# Patient Record
Sex: Female | Born: 1975 | Race: Black or African American | Hispanic: No | Marital: Single | Smoking: Former smoker
Health system: Southern US, Community
[De-identification: ages and names within clinical notes are randomized; demographics above are authoritative.]

## PROBLEM LIST (undated history)

## (undated) DIAGNOSIS — I499 Cardiac arrhythmia, unspecified: Secondary | ICD-10-CM

## (undated) DIAGNOSIS — C539 Malignant neoplasm of cervix uteri, unspecified: Secondary | ICD-10-CM

## (undated) DIAGNOSIS — D649 Anemia, unspecified: Secondary | ICD-10-CM

## (undated) DIAGNOSIS — I10 Essential (primary) hypertension: Secondary | ICD-10-CM

## (undated) DIAGNOSIS — I428 Other cardiomyopathies: Secondary | ICD-10-CM

## (undated) DIAGNOSIS — IMO0001 Reserved for inherently not codable concepts without codable children: Secondary | ICD-10-CM

## (undated) DIAGNOSIS — Z9189 Other specified personal risk factors, not elsewhere classified: Secondary | ICD-10-CM

## (undated) DIAGNOSIS — I509 Heart failure, unspecified: Secondary | ICD-10-CM

## (undated) DIAGNOSIS — IMO0002 Reserved for concepts with insufficient information to code with codable children: Secondary | ICD-10-CM

## (undated) DIAGNOSIS — Z9289 Personal history of other medical treatment: Secondary | ICD-10-CM

## (undated) HISTORY — PX: OTHER SURGICAL HISTORY: SHX169

## (undated) HISTORY — DX: Reserved for concepts with insufficient information to code with codable children: IMO0002

## (undated) HISTORY — DX: Reserved for inherently not codable concepts without codable children: IMO0001

---

## 2004-09-23 HISTORY — PX: CHOLECYSTECTOMY: SHX55

## 2006-08-25 ENCOUNTER — Emergency Department (HOSPITAL_COMMUNITY): Admission: EM | Admit: 2006-08-25 | Discharge: 2006-08-25 | Payer: Self-pay | Admitting: Family Medicine

## 2006-08-29 ENCOUNTER — Inpatient Hospital Stay (HOSPITAL_COMMUNITY): Admission: AD | Admit: 2006-08-29 | Discharge: 2006-08-29 | Payer: Self-pay | Admitting: Obstetrics & Gynecology

## 2006-10-03 ENCOUNTER — Inpatient Hospital Stay (HOSPITAL_COMMUNITY): Admission: AD | Admit: 2006-10-03 | Discharge: 2006-10-03 | Payer: Self-pay | Admitting: Gynecology

## 2006-11-14 ENCOUNTER — Ambulatory Visit (HOSPITAL_COMMUNITY): Admission: RE | Admit: 2006-11-14 | Discharge: 2006-11-14 | Payer: Self-pay | Admitting: Obstetrics

## 2006-12-06 ENCOUNTER — Inpatient Hospital Stay (HOSPITAL_COMMUNITY): Admission: AD | Admit: 2006-12-06 | Discharge: 2006-12-06 | Payer: Self-pay | Admitting: Obstetrics

## 2006-12-11 ENCOUNTER — Emergency Department (HOSPITAL_COMMUNITY): Admission: EM | Admit: 2006-12-11 | Discharge: 2006-12-11 | Payer: Self-pay | Admitting: Family Medicine

## 2007-01-23 ENCOUNTER — Ambulatory Visit (HOSPITAL_COMMUNITY): Admission: RE | Admit: 2007-01-23 | Discharge: 2007-01-23 | Payer: Self-pay | Admitting: Obstetrics

## 2007-02-09 ENCOUNTER — Inpatient Hospital Stay (HOSPITAL_COMMUNITY): Admission: AD | Admit: 2007-02-09 | Discharge: 2007-02-09 | Payer: Self-pay | Admitting: Obstetrics

## 2007-03-13 ENCOUNTER — Inpatient Hospital Stay (HOSPITAL_COMMUNITY): Admission: AD | Admit: 2007-03-13 | Discharge: 2007-03-13 | Payer: Self-pay | Admitting: Obstetrics & Gynecology

## 2007-03-15 ENCOUNTER — Inpatient Hospital Stay (HOSPITAL_COMMUNITY): Admission: AD | Admit: 2007-03-15 | Discharge: 2007-03-15 | Payer: Self-pay | Admitting: Obstetrics

## 2007-03-20 ENCOUNTER — Inpatient Hospital Stay (HOSPITAL_COMMUNITY): Admission: AD | Admit: 2007-03-20 | Discharge: 2007-03-22 | Payer: Self-pay | Admitting: Obstetrics & Gynecology

## 2008-01-10 ENCOUNTER — Emergency Department (HOSPITAL_COMMUNITY): Admission: EM | Admit: 2008-01-10 | Discharge: 2008-01-10 | Payer: Self-pay | Admitting: Family Medicine

## 2008-04-21 ENCOUNTER — Ambulatory Visit (HOSPITAL_COMMUNITY): Admission: RE | Admit: 2008-04-21 | Discharge: 2008-04-21 | Payer: Self-pay | Admitting: Obstetrics

## 2008-08-05 ENCOUNTER — Inpatient Hospital Stay (HOSPITAL_COMMUNITY): Admission: AD | Admit: 2008-08-05 | Discharge: 2008-08-08 | Payer: Self-pay | Admitting: Obstetrics

## 2008-08-07 ENCOUNTER — Encounter: Payer: Self-pay | Admitting: Obstetrics

## 2008-08-15 ENCOUNTER — Inpatient Hospital Stay (HOSPITAL_COMMUNITY): Admission: AD | Admit: 2008-08-15 | Discharge: 2008-08-18 | Payer: Self-pay | Admitting: Obstetrics

## 2010-05-11 ENCOUNTER — Emergency Department (HOSPITAL_COMMUNITY): Admission: EM | Admit: 2010-05-11 | Discharge: 2010-05-11 | Payer: Self-pay | Admitting: Family Medicine

## 2010-06-25 ENCOUNTER — Emergency Department (HOSPITAL_COMMUNITY): Admission: EM | Admit: 2010-06-25 | Discharge: 2010-06-26 | Payer: Self-pay | Admitting: Emergency Medicine

## 2010-07-16 ENCOUNTER — Ambulatory Visit (HOSPITAL_COMMUNITY): Admission: RE | Admit: 2010-07-16 | Discharge: 2010-07-16 | Payer: Self-pay | Admitting: Obstetrics

## 2010-07-19 ENCOUNTER — Other Ambulatory Visit: Payer: Self-pay | Admitting: Emergency Medicine

## 2010-07-20 ENCOUNTER — Inpatient Hospital Stay (HOSPITAL_COMMUNITY): Admission: AD | Admit: 2010-07-20 | Discharge: 2010-07-23 | Payer: Self-pay | Admitting: Obstetrics

## 2010-07-26 ENCOUNTER — Ambulatory Visit: Admission: RE | Admit: 2010-07-26 | Discharge: 2010-07-26 | Payer: Self-pay | Admitting: Gynecologic Oncology

## 2010-07-29 ENCOUNTER — Emergency Department (HOSPITAL_COMMUNITY): Admission: EM | Admit: 2010-07-29 | Discharge: 2010-07-29 | Payer: Self-pay | Admitting: Emergency Medicine

## 2010-07-31 ENCOUNTER — Ambulatory Visit
Admission: RE | Admit: 2010-07-31 | Discharge: 2010-10-15 | Payer: Self-pay | Source: Home / Self Care | Attending: Radiation Oncology | Admitting: Radiation Oncology

## 2010-08-06 ENCOUNTER — Ambulatory Visit (HOSPITAL_COMMUNITY)
Admission: RE | Admit: 2010-08-06 | Discharge: 2010-08-06 | Payer: Self-pay | Source: Home / Self Care | Admitting: Radiation Oncology

## 2010-08-10 ENCOUNTER — Other Ambulatory Visit: Payer: Self-pay | Admitting: Oncology

## 2010-08-10 LAB — CBC WITH DIFFERENTIAL/PLATELET
Basophils Absolute: 0 10*3/uL (ref 0.0–0.1)
Eosinophils Absolute: 0.3 10*3/uL (ref 0.0–0.5)
HGB: 9.3 g/dL — ABNORMAL LOW (ref 11.6–15.9)
LYMPH%: 23.7 % (ref 14.0–49.7)
MCV: 86.8 fL (ref 79.5–101.0)
MONO#: 1.3 10*3/uL — ABNORMAL HIGH (ref 0.1–0.9)
MONO%: 10.2 % (ref 0.0–14.0)
NEUT#: 8.1 10*3/uL — ABNORMAL HIGH (ref 1.5–6.5)
Platelets: 430 10*3/uL — ABNORMAL HIGH (ref 145–400)
RBC: 3.3 10*6/uL — ABNORMAL LOW (ref 3.70–5.45)
WBC: 12.9 10*3/uL — ABNORMAL HIGH (ref 3.9–10.3)

## 2010-08-10 LAB — COMPREHENSIVE METABOLIC PANEL
Albumin: 4.3 g/dL (ref 3.5–5.2)
Alkaline Phosphatase: 75 U/L (ref 39–117)
BUN: 7 mg/dL (ref 6–23)
CO2: 25 mEq/L (ref 19–32)
Glucose, Bld: 86 mg/dL (ref 70–99)
Potassium: 4.2 mEq/L (ref 3.5–5.3)
Sodium: 136 mEq/L (ref 135–145)
Total Protein: 7.3 g/dL (ref 6.0–8.3)

## 2010-08-10 LAB — MAGNESIUM: Magnesium: 2 mg/dL (ref 1.5–2.5)

## 2010-08-20 ENCOUNTER — Other Ambulatory Visit: Payer: Self-pay | Admitting: Oncology

## 2010-08-20 LAB — CBC WITH DIFFERENTIAL/PLATELET
BASO%: 0.6 % (ref 0.0–2.0)
Eosinophils Absolute: 0.6 10*3/uL — ABNORMAL HIGH (ref 0.0–0.5)
MCHC: 32 g/dL (ref 31.5–36.0)
MONO#: 0.6 10*3/uL (ref 0.1–0.9)
NEUT#: 8.2 10*3/uL — ABNORMAL HIGH (ref 1.5–6.5)
RBC: 3.26 10*6/uL — ABNORMAL LOW (ref 3.70–5.45)
RDW: 16.2 % — ABNORMAL HIGH (ref 11.2–14.5)
WBC: 10.5 10*3/uL — ABNORMAL HIGH (ref 3.9–10.3)
lymph#: 1 10*3/uL (ref 0.9–3.3)

## 2010-08-22 ENCOUNTER — Other Ambulatory Visit: Payer: Self-pay | Admitting: Oncology

## 2010-08-22 ENCOUNTER — Encounter (HOSPITAL_COMMUNITY)
Admission: RE | Admit: 2010-08-22 | Discharge: 2010-09-21 | Payer: Self-pay | Source: Home / Self Care | Attending: Oncology | Admitting: Oncology

## 2010-08-22 LAB — BASIC METABOLIC PANEL
CO2: 24 mEq/L (ref 19–32)
Calcium: 10.1 mg/dL (ref 8.4–10.5)
Chloride: 102 mEq/L (ref 96–112)
Glucose, Bld: 94 mg/dL (ref 70–99)
Potassium: 3.9 mEq/L (ref 3.5–5.3)
Sodium: 141 mEq/L (ref 135–145)

## 2010-08-23 LAB — TYPE & CROSSMATCH - CHCC

## 2010-08-24 LAB — URINALYSIS, MICROSCOPIC - CHCC
Bilirubin (Urine): NEGATIVE
Glucose: NEGATIVE g/dL
Ketones: NEGATIVE mg/dL
Nitrite: NEGATIVE
Protein: NEGATIVE mg/dL
Specific Gravity, Urine: 1.005 (ref 1.003–1.035)
pH: 8 (ref 4.6–8.0)

## 2010-08-26 LAB — URINE CULTURE

## 2010-08-29 ENCOUNTER — Ambulatory Visit: Payer: Self-pay | Admitting: Oncology

## 2010-08-29 LAB — CBC WITH DIFFERENTIAL/PLATELET
BASO%: 0.4 % (ref 0.0–2.0)
Basophils Absolute: 0.1 10*3/uL (ref 0.0–0.1)
EOS%: 13.3 % — ABNORMAL HIGH (ref 0.0–7.0)
Eosinophils Absolute: 1.5 10*3/uL — ABNORMAL HIGH (ref 0.0–0.5)
HCT: 30.7 % — ABNORMAL LOW (ref 34.8–46.6)
HGB: 10.2 g/dL — ABNORMAL LOW (ref 11.6–15.9)
LYMPH%: 4.5 % — ABNORMAL LOW (ref 14.0–49.7)
MCH: 28.2 pg (ref 25.1–34.0)
MCHC: 33.2 g/dL (ref 31.5–36.0)
MCV: 84.8 fL (ref 79.5–101.0)
MONO#: 0.8 10*3/uL (ref 0.1–0.9)
MONO%: 6.6 % (ref 0.0–14.0)
NEUT#: 8.6 10*3/uL — ABNORMAL HIGH (ref 1.5–6.5)
NEUT%: 75.2 % (ref 38.4–76.8)
Platelets: 353 10*3/uL (ref 145–400)
RBC: 3.62 10*6/uL — ABNORMAL LOW (ref 3.70–5.45)
RDW: 16.6 % — ABNORMAL HIGH (ref 11.2–14.5)
WBC: 11.4 10*3/uL — ABNORMAL HIGH (ref 3.9–10.3)
lymph#: 0.5 10*3/uL — ABNORMAL LOW (ref 0.9–3.3)

## 2010-08-31 LAB — BASIC METABOLIC PANEL
BUN: 9 mg/dL (ref 6–23)
CO2: 22 mEq/L (ref 19–32)
Calcium: 10.1 mg/dL (ref 8.4–10.5)
Chloride: 106 mEq/L (ref 96–112)
Creatinine, Ser: 0.72 mg/dL (ref 0.40–1.20)
Glucose, Bld: 95 mg/dL (ref 70–99)
Potassium: 3.8 mEq/L (ref 3.5–5.3)
Sodium: 139 mEq/L (ref 135–145)

## 2010-08-31 LAB — MAGNESIUM: Magnesium: 1.7 mg/dL (ref 1.5–2.5)

## 2010-09-03 LAB — CBC WITH DIFFERENTIAL/PLATELET
BASO%: 0.8 % (ref 0.0–2.0)
Basophils Absolute: 0.1 10*3/uL (ref 0.0–0.1)
EOS%: 17.4 % — ABNORMAL HIGH (ref 0.0–7.0)
Eosinophils Absolute: 1.6 10*3/uL — ABNORMAL HIGH (ref 0.0–0.5)
HCT: 28.3 % — ABNORMAL LOW (ref 34.8–46.6)
HGB: 9.4 g/dL — ABNORMAL LOW (ref 11.6–15.9)
LYMPH%: 7.6 % — ABNORMAL LOW (ref 14.0–49.7)
MCH: 28 pg (ref 25.1–34.0)
MCHC: 33.2 g/dL (ref 31.5–36.0)
MCV: 84.2 fL (ref 79.5–101.0)
MONO#: 0.7 10*3/uL (ref 0.1–0.9)
MONO%: 7.8 % (ref 0.0–14.0)
NEUT#: 6 10*3/uL (ref 1.5–6.5)
NEUT%: 66.4 % (ref 38.4–76.8)
Platelets: 317 10*3/uL (ref 145–400)
RBC: 3.36 10*6/uL — ABNORMAL LOW (ref 3.70–5.45)
RDW: 16 % — ABNORMAL HIGH (ref 11.2–14.5)
WBC: 9.1 10*3/uL (ref 3.9–10.3)
lymph#: 0.7 10*3/uL — ABNORMAL LOW (ref 0.9–3.3)

## 2010-09-07 LAB — CBC WITH DIFFERENTIAL/PLATELET
Basophils Absolute: 0.1 10*3/uL (ref 0.0–0.1)
Eosinophils Absolute: 0.9 10*3/uL — ABNORMAL HIGH (ref 0.0–0.5)
HGB: 9.5 g/dL — ABNORMAL LOW (ref 11.6–15.9)
MCV: 88.6 fL (ref 79.5–101.0)
MONO#: 0.6 10*3/uL (ref 0.1–0.9)
MONO%: 8.5 % (ref 0.0–14.0)
NEUT#: 5.2 10*3/uL (ref 1.5–6.5)
Platelets: 344 10*3/uL (ref 145–400)
RDW: 18 % — ABNORMAL HIGH (ref 11.2–14.5)

## 2010-09-07 LAB — BASIC METABOLIC PANEL
CO2: 25 mEq/L (ref 19–32)
Calcium: 10.1 mg/dL (ref 8.4–10.5)
Chloride: 104 mEq/L (ref 96–112)
Sodium: 137 mEq/L (ref 135–145)

## 2010-09-10 LAB — CBC WITH DIFFERENTIAL/PLATELET
BASO%: 0.7 % (ref 0.0–2.0)
Basophils Absolute: 0.1 10*3/uL (ref 0.0–0.1)
Eosinophils Absolute: 0.5 10*3/uL (ref 0.0–0.5)
HCT: 30.2 % — ABNORMAL LOW (ref 34.8–46.6)
HGB: 9.9 g/dL — ABNORMAL LOW (ref 11.6–15.9)
LYMPH%: 10 % — ABNORMAL LOW (ref 14.0–49.7)
MCHC: 32.8 g/dL (ref 31.5–36.0)
MONO#: 0.3 10*3/uL (ref 0.1–0.9)
NEUT%: 77.8 % — ABNORMAL HIGH (ref 38.4–76.8)
Platelets: 292 10*3/uL (ref 145–400)
WBC: 6.8 10*3/uL (ref 3.9–10.3)

## 2010-09-19 LAB — CBC WITH DIFFERENTIAL/PLATELET
Basophils Absolute: 0 10*3/uL (ref 0.0–0.1)
Eosinophils Absolute: 0.1 10*3/uL (ref 0.0–0.5)
HGB: 11.8 g/dL (ref 11.6–15.9)
LYMPH%: 12.7 % — ABNORMAL LOW (ref 14.0–49.7)
MCV: 87.7 fL (ref 79.5–101.0)
MONO%: 11.1 % (ref 0.0–14.0)
NEUT#: 3.1 10*3/uL (ref 1.5–6.5)
NEUT%: 73.6 % (ref 38.4–76.8)
Platelets: 200 10*3/uL (ref 145–400)

## 2010-09-19 LAB — MAGNESIUM: Magnesium: 1.8 mg/dL (ref 1.5–2.5)

## 2010-09-19 LAB — COMPREHENSIVE METABOLIC PANEL
CO2: 23 mEq/L (ref 19–32)
Creatinine, Ser: 0.67 mg/dL (ref 0.40–1.20)
Glucose, Bld: 85 mg/dL (ref 70–99)
Total Bilirubin: 0.2 mg/dL — ABNORMAL LOW (ref 0.3–1.2)

## 2010-09-27 LAB — TYPE & CROSSMATCH - CHCC

## 2010-10-02 ENCOUNTER — Inpatient Hospital Stay (HOSPITAL_COMMUNITY)
Admission: AD | Admit: 2010-10-02 | Discharge: 2010-10-05 | Payer: Self-pay | Source: Home / Self Care | Attending: Radiation Oncology | Admitting: Radiation Oncology

## 2010-10-02 ENCOUNTER — Ambulatory Visit
Admission: RE | Admit: 2010-10-02 | Discharge: 2010-10-02 | Payer: Self-pay | Source: Home / Self Care | Attending: Radiation Oncology | Admitting: Radiation Oncology

## 2010-10-03 ENCOUNTER — Ambulatory Visit: Payer: Self-pay | Admitting: Oncology

## 2010-10-08 LAB — COMPREHENSIVE METABOLIC PANEL
ALT: 9 U/L (ref 0–35)
ALT: 8 U/L (ref 0–35)
AST: 14 U/L (ref 0–37)
AST: 11 U/L (ref 0–37)
Albumin: 4.1 g/dL (ref 3.5–5.2)
Albumin: 4 g/dL (ref 3.5–5.2)
Alkaline Phosphatase: 79 U/L (ref 39–117)
Alkaline Phosphatase: 59 U/L (ref 39–117)
BUN: 4 mg/dL — ABNORMAL LOW (ref 6–23)
BUN: 6 mg/dL (ref 6–23)
CO2: 23 mEq/L (ref 19–32)
CO2: 25 mEq/L (ref 19–32)
Calcium: 10.6 mg/dL — ABNORMAL HIGH (ref 8.4–10.5)
Calcium: 10.1 mg/dL (ref 8.4–10.5)
Chloride: 108 mEq/L (ref 96–112)
Chloride: 104 mEq/L (ref 96–112)
Creatinine, Ser: 0.65 mg/dL (ref 0.4–1.2)
Creatinine, Ser: 0.61 mg/dL (ref 0.40–1.20)
GFR calc Af Amer: >60 mL/min (ref >60)
GFR calc non Af Amer: >60 mL/min (ref >60)
Glucose, Bld: 89 mg/dL (ref 70–99)
Glucose, Bld: 149 mg/dL — ABNORMAL HIGH (ref 70–99)
Potassium: 3.3 mEq/L — ABNORMAL LOW (ref 3.5–5.1)
Potassium: 3.9 mEq/L (ref 3.5–5.3)
Sodium: 140 mEq/L (ref 135–145)
Sodium: 136 mEq/L (ref 135–145)
Total Bilirubin: 0.7 mg/dL (ref 0.3–1.2)
Total Bilirubin: 0.3 mg/dL (ref 0.3–1.2)
Total Protein: 7.7 g/dL (ref 6.0–8.3)
Total Protein: 6.8 g/dL (ref 6.0–8.3)

## 2010-10-08 LAB — APTT: aPTT: 38 seconds — ABNORMAL HIGH (ref 24–37)

## 2010-10-08 LAB — URINE MICROSCOPIC-ADD ON

## 2010-10-08 LAB — URINALYSIS, ROUTINE W REFLEX MICROSCOPIC
Bilirubin Urine: NEGATIVE
Hgb urine dipstick: NEGATIVE
Ketones, ur: NEGATIVE mg/dL
Nitrite: NEGATIVE
Protein, ur: NEGATIVE mg/dL
Specific Gravity, Urine: 1.018 (ref 1.005–1.030)
Urine Glucose, Fasting: NEGATIVE mg/dL
Urobilinogen, UA: 0.2 mg/dL (ref 0.0–1.0)
pH: 6 (ref 5.0–8.0)

## 2010-10-08 LAB — CBC WITH DIFFERENTIAL/PLATELET
BASO%: 0.3 % (ref 0.0–2.0)
Basophils Absolute: 0 10*3/uL (ref 0.0–0.1)
EOS%: 1.8 % (ref 0.0–7.0)
Eosinophils Absolute: 0.1 10*3/uL (ref 0.0–0.5)
HCT: 33.5 % — ABNORMAL LOW (ref 34.8–46.6)
HGB: 11.1 g/dL — ABNORMAL LOW (ref 11.6–15.9)
LYMPH%: 17.3 % (ref 14.0–49.7)
MCH: 30.1 pg (ref 25.1–34.0)
MCHC: 33.1 g/dL (ref 31.5–36.0)
MCV: 90.8 fL (ref 79.5–101.0)
MONO#: 0.5 10*3/uL (ref 0.1–0.9)
MONO%: 14 % (ref 0.0–14.0)
NEUT#: 2.3 10*3/uL (ref 1.5–6.5)
NEUT%: 66.6 % (ref 38.4–76.8)
Platelets: 250 10*3/uL (ref 145–400)
RBC: 3.69 10*6/uL — ABNORMAL LOW (ref 3.70–5.45)
RDW: 22.2 % — ABNORMAL HIGH (ref 11.2–14.5)
WBC: 3.4 10*3/uL — ABNORMAL LOW (ref 3.9–10.3)
lymph#: 0.6 10*3/uL — ABNORMAL LOW (ref 0.9–3.3)
nRBC: 0 % (ref 0–0)

## 2010-10-08 LAB — DIFFERENTIAL
Basophils Absolute: 0 10*3/uL (ref 0.0–0.1)
Basophils Relative: 0 % (ref 0–1)
Eosinophils Absolute: 0 10*3/uL (ref 0.0–0.7)
Eosinophils Relative: 1 % (ref 0–5)
Lymphocytes Relative: 14 % (ref 12–46)
Lymphs Abs: 0.5 10*3/uL — ABNORMAL LOW (ref 0.7–4.0)
Monocytes Absolute: 0.5 10*3/uL (ref 0.1–1.0)
Monocytes Relative: 14 % — ABNORMAL HIGH (ref 3–12)
Neutro Abs: 2.5 10*3/uL (ref 1.7–7.7)
Neutrophils Relative %: 71 % (ref 43–77)

## 2010-10-08 LAB — MAGNESIUM: Magnesium: 1.9 mg/dL (ref 1.5–2.5)

## 2010-10-08 LAB — CBC
HCT: 36.9 % (ref 36.0–46.0)
Hemoglobin: 12.1 g/dL (ref 12.0–15.0)
MCH: 30 pg (ref 26.0–34.0)
MCHC: 32.8 g/dL (ref 30.0–36.0)
MCV: 91.3 fL (ref 78.0–100.0)
Platelets: 238 10*3/uL (ref 150–400)
RBC: 4.04 MIL/uL (ref 3.87–5.11)
RDW: 21.6 % — ABNORMAL HIGH (ref 11.5–15.5)
WBC: 3.5 10*3/uL — ABNORMAL LOW (ref 4.0–10.5)

## 2010-10-08 LAB — PROTIME-INR
INR: 1.11 (ref 0.00–1.49)
Prothrombin Time: 14.5 seconds (ref 11.6–15.2)

## 2010-10-13 ENCOUNTER — Encounter: Payer: Self-pay | Admitting: Obstetrics

## 2010-10-22 LAB — CBC WITH DIFFERENTIAL/PLATELET
BASO%: 0.2 % (ref 0.0–2.0)
Basophils Absolute: 0 10*3/uL (ref 0.0–0.1)
EOS%: 1.9 % (ref 0.0–7.0)
Eosinophils Absolute: 0.1 10*3/uL (ref 0.0–0.5)
HCT: 33.1 % — ABNORMAL LOW (ref 34.8–46.6)
HGB: 11.2 g/dL — ABNORMAL LOW (ref 11.6–15.9)
LYMPH%: 9.3 % — ABNORMAL LOW (ref 14.0–49.7)
MCH: 32.1 pg (ref 25.1–34.0)
MCHC: 33.8 g/dL (ref 31.5–36.0)
MCV: 94.9 fL (ref 79.5–101.0)
MONO#: 0.6 10*3/uL (ref 0.1–0.9)
MONO%: 11 % (ref 0.0–14.0)
NEUT#: 4.1 10*3/uL (ref 1.5–6.5)
NEUT%: 77.6 % — ABNORMAL HIGH (ref 38.4–76.8)
Platelets: 287 10*3/uL (ref 145–400)
RBC: 3.48 10*6/uL — ABNORMAL LOW (ref 3.70–5.45)
RDW: 25 % — ABNORMAL HIGH (ref 11.2–14.5)
WBC: 5.3 10*3/uL (ref 3.9–10.3)
lymph#: 0.5 10*3/uL — ABNORMAL LOW (ref 0.9–3.3)

## 2010-10-22 LAB — BASIC METABOLIC PANEL
BUN: 10 mg/dL (ref 6–23)
CO2: 25 mEq/L (ref 19–32)
Calcium: 10.5 mg/dL (ref 8.4–10.5)
Chloride: 105 mEq/L (ref 96–112)
Creatinine, Ser: 0.7 mg/dL (ref 0.40–1.20)
Glucose, Bld: 93 mg/dL (ref 70–99)
Potassium: 3.9 mEq/L (ref 3.5–5.3)
Sodium: 140 mEq/L (ref 135–145)

## 2010-10-22 LAB — MAGNESIUM: Magnesium: 2 mg/dL (ref 1.5–2.5)

## 2010-10-24 ENCOUNTER — Ambulatory Visit: Payer: Self-pay | Admitting: Radiation Oncology

## 2010-10-24 ENCOUNTER — Encounter: Payer: Self-pay | Admitting: Radiation Oncology

## 2010-11-29 ENCOUNTER — Ambulatory Visit: Payer: Medicaid Other | Attending: Radiation Oncology | Admitting: Radiation Oncology

## 2010-12-03 LAB — CROSSMATCH: Unit division: 0

## 2010-12-04 LAB — URINALYSIS, ROUTINE W REFLEX MICROSCOPIC
Bilirubin Urine: NEGATIVE
Glucose, UA: NEGATIVE mg/dL
Ketones, ur: NEGATIVE mg/dL
Nitrite: NEGATIVE
Protein, ur: NEGATIVE mg/dL
pH: 6.5 (ref 5.0–8.0)

## 2010-12-04 LAB — CROSSMATCH
Antibody Screen: NEGATIVE
Unit division: 0
Unit division: 0
Unit division: 0

## 2010-12-04 LAB — URINE MICROSCOPIC-ADD ON

## 2010-12-04 LAB — URINE CULTURE

## 2010-12-04 LAB — ABO/RH: ABO/RH(D): A POS

## 2010-12-05 LAB — CBC
HCT: 31.8 % — ABNORMAL LOW (ref 36.0–46.0)
RBC: 3.68 MIL/uL — ABNORMAL LOW (ref 3.87–5.11)
RDW: 15.7 % — ABNORMAL HIGH (ref 11.5–15.5)
WBC: 12.9 10*3/uL — ABNORMAL HIGH (ref 4.0–10.5)

## 2010-12-05 LAB — URINALYSIS, ROUTINE W REFLEX MICROSCOPIC
Nitrite: NEGATIVE
Protein, ur: NEGATIVE mg/dL
Urobilinogen, UA: 1 mg/dL (ref 0.0–1.0)

## 2010-12-05 LAB — BASIC METABOLIC PANEL
BUN: 3 mg/dL — ABNORMAL LOW (ref 6–23)
GFR calc Af Amer: >60 mL/min (ref >60)
GFR calc non Af Amer: >60 mL/min (ref >60)
Potassium: 3.3 mEq/L — ABNORMAL LOW (ref 3.5–5.1)
Sodium: 136 mEq/L (ref 135–145)

## 2010-12-05 LAB — URINE CULTURE: Colony Count: 40000

## 2010-12-05 LAB — DIFFERENTIAL
Basophils Absolute: 0.1 10*3/uL (ref 0.0–0.1)
Lymphocytes Relative: 23 % (ref 12–46)
Monocytes Absolute: 0.9 10*3/uL (ref 0.1–1.0)
Neutro Abs: 8.7 10*3/uL — ABNORMAL HIGH (ref 1.7–7.7)

## 2010-12-05 LAB — TYPE AND SCREEN: ABO/RH(D): A POS

## 2010-12-05 LAB — URINE MICROSCOPIC-ADD ON

## 2010-12-05 LAB — PROTIME-INR: Prothrombin Time: 14.7 seconds (ref 11.6–15.2)

## 2010-12-05 LAB — APTT: aPTT: 35 seconds (ref 24–37)

## 2010-12-05 LAB — ABO/RH: ABO/RH(D): A POS

## 2010-12-06 LAB — URINALYSIS, ROUTINE W REFLEX MICROSCOPIC
Glucose, UA: NEGATIVE mg/dL
Glucose, UA: NEGATIVE mg/dL
Hgb urine dipstick: NEGATIVE
Protein, ur: NEGATIVE mg/dL
Specific Gravity, Urine: 1.018 (ref 1.005–1.030)
Urobilinogen, UA: 0.2 mg/dL (ref 0.0–1.0)

## 2010-12-06 LAB — WET PREP, GENITAL
Clue Cells Wet Prep HPF POC: NONE SEEN
Trich, Wet Prep: NONE SEEN
Yeast Wet Prep HPF POC: NONE SEEN

## 2010-12-06 LAB — URINE MICROSCOPIC-ADD ON

## 2010-12-06 LAB — GC/CHLAMYDIA PROBE AMP, GENITAL: GC Probe Amp, Genital: NEGATIVE

## 2011-01-03 ENCOUNTER — Ambulatory Visit: Payer: Medicaid Other | Attending: Radiation Oncology | Admitting: Radiation Oncology

## 2011-01-07 NOTE — Op Note (Signed)
  NAMEJEARLEAN, Vanessa [name]            ACCOUNT NO.:  [id number]  MEDICAL RECORD NO.:  [id number]          PATIENT TYPE:  AMB  LOCATION:  NESC                         FACILITY:  [facility]  PHYSICIAN:  Artist Pais. 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  PROCEDURE PERFORMED:  Placement of Fletcher-suit applicator for intracavitary low-dose-rate brachytherapy.  SURGEON:  Artist Pais. Kathrynn Running, M.D.  FINDINGS:  The patient was brought to the operating room.  She was placed in the dorsal lithotomy position.  The perineum was prepped and draped.  A Foley catheter was placed with 7 cc of half contrast.  Then, a bimanual exam was performed revealing residual polypoid mass within the cervix, measuring perhaps 4 cm.  The cervical os was identified and sounded to 5.5 cm.  Then the cervical os was sequentially dilated.  Then the flange was placed with the #3 curved tandem and this was positioned under ultrasound guidance.  Then the medium-sized caps were attempted on the ovoids and these were not a good fit for the upper vaginal canal. The smaller caps were then applied and placed uneventfully.  The positioning of the tandem and devices was optimized and locked into position.  Then Estrace-soaked vaginal packing was applied posteriorly and anteriorly down the vaginal canal and the labia were secured with silk suture.  A rectal tube was placed for future contrast administration.  Then the patient was placed in the supine position for radiographs.  The anterior and lateral view showed appropriate geometry for brachytherapy.  Then pictures with magnification rings were obtained for potential radiation planning.  The patient was awakened in stable condition and  transferred to the PACU for intracavitary brachytherapy as an inpatient.  PLAN:  The patient will be transferred to the Radiation Oncology Clinic when she is stable following PACU evaluation.  Then, she will undergo megavoltage CT imaging on the tomotherapy unit to provide visualization of the tungsten shielded Fletcher-suit applicators and three dimensional anatomy of the pelvis.  This will be used for radiation planning to deliver a point A dose of between 80 Gy and 90 Gy in conjunction with her external beam radiation treatment.          ______________________________ Artist Pais. Kathrynn Running, M.D.     MAM/MEDQ  D:  10/02/2010  T:  10/02/2010  Job:  [id number]  cc:   Laurette Schimke, MD  Juanita Craver. Darrold Span, M.D. Fax: [phone]  Electronically Signed by Margaretmary Dys M.D. on 01/07/2011 11:25:59 AM

## 2011-01-24 NOTE — Discharge Summary (Signed)
  [name], Vanessa [name]            ACCOUNT NO.:  [id number]  MEDICAL RECORD NO.:  [id number]          PATIENT TYPE:  INP  LOCATION:  1320                         FACILITY:  [facility]  PHYSICIAN:  Artist Pais. Kathrynn Running, M.D.DATE OF BIRTH:  [birth year]  DATE OF ADMISSION:  10/02/2010 DATE OF DISCHARGE:  10/05/2010                              DISCHARGE SUMMARY   DIAGNOSIS:  A 35 year old woman with stage IB-2 squamous cell carcinoma of the cervix.  REASON FOR HOSPITALIZATION:  The patient was hospitalized for low-dose brachytherapy in the management of her cervical cancer.  PROCEDURE PERFORMED:  Ms. Doane was admitted on January 10 and was taken to the operating room for placement of a Fletcher-Suit applicator for intracavitary low-dose brachytherapy.  The procedure was uneventful. The uterus was sounded to 5.5 cm and the patient was implanted with the tandem and ovoid devices.    HOSPITAL COURSE:  She subsequently proceeded to the Radiation/Oncology Clinic for CT-based treatment planning.  The tomotherapy mega voltage CT imager was required secondary to shielding with afterloading devices.  Using this approach, a radiation treatment plan was generated with 20 mg Raeq sources in each ovoid.  The tandem was loaded with a 20 mg Raeq source at the tip followed by 15 mg Raeq source, 10 mg Raeq source for a total of 5 radioactive sources.  The radioactive sources were placed on January 10 at 3:42 p.m. and removed on January 13 at 5:15 p.m.  The patient's hospital stay was relatively uneventful.  She experienced minor discomfort upon device removal.  DISPOSITION:  The patient was discharged to home in stable condition and return to Radiation/Oncology Clinic for routine followup in 1 month.          ______________________________ Artist Pais. Kathrynn Running, M.D.     MAM/MEDQ  D:  01/14/2011  T:  01/14/2011  Job:  [id number]  Electronically Signed by Margaretmary Dys M.D. on 01/24/2011 10:52:28  AM

## 2011-02-05 NOTE — H&P (Signed)
Vanessa [name], Vanessa [name] NO.:  [id number]   MEDICAL RECORD NO.:  [id number]          PATIENT TYPE:  INP   LOCATION:  9166                          FACILITY:  [facility]   PHYSICIAN:  Roseanna Rainbow, M.D.DATE OF BIRTH:  29-Sep-1975   DATE OF ADMISSION:  03/20/2007  DATE OF DISCHARGE:                              HISTORY & PHYSICAL   CHIEF COMPLAINT:  The patient is a 35 year old para 6 with an estimated  date of confinement of March 24, 2007 with an intrauterine pregnancy at  39+ weeks complaining of uterine contractions.   HISTORY OF PRESENT ILLNESS:  Please see the above.   ALLERGIES:  MORPHINE.   MEDICATIONS:  See the medication reconciliation form.   RISK FACTORS:  Tobacco use.   PRENATAL SCREENING:  Quad screen, Down's syndrome screen negative.   Hematocrit 32.8, hemoglobin 11.1, platelets 328,000.  Chlamydia probe  negative.  Urine culture and sensitivity, no uropathogen.  One hour GCG  87.  GC probe negative.  Hepatitis B surface antigen negative.  HIV  nonreactive.  Blood type is A+, antibody screen negative.  Rubella  immune.  Sickle cell negative.   PAST GYN HISTORY:  Noncontributory.   PAST MEDICAL HISTORY:  Herniated disk.   PAST SURGICAL HISTORY:  Cholecystectomy.   PAST OB HISTORY:  She has had six NSVD's.   SOCIAL HISTORY:  Tobacco use current.  History of marijuana use.   PHYSICAL EXAMINATION:  VITAL SIGNS:  Stable.  Afebrile.  Fetal heart  tracing reassuring.  Tocodynometer:  Uterine contractions every two  minutes.  PELVIC:  Sterile vaginal exam per the RN.  The cervix is 6 cm dilated,  bulging bag of water.   ASSESSMENT:  Grand multipara at term.  Active labor.  Fetal heart  tracing consistent with fetal well-being.   PLAN:  Admission.  Expectant management.      Roseanna Rainbow, M.D.  Electronically Signed     LAJ/MEDQ  D:  03/20/2007  T:  03/20/2007  Job:  [id number]

## 2011-02-05 NOTE — Op Note (Signed)
[name], Vanessa [name]            ACCOUNT NO.:  [id number]   MEDICAL RECORD NO.:  [id number]          PATIENT TYPE:  INP   LOCATION:  9372                          [facility]ACILITY[facility]  [facility]   PHYSICIAN:  Charles A. Clearance Coots, M.D.DATE OF BIRTH:  [birth year]   DATE OF PROCEDURE:  08/07/2008  DATE OF DISCHARGE:  08/18/2008                               OPERATIVE REPORT   PREOPERATIVE DIAGNOSIS:  Desires sterilization.   POSTOPERATIVE DIAGNOSIS:  Desires sterilization.   PROCEDURE:  Bilateral partial salpingectomy.   SURGEON:  Charles A. Clearance Coots, MD   ANESTHESIA:  General.   ESTIMATED BLOOD LOSS:  Negligible.   COMPLICATIONS:  None.   SPECIMEN:  Approximately 2 cm segments of right and left fallopian tube.   DISPOSITION:  Specimen to pathology.   DESCRIPTION OF PROCEDURE:  The patient was brought to the operating  room, and after satisfactory general endotracheal anesthesia, the  abdomen was prepped and draped in the usual sterile fashion.  A small  inferior umbilical incision was made with a scalpel.  The incision was  then extended down to the fascia bluntly with curved Mayo scissors.  The  fascia was grasped in the midline with Kelly forceps and was cut  transversely with curved Mayo scissors.  The incision was extended to  left and to the right with curved Mayo scissors.  The peritoneum was  entered bluntly and the right-angle retractors were then placed in the  incision.  The left fallopian tube was identified and was grasped with a  Babcock clamp.  The tube was identified from the corneal end to the  fimbrial end and grasped with Babcock clamps.  A knuckle of tube beneath  the Babcock clamp and the isthmic area of the tube was then suture  ligated through the mesosalpinx with 0 plain catgut.  The section of  tube above the knot was excised with Metzenbaum scissors and submitted  to pathology for evaluation.  There is no active bleeding from the tubal  stumps, and it was, therefore,  placed back in their normal anatomic  position.  The same procedure was performed on the opposite side without  complications.  The abdomen was then closed as follows.  The peritoneum  and fascia were closed as one with a continuous suture of 2-0 Vicryl.  Skin was closed with continuous subcuticular suture of 4-0  Monocryl.  Sterile bandage was applied to the incision closure.  Surgical technician indicated that all needle, sponge, and instrument  counts were correct x2.  The patient tolerated the procedure well and  transported to the recovery room in satisfactory condition.      Charles A. Clearance Coots, M.D.  Electronically Signed     CAH/MEDQ  D:  09/30/2008  T:  10/01/2008  Job:  [id number]

## 2011-02-05 NOTE — Discharge Summary (Signed)
NAMESILVA, AAMODT            ACCOUNT NO.:  [id number]   MEDICAL RECORD NO.:  [id number]          PATIENT TYPE:  INP   LOCATION:  9372                          [facility]ILITY[facility]  [facility]   PHYSICIAN:  Charles A. Clearance Coots, M.D.DATE OF BIRTH:  [birth year]   DATE OF ADMISSION:  08/15/2008  DATE OF DISCHARGE:  08/18/2008                               DISCHARGE SUMMARY   ADMITTING DIAGNOSIS:  Postpartum preeclampsia.   DISCHARGE DIAGNOSIS:  Postpartum preeclampsia, improved after anti-  hypertensive therapy and supportive management.   Discharged home in good condition   REASON FOR ADMISSION:  A 35 year old G47, P56 black female status post  normal spontaneous vaginal delivery and postpartum tubal ligation on  August 07, 2007, presents with increased blood pressure.   PAST MEDICAL HISTORY/SURGERY:  None.   ILLNESSES:  None.   MEDICATIONS:  Prenatal vitamins.   ALLERGIES:  MORPHINE.   SOCIAL HISTORY:  Single.  Positive history of marijuana use.   PHYSICAL EXAMINATION:  VITAL SIGNS:  Afebrile, blood pressure 161/116.  LUNGS:  Clear to auscultation bilaterally.  HEART:  Regular rate and rhythm.  ABDOMEN:  Nontender.   LABORATORY DATA:  Admitting hemoglobin 10, hematocrit 31, platelets  390,000, white blood cell count 8400.  Chemistries, sodium 136,  potassium 4.1, BUN 6, creatinine 0.57.  Liver function studies within  normal limits.   HOSPITAL COURSE:  The patient was admitted and started on IV magnesium  sulfate prophylaxis to prevent seizure and was also started on p.o.  labetalol for blood pressure management.  The patient had a slow  response to blood pressure management and Norvasc and  hydrochlorothiazide was added to the labetalol on hospital day 3, and  the patient was discharged home with blood pressures that were beginning  to stabilize, but not optimal.  On labetalol, Norvasc, and  hydrochlorothiazide blood pressures will be followed up at home with  home health nurse  management.   DISCHARGE DISPOSITION:  Medications, labetalol 300 mg p.o. q.8 h.,  Norvasc 5 mg p.o. daily, hydrochlorothiazide 25 mg p.o. daily.  Routine  written instructions were given for preeclampsia precautions.  The  patient will follow up in the office in 1 week for blood pressure check  and will be followed up at home daily with home health nurse blood  pressure checks.      Charles A. Clearance Coots, M.D.  Electronically Signed     CAH/MEDQ  D:  08/18/2008  T:  08/18/2008  Job:  [id number]

## 2011-03-14 ENCOUNTER — Inpatient Hospital Stay (HOSPITAL_COMMUNITY)
Admission: AD | Admit: 2011-03-14 | Discharge: 2011-03-16 | DRG: 761 | Disposition: A | Discharge status: Home / Self Care | Payer: Medicaid Other | Source: Ambulatory Visit | Attending: Obstetrics | Admitting: Obstetrics

## 2011-03-14 ENCOUNTER — Emergency Department (HOSPITAL_COMMUNITY)
Admission: EM | Admit: 2011-03-14 | Discharge: 2011-03-14 | Disposition: A | Discharge status: Other Acute Inpatient Hospital | Payer: Medicaid Other | Source: Home / Self Care | Attending: Emergency Medicine | Admitting: Emergency Medicine

## 2011-03-14 DIAGNOSIS — C539 Malignant neoplasm of cervix uteri, unspecified: Secondary | ICD-10-CM | POA: Diagnosis present

## 2011-03-14 DIAGNOSIS — N920 Excessive and frequent menstruation with regular cycle: Principal | ICD-10-CM | POA: Diagnosis present

## 2011-03-14 LAB — CBC
Hemoglobin: 12 g/dL (ref 12.0–15.0)
Hemoglobin: 11.2 g/dL — ABNORMAL LOW (ref 12.0–15.0)
MCH: 31.5 pg (ref 26.0–34.0)
MCHC: 33.4 g/dL (ref 30.0–36.0)
MCV: 95.2 fL (ref 78.0–100.0)
Platelets: 275 10*3/uL (ref 150–400)
Platelets: 259 10*3/uL (ref 150–400)
RBC: 3.55 MIL/uL — ABNORMAL LOW (ref 3.87–5.11)
RDW: 14.2 % (ref 11.5–15.5)
WBC: 8.8 10*3/uL (ref 4.0–10.5)

## 2011-03-14 LAB — URINALYSIS, ROUTINE W REFLEX MICROSCOPIC
Bilirubin Urine: NEGATIVE
Ketones, ur: NEGATIVE mg/dL
Leukocytes, UA: NEGATIVE
Nitrite: NEGATIVE
Protein, ur: NEGATIVE mg/dL
Urobilinogen, UA: 2 mg/dL — ABNORMAL HIGH (ref 0.0–1.0)

## 2011-03-14 LAB — COMPREHENSIVE METABOLIC PANEL
AST: 19 U/L (ref 0–37)
Albumin: 4 g/dL (ref 3.5–5.2)
BUN: 6 mg/dL (ref 6–23)
Calcium: 11 mg/dL — ABNORMAL HIGH (ref 8.4–10.5)
Creatinine, Ser: 0.63 mg/dL (ref 0.50–1.10)
GFR calc non Af Amer: >60 mL/min (ref >60)

## 2011-03-14 LAB — POCT PREGNANCY, URINE: Preg Test, Ur: NEGATIVE

## 2011-03-14 LAB — DIFFERENTIAL
Basophils Absolute: 0 10*3/uL (ref 0.0–0.1)
Basophils Relative: 0 % (ref 0–1)
Eosinophils Absolute: 0.1 10*3/uL (ref 0.0–0.7)
Eosinophils Relative: 1 % (ref 0–5)
Monocytes Absolute: 1 10*3/uL (ref 0.1–1.0)
Neutro Abs: 5.7 10*3/uL (ref 1.7–7.7)

## 2011-03-15 LAB — CBC
HCT: 28.8 % — ABNORMAL LOW (ref 36.0–46.0)
Hemoglobin: 9.4 g/dL — ABNORMAL LOW (ref 12.0–15.0)
MCV: 96.6 fL (ref 78.0–100.0)
RDW: 14.8 % (ref 11.5–15.5)
WBC: 6.6 10*3/uL (ref 4.0–10.5)

## 2011-03-20 NOTE — Discharge Summary (Signed)
  NAMEREBECCA, [name] NO.:  [id number]  MEDICAL RECORD NO.:  [id number]  LOCATION:  9309                          FACILITY:  [facility]  PHYSICIAN:  Kathreen Cosier, M.D.DATE OF BIRTH:  06/02/1976  DATE OF ADMISSION:  03/14/2011 DATE OF DISCHARGE:  03/16/2011                              DISCHARGE SUMMARY   The patient is a 35 year old more gravida 31 para 7 patient of Dr. Clearance Coots with cancer of the cervix who has undergone chemotherapy and radiation and had intercourse 2 days prior to admission for the first time in 8 months, and she came to the emergency room with heavy vaginal bleeding with clots.  She was treated with Megace and IV Premarin, and the bleeding ceased.  She did well and was discharged home on March 16, 2011, ambulatory on a regular diet with no bleeding on Percocet 7.5/325 and Megace 40 b.i.d.          ______________________________ Kathreen Cosier, M.D.     BAM/MEDQ  D:  03/16/2011  T:  03/16/2011  Job:  [id number]  Electronically Signed by Francoise Ceo M.D. on 03/20/2011 09:18:31 AM

## 2011-04-03 ENCOUNTER — Ambulatory Visit
Admission: RE | Admit: 2011-04-03 | Discharge: 2011-04-03 | Disposition: A | Discharge status: Home / Self Care | Payer: Medicaid Other | Source: Ambulatory Visit | Attending: Radiation Oncology | Admitting: Radiation Oncology

## 2011-04-03 DIAGNOSIS — N930 Postcoital and contact bleeding: Secondary | ICD-10-CM | POA: Insufficient documentation

## 2011-04-03 DIAGNOSIS — C539 Malignant neoplasm of cervix uteri, unspecified: Secondary | ICD-10-CM | POA: Insufficient documentation

## 2011-04-03 DIAGNOSIS — N949 Unspecified condition associated with female genital organs and menstrual cycle: Secondary | ICD-10-CM | POA: Insufficient documentation

## 2011-04-04 ENCOUNTER — Other Ambulatory Visit: Payer: Self-pay | Admitting: Radiation Oncology

## 2011-04-04 DIAGNOSIS — C539 Malignant neoplasm of cervix uteri, unspecified: Secondary | ICD-10-CM

## 2011-04-11 ENCOUNTER — Other Ambulatory Visit (HOSPITAL_COMMUNITY): Payer: Medicaid Other

## 2011-04-12 ENCOUNTER — Ambulatory Visit: Payer: Medicaid Other | Attending: Radiation Oncology | Admitting: Radiation Oncology

## 2011-05-07 ENCOUNTER — Ambulatory Visit (HOSPITAL_COMMUNITY)
Admission: RE | Admit: 2011-05-07 | Discharge: 2011-05-07 | Disposition: A | Discharge status: Home / Self Care | Payer: Medicaid Other | Source: Ambulatory Visit | Attending: Radiation Oncology | Admitting: Radiation Oncology

## 2011-05-07 DIAGNOSIS — R599 Enlarged lymph nodes, unspecified: Secondary | ICD-10-CM | POA: Insufficient documentation

## 2011-05-07 DIAGNOSIS — R109 Unspecified abdominal pain: Secondary | ICD-10-CM | POA: Insufficient documentation

## 2011-05-07 DIAGNOSIS — Z9089 Acquired absence of other organs: Secondary | ICD-10-CM | POA: Insufficient documentation

## 2011-05-07 DIAGNOSIS — Z923 Personal history of irradiation: Secondary | ICD-10-CM | POA: Insufficient documentation

## 2011-05-07 DIAGNOSIS — N949 Unspecified condition associated with female genital organs and menstrual cycle: Secondary | ICD-10-CM | POA: Insufficient documentation

## 2011-05-07 DIAGNOSIS — C539 Malignant neoplasm of cervix uteri, unspecified: Secondary | ICD-10-CM | POA: Insufficient documentation

## 2011-05-07 DIAGNOSIS — N898 Other specified noninflammatory disorders of vagina: Secondary | ICD-10-CM | POA: Insufficient documentation

## 2011-05-07 DIAGNOSIS — Z9221 Personal history of antineoplastic chemotherapy: Secondary | ICD-10-CM | POA: Insufficient documentation

## 2011-05-07 MED ORDER — IOHEXOL 300 MG/ML  SOLN
100.0000 mL | Freq: Once | INTRAMUSCULAR | Status: AC | PRN
  Administered 2011-05-07: 100 mL via INTRAVENOUS

## 2011-05-16 ENCOUNTER — Ambulatory Visit
Admission: RE | Admit: 2011-05-16 | Discharge: 2011-05-16 | Disposition: A | Discharge status: Home / Self Care | Payer: Medicaid Other | Source: Ambulatory Visit | Attending: Radiation Oncology | Admitting: Radiation Oncology

## 2011-05-17 ENCOUNTER — Other Ambulatory Visit: Payer: Self-pay | Admitting: Radiation Oncology

## 2011-05-17 DIAGNOSIS — C539 Malignant neoplasm of cervix uteri, unspecified: Secondary | ICD-10-CM

## 2011-05-17 DIAGNOSIS — R102 Pelvic and perineal pain: Secondary | ICD-10-CM

## 2011-05-24 ENCOUNTER — Other Ambulatory Visit (HOSPITAL_COMMUNITY): Payer: Medicaid Other

## 2011-05-30 ENCOUNTER — Ambulatory Visit: Payer: Medicaid Other | Attending: Radiation Oncology | Admitting: Radiation Oncology

## 2011-06-20 ENCOUNTER — Other Ambulatory Visit: Payer: Self-pay | Admitting: Radiation Oncology

## 2011-06-20 DIAGNOSIS — C539 Malignant neoplasm of cervix uteri, unspecified: Secondary | ICD-10-CM

## 2011-06-25 LAB — URINALYSIS, ROUTINE W REFLEX MICROSCOPIC
Bilirubin Urine: NEGATIVE
Glucose, UA: NEGATIVE
Glucose, UA: NEGATIVE
Ketones, ur: NEGATIVE
Ketones, ur: NEGATIVE
Leukocytes, UA: NEGATIVE
Protein, ur: 30 — AB
Protein, ur: NEGATIVE
Urobilinogen, UA: 0.2
pH: 7

## 2011-06-25 LAB — CBC
HCT: 31 — ABNORMAL LOW
HCT: 32.5 — ABNORMAL LOW
Hemoglobin: 10.4 — ABNORMAL LOW
Hemoglobin: 11 — ABNORMAL LOW
Hemoglobin: 8.8 — ABNORMAL LOW
MCHC: 33.6
MCV: 94.1
MCV: 95.4
Platelets: 216
Platelets: 254
RBC: 3.46 — ABNORMAL LOW
RDW: 14.7
WBC: 8.4
WBC: 8.5

## 2011-06-25 LAB — COMPREHENSIVE METABOLIC PANEL
Albumin: 2.8 — ABNORMAL LOW
Alkaline Phosphatase: 100
Alkaline Phosphatase: 160 — ABNORMAL HIGH
BUN: 1 — ABNORMAL LOW
BUN: 6
CO2: 20
Chloride: 104
Chloride: 106
Creatinine, Ser: 0.46
Creatinine, Ser: 0.57
GFR calc non Af Amer: >60
GFR calc non Af Amer: >60
Glucose, Bld: 129 — ABNORMAL HIGH
Glucose, Bld: 90
Potassium: 3.1 — ABNORMAL LOW
Potassium: 4.1
Total Bilirubin: 0.3
Total Bilirubin: 0.6

## 2011-06-25 LAB — URINE MICROSCOPIC-ADD ON

## 2011-06-25 LAB — URIC ACID
Uric Acid, Serum: 3.6
Uric Acid, Serum: 6

## 2011-06-25 LAB — RPR: RPR Ser Ql: NONREACTIVE

## 2011-06-25 LAB — LACTATE DEHYDROGENASE: LDH: 195

## 2011-07-04 ENCOUNTER — Ambulatory Visit: Payer: Medicaid Other | Admitting: Radiation Oncology

## 2011-07-09 ENCOUNTER — Other Ambulatory Visit: Payer: Self-pay | Admitting: Radiation Oncology

## 2011-07-09 DIAGNOSIS — C539 Malignant neoplasm of cervix uteri, unspecified: Secondary | ICD-10-CM

## 2011-07-10 LAB — RPR: RPR Ser Ql: NONREACTIVE

## 2011-07-10 LAB — CBC
Platelets: 241
RDW: 15.3 — ABNORMAL HIGH

## 2011-07-18 ENCOUNTER — Other Ambulatory Visit (HOSPITAL_COMMUNITY): Payer: Medicaid Other

## 2011-07-19 ENCOUNTER — Ambulatory Visit: Payer: Medicaid Other | Admitting: Radiation Oncology

## 2011-07-25 ENCOUNTER — Other Ambulatory Visit (HOSPITAL_COMMUNITY): Payer: Medicaid Other

## 2011-11-13 ENCOUNTER — Emergency Department (HOSPITAL_COMMUNITY): Payer: Medicaid Other

## 2011-11-13 ENCOUNTER — Emergency Department (HOSPITAL_COMMUNITY)
Admission: EM | Admit: 2011-11-13 | Discharge: 2011-11-13 | Disposition: A | Discharge status: Home Health | Payer: Medicaid Other | Attending: Emergency Medicine | Admitting: Emergency Medicine

## 2011-11-13 ENCOUNTER — Encounter (HOSPITAL_COMMUNITY): Payer: Self-pay | Admitting: *Deleted

## 2011-11-13 DIAGNOSIS — K625 Hemorrhage of anus and rectum: Secondary | ICD-10-CM | POA: Insufficient documentation

## 2011-11-13 DIAGNOSIS — F17200 Nicotine dependence, unspecified, uncomplicated: Secondary | ICD-10-CM | POA: Insufficient documentation

## 2011-11-13 DIAGNOSIS — Z79899 Other long term (current) drug therapy: Secondary | ICD-10-CM | POA: Insufficient documentation

## 2011-11-13 DIAGNOSIS — Z8541 Personal history of malignant neoplasm of cervix uteri: Secondary | ICD-10-CM | POA: Insufficient documentation

## 2011-11-13 DIAGNOSIS — R1032 Left lower quadrant pain: Secondary | ICD-10-CM | POA: Insufficient documentation

## 2011-11-13 DIAGNOSIS — I10 Essential (primary) hypertension: Secondary | ICD-10-CM | POA: Insufficient documentation

## 2011-11-13 HISTORY — DX: Essential (primary) hypertension: I10

## 2011-11-13 HISTORY — DX: Malignant neoplasm of cervix uteri, unspecified: C53.9

## 2011-11-13 LAB — CBC
Hemoglobin: 14.4 g/dL (ref 12.0–15.0)
MCH: 31.6 pg (ref 26.0–34.0)
MCV: 91.9 fL (ref 78.0–100.0)
RBC: 4.56 MIL/uL (ref 3.87–5.11)
WBC: 6.5 10*3/uL (ref 4.0–10.5)

## 2011-11-13 LAB — DIFFERENTIAL
Eosinophils Absolute: 0.1 10*3/uL (ref 0.0–0.7)
Eosinophils Relative: 2 % (ref 0–5)
Lymphocytes Relative: 26 % (ref 12–46)
Lymphs Abs: 1.7 10*3/uL (ref 0.7–4.0)
Monocytes Relative: 9 % (ref 3–12)
Neutrophils Relative %: 63 % (ref 43–77)

## 2011-11-13 LAB — COMPREHENSIVE METABOLIC PANEL
ALT: 14 U/L (ref 0–35)
Alkaline Phosphatase: 114 U/L (ref 39–117)
BUN: 10 mg/dL (ref 6–23)
CO2: 26 mEq/L (ref 19–32)
GFR calc Af Amer: >90 mL/min (ref >90)
GFR calc non Af Amer: >90 mL/min (ref >90)
Glucose, Bld: 86 mg/dL (ref 70–99)
Potassium: 4.2 mEq/L (ref 3.5–5.1)
Total Bilirubin: 0.5 mg/dL (ref 0.3–1.2)
Total Protein: 8.4 g/dL — ABNORMAL HIGH (ref 6.0–8.3)

## 2011-11-13 LAB — URINALYSIS, ROUTINE W REFLEX MICROSCOPIC
Hgb urine dipstick: NEGATIVE
Leukocytes, UA: NEGATIVE
Nitrite: NEGATIVE
Protein, ur: NEGATIVE mg/dL
Urobilinogen, UA: 0.2 mg/dL (ref 0.0–1.0)

## 2011-11-13 LAB — POCT PREGNANCY, URINE: Preg Test, Ur: NEGATIVE

## 2011-11-13 LAB — LIPASE, BLOOD: Lipase: 16 U/L (ref 11–59)

## 2011-11-13 LAB — WET PREP, GENITAL: Trich, Wet Prep: NONE SEEN

## 2011-11-13 MED ORDER — OXYCODONE-ACETAMINOPHEN 5-325 MG PO TABS: 1.0000 | ORAL_TABLET | ORAL | Status: DC | PRN

## 2011-11-13 MED ORDER — HYDROMORPHONE HCL PF 1 MG/ML IJ SOLN
0.5000 mg | Freq: Once | INTRAMUSCULAR | Status: AC
  Administered 2011-11-13: 0.5 mg via INTRAVENOUS
  Filled 2011-11-13: qty 1

## 2011-11-13 MED ORDER — HYDROMORPHONE HCL PF 1 MG/ML IJ SOLN
1.0000 mg | Freq: Once | INTRAMUSCULAR | Status: AC
  Administered 2011-11-13: 1 mg via INTRAVENOUS
  Filled 2011-11-13: qty 1

## 2011-11-13 MED ORDER — PROMETHAZINE HCL 25 MG PO TABS: 25.0000 mg | ORAL_TABLET | Freq: Four times a day (QID) | ORAL | Status: DC | PRN

## 2011-11-13 MED ORDER — SODIUM CHLORIDE 0.9 % IV SOLN: Freq: Once | INTRAVENOUS | Status: DC

## 2011-11-13 MED ORDER — SODIUM CHLORIDE 0.9 % IV BOLUS (SEPSIS)
1000.0000 mL | Freq: Once | INTRAVENOUS | Status: AC
  Administered 2011-11-13: 1000 mL via INTRAVENOUS

## 2011-11-13 MED ORDER — ONDANSETRON HCL 4 MG/2ML IJ SOLN
4.0000 mg | Freq: Once | INTRAMUSCULAR | Status: AC
  Administered 2011-11-13: 4 mg via INTRAVENOUS
  Filled 2011-11-13: qty 2

## 2011-11-13 NOTE — ED Notes (Signed)
Patient undressed and in gown.

## 2011-11-13 NOTE — ED Notes (Signed)
Dennie Bible, RN covering for [employer] break.

## 2011-11-13 NOTE — ED Notes (Signed)
Patient resting and watching TV with NAD at this time.  

## 2011-11-13 NOTE — Discharge Instructions (Signed)
Your labs and ultrasound today did not show signs of infection. The ultrasound of your abdomen was also normal. We will treat your pain symptomatically. It is important that you make a follow up with your primary care doctor to get rechecked later this week if possible. If you are having worsening pain, high fever, vomiting, loss of appetite, or other worrisome symptoms, please return to the ER.  RESOURCE GUIDE  Dental Problems  Patients with Medicaid: [facility] [phone] [address]                                           [phone] [employer] Phone:  [phone]                                                  Phone:  [phone]  If unable to pay or uninsured, contact:  [facility] or [facility]. to become qualified for the adult dental clinic.  Chronic Pain Problems Contact [facility]  [phone] Patients need to be referred by their primary care doctor.  Insufficient Money for Medicine Contact [employer]:  call "211" or [facility] (620) 476-1225.  No Primary Care Doctor Call [facility]  [phone] Other agencies that provide inexpensive medical care    [facility]  [phone]    [facility] Internal [facility]  [phone]    [facility]  [phone]    [facility]  [phone]    [facility]  ([phone]    [facility] Child Clinic  [phone]  Psychological Services [facility] Behavioral Health  [phone] [facility] Services  [phone] [facility] Mental Health   [phone] (emergency services 786-210-4295)  Substance Abuse Resources [facility]  873-782-9012 [facility] 772-274-5367 [facility] [phone] Floydene Flock 2108457346 Residential & Outpatient Substance Abuse Program  (323)446-1737  Abuse/Neglect [facility] Child Abuse Hotline 727-200-7771 [facility] Child Abuse Hotline 7031190084 (After Hours)  Emergency  Shelter [facility] Ministries 403-800-1642  Maternity Homes [facility] 859-502-0897 [facility] [phone]  MRSA Hotline #:   802 848 7270    [facility] Resources  [facility]     [employer]                          [facility] Dept. [address] [place]                       [address]      [address]  Patrecia Pace  Michell Heinrich Phone:  [phone]                                   Phone:  [phone]                 Phone:  [phone]  [facility] Mental Health Phone:  ([phone]  [facility] Child Abuse Hotline 763-869-9262 704-473-0975 (After Hours)  Abdominal Pain (Nonspecific) Your exam might not show the exact reason you have abdominal pain. Since there are many different causes of abdominal pain, another checkup and more tests may be needed. It is very important to follow up for lasting (persistent) or worsening symptoms. A possible cause of abdominal pain in any person who still has his or her appendix is acute appendicitis. Appendicitis is often hard to diagnose. Normal blood tests, urine tests, ultrasound, and CT scans do not completely rule out early appendicitis or other causes of abdominal pain. Sometimes, only the changes that happen over time will allow appendicitis and other causes of abdominal pain to be determined. Other potential problems that may require surgery may also take time to become more apparent. Because of this, it is important that you follow all of the instructions below. HOME CARE INSTRUCTIONS   Rest as much as possible.   Do not eat solid food until your pain is gone.   While adults or children have pain: A diet of water, weak decaffeinated tea, broth or bouillon, gelatin, oral rehydration solutions (ORS), frozen ice pops, or ice chips may be helpful.   When  pain is gone in adults or children: Start a light diet (dry toast, crackers, applesauce, or white rice). Increase the diet slowly as long as it does not bother you. Eat no dairy products (including cheese and eggs) and no spicy, fatty, fried, or high-fiber foods.   Use no alcohol, caffeine, or cigarettes.   Take your regular medicines unless your caregiver told you not to.   Take any prescribed medicine as directed.   Only take over-the-counter or prescription medicines for pain, discomfort, or fever as directed by your caregiver. Do not give aspirin to children.  If your caregiver has given you a follow-up appointment, it is very important to keep that appointment. Not keeping the appointment could result in a permanent injury and/or lasting (chronic) pain and/or disability. If there is any problem keeping the appointment, you must call to reschedule.  SEEK IMMEDIATE MEDICAL CARE IF:   Your pain is not gone in 24 hours.   Your pain becomes worse, changes location, or feels different.   You or your child has an oral temperature above 102 F (38.9 C), not controlled by medicine.   Your baby is older than 3 months with a rectal temperature of 102 F (38.9 C) or higher.   Your baby is 7 months old or younger with a rectal temperature of 100.4 F (38 C) or higher.   You have shaking chills.   You keep throwing up (vomiting) or cannot drink liquids.   There is blood in your vomit or you see blood in your bowel movements.   Your bowel movements become dark or black.   You have frequent bowel movements.   Your bowel movements stop (become blocked) or you cannot pass gas.   You have bloody, frequent, or painful urination.   You have yellow discoloration in the skin or whites of the eyes.  Your stomach becomes bloated or bigger.   You have dizziness or fainting.   You have chest or back pain.  MAKE SURE YOU:   Understand these instructions.   Will watch your condition.    Will get help right away if you are not doing well or get worse.  Document Released: 09/09/2005 Document Revised: 05/22/2011 Document Reviewed: 08/07/2009 [facility] Patient Information 2012 [place], [place].

## 2011-11-13 NOTE — ED Notes (Signed)
C/o lower quad abd pain with blood in stool.

## 2011-11-13 NOTE — ED Provider Notes (Signed)
History     CSN: [id number]  Arrival date & time 11/13/11  4098   First MD Initiated Contact with Patient 11/13/11 0932      Chief Complaint  Patient presents with  . Abdominal Pain  . Abdominal Cramping  . Rectal Bleeding  . Nausea    (Consider location/radiation/quality/duration/timing/severity/associated sxs/prior treatment) HPI Patient with pertinent past medical history of cervical cancer, presents with left-sided low abdominal pain. She states this started on Sunday, and it has worsened since that time. Described as sharp in nature; it seems to radiate from the lower left side across her abdomen. No known aggravating factors. She has taken over-the-counter analgesics with no relief. She has noticed decreased appetite and slight nausea, but denies vomiting or diarrhea. Has not had fever or chills. She admits seeing a small amount of bright red blood with having her bowel movement this morning. Denies urinary symptoms or vaginal discharge or bleeding.  She finished her cervical cancer treatment, consisting of chemotherapy and radiation, one year ago. The treatment caused her to go into early menopause; she has not had menses since. Surgical history is significant for cholecystectomy.  Past Medical History  Diagnosis Date  . Hypertension   . Cervical cancer     Past Surgical History  Procedure Date  . Cholecystectomy     No family history on file.  History  Substance Use Topics  . Smoking status: Current Everyday Smoker -- 0.5 packs/day  . Smokeless tobacco: Not on file  . Alcohol Use: Yes    OB History    Grav Para Term Preterm Abortions TAB SAB Ect Mult Living                  Review of Systems  Constitutional: Positive for appetite change. Negative for fever and fatigue.  HENT: Negative.   Respiratory: Negative for chest tightness and shortness of breath.   Cardiovascular: Negative for chest pain and palpitations.  Gastrointestinal: Positive for abdominal  pain and blood in stool. Negative for nausea, vomiting, diarrhea, constipation and rectal pain.  Genitourinary: Negative.   Musculoskeletal: Negative for myalgias.  Skin: Negative for color change and rash.  Neurological: Negative for dizziness and weakness.    Allergies  Morphine and related  Home Medications   Current Outpatient Rx  Name Route Sig Dispense Refill  . IBUPROFEN 200 MG PO TABS Oral Take 400 mg by mouth every 6 (six) hours as needed. For pain      BP 145/101  Pulse 89  Temp(Src) 97.9 F (36.6 C) (Oral)  Resp 18  SpO2 100%  Physical Exam  Nursing note and vitals reviewed. Constitutional: She is oriented to person, place, and time. She appears well-developed and well-nourished. No distress.  HENT:  Head: Normocephalic and atraumatic.  Right Ear: External ear normal.  Left Ear: External ear normal.  Eyes: EOM are normal. Pupils are equal, round, and reactive to light.  Neck: Normal range of motion.  Cardiovascular: Normal rate, regular rhythm and normal heart sounds.   Pulmonary/Chest: Effort normal and breath sounds normal. She exhibits no tenderness.  Abdominal: Soft. Bowel sounds are normal. There is no rebound and no guarding.       Tender to palpation in suprapubic area at the midline as well as left lower quadrant. There is no rebound or jar tenderness.  Genitourinary: Rectum normal, vagina normal and uterus normal.       RN chaperone present during exam White dc present in vaginal vault Tender to palpation  on bimanual at midline, without mass  Musculoskeletal: Normal range of motion.  Neurological: She is alert and oriented to person, place, and time.  Skin: Skin is warm and dry. She is not diaphoretic.  Psychiatric: She has a normal mood and affect.    ED Course  Procedures (including critical care time)  Labs Reviewed  URINALYSIS, ROUTINE W REFLEX MICROSCOPIC - Abnormal; Notable for the following:    Color, Urine AMBER (*) BIOCHEMICALS MAY BE  AFFECTED BY COLOR   Specific Gravity, Urine >1.030 (*)    All other components within normal limits  COMPREHENSIVE METABOLIC PANEL - Abnormal; Notable for the following:    Calcium 11.5 (*)    Total Protein 8.4 (*)    All other components within normal limits  WET PREP, GENITAL - Abnormal; Notable for the following:    WBC, Wet Prep HPF POC FEW (*)    All other components within normal limits  POCT PREGNANCY, URINE  CBC  DIFFERENTIAL  LIPASE, BLOOD  OCCULT BLOOD, POC DEVICE  GC/CHLAMYDIA PROBE AMP, GENITAL   US Transvaginal Non-ob  11/13/2011  *RADIOLOGY REPORT*  Clinical Data: 36 year old female with history of cervical cancer. Suprapubic pain. History of chemotherapy and radiation.  TRANSABDOMINAL AND TRANSVAGINAL ULTRASOUND OF PELVIS Technique:  Both transabdominal and transvaginal ultrasound examinations of the pelvis were performed. Transabdominal technique was performed for global imaging of the pelvis including uterus, ovaries, adnexal regions, and pelvic cul-de-sac.  Comparison: CT abdomen pelvis 05/07/2011.  PET-CT 08/06/2010.   It was necessary to proceed with endovaginal exam following the transabdominal exam to visualize the uterus.  Findings:  Uterus: Mildly heterogeneous echotexture.  7.7 x 3.7 x 4.6 cm.  No enlargement of the lower uterine segment is evident.  Endometrium: 4-5 mm thick in the fundus.  Diminutive elsewhere.  Right ovary:  Not visualized.  Left ovary: Not visualized.  Other findings:  No free fluid.  IMPRESSION: 1.  Sonographic appearance of the uterus is within normal limits. 2.  Adnexa not delineated despite transabdominal and transvaginal imaging.  No free fluid.  Original Report Authenticated By: Harley Hallmark, M.D.   US Pelvis Complete  11/13/2011  *RADIOLOGY REPORT*  Clinical Data: 36 year old female with history of cervical cancer. Suprapubic pain. History of chemotherapy and radiation.  TRANSABDOMINAL AND TRANSVAGINAL ULTRASOUND OF PELVIS Technique:  Both  transabdominal and transvaginal ultrasound examinations of the pelvis were performed. Transabdominal technique was performed for global imaging of the pelvis including uterus, ovaries, adnexal regions, and pelvic cul-de-sac.  Comparison: CT abdomen pelvis 05/07/2011.  PET-CT 08/06/2010.   It was necessary to proceed with endovaginal exam following the transabdominal exam to visualize the uterus.  Findings:  Uterus: Mildly heterogeneous echotexture.  7.7 x 3.7 x 4.6 cm.  No enlargement of the lower uterine segment is evident.  Endometrium: 4-5 mm thick in the fundus.  Diminutive elsewhere.  Right ovary:  Not visualized.  Left ovary: Not visualized.  Other findings:  No free fluid.  IMPRESSION: 1.  Sonographic appearance of the uterus is within normal limits. 2.  Adnexa not delineated despite transabdominal and transvaginal imaging.  No free fluid.  Original Report Authenticated By: Ulla Potash III, M.D.     1. Abdominal pain       MDM  Pt with several days of LLQ abd pain with nausea, poss blood in stool. On exam, tenderness in the suprapubic area. There is no rebound tenderness or guarding or other peritoneal signs. Labs here are unremarkable. She does not have  a white count and labs are unremarkable for UTI, vaginosis or cervicitis. Ultrasound does not show evidence of acute pathology. I do not feel this represents an acute abdomen. Will treat the patient with antiemetics and analgesics. Findings d/w pt. She was instructed to make a followup appointment with her primary care doctor for recheck. Return precautions discussed.        Grant Fontana, [place] 11/13/11 [phone]

## 2011-11-14 ENCOUNTER — Encounter (HOSPITAL_COMMUNITY): Payer: Self-pay

## 2011-11-14 ENCOUNTER — Emergency Department (HOSPITAL_COMMUNITY)
Admission: EM | Admit: 2011-11-14 | Discharge: 2011-11-14 | Disposition: A | Discharge status: Home / Self Care | Payer: Medicaid Other | Attending: Emergency Medicine | Admitting: Emergency Medicine

## 2011-11-14 ENCOUNTER — Emergency Department (HOSPITAL_COMMUNITY): Payer: Medicaid Other

## 2011-11-14 DIAGNOSIS — R10814 Left lower quadrant abdominal tenderness: Secondary | ICD-10-CM | POA: Insufficient documentation

## 2011-11-14 DIAGNOSIS — Z8541 Personal history of malignant neoplasm of cervix uteri: Secondary | ICD-10-CM | POA: Insufficient documentation

## 2011-11-14 DIAGNOSIS — I10 Essential (primary) hypertension: Secondary | ICD-10-CM | POA: Insufficient documentation

## 2011-11-14 DIAGNOSIS — K921 Melena: Secondary | ICD-10-CM | POA: Insufficient documentation

## 2011-11-14 DIAGNOSIS — R109 Unspecified abdominal pain: Secondary | ICD-10-CM | POA: Insufficient documentation

## 2011-11-14 DIAGNOSIS — F17200 Nicotine dependence, unspecified, uncomplicated: Secondary | ICD-10-CM | POA: Insufficient documentation

## 2011-11-14 LAB — DIFFERENTIAL
Basophils Relative: 1 % (ref 0–1)
Lymphocytes Relative: 26 % (ref 12–46)
Lymphs Abs: 1.8 10*3/uL (ref 0.7–4.0)
Monocytes Absolute: 0.6 10*3/uL (ref 0.1–1.0)
Monocytes Relative: 8 % (ref 3–12)
Neutro Abs: 4.3 10*3/uL (ref 1.7–7.7)
Neutrophils Relative %: 64 % (ref 43–77)

## 2011-11-14 LAB — BASIC METABOLIC PANEL
BUN: 7 mg/dL (ref 6–23)
CO2: 27 mEq/L (ref 19–32)
Chloride: 105 mEq/L (ref 96–112)
Creatinine, Ser: 0.67 mg/dL (ref 0.50–1.10)
GFR calc Af Amer: >90 mL/min (ref >90)
Potassium: 4.5 mEq/L (ref 3.5–5.1)

## 2011-11-14 LAB — CBC
HCT: 39.6 % (ref 36.0–46.0)
Hemoglobin: 13.7 g/dL (ref 12.0–15.0)
RBC: 4.32 MIL/uL (ref 3.87–5.11)
WBC: 6.8 10*3/uL (ref 4.0–10.5)

## 2011-11-14 LAB — GC/CHLAMYDIA PROBE AMP, GENITAL
Chlamydia, DNA Probe: NEGATIVE
GC Probe Amp, Genital: NEGATIVE

## 2011-11-14 MED ORDER — HYDROMORPHONE HCL PF 1 MG/ML IJ SOLN
1.0000 mg | Freq: Once | INTRAMUSCULAR | Status: AC
  Administered 2011-11-14: 1 mg via INTRAVENOUS
  Filled 2011-11-14: qty 1

## 2011-11-14 MED ORDER — IOHEXOL 300 MG/ML  SOLN: 20.0000 mL | INTRAMUSCULAR | Status: AC

## 2011-11-14 MED ORDER — IOHEXOL 300 MG/ML  SOLN
80.0000 mL | Freq: Once | INTRAMUSCULAR | Status: AC | PRN
  Administered 2011-11-14: 80 mL via INTRAVENOUS

## 2011-11-14 MED ORDER — ONDANSETRON HCL 4 MG/2ML IJ SOLN
4.0000 mg | Freq: Once | INTRAMUSCULAR | Status: AC
  Administered 2011-11-14: 4 mg via INTRAVENOUS
  Filled 2011-11-14: qty 2

## 2011-11-14 NOTE — Discharge Instructions (Signed)

## 2011-11-14 NOTE — ED Provider Notes (Signed)
Medical screening examination/treatment/procedure(s) were performed by non-physician practitioner and as supervising physician I was immediately available for consultation/collaboration.  Nicholes Stairs, MD 11/14/11 [phone]

## 2011-11-14 NOTE — ED Notes (Signed)
Pt returned from CT, NAD.

## 2011-11-14 NOTE — ED Notes (Signed)
Pt states that she was seen here yesterday for the same complaint. She was given phenergan and percocet pta with no relief. She has returned with no relief.

## 2011-11-14 NOTE — ED Provider Notes (Signed)
Medical screening examination/treatment/procedure(s) were performed by non-physician practitioner and as supervising physician I was immediately available for consultation/collaboration.  Nicholes Stairs, MD 11/14/11 [phone]

## 2011-11-14 NOTE — ED Notes (Signed)
CT notified pt has completed drinking CT contrast

## 2011-11-14 NOTE — ED Provider Notes (Signed)
History     CSN: [id number]  Arrival date & time 11/14/11  1102   First MD Initiated Contact with Patient 11/14/11 1200      Chief Complaint  Patient presents with  . Abdominal Pain    (Consider location/radiation/quality/duration/timing/severity/associated sxs/prior treatment) HPI  36 year old female with history of cervical cancer is presenting to the ED with chief complaints of left lower abdominal pain. Pain has been ongoing for the past 4 days. Pain is described as a squeezing sensation. There is some associated nausea without vomiting, diarrhea or constipation. Patient was seen in the ED for same complaint yesterday. At which time a thorough evaluation was performed including pelvic examination labs, pelvic ultrasound, and pain control.  Her evaluation was unremarkable. Patient was discharged with antiemetic and percocet.  She was instructed to follow up with her primary care Dr. However, patient states she noticed no improvement despite taking pain medication at home. She was diagnosed with cervical cancer in 2011 has undergone radiation and chemotherapy. She denies any fever, myalgia, weight changes but does endorse night sweats for the past several months. She denies chest pain or shortness of breath. She denies dysuria. She does notice occasional trace of blood in her stool. She denies melenic stool.  Past Medical History  Diagnosis Date  . Hypertension   . Cervical cancer     Past Surgical History  Procedure Date  . Cholecystectomy     History reviewed. No pertinent family history.  History  Substance Use Topics  . Smoking status: Current Everyday Smoker -- 0.5 packs/day  . Smokeless tobacco: Not on file  . Alcohol Use: 0.5 oz/week    1 drink(s) per week    OB History    Grav Para Term Preterm Abortions TAB SAB Ect Mult Living                  Review of Systems  All other systems reviewed and are negative.    Allergies  Morphine and related  Home  Medications   Current Outpatient Rx  Name Route Sig Dispense Refill  . OXYCODONE-ACETAMINOPHEN 5-325 MG PO TABS Oral Take 1 tablet by mouth every 4 (four) hours as needed. For pain.    Marland Kitchen PROMETHAZINE HCL 25 MG PO TABS Oral Take 25 mg by mouth every 6 (six) hours as needed. For nausea.      BP 137/95  Pulse 88  Temp(Src) 98 F (36.7 C) (Oral)  Resp 20  SpO2 97%  Physical Exam  Nursing note and vitals reviewed. Constitutional: She appears well-developed and well-nourished.       Appears tearful  HENT:  Head: Atraumatic.  Eyes: Conjunctivae are normal.  Neck: Normal range of motion. Neck supple.  Cardiovascular: Normal rate and regular rhythm.   Pulmonary/Chest: Effort normal. No respiratory distress. She exhibits no tenderness.  Abdominal: Soft. There is tenderness in the left lower quadrant. There is no rigidity, no rebound, no guarding, no CVA tenderness, no tenderness at McBurney's point and negative Murphy's sign. No hernia. Hernia confirmed negative in the ventral area, confirmed negative in the right inguinal area and confirmed negative in the left inguinal area.    ED Course  Procedures (including critical care time)  Labs Reviewed - No data to display US Transvaginal Non-ob  11/13/2011  *RADIOLOGY REPORT*  Clinical Data: 36 year old female with history of cervical cancer. Suprapubic pain. History of chemotherapy and radiation.  TRANSABDOMINAL AND TRANSVAGINAL ULTRASOUND OF PELVIS Technique:  Both transabdominal and transvaginal ultrasound examinations of  the pelvis were performed. Transabdominal technique was performed for global imaging of the pelvis including uterus, ovaries, adnexal regions, and pelvic cul-de-sac.  Comparison: CT abdomen pelvis 05/07/2011.  PET-CT 08/06/2010.   It was necessary to proceed with endovaginal exam following the transabdominal exam to visualize the uterus.  Findings:  Uterus: Mildly heterogeneous echotexture.  7.7 x 3.7 x 4.6 cm.  No enlargement  of the lower uterine segment is evident.  Endometrium: 4-5 mm thick in the fundus.  Diminutive elsewhere.  Right ovary:  Not visualized.  Left ovary: Not visualized.  Other findings:  No free fluid.  IMPRESSION: 1.  Sonographic appearance of the uterus is within normal limits. 2.  Adnexa not delineated despite transabdominal and transvaginal imaging.  No free fluid.  Original Report Authenticated By: Harley Hallmark, M.D.   US Pelvis Complete  11/13/2011  *RADIOLOGY REPORT*  Clinical Data: 36 year old female with history of cervical cancer. Suprapubic pain. History of chemotherapy and radiation.  TRANSABDOMINAL AND TRANSVAGINAL ULTRASOUND OF PELVIS Technique:  Both transabdominal and transvaginal ultrasound examinations of the pelvis were performed. Transabdominal technique was performed for global imaging of the pelvis including uterus, ovaries, adnexal regions, and pelvic cul-de-sac.  Comparison: CT abdomen pelvis 05/07/2011.  PET-CT 08/06/2010.   It was necessary to proceed with endovaginal exam following the transabdominal exam to visualize the uterus.  Findings:  Uterus: Mildly heterogeneous echotexture.  7.7 x 3.7 x 4.6 cm.  No enlargement of the lower uterine segment is evident.  Endometrium: 4-5 mm thick in the fundus.  Diminutive elsewhere.  Right ovary:  Not visualized.  Left ovary: Not visualized.  Other findings:  No free fluid.  IMPRESSION: 1.  Sonographic appearance of the uterus is within normal limits. 2.  Adnexa not delineated despite transabdominal and transvaginal imaging.  No free fluid.  Original Report Authenticated By: Harley Hallmark, M.D.     No diagnosis found.  Results for orders placed during the hospital encounter of 11/14/11  CBC      Component Value Range   WBC 6.8  4.0 - 10.5 (K/uL)   RBC 4.32  3.87 - 5.11 (MIL/uL)   Hemoglobin 13.7  12.0 - 15.0 (g/dL)   HCT 09.6  04.5 - 40.9 (%)   MCV 91.7  78.0 - 100.0 (fL)   MCH 31.7  26.0 - 34.0 (pg)   MCHC 34.6  30.0 - 36.0 (g/dL)    RDW 81.1  91.4 - 78.2 (%)   Platelets 265  150 - 400 (K/uL)  DIFFERENTIAL      Component Value Range   Neutrophils Relative 64  43 - 77 (%)   Neutro Abs 4.3  1.7 - 7.7 (K/uL)   Lymphocytes Relative 26  12 - 46 (%)   Lymphs Abs 1.8  0.7 - 4.0 (K/uL)   Monocytes Relative 8  3 - 12 (%)   Monocytes Absolute 0.6  0.1 - 1.0 (K/uL)   Eosinophils Relative 2  0 - 5 (%)   Eosinophils Absolute 0.1  0.0 - 0.7 (K/uL)   Basophils Relative 1  0 - 1 (%)   Basophils Absolute 0.1  0.0 - 0.1 (K/uL)  BASIC METABOLIC PANEL      Component Value Range   Sodium 141  135 - 145 (mEq/L)   Potassium 4.5  3.5 - 5.1 (mEq/L)   Chloride 105  96 - 112 (mEq/L)   CO2 27  19 - 32 (mEq/L)   Glucose, Bld 98  70 - 99 (mg/dL)  BUN 7  6 - 23 (mg/dL)   Creatinine, Ser 4.09  0.50 - 1.10 (mg/dL)   Calcium 81.1 (*) 8.4 - 10.5 (mg/dL)   GFR calc non Af Amer >90  >90 (mL/min)   GFR calc Af Amer >90  >90 (mL/min)   US Transvaginal Non-ob  11/13/2011  *RADIOLOGY REPORT*  Clinical Data: 36 year old female with history of cervical cancer. Suprapubic pain. History of chemotherapy and radiation.  TRANSABDOMINAL AND TRANSVAGINAL ULTRASOUND OF PELVIS Technique:  Both transabdominal and transvaginal ultrasound examinations of the pelvis were performed. Transabdominal technique was performed for global imaging of the pelvis including uterus, ovaries, adnexal regions, and pelvic cul-de-sac.  Comparison: CT abdomen pelvis 05/07/2011.  PET-CT 08/06/2010.   It was necessary to proceed with endovaginal exam following the transabdominal exam to visualize the uterus.  Findings:  Uterus: Mildly heterogeneous echotexture.  7.7 x 3.7 x 4.6 cm.  No enlargement of the lower uterine segment is evident.  Endometrium: 4-5 mm thick in the fundus.  Diminutive elsewhere.  Right ovary:  Not visualized.  Left ovary: Not visualized.  Other findings:  No free fluid.  IMPRESSION: 1.  Sonographic appearance of the uterus is within normal limits. 2.  Adnexa not  delineated despite transabdominal and transvaginal imaging.  No free fluid.  Original Report Authenticated By: Harley Hallmark, M.D.   US Pelvis Complete  11/13/2011  *RADIOLOGY REPORT*  Clinical Data: 36 year old female with history of cervical cancer. Suprapubic pain. History of chemotherapy and radiation.  TRANSABDOMINAL AND TRANSVAGINAL ULTRASOUND OF PELVIS Technique:  Both transabdominal and transvaginal ultrasound examinations of the pelvis were performed. Transabdominal technique was performed for global imaging of the pelvis including uterus, ovaries, adnexal regions, and pelvic cul-de-sac.  Comparison: CT abdomen pelvis 05/07/2011.  PET-CT 08/06/2010.   It was necessary to proceed with endovaginal exam following the transabdominal exam to visualize the uterus.  Findings:  Uterus: Mildly heterogeneous echotexture.  7.7 x 3.7 x 4.6 cm.  No enlargement of the lower uterine segment is evident.  Endometrium: 4-5 mm thick in the fundus.  Diminutive elsewhere.  Right ovary:  Not visualized.  Left ovary: Not visualized.  Other findings:  No free fluid.  IMPRESSION: 1.  Sonographic appearance of the uterus is within normal limits. 2.  Adnexa not delineated despite transabdominal and transvaginal imaging.  No free fluid.  Original Report Authenticated By: Harley Hallmark, M.D.   Ct Abdomen Pelvis W Contrast  11/14/2011  *RADIOLOGY REPORT*  Clinical Data: Lower abdominal pain and blood in stools. History of cervical cancer.  CT ABDOMEN AND PELVIS WITH CONTRAST  Technique:  Multidetector CT imaging of the abdomen and pelvis was performed following the standard protocol during bolus administration of intravenous contrast.  Contrast: 80mL OMNIPAQUE IOHEXOL 300 MG/ML IV SOLN  Comparison: CT scan 07/16/2010 and 05/07/2011.  Findings: The lung bases are clear.  Minimal dependent atelectasis. No pulmonary nodules or pleural effusions.  The liver is unremarkable.  No focal lesions or biliary dilatation. The gallbladder is  surgically absent.  No common bile duct dilatation.  The pancreas appears normal.  The spleen is normal in size.  No focal lesions.  The adrenal glands and kidneys demonstrate no abnormalities.  The stomach, duodenum, small bowel and colon are unremarkable.  No mass lesions or inflammatory changes.  The appendix is normal.  The aorta is normal in caliber.  The major branch vessels are patent.  There are a few small scattered mesenteric and retroperitoneal lymph nodes but no adenopathy.  The body of the uterus is still present.  Surgical clips or possible brachytherapy seeds are noted near the lower uterine segment.  The endometrium appears very prominent.  Cannot exclude hydrocolpos.  Pelvic ultrasound may be helpful for further evaluation of the uterus. The ovaries appear normal.  There is a small amount of free pelvic fluid a few small pelvic lymph nodes are noted.  No obturator or inguinal adenopathy.  The bladder appears normal.  IMPRESSION:  1.  Surgical changes in the lower pelvis.  The ovaries appear normal.  The uterus demonstrates a possibly dilated endometrial canal.  MRI or ultrasound may be helpful for further evaluation. 2.  Small amount of free pelvic fluid and a few small pelvic lymph nodes. 3.  No acute abdominal findings or evidence of metastatic disease.  Original Report Authenticated By: P. Loralie Champagne, M.D.      MDM  This is a bounce back visit of lower abdominal pain. Due to the history of cervical cancer and her presenting complaint, I will obtain basic lab works, abdominal and pelvis CT scan for further evaluation. Patient will be given pain medication and IV fluid here in the ED.  2:24 PM Abdominal/pelvis CT scan shows no acute finding.  I recommend pt to f/u with her PCP and her oncology doctor for further evaluation. Pt is currently in NAD, and with stable vital sign.  Pt agrees with plan.        Fayrene Helper, PA-C 11/14/11 1426

## 2011-11-17 ENCOUNTER — Encounter (HOSPITAL_COMMUNITY): Payer: Self-pay | Admitting: Obstetrics and Gynecology

## 2011-11-17 ENCOUNTER — Inpatient Hospital Stay (HOSPITAL_COMMUNITY)
Admission: AD | Admit: 2011-11-17 | Discharge: 2011-11-17 | Disposition: A | Discharge status: Home / Self Care | Payer: Medicaid Other | Source: Ambulatory Visit | Attending: Obstetrics | Admitting: Obstetrics

## 2011-11-17 DIAGNOSIS — R1031 Right lower quadrant pain: Secondary | ICD-10-CM | POA: Insufficient documentation

## 2011-11-17 DIAGNOSIS — K625 Hemorrhage of anus and rectum: Secondary | ICD-10-CM

## 2011-11-17 LAB — CBC
HCT: 39.9 % (ref 36.0–46.0)
Hemoglobin: 13.3 g/dL (ref 12.0–15.0)
MCH: 31.2 pg (ref 26.0–34.0)
MCHC: 33.3 g/dL (ref 30.0–36.0)
MCV: 93.7 fL (ref 78.0–100.0)
Platelets: 281 10*3/uL (ref 150–400)
RBC: 4.26 MIL/uL (ref 3.87–5.11)
RDW: 13.9 % (ref 11.5–15.5)
WBC: 9.6 10*3/uL (ref 4.0–10.5)

## 2011-11-17 LAB — URINALYSIS, ROUTINE W REFLEX MICROSCOPIC
Bilirubin Urine: NEGATIVE
Hgb urine dipstick: NEGATIVE
Nitrite: NEGATIVE
Specific Gravity, Urine: 1.01 (ref 1.005–1.030)
Urobilinogen, UA: 1 mg/dL (ref 0.0–1.0)
pH: 6.5 (ref 5.0–8.0)

## 2011-11-17 MED ORDER — OXYCODONE-ACETAMINOPHEN 5-325 MG PO TABS: 1.0000 | ORAL_TABLET | ORAL | Status: AC | PRN

## 2011-11-17 MED ORDER — KETOROLAC TROMETHAMINE 60 MG/2ML IM SOLN
60.0000 mg | Freq: Once | INTRAMUSCULAR | Status: AC
  Administered 2011-11-17: 60 mg via INTRAMUSCULAR
  Filled 2011-11-17: qty 2

## 2011-11-17 NOTE — Discharge Instructions (Signed)

## 2011-11-17 NOTE — Progress Notes (Addendum)
t reports having severe abd pain x 1 week. Went to [facility] and had MRI and they did not find anything wrong. Has been taking percocet which helped some. Out of percocet. Pt has had cervical cancer with chemo and radiation therapy that was finished last year. Pt also reports she feels like her lips and finger tips are going numb.

## 2011-11-17 NOTE — ED Provider Notes (Signed)
History     CSN: [id number]  Arrival date & time 11/17/11  0907   None     Chief Complaint  Patient presents with  . Abdominal Pain    (Consider location/radiation/quality/duration/timing/severity/associated sxs/prior treatment) HPI PEGGI YONO is a 36 y.o. G 10 P 8. She has  had low abdominal pain x 1 wk, had 2 ED visits at [facility] with no dx, tx with pain med. On 2/20 and 2/21 she c/o LLQ pain, BRB in BM, labs, U/S and MRI were neg. Today she c/o RLQ pain like contractions, off/on sharp cramps, Was 10/10 when arrived is 5/10 now. No N,V, D, or constipation. Last nl BM 2/22. She feels rectal pressure, only has sm amt bloody mucoid discharge. She has had this for 2-3 wks.  No fevers, occ has sweats. Poor appetite, only drinking.  Past Medical History  Diagnosis Date  . Hypertension   . Cervical cancer     Past Surgical History  Procedure Date  . Cholecystectomy     No family history on file.  History  Substance Use Topics  . Smoking status: Current Everyday Smoker -- 0.5 packs/day  . Smokeless tobacco: Not on file  . Alcohol Use: 0.5 oz/week    1 drink(s) per week    OB History    Grav Para Term Preterm Abortions TAB SAB Ect Mult Living                  Review of Systems  Constitutional: Positive for diaphoresis and appetite change. Negative for fever and chills.  Gastrointestinal: Negative for abdominal distention.       Denies nausea, vomiting, diarrhea or constipation. Has bloody mucoid rectal discharge. Poor appetite.  Genitourinary: Negative for dysuria, urgency, frequency, vaginal bleeding and vaginal discharge.  Musculoskeletal: Negative.   Neurological: Negative.   Psychiatric/Behavioral: Negative.     Allergies  Morphine and related  Home Medications  No current outpatient prescriptions on file.  BP 133/75  Pulse 69  Temp(Src) 97.5 F (36.4 C) (Oral)  Resp 20  SpO2 100%  Physical Exam  Constitutional: She is oriented to person, place,  and time. She appears well-developed and well-nourished.       Curled in bed, appears uncomfortable.  Abdominal: Soft. She exhibits no distension and no mass. There is tenderness. There is no rebound and no guarding.       BS hyperactive  Musculoskeletal: Normal range of motion.  Neurological: She is alert and oriented to person, place, and time.  Skin: Skin is warm and dry. She is not diaphoretic.  Psychiatric: She has a normal mood and affect. Her behavior is normal. Thought content normal.    ED Course  Procedures (including critical care time)   Labs Reviewed  POCT PREGNANCY, URINE  URINALYSIS, ROUTINE W REFLEX MICROSCOPIC  CBC   No results found. Results for orders placed during the hospital encounter of 11/17/11 (from the past 24 hour(s))  URINALYSIS, ROUTINE W REFLEX MICROSCOPIC     Status: Normal   Collection Time   11/17/11  9:20 AM      Component Value Range   Color, Urine YELLOW  YELLOW    APPearance CLEAR  CLEAR    Specific Gravity, Urine 1.010  1.005 - 1.030    pH 6.5  5.0 - 8.0    Glucose, UA NEGATIVE  NEGATIVE (mg/dL)   Hgb urine dipstick NEGATIVE  NEGATIVE    Bilirubin Urine NEGATIVE  NEGATIVE    Ketones, ur NEGATIVE  NEGATIVE (mg/dL)   Protein, ur NEGATIVE  NEGATIVE (mg/dL)   Urobilinogen, UA 1.0  0.0 - 1.0 (mg/dL)   Nitrite NEGATIVE  NEGATIVE    Leukocytes, UA NEGATIVE  NEGATIVE   POCT PREGNANCY, URINE     Status: Normal   Collection Time   11/17/11  9:22 AM      Component Value Range   Preg Test, Ur NEGATIVE  NEGATIVE   CBC     Status: Normal   Collection Time   11/17/11 10:52 AM      Component Value Range   WBC 9.6  4.0 - 10.5 (K/uL)   RBC 4.26  3.87 - 5.11 (MIL/uL)   Hemoglobin 13.3  12.0 - 15.0 (g/dL)   HCT 16.1  09.6 - 04.5 (%)   MCV 93.7  78.0 - 100.0 (fL)   MCH 31.2  26.0 - 34.0 (pg)   MCHC 33.3  30.0 - 36.0 (g/dL)   RDW 40.9  81.1 - 91.4 (%)   Platelets 281  150 - 400 (K/uL)  ASSESSMENT:  Abd pain, prob GI in origin. Pain has increased  again, pt rocking in the bed.   Consulted with Dr Gaynell Face, painmed, to go to the offic ein the am for referral to GI. No diagnosis found.  PLAN:  Toradol 60 mg IM here, Rx for Percocet fo home use. Offered to send to ED today.  MDM          Avon Gully. Tiburcio Pea, NP 11/17/11 [phone]

## 2011-12-30 ENCOUNTER — Encounter (HOSPITAL_COMMUNITY): Payer: Self-pay | Admitting: Emergency Medicine

## 2011-12-30 ENCOUNTER — Emergency Department (HOSPITAL_COMMUNITY)
Admission: EM | Admit: 2011-12-30 | Discharge: 2011-12-30 | Disposition: A | Discharge status: Home / Self Care | Payer: Medicaid Other | Attending: Emergency Medicine | Admitting: Emergency Medicine

## 2011-12-30 DIAGNOSIS — F17200 Nicotine dependence, unspecified, uncomplicated: Secondary | ICD-10-CM | POA: Insufficient documentation

## 2011-12-30 DIAGNOSIS — Z9089 Acquired absence of other organs: Secondary | ICD-10-CM | POA: Insufficient documentation

## 2011-12-30 DIAGNOSIS — I10 Essential (primary) hypertension: Secondary | ICD-10-CM | POA: Insufficient documentation

## 2011-12-30 DIAGNOSIS — Z886 Allergy status to analgesic agent status: Secondary | ICD-10-CM | POA: Insufficient documentation

## 2011-12-30 DIAGNOSIS — G8929 Other chronic pain: Secondary | ICD-10-CM | POA: Insufficient documentation

## 2011-12-30 DIAGNOSIS — Z8541 Personal history of malignant neoplasm of cervix uteri: Secondary | ICD-10-CM | POA: Insufficient documentation

## 2011-12-30 LAB — POCT PREGNANCY, URINE: Preg Test, Ur: NEGATIVE

## 2011-12-30 MED ORDER — OXYCODONE-ACETAMINOPHEN 5-325 MG PO TABS: 1.0000 | ORAL_TABLET | ORAL | Status: AC | PRN

## 2011-12-30 MED ORDER — OXYCODONE-ACETAMINOPHEN 5-325 MG PO TABS
2.0000 | ORAL_TABLET | Freq: Once | ORAL | Status: AC
  Administered 2011-12-30: 2 via ORAL
  Filled 2011-12-30: qty 2

## 2011-12-30 NOTE — ED Notes (Signed)
ZOX:[id number]<AV> Expected date:12/30/11<BR> Expected time:<BR> Means of arrival:<BR> Comments:<BR> EMS 70 GC - generalized weakness/pain

## 2011-12-30 NOTE — ED Notes (Signed)
Pt request pain meds prior to pelvic exam.  RN notified.

## 2011-12-30 NOTE — Discharge Instructions (Signed)
FOLLOW UP WITH DR. Russella Dar FOR GI EVALUATION AND WITH DR. Concepcion Elk FOR FURTHER PAIN MANAGEMENT AND/OR REFERRAL TO PAIN MANAGEMENT.   Abdominal Pain Abdominal pain can be caused by many things. Your caregiver decides the seriousness of your pain by an examination and possibly blood tests and X-rays. Many cases can be observed and treated at home. Most abdominal pain is not caused by a disease and will probably improve without treatment. However, in many cases, more time must pass before a clear cause of the pain can be found. Before that point, it may not be known if you need more testing, or if hospitalization or surgery is needed. HOME CARE INSTRUCTIONS   Do not take laxatives unless directed by your caregiver.   Take pain medicine only as directed by your caregiver.   Only take over-the-counter or prescription medicines for pain, discomfort, or fever as directed by your caregiver.   Try a clear liquid diet (broth, tea, or water) for as long as directed by your caregiver. Slowly move to a bland diet as tolerated.  SEEK IMMEDIATE MEDICAL CARE IF:   The pain does not go away.   You have a fever.   You keep throwing up (vomiting).   The pain is felt only in portions of the abdomen. Pain in the right side could possibly be appendicitis. In an adult, pain in the left lower portion of the abdomen could be colitis or diverticulitis.   You pass bloody or black tarry stools.  MAKE SURE YOU:   Understand these instructions.   Will watch your condition.   Will get help right away if you are not doing well or get worse.  Document Released: 06/19/2005 Document Revised: 08/29/2011 Document Reviewed: 04/27/2008 [facility] Patient Information 2012 [place], [place].

## 2011-12-30 NOTE — ED Notes (Signed)
Severe low abd pain started 2 weeks ago. Went to regular doctor- gave pain meds, was told that doctor would refer to GI and Radiation Oncology. Has not seen anyone, but is out of pain meds and still is hurting. Blood in stool, sm amt vag discharge- milky white. [facility] at [facility] weeks ago, [facility]--

## 2011-12-30 NOTE — ED Provider Notes (Signed)
History     CSN: [id number]  Arrival date & time 12/30/11  1053   First MD Initiated Contact with Patient 12/30/11 1135      Chief Complaint  Patient presents with  . Abdominal Pain  . Cervical Cancer    (Consider location/radiation/quality/duration/timing/severity/associated sxs/prior treatment) Patient is a 36 y.o. female presenting with abdominal pain. The history is provided by the patient.  Abdominal Pain The primary symptoms of the illness include abdominal pain. The primary symptoms of the illness do not include fever, vomiting, dysuria, vaginal discharge or vaginal bleeding. Episode onset: Lower abdominal pain for over one year since being treated for cervical cancer.  The abdominal pain radiates to the suprapubic region, RLQ and LLQ. Relieved by: She has been on Percocet for pain but reports her doctor will not prescribe them any more and she is waiting for a referral to Pain Management.  The patient has not had a change in bowel habit. Symptoms associated with the illness do not include chills. Associated symptoms comments: She states she has been seen and evaluated for blood in her bowel movements as well and is supposed to be getting a referral to GI as well. .    Past Medical History  Diagnosis Date  . Hypertension   . Cervical cancer     Past Surgical History  Procedure Date  . Cholecystectomy     History reviewed. No pertinent family history.  History  Substance Use Topics  . Smoking status: Current Everyday Smoker -- 0.5 packs/day  . Smokeless tobacco: Not on file  . Alcohol Use: 0.5 oz/week    1 drink(s) per week     occasionally    OB History    Grav Para Term Preterm Abortions TAB SAB Ect Mult Living   10 8              Review of Systems  Constitutional: Negative for fever and chills.  Respiratory: Negative.   Cardiovascular: Negative.   Gastrointestinal: Positive for abdominal pain and blood in stool. Negative for vomiting.  Genitourinary:  Negative.  Negative for dysuria, vaginal bleeding and vaginal discharge.  Musculoskeletal: Negative.   Neurological: Negative.     Allergies  Morphine and related  Home Medications   Current Outpatient Rx  Name Route Sig Dispense Refill  . HYDROCHLOROTHIAZIDE 12.5 MG PO TABS Oral Take 12.5 mg by mouth daily.    . OXYCODONE-ACETAMINOPHEN 5-325 MG PO TABS Oral Take 1 tablet by mouth 2 (two) times daily as needed. For pain      BP 128/91  Pulse 95  Temp(Src) 98.2 F (36.8 C) (Oral)  Resp 18  SpO2 100%  Physical Exam  Constitutional: She is oriented to person, place, and time. She appears well-developed and well-nourished.  Neck: Normal range of motion.  Cardiovascular: Normal rate.   No murmur heard. Pulmonary/Chest: Effort normal and breath sounds normal. No respiratory distress. She has no wheezes.  Abdominal: She exhibits no distension and no mass. There is no rebound and no guarding.       Tender in lower abdomen without rebound or guarding. Soft abdomen.  Musculoskeletal: She exhibits no edema.  Neurological: She is alert and oriented to person, place, and time.  Skin: Skin is warm and dry.    ED Course  Procedures (including critical care time)  Labs Reviewed - No data to display No results found.   No diagnosis found. 1. Chronic abdominal pain.    MDM  Chart reviewed. The patient has  had extensive evaluations for same pain, including recent and normal CT abdomen and pelvic ultrasounds. No fever, VSS, presenting without new complaint. Feel she can be discharged home, will give GI referral and encourage follow up with Dr. Concepcion Elk to pursue pain management care.        Rodena Medin, PA-C 12/30/11 1322

## 2011-12-30 NOTE — ED Notes (Signed)
Crying, states abd pain not relieved at all with medicine. Trying to get appt with Dr. Nelly Rout

## 2011-12-30 NOTE — ED Notes (Signed)
C/o abd pain, writhing in discomfort, but smiling

## 2012-01-02 NOTE — ED Provider Notes (Signed)
Medical screening examination/treatment/procedure(s) were conducted as a shared visit with non-physician practitioner(s) and myself.  I personally evaluated the patient during the encounter Pt c/o abd pain. Currently improved. abd soft nt. No cva tenderness  Suzi Roots, MD 01/02/12 [phone]

## 2012-01-07 ENCOUNTER — Other Ambulatory Visit (HOSPITAL_COMMUNITY)
Admission: RE | Admit: 2012-01-07 | Discharge: 2012-01-07 | Disposition: A | Discharge status: Home / Self Care | Payer: Medicaid Other | Source: Ambulatory Visit | Attending: Gynecology | Admitting: Gynecology

## 2012-01-07 ENCOUNTER — Encounter: Payer: Self-pay | Admitting: Gynecologic Oncology

## 2012-01-07 ENCOUNTER — Ambulatory Visit: Payer: Medicaid Other | Attending: Gynecologic Oncology | Admitting: Gynecologic Oncology

## 2012-01-07 VITALS — BP 120/90 | HR 68 | Temp 97.9°F | Resp 16 | Ht 64.57 in | Wt 158.9 lb

## 2012-01-07 DIAGNOSIS — T66XXXA Radiation sickness, unspecified, initial encounter: Secondary | ICD-10-CM | POA: Insufficient documentation

## 2012-01-07 DIAGNOSIS — C539 Malignant neoplasm of cervix uteri, unspecified: Secondary | ICD-10-CM | POA: Insufficient documentation

## 2012-01-07 DIAGNOSIS — K625 Hemorrhage of anus and rectum: Secondary | ICD-10-CM | POA: Insufficient documentation

## 2012-01-07 DIAGNOSIS — I10 Essential (primary) hypertension: Secondary | ICD-10-CM | POA: Insufficient documentation

## 2012-01-07 DIAGNOSIS — N949 Unspecified condition associated with female genital organs and menstrual cycle: Secondary | ICD-10-CM | POA: Insufficient documentation

## 2012-01-07 DIAGNOSIS — Z01419 Encounter for gynecological examination (general) (routine) without abnormal findings: Secondary | ICD-10-CM | POA: Insufficient documentation

## 2012-01-07 DIAGNOSIS — F17200 Nicotine dependence, unspecified, uncomplicated: Secondary | ICD-10-CM | POA: Insufficient documentation

## 2012-01-07 DIAGNOSIS — K6289 Other specified diseases of anus and rectum: Secondary | ICD-10-CM | POA: Insufficient documentation

## 2012-01-07 DIAGNOSIS — Y842 Radiological procedure and radiotherapy as the cause of abnormal reaction of the patient, or of later complication, without mention of misadventure at the time of the procedure: Secondary | ICD-10-CM | POA: Insufficient documentation

## 2012-01-07 DIAGNOSIS — Z923 Personal history of irradiation: Secondary | ICD-10-CM | POA: Insufficient documentation

## 2012-01-07 NOTE — Progress Notes (Signed)
HISTORY OF PRESENT ILLNESS:  This is a 36 year old gravida 7, para 8 with a last normal Pap test 2 years ago, immediately prior to the birth of her last child.  She reports 2 months of vaginal bleeding.  She does report several weeks of lower back pain, radiating down the left lower extremity.  She was recently diagnosed for pyelonephritis and treated, however, the crampy lower abdominal pain that can measure 10/10 continued to occur. She was seen by Dr. Coral Ceo and a cervical biopsy on June 27, 2010, returned with invasive squamous cell carcinoma.  There is no evidence of angiolymphatic or perineural invasion.  A CT scan of the abdomen and pelvis on June 25, 2010, demonstrating a mass-like enlargement of the lower uterine segment in cervix.  Overall, the uterus measured 12 x 5.5 x 5.8 cm. The left ovary was within normal limits. Of note, there was a single enlarged left common iliac node, measuring 11 mm in the short diameter.  There was subcentimeter left periaortic and aortocaval nodes.  The cervix was noted to measure approximately 7 cm.  PET scan notable for nodes noted in the periaortic and left common iliac nodal chains.    She received external beam radiotherapy and regular therapy with sensitizing cisplatin .  Radiation treatment occurred from 08/10/2010 through January 23rd 2012.  Patient presents today after a long hiatus with complaints of severe pelvic pain and rectal bleeding. She was seen in emergency room on 12/30/2011 reporting lower abdominal pain for approximately one year.  The pain is was noted to radiate to the suprapubic region and the right and left lower quadrants. She also reports intermittent bloody rectal mucus or bloody stools over the same period of time.  A CT of the abdomen and pelvis with contrast dated 11/14/2011 is notable for a prominent endometrium. A small amount of free pelvic fluid and a few small positive pelvic lymph nodes were appreciated  otherwise has no acute abdominal findings or evidence of metastatic disease. The patient states that the pain is quite significant and controlled only with Percocet and the use of significant marijuana .    Past Medical History  Diagnosis Date  . Hypertension   . Cervical cancer     Past Surgical History  Procedure Date  . Cholecystectomy      PAST GYNECOLOGICAL HISTORY:  NSVD x7 with 6 vaginal deliveries, 1 twin delivery.  Menarche occurred at the age of 67 with regular menses.  SOCIAL HISTORY:  Half a pack per day of tobacco use for 15 years.  She resides with the father of her children, the eldest of whom is 82 and the youngest is 2, there are 4 boys and 4 girls.  REVIEW OF SYSTEMS:  No nausea or vomiting, abdominal severe pain. Occasional rectal bleeding over the last year .  Denies weight loss , negative vaginal bleeding , denies hematuria.   Otherwise, 10-point review of systems negative.  PHYSICAL EXAMINATION:  GENERAL:  A well-developed pleasant female, in no acute distress. BP 120/90  Pulse 68  Temp(Src) 97.9 F (36.6 C) (Oral)  Resp 16  Ht 5' 4.57" (1.64 m)  Wt 158 lb 14.4 oz (72.077 kg)  BMI 26.80 kg/m2 LYMPH NODE SURVEY:  No cervical, supraclavicular, or inguinal adenopathy. HEENT:  Head, atraumatic and normocephalic. CHEST:  Clear to auscultation. HEART:  Regular rate and rhythm. ABDOMEN:  Soft, obese.  No palpable masses.  Lower abdominal discomfort. PELVIC:  Normal external genitalia, Bartholin's, urethra, and Skene's. The  cervix is flush with the vaginal vault. Pap test was collected and the cervix was noted to be friable. There was no parametrial nodularity.  No uterosacral nodularity is noted. RECTAL:  Good anal sphincter tone with an enlarged uterus.  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  Status post definitive radiation therapy with sensitizing cisplatin.  Patient now with significant pelvic pain. Pap test was collected. MS  Contin was prescribed to better optimize the pain.  Patient was counseled that the pain may be because of a developing fistula as a result of radiation therapy. 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   Options presented to the patient were that of one radical hysterectomy with bilateral pelvic periaortic lymph node dissection and possible ovarian translocation.  Given the size of this lesion and the lack of mobility, particularly on the left within the parametrial area, it is quite likely that she will require radiation therapy.  As such, I have recommended chemoradiation with cisplatin for chemosensitization. I have also requested a PET scan to assess for FDG uptake of the common iliac and periaortic lymph nodes.  I have also recommended that she be seen by Dr. Roselind Messier for definitive radiotherapy and to Dr. Jama Flavors for cisplatin.  She has been advised to stop or reduce her tobacco use, particularly during treatment.  Her cousin was present at the visit and I have asked the cousin to be supportive and ensure that Ms. Achee meets all her radiation appointments.

## 2012-01-07 NOTE — Patient Instructions (Signed)
Followup with GI for evaluation of radiation proctitis.  Followup with GYN oncology in 3 months

## 2012-01-14 ENCOUNTER — Telehealth: Payer: Self-pay | Admitting: Gynecologic Oncology

## 2012-01-14 ENCOUNTER — Encounter: Payer: Self-pay | Admitting: Gynecologic Oncology

## 2012-01-14 NOTE — Telephone Encounter (Signed)
Patient was seen in the clinic on April 16th, 2013 and was given MS Contin 15mg  BID and instructed to take half along with #6 Percocet until Medicaid approved the morphine.  Pt called today stating that "the medication is not working" and that she is experiencing "abdominal pain at a 5 or 6 after 6 hours" from the morphine administration. Reporting that ibuprofen is not helping. The patient is reporting that she has about 50 pills left with the morphine prescription.  Dr. Nelly Rout was notified of the situation and called the patient on 01/14/12 with the recommendation to increase her MS Contin dose to 15mg  in the am and 30mg  in the pm.

## 2012-01-14 NOTE — Progress Notes (Signed)
Recommendation at visit made by Dr. Laurette Schimke in GYN Oncology for patient to have a referral to GI for radiation proctitis and abdominal pain.  Spoke with [facility] who stated that the referral must be made by the provider listed on the patient's [place] Access Card (Dr. Concepcion Elk).  Spoke with a representative at the [facility] and request faxed to Francee Piccolo, referrals coordinator, to have an appointment/referral arranged for the patient.  Pt notified of the above situation.

## 2012-01-21 ENCOUNTER — Telehealth: Payer: Self-pay | Admitting: Gynecologic Oncology

## 2012-01-21 NOTE — Telephone Encounter (Signed)
Pt informed of pap smear results: negative.  Pt stating that she has not received any notification from Dr. Albertina Parr office about an appointment for a GI consult.  Message left with Francee Piccolo, referral coordinator, at Dr. Albertina Parr office to follow up on the status of the GI appt.

## 2012-01-21 NOTE — Telephone Encounter (Signed)
Called to speak with patient about pap smear results and the status of her appointment with GI.  Unable to leave a message on her cell and unavailable at her home number.  Will try again at a later time.

## 2012-01-26 ENCOUNTER — Observation Stay (HOSPITAL_COMMUNITY)
Admission: EM | Admit: 2012-01-26 | Discharge: 2012-01-27 | Disposition: A | Discharge status: Home / Self Care | Payer: Medicaid Other | Attending: Emergency Medicine | Admitting: Emergency Medicine

## 2012-01-26 ENCOUNTER — Encounter (HOSPITAL_COMMUNITY): Payer: Self-pay | Admitting: Emergency Medicine

## 2012-01-26 DIAGNOSIS — F17200 Nicotine dependence, unspecified, uncomplicated: Secondary | ICD-10-CM | POA: Insufficient documentation

## 2012-01-26 DIAGNOSIS — I10 Essential (primary) hypertension: Secondary | ICD-10-CM | POA: Insufficient documentation

## 2012-01-26 DIAGNOSIS — R109 Unspecified abdominal pain: Secondary | ICD-10-CM | POA: Insufficient documentation

## 2012-01-26 DIAGNOSIS — R112 Nausea with vomiting, unspecified: Secondary | ICD-10-CM | POA: Insufficient documentation

## 2012-01-26 DIAGNOSIS — N12 Tubulo-interstitial nephritis, not specified as acute or chronic: Principal | ICD-10-CM | POA: Insufficient documentation

## 2012-01-26 DIAGNOSIS — Z8541 Personal history of malignant neoplasm of cervix uteri: Secondary | ICD-10-CM | POA: Insufficient documentation

## 2012-01-26 LAB — URINALYSIS, ROUTINE W REFLEX MICROSCOPIC
Glucose, UA: NEGATIVE mg/dL
Ketones, ur: 15 mg/dL — AB
Nitrite: NEGATIVE
Protein, ur: 30 mg/dL — AB
pH: 7.5 (ref 5.0–8.0)

## 2012-01-26 LAB — BASIC METABOLIC PANEL
CO2: 22 mEq/L (ref 19–32)
Chloride: 100 mEq/L (ref 96–112)
GFR calc Af Amer: >90 mL/min (ref >90)
Potassium: 3.9 mEq/L (ref 3.5–5.1)
Sodium: 136 mEq/L (ref 135–145)

## 2012-01-26 LAB — URINE MICROSCOPIC-ADD ON

## 2012-01-26 LAB — CBC
HCT: 38.7 % (ref 36.0–46.0)
Hemoglobin: 13.5 g/dL (ref 12.0–15.0)
MCHC: 34.9 g/dL (ref 30.0–36.0)
RDW: 13.7 % (ref 11.5–15.5)
WBC: 12.3 10*3/uL — ABNORMAL HIGH (ref 4.0–10.5)

## 2012-01-26 LAB — DIFFERENTIAL
Basophils Absolute: 0 10*3/uL (ref 0.0–0.1)
Basophils Relative: 0 % (ref 0–1)
Lymphocytes Relative: 16 % (ref 12–46)
Neutro Abs: 9.1 10*3/uL — ABNORMAL HIGH (ref 1.7–7.7)
Neutrophils Relative %: 74 % (ref 43–77)

## 2012-01-26 LAB — POCT PREGNANCY, URINE: Preg Test, Ur: NEGATIVE

## 2012-01-26 MED ORDER — ZOLPIDEM TARTRATE 5 MG PO TABS: 5.0000 mg | ORAL_TABLET | Freq: Every evening | ORAL | Status: DC | PRN

## 2012-01-26 MED ORDER — SODIUM CHLORIDE 0.9 % IV SOLN
1000.0000 mL | INTRAVENOUS | Status: DC
  Administered 2012-01-26: 1000 mL via INTRAVENOUS

## 2012-01-26 MED ORDER — HYDROMORPHONE HCL PF 1 MG/ML IJ SOLN
1.0000 mg | Freq: Four times a day (QID) | INTRAMUSCULAR | Status: DC | PRN
  Administered 2012-01-27: 1 mg via INTRAVENOUS
  Filled 2012-01-26: qty 1

## 2012-01-26 MED ORDER — IBUPROFEN 200 MG PO TABS
600.0000 mg | ORAL_TABLET | Freq: Three times a day (TID) | ORAL | Status: DC
  Administered 2012-01-26 – 2012-01-27 (×2): 600 mg via ORAL
  Filled 2012-01-26 (×2): qty 3

## 2012-01-26 MED ORDER — ACETAMINOPHEN 325 MG PO TABS: 650.0000 mg | ORAL_TABLET | ORAL | Status: DC | PRN

## 2012-01-26 MED ORDER — SODIUM CHLORIDE 0.9 % IV BOLUS (SEPSIS)
1000.0000 mL | Freq: Once | INTRAVENOUS | Status: AC
  Administered 2012-01-26: 1000 mL via INTRAVENOUS

## 2012-01-26 MED ORDER — ONDANSETRON HCL 4 MG/2ML IJ SOLN
4.0000 mg | Freq: Once | INTRAMUSCULAR | Status: AC
  Administered 2012-01-26: 4 mg via INTRAVENOUS
  Filled 2012-01-26: qty 2

## 2012-01-26 MED ORDER — KETOROLAC TROMETHAMINE 30 MG/ML IJ SOLN
30.0000 mg | Freq: Once | INTRAMUSCULAR | Status: AC
  Administered 2012-01-26: 30 mg via INTRAVENOUS
  Filled 2012-01-26: qty 1

## 2012-01-26 MED ORDER — ONDANSETRON HCL 4 MG/2ML IJ SOLN
4.0000 mg | Freq: Four times a day (QID) | INTRAMUSCULAR | Status: DC | PRN
  Filled 2012-01-26: qty 2

## 2012-01-26 MED ORDER — DEXTROSE 5 % IV SOLN
1.0000 g | Freq: Once | INTRAVENOUS | Status: AC
  Administered 2012-01-26: 1 g via INTRAVENOUS
  Filled 2012-01-26: qty 10

## 2012-01-26 MED ORDER — DEXTROSE 5 % IV SOLN
1.0000 g | Freq: Once | INTRAVENOUS | Status: AC
  Administered 2012-01-27: 1 g via INTRAVENOUS
  Filled 2012-01-26: qty 10

## 2012-01-26 MED ORDER — HYDROMORPHONE HCL PF 1 MG/ML IJ SOLN
1.0000 mg | Freq: Once | INTRAMUSCULAR | Status: AC
  Administered 2012-01-26: 1 mg via INTRAVENOUS
  Filled 2012-01-26: qty 1

## 2012-01-26 NOTE — ED Provider Notes (Signed)
History  Scribed for No att. providers found, the patient was seen in room STRE7/STRE7. This chart was scribed by Candelaria Stagers. The patient's care started at 7:25 PM  ed  CSN: [id number]  Arrival date & time 01/26/12  1908   None     Chief Complaint  Patient presents with  . Emesis     HPI Vanessa [name] is a 36 y.o. female who presents to the Emergency Department complaining of emesis that started yesterday.  Pt states that she cannot keep anything down including liquids.  She is also experiencing abdominal pain, lower back pain, chills, and diaphoresis.  She denies dysuria.  Pt h/o cervical cancer and has had radiation for this.  She h/o HTN and cholecystectomy.  Nothing seems to make the sx better or worse.   Past Medical History  Diagnosis Date  . Hypertension   . Cervical cancer     Past Surgical History  Procedure Date  . Cholecystectomy     No family history on file.  History  Substance Use Topics  . Smoking status: Current Everyday Smoker -- 0.5 packs/day  . Smokeless tobacco: Not on file  . Alcohol Use: No    OB History    Grav Para Term Preterm Abortions TAB SAB Ect Mult Living   10 8              Review of Systems  Constitutional: Positive for chills and diaphoresis. Negative for fever.  HENT: Negative.   Eyes: Negative.  Negative for discharge and redness.  Respiratory: Negative.  Negative for cough and shortness of breath.   Cardiovascular: Negative.  Negative for chest pain.  Gastrointestinal: Positive for nausea, vomiting and abdominal pain. Negative for diarrhea.  Genitourinary: Negative.  Negative for dysuria, frequency and vaginal discharge.  Musculoskeletal: Positive for back pain.  Skin: Negative.  Negative for color change and rash.  Neurological: Negative.  Negative for syncope and headaches.  Hematological: Negative.  Negative for adenopathy.  Psychiatric/Behavioral: Negative.  Negative for confusion.  All other systems reviewed  and are negative.    Allergies  Morphine and related  Home Medications   Current Outpatient Rx  Name Route Sig Dispense Refill  . HYDROCHLOROTHIAZIDE 12.5 MG PO TABS Oral Take 12.5 mg by mouth daily.    . OXYCODONE-ACETAMINOPHEN 5-325 MG PO TABS Oral Take 1 tablet by mouth 2 (two) times daily as needed. For pain      BP 131/97  Pulse 99  Temp(Src) 97.4 F (36.3 C) (Oral)  Resp 20  SpO2 100%  Physical Exam  Nursing note and vitals reviewed. Constitutional: She is oriented to person, place, and time. She appears well-developed and well-nourished. No distress.  HENT:  Head: Normocephalic and atraumatic.  Eyes: Conjunctivae and EOM are normal. Pupils are equal, round, and reactive to light.  Neck: Normal range of motion. Neck supple.  Cardiovascular: Normal rate and regular rhythm.   Pulmonary/Chest: Effort normal and breath sounds normal. She has no wheezes.  Abdominal: Soft. She exhibits no distension. There is no tenderness. There is no rebound and no guarding.  Musculoskeletal: Normal range of motion. She exhibits no edema.       Left CVA tenderness.  No Right CVA tenderness.   Neurological: She is alert and oriented to person, place, and time.  Skin: Skin is warm and dry. She is not diaphoretic.  Psychiatric: She has a normal mood and affect. Her behavior is normal. Judgment and thought content normal.  ED Course  Procedures  DIAGNOSTIC STUDIES: Oxygen Saturation is 100% on room air, normal by my interpretation.    COORDINATION OF CARE:  7:27PM Ordered: Urinalysis, Routine w reflex microscopic, Differential, Basis metabolic panel, CBC    Labs Reviewed - No data to display No results found.   No diagnosis found.    MDM  Patient with abdominal pain and nausea and vomiting since yesterday.  This may be a viral gastroenteritis as the patient has a benign abdominal exam at this time.  She also has CVA tenderness on exam which raises a question of possible  pyelonephritis.  We are pending a urinalysis at this time.  We will start an IV on this patient and administer IV fluids for hydration as well as Zofran for nausea and Dilaudid for pain.  Patient will be reassessed after these interventions and her laboratory studies have returned for further management decisions.  I personally performed the services described in this documentation, which was scribed in my presence. The recorded information has been reviewed and considered.        Nat Christen, MD 01/26/12 [phone]

## 2012-01-26 NOTE — ED Provider Notes (Addendum)
9:07 PM Patient signed out to me by Dr. Golda Acre. Plan was to wait for urinalysis to return. Suspected pyelonephritis. Patient continues to have pain with palpation of lower abdomen and bilateral CVA tenderness. UA indicates UTI. Will place on pyelonephritis protocol.  2:25 AM pain improved. Planned to have another dose of rocephin at 5:00AM and will d/c in the morning. Discussed with patient who agrees with plan.   Results for orders placed during the hospital encounter of 01/26/12  URINALYSIS, ROUTINE W REFLEX MICROSCOPIC      Component Value Range   Color, Urine AMBER (*) YELLOW    APPearance CLOUDY (*) CLEAR    Specific Gravity, Urine 1.029  1.005 - 1.030    pH 7.5  5.0 - 8.0    Glucose, UA NEGATIVE  NEGATIVE (mg/dL)   Hgb urine dipstick NEGATIVE  NEGATIVE    Bilirubin Urine SMALL (*) NEGATIVE    Ketones, ur 15 (*) NEGATIVE (mg/dL)   Protein, ur 30 (*) NEGATIVE (mg/dL)   Urobilinogen, UA 2.0 (*) 0.0 - 1.0 (mg/dL)   Nitrite NEGATIVE  NEGATIVE    Leukocytes, UA LARGE (*) NEGATIVE   CBC      Component Value Range   WBC 12.3 (*) 4.0 - 10.5 (K/uL)   RBC 4.31  3.87 - 5.11 (MIL/uL)   Hemoglobin 13.5  12.0 - 15.0 (g/dL)   HCT 65.7  84.6 - 96.2 (%)   MCV 89.8  78.0 - 100.0 (fL)   MCH 31.3  26.0 - 34.0 (pg)   MCHC 34.9  30.0 - 36.0 (g/dL)   RDW 95.2  84.1 - 32.4 (%)   Platelets 292  150 - 400 (K/uL)  DIFFERENTIAL      Component Value Range   Neutrophils Relative 74  43 - 77 (%)   Neutro Abs 9.1 (*) 1.7 - 7.7 (K/uL)   Lymphocytes Relative 16  12 - 46 (%)   Lymphs Abs 1.9  0.7 - 4.0 (K/uL)   Monocytes Relative 10  3 - 12 (%)   Monocytes Absolute 1.2 (*) 0.1 - 1.0 (K/uL)   Eosinophils Relative 1  0 - 5 (%)   Eosinophils Absolute 0.1  0.0 - 0.7 (K/uL)   Basophils Relative 0  0 - 1 (%)   Basophils Absolute 0.0  0.0 - 0.1 (K/uL)  BASIC METABOLIC PANEL      Component Value Range   Sodium 136  135 - 145 (mEq/L)   Potassium 3.9  3.5 - 5.1 (mEq/L)   Chloride 100  96 - 112 (mEq/L)   CO2  22  19 - 32 (mEq/L)   Glucose, Bld 119 (*) 70 - 99 (mg/dL)   BUN 7  6 - 23 (mg/dL)   Creatinine, Ser 4.01  0.50 - 1.10 (mg/dL)   Calcium 02.7 (*) 8.4 - 10.5 (mg/dL)   GFR calc non Af Amer >90  >90 (mL/min)   GFR calc Af Amer >90  >90 (mL/min)  POCT PREGNANCY, URINE      Component Value Range   Preg Test, Ur NEGATIVE  NEGATIVE   URINE MICROSCOPIC-ADD ON      Component Value Range   Squamous Epithelial / LPF FEW (*) RARE    WBC, UA 21-50  <3 (WBC/hpf)   Bacteria, UA FEW (*) RARE    Urine-Other MUCOUS PRESENT          Thomasene Lot, PA-C 01/26/12 2112  Thomasene Lot, PA-C 01/27/12 2536

## 2012-01-26 NOTE — ED Provider Notes (Signed)
Medical screening examination/treatment/procedure(s) were performed by non-physician practitioner and as supervising physician I was immediately available for consultation/collaboration.  Mikita Lesmeister R. Chantae Soo, MD 01/26/12 2322 

## 2012-01-26 NOTE — ED Notes (Signed)
C/o nausea, vomiting, and lower abd pain that radiates to lower back since yesterday.

## 2012-01-27 ENCOUNTER — Encounter (HOSPITAL_COMMUNITY): Payer: Self-pay | Admitting: *Deleted

## 2012-01-27 ENCOUNTER — Inpatient Hospital Stay (HOSPITAL_COMMUNITY)
Admission: EM | Admit: 2012-01-27 | Discharge: 2012-03-12 | DRG: 329 | Disposition: A | Discharge status: Home Health | Payer: Medicaid Other | Attending: Internal Medicine | Admitting: Internal Medicine

## 2012-01-27 DIAGNOSIS — N12 Tubulo-interstitial nephritis, not specified as acute or chronic: Secondary | ICD-10-CM

## 2012-01-27 DIAGNOSIS — Z8541 Personal history of malignant neoplasm of cervix uteri: Secondary | ICD-10-CM

## 2012-01-27 DIAGNOSIS — K560 Paralytic ileus: Secondary | ICD-10-CM | POA: Diagnosis not present

## 2012-01-27 DIAGNOSIS — R112 Nausea with vomiting, unspecified: Secondary | ICD-10-CM | POA: Diagnosis present

## 2012-01-27 DIAGNOSIS — K520 Gastroenteritis and colitis due to radiation: Secondary | ICD-10-CM | POA: Diagnosis present

## 2012-01-27 DIAGNOSIS — I10 Essential (primary) hypertension: Secondary | ICD-10-CM | POA: Diagnosis present

## 2012-01-27 DIAGNOSIS — K651 Peritoneal abscess: Secondary | ICD-10-CM | POA: Clinically undetermined

## 2012-01-27 DIAGNOSIS — N10 Acute tubulo-interstitial nephritis: Secondary | ICD-10-CM | POA: Diagnosis present

## 2012-01-27 DIAGNOSIS — K5909 Other constipation: Secondary | ICD-10-CM | POA: Diagnosis present

## 2012-01-27 DIAGNOSIS — Y92009 Unspecified place in unspecified non-institutional (private) residence as the place of occurrence of the external cause: Secondary | ICD-10-CM

## 2012-01-27 DIAGNOSIS — E876 Hypokalemia: Secondary | ICD-10-CM | POA: Diagnosis present

## 2012-01-27 DIAGNOSIS — G8929 Other chronic pain: Secondary | ICD-10-CM | POA: Diagnosis present

## 2012-01-27 DIAGNOSIS — R040 Epistaxis: Secondary | ICD-10-CM | POA: Diagnosis not present

## 2012-01-27 DIAGNOSIS — K5903 Drug induced constipation: Secondary | ICD-10-CM

## 2012-01-27 DIAGNOSIS — T81329A Deep disruption or dehiscence of operation wound, unspecified, initial encounter: Secondary | ICD-10-CM | POA: Diagnosis not present

## 2012-01-27 DIAGNOSIS — Z9089 Acquired absence of other organs: Secondary | ICD-10-CM

## 2012-01-27 DIAGNOSIS — K625 Hemorrhage of anus and rectum: Secondary | ICD-10-CM | POA: Diagnosis present

## 2012-01-27 DIAGNOSIS — T66XXXS Radiation sickness, unspecified, sequela: Secondary | ICD-10-CM

## 2012-01-27 DIAGNOSIS — E871 Hypo-osmolality and hyponatremia: Secondary | ICD-10-CM | POA: Diagnosis not present

## 2012-01-27 DIAGNOSIS — T8140XA Infection following a procedure, unspecified, initial encounter: Secondary | ICD-10-CM | POA: Diagnosis not present

## 2012-01-27 DIAGNOSIS — R933 Abnormal findings on diagnostic imaging of other parts of digestive tract: Secondary | ICD-10-CM

## 2012-01-27 DIAGNOSIS — D62 Acute posthemorrhagic anemia: Secondary | ICD-10-CM | POA: Diagnosis present

## 2012-01-27 DIAGNOSIS — Z9049 Acquired absence of other specified parts of digestive tract: Secondary | ICD-10-CM

## 2012-01-27 DIAGNOSIS — F17200 Nicotine dependence, unspecified, uncomplicated: Secondary | ICD-10-CM | POA: Diagnosis present

## 2012-01-27 DIAGNOSIS — K56699 Other intestinal obstruction unspecified as to partial versus complete obstruction: Secondary | ICD-10-CM | POA: Diagnosis present

## 2012-01-27 DIAGNOSIS — Y842 Radiological procedure and radiotherapy as the cause of abnormal reaction of the patient, or of later complication, without mention of misadventure at the time of the procedure: Secondary | ICD-10-CM | POA: Diagnosis present

## 2012-01-27 DIAGNOSIS — Y832 Surgical operation with anastomosis, bypass or graft as the cause of abnormal reaction of the patient, or of later complication, without mention of misadventure at the time of the procedure: Secondary | ICD-10-CM | POA: Diagnosis not present

## 2012-01-27 DIAGNOSIS — T8132XA Disruption of internal operation (surgical) wound, not elsewhere classified, initial encounter: Secondary | ICD-10-CM | POA: Diagnosis not present

## 2012-01-27 DIAGNOSIS — Z888 Allergy status to other drugs, medicaments and biological substances status: Secondary | ICD-10-CM

## 2012-01-27 DIAGNOSIS — K929 Disease of digestive system, unspecified: Secondary | ICD-10-CM | POA: Diagnosis not present

## 2012-01-27 DIAGNOSIS — D6959 Other secondary thrombocytopenia: Secondary | ICD-10-CM | POA: Diagnosis not present

## 2012-01-27 DIAGNOSIS — Z789 Other specified health status: Secondary | ICD-10-CM | POA: Diagnosis not present

## 2012-01-27 DIAGNOSIS — C539 Malignant neoplasm of cervix uteri, unspecified: Secondary | ICD-10-CM

## 2012-01-27 DIAGNOSIS — Y9210 Unspecified residential institution as the place of occurrence of the external cause: Secondary | ICD-10-CM | POA: Diagnosis not present

## 2012-01-27 DIAGNOSIS — Z885 Allergy status to narcotic agent status: Secondary | ICD-10-CM

## 2012-01-27 DIAGNOSIS — K9189 Other postprocedural complications and disorders of digestive system: Secondary | ICD-10-CM | POA: Diagnosis not present

## 2012-01-27 DIAGNOSIS — Z9221 Personal history of antineoplastic chemotherapy: Secondary | ICD-10-CM

## 2012-01-27 DIAGNOSIS — K6289 Other specified diseases of anus and rectum: Secondary | ICD-10-CM | POA: Diagnosis present

## 2012-01-27 DIAGNOSIS — R1084 Generalized abdominal pain: Secondary | ICD-10-CM

## 2012-01-27 DIAGNOSIS — D696 Thrombocytopenia, unspecified: Secondary | ICD-10-CM

## 2012-01-27 DIAGNOSIS — K567 Ileus, unspecified: Secondary | ICD-10-CM | POA: Diagnosis not present

## 2012-01-27 LAB — CBC
HCT: 33.7 % — ABNORMAL LOW (ref 36.0–46.0)
Hemoglobin: 11.9 g/dL — ABNORMAL LOW (ref 12.0–15.0)
RBC: 3.77 MIL/uL — ABNORMAL LOW (ref 3.87–5.11)
RDW: 13.8 % (ref 11.5–15.5)
WBC: 11.5 10*3/uL — ABNORMAL HIGH (ref 4.0–10.5)

## 2012-01-27 LAB — BASIC METABOLIC PANEL
BUN: 3 mg/dL — ABNORMAL LOW (ref 6–23)
Chloride: 101 mEq/L (ref 96–112)
GFR calc Af Amer: >90 mL/min (ref >90)
Glucose, Bld: 104 mg/dL — ABNORMAL HIGH (ref 70–99)
Potassium: 4 mEq/L (ref 3.5–5.1)

## 2012-01-27 MED ORDER — CEPHALEXIN 500 MG PO CAPS: 500.0000 mg | ORAL_CAPSULE | Freq: Four times a day (QID) | ORAL | Status: DC

## 2012-01-27 MED ORDER — IBUPROFEN 800 MG PO TABS: 800.0000 mg | ORAL_TABLET | Freq: Three times a day (TID) | ORAL | Status: DC

## 2012-01-27 MED ORDER — MORPHINE SULFATE 4 MG/ML IJ SOLN
4.0000 mg | Freq: Once | INTRAMUSCULAR | Status: DC
  Filled 2012-01-27: qty 1

## 2012-01-27 MED ORDER — ONDANSETRON HCL 4 MG PO TABS: 4.0000 mg | ORAL_TABLET | Freq: Four times a day (QID) | ORAL | Status: DC | PRN

## 2012-01-27 MED ORDER — BIOTENE DRY MOUTH MT LIQD
15.0000 mL | Freq: Two times a day (BID) | OROMUCOSAL | Status: DC
  Administered 2012-01-27 – 2012-01-29 (×5): 15 mL via OROMUCOSAL

## 2012-01-27 MED ORDER — ONDANSETRON HCL 4 MG/2ML IJ SOLN: 4.0000 mg | Freq: Three times a day (TID) | INTRAMUSCULAR | Status: AC | PRN

## 2012-01-27 MED ORDER — CHLORHEXIDINE GLUCONATE 0.12 % MT SOLN
15.0000 mL | Freq: Two times a day (BID) | OROMUCOSAL | Status: DC
  Administered 2012-01-28 – 2012-02-15 (×31): 15 mL via OROMUCOSAL
  Filled 2012-01-27 (×42): qty 15

## 2012-01-27 MED ORDER — SODIUM CHLORIDE 0.9 % IV SOLN
INTRAVENOUS | Status: AC
  Administered 2012-01-27: 21:00:00 via INTRAVENOUS

## 2012-01-27 MED ORDER — ONDANSETRON HCL 4 MG/2ML IJ SOLN
4.0000 mg | Freq: Once | INTRAMUSCULAR | Status: AC
  Administered 2012-01-27: 4 mg via INTRAVENOUS

## 2012-01-27 MED ORDER — TRAMADOL HCL 50 MG PO TABS: 50.0000 mg | ORAL_TABLET | Freq: Four times a day (QID) | ORAL | Status: DC | PRN

## 2012-01-27 MED ORDER — DEXTROSE 5 % IV SOLN
1.0000 g | INTRAVENOUS | Status: AC
  Administered 2012-01-28 – 2012-01-29 (×2): 1 g via INTRAVENOUS
  Filled 2012-01-27 (×4): qty 10

## 2012-01-27 MED ORDER — OXYCODONE-ACETAMINOPHEN 5-325 MG PO TABS: 1.0000 | ORAL_TABLET | Freq: Four times a day (QID) | ORAL | Status: DC | PRN

## 2012-01-27 MED ORDER — DEXTROSE 5 % IV SOLN
1.0000 g | Freq: Once | INTRAVENOUS | Status: AC
  Administered 2012-01-27: 1 g via INTRAVENOUS
  Filled 2012-01-27: qty 10

## 2012-01-27 MED ORDER — HYDROMORPHONE HCL PF 1 MG/ML IJ SOLN
1.0000 mg | Freq: Once | INTRAMUSCULAR | Status: AC
  Administered 2012-01-27: 1 mg via INTRAVENOUS
  Filled 2012-01-27: qty 1

## 2012-01-27 MED ORDER — ONDANSETRON HCL 4 MG/2ML IJ SOLN
4.0000 mg | Freq: Once | INTRAMUSCULAR | Status: AC
  Administered 2012-01-27: 4 mg via INTRAVENOUS
  Filled 2012-01-27: qty 2

## 2012-01-27 MED ORDER — OXYCODONE-ACETAMINOPHEN 5-325 MG PO TABS
2.0000 | ORAL_TABLET | Freq: Once | ORAL | Status: AC
  Administered 2012-01-27: 2 via ORAL
  Filled 2012-01-27: qty 2

## 2012-01-27 MED ORDER — SODIUM CHLORIDE 0.9 % IV BOLUS (SEPSIS)
1000.0000 mL | Freq: Once | INTRAVENOUS | Status: AC
  Administered 2012-01-27: 1000 mL via INTRAVENOUS

## 2012-01-27 MED ORDER — DEXTROSE-NACL 5-0.9 % IV SOLN
INTRAVENOUS | Status: DC
  Administered 2012-01-27 – 2012-01-28 (×3): via INTRAVENOUS

## 2012-01-27 MED ORDER — HYDROMORPHONE HCL PF 1 MG/ML IJ SOLN
1.0000 mg | INTRAMUSCULAR | Status: DC | PRN
  Administered 2012-01-27 – 2012-01-28 (×4): 1 mg via INTRAVENOUS
  Filled 2012-01-27 (×4): qty 1

## 2012-01-27 MED ORDER — ONDANSETRON HCL 4 MG/2ML IJ SOLN
4.0000 mg | Freq: Four times a day (QID) | INTRAMUSCULAR | Status: DC | PRN
  Administered 2012-02-10 – 2012-03-08 (×11): 4 mg via INTRAVENOUS
  Filled 2012-01-27 (×14): qty 2

## 2012-01-27 MED ORDER — HYDROMORPHONE HCL PF 1 MG/ML IJ SOLN: 1.0000 mg | INTRAMUSCULAR | Status: DC | PRN

## 2012-01-27 NOTE — ED Notes (Signed)
Patient to the restroom at this time.

## 2012-01-27 NOTE — ED Notes (Signed)
Per ems- pt dx with uti yesterday. Pt given prescriptions for zofran, percocet, and cephalexin. Pt states she has been unable to keep any medicine down and has been vomiting all day. Pt c/o pain 10/10. Pt denies diarrhea. NAD noted by EMS, states pt ambulated without any difficulty.

## 2012-01-27 NOTE — ED Notes (Signed)
Report received, assumed care.  

## 2012-01-27 NOTE — ED Notes (Signed)
Patient requesting pain medication at this time; RN informed

## 2012-01-27 NOTE — ED Notes (Signed)
Pt vomited in hallway x4

## 2012-01-27 NOTE — ED Provider Notes (Signed)
History     CSN: [id number]  Arrival date & time 01/27/12  1807   First MD Initiated Contact with Patient 01/27/12 1811      Chief Complaint  Patient presents with  . Emesis    (Consider location/radiation/quality/duration/timing/severity/associated sxs/prior treatment) HPI  Patient presents to the ED with complaints of irretractable vomiting. The patient was seen here in the ED last night and held on CDU protocol till this morning. She was diagnosed with Pyelonephritis given Percocet, Keflex, and Zofran. Since being at home she has been unable to keep any of her medications down and continues to vomit. Before having the chance to go into the exam room, I hear her retching. Her pain is the same quality as it was before and has not changed in quality or location.  Past Medical History  Diagnosis Date  . Hypertension   . Cervical cancer     Past Surgical History  Procedure Date  . Cholecystectomy     No family history on file.  History  Substance Use Topics  . Smoking status: Current Everyday Smoker -- 0.5 packs/day  . Smokeless tobacco: Not on file  . Alcohol Use: No    OB History    Grav Para Term Preterm Abortions TAB SAB Ect Mult Living   10 8              Review of Systems   HEENT: denies blurry vision or change in hearing PULMONARY: Denies difficulty breathing and SOB CARDIAC: denies chest pain or heart palpitations MUSCULOSKELETAL:  denies being unable to ambulate ABDOMEN AL: denies diarrhea GU: denies loss of bowel or urinary control NEURO: denies numbness and tingling in extremities   Allergies  Morphine and related  Home Medications   Current Outpatient Rx  Name Route Sig Dispense Refill  . CEPHALEXIN 500 MG PO CAPS Oral Take 500 mg by mouth 4 (four) times daily. Started today and ends on the 16th.    Marland Kitchen HYDROCHLOROTHIAZIDE 12.5 MG PO TABS Oral Take 12.5 mg by mouth daily.    . IBUPROFEN 800 MG PO TABS Oral Take 800 mg by mouth 3 (three) times  daily.    . MORPHINE SULFATE 15 MG PO TABS Oral Take 15-30 mg by mouth 2 (two) times daily. 15 mg every morning and 30 mg at night    . ONDANSETRON HCL 4 MG PO TABS Oral Take 4 mg by mouth every 6 (six) hours as needed. For nausea.    . OXYCODONE-ACETAMINOPHEN 5-325 MG PO TABS Oral Take 1-2 tablets by mouth every 6 (six) hours as needed. For pain.      BP 153/83  Pulse 62  Temp(Src) 98.6 F (37 C) (Oral)  Resp 18  SpO2 99%  Physical Exam  Nursing note and vitals reviewed. Constitutional: She appears well-developed and well-nourished. No distress.  HENT:  Head: Normocephalic and atraumatic.  Eyes: Pupils are equal, round, and reactive to light.  Neck: Normal range of motion. Neck supple.  Cardiovascular: Normal rate and regular rhythm.   Pulmonary/Chest: Effort normal.  Abdominal: Soft. There is tenderness in the suprapubic area. There is CVA tenderness. There is no rigidity, no rebound and no guarding.    Neurological: She is alert.  Skin: Skin is warm and dry.    ED Course  Procedures (including critical care time)  Labs Reviewed  CBC - Abnormal; Notable for the following:    WBC 11.5 (*)    RBC 3.77 (*)    Hemoglobin 11.9 (*)  HCT 33.7 (*)    All other components within normal limits  BASIC METABOLIC PANEL - Abnormal; Notable for the following:    Glucose, Bld 104 (*)    BUN 3 (*)    Calcium 10.7 (*)    All other components within normal limits   No results found.   1. Pyelonephritis       MDM  Pt given pain, nausea and antibiotics through IV. Labs have been ordered. Patient will then be admitted for failing outpatient therapy for pyelonephritis.  Inpatient, med/surg, Team 5, Dr. Deirdre Evener, PA 01/27/12 [phone]

## 2012-01-27 NOTE — Progress Notes (Signed)
Observation review is complete. 

## 2012-01-27 NOTE — ED Provider Notes (Signed)
7:40 AM Report received from EDP Zackowski, pt care assumed by myself at change of shift. Initial provider notes reviewed. Pt in the CDU on pyelonephritis protocol after presenting to ED yesterday with left flank/lower abd pain and emesis. Has done well overnight, currently tolerating clear PO. Has received 2 IV doses of rocephin. On my assessment, pt is awake, alert, oriented, and mildly uncomfortable appearing. Lungs CTAB. Heart RRR. Abd soft, ND. Mild suprapubic TTP. Left CVAT, no right CVAT. Speech clear. MAEW.   Will attempt to change pt to PO pain medication at this time. If she can tolerate PO and her pain is brought down to a reasonable level, she can be discharged home. Pt agreeable with this plan.      9:40 AM Good pain improvement with PO medication, pt continued to tolerate PO. Will d.c home. Return precautions discussed.  [address] [place], [place] 01/27/12 [phone]

## 2012-01-28 ENCOUNTER — Inpatient Hospital Stay (HOSPITAL_COMMUNITY): Payer: Medicaid Other

## 2012-01-28 DIAGNOSIS — R1084 Generalized abdominal pain: Secondary | ICD-10-CM

## 2012-01-28 DIAGNOSIS — R112 Nausea with vomiting, unspecified: Secondary | ICD-10-CM

## 2012-01-28 DIAGNOSIS — N12 Tubulo-interstitial nephritis, not specified as acute or chronic: Secondary | ICD-10-CM

## 2012-01-28 LAB — BASIC METABOLIC PANEL
CO2: 23 mEq/L (ref 19–32)
Chloride: 103 mEq/L (ref 96–112)
Glucose, Bld: 117 mg/dL — ABNORMAL HIGH (ref 70–99)
Potassium: 3 mEq/L — ABNORMAL LOW (ref 3.5–5.1)
Sodium: 135 mEq/L (ref 135–145)

## 2012-01-28 LAB — CBC
HCT: 31.8 % — ABNORMAL LOW (ref 36.0–46.0)
Hemoglobin: 10.9 g/dL — ABNORMAL LOW (ref 12.0–15.0)
MCH: 30.7 pg (ref 26.0–34.0)
MCV: 89.6 fL (ref 78.0–100.0)
RBC: 3.55 MIL/uL — ABNORMAL LOW (ref 3.87–5.11)

## 2012-01-28 LAB — URINE CULTURE

## 2012-01-28 MED ORDER — MORPHINE SULFATE ER 15 MG PO TBCR
30.0000 mg | EXTENDED_RELEASE_TABLET | Freq: Every day | ORAL | Status: DC
  Administered 2012-01-28 – 2012-02-03 (×7): 30 mg via ORAL
  Filled 2012-01-28 (×7): qty 2

## 2012-01-28 MED ORDER — IOHEXOL 300 MG/ML  SOLN
20.0000 mL | INTRAMUSCULAR | Status: AC
  Administered 2012-01-28 (×2): 20 mL via ORAL

## 2012-01-28 MED ORDER — MORPHINE SULFATE ER 15 MG PO TBCR
15.0000 mg | EXTENDED_RELEASE_TABLET | Freq: Every day | ORAL | Status: DC
  Administered 2012-01-28 – 2012-02-03 (×7): 15 mg via ORAL
  Filled 2012-01-28 (×4): qty 1
  Filled 2012-01-28: qty 2
  Filled 2012-01-28 (×2): qty 1

## 2012-01-28 MED ORDER — HYDROMORPHONE HCL PF 1 MG/ML IJ SOLN
0.5000 mg | INTRAMUSCULAR | Status: DC | PRN
  Administered 2012-01-28 (×2): 0.5 mg via INTRAVENOUS
  Filled 2012-01-28 (×2): qty 1

## 2012-01-28 MED ORDER — POLYETHYLENE GLYCOL 3350 17 G PO PACK
17.0000 g | PACK | Freq: Every day | ORAL | Status: DC
  Administered 2012-01-28 – 2012-01-29 (×2): 17 g via ORAL
  Filled 2012-01-28 (×3): qty 1

## 2012-01-28 MED ORDER — SODIUM CHLORIDE 0.9 % IV SOLN
INTRAVENOUS | Status: DC
  Administered 2012-01-28 – 2012-01-29 (×2): via INTRAVENOUS

## 2012-01-28 MED ORDER — HYDROMORPHONE HCL PF 1 MG/ML IJ SOLN
0.5000 mg | INTRAMUSCULAR | Status: DC | PRN
  Administered 2012-01-28 – 2012-01-31 (×15): 0.5 mg via INTRAVENOUS
  Filled 2012-01-28 (×15): qty 1

## 2012-01-28 MED ORDER — POTASSIUM CHLORIDE 10 MEQ/100ML IV SOLN
10.0000 meq | INTRAVENOUS | Status: AC
  Administered 2012-01-28 (×6): 10 meq via INTRAVENOUS
  Filled 2012-01-28 (×6): qty 100

## 2012-01-28 MED ORDER — MORPHINE SULFATE 15 MG PO TABS: 30.0000 mg | ORAL_TABLET | Freq: Every day | ORAL | Status: DC

## 2012-01-28 MED ORDER — IOHEXOL 300 MG/ML  SOLN
100.0000 mL | Freq: Once | INTRAMUSCULAR | Status: AC | PRN
  Administered 2012-01-28: 100 mL via INTRAVENOUS

## 2012-01-28 MED ORDER — MORPHINE SULFATE 15 MG PO TABS: 15.0000 mg | ORAL_TABLET | Freq: Every morning | ORAL | Status: DC

## 2012-01-28 NOTE — Progress Notes (Signed)
Utilization review complete 

## 2012-01-28 NOTE — Progress Notes (Signed)
Addendum  Patient seen and examined, chart and data base reviewed.  I agree with the above assessment and plan  For full details please see Mrs. Algis Downs PA. Note.  Clint Lipps Pager: [phone] 01/28/2012, 3:27 PM

## 2012-01-28 NOTE — Progress Notes (Addendum)
Patient ID: Vanessa [name], female   DOB: 1976-02-09, 36 y.o.   MRN: [id number] PATIENT DETAILS Name: Vanessa [name] Age: 36 y.o. Sex: female Date of Birth: 1976/01/27 Admit Date: 01/27/2012 WUJ:[id number],WGNFA A, MD, MD OB/GYN:  Dr. Nelly Rout   Interim History: Patient still with impressive LLQ pain different  - more constant than her usual pain.  Subjective: Requested additional pain medication. Still having pain.  On large doses of morphine orally at home.  Patient reported to my attending that she has been having rectal bleeding.  Objective: Weight change:   Intake/Output Summary (Last 24 hours) at 01/28/12 1510 Last data filed at 01/28/12 1300  Gross per 24 hour  Intake   1535 ml  Output   1050 ml  Net    485 ml   Blood pressure 153/91, pulse 54, temperature 98.5 F (36.9 C), temperature source Oral, resp. rate 18, height 5\' 4"  (1.626 m), weight 72 kg (158 lb 11.7 oz), SpO2 100.00%. Filed Vitals:   01/27/12 2206 01/28/12 0448 01/28/12 0613 01/28/12 1400  BP: 158/82 160/108 130/77 153/91  Pulse:  71  54  Temp:  97.6 F (36.4 C)  98.5 F (36.9 C)  TempSrc:  Oral  Oral  Resp:  16  18  Height: 5\' 4"  (1.626 m)     Weight:      SpO2:  100%  100%    Physical Exam: General: No acute distress Lungs: Clear to auscultation bilaterally without wheezes or crackles Cardiovascular: Regular rate and rhythm without murmur gallop or rub normal S1 and S2 Abdomen: exquisite tenderness in LLQ, nondistended, soft, bowel sounds positive, no rebound, no ascites, no appreciable mass Extremities: No significant cyanosis, clubbing, or edema bilateral lower extremities  Basic Metabolic Panel:  Lab 01/28/12 2130 01/27/12 1837  NA 135 135  K 3.0* 4.0  CL 103 101  CO2 23 20  GLUCOSE 117* 104*  BUN <3* 3*  CREATININE 0.44* 0.54  CALCIUM 10.0 10.7*  MG -- --  PHOS -- --   CBC:  Lab 01/28/12 0607 01/27/12 1837 01/26/12 1940  WBC 12.3* 11.5* --  NEUTROABS -- -- 9.1*  HGB 10.9*  11.9* --  HCT 31.8* 33.7* --  MCV 89.6 89.4 --  PLT 229 267 --   Urinalysis:  Results for Vanessa [name] (MRN [id number]) as of 01/28/2012 15:01  Ref. Range 01/26/2012 20:39  Color, Urine Latest Range: YELLOW  AMBER (A)  APPearance Latest Range: CLEAR  CLOUDY (A)  Specific Gravity, Urine Latest Range: 1.005-1.030  1.029  pH Latest Range: 4.6-8.0  7.5  Glucose, UA Latest Range: NEGATIVE mg/dL NEGATIVE  Bilirubin Urine Latest Range: NEGATIVE  SMALL (A)  Ketones, ur Latest Range: NEGATIVE mg/dL 15 (A)  Protein Latest Range: NEGATIVE mg/dL 30 (A)  Urobilinogen, UA Latest Range: 0.0-1.0 mg/dL 2.0 (H)  Nitrite Latest Range: NEGATIVE  NEGATIVE  Leukocytes, UA Latest Range: NEGATIVE  LARGE (A)  Hgb urine dipstick Latest Range: NEGATIVE  NEGATIVE  Urine-Other No range found MUCOUS PRESENT  WBC, UA Latest Range: <3 WBC/hpf 21-50  Squamous Epithelial / LPF Latest Range: RARE  FEW (A)  Bacteria, UA Latest Range: RARE  FEW (A)    Micro Results: Recent Results (from the past 240 hour(s))  URINE CULTURE     Status: Normal   Collection Time   01/26/12  8:39 PM      Component Value Range Status Comment   Specimen Description URINE, RANDOM   Final    Special Requests NONE  Final    Culture  Setup Time [id number]   Final    Colony Count 50,000 COLONIES/ML   Final    Culture     Final    Value: Multiple bacterial morphotypes present, none predominant. Suggest appropriate recollection if clinically indicated.   Report Status 01/28/2012 FINAL   Final     Studies/Results: Scheduled Meds:   . sodium chloride   Intravenous STAT  . antiseptic oral rinse  15 mL Mouth Rinse q12n4p  . cefTRIAXone (ROCEPHIN)  IV  1 g Intravenous Once  . cefTRIAXone (ROCEPHIN)  IV  1 g Intravenous Q24H  . chlorhexidine  15 mL Mouth Rinse BID  .  HYDROmorphone (DILAUDID) injection  1 mg Intravenous Once  . morphine  15 mg Oral Daily  . morphine  30 mg Oral QHS  . ondansetron  4 mg Intravenous Once  . potassium  chloride  10 mEq Intravenous Q1 Hr x 6  . sodium chloride  1,000 mL Intravenous Once  . DISCONTD: morphine  15 mg Oral q morning - 10a  . DISCONTD: morphine  30 mg Oral QHS  . DISCONTD:  morphine injection  4 mg Intravenous Once   Continuous Infusions:   . sodium chloride 75 mL/hr at 01/28/12 1153  . DISCONTD: dextrose 5 % and 0.9% NaCl 150 mL/hr at 01/28/12 1059   PRN Meds:.HYDROmorphone (DILAUDID) injection, ondansetron (ZOFRAN) IV, ondansetron (ZOFRAN) IV, ondansetron, DISCONTD:  HYDROmorphone (DILAUDID) injection, DISCONTD:  HYDROmorphone (DILAUDID) injection  Anti-infectives:  Anti-infectives     Start     Dose/Rate Route Frequency Ordered Stop   01/27/12 2200   cefTRIAXone (ROCEPHIN) 1 g in dextrose 5 % 50 mL IVPB        1 g 100 mL/hr over 30 Minutes Intravenous Every 24 hours 01/27/12 2152     01/27/12 1915   cefTRIAXone (ROCEPHIN) 1 g in dextrose 5 % 50 mL IVPB        1 g 100 mL/hr over 30 Minutes Intravenous  Once 01/27/12 1914 01/27/12 1957          Assessment/Plan: Principal Problem:  *Nausea & vomiting Active Problems:  Pyelonephritis  Leucocytosis  Hypercalcemia  1.  Nausea and vomiting - appears improved today.  Possibly related to UTI.  Treated symptomatically.  Advancing diet.  2.  LLQ Pain - DDX includes Pyelonephritis, radiation proctitis, recurrence of cervical cancer (hopefully unlikely), severe constipation from high dose narcotics.  Will further evaluate with CT abdomen / pelvis.  3.  Hypercalcemia - appears to have normalized with IVF.  4.  Rectal Bleeding - with drop in HGb (probably at least partially from hemodilution with IVF).  Will evaluate with CT.  Pending results may call GI in the morning. 5/8.  Will add miralax.  5.  Hypokalemia - will replete and check bmet in am.  6.  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  Restarted home medications.  7.    DVT Prophylaxis - SCDs   LOS: 1 day   Stephani Police 01/28/2012,  3:10 PM [phone]

## 2012-01-28 NOTE — H&P (Signed)
HUNTLEIGH DOOLEN is an 36 y.o. female.   Chief Complaint: nausea vomiting HPI: This is a 36 year old female with multiple medical problems who has been in and out of the hospital recently with issues related to her previous cervical cancer. Patient was seen in the emergency room on May 5 with abdominal pain nausea vomiting. She was diagnosed as UTI and sent home on medication. She came back today with persistent nausea vomiting as well as lower abdominal pain. She has apparently not been able to take her medications and keep them down. Also not able to eat or drink since she left the emergency room. She's also had some mild fever and chills at home but here the temperature is normal. She has hadt 5 episodes of nausea vomiting today. Also some dysuria associated with her symptoms.  Past Medical History  Diagnosis Date  . Hypertension   . Cervical cancer     Past Surgical History  Procedure Date  . Cholecystectomy     History reviewed. No pertinent family history. Social History:  reports that she has been smoking.  She does not have any smokeless tobacco history on file. She reports that she does not drink alcohol or use illicit drugs.  Allergies:  Allergies  Allergen Reactions  . Morphine And Related Hives    Medications Prior to Admission  Medication Sig Dispense Refill  . cephALEXin (KEFLEX) 500 MG capsule Take 500 mg by mouth 4 (four) times daily. Started today and ends on the 16th.      . hydrochlorothiazide (HYDRODIURIL) 12.5 MG tablet Take 12.5 mg by mouth daily.      Marland Kitchen ibuprofen (ADVIL,MOTRIN) 800 MG tablet Take 800 mg by mouth 3 (three) times daily.      Marland Kitchen morphine (MSIR) 15 MG tablet Take 15-30 mg by mouth 2 (two) times daily. 15 mg every morning and 30 mg at night      . ondansetron (ZOFRAN) 4 MG tablet Take 4 mg by mouth every 6 (six) hours as needed. For nausea.      Marland Kitchen oxyCODONE-acetaminophen (PERCOCET) 5-325 MG per tablet Take 1-2 tablets by mouth every 6 (six) hours as  needed. For pain.        Results for orders placed during the hospital encounter of 01/27/12 (from the past 48 hour(s))  CBC     Status: Abnormal   Collection Time   01/27/12  6:37 PM      Component Value Range Comment   WBC 11.5 (*) 4.0 - 10.5 (K/uL)    RBC 3.77 (*) 3.87 - 5.11 (MIL/uL)    Hemoglobin 11.9 (*) 12.0 - 15.0 (g/dL)    HCT 16.1 (*) 09.6 - 46.0 (%)    MCV 89.4  78.0 - 100.0 (fL)    MCH 31.6  26.0 - 34.0 (pg)    MCHC 35.3  30.0 - 36.0 (g/dL)    RDW 04.5  40.9 - 81.1 (%)    Platelets 267  150 - 400 (K/uL)   BASIC METABOLIC PANEL     Status: Abnormal   Collection Time   01/27/12  6:37 PM      Component Value Range Comment   Sodium 135  135 - 145 (mEq/L)    Potassium 4.0  3.5 - 5.1 (mEq/L) HEMOLYSIS AT THIS LEVEL MAY AFFECT RESULT   Chloride 101  96 - 112 (mEq/L)    CO2 20  19 - 32 (mEq/L)    Glucose, Bld 104 (*) 70 - 99 (mg/dL)  BUN 3 (*) 6 - 23 (mg/dL)    Creatinine, Ser 1.47  0.50 - 1.10 (mg/dL)    Calcium 82.9 (*) 8.4 - 10.5 (mg/dL)    GFR calc non Af Amer >90  >90 (mL/min)    GFR calc Af Amer >90  >90 (mL/min)    No results found.  Review of Systems  Constitutional: Positive for fever and chills.  HENT: Negative.   Eyes: Negative.   Respiratory: Negative.   Cardiovascular: Negative.   Gastrointestinal: Positive for nausea, vomiting, abdominal pain and diarrhea. Negative for heartburn, constipation, blood in stool and melena.  Genitourinary: Positive for dysuria and urgency.  Musculoskeletal: Negative.   Skin: Negative.   Neurological: Negative.   Endo/Heme/Allergies: Negative.   Psychiatric/Behavioral: Negative.     Blood pressure 158/82, pulse 52, temperature 98.5 F (36.9 C), temperature source Oral, resp. rate 19, height 5\' 4"  (1.626 m), weight 72 kg (158 lb 11.7 oz), SpO2 98.00%. Physical Exam  Constitutional: She is oriented to person, place, and time. She appears well-developed and well-nourished. She appears distressed.  HENT:  Head:  Normocephalic and atraumatic.  Right Ear: External ear normal.  Left Ear: External ear normal.  Nose: Nose normal.  Mouth/Throat: Oropharynx is clear and moist.  Eyes: Conjunctivae and EOM are normal. Pupils are equal, round, and reactive to light.  Neck: Normal range of motion. Neck supple.  Cardiovascular: Normal rate, regular rhythm, normal heart sounds and intact distal pulses.   Respiratory: Effort normal and breath sounds normal.  GI: She exhibits no mass. There is tenderness. There is no rebound and no guarding.  Musculoskeletal: Normal range of motion.  Neurological: She is alert and oriented to person, place, and time. She has normal reflexes.  Skin: Skin is warm and dry.  Psychiatric: She has a normal mood and affect. Her behavior is normal. Judgment and thought content normal.     Assessment/Plan Assessment this is a 36 year old female being admitted with intractable nausea and vomiting possibly secondary to acute pyelonephritis. Also has hypercalcemia. Plan #1 nausea and vomiting: We will treat this symptomatically but also hot to touch her UTI pyelo-to see if this will control her symptoms. We will hydrate her accordingly Plan #2 pyelonephritis: We will obtain urine and blood cultures and start empiric Rocephin #3 leukocytosis: Most likely from her UTI. #4 hypercalcemia: Cause not entirely clear but patient has history of cervical cancer.. we will follow the calcium level and it is still high we will check parathyroid hormone level and also possibly her bone scan #5 history of cervical cancer: Patient has had chemotherapy and radiation apparently she is stable  Brionna Romanek,LAWAL 01/28/2012, 2:07 AM

## 2012-01-28 NOTE — Care Management Note (Signed)
Page 2 of 2   03/10/2012     10:06:07 AM   CARE MANAGEMENT NOTE 03/10/2012  Patient:  Vanessa [name], Vanessa [name]   Account Number:  [id number]  Date Initiated:  01/28/2012  Documentation initiated by:  Donn Pierini  Subjective/Objective Assessment:   Pt admitted with N/V- UTI-pyelonephritis     Action/Plan:   PTA pt lived at home - independent with ADLs-   Anticipated DC Date:  03/13/2012   Anticipated DC Plan:  HOME W HOME HEALTH SERVICES      DC Planning Services  CM consult      Choice offered to / List presented to:  C-1 Patient        HH arranged  HH-1 RN  HH-2 PT  HH-4 NURSE'S AIDE      HH agency  [facility]   Status of service:  Completed, signed off Medicare Important Message given?   (If response is "NO", the following Medicare IM given date fields will be blank) Date Medicare IM given:   Date Additional Medicare IM given:    Discharge Disposition:  HOME W HOME HEALTH SERVICES  Per UR Regulation:  Reviewed for med. necessity/level of care/duration of stay  If discussed at Long Length of Stay Meetings, dates discussed:   02/26/2012    Comments:  PCP- [place]ue[place]  03/06/12- 1130- Donn Pierini RN, BSN [phone] Spoke with pt at bedside regarding possible d/c needs. Pt states that she plans to return home with her Aunt and Dad helping her. When talking about going home with the drains and TNA pt is unsure about whether they can handle that at home- pt is open to ST-rehab prior to returning home if needed but wants to return home if able. Would need HH services if she returns home.  03-03-12 12:45pm Avie Arenas, RNBSN [phone] Talked with patient about discharge planning.  On discharge will have Aunt and her Dad taking care of her.  Children are being cared for by her mother and brother.  Patient feels Aunt and Dad will be able to care for TNA and drains at home if needed.  02-20-12 9am Avie Arenas, RNBSN [phone] Patient tx to ICU - intermediate  overflow - for continued bleeding, thombocytopenia, for closer observation.  02-19-12 Patient having stool looking drainage from drains , Surgery ordering CT scan of abdomen . HGB  7.3*  , WBC 17.3*  , rectal bleeding this am .   Referral for [facility] . Spoke with Barnetta Chapel PA, patient may go back to operating room , if she does she will have open wound. Spoke with patient discussed home health , left list of home health agencies with patient . Will continue to follow. Ronny Flurry RN BSN [phone]    02/14/12 12:51 Letha Cape RN, BSN [phone] patient transferred to surgical floor today.  02/13/12 12:18 Letha Cape RN, BSN [phone] patient is getting percutaneous drain by IR today for abd abscess.  Patient not able to tolerate solids, still on liquid diet.  01/31/12 12:28 Letha Cape RN, BSN [phone] patient states her aunt takes her to all her Doctor's appts, she does not need ast with transportation, information given to CSW.  NCM will continue to follow for dc needs.  01/28/12- 1615- Donn Pierini RN, BSN [phone] Spoke with pt at bedside- per conversation pt lives at home with children (ranging in ages 27-18 yrs- pt has 8 total)- pt states she will have transportation home and she  has medication benefits with Medicaid. NCM to follow for d/c planning- no anticipated d/c needs.

## 2012-01-28 NOTE — ED Provider Notes (Signed)
Medical screening examination/treatment/procedure(s) were performed by non-physician practitioner and as supervising physician I was immediately available for consultation/collaboration.   Carleene Cooper III, MD 01/28/12 [phone]

## 2012-01-29 DIAGNOSIS — N12 Tubulo-interstitial nephritis, not specified as acute or chronic: Secondary | ICD-10-CM

## 2012-01-29 DIAGNOSIS — K625 Hemorrhage of anus and rectum: Secondary | ICD-10-CM

## 2012-01-29 DIAGNOSIS — K5909 Other constipation: Secondary | ICD-10-CM

## 2012-01-29 DIAGNOSIS — R112 Nausea with vomiting, unspecified: Secondary | ICD-10-CM

## 2012-01-29 DIAGNOSIS — K520 Gastroenteritis and colitis due to radiation: Principal | ICD-10-CM

## 2012-01-29 DIAGNOSIS — R933 Abnormal findings on diagnostic imaging of other parts of digestive tract: Secondary | ICD-10-CM

## 2012-01-29 DIAGNOSIS — K56609 Unspecified intestinal obstruction, unspecified as to partial versus complete obstruction: Secondary | ICD-10-CM

## 2012-01-29 DIAGNOSIS — R1084 Generalized abdominal pain: Secondary | ICD-10-CM

## 2012-01-29 LAB — BASIC METABOLIC PANEL
BUN: 3 mg/dL — ABNORMAL LOW (ref 6–23)
Creatinine, Ser: 0.52 mg/dL (ref 0.50–1.10)
GFR calc Af Amer: >90 mL/min (ref >90)
GFR calc non Af Amer: >90 mL/min (ref >90)
Glucose, Bld: 115 mg/dL — ABNORMAL HIGH (ref 70–99)

## 2012-01-29 LAB — CBC
HCT: 31.7 % — ABNORMAL LOW (ref 36.0–46.0)
MCHC: 34.1 g/dL (ref 30.0–36.0)
MCV: 89.3 fL (ref 78.0–100.0)
RDW: 13.4 % (ref 11.5–15.5)

## 2012-01-29 LAB — URINE CULTURE: Culture  Setup Time: 201305070352

## 2012-01-29 MED ORDER — POTASSIUM CHLORIDE 10 MEQ/100ML IV SOLN
10.0000 meq | INTRAVENOUS | Status: AC
  Administered 2012-01-29 (×6): 10 meq via INTRAVENOUS
  Filled 2012-01-29 (×6): qty 100

## 2012-01-29 MED ORDER — POTASSIUM CHLORIDE CRYS ER 20 MEQ PO TBCR
20.0000 meq | EXTENDED_RELEASE_TABLET | Freq: Two times a day (BID) | ORAL | Status: AC
  Administered 2012-01-29 – 2012-01-30 (×4): 20 meq via ORAL
  Filled 2012-01-29 (×6): qty 1

## 2012-01-29 MED ORDER — SODIUM CHLORIDE 0.9 % IV SOLN
INTRAVENOUS | Status: DC
  Administered 2012-01-29 – 2012-01-30 (×2): via INTRAVENOUS
  Filled 2012-01-29 (×5): qty 1000

## 2012-01-29 MED ORDER — PEG-KCL-NACL-NASULF-NA ASC-C 100 G PO SOLR
1.0000 | Freq: Once | ORAL | Status: AC
  Administered 2012-01-29: 100 g via ORAL
  Filled 2012-01-29: qty 1

## 2012-01-29 MED ORDER — NALOXONE HCL 1 MG/ML IJ SOLN
1.0000 mg | INTRAMUSCULAR | Status: DC
  Filled 2012-01-29: qty 2

## 2012-01-29 MED ORDER — NALOXONE HCL 0.4 MG/ML IJ SOLN
INTRAMUSCULAR | Status: AC
  Filled 2012-01-29: qty 1

## 2012-01-29 MED ORDER — POLYETHYLENE GLYCOL 3350 17 GM/SCOOP PO POWD
1.0000 | Freq: Once | ORAL | Status: AC
  Administered 2012-01-29: 1 via ORAL
  Filled 2012-01-29: qty 255

## 2012-01-29 NOTE — Consult Note (Signed)
Patient seen, examined, and I agree with the above documentation, including the assessment and plan. Plan for colonoscopy tomorrow. The nature of the procedure, as well as the risks, benefits, and alternatives were carefully and thoroughly reviewed with the patient. Ample time for discussion and questions allowed. The patient understood, was satisfied, and agreed to proceed.  Further plans after procedure.

## 2012-01-29 NOTE — ED Provider Notes (Signed)
Medical screening examination/treatment/procedure(s) were performed by non-physician practitioner and as supervising physician I was immediately available for consultation/collaboration.  Juliet Rude. Rubin Payor, MD 01/29/12 [phone]

## 2012-01-29 NOTE — Progress Notes (Signed)
Patient ID: NICKCOLE BRALLEY, female   DOB: 1975/11/18, 36 y.o.   MRN: [id number]   PATIENT DETAILS Name: Vanessa [name] Age: 36 y.o. Sex: female Date of Birth: 19-Mar-1976 Admit Date: 01/27/2012 WGN:[id number],MVHQI A, MD, MD OB/GYN:  Dr. Nelly Rout   Consults:  [facility]  Interim History: This is a 36 year old female with history of cervical cancer status post chemotherapy and radiation She was seen in the emergency room on May 5 with abdominal pain nausea vomiting. She was diagnosed as UTI and sent home on medication. She came back with persistent nausea vomiting as well as lower abdominal pain.  She reports intermittent BRBPR over the past couple of months.  CT Abdomen Pelvis shows severe constipation and colitis.  Consideration is given to radiation colitis and GI has been consulted.    Subjective: Still having abdominal pain, but feels better than yesterday.  Objective: Weight change:   Intake/Output Summary (Last 24 hours) at 01/29/12 1249 Last data filed at 01/29/12 0608  Gross per 24 hour  Intake 1134.25 ml  Output   1300 ml  Net -165.75 ml   Blood pressure 144/84, pulse 66, temperature 98.4 F (36.9 C), temperature source Oral, resp. rate 16, height 5\' 4"  (1.626 m), weight 72 kg (158 lb 11.7 oz), SpO2 98.00%. Filed Vitals:   01/28/12 0613 01/28/12 1400 01/28/12 2130 01/29/12 0437  BP: 130/77 153/91 153/94 144/84  Pulse:  54 64 66  Temp:  98.5 F (36.9 C) 98.6 F (37 C) 98.4 F (36.9 C)  TempSrc:  Oral Oral Oral  Resp:  18 20 16   Height:      Weight:      SpO2:  100% 98% 98%    Physical Exam: General: No acute distress, patient diaphoretic (reports she has a hot flash) Lungs: Clear to auscultation bilaterally without wheezes or crackles Cardiovascular: Regular rate and rhythm without murmur gallop or rub normal S1 and S2 Abdomen: exquisite tenderness in LLQ, nondistended, soft, bowel sounds positive, no rebound, no ascites, no appreciable  mass Extremities: No significant cyanosis, clubbing, or edema bilateral lower extremities  Basic Metabolic Panel:  Lab 01/29/12 6962 01/28/12 0607  NA 134* 135  K 2.9* 3.0*  CL 101 103  CO2 24 23  GLUCOSE 115* 117*  BUN 3* <3*  CREATININE 0.52 0.44*  CALCIUM 10.4 10.0  MG 1.6 --  PHOS -- --   CBC:  Lab 01/29/12 0615 01/28/12 0607 01/26/12 1940  WBC 10.9* 12.3* --  NEUTROABS -- -- 9.1*  HGB 10.8* 10.9* --  HCT 31.7* 31.8* --  MCV 89.3 89.6 --  PLT 224 229 --   Urinalysis:  Results for Vanessa, [name] (MRN [id number]) as of 01/28/2012 15:01  Ref. Range 01/26/2012 20:39  Color, Urine Latest Range: YELLOW  AMBER (A)  APPearance Latest Range: CLEAR  CLOUDY (A)  Specific Gravity, Urine Latest Range: 1.005-1.030  1.029  pH Latest Range: 4.6-8.0  7.5  Glucose, UA Latest Range: NEGATIVE mg/dL NEGATIVE  Bilirubin Urine Latest Range: NEGATIVE  SMALL (A)  Ketones, ur Latest Range: NEGATIVE mg/dL 15 (A)  Protein Latest Range: NEGATIVE mg/dL 30 (A)  Urobilinogen, UA Latest Range: 0.0-1.0 mg/dL 2.0 (H)  Nitrite Latest Range: NEGATIVE  NEGATIVE  Leukocytes, UA Latest Range: NEGATIVE  LARGE (A)  Hgb urine dipstick Latest Range: NEGATIVE  NEGATIVE  Urine-Other No range found MUCOUS PRESENT  WBC, UA Latest Range: <3 WBC/hpf 21-50  Squamous Epithelial / LPF Latest Range: RARE  FEW (A)  Bacteria, UA Latest Range: RARE  FEW (A)    Micro Results: Recent Results (from the past 240 hour(s))  URINE CULTURE     Status: Normal   Collection Time   01/26/12  8:39 PM      Component Value Range Status Comment   Specimen Description URINE, RANDOM   Final    Special Requests NONE   Final    Culture  Setup Time [id number]   Final    Colony Count 50,000 COLONIES/ML   Final    Culture     Final    Value: Multiple bacterial morphotypes present, none predominant. Suggest appropriate recollection if clinically indicated.   Report Status 01/28/2012 FINAL   Final   URINE CULTURE     Status: Normal    Collection Time   01/27/12 11:54 PM      Component Value Range Status Comment   Specimen Description URINE, RANDOM   Final    Special Requests NONE   Final    Culture  Setup Time [id number]   Final    Colony Count NO GROWTH   Final    Culture NO GROWTH   Final    Report Status 01/29/2012 FINAL   Final     Studies/Results: Scheduled Meds:    . antiseptic oral rinse  15 mL Mouth Rinse q12n4p  . cefTRIAXone (ROCEPHIN)  IV  1 g Intravenous Q24H  . chlorhexidine  15 mL Mouth Rinse BID  . iohexol  20 mL Oral Q1 Hr x 2  . morphine  15 mg Oral Daily  . morphine  30 mg Oral QHS  . peg 3350 powder  1 kit Oral Once  . polyethylene glycol powder  1 Container Oral Once  . potassium chloride  10 mEq Intravenous Q1 Hr x 6  . potassium chloride  10 mEq Intravenous Q1 Hr x 6  . potassium chloride  20 mEq Oral BID  . DISCONTD: polyethylene glycol  17 g Oral Daily   Continuous Infusions:    . sodium chloride 0.9 % 1,000 mL with potassium chloride 40 mEq infusion 75 mL/hr at 01/29/12 0930  . DISCONTD: sodium chloride 75 mL/hr at 01/29/12 0217   PRN Meds:.HYDROmorphone (DILAUDID) injection, iohexol, ondansetron (ZOFRAN) IV, ondansetron, DISCONTD:  HYDROmorphone (DILAUDID) injection  Anti-infectives:  Anti-infectives     Start     Dose/Rate Route Frequency Ordered Stop   01/27/12 2200   cefTRIAXone (ROCEPHIN) 1 g in dextrose 5 % 50 mL IVPB        1 g 100 mL/hr over 30 Minutes Intravenous Every 24 hours 01/27/12 2152     01/27/12 1915   cefTRIAXone (ROCEPHIN) 1 g in dextrose 5 % 50 mL IVPB        1 g 100 mL/hr over 30 Minutes Intravenous  Once 01/27/12 1914 01/27/12 1957          Assessment/Plan: Principal Problem:  *Nausea & vomiting Active Problems:  Pyelonephritis  Leucocytosis  Hypercalcemia   1.  Nausea and vomiting - appears resolved.  Possibly related to UTI.  Treated symptomatically.  Advancing diet.  2.  LLQPelvicPain - Constipation and sigmoid colitis on CT.   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  Miralax bowel prep being given.  GI consulted for colonoscopy-scheduled for 5/9  3.  Hypercalcemia - appears to have normalized with IVF.  4.  Rectal Bleeding - with drop in HGb (probably from hemodilution with IVF, I don't believe she's having significant GI blood  loss).  Monitor H/H periodically  5.  Hypokalemia - likely 2/2 to nausea and vomiting, will check magnesium.  Received multiple IV runs 5/7.  will replete again and check bmet in am.  6.  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  Restarted home medications plus 0.5 mg dilaudid for break thru pain.  7.  Urinary tract infection.  U/A appeared positive but culture is negative.  Will stop Rocephin today after 3rd dose.  8. Cervical cancer-status post chemoradiation, when GI workup complete, we'll need referral to GYN oncology, radiation oncology, and general oncology as well.  9.    DVT Prophylaxis - SCDs  10. Disposition-remain inpatient  11. CODE STATUS-full code   LOS: 2 days   Stephani Police 01/29/2012, 12:49 PM [phone]  Attending -I've seen and examined the patient, I agree with the above assessment and plan. She is due for a colonoscopy tomorrow.  Windell Norfolk MD

## 2012-01-29 NOTE — Consult Note (Signed)
Referring Provider: Dr Jerral Ralph- [facility] Primary Care Physician:  Dorrene German, MD, MD Primary Gastroenterologist:  none.  Reason for Consultation:  Pelvic pain and rectal bleeding  HPI: Vanessa [name] is a 36 y.o. female who unfortunately was diagnosed with cervical cancer in 2011. She had an invasive squamous cell carcinoma. She underwent external beam radiotherapy and regular therapy with sensitizing cisplatin and completed her radiation treatments January of 2012. Patient had been seen recently by her GYN oncologist Dr. Nelly Rout in mid April with complaints of severe pelvic pain and rectal bleeding. Apparently she has had some lower abdominal pain for several months. CT scan done in February 2013 showed prominent endometrium, small amount of free pelvic fluid and a few small positive pelvic lymph nodes but no definite evidence of metastatic disease. Patient has been requiring Percocet and over the past month- MS Contin for her ongoing abdominal pain. She was to be referred to GI for colonoscopy but has not had that referral as yet. Dr. Nelly Rout recommended options of radical hysterectomy and bilateral pelvic periaortic lymph node dissection possible radiation therapy with sensitizing cisplatin. Was also recommended that she have a PET scan. She has not had any further workup since that time and presented to the hospital on 5/7 with complaints of severe left lower quadrant pain and associated nausea and vomiting. She was admitted and underwent repeat CT scan on 01/28/2012 it shows a 10-15 cm segment of sigmoid colon with circumferential irregular wall thickening with associated edema and inflammation of the sigmoid mesocolon soft tissue of the pelvic floor. She was also noted to have a somewhat distended cecum and ascending colon with  large volume of stool, the left colon was decompressed and there is prominent soft tissue attenuation at the area of transition from the right colon. Cannot rule out an  obstructing mass.  Patient relates that her pain is constant sharp and has been progressive over the past month on the left side she has also been less tender on the right side but uncomfortable she is no longer having any vomiting. She has noted intermittent bright red blood in her bowels over the past month. She denies any vaginal bleeding. She has not had any prior GI evaluation. Hemoglobin was 11.9 on admission, is 10.9 now. She has also been hypokalemic and is receiving replacement.   Past Medical History  Diagnosis Date  . Hypertension   . Cervical cancer     Past Surgical History  Procedure Date  . Cholecystectomy     Prior to Admission medications   Medication Sig Start Date End Date Taking? Authorizing Provider  cephALEXin (KEFLEX) 500 MG capsule Take 500 mg by mouth 4 (four) times daily. Started today and ends on the 16th. 01/27/12 02/06/12 Yes Brigitte Cyndie Chime, PA-C  hydrochlorothiazide (HYDRODIURIL) 12.5 MG tablet Take 12.5 mg by mouth daily.   Yes Historical Provider, MD  ibuprofen (ADVIL,MOTRIN) 800 MG tablet Take 800 mg by mouth 3 (three) times daily. 01/27/12 02/06/12 Yes Brigitte Cyndie Chime, PA-C  morphine (MS CONTIN) 15 MG 12 hr tablet Take 15-30 mg by mouth 2 (two) times daily. 15 mg every morning and 30 mg at night   Yes Historical Provider, MD  ondansetron (ZOFRAN) 4 MG tablet Take 4 mg by mouth every 6 (six) hours as needed. For nausea. 01/27/12 02/03/12 Yes Stephanie Justice Wolfe, PA-C  oxyCODONE-acetaminophen (PERCOCET) 5-325 MG per tablet Take 1-2 tablets by mouth every 6 (six) hours as needed. For pain. 01/27/12 02/06/12 Yes Shaaron Adler, PA-C  Current Facility-Administered Medications  Medication Dose Route Frequency Provider Last Rate Last Dose  . antiseptic oral rinse (BIOTENE) solution 15 mL  15 mL Mouth Rinse q12n4p Kathlen Mody, MD   15 mL at 01/28/12 1600  . cefTRIAXone (ROCEPHIN) 1 g in dextrose 5 % 50 mL IVPB  1 g Intravenous Q24H Rometta Emery, MD   1  g at 01/28/12 2134  . chlorhexidine (PERIDEX) 0.12 % solution 15 mL  15 mL Mouth Rinse BID Kathlen Mody, MD   15 mL at 01/29/12 0830  . HYDROmorphone (DILAUDID) injection 0.5 mg  0.5 mg Intravenous Q3H PRN Caroline More, NP   0.5 mg at 01/29/12 1610  . iohexol (OMNIPAQUE) 300 MG/ML solution 100 mL  100 mL Intravenous Once PRN Medication Radiologist, MD   100 mL at 01/28/12 1837  . iohexol (OMNIPAQUE) 300 MG/ML solution 20 mL  20 mL Oral Q1 Hr x 2 Medication Radiologist, MD   20 mL at 01/28/12 1700  . morphine (MS CONTIN) 12 hr tablet 15 mg  15 mg Oral Daily Clydia Llano, MD   15 mg at 01/29/12 1034  . morphine (MS CONTIN) 12 hr tablet 30 mg  30 mg Oral QHS Clydia Llano, MD   30 mg at 01/28/12 2134  . ondansetron (ZOFRAN) tablet 4 mg  4 mg Oral Q6H PRN Rometta Emery, MD       Or  . ondansetron (ZOFRAN) injection 4 mg  4 mg Intravenous Q6H PRN Rometta Emery, MD      . polyethylene glycol (MIRALAX / GLYCOLAX) packet 17 g  17 g Oral Daily Stephani Police, PA   17 g at 01/29/12 1007  . polyethylene glycol powder (GLYCOLAX/MIRALAX) container 1 Container  1 Container Oral Once Stephani Police, [place]   1 Container at 01/29/12 1055  . potassium chloride 10 mEq in 100 mL IVPB  10 mEq Intravenous Q1 Hr x 6 Stephani Police, PA   10 mEq at 01/28/12 1540  . potassium chloride 10 mEq in 100 mL IVPB  10 mEq Intravenous Q1 Hr x 6 Stephani Police, PA   10 mEq at 01/29/12 0940  . potassium chloride SA (K-DUR,KLOR-CON) CR tablet 20 mEq  20 mEq Oral BID Stephani Police, PA      . sodium chloride 0.9 % 1,000 mL with potassium chloride 40 mEq infusion   Intravenous Continuous Stephani Police, PA 75 mL/hr at 01/29/12 0930    . DISCONTD: 0.9 %  sodium chloride infusion   Intravenous Continuous Stephani Police, PA 75 mL/hr at 01/29/12 0217    . DISCONTD: HYDROmorphone (DILAUDID) injection 0.5 mg  0.5 mg Intravenous Q4H PRN Stephani Police, PA   0.5 mg at 01/28/12 1640  . DISCONTD: morphine (MSIR) tablet 15 mg  15 mg Oral  q morning - 10a Stephani Police, PA      . DISCONTD: morphine (MSIR) tablet 30 mg  30 mg Oral QHS Stephani Police, PA        Allergies as of 01/27/2012 - Review Complete 01/27/2012  Allergen Reaction Noted  . Morphine and related Hives 11/13/2011    History reviewed. No pertinent family history.  History   Social History  . Marital Status: Single    Spouse Name: N/A    Number of Children: N/A  . Years of Education: N/A   Occupational History  . Not on file.   Social History Main Topics  . Smoking status:  Current Everyday Smoker -- 0.5 packs/day  . Smokeless tobacco: Not on file  . Alcohol Use: No  . Drug Use: No     pt states she smokes 4-7 blunts(cigars) daily due to pain  . Sexually Active: No   Other Topics Concern  . Not on file   Social History Narrative  . No narrative on file    Review of Systems: Pertinent positive and negative review of systems were noted in the above HPI section.  All other review of systems was otherwise negative. Physical Exam: Vital signs in last 24 hours: Temp:  [98.4 F (36.9 C)-98.6 F (37 C)] 98.4 F (36.9 C) (05/08 0437) Pulse Rate:  [54-66] 66  (05/08 0437) Resp:  [16-20] 16  (05/08 0437) BP: (144-153)/(84-94) 144/84 mmHg (05/08 0437) SpO2:  [98 %-100 %] 98 % (05/08 0437) Last BM Date: 01/26/12 General:   Alert,  Well-developed, well-nourished, pleasant and cooperative in NAD Head:  Normocephalic and atraumatic. Eyes:  Sclera clear, no icterus.   Conjunctiva pink. Ears:  Normal auditory acuity. Nose:  No deformity, discharge,  or lesions. Mouth:  No deformity or lesions.   Neck:  Supple; no masses or thyromegaly. Lungs:  Clear throughout to auscultation.   No wheezes, crackles, or rhonchi. Heart:  Regular rate and rhythm; no murmurs, clicks, rubs,  or gallops. Abdomen:  Soft,Bs+, quite tender in LLQ and less so in RLQ, no palp mass or hsm Rectal ; not done, Msk:  Symmetrical without gross deformities. . Pulses:  Normal  pulses noted. Extremities:  Without clubbing or edema. Neurologic:  Alert and  oriented x4;  grossly normal neurologically. Skin:  Intact without significant lesions or rashes.. Psych:  Alert and cooperative. Normal mood and affect.  Intake/Output from previous day: 05/07 0701 - 05/08 0700 In: 1134.3 [P.O.:240; I.V.:844.3; IV Piggyback:50] Out: 1300 [Urine:1300] Intake/Output this shift:    Lab Results:  Basename 01/29/12 0615 01/28/12 0607 01/27/12 1837  WBC 10.9* 12.3* 11.5*  HGB 10.8* 10.9* 11.9*  HCT 31.7* 31.8* 33.7*  PLT 224 229 267   BMET  Basename 01/29/12 0615 01/28/12 0607 01/27/12 1837  NA 134* 135 135  K 2.9* 3.0* 4.0  CL 101 103 101  CO2 24 23 20   GLUCOSE 115* 117* 104*  BUN 3* <3* 3*  CREATININE 0.52 0.44* 0.54  CALCIUM 10.4 10.0 10.7*       Studies/Results: Ct Abdomen Pelvis W Contrast  01/28/2012  *RADIOLOGY REPORT*  Clinical Data: Left lower quadrant pain with fever.  Nausea vomiting.  CT ABDOMEN AND PELVIS WITH CONTRAST  Technique:  Multidetector CT imaging of the abdomen and pelvis was performed following the standard protocol during bolus administration of intravenous contrast.  Contrast: OMNIPAQUE IOHEXOL 300 MG/ML  SOLN   Comparison: 11/14/2011  Findings: No focal abnormalities seen in the liver or spleen.  The stomach, duodenum, pancreas, and adrenal glands are unremarkable. Gallbladder is surgically absent.  Kidneys have normal imaging features.  No abdominal aortic aneurysm.  There is no free fluid or lymphadenopathy in the abdomen.  Imaging through the pelvis demonstrates a 10-15 cm segment of sigmoid colon shows circumferential irregular wall thickening. There is edema/inflammation in the sigmoid mesocolon and also involving the soft tissues of the pelvic floor.  The cecal tip is in the lower midline anterior pelvis.  The cecum and ascending colon contain a large volume of stool.  There is no stool visualized in the transverse colon although gas  is seen in the transverse colon.  The left colon is decompressed.  There is a prominent area of soft tissue attenuation of transition from the dilated stool filled right colon to the gas filled, but none stool containing, transverse colon.  Obstructing mass lesion in the ascending colon distally is a consideration.  Bone windows reveal no worrisome lytic or sclerotic osseous lesions.  IMPRESSION:  10-15 cm segment of abnormal sigmoid colon in the lower central pelvis.  This segment shows irregular sequential wall thickening and adjacent edema/inflammation.  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  The cecum and proximal/mid ascending colon is dilated and completely stool-filled.  The stool tracks up to a soft tissue focus in the colon and there is no stool visualized in the transverse colon beyond this area of soft tissue attenuation.  Some type of an obstructing lesion in the colon at this location is suspected.  Colonoscopy may prove helpful to further evaluate.  I discussed these findings by telephone with the hospitalist mid- level Maren Reamer) at [place] hours on 01/28/2012.  Original Report Authenticated By: ERIC A. MANSELL, M.D.    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 While she could have a radiation-induced colitis, with her degree of pain am concerned that she has progression of her disease and involvement/invasion of her colon. #2 normocytic anemia #3 hypertension  PLAN: #1 Will schedule patient for colonoscopy Thursday a.m. with Dr. Rhea Belton. Will order extended bowel prep given her recent constipation and significant right-sided stool. Procedure was discussed in detail with the patient and  she is agreeable to proceed  #2 Further plans pending findings at colonoscopy, she will need further workup which had been initiated by Dr. Nelly Rout, i.e. PET scan and oncology radiation oncology referrals   Kiyona Mcnall [facility]  01/29/2012, 11:30 AM

## 2012-01-30 ENCOUNTER — Encounter (HOSPITAL_COMMUNITY): Admission: EM | Disposition: A | Discharge status: Home Health | Payer: Self-pay | Source: Home / Self Care

## 2012-01-30 ENCOUNTER — Encounter (HOSPITAL_COMMUNITY): Payer: Self-pay | Admitting: *Deleted

## 2012-01-30 ENCOUNTER — Encounter (HOSPITAL_COMMUNITY): Payer: Self-pay | Admitting: Certified Registered"

## 2012-01-30 ENCOUNTER — Inpatient Hospital Stay (HOSPITAL_COMMUNITY): Payer: Medicaid Other | Admitting: Certified Registered"

## 2012-01-30 DIAGNOSIS — N12 Tubulo-interstitial nephritis, not specified as acute or chronic: Secondary | ICD-10-CM

## 2012-01-30 DIAGNOSIS — R1084 Generalized abdominal pain: Secondary | ICD-10-CM

## 2012-01-30 DIAGNOSIS — K520 Gastroenteritis and colitis due to radiation: Secondary | ICD-10-CM | POA: Diagnosis present

## 2012-01-30 DIAGNOSIS — R112 Nausea with vomiting, unspecified: Secondary | ICD-10-CM

## 2012-01-30 HISTORY — PX: COLONOSCOPY: SHX5424

## 2012-01-30 LAB — POCT I-STAT 4, (NA,K, GLUC, HGB,HCT): Hemoglobin: 11.6 g/dL — ABNORMAL LOW (ref 12.0–15.0)

## 2012-01-30 LAB — BASIC METABOLIC PANEL
BUN: 3 mg/dL — ABNORMAL LOW (ref 6–23)
CO2: 24 mEq/L (ref 19–32)
Chloride: 101 mEq/L (ref 96–112)
GFR calc non Af Amer: >90 mL/min (ref >90)
Glucose, Bld: 86 mg/dL (ref 70–99)
Potassium: 3.8 mEq/L (ref 3.5–5.1)

## 2012-01-30 SURGERY — COLONOSCOPY
Anesthesia: General

## 2012-01-30 SURGERY — COLONOSCOPY
Anesthesia: Moderate Sedation

## 2012-01-30 MED ORDER — FENTANYL CITRATE 0.05 MG/ML IJ SOLN: 25.0000 ug | INTRAMUSCULAR | Status: DC | PRN

## 2012-01-30 MED ORDER — SODIUM CHLORIDE 0.9 % IV SOLN
Freq: Once | INTRAVENOUS | Status: AC
  Administered 2012-01-30: 500 mL via INTRAVENOUS

## 2012-01-30 MED ORDER — LACTATED RINGERS IV SOLN
INTRAVENOUS | Status: DC | PRN
  Administered 2012-01-30: 13:00:00 via INTRAVENOUS

## 2012-01-30 MED ORDER — MIDAZOLAM HCL 10 MG/2ML IJ SOLN
INTRAMUSCULAR | Status: AC
  Filled 2012-01-30: qty 4

## 2012-01-30 MED ORDER — DIPHENHYDRAMINE HCL 50 MG/ML IJ SOLN
INTRAMUSCULAR | Status: AC
  Filled 2012-01-30: qty 1

## 2012-01-30 MED ORDER — LACTATED RINGERS IV SOLN
INTRAVENOUS | Status: DC
  Administered 2012-01-30: 13:00:00 via INTRAVENOUS

## 2012-01-30 MED ORDER — FENTANYL NICU IV SYRINGE 50 MCG/ML
INJECTION | INTRAMUSCULAR | Status: DC | PRN
  Administered 2012-01-30 (×5): 25 ug via INTRAVENOUS

## 2012-01-30 MED ORDER — DEXAMETHASONE SODIUM PHOSPHATE 4 MG/ML IJ SOLN
INTRAMUSCULAR | Status: DC | PRN
  Administered 2012-01-30: 4 mg via INTRAVENOUS

## 2012-01-30 MED ORDER — MIDAZOLAM HCL 5 MG/5ML IJ SOLN
INTRAMUSCULAR | Status: DC | PRN
  Administered 2012-01-30 (×2): 2 mg via INTRAVENOUS
  Administered 2012-01-30: 1 mg via INTRAVENOUS
  Administered 2012-01-30: 2 mg via INTRAVENOUS
  Administered 2012-01-30: 1 mg via INTRAVENOUS
  Administered 2012-01-30: 2 mg via INTRAVENOUS

## 2012-01-30 MED ORDER — ONDANSETRON HCL 4 MG/2ML IJ SOLN
INTRAMUSCULAR | Status: DC | PRN
  Administered 2012-01-30: 4 mg via INTRAVENOUS

## 2012-01-30 MED ORDER — FENTANYL CITRATE 0.05 MG/ML IJ SOLN
INTRAMUSCULAR | Status: DC | PRN
  Administered 2012-01-30: 100 ug via INTRAVENOUS

## 2012-01-30 MED ORDER — DIPHENHYDRAMINE HCL 50 MG/ML IJ SOLN
INTRAMUSCULAR | Status: DC | PRN
  Administered 2012-01-30: 25 mg via INTRAVENOUS

## 2012-01-30 MED ORDER — FENTANYL CITRATE 0.05 MG/ML IJ SOLN
INTRAMUSCULAR | Status: AC
  Filled 2012-01-30: qty 4

## 2012-01-30 MED ORDER — PROPOFOL 10 MG/ML IV EMUL
INTRAVENOUS | Status: DC | PRN
  Administered 2012-01-30: 200 mg via INTRAVENOUS

## 2012-01-30 NOTE — Transfer of Care (Signed)
Immediate Anesthesia Transfer of Care Note  Patient: Vanessa [name]  Procedure(s) Performed: Procedure(s) (LRB): COLONOSCOPY (N/A)  Patient Location: PACU  Anesthesia Type: General  Level of Consciousness: awake, alert , oriented and patient cooperative  Airway & Oxygen Therapy: Patient Spontanous Breathing and Patient connected to face mask oxygen  Post-op Assessment: Report given to PACU RN  Post vital signs: Reviewed and stable  Complications: No apparent anesthesia complications

## 2012-01-30 NOTE — Anesthesia Preprocedure Evaluation (Addendum)
Anesthesia Evaluation  Patient identified by MRN, date of birth, ID band Patient awake    Reviewed: Allergy & Precautions, H&P , NPO status , Patient's Chart, lab work & pertinent test results  Airway Mallampati: II TM Distance: >3 FB Neck ROM: Full    Dental No notable dental hx. (+) Teeth Intact and Dental Advisory Given   Pulmonary neg pulmonary ROS,  breath sounds clear to auscultation  Pulmonary exam normal       Cardiovascular hypertension, On Medications Rhythm:Regular Rate:Normal     Neuro/Psych negative neurological ROS  negative psych ROS   GI/Hepatic negative GI ROS, Neg liver ROS,   Endo/Other  negative endocrine ROS  Renal/GU negative Renal ROS  negative genitourinary   Musculoskeletal   Abdominal   Peds  Hematology negative hematology ROS (+)   Anesthesia Other Findings   Reproductive/Obstetrics negative OB ROS                           Anesthesia Physical Anesthesia Plan  ASA: II  Anesthesia Plan: General   Post-op Pain Management:    Induction: Intravenous  Airway Management Planned: LMA  Additional Equipment:   Intra-op Plan:   Post-operative Plan: Extubation in OR  Informed Consent: I have reviewed the patients History and Physical, chart, labs and discussed the procedure including the risks, benefits and alternatives for the proposed anesthesia with the patient or authorized representative who has indicated his/her understanding and acceptance.   Dental advisory given  Plan Discussed with: CRNA  Anesthesia Plan Comments:         Anesthesia Quick Evaluation  

## 2012-01-30 NOTE — Preoperative (Signed)
Beta Blockers   Reason not to administer Beta Blockers:Not Applicable 

## 2012-01-30 NOTE — Discharge Instructions (Addendum)
Home Health will be provided by [facility] [phone]

## 2012-01-30 NOTE — Anesthesia Postprocedure Evaluation (Signed)
  Anesthesia Post-op Note  Patient: Vanessa [name]  Procedure(s) Performed: Procedure(s) (LRB): COLONOSCOPY (N/A)  Patient Location: PACU  Anesthesia Type: General  Level of Consciousness: awake  Airway and Oxygen Therapy: Patient Spontanous Breathing  Post-op Pain: none  Post-op Assessment: Post-op Vital signs reviewed, Patient's Cardiovascular Status Stable, Respiratory Function Stable, Patent Airway and No signs of Nausea or vomiting  Post-op Vital Signs: Reviewed and stable  Complications: No apparent anesthesia complications

## 2012-01-30 NOTE — Progress Notes (Signed)
Patient ID: Vanessa [name], female   DOB: 1976-02-13, 36 y.o.   MRN: [id number]   PATIENT DETAILS Name: Vanessa [name] Age: 36 y.o. Sex: female Date of Birth: 04-Mar-1976 Admit Date: 01/27/2012 NWG:[id number],QMVHQ A, MD, MD OB/GYN:  Dr. Nelly Rout Oncology:   Radiation Oncologist: Dr. Margaretmary Dys  Consults:  [facility]  Interim History: This is a 36 year old female with history of cervical cancer status post chemotherapy and radiation She was seen in the emergency room on May 5 with abdominal pain nausea vomiting. She was diagnosed as UTI and sent home on medication. She came back with persistent nausea vomiting as well as lower abdominal pain.  She reports intermittent BRBPR over the past couple of months.  CT Abdomen Pelvis shows severe constipation and colitis.  [facility] has been consulted and is moving forward with endoscopic evaluation.  Subjective: No complaints, completed bowel prep for colonoscopy.  Objective: Weight change:   Intake/Output Summary (Last 24 hours) at 01/30/12 1339 Last data filed at 01/30/12 1100  Gross per 24 hour  Intake 4620.5 ml  Output    700 ml  Net 3920.5 ml   Blood pressure 145/94, pulse 66, temperature 98.1 F (36.7 C), temperature source Oral, resp. rate 20, height 5\' 4"  (1.626 m), weight 72 kg (158 lb 11.7 oz), SpO2 99.00%. Filed Vitals:   01/30/12 0920 01/30/12 0930 01/30/12 0931 01/30/12 1010  BP: 136/88 134/84 134/84 145/94  Pulse:    66  Temp:    98.1 F (36.7 C)  TempSrc:    Oral  Resp: 81 18 18 20   Height:      Weight:      SpO2: 100% 100% 100% 99%    Physical Exam:   General exam: Awake and alert, speech is clear Chest: Bilaterally clear to auscultation CVS: S1-S2 regular Abdomen: Soft good bowel sounds, pelvic tenderness mild without any rebound Neurological exam: Nonfocal  Basic Metabolic Panel:  Lab 01/30/12 4696 01/30/12 1011 01/29/12 0615  NA 139 138 --  K 3.9 3.8 --  CL -- 101 101  CO2 -- 24  24  GLUCOSE 84 86 --  BUN -- 3* 3*  CREATININE -- 0.54 0.52  CALCIUM -- 10.9* 10.4  MG -- -- 1.6  PHOS -- -- --   CBC:  Lab 01/30/12 1259 01/29/12 0615 01/28/12 0607 01/26/12 1940  WBC -- 10.9* 12.3* --  NEUTROABS -- -- -- 9.1*  HGB 11.6* 10.8* -- --  HCT 34.0* 31.7* -- --  MCV -- 89.3 89.6 --  PLT -- 224 229 --   Urinalysis:  Results for Vanessa [name], Vanessa [name] (MRN [id number]) as of 01/28/2012 15:01  Ref. Range 01/26/2012 20:39  Color, Urine Latest Range: YELLOW  AMBER (A)  APPearance Latest Range: CLEAR  CLOUDY (A)  Specific Gravity, Urine Latest Range: 1.005-1.030  1.029  pH Latest Range: 4.6-8.0  7.5  Glucose, UA Latest Range: NEGATIVE mg/dL NEGATIVE  Bilirubin Urine Latest Range: NEGATIVE  SMALL (A)  Ketones, ur Latest Range: NEGATIVE mg/dL 15 (A)  Protein Latest Range: NEGATIVE mg/dL 30 (A)  Urobilinogen, UA Latest Range: 0.0-1.0 mg/dL 2.0 (H)  Nitrite Latest Range: NEGATIVE  NEGATIVE  Leukocytes, UA Latest Range: NEGATIVE  LARGE (A)  Hgb urine dipstick Latest Range: NEGATIVE  NEGATIVE  Urine-Other No range found MUCOUS PRESENT  WBC, UA Latest Range: <3 WBC/hpf 21-50  Squamous Epithelial / LPF Latest Range: RARE  FEW (A)  Bacteria, UA Latest Range: RARE  FEW (A)  Micro Results: Recent Results (from the past 240 hour(s))  URINE CULTURE     Status: Normal   Collection Time   01/26/12  8:39 PM      Component Value Range Status Comment   Specimen Description URINE, RANDOM   Final    Special Requests NONE   Final    Culture  Setup Time [id number]   Final    Colony Count 50,000 COLONIES/ML   Final    Culture     Final    Value: Multiple bacterial morphotypes present, none predominant. Suggest appropriate recollection if clinically indicated.   Report Status 01/28/2012 FINAL   Final   URINE CULTURE     Status: Normal   Collection Time   01/27/12 11:54 PM      Component Value Range Status Comment   Specimen Description URINE, RANDOM   Final    Special Requests NONE    Final    Culture  Setup Time [id number]   Final    Colony Count NO GROWTH   Final    Culture NO GROWTH   Final    Report Status 01/29/2012 FINAL   Final     Studies/Results: Scheduled Meds:    . sodium chloride   Intravenous Once  . antiseptic oral rinse  15 mL Mouth Rinse q12n4p  . cefTRIAXone (ROCEPHIN)  IV  1 g Intravenous Q24H  . chlorhexidine  15 mL Mouth Rinse BID  . morphine  15 mg Oral Daily  . morphine  30 mg Oral QHS  . naloxone      . peg 3350 powder  1 kit Oral Once  . potassium chloride  10 mEq Intravenous Q1 Hr x 6  . potassium chloride  20 mEq Oral BID  . DISCONTD: naloxone (NARCAN) injection  1 mg Intravenous STAT   Continuous Infusions:    . lactated ringers 20 mL/hr at 01/30/12 1254  . sodium chloride 0.9 % 1,000 mL with potassium chloride 40 mEq infusion 75 mL/hr at 01/29/12 0930   PRN Meds:.HYDROmorphone (DILAUDID) injection, ondansetron (ZOFRAN) IV, ondansetron, DISCONTD: diphenhydrAMINE, DISCONTD: fentaNYL, DISCONTD: midazolam  Anti-infectives:  Anti-infectives     Start     Dose/Rate Route Frequency Ordered Stop   01/27/12 2200   cefTRIAXone (ROCEPHIN) 1 g in dextrose 5 % 50 mL IVPB        1 g 100 mL/hr over 30 Minutes Intravenous Every 24 hours 01/27/12 2152 01/29/12 2359   01/27/12 1915   cefTRIAXone (ROCEPHIN) 1 g in dextrose 5 % 50 mL IVPB        1 g 100 mL/hr over 30 Minutes Intravenous  Once 01/27/12 1914 01/27/12 1957          Assessment/Plan: Principal Problem:  *Nausea & vomiting Active Problems:  Pyelonephritis  Leucocytosis  Hypercalcemia  Abnormal finding on GI tract imaging   1.  Nausea and vomiting - appears resolved.  Possibly related to both severe constipation and UTI.    2.  LLQ PelvicPain - Constipation and sigmoid colitis on CT.  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  Patient under went flex sig and radiation proctitis was noted.  The procedure was interrupted due to the patient's  discomfort and she will be endoscoped under general anesthesia in the OR during the afternoon of 01/30/12.  3.  Hypercalcemia - mild hypercalcemia persists, will get a albumin level with the next of labs. She may need a PTH and vitamin D. levels at some point.  4.  Rectal Bleeding - with drop in HGb (probably from hemodilution with IVF, I don't believe she's having significant GI blood loss).  Monitor H/H periodically.  Hgb is rising (01/30/12) 11.6 and stable.  5.  Hypokalemia - resolved.  likely 2/2 to nausea and vomiting, will check magnesium.  Received multiple IV runs 5/7.  will replete again and check bmet in am.  6.  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  Restarted home medications plus 0.5 mg dilaudid for break thru pain.  7.  Urinary tract infection.  U/A appeared positive but culture is negative.  Will stop Rocephin 01/29/12 after 3rd dose.  8. Cervical cancer-status post chemoradiation.  Discussed care with Dr. Forrestine Him office.  They will arrange for a follow up Radiation oncology appointment after discharge.  Biopsies from Colonoscopy will need to be followed up.  9.    DVT Prophylaxis - SCDs  10. Disposition-remain inpatient  11. CODE STATUS-full code   LOS: 3 days   Stephani Police 01/30/2012, 1:39 PM [phone]  Attending -I've seen and examined the patient, colonoscopy shows a sigmoid colon stricture, patient due for a barium contrast enema, the meantime we'll continue with current care. I agree with the above-noted assessment and plan.  Dr Windell Norfolk

## 2012-01-30 NOTE — Progress Notes (Signed)
  Radiation Oncology         [phone] ________________________________  Name: Vanessa [name] MRN: [id number]  Date: 11/14/2011  DOB: Oct 26, 1975  Chart Note:  I received a message from Telford Nab in gynecologic oncology regarding this patient. My understanding is that she was admitted with abdominal pain and CT imaging shows thickening of the colorectal lining suggestive of colitis which may be related to her radiation, chemotherapy, or possibly recurrent tumor. I understand she was admitted to the hospital for further management and is currently undergoing colonoscopy. I will be happy to follow up with her as an inpatient or outpatient at the direction of her clinical team. If she is felt to have colitis, she may benefit from local anti-inflammatories such as steroid enemas, rectal Rowasa, and nutritional consultation. Unfortunately, this patient has had a history of noncompliance with medical recommendations.  I hope she is willing to comply with recommended interventions for her pelvic pain following completion of her workup.  In terms of her colonoscopy evaluation, I would generally advise against deep or full thickness biopsies in the region of high-dose radiation, especially the anterior rectum out of concern for delayed wound healing following radiation that can potentially predispose the patient to fistula formation. ________________________________  Artist Pais Kathrynn Running, M.D.

## 2012-01-30 NOTE — Op Note (Signed)
[facility] [address] [place], [place]  [address]  FLEXIBLE SIGMOIDOSCOPY PROCEDURE REPORT  PATIENT:  Vanessa, [name]  MR#:  [id number] BIRTHDATE:  12/22/1975, 36 yrs. old  GENDER:  female ENDOSCOPIST:  Carie Caddy. Mace Weinberg, MD Referred by:  [facility] PROCEDURE DATE:  01/30/2012 PROCEDURE:  Flexible Sigmoidoscopy, diagnostic (incomplete colonoscopy) ASA CLASS:  Class III INDICATIONS:  abdominal pain, rectal bleeding, abnormal imaging, hx of cervical cancer MEDICATIONS:   Benadryl 25 mg IV, Fentanyl 125 mcg IV, Versed 10 mg IV  DESCRIPTION OF PROCEDURE:   After the risks benefits and alternatives of the procedure were thoroughly explained, informed consent was obtained.  Digital rectal exam was performed and revealed no rectal masses.   The EC-[id number] ([id number]) and [id number] ([id number]) endoscope was introduced through the anus and advanced to the sigmoid colon, limited by patient cooperation.   The quality of the prep was adequate.  The instrument was then slowly withdrawn as the mucosa was fully examined. 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   Severe angulation and fixed sigmoid colon.  Patient was agitated despite increased sedation and thus the procedure was terminated. Retroflexed views in the rectum not performed.    The scope was then withdrawn from the patient and the procedure terminated.  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  RECOMMENDATIONS: 1) Reschedule for OR with MAC sedation 2) Will attempt to pass very narrowed and fixed sigmoid with upper adult endoscope.  Carie Caddy. Rhea Belton, MD  n. Rosalie DoctorCarie Caddy. Decklin Weddington at 01/30/2012 09:17 AM  Sharyl Nimrod, [phone]

## 2012-01-30 NOTE — Anesthesia Procedure Notes (Signed)
Procedure Name: LMA Insertion Date/Time: 01/30/2012 1:11 PM Performed by: Gl[place]ora Score A Pre-anesthesia Checklist: Patient identified, Emergency Drugs available, Suction available and Patient being monitored Patient Re-evaluated:Patient Re-evaluated prior to inductionOxygen Delivery Method: Circle system utilized Preoxygenation: Pre-oxygenation with 100% oxygen Intubation Type: IV induction LMA: LMA with gastric port inserted LMA Size: 4.0 Number of attempts: 1 Placement Confirmation: positive ETCO2 and breath sounds checked- equal and bilateral Tube secured with: Tape Dental Injury: Teeth and Oropharynx as per pre-operative assessment

## 2012-01-30 NOTE — Discharge Summary (Signed)
Patient ID: Vanessa [name] MRN: [id number] DOB/AGE: 03/02/1976 36 y.o.  Admit date: 01/27/2012 Discharge date: 01/31/2012  Primary Care Physician:  Dorrene German, MD, MD Gynecological Surgeon:  Dr. Laurette Schimke Radiation Oncologist:  Dr. Margaretmary Dys  Discharge Diagnoses:   .Radiation colitis Present on Admission:  .Nausea & vomiting .Leucocytosis .Hypercalcemia .Constipation due to pain medication  Consultations: 1.  Gastroenterology   Procedures: 1.  Flexible Sigmoidoscopy 01/30/12 2.  Colonoscopy 01/30/12 3.  Barium Enema 01/31/12  Medication List  As of 01/31/2012  9:15 AM   STOP taking these medications         cephALEXin 500 MG capsule         TAKE these medications         hydrochlorothiazide 12.5 MG tablet   Commonly known as: HYDRODIURIL   Take 12.5 mg by mouth daily.      ibuprofen 800 MG tablet   Commonly known as: ADVIL,MOTRIN   Take 800 mg by mouth 3 (three) times daily.      morphine 15 MG 12 hr tablet   Commonly known as: MS CONTIN   Take 15-30 mg by mouth 2 (two) times daily. 15 mg every morning and 30 mg at night      ondansetron 4 MG tablet   Commonly known as: ZOFRAN   Take 4 mg by mouth every 6 (six) hours as needed. For nausea.      oxyCODONE-acetaminophen 5-325 MG per tablet   Commonly known as: PERCOCET   Take 1-2 tablets by mouth every 6 (six) hours as needed. For pain.            Consults:  [facility]  Brief H and P: Vanessa [name] is a 36 yo female with a history of cervical cancer s/p radiation therapy.  She is cared for by Dr. Laurette Schimke, GYN.  She presented to the [facility] ED with persistent nausea, vomiting, and lower abdominal pain.  Vanessa [name] had recently been treated for UTI as an outpatient. Her u/a appeared positive and initially her symptoms were felt to be due to pyelonephritis.    1.  Urinary Tract Infection - the patient was placed on IV Rocephin.  She continued to have pain atypical for a  uti.  Urine cultures were non revealing.  5/5 culture showed 50000 colonies of multiple morpho-types.  5/6 culture showed no growth.  After three days of IV rocephin antibiotics were discontinued.  2.  Radiation Colitis - CT Abdomen Pelvis revealed severe constipation and several irregularities in the bowel suspicious for radiation colitis and a possible obstructing mass lesion.  The patient underwent flexible sigmoidoscopy during the morning of 01/30/12 but was unable to tolerate it.  She was taken to the OR during the afternoon of 01/30/12 for colonoscopy under general anesthesia.  Colonoscopic Impression:   1) Radiation proctitis  2) Fixed and stenotic sigmoid colon, unable to be traversed.   Barium enema was recommended to evaluate the stenotic lesion.  Pending those results, patient will be discharged to home or surgical evaluation will be undertaken.  3.  Hypercalcemia.  Patient was on HCTZ prior to admission.  Serum PTH and Vitamin D 1,25 dihydroxy were ordered for 02/01/12.  4.  Hypertension.  Patient was on HCTZ prior to admission.  On 01/31/12 she was started on norvasc as her SBP consistently ran between 140 - 180.   Physical Exam on Discharge: General: Alert, awake, oriented x3, in no acute distress. Smiling, cheerful HEENT: No bruits,  no goiter. Heart: Regular rate and rhythm, without murmurs, rubs, gallops. Lungs: Clear to auscultation bilaterally. Abdomen: Soft, slight tenderness in RLQ, nondistended, positive bowel sounds. Extremities: No clubbing cyanosis or edema with positive pedal pulses. Neuro: Grossly intact, nonfocal.  Filed Vitals:   01/30/12 1919 01/30/12 2148 01/31/12 0406 01/31/12 0614  BP: 141/60 163/104 179/101 164/97  Pulse: 60 56 54   Temp:  98.4 F (36.9 C) 98.1 F (36.7 C)   TempSrc:  Oral Oral   Resp:  24 20   Height:      Weight:      SpO2:  99% 97%      Intake/Output Summary (Last 24 hours) at 01/31/12 0915 Last data filed at 01/31/12 0603  Gross  per 24 hour  Intake   2983 ml  Output   1250 ml  Net   1733 ml    Basic Metabolic Panel:  Lab 01/31/12 1610 01/30/12 1259 01/30/12 1011 01/29/12 0615  NA 135 139 -- --  K 4.2 3.9 -- --  CL 101 -- 101 --  CO2 24 -- 24 --  GLUCOSE 87 84 -- --  BUN 4* -- 3* --  CREATININE 0.47* -- 0.54 --  CALCIUM 11.1* -- 10.9* --  MG -- -- -- 1.6  PHOS -- -- -- --   CBC:  Lab 01/31/12 0535 01/30/12 1259 01/29/12 0615 01/28/12 0607 01/26/12 1940  WBC -- -- 10.9* 12.3* --  NEUTROABS -- -- -- -- 9.1*  HGB 10.5* 11.6* -- -- --  HCT 31.3* 34.0* -- -- --  MCV -- -- 89.3 89.6 --  PLT -- -- 224 229 --     Other Pertinent Lab Results:    Significant Diagnostic Studies:  Ct Abdomen Pelvis W Contrast  01/28/2012  *RADIOLOGY REPORT*  Clinical Data: Left lower quadrant pain with fever.  Nausea vomiting.  CT ABDOMEN AND PELVIS WITH CONTRAST   IMPRESSION:  10-15 cm segment of abnormal sigmoid colon in the lower central pelvis.  This segment shows irregular sequential wall thickening and adjacent edema/inflammation.  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  The cecum and proximal/mid ascending colon is dilated and completely stool-filled.  The stool tracks up to a soft tissue focus in the colon and there is no stool visualized in the transverse colon beyond this area of soft tissue attenuation.  Some type of an obstructing lesion in the colon at this location is suspected.  Colonoscopy may prove helpful to further evaluate.  I discussed these findings by telephone with the hospitalist mid- level Maren Reamer) at [place] hours on 01/28/2012.  Original Report Authenticated By: ERIC A. MANSELL, M.D.      Disposition and Follow-up: Stable for discharge to home with follow up listed below  Discharge Orders    Future Appointments: Provider: Department: Dept Phone: Center:   02/13/2012 8:30 AM [facility]  [phone] None   02/13/2012 9:00 AM Oneita Hurt, MD [facility] [phone] None     Follow-up Information    Follow up with Oneita Hurt, MD on 02/13/2012. (arrive at 8:30 am.  )    Contact information:   [address] [place] [address] [phone]       Follow up with Dorrene German, MD. Schedule an appointment as soon as possible for a visit in 2 weeks.   Contact information:   [address] [place] [address] [phone]  Time spent on Discharge: 40 min  Signed: Stephani Police 01/31/2012, 9:15 AM [phone]  I agree with the above noted assessment and plan, await results of colonoscopy, before beginning definite discharge plans  S Eddy Liszewski

## 2012-01-30 NOTE — Op Note (Signed)
[facility] [address] [place], [place]  [address]  COLONOSCOPY PROCEDURE REPORT  PATIENT:  Vanessa, [name]  MR#:  [id number] BIRTHDATE:  1976-09-10, 36 yrs. old  GENDER:  female ENDOSCOPIST:  Carie Caddy. Fernando Torry, MD REF. BY:  [facility], PROCEDURE DATE:  01/30/2012 PROCEDURE:  Incomplete colonoscopy ASA CLASS:  Class III INDICATIONS:  rectal bleeding, Abnormal CT of abdomen MEDICATIONS:   See Anesthesia Report.  DESCRIPTION OF PROCEDURE:   After the risks benefits and alternatives of the procedure were thoroughly explained, informed consent was obtained.  Digital rectal exam was performed and revealed no rectal masses.   The  upper adult endoscope was introduced through the anus and advanced to the sigmoid colon, limited by stenosis.    The quality of the prep was adequate.. The instrument was then slowly withdrawn as the colon was fully examined. <<PROCEDUREIMAGES>>  FINDINGS:  There were mucosal changes consistent with radiation proctitis seen in the rectum.  No active bleeding.  A fixed and stenotic sigmoid was encounter which could not be transversed despite the use of adult upper endoscope, abdominal pressure, change in position (left lateral, supine, right lateral), and water insufflation of the colon.   Retroflexed views in the rectum revealed no abnormalities. The scope was then withdrawn from the cecum and the procedure completed.  COMPLICATIONS:  None  ENDOSCOPIC IMPRESSION: 1) Radiation proctitis 2) Fixed and stenotic sigmoid colon, unable to be traversed. See above  RECOMMENDATIONS: 1) Barium enema to evaluate the abnormalities seen by CT abdomen. 2) If barium enema reveals abnormalities, the patient will likely need surgical consultation for laproscopic evaluation. 3) If rectal bleeding returns, consider sucralfate 1 g PR BID  Vanessa [name] M. Rhea Belton, MD  n. Rosalie DoctorCarie Caddy. Farooq Petrovich at 01/30/2012 05:03 PM  Sharyl Nimrod,  [phone]

## 2012-01-31 ENCOUNTER — Inpatient Hospital Stay (HOSPITAL_COMMUNITY): Payer: Medicaid Other

## 2012-01-31 ENCOUNTER — Encounter (HOSPITAL_COMMUNITY): Payer: Self-pay | Admitting: Internal Medicine

## 2012-01-31 DIAGNOSIS — K56609 Unspecified intestinal obstruction, unspecified as to partial versus complete obstruction: Secondary | ICD-10-CM

## 2012-01-31 DIAGNOSIS — K56699 Other intestinal obstruction unspecified as to partial versus complete obstruction: Secondary | ICD-10-CM | POA: Diagnosis present

## 2012-01-31 DIAGNOSIS — C539 Malignant neoplasm of cervix uteri, unspecified: Secondary | ICD-10-CM

## 2012-01-31 DIAGNOSIS — R112 Nausea with vomiting, unspecified: Secondary | ICD-10-CM

## 2012-01-31 DIAGNOSIS — R1084 Generalized abdominal pain: Secondary | ICD-10-CM

## 2012-01-31 DIAGNOSIS — N12 Tubulo-interstitial nephritis, not specified as acute or chronic: Secondary | ICD-10-CM

## 2012-01-31 LAB — COMPREHENSIVE METABOLIC PANEL
ALT: 10 U/L (ref 0–35)
AST: 14 U/L (ref 0–37)
Alkaline Phosphatase: 108 U/L (ref 39–117)
CO2: 24 mEq/L (ref 19–32)
Chloride: 101 mEq/L (ref 96–112)
GFR calc non Af Amer: >90 mL/min (ref >90)
Sodium: 135 mEq/L (ref 135–145)
Total Bilirubin: 0.3 mg/dL (ref 0.3–1.2)

## 2012-01-31 MED ORDER — OXYCODONE-ACETAMINOPHEN 5-325 MG PO TABS
1.0000 | ORAL_TABLET | ORAL | Status: DC | PRN
  Administered 2012-01-31 – 2012-02-03 (×10): 1 via ORAL
  Filled 2012-01-31 (×10): qty 1

## 2012-01-31 MED ORDER — POLYETHYLENE GLYCOL 3350 17 G PO PACK
17.0000 g | PACK | Freq: Two times a day (BID) | ORAL | Status: DC
  Administered 2012-01-31 – 2012-02-03 (×5): 17 g via ORAL
  Filled 2012-01-31 (×11): qty 1

## 2012-01-31 MED ORDER — HYDRALAZINE HCL 20 MG/ML IJ SOLN
5.0000 mg | Freq: Once | INTRAMUSCULAR | Status: AC
  Administered 2012-01-31: 5 mg via INTRAVENOUS
  Filled 2012-01-31: qty 0.25

## 2012-01-31 MED ORDER — SODIUM CHLORIDE 0.9 % IV SOLN
INTRAVENOUS | Status: DC
  Administered 2012-01-31 – 2012-02-03 (×3): via INTRAVENOUS

## 2012-01-31 MED ORDER — SODIUM CHLORIDE 0.9 % IV SOLN
INTRAVENOUS | Status: AC
  Administered 2012-01-31: 10:00:00 via INTRAVENOUS

## 2012-01-31 MED ORDER — FUROSEMIDE 10 MG/ML IJ SOLN
60.0000 mg | Freq: Once | INTRAMUSCULAR | Status: AC
  Administered 2012-01-31: 60 mg via INTRAVENOUS
  Filled 2012-01-31: qty 6

## 2012-01-31 MED ORDER — AMLODIPINE BESYLATE 5 MG PO TABS
5.0000 mg | ORAL_TABLET | Freq: Every day | ORAL | Status: DC
  Administered 2012-01-31 – 2012-02-03 (×4): 5 mg via ORAL
  Filled 2012-01-31 (×4): qty 1

## 2012-01-31 NOTE — Progress Notes (Signed)
Patient ID: Vanessa [name], female   DOB: 21-Aug-1976, 36 y.o.   MRN: [id number]   PATIENT DETAILS Name: Vanessa [name] Age: 36 y.o. Sex: female Date of Birth: Aug 27, 1976 Admit Date: 01/27/2012 WUJ:WJXBJYN,WGNFA A, MD, MD OB/GYN:  Dr. Nelly Rout Oncology:   Radiation Oncologist: Dr. Margaretmary Dys  Consults:  [facility]  Interim History: This is a 36 year old female with history of cervical cancer status post chemotherapy and radiation She was seen in the emergency room on May 5 with abdominal pain nausea vomiting. She was diagnosed as UTI and sent home on medication. She came back with persistent nausea vomiting as well as lower abdominal pain.  She reports intermittent BRBPR over the past couple of months.  CT Abdomen Pelvis shows severe constipation and colitis.  [facility] has been consulted and is moving forward with endoscopic evaluation.  Subjective: "I feel better, and I'm still having bowel movements"  Objective: Weight change:   Intake/Output Summary (Last 24 hours) at 01/31/12 0707 Last data filed at 01/31/12 0603  Gross per 24 hour  Intake   2983 ml  Output   1250 ml  Net   1733 ml   Blood pressure 164/97, pulse 54, temperature 98.1 F (36.7 C), temperature source Oral, resp. rate 20, height 5\' 4"  (1.626 m), weight 72 kg (158 lb 11.7 oz), SpO2 97.00%. Filed Vitals:   01/30/12 1919 01/30/12 2148 01/31/12 0406 01/31/12 0614  BP: 141/60 163/104 179/101 164/97  Pulse: 60 56 54   Temp:  98.4 F (36.9 C) 98.1 F (36.7 C)   TempSrc:  Oral Oral   Resp:  24 20   Height:      Weight:      SpO2:  99% 97%     Physical Exam:   General exam: Awake and alert, speech is clear, smiling Chest: Bilaterally clear to auscultation CVS: S1-S2 regular, no m/r/g Abdomen: Soft good bowel sounds, No further tenderness on the left, Mildly TTP on LRQ Neurological exam: Nonfocal  Basic Metabolic Panel:  Lab 01/30/12 2130 01/30/12 1011 01/29/12 0615  NA 139 138  --  K 3.9 3.8 --  CL -- 101 101  CO2 -- 24 24  GLUCOSE 84 86 --  BUN -- 3* 3*  CREATININE -- 0.54 0.52  CALCIUM -- 10.9* 10.4  MG -- -- 1.6  PHOS -- -- --   CBC:  Lab 01/31/12 0535 01/30/12 1259 01/29/12 0615 01/28/12 0607 01/26/12 1940  WBC -- -- 10.9* 12.3* --  NEUTROABS -- -- -- -- 9.1*  HGB 10.5* 11.6* -- -- --  HCT 31.3* 34.0* -- -- --  MCV -- -- 89.3 89.6 --  PLT -- -- 224 229 --   Urinalysis:  Results for Vanessa, [name] (MRN [id number]) as of 01/28/2012 15:01  Ref. Range 01/26/2012 20:39  Color, Urine Latest Range: YELLOW  AMBER (A)  APPearance Latest Range: CLEAR  CLOUDY (A)  Specific Gravity, Urine Latest Range: 1.005-1.030  1.029  pH Latest Range: 4.6-8.0  7.5  Glucose, UA Latest Range: NEGATIVE mg/dL NEGATIVE  Bilirubin Urine Latest Range: NEGATIVE  SMALL (A)  Ketones, ur Latest Range: NEGATIVE mg/dL 15 (A)  Protein Latest Range: NEGATIVE mg/dL 30 (A)  Urobilinogen, UA Latest Range: 0.0-1.0 mg/dL 2.0 (H)  Nitrite Latest Range: NEGATIVE  NEGATIVE  Leukocytes, UA Latest Range: NEGATIVE  LARGE (A)  Hgb urine dipstick Latest Range: NEGATIVE  NEGATIVE  Urine-Other No range found MUCOUS PRESENT  WBC, UA Latest Range: <3 WBC/hpf 21-50  Squamous Epithelial / LPF Latest Range: RARE  FEW (A)  Bacteria, UA Latest Range: RARE  FEW (A)    Micro Results: Recent Results (from the past 240 hour(s))  URINE CULTURE     Status: Normal   Collection Time   01/26/12  8:39 PM      Component Value Range Status Comment   Specimen Description URINE, RANDOM   Final    Special Requests NONE   Final    Culture  Setup Time [id number]   Final    Colony Count 50,000 COLONIES/ML   Final    Culture     Final    Value: Multiple bacterial morphotypes present, none predominant. Suggest appropriate recollection if clinically indicated.   Report Status 01/28/2012 FINAL   Final   URINE CULTURE     Status: Normal   Collection Time   01/27/12 11:54 PM      Component Value Range Status  Comment   Specimen Description URINE, RANDOM   Final    Special Requests NONE   Final    Culture  Setup Time [id number]   Final    Colony Count NO GROWTH   Final    Culture NO GROWTH   Final    Report Status 01/29/2012 FINAL   Final     Studies/Results: Scheduled Meds:    . sodium chloride   Intravenous Once  . chlorhexidine  15 mL Mouth Rinse BID  . hydrALAZINE  5 mg Intravenous Once  . morphine  15 mg Oral Daily  . morphine  30 mg Oral QHS  . potassium chloride  20 mEq Oral BID  . DISCONTD: antiseptic oral rinse  15 mL Mouth Rinse q12n4p   Continuous Infusions:    . lactated ringers 20 mL/hr at 01/30/12 1254  . sodium chloride 0.9 % 1,000 mL with potassium chloride 40 mEq infusion 75 mL/hr at 01/30/12 1429   PRN Meds:.HYDROmorphone (DILAUDID) injection, ondansetron (ZOFRAN) IV, ondansetron, DISCONTD: diphenhydrAMINE, DISCONTD: fentaNYL, DISCONTD: fentaNYL, DISCONTD: midazolam  Anti-infectives:  Anti-infectives     Start     Dose/Rate Route Frequency Ordered Stop   01/27/12 2200   cefTRIAXone (ROCEPHIN) 1 g in dextrose 5 % 50 mL IVPB        1 g 100 mL/hr over 30 Minutes Intravenous Every 24 hours 01/27/12 2152 01/29/12 2359   01/27/12 1915   cefTRIAXone (ROCEPHIN) 1 g in dextrose 5 % 50 mL IVPB        1 g 100 mL/hr over 30 Minutes Intravenous  Once 01/27/12 1914 01/27/12 1957          Assessment/Plan: Principal Problem:  *Nausea & vomiting Active Problems:  Pyelonephritis  Leucocytosis  Hypercalcemia  Abnormal finding on GI tract imaging  Radiation colitis  Constipation due to pain medication   1.  Nausea and vomiting -  resolved.  Possibly related to both severe constipation and UTI.    2.  LLQ PelvicPain - Constipation and sigmoid colitis on CT.  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  Patient under went flex sig and radiation proctitis was noted.  The procedure was interrupted due to the patient's discomfort and she was   endoscoped under general anesthesia in the OR during the afternoon of 01/30/12.  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  The patient will under go barium enema 01/31/12.  If there are no abnormalities her diet can be advanced and she can be  discharged to home.  If there are abnormalities general surgery may need to be consulted.  3.  Hypercalcemia  -Corrected Serum calcium 11.7.  Possibly caused by HCTZ, but concerned for malignancy.  Will increase fluids and give IV lasix today.  HCTZ is discontinued.  Norvasc started.  4.  Rectal Bleeding - with drop in HGb (probably from hemodilution with IVF, I don't believe she's having significant GI blood loss).  Monitor H/H periodically.  Hgb is rising (01/30/12) 11.6 and stable.  5.  Hypokalemia - resolved.  likely 2/2 to nausea and vomiting, will check magnesium.  Received multiple IV runs 5/7.  will replete again and check bmet in am.  6.  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  Restarted home medications plus 0.5 mg dilaudid for break thru pain.    7.  Urinary tract infection.  U/A appeared positive but culture is negative.  Will stop Rocephin 01/29/12 after 3rd dose.  8. Cervical cancer-status post chemoradiation.  Discussed care with Dr. Forrestine Him office.  They will arrange for a follow up Radiation oncology appointment after discharge.   9.    DVT Prophylaxis - SCDs  10. Disposition-remain inpatient  11. CODE STATUS-full code   LOS: 4 days   Stephani Police 01/31/2012, 7:07 AM [phone]  Attending -I agree with the assessment and plan as outlined above. Await Barium Enema. Stop HCTZ given Hypercalcemia. Remain inpatient till work up complete  Dr Windell Norfolk

## 2012-01-31 NOTE — Consult Note (Signed)
Reason for Consult:colonic stricture Referring Physician: Pyrtle  Vanessa Vanessa [name] is an 36 y.o. female.  HPI: Asked to see patient at the request of Dr Vanessa Vanessa [name] due to Vanessa [name] sigmoid colon stricture found on colonoscopy,  CT and BE at 20 cm.  Pt has Vanessa [name] history of cervical cancer and radiation in 2011.  Many week history of nausea and vomiting.  [place] now.  Denies abdominal pain.  High grade stricture noted.Able to tolerate liquids without nausea or vomiting.  No fever or chills.Colonoscopy shows mild proctitis.  BE shows 95 % narrowing at 20 cm from anal verge.    Past Medical History  Diagnosis Date  . Hypertension   . Cervical cancer     Past Surgical History  Procedure Date  . Cholecystectomy   . Radiation implants   . Colonoscopy 01/30/2012    Procedure: COLONOSCOPY;  Surgeon: Vanessa Fiedler, MD;  Location[facility] ENDOSCOPY;  Service: Gastroenterology;  Laterality: N/Vanessa [name];    History reviewed. No pertinent family history.  Social History:  reports that she has been smoking.  She does not have any smokeless tobacco history on file. She reports that she does not drink alcohol or use illicit drugs.  Allergies:  Allergies  Allergen Reactions  . Morphine And Related Hives    Medications:  I have reviewed the patient's current medications. Prior to Admission:  Prescriptions prior to admission  Medication Sig Dispense Refill  . hydrochlorothiazide (HYDRODIURIL) 12.5 MG tablet Take 12.5 mg by mouth daily.      Marland Kitchen ibuprofen (ADVIL,MOTRIN) 800 MG tablet Take 800 mg by mouth 3 (three) times daily.      Marland Kitchen morphine (MS CONTIN) 15 MG 12 hr tablet Take 15-30 mg by mouth 2 (two) times daily. 15 mg every morning and 30 mg at night      . ondansetron (ZOFRAN) 4 MG tablet Take 4 mg by mouth every 6 (six) hours as needed. For nausea.      Marland Kitchen oxyCODONE-acetaminophen (PERCOCET) 5-325 MG per tablet Take 1-2 tablets by mouth every 6 (six) hours as needed. For pain.      Marland Kitchen DISCONTD: cephALEXin (KEFLEX) 500 MG  capsule Take 500 mg by mouth 4 (four) times daily. Started today and ends on the 16th.       Scheduled:   . amLODipine  5 mg Oral Daily  . chlorhexidine  15 mL Mouth Rinse BID  . furosemide  60 mg Intravenous Once  . hydrALAZINE  5 mg Intravenous Once  . morphine  15 mg Oral Daily  . morphine  30 mg Oral QHS  . polyethylene glycol  17 g Oral BID  . potassium chloride  20 mEq Oral BID   Continuous:   . sodium chloride 125 mL/hr at 01/31/12 1029  . sodium chloride 50 mL/hr at 01/31/12 1826  . lactated ringers 20 mL/hr at 01/30/12 1254  . DISCONTD: sodium chloride 0.9 % 1,000 mL with potassium chloride 40 mEq infusion 75 mL/hr at 01/30/12 1429   ZOX:WRUEAVWUJWJ (ZOFRAN) IV, ondansetron, DISCONTD: fentaNYL, DISCONTD:  HYDROmorphone (DILAUDID) injection  Results for orders placed during the hospital encounter of 01/27/12 (from the past 48 hour(s))  BASIC METABOLIC PANEL     Status: Abnormal   Collection Time   01/30/12 10:11 AM      Component Value Range Comment   Sodium 138  135 - 145 (mEq/L)    Potassium 3.8  3.5 - 5.1 (mEq/L)    Chloride 101  96 - 112 (mEq/L)  CO2 24  19 - 32 (mEq/L)    Glucose, Bld 86  70 - 99 (mg/dL)    BUN 3 (*) 6 - 23 (mg/dL)    Creatinine, Ser 4.33  0.50 - 1.10 (mg/dL)    Calcium 29.5 (*) 8.4 - 10.5 (mg/dL)    GFR calc non Af Amer >90  >90 (mL/min)    GFR calc Af Amer >90  >90 (mL/min)   POCT I-STAT 4, (NA,K, GLUC, HGB,HCT)     Status: Abnormal   Collection Time   01/30/12 12:59 PM      Component Value Range Comment   Sodium 139  135 - 145 (mEq/L)    Potassium 3.9  3.5 - 5.1 (mEq/L)    Glucose, Bld 84  70 - 99 (mg/dL)    HCT 18.8 (*) 41.6 - 46.0 (%)    Hemoglobin 11.6 (*) 12.0 - 15.0 (g/dL)   COMPREHENSIVE METABOLIC PANEL     Status: Abnormal   Collection Time   01/31/12  5:35 AM      Component Value Range Comment   Sodium 135  135 - 145 (mEq/L)    Potassium 4.2  3.5 - 5.1 (mEq/L)    Chloride 101  96 - 112 (mEq/L)    CO2 24  19 - 32 (mEq/L)     Glucose, Bld 87  70 - 99 (mg/dL)    BUN 4 (*) 6 - 23 (mg/dL)    Creatinine, Ser 6.06 (*) 0.50 - 1.10 (mg/dL)    Calcium 30.1 (*) 8.4 - 10.5 (mg/dL)    Total Protein 7.1  6.0 - 8.3 (g/dL)    Albumin 3.2 (*) 3.5 - 5.2 (g/dL)    AST 14  0 - 37 (U/L)    ALT 10  0 - 35 (U/L)    Alkaline Phosphatase 108  39 - 117 (U/L)    Total Bilirubin 0.3  0.3 - 1.2 (mg/dL)    GFR calc non Af Amer >90  >90 (mL/min)    GFR calc Af Amer >90  >90 (mL/min)   HEMOGLOBIN AND HEMATOCRIT, BLOOD     Status: Abnormal   Collection Time   01/31/12  5:35 AM      Component Value Range Comment   Hemoglobin 10.5 (*) 12.0 - 15.0 (g/dL)    HCT 60.1 (*) 09.3 - 46.0 (%)     Dg Colon W/cm - Wo/w Kub  01/31/2012  *RADIOLOGY REPORT*  Clinical Data: Rectal bleeding.  Sigmoid wall thickening on the CT. Incomplete colonoscopy.  History of cervical cancer and pelvic radiation.  SINGLE COLUMN BARIUM ENEMA  Technique:  Initial scout AP supine abdominal image obtained to insure adequate colon cleansing.  Barium was introduced into the colon in Vanessa [name] retrograde fashion and refluxed from the rectum to the cecum. Spot images of the colon followed by overhead radiographs were obtained.  Fluoroscopy time: 2.23 minutes.  Comparison:  CT 01/28/2012  Findings:  Single contrast barium enema was performed as I felt this would give [place] the better look at the area of concern in the sigmoid colon as well as be better tolerated by the patient.  The patient had severe pain during insertion of the rectal tip.  The barium was instilled into the colon.  This shows Vanessa [name] fixed irregular focal stricture/narrowing within the mid sigmoid colon corresponding to the abnormality on CT.  Given the patient's history, I suspect this represents radiation colitis or scarring from radiation.  The remainder of the bladder is unremarkable.  IMPRESSION:  Fixed focal irregular stricture in the sigmoid colon as seen on CT, likely related to radiation.  Original Report Authenticated By:  Vanessa Vanessa [name], M.D.    Review of Systems  Constitutional: Negative.   HENT: Negative.   Eyes: Negative.   Respiratory: Negative.   Cardiovascular: Negative.   Gastrointestinal: Negative.   Genitourinary: Negative.   Musculoskeletal: Negative.   Skin: Negative.   Neurological: Negative.   Endo/Heme/Allergies: Negative.   Psychiatric/Behavioral: Negative.    Blood pressure 156/95, pulse 61, temperature 98.2 F (36.8 C), temperature source Oral, resp. rate 18, height 5\' 4"  (1.626 m), weight 158 lb 11.7 oz (72 kg), SpO2 99.00%. Physical Exam  Constitutional: She is oriented to person, place, and time. She appears well-developed and well-nourished.  HENT:  Head: Normocephalic and atraumatic.  Eyes: EOM are normal. Pupils are equal, round, and reactive to light.  Neck: Normal range of motion. Neck supple.  Cardiovascular: Normal rate and regular rhythm.   Respiratory: Effort normal and breath sounds normal.  GI: Soft. Bowel sounds are normal. She exhibits distension. There is no tenderness. There is no rebound and no guarding.  Musculoskeletal: Normal range of motion.  Neurological: She is alert and oriented to person, place, and time.  Skin: Skin is warm and dry.    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 Would keep on clears.  Will need sigmoid colectomy with diversion since this is secondary to radiation and distal colon shows signs of proctitis.  Timing in next 2 - 3 days.  Not emergent.   Vanessa Vanessa [name]. 01/31/2012, 6:20 PM

## 2012-01-31 NOTE — Progress Notes (Signed)
[facility] Progress Note  Subjective: Pt tolerating clears. Had BE this am, which was very painful. She is in good spirits now.  No n/v.  Objective:  Vital signs in last 24 hours: Temp:  [98 F (36.7 C)-98.4 F (36.9 C)] 98.2 F (36.8 C) (05/10 1420) Pulse Rate:  [54-66] 61  (05/10 1420) Resp:  [18-24] 18  (05/10 1420) BP: (141-179)/(60-104) 156/95 mmHg (05/10 1420) SpO2:  [97 %-99 %] 99 % (05/10 1420) Last BM Date: 01/30/12 Gen: awake, alert, NAD HEENT: anicteric, op clear CV: RRR, no mrg Pulm: CTA b/l Abd: soft, mild diffuse tenderness, mild distention, +BS Ext: no c/c/e Neuro: nonfocal  Intake/Output from previous day: 05/09 0701 - 05/10 0700 In: 2983 [P.O.:358; I.V.:2625] Out: 1250 [Urine:1250] Intake/Output this shift: Total I/O In: 600 [P.O.:600] Out: -   Lab Results:  Basename 01/31/12 0535 01/30/12 1259 01/29/12 0615  WBC -- -- 10.9*  HGB 10.5* 11.6* 10.8*  HCT 31.3* 34.0* 31.7*  PLT -- -- 224   BMET  Basename 01/31/12 0535 01/30/12 1259 01/30/12 1011 01/29/12 0615  NA 135 139 138 --  K 4.2 3.9 3.8 --  CL 101 -- 101 101  CO2 24 -- 24 24  GLUCOSE 87 84 86 --  BUN 4* -- 3* 3*  CREATININE 0.47* -- 0.54 0.52  CALCIUM 11.1* -- 10.9* 10.4   LFT  Basename 01/31/12 0535  PROT 7.1  ALBUMIN 3.2*  AST 14  ALT 10  ALKPHOS 108  BILITOT 0.3  BILIDIR --  IBILI --    Studies/Results: Dg Colon W/cm - Wo/w Kub  01/31/2012  *RADIOLOGY REPORT*  Clinical Data: Rectal bleeding.  Sigmoid wall thickening on the CT. Incomplete colonoscopy.  History of cervical cancer and pelvic radiation.  SINGLE COLUMN BARIUM ENEMA  Technique:  Initial scout AP supine abdominal image obtained to insure adequate colon cleansing.  Barium was introduced into the colon in a retrograde fashion and refluxed from the rectum to the cecum. Spot images of the colon followed by overhead radiographs were obtained.  Fluoroscopy time: 2.23 minutes.  Comparison:  CT 01/28/2012   Findings:  Single contrast barium enema was performed as I felt this would give [place] the better look at the area of concern in the sigmoid colon as well as be better tolerated by the patient.  The patient had severe pain during insertion of the rectal tip.  The barium was instilled into the colon.  This shows a fixed irregular focal stricture/narrowing within the mid sigmoid colon corresponding to the abnormality on CT.  Given the patient's history, I suspect this represents radiation colitis or scarring from radiation.  The remainder of the bladder is unremarkable.  IMPRESSION: Fixed focal irregular stricture in the sigmoid colon as seen on CT, likely related to radiation.  Original Report Authenticated By: Cyndie Chime, M.D.   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  1. Sigmoid stricture -- incomplete colonoscopy attempted twice yesterday, the 2nd time in OR with upper endoscope, but unable to pass very tight sigmoid stricture.  Barium colon study done today which shows a very narrow, sigmoid stricture.  Given radiation was in 2011 and duration of complaints, this likely represents a radiation-induced stricture.   --No endoscopic options as I cannot traverse this very narrowed stricture.  Rec [facility] consult for resection of stricture.  I have discussed this with patient at bedside and she understands the plan/my recs. --For now, need to keep stools as close to liquid as possible given significant narrowing in sigmoid. Miralax 17g BID for now. --I called [facility], Dr. [name] is on call.  2. Radiation proctitis -- fairly mild.  No bleeding of late.  If rebleeding occurs, would consider sucralfate suspension enemas 1g q12h.  If topical medical therapy is not working, then flex sig with APC ablation could be considered (though I feel stricture needs to be addressed  1st).  Will sign off, call with questions.  Principal Problem:  *Nausea & vomiting Active Problems:  Pyelonephritis  Leucocytosis  Hypercalcemia  Abnormal finding on GI tract imaging  Radiation colitis  Constipation due to pain medication     LOS: 4 days   Mervyn Pflaum M  01/31/2012, 2:53 PM

## 2012-02-01 DIAGNOSIS — R112 Nausea with vomiting, unspecified: Secondary | ICD-10-CM

## 2012-02-01 DIAGNOSIS — R1084 Generalized abdominal pain: Secondary | ICD-10-CM

## 2012-02-01 DIAGNOSIS — N12 Tubulo-interstitial nephritis, not specified as acute or chronic: Secondary | ICD-10-CM

## 2012-02-01 LAB — BASIC METABOLIC PANEL
CO2: 26 mEq/L (ref 19–32)
Calcium: 10.8 mg/dL — ABNORMAL HIGH (ref 8.4–10.5)
Creatinine, Ser: 0.61 mg/dL (ref 0.50–1.10)
GFR calc Af Amer: >90 mL/min (ref >90)
Sodium: 137 mEq/L (ref 135–145)

## 2012-02-01 MED ORDER — PEG 3350-KCL-NA BICARB-NACL 420 G PO SOLR
2000.0000 mL | Freq: Once | ORAL | Status: AC
  Administered 2012-02-01: 2000 mL via ORAL
  Filled 2012-02-01: qty 4000

## 2012-02-01 MED ORDER — HEPARIN SODIUM (PORCINE) 5000 UNIT/ML IJ SOLN
5000.0000 [IU] | Freq: Three times a day (TID) | INTRAMUSCULAR | Status: DC
  Administered 2012-02-01 – 2012-02-03 (×5): 5000 [IU] via SUBCUTANEOUS
  Filled 2012-02-01 (×9): qty 1

## 2012-02-01 NOTE — Progress Notes (Signed)
PATIENT DETAILS Name: CARYL FATE Age: 36 y.o. Sex: female Date of Birth: 1976/07/27 Admit Date: 01/27/2012 OZH:[id number],ONGEX A, MD, MD  Subjective: Some pelvic pain-otherwise no major complaints  Objective: Vital signs in last 24 hours: Filed Vitals:   01/31/12 1014 01/31/12 1420 01/31/12 2125 02/01/12 0432  BP: 153/79 156/95 131/87 134/85  Pulse:  61 60 68  Temp:  98.2 F (36.8 C) 97.7 F (36.5 C) 97.8 F (36.6 C)  TempSrc:   Oral Oral  Resp:  18 16 20   Height:      Weight:    73.4 kg (161 lb 13.1 oz)  SpO2:  99% 99% 99%    Weight change:   Body mass index is 27.78 kg/(m^2).  Intake/Output from previous day:  Intake/Output Summary (Last 24 hours) at 02/01/12 1418 Last data filed at 02/01/12 0900  Gross per 24 hour  Intake 2775.75 ml  Output    850 ml  Net 1925.75 ml    PHYSICAL EXAM: Gen Exam: Awake and alert with clear speech.   Neck: Supple, No JVD.   Chest: B/L Clear.  CVS: S1 S2 Regular, no murmurs.  Abdomen: soft, BS +, mildly  Tender pelvic area, non distended. Extremities: no edema, lower extremities warm to touch. Neurologic: Non Focal.   Skin: No Rash.   Wounds: N/A.    CONSULTS:  GI and general surgery  LAB RESULTS: CBC  Lab 01/31/12 0535 01/30/12 1259 01/29/12 0615 01/28/12 0607 01/27/12 1837 01/26/12 1940  WBC -- -- 10.9* 12.3* 11.5* 12.3*  HGB 10.5* 11.6* 10.8* 10.9* 11.9* --  HCT 31.3* 34.0* 31.7* 31.8* 33.7* --  PLT -- -- 224 229 267 292  MCV -- -- 89.3 89.6 89.4 89.8  MCH -- -- 30.4 30.7 31.6 31.3  MCHC -- -- 34.1 34.3 35.3 34.9  RDW -- -- 13.4 13.7 13.8 13.7  LYMPHSABS -- -- -- -- -- 1.9  MONOABS -- -- -- -- -- 1.2*  EOSABS -- -- -- -- -- 0.1  BASOSABS -- -- -- -- -- 0.0  BANDABS -- -- -- -- -- --    Chemistries   Lab 02/01/12 0705 01/31/12 0535 01/30/12 1259 01/30/12 1011 01/29/12 0615 01/28/12 0607  NA 137 135 139 138 134* --  K 3.5 4.2 3.9 3.8 2.9* --  CL 99 101 -- 101 101 103  CO2 26 24 -- 24 24 23   GLUCOSE  86 87 84 86 115* --  BUN 4* 4* -- 3* 3* <3*  CREATININE 0.61 0.47* -- 0.54 0.52 0.44*  CALCIUM 10.8* 11.1* -- 10.9* 10.4 10.0  MG -- -- -- -- 1.6 --    GFR Estimated Creatinine Clearance: 95.5 ml/min (by C-G formula based on Cr of 0.61).  Coagulation profile No results found for this basename: INR:5,PROTIME:5 in the last 168 hours  Cardiac Enzymes No results found for this basename: CK:3,CKMB:3,TROPONINI:3,MYOGLOBIN:3 in the last 168 hours  No components found with this basename: POCBNP:3 No results found for this basename: DDIMER:2 in the last 72 hours No results found for this basename: HGBA1C:2 in the last 72 hours No results found for this basename: CHOL:2,HDL:2,LDLCALC:2,TRIG:2,CHOLHDL:2,LDLDIRECT:2 in the last 72 hours No results found for this basename: TSH,T4TOTAL,FREET3,T3FREE,THYROIDAB in the last 72 hours No results found for this basename: VITAMINB12:2,FOLATE:2,FERRITIN:2,TIBC:2,IRON:2,RETICCTPCT:2 in the last 72 hours No results found for this basename: LIPASE:2,AMYLASE:2 in the last 72 hours  Urine Studies No results found for this basename: UACOL:2,UAPR:2,USPG:2,UPH:2,UTP:2,UGL:2,UKET:2,UBIL:2,UHGB:2,UNIT:2,UROB:2,ULEU:2,UEPI:2,UWBC:2,URBC:2,UBAC:2,CAST:2,CRYS:2,UCOM:2,BILUA:2 in the last 72 hours  MICROBIOLOGY: Recent Results (from the past  240 hour(s))  URINE CULTURE     Status: Normal   Collection Time   01/26/12  8:39 PM      Component Value Range Status Comment   Specimen Description URINE, RANDOM   Final    Special Requests NONE   Final    Culture  Setup Time [id number]   Final    Colony Count 50,000 COLONIES/ML   Final    Culture     Final    Value: Multiple bacterial morphotypes present, none predominant. Suggest appropriate recollection if clinically indicated.   Report Status 01/28/2012 FINAL   Final   URINE CULTURE     Status: Normal   Collection Time   01/27/12 11:54 PM      Component Value Range Status Comment   Specimen Description URINE, RANDOM    Final    Special Requests NONE   Final    Culture  Setup Time [id number]   Final    Colony Count NO GROWTH   Final    Culture NO GROWTH   Final    Report Status 01/29/2012 FINAL   Final     RADIOLOGY STUDIES/RESULTS: Ct Abdomen Pelvis W Contrast  01/28/2012  *RADIOLOGY REPORT*  Clinical Data: Left lower quadrant pain with fever.  Nausea vomiting.  CT ABDOMEN AND PELVIS WITH CONTRAST  Technique:  Multidetector CT imaging of the abdomen and pelvis was performed following the standard protocol during bolus administration of intravenous contrast.  Contrast: OMNIPAQUE IOHEXOL 300 MG/ML  SOLN   Comparison: 11/14/2011  Findings: No focal abnormalities seen in the liver or spleen.  The stomach, duodenum, pancreas, and adrenal glands are unremarkable. Gallbladder is surgically absent.  Kidneys have normal imaging features.  No abdominal aortic aneurysm.  There is no free fluid or lymphadenopathy in the abdomen.  Imaging through the pelvis demonstrates a 10-15 cm segment of sigmoid colon shows circumferential irregular wall thickening. There is edema/inflammation in the sigmoid mesocolon and also involving the soft tissues of the pelvic floor.  The cecal tip is in the lower midline anterior pelvis.  The cecum and ascending colon contain a large volume of stool.  There is no stool visualized in the transverse colon although gas is seen in the transverse colon.  The left colon is decompressed.  There is a prominent area of soft tissue attenuation of transition from the dilated stool filled right colon to the gas filled, but none stool containing, transverse colon.  Obstructing mass lesion in the ascending colon distally is a consideration.  Bone windows reveal no worrisome lytic or sclerotic osseous lesions.  IMPRESSION:  10-15 cm segment of abnormal sigmoid colon in the lower central pelvis.  This segment shows irregular sequential wall thickening and adjacent edema/inflammation.  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  The cecum and proximal/mid ascending colon is dilated and completely stool-filled.  The stool tracks up to a soft tissue focus in the colon and there is no stool visualized in the transverse colon beyond this area of soft tissue attenuation.  Some type of an obstructing lesion in the colon at this location is suspected.  Colonoscopy may prove helpful to further evaluate.  I discussed these findings by telephone with the hospitalist mid- level Maren Reamer) at [place] hours on 01/28/2012.  Original Report Authenticated By: ERIC A. MANSELL, M.D.   Dg Colon W/cm - Wo/w Kub  01/31/2012  *RADIOLOGY REPORT*  Clinical Data: Rectal bleeding.  Sigmoid wall thickening on the CT. Incomplete colonoscopy.  History of cervical cancer and pelvic radiation.  SINGLE COLUMN BARIUM ENEMA  Technique:  Initial scout AP supine abdominal image obtained to insure adequate colon cleansing.  Barium was introduced into the colon in a retrograde fashion and refluxed from the rectum to the cecum. Spot images of the colon followed by overhead radiographs were obtained.  Fluoroscopy time: 2.23 minutes.  Comparison:  CT 01/28/2012  Findings:  Single contrast barium enema was performed as I felt this would give [place] the better look at the area of concern in the sigmoid colon as well as be better tolerated by the patient.  The patient had severe pain during insertion of the rectal tip.  The barium was instilled into the colon.  This shows a fixed irregular focal stricture/narrowing within the mid sigmoid colon corresponding to the abnormality on CT.  Given the patient's history, I suspect this represents radiation colitis or scarring from radiation.  The remainder of the bladder is unremarkable.  IMPRESSION: Fixed focal irregular stricture in the sigmoid colon as seen on CT, likely related to radiation.  Original Report Authenticated By: Cyndie Chime, M.D.    MEDICATIONS: Scheduled Meds:   . amLODipine  5 mg Oral Daily  . chlorhexidine  15 mL Mouth Rinse BID  . furosemide  60 mg Intravenous Once  . morphine  15 mg Oral Daily  . morphine  30 mg Oral QHS  . polyethylene glycol  17 g Oral BID  . polyethylene glycol-electrolytes  2,000 mL Oral Once   Continuous Infusions:   . sodium chloride 125 mL/hr at 01/31/12 1029  . sodium chloride 50 mL/hr at 01/31/12 1826  . lactated ringers 20 mL/hr at 01/30/12 1254   PRN Meds:.ondansetron (ZOFRAN) IV, ondansetron, oxyCODONE-acetaminophen  Antibiotics: Anti-infectives     Start     Dose/Rate Route Frequency Ordered Stop   01/27/12 2200   cefTRIAXone (ROCEPHIN) 1 g in dextrose 5 % 50 mL IVPB        1 g 100 mL/hr over 30 Minutes Intravenous Every 24 hours 01/27/12 2152 01/29/12 2359   01/27/12 1915   cefTRIAXone (ROCEPHIN) 1 g in dextrose 5 % 50 mL IVPB        1 g 100 mL/hr over 30 Minutes Intravenous  Once 01/27/12 1914 01/27/12 1957          Assessment/Plan: Patient Active Hospital Problem List: Radiation Colitis with sigmoid stricture -Colonoscopy done under Gen Anesthesia on 5/9 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  -Barium Enema done on 5/10 showed a very narrow, sigmoid stricture.  -Evaluated by [facility]-needs colectomy with diversion-will be done early next week -to keep on clear liquids for now  Urinary Tract Infection -received 3 days of Rocephin-which has now been discontinued  Hypercalcemia -chronic issue for patient -await PTH and Vit D levels -now off HCTZ -Monitor Ca levels-off HCTZ-if still high then will give one dose of Bisphosphonate  Chronic Pelvic Pain -2/2 radiation colitis/cervical cancer -contine with current narcotic regimen  Cervical Cancer -follow up with GYN Onc as outpatient-has missed a lot of follow up appointments in the past  Constipation -2/2 narcotics -continue with  miralax  HTN -monitor off HCTZ  Intermittent BRBPR -resolved -likely 2/2 radiation colitis -Colonoscopy incomplete due to sigmoid stricture that could not be traversed, however Barium enema showed no other lesions apart from the sigmoid stricture  Disposition: Remain inpatient  DVT  Prophylaxis: Begin prophylactic heparin  Code Status: Full Code  Maretta Bees,  MD. 02/01/2012, 2:18 PM

## 2012-02-02 ENCOUNTER — Inpatient Hospital Stay (HOSPITAL_COMMUNITY): Payer: Medicaid Other

## 2012-02-02 DIAGNOSIS — R1084 Generalized abdominal pain: Secondary | ICD-10-CM

## 2012-02-02 DIAGNOSIS — N12 Tubulo-interstitial nephritis, not specified as acute or chronic: Secondary | ICD-10-CM

## 2012-02-02 DIAGNOSIS — R112 Nausea with vomiting, unspecified: Secondary | ICD-10-CM

## 2012-02-02 MED ORDER — HEPARIN SODIUM (PORCINE) 5000 UNIT/ML IJ SOLN
5000.0000 [IU] | INTRAMUSCULAR | Status: DC
  Filled 2012-02-02: qty 1

## 2012-02-02 MED ORDER — DEXTROSE 5 % IV SOLN
2.0000 g | INTRAVENOUS | Status: DC
  Filled 2012-02-02: qty 2

## 2012-02-02 MED ORDER — ALVIMOPAN 12 MG PO CAPS
12.0000 mg | ORAL_CAPSULE | ORAL | Status: DC
  Filled 2012-02-02: qty 1

## 2012-02-02 MED ORDER — SIMETHICONE 80 MG PO CHEW
80.0000 mg | CHEWABLE_TABLET | Freq: Four times a day (QID) | ORAL | Status: DC | PRN
  Administered 2012-02-02 – 2012-02-04 (×6): 80 mg via ORAL
  Filled 2012-02-02 (×7): qty 1

## 2012-02-02 MED ORDER — ALVIMOPAN 12 MG PO CAPS: 12.0000 mg | ORAL_CAPSULE | ORAL | Status: DC

## 2012-02-02 MED ORDER — CHLORHEXIDINE GLUCONATE 4 % EX LIQD
1.0000 "application " | Freq: Every day | CUTANEOUS | Status: DC | PRN
  Filled 2012-02-02: qty 15

## 2012-02-02 MED ORDER — SIMETHICONE 80 MG PO CHEW
80.0000 mg | CHEWABLE_TABLET | Freq: Once | ORAL | Status: AC
  Administered 2012-02-02: 80 mg via ORAL
  Filled 2012-02-02: qty 1

## 2012-02-02 MED ORDER — CHLORHEXIDINE GLUCONATE 4 % EX LIQD
1.0000 "application " | Freq: Every evening | CUTANEOUS | Status: DC | PRN
  Administered 2012-02-02 – 2012-02-03 (×2): 1 via TOPICAL
  Filled 2012-02-02 (×2): qty 15

## 2012-02-02 NOTE — Progress Notes (Signed)
Vanessa [name] [id number] 11-25-1975  CARE TEAM:  PCP: Dorrene German, MD, MD  Outpatient Care Team: Patient Care Team: Dorrene German, MD as PCP - General (Internal Medicine)  Inpatient Treatment Team: Treatment Team: Attending Provider: Maretta Bees, MD; Registered Nurse: Mcarthur Rossetti McVeigh, RN; Technician: Sela Hilding, NT; Rounding Team: Simon Rhein, MD; Registered Nurse: Duanne Limerick, RN; Rounding Team: Bishop Limbo, MD; Consulting Physician: Bishop Limbo, MD; Registered Nurse: Hardie Pulley, RN; Technician: Maricela Curet, NT  Subjective:  No events Liquid BMs  Objective:  Vital signs:  Filed Vitals:   02/01/12 1421 02/01/12 2154 02/02/12 0502 02/02/12 0926  BP: 144/92 132/90 113/76 111/74  Pulse: 59 83 71   Temp: 98.8 F (37.1 C) 97.3 F (36.3 C) 98.1 F (36.7 C)   TempSrc: Oral Oral Oral   Resp: 18 16 16    Height:      Weight:      SpO2: 100% 99% 99%     Last BM Date: 02/02/12  Intake/Output   Yesterday:  05/11 0701 - 05/12 0700 In: 2229.2 [P.O.:1080; I.V.:1149.2] Out: -  This shift:     Bowel function:  Flatus: y  BM: y  Physical Exam:  Results:   Labs: Results for orders placed during the hospital encounter of 01/27/12 (from the past 48 hour(s))  BASIC METABOLIC PANEL     Status: Abnormal   Collection Time   02/01/12  7:05 AM      Component Value Range Comment   Sodium 137  135 - 145 (mEq/L)    Potassium 3.5  3.5 - 5.1 (mEq/L)    Chloride 99  96 - 112 (mEq/L)    CO2 26  19 - 32 (mEq/L)    Glucose, Bld 86  70 - 99 (mg/dL)    BUN 4 (*) 6 - 23 (mg/dL)    Creatinine, Ser 4.09  0.50 - 1.10 (mg/dL)    Calcium 81.1 (*) 8.4 - 10.5 (mg/dL)    GFR calc non Af Amer >90  >90 (mL/min)    GFR calc Af Amer >90  >90 (mL/min)     Imaging / Studies: Dg Colon W/cm - Wo/w Kub  01/31/2012  *RADIOLOGY REPORT*  Clinical Data: Rectal bleeding.  Sigmoid wall thickening on the CT. Incomplete colonoscopy.  History of cervical  cancer and pelvic radiation.  SINGLE COLUMN BARIUM ENEMA  Technique:  Initial scout AP supine abdominal image obtained to insure adequate colon cleansing.  Barium was introduced into the colon in a retrograde fashion and refluxed from the rectum to the cecum. Spot images of the colon followed by overhead radiographs were obtained.  Fluoroscopy time: 2.23 minutes.  Comparison:  CT 01/28/2012  Findings:  Single contrast barium enema was performed as I felt this would give [place] the better look at the area of concern in the sigmoid colon as well as be better tolerated by the patient.  The patient had severe pain during insertion of the rectal tip.  The barium was instilled into the colon.  This shows a fixed irregular focal stricture/narrowing within the mid sigmoid colon corresponding to the abnormality on CT.  Given the patient's history, I suspect this represents radiation colitis or scarring from radiation.  The remainder of the bladder is unremarkable.  IMPRESSION: Fixed focal irregular stricture in the sigmoid colon as seen on CT, likely related to radiation.  Original Report Authenticated By: Cyndie Chime, M.D.    Medications / Allergies: per chart  Antibiotics:  Anti-infectives     Start     Dose/Rate Route Frequency Ordered Stop   02/03/12 0000   cefOXitin (MEFOXIN) 2 g in dextrose 5 % 50 mL IVPB        2 g 100 mL/hr over 30 Minutes Intravenous 60 min pre-op 02/02/12 1004     01/27/12 2200   cefTRIAXone (ROCEPHIN) 1 g in dextrose 5 % 50 mL IVPB        1 g 100 mL/hr over 30 Minutes Intravenous Every 24 hours 01/27/12 2152 01/29/12 2359   01/27/12 1915   cefTRIAXone (ROCEPHIN) 1 g in dextrose 5 % 50 mL IVPB        1 g 100 mL/hr over 30 Minutes Intravenous  Once 01/27/12 1914 01/27/12 1957          Problem List:  Principal Problem:  *Sigmoid stricture Active Problems:  Radiation colitis  Cervical cancer  Post-radiation rectal bleeding  Pyelonephritis  Nausea & vomiting   Hypercalcemia   Assessment  Vanessa [name]  36 y.o. female  3 Days Post-Op  Procedure(s): COLONOSCOPY  Sigmoid stricture  Plan:  -slow bowel prep -pt will need sigmoid colectomy to resect strictured area.  Given radiation proctitis, probable loop ileal diversion will be needed to protect the anastomosis healing process vs colostomy.  Dr. [name] to consider surgery 1-2 days depending on prep.  He will discuss w patient soon, tomorrow at the latest.   -Will get Xray to r/o retained barium, etc -IV ABx for pyelonephritis -VTE prophylaxis- SCDs, etc -mobilize as tolerated to help recovery  D/w IM primary team (Dr. Jerral Ralph)  Ardeth Sportsman, M.D., F.A.C.S. Gastrointestinal and Minimally Invasive Surgery [facility] [address] [place], [place] [address] [phone] Main / Paging [phone] Voice Mail   02/02/2012

## 2012-02-02 NOTE — Progress Notes (Signed)
PATIENT DETAILS Name: Vanessa [name] Age: 36 y.o. Sex: female Date of Birth: 09/16/1976 Admit Date: 01/27/2012 AVW:[id number],YNWGN A, MD, MD  Subjective: Some pelvic pain-otherwise no major complaints  Objective: Vital signs in last 24 hours: Filed Vitals:   02/01/12 1421 02/01/12 2154 02/02/12 0502 02/02/12 0926  BP: 144/92 132/90 113/76 111/74  Pulse: 59 83 71   Temp: 98.8 F (37.1 C) 97.3 F (36.3 C) 98.1 F (36.7 C)   TempSrc: Oral Oral Oral   Resp: 18 16 16    Height:      Weight:      SpO2: 100% 99% 99%     Weight change:   Body mass index is 27.78 kg/(m^2).  Intake/Output from previous day:  Intake/Output Summary (Last 24 hours) at 02/02/12 1452 Last data filed at 02/02/12 1100  Gross per 24 hour  Intake 1629.17 ml  Output      0 ml  Net 1629.17 ml    PHYSICAL EXAM: Gen Exam: Awake and alert with clear speech.   Neck: Supple, No JVD.   Chest: B/L Clear.  CVS: S1 S2 Regular, no murmurs.  Abdomen: soft, BS +, mildly  Tender pelvic area, non distended. Extremities: no edema, lower extremities warm to touch. Neurologic: Non Focal.   Skin: No Rash.   Wounds: N/A.    CONSULTS:  GI and general surgery  LAB RESULTS: CBC  Lab 01/31/12 0535 01/30/12 1259 01/29/12 0615 01/28/12 0607 01/27/12 1837 01/26/12 1940  WBC -- -- 10.9* 12.3* 11.5* 12.3*  HGB 10.5* 11.6* 10.8* 10.9* 11.9* --  HCT 31.3* 34.0* 31.7* 31.8* 33.7* --  PLT -- -- 224 229 267 292  MCV -- -- 89.3 89.6 89.4 89.8  MCH -- -- 30.4 30.7 31.6 31.3  MCHC -- -- 34.1 34.3 35.3 34.9  RDW -- -- 13.4 13.7 13.8 13.7  LYMPHSABS -- -- -- -- -- 1.9  MONOABS -- -- -- -- -- 1.2*  EOSABS -- -- -- -- -- 0.1  BASOSABS -- -- -- -- -- 0.0  BANDABS -- -- -- -- -- --    Chemistries   Lab 02/01/12 0705 01/31/12 0535 01/30/12 1259 01/30/12 1011 01/29/12 0615 01/28/12 0607  NA 137 135 139 138 134* --  K 3.5 4.2 3.9 3.8 2.9* --  CL 99 101 -- 101 101 103  CO2 26 24 -- 24 24 23   GLUCOSE 86 87 84 86 115* --   BUN 4* 4* -- 3* 3* <3*  CREATININE 0.61 0.47* -- 0.54 0.52 0.44*  CALCIUM 10.8* 11.1* -- 10.9* 10.4 10.0  MG -- -- -- -- 1.6 --    GFR Estimated Creatinine Clearance: 95.5 ml/min (by C-G formula based on Cr of 0.61).  Coagulation profile No results found for this basename: INR:5,PROTIME:5 in the last 168 hours  Cardiac Enzymes No results found for this basename: CK:3,CKMB:3,TROPONINI:3,MYOGLOBIN:3 in the last 168 hours  No components found with this basename: POCBNP:3 No results found for this basename: DDIMER:2 in the last 72 hours No results found for this basename: HGBA1C:2 in the last 72 hours No results found for this basename: CHOL:2,HDL:2,LDLCALC:2,TRIG:2,CHOLHDL:2,LDLDIRECT:2 in the last 72 hours No results found for this basename: TSH,T4TOTAL,FREET3,T3FREE,THYROIDAB in the last 72 hours No results found for this basename: VITAMINB12:2,FOLATE:2,FERRITIN:2,TIBC:2,IRON:2,RETICCTPCT:2 in the last 72 hours No results found for this basename: LIPASE:2,AMYLASE:2 in the last 72 hours  Urine Studies No results found for this basename: UACOL:2,UAPR:2,USPG:2,UPH:2,UTP:2,UGL:2,UKET:2,UBIL:2,UHGB:2,UNIT:2,UROB:2,ULEU:2,UEPI:2,UWBC:2,URBC:2,UBAC:2,CAST:2,CRYS:2,UCOM:2,BILUA:2 in the last 72 hours  MICROBIOLOGY: Recent Results (from the past 240 hour(s))  URINE CULTURE     Status: Normal   Collection Time   01/26/12  8:39 PM      Component Value Range Status Comment   Specimen Description URINE, RANDOM   Final    Special Requests NONE   Final    Culture  Setup Time [id number]   Final    Colony Count 50,000 COLONIES/ML   Final    Culture     Final    Value: Multiple bacterial morphotypes present, none predominant. Suggest appropriate recollection if clinically indicated.   Report Status 01/28/2012 FINAL   Final   URINE CULTURE     Status: Normal   Collection Time   01/27/12 11:54 PM      Component Value Range Status Comment   Specimen Description URINE, RANDOM   Final    Special  Requests NONE   Final    Culture  Setup Time [id number]   Final    Colony Count NO GROWTH   Final    Culture NO GROWTH   Final    Report Status 01/29/2012 FINAL   Final     RADIOLOGY STUDIES/RESULTS: Ct Abdomen Pelvis W Contrast  01/28/2012  *RADIOLOGY REPORT*  Clinical Data: Left lower quadrant pain with fever.  Nausea vomiting.  CT ABDOMEN AND PELVIS WITH CONTRAST  Technique:  Multidetector CT imaging of the abdomen and pelvis was performed following the standard protocol during bolus administration of intravenous contrast.  Contrast: OMNIPAQUE IOHEXOL 300 MG/ML  SOLN   Comparison: 11/14/2011  Findings: No focal abnormalities seen in the liver or spleen.  The stomach, duodenum, pancreas, and adrenal glands are unremarkable. Gallbladder is surgically absent.  Kidneys have normal imaging features.  No abdominal aortic aneurysm.  There is no free fluid or lymphadenopathy in the abdomen.  Imaging through the pelvis demonstrates a 10-15 cm segment of sigmoid colon shows circumferential irregular wall thickening. There is edema/inflammation in the sigmoid mesocolon and also involving the soft tissues of the pelvic floor.  The cecal tip is in the lower midline anterior pelvis.  The cecum and ascending colon contain a large volume of stool.  There is no stool visualized in the transverse colon although gas is seen in the transverse colon.  The left colon is decompressed.  There is a prominent area of soft tissue attenuation of transition from the dilated stool filled right colon to the gas filled, but none stool containing, transverse colon.  Obstructing mass lesion in the ascending colon distally is a consideration.  Bone windows reveal no worrisome lytic or sclerotic osseous lesions.  IMPRESSION:  10-15 cm segment of abnormal sigmoid colon in the lower central pelvis.  This segment shows irregular sequential wall thickening and adjacent edema/inflammation.  Given the history of cervical cancer and  apparent radiation treatment, post radiation colitis could produce this appearance.  The cecum and proximal/mid ascending colon is dilated and completely stool-filled.  The stool tracks up to a soft tissue focus in the colon and there is no stool visualized in the transverse colon beyond this area of soft tissue attenuation.  Some type of an obstructing lesion in the colon at this location is suspected.  Colonoscopy may prove helpful to further evaluate.  I discussed these findings by telephone with the hospitalist mid- level Maren Reamer) at [place] hours on 01/28/2012.  Original Report Authenticated By: ERIC A. MANSELL, M.D.   Dg Colon W/cm - Wo/w Kub  01/31/2012  *RADIOLOGY REPORT*  Clinical Data: Rectal bleeding.  Sigmoid wall  thickening on the CT. Incomplete colonoscopy.  History of cervical cancer and pelvic radiation.  SINGLE COLUMN BARIUM ENEMA  Technique:  Initial scout AP supine abdominal image obtained to insure adequate colon cleansing.  Barium was introduced into the colon in a retrograde fashion and refluxed from the rectum to the cecum. Spot images of the colon followed by overhead radiographs were obtained.  Fluoroscopy time: 2.23 minutes.  Comparison:  CT 01/28/2012  Findings:  Single contrast barium enema was performed as I felt this would give [place] the better look at the area of concern in the sigmoid colon as well as be better tolerated by the patient.  The patient had severe pain during insertion of the rectal tip.  The barium was instilled into the colon.  This shows a fixed irregular focal stricture/narrowing within the mid sigmoid colon corresponding to the abnormality on CT.  Given the patient's history, I suspect this represents radiation colitis or scarring from radiation.  The remainder of the bladder is unremarkable.  IMPRESSION: Fixed focal irregular stricture in the sigmoid colon as seen on CT, likely related to radiation.  Original Report Authenticated By: Cyndie Chime, M.D.     MEDICATIONS: Scheduled Meds:    . alvimopan  12 mg Oral 60 min Pre-Op  . amLODipine  5 mg Oral Daily  . cefOXitin  2 g Intravenous 60 min Pre-Op  . chlorhexidine  15 mL Mouth Rinse BID  . heparin  5,000 Units Subcutaneous 90 min Pre-Op  . heparin subcutaneous  5,000 Units Subcutaneous Q8H  . morphine  15 mg Oral Daily  . morphine  30 mg Oral QHS  . polyethylene glycol  17 g Oral BID  . simethicone  80 mg Oral Once  . DISCONTD: alvimopan  12 mg Oral 60 min Pre-Op  . DISCONTD: cefOXitin  2 g Intravenous 60 min Pre-Op   Continuous Infusions:    . sodium chloride 50 mL/hr at 01/31/12 1826  . DISCONTD: lactated ringers 20 mL/hr at 01/30/12 1254   PRN Meds:.chlorhexidine, chlorhexidine, ondansetron (ZOFRAN) IV, ondansetron, oxyCODONE-acetaminophen, simethicone  Antibiotics: Anti-infectives     Start     Dose/Rate Route Frequency Ordered Stop   02/03/12 0500   cefOXitin (MEFOXIN) 2 g in dextrose 5 % 50 mL IVPB        2 g 100 mL/hr over 30 Minutes Intravenous 60 min pre-op 02/02/12 1156     02/03/12 0000   cefOXitin (MEFOXIN) 2 g in dextrose 5 % 50 mL IVPB  Status:  Discontinued        2 g 100 mL/hr over 30 Minutes Intravenous 60 min pre-op 02/02/12 1004 02/02/12 1156   01/27/12 2200   cefTRIAXone (ROCEPHIN) 1 g in dextrose 5 % 50 mL IVPB        1 g 100 mL/hr over 30 Minutes Intravenous Every 24 hours 01/27/12 2152 01/29/12 2359   01/27/12 1915   cefTRIAXone (ROCEPHIN) 1 g in dextrose 5 % 50 mL IVPB        1 g 100 mL/hr over 30 Minutes Intravenous  Once 01/27/12 1914 01/27/12 1957          Assessment/Plan: Patient Active Hospital Problem List: Radiation Colitis with sigmoid stricture -Colonoscopy done under Gen Anesthesia on 5/9 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  -Barium Enema done on 5/10 showed a very narrow, sigmoid stricture.  -Evaluated by [facility]-needs colectomy with diversion-possibly will be done  5/13 or 5/14 -  to keep on clear liquids for now  Urinary Tract Infection -received 3 days of Rocephin-which has now been discontinued  Hypercalcemia -chronic issue for patient -await PTH and Vit D levels-still pending -now off HCTZ -Monitor Ca levels-off HCTZ-if still high then will give one dose of Bisphosphonate  Chronic Pelvic Pain -2/2 radiation colitis/cervical cancer -contine with current narcotic regimen  Cervical Cancer -follow up with GYN Onc as outpatient-has missed a lot of follow up appointments in the past  Constipation -2/2 narcotics -continue with miralax  HTN -monitor off HCTZ-BP controlled with amlodipine currently  Intermittent BRBPR -resolved -likely 2/2 radiation colitis -Colonoscopy incomplete due to sigmoid stricture that could not be traversed, however Barium enema showed no other lesions apart from the sigmoid stricture  Disposition: Remain inpatient  DVT Prophylaxis: Begin prophylactic heparin  Code Status: Full Code  Maretta Bees,  MD. 02/02/2012, 2:52 PM

## 2012-02-02 NOTE — Progress Notes (Signed)
Pt's BP is elevated this afternoon, 153/100 and 144/99. Pt currently on norvasc 5mg  daily. Pt's home med HCTZ 12.5mg  on hold. Dr. Jerral Ralph text paged to notify. Julien Nordmann [facility]

## 2012-02-03 DIAGNOSIS — N12 Tubulo-interstitial nephritis, not specified as acute or chronic: Secondary | ICD-10-CM

## 2012-02-03 DIAGNOSIS — R1084 Generalized abdominal pain: Secondary | ICD-10-CM

## 2012-02-03 DIAGNOSIS — R112 Nausea with vomiting, unspecified: Secondary | ICD-10-CM

## 2012-02-03 LAB — PTH, INTACT AND CALCIUM
Calcium, Total (PTH): 10.7 mg/dL — ABNORMAL HIGH (ref 8.4–10.5)
PTH: 53.2 pg/mL (ref 14.0–72.0)

## 2012-02-03 LAB — SURGICAL PCR SCREEN
MRSA, PCR: NEGATIVE
Staphylococcus aureus: NEGATIVE

## 2012-02-03 MED ORDER — HEPARIN SODIUM (PORCINE) 5000 UNIT/ML IJ SOLN
5000.0000 [IU] | INTRAMUSCULAR | Status: AC
  Administered 2012-02-04: 5000 [IU] via SUBCUTANEOUS
  Filled 2012-02-03: qty 1

## 2012-02-03 MED ORDER — ZOLPIDEM TARTRATE 5 MG PO TABS
5.0000 mg | ORAL_TABLET | Freq: Every evening | ORAL | Status: AC | PRN
  Administered 2012-02-03 – 2012-02-09 (×3): 5 mg via ORAL
  Filled 2012-02-03 (×3): qty 1

## 2012-02-03 MED ORDER — HYDROMORPHONE HCL PF 1 MG/ML IJ SOLN
0.5000 mg | INTRAMUSCULAR | Status: DC | PRN
  Administered 2012-02-03 – 2012-02-04 (×6): 0.5 mg via INTRAVENOUS
  Filled 2012-02-03 (×6): qty 1

## 2012-02-03 MED ORDER — DEXTROSE 5 % IV SOLN
2.0000 g | INTRAVENOUS | Status: AC
  Administered 2012-02-04: 2 g via INTRAVENOUS
  Filled 2012-02-03 (×2): qty 2

## 2012-02-03 MED ORDER — AMLODIPINE BESYLATE 10 MG PO TABS
10.0000 mg | ORAL_TABLET | Freq: Every day | ORAL | Status: DC
  Administered 2012-02-05 – 2012-02-19 (×15): 10 mg via ORAL
  Filled 2012-02-03 (×16): qty 1

## 2012-02-03 MED ORDER — HEPARIN SODIUM (PORCINE) 5000 UNIT/ML IJ SOLN
5000.0000 [IU] | INTRAMUSCULAR | Status: DC
  Filled 2012-02-03: qty 1

## 2012-02-03 MED ORDER — HEPARIN SODIUM (PORCINE) 5000 UNIT/ML IJ SOLN
5000.0000 [IU] | Freq: Three times a day (TID) | INTRAMUSCULAR | Status: DC
  Administered 2012-02-03: 5000 [IU] via SUBCUTANEOUS
  Filled 2012-02-03 (×5): qty 1

## 2012-02-03 MED ORDER — ALVIMOPAN 12 MG PO CAPS
12.0000 mg | ORAL_CAPSULE | ORAL | Status: AC
  Administered 2012-02-04: 12 mg via ORAL
  Filled 2012-02-03 (×2): qty 1

## 2012-02-03 MED ORDER — HYDROMORPHONE HCL PF 1 MG/ML IJ SOLN: 0.5000 mg | INTRAMUSCULAR | Status: DC | PRN

## 2012-02-03 NOTE — Progress Notes (Signed)
Per fall protocol, pt is a moderate fall risk and the bed alarm is required unless pt refuses.  Discussed the purpose of bed alarm with pt, but pt refused.

## 2012-02-03 NOTE — Progress Notes (Signed)
INITIAL ADULT NUTRITION ASSESSMENT Date: 02/03/2012   Time: 1:02 PM Reason for Assessment: NPO/CL x 7 days  ASSESSMENT: Female 36 y.o.  Dx: Sigmoid stricture  Hx:  Past Medical History  Diagnosis Date  . Hypertension   . Cervical cancer     Related Meds:     . alvimopan  12 mg Oral 60 min Pre-Op  . amLODipine  10 mg Oral Daily  . cefOXitin  2 g Intravenous 60 min Pre-Op  . chlorhexidine  15 mL Mouth Rinse BID  . heparin subcutaneous  5,000 Units Subcutaneous Q8H  . heparin  5,000 Units Subcutaneous 90 min Pre-Op  . morphine  15 mg Oral Daily  . morphine  30 mg Oral QHS  . polyethylene glycol  17 g Oral BID  . DISCONTD: alvimopan  12 mg Oral 60 min Pre-Op  . DISCONTD: amLODipine  5 mg Oral Daily  . DISCONTD: cefOXitin  2 g Intravenous 60 min Pre-Op  . DISCONTD: heparin  5,000 Units Subcutaneous 90 min Pre-Op  . DISCONTD: heparin  5,000 Units Subcutaneous 90 min Pre-Op     Ht: 5\' 4"  (162.6 cm)  Wt: 161 lb 13.1 oz (73.4 kg)  Ideal Wt: 54.5 kg % Ideal Wt: 135%  Usual Wt:  Wt Readings from Last 10 Encounters:  02/01/12 161 lb 13.1 oz (73.4 kg)  02/01/12 161 lb 13.1 oz (73.4 kg)  02/01/12 161 lb 13.1 oz (73.4 kg)  01/07/12 158 lb 14.4 oz (72.077 kg)    % Usual Wt: 102%  Body mass index is 27.78 kg/(m^2). Pt is overweight  Food/Nutrition Related Hx:  Pt states that appetite and weight were normal PTA.   Labs:  CMP     Component Value Date/Time   NA 137 02/01/2012 0705   K 3.5 02/01/2012 0705   CL 99 02/01/2012 0705   CO2 26 02/01/2012 0705   GLUCOSE 86 02/01/2012 0705   BUN 4* 02/01/2012 0705   CREATININE 0.61 02/01/2012 0705   CALCIUM 10.8* 02/01/2012 0705   PROT 7.1 01/31/2012 0535   ALBUMIN 3.2* 01/31/2012 0535   AST 14 01/31/2012 0535   ALT 10 01/31/2012 0535   ALKPHOS 108 01/31/2012 0535   BILITOT 0.3 01/31/2012 0535   GFRNONAA >90 02/01/2012 0705   GFRAA >90 02/01/2012 0705    Intake/Output Summary (Last 24 hours) at 02/03/12 1305 Last data filed at  02/03/12 0900  Gross per 24 hour  Intake 3306.87 ml  Output    300 ml  Net 3006.87 ml     Diet Order: Clear Liquid, then NPO after MN  Supplements/Tube Feeding: none  IVF:    sodium chloride Last Rate: 50 mL/hr at 02/02/12 2118    Estimated Nutritional Needs:   Kcal: 1800-2000 Protein: 75-85 gm  Fluid:  1.8-2 L   Pt with hx of cervical CA, s/p treatment with wosening abdominal pain. Found to have sigmoid stricture to go to OR tomorrow for colectomy, anastomosis, and likely diverting loop ileostomy. RD pulled to chart as pt has been NPO or CL since admission, now day 7. RD anticipates that pt will remain NPO after surgery. Recommend that if pt is anticipated to remain NPO or CL > 8 days total, initiate nutrition support.   Pt also has not been weighed since 5/11, recommend daily weights on this pt to assess for weight loss. Pt weight has increased since admission, but since pt has had little to no PO intake weight is likely related to fluids, net +  for this admission.   NUTRITION DIAGNOSIS: -Inadequate oral intake (NI-2.1).  Status: Ongoing  RELATED TO: inability to eat  AS EVIDENCE BY: NPO/CL x 7 days  MONITORING/EVALUATION(Goals): Goal: diet will advance or Nutrition support will be initiated.  Monitor: PO intake, diet advance, surgery, weight, labs, I/O's  EDUCATION NEEDS: -No education needs identified at this time  INTERVENTION: 1. Recommend that is pt diet will not be advanced soon after surgery, initiate nutrition support 2. RD will continue to follow  Dietitian [phone]  DOCUMENTATION CODES Per approved criteria  -Not Applicable    Vanessa [name] 02/03/2012, 1:02 PM

## 2012-02-03 NOTE — Progress Notes (Signed)
Patient ID: CORINN STOLTZFUS, female   DOB: 1976/08/26, 36 y.o.   MRN: [id number]   PATIENT DETAILS Name: Vanessa [name] Age: 36 y.o. Sex: female Date of Birth: 1975-11-23 Admit Date: 01/27/2012 [id number] A, MD, MD  Subjective: No complaints.  Still having bowel movements with prep.  Objective: Vital signs in last 24 hours: Filed Vitals:   02/02/12 1500 02/02/12 1847 02/02/12 2152 02/03/12 0522  BP: 153/100 144/99 146/87 157/91  Pulse: 68 62 55 57  Temp: 98.1 F (36.7 C)  98 F (36.7 C) 98.1 F (36.7 C)  TempSrc: Oral  Oral Oral  Resp: 18  17 16   Height:      Weight:      SpO2: 97%  99% 100%    Weight change:   Body mass index is 27.78 kg/(m^2).  Intake/Output from previous day:  Intake/Output Summary (Last 24 hours) at 02/03/12 1133 Last data filed at 02/03/12 0900  Gross per 24 hour  Intake 3306.87 ml  Output    300 ml  Net 3006.87 ml    PHYSICAL EXAM: Gen Exam: Awake and alert with clear speech.    Chest: B/L Clear. No w/c/r CVS: S1 S2 Regular, no murmurs.  Abdomen: soft, BS +, mildly  Tender pelvic area, non distended. Extremities: no edema, lower extremities warm to touch. Neurologic: Non Focal.   Skin: No Rash.   Wounds: N/A.    CONSULTS:  GI and general surgery  LAB RESULTS: CBC  Lab 01/31/12 0535 01/30/12 1259 01/29/12 0615 01/28/12 0607 01/27/12 1837  WBC -- -- 10.9* 12.3* 11.5*  HGB 10.5* 11.6* 10.8* 10.9* 11.9*  HCT 31.3* 34.0* 31.7* 31.8* 33.7*  PLT -- -- 224 229 267  MCV -- -- 89.3 89.6 89.4  MCH -- -- 30.4 30.7 31.6  MCHC -- -- 34.1 34.3 35.3  RDW -- -- 13.4 13.7 13.8  LYMPHSABS -- -- -- -- --  MONOABS -- -- -- -- --  EOSABS -- -- -- -- --  BASOSABS -- -- -- -- --  BANDABS -- -- -- -- --    Chemistries   Lab 02/01/12 0705 01/31/12 0535 01/30/12 1259 01/30/12 1011 01/29/12 0615 01/28/12 0607  NA 137 135 139 138 134* --  K 3.5 4.2 3.9 3.8 2.9* --  CL 99 101 -- 101 101 103  CO2 26 24 -- 24 24 23   GLUCOSE 86 87 84  86 115* --  BUN 4* 4* -- 3* 3* <3*  CREATININE 0.61 0.47* -- 0.54 0.52 0.44*  CALCIUM 10.8* 11.1* -- 10.9* 10.4 10.0  MG -- -- -- -- 1.6 --    MICROBIOLOGY: Recent Results (from the past 240 hour(s))  URINE CULTURE     Status: Normal   Collection Time   01/26/12  8:39 PM      Component Value Range Status Comment   Specimen Description URINE, RANDOM   Final    Special Requests NONE   Final    Culture  Setup Time [id number]   Final    Colony Count 50,000 COLONIES/ML   Final    Culture     Final    Value: Multiple bacterial morphotypes present, none predominant. Suggest appropriate recollection if clinically indicated.   Report Status 01/28/2012 FINAL   Final   URINE CULTURE     Status: Normal   Collection Time   01/27/12 11:54 PM      Component Value Range Status Comment   Specimen Description URINE, RANDOM   Final  Special Requests NONE   Final    Culture  Setup Time [id number]   Final    Colony Count NO GROWTH   Final    Culture NO GROWTH   Final    Report Status 01/29/2012 FINAL   Final   SURGICAL PCR SCREEN     Status: Normal   Collection Time   02/03/12  2:28 AM      Component Value Range Status Comment   MRSA, PCR NEGATIVE  NEGATIVE  Final    Staphylococcus aureus NEGATIVE  NEGATIVE  Final     RADIOLOGY STUDIES/RESULTS: Ct Abdomen Pelvis W Contrast  01/28/2012  *RADIOLOGY REPORT*  Clinical Data: Left lower quadrant pain with fever.  Nausea vomiting.  CT ABDOMEN AND PELVIS WITH CONTRAST  Technique:  Multidetector CT imaging of the abdomen and pelvis was performed following the standard protocol during bolus administration of intravenous contrast.  Contrast: OMNIPAQUE IOHEXOL 300 MG/ML  SOLN   Comparison: 11/14/2011  Findings: No focal abnormalities seen in the liver or spleen.  The stomach, duodenum, pancreas, and adrenal glands are unremarkable. Gallbladder is surgically absent.  Kidneys have normal imaging features.  No abdominal aortic aneurysm.  There is no free  fluid or lymphadenopathy in the abdomen.  Imaging through the pelvis demonstrates a 10-15 cm segment of sigmoid colon shows circumferential irregular wall thickening. There is edema/inflammation in the sigmoid mesocolon and also involving the soft tissues of the pelvic floor.  The cecal tip is in the lower midline anterior pelvis.  The cecum and ascending colon contain a large volume of stool.  There is no stool visualized in the transverse colon although gas is seen in the transverse colon.  The left colon is decompressed.  There is a prominent area of soft tissue attenuation of transition from the dilated stool filled right colon to the gas filled, but none stool containing, transverse colon.  Obstructing mass lesion in the ascending colon distally is a consideration.  Bone windows reveal no worrisome lytic or sclerotic osseous lesions.  IMPRESSION:  10-15 cm segment of abnormal sigmoid colon in the lower central pelvis.  This segment shows irregular sequential wall thickening and adjacent edema/inflammation.  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  The cecum and proximal/mid ascending colon is dilated and completely stool-filled.  The stool tracks up to a soft tissue focus in the colon and there is no stool visualized in the transverse colon beyond this area of soft tissue attenuation.  Some type of an obstructing lesion in the colon at this location is suspected.  Colonoscopy may prove helpful to further evaluate.  I discussed these findings by telephone with the hospitalist mid- level Maren Reamer) at [place] hours on 01/28/2012.  Original Report Authenticated By: ERIC A. MANSELL, M.D.   Dg Colon W/cm - Wo/w Kub  01/31/2012  *RADIOLOGY REPORT*  Clinical Data: Rectal bleeding.  Sigmoid wall thickening on the CT. Incomplete colonoscopy.  History of cervical cancer and pelvic radiation.  SINGLE COLUMN BARIUM ENEMA  Technique:   Initial scout AP supine abdominal image obtained to insure adequate colon cleansing.  Barium was introduced into the colon in a retrograde fashion and refluxed from the rectum to the cecum. Spot images of the colon followed by overhead radiographs were obtained.  Fluoroscopy time: 2.23 minutes.  Comparison:  CT 01/28/2012  Findings:  Single contrast barium enema was performed as I felt this would give [place] the better  look at the area of concern in the sigmoid colon as well as be better tolerated by the patient.  The patient had severe pain during insertion of the rectal tip.  The barium was instilled into the colon.  This shows a fixed irregular focal stricture/narrowing within the mid sigmoid colon corresponding to the abnormality on CT.  Given the patient's history, I suspect this represents radiation colitis or scarring from radiation.  The remainder of the bladder is unremarkable.  IMPRESSION: Fixed focal irregular stricture in the sigmoid colon as seen on CT, likely related to radiation.  Original Report Authenticated By: Cyndie Chime, M.D.    MEDICATIONS: Scheduled Meds:    . alvimopan  12 mg Oral 60 min Pre-Op  . amLODipine  10 mg Oral Daily  . cefOXitin  2 g Intravenous 60 min Pre-Op  . chlorhexidine  15 mL Mouth Rinse BID  . heparin subcutaneous  5,000 Units Subcutaneous Q8H  . heparin  5,000 Units Subcutaneous 90 min Pre-Op  . morphine  15 mg Oral Daily  . morphine  30 mg Oral QHS  . polyethylene glycol  17 g Oral BID  . DISCONTD: alvimopan  12 mg Oral 60 min Pre-Op  . DISCONTD: alvimopan  12 mg Oral 60 min Pre-Op  . DISCONTD: amLODipine  5 mg Oral Daily  . DISCONTD: cefOXitin  2 g Intravenous 60 min Pre-Op  . DISCONTD: cefOXitin  2 g Intravenous 60 min Pre-Op  . DISCONTD: heparin  5,000 Units Subcutaneous 90 min Pre-Op  . DISCONTD: heparin  5,000 Units Subcutaneous 90 min Pre-Op   Continuous Infusions:    . sodium chloride 50 mL/hr at 02/02/12 2118   PRN Meds:.chlorhexidine,  chlorhexidine, ondansetron (ZOFRAN) IV, ondansetron, oxyCODONE-acetaminophen, simethicone  Antibiotics: Anti-infectives     Start     Dose/Rate Route Frequency Ordered Stop   02/04/12 0000   cefOXitin (MEFOXIN) 2 g in dextrose 5 % 50 mL IVPB        2 g 100 mL/hr over 30 Minutes Intravenous 60 min pre-op 02/03/12 0333     02/03/12 0500   cefOXitin (MEFOXIN) 2 g in dextrose 5 % 50 mL IVPB  Status:  Discontinued        2 g 100 mL/hr over 30 Minutes Intravenous 60 min pre-op 02/02/12 1156 02/03/12 0333   02/03/12 0000   cefOXitin (MEFOXIN) 2 g in dextrose 5 % 50 mL IVPB  Status:  Discontinued        2 g 100 mL/hr over 30 Minutes Intravenous 60 min pre-op 02/02/12 1004 02/02/12 1156   01/27/12 2200   cefTRIAXone (ROCEPHIN) 1 g in dextrose 5 % 50 mL IVPB        1 g 100 mL/hr over 30 Minutes Intravenous Every 24 hours 01/27/12 2152 01/29/12 2359   01/27/12 1915   cefTRIAXone (ROCEPHIN) 1 g in dextrose 5 % 50 mL IVPB        1 g 100 mL/hr over 30 Minutes Intravenous  Once 01/27/12 1914 01/27/12 1957          Assessment/Plan: Patient Active Hospital Problem List: Radiation Colitis with sigmoid stricture -Colonoscopy done under Gen Anesthesia on 5/9 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  -Barium Enema done on 5/10 showed a very narrow, sigmoid stricture.  -Evaluated by [facility]-needs colectomy with diversion.  Scheduled for 5/14 -to keep on clear liquids for now  Urinary Tract Infection -received 3 days of Rocephin-which has  now been discontinued  Hypercalcemia -chronic issue for patient -await PTH and Vit D levels-still pending -now off HCTZ -Monitor Ca levels-off HCTZ-if still high then will give one dose of Bisphosphonate  Chronic Pelvic Pain -2/2 radiation colitis/cervical cancer and chronic constipation from stricture. -contine with current narcotic regimen  Cervical Cancer -follow up with GYN Onc as outpatient-has  missed a lot of follow up appointments in the past  Constipation -2/2 narcotics and stricture. -continue with miralax  HTN -monitor off HCTZ-BP controlled with amlodipine currently  Intermittent BRBPR -resolved -likely 2/2 radiation colitis -Colonoscopy incomplete due to sigmoid stricture that could not be traversed, however Barium enema showed no other lesions apart from the sigmoid stricture  Disposition: Remain inpatient  DVT Prophylaxis: Begin prophylactic heparin  Code Status: Full Code  Romualdo Bolk [facility] Pager: [phone]  02/03/2012, 11:33 AM   Attending -I have seen and examined the patient, I agree with assessment and plan. She is scheduled to go to the OR tomorrow.  Dr Windell [place]

## 2012-02-03 NOTE — Progress Notes (Signed)
The patient will need to go to surgery tomorrow AM for colectomy, anastomosis, and likely diverting loop ileostomy.  Patient understands and will be taught appropriate care for ileostomy.  Marta Lamas. [name], MD, FACS [phone] [facility]

## 2012-02-03 NOTE — Progress Notes (Signed)
Patient ID: Vanessa [name], female   DOB: Sep 03, 1976, 36 y.o.   MRN: [id number] 4 Days Post-Op  Subjective: Pt feels ok.  C/o some mild left sided abdominal pain.  No nausea or vomiting.  Tolerating clears without any problems.  Objective: Vital signs in last 24 hours: Temp:  [98 F (36.7 C)-98.1 F (36.7 C)] 98.1 F (36.7 C) (05/13 0522) Pulse Rate:  [55-68] 57  (05/13 0522) Resp:  [16-18] 16  (05/13 0522) BP: (111-157)/(74-100) 157/91 mmHg (05/13 0522) SpO2:  [97 %-100 %] 100 % (05/13 0522) Last BM Date: 02/02/12  Intake/Output from previous day: 05/12 0701 - 05/13 0700 In: 3306.9 [P.O.:480; I.V.:2826.9] Out: 300 [Urine:300] Intake/Output this shift:    PE: Abd: soft, mild left sided tenderness, ND, +BS Heart: regular Lungs: CTAB  Lab Results:  No results found for this basename: WBC:2,HGB:2,HCT:2,PLT:2 in the last 72 hours BMET  [facility] 02/01/12 0705  NA 137  K 3.5  CL 99  CO2 26  GLUCOSE 86  BUN 4*  CREATININE 0.61  CALCIUM 10.8*   PT/INR No results found for this basename: LABPROT:2,INR:2 in the last 72 hours CMP     Component Value Date/Time   NA 137 02/01/2012 0705   K 3.5 02/01/2012 0705   CL 99 02/01/2012 0705   CO2 26 02/01/2012 0705   GLUCOSE 86 02/01/2012 0705   BUN 4* 02/01/2012 0705   CREATININE 0.61 02/01/2012 0705   CALCIUM 10.8* 02/01/2012 0705   PROT 7.1 01/31/2012 0535   ALBUMIN 3.2* 01/31/2012 0535   AST 14 01/31/2012 0535   ALT 10 01/31/2012 0535   ALKPHOS 108 01/31/2012 0535   BILITOT 0.3 01/31/2012 0535   GFRNONAA >90 02/01/2012 0705   GFRAA >90 02/01/2012 0705   Lipase     Component Value Date/Time   LIPASE 16 11/13/2011 1007       Studies/Results: Dg Abd Acute W/chest  02/02/2012  *RADIOLOGY REPORT*  Clinical Data: Abdominal pain.  Perforation.  Small bowel obstruction.  Improving abdominal pain and tenderness.  ACUTE ABDOMEN SERIES (ABDOMEN 2 VIEW & CHEST 1 VIEW)  Comparison: 01/31/2012.  Findings: Residual contrast is present in  the colon. Cardiopericardial silhouette within normal limits. Mediastinal contours normal. Trachea midline.  No airspace disease or effusion. No free air underneath the hemidiaphragms. Cholecystectomy clips are present in the right upper quadrant.  Fluid levels are present in the colon.  On the upright radiograph, there is narrowing of the junction of the descending colon and sigmoid colon which on the supine radiographs resolves, likely representing peristaltic contraction.  There is however, fixed narrowing of the sigmoid colon with irregular contours.  The narrowing is circumferential. This has been previously described on CT and barium enema.  There has been distal progression of contrast from the colon so the obstruction is not complete.  IMPRESSION: High-grade sigmoid stricture, described on prior exams.  Gaseous distention of the proximal colon indicating some degree of obstruction however some of the contrast administered in conjunction with prior enema has passed.  Original Report Authenticated By: Andreas Newport, M.D.    Anti-infectives: Anti-infectives     Start     Dose/Rate Route Frequency Ordered Stop   02/04/12 0000   cefOXitin (MEFOXIN) 2 g in dextrose 5 % 50 mL IVPB        2 g 100 mL/hr over 30 Minutes Intravenous 60 min pre-op 02/03/12 0333     02/03/12 0500   cefOXitin (MEFOXIN) 2 g in dextrose 5 % 50  mL IVPB  Status:  Discontinued        2 g 100 mL/hr over 30 Minutes Intravenous 60 min pre-op 02/02/12 1156 02/03/12 0333   02/03/12 0000   cefOXitin (MEFOXIN) 2 g in dextrose 5 % 50 mL IVPB  Status:  Discontinued        2 g 100 mL/hr over 30 Minutes Intravenous 60 min pre-op 02/02/12 1004 02/02/12 1156   01/27/12 2200   cefTRIAXone (ROCEPHIN) 1 g in dextrose 5 % 50 mL IVPB        1 g 100 mL/hr over 30 Minutes Intravenous Every 24 hours 01/27/12 2152 01/29/12 2359   01/27/12 1915   cefTRIAXone (ROCEPHIN) 1 g in dextrose 5 % 50 mL IVPB        1 g 100 mL/hr over 30 Minutes  Intravenous  Once 01/27/12 1914 01/27/12 1957           Assessment/Plan  1. 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  Plan: 1. For OR tomorrow for sigmoid colectomy and diverting ileostomy.  Cont clear liquids today and NPO after MN. 2. All pre-op orders have been adjusted to tomorrow.   LOS: 7 days    Shevon Sian E 02/03/2012

## 2012-02-03 NOTE — Plan of Care (Signed)
Problem: Phase I Progression Outcomes Goal: Initial discharge plan identified Outcome: Completed/Met Date Met:  02/03/12 Return home when cleared  Problem: Phase II Progression Outcomes Goal: Progress activity as tolerated unless otherwise ordered Outcome: Completed/Met Date Met:  02/03/12 Ambulating as tolerated

## 2012-02-04 ENCOUNTER — Inpatient Hospital Stay (HOSPITAL_COMMUNITY): Payer: Medicaid Other | Admitting: Anesthesiology

## 2012-02-04 ENCOUNTER — Encounter (HOSPITAL_COMMUNITY): Payer: Self-pay | Admitting: Anesthesiology

## 2012-02-04 ENCOUNTER — Encounter (HOSPITAL_COMMUNITY): Admission: EM | Disposition: A | Discharge status: Home Health | Payer: Self-pay | Source: Home / Self Care

## 2012-02-04 DIAGNOSIS — R1084 Generalized abdominal pain: Secondary | ICD-10-CM

## 2012-02-04 DIAGNOSIS — R112 Nausea with vomiting, unspecified: Secondary | ICD-10-CM

## 2012-02-04 DIAGNOSIS — N12 Tubulo-interstitial nephritis, not specified as acute or chronic: Secondary | ICD-10-CM

## 2012-02-04 HISTORY — PX: COLOSTOMY REVISION: SHX5232

## 2012-02-04 LAB — BASIC METABOLIC PANEL
BUN: 3 mg/dL — ABNORMAL LOW (ref 6–23)
Chloride: 103 mEq/L (ref 96–112)
Creatinine, Ser: 0.63 mg/dL (ref 0.50–1.10)
GFR calc Af Amer: >90 mL/min (ref >90)
GFR calc non Af Amer: >90 mL/min (ref >90)
Potassium: 4.3 mEq/L (ref 3.5–5.1)

## 2012-02-04 LAB — SURGICAL PCR SCREEN
MRSA, PCR: NEGATIVE
Staphylococcus aureus: NEGATIVE

## 2012-02-04 SURGERY — COLECTOMY, SIGMOID, OPEN
Anesthesia: General

## 2012-02-04 SURGERY — COLECTOMY, SIGMOID, OPEN
Anesthesia: General | Site: Abdomen | Wound class: Clean Contaminated

## 2012-02-04 MED ORDER — DROPERIDOL 2.5 MG/ML IJ SOLN: 0.6250 mg | INTRAMUSCULAR | Status: DC | PRN

## 2012-02-04 MED ORDER — ENOXAPARIN SODIUM 40 MG/0.4ML ~~LOC~~ SOLN
40.0000 mg | SUBCUTANEOUS | Status: DC
  Administered 2012-02-05 – 2012-02-18 (×14): 40 mg via SUBCUTANEOUS
  Filled 2012-02-04 (×18): qty 0.4

## 2012-02-04 MED ORDER — DEXTROSE 5 % IV SOLN
1.0000 g | Freq: Four times a day (QID) | INTRAVENOUS | Status: AC
  Administered 2012-02-04 – 2012-02-05 (×3): 1 g via INTRAVENOUS
  Filled 2012-02-04 (×3): qty 1

## 2012-02-04 MED ORDER — LIDOCAINE HCL (CARDIAC) 20 MG/ML IV SOLN
INTRAVENOUS | Status: DC | PRN
  Administered 2012-02-04: 100 mg via INTRAVENOUS

## 2012-02-04 MED ORDER — SODIUM CHLORIDE 0.9 % IJ SOLN: 9.0000 mL | INTRAMUSCULAR | Status: DC | PRN

## 2012-02-04 MED ORDER — ONDANSETRON HCL 4 MG/2ML IJ SOLN
INTRAMUSCULAR | Status: DC | PRN
  Administered 2012-02-04: 4 mg via INTRAVENOUS

## 2012-02-04 MED ORDER — NALOXONE HCL 0.4 MG/ML IJ SOLN: 0.4000 mg | INTRAMUSCULAR | Status: DC | PRN

## 2012-02-04 MED ORDER — LACTATED RINGERS IV SOLN
INTRAVENOUS | Status: DC | PRN
  Administered 2012-02-04 (×3): via INTRAVENOUS

## 2012-02-04 MED ORDER — ROCURONIUM BROMIDE 100 MG/10ML IV SOLN
INTRAVENOUS | Status: DC | PRN
  Administered 2012-02-04: 20 mg via INTRAVENOUS
  Administered 2012-02-04: 50 mg via INTRAVENOUS

## 2012-02-04 MED ORDER — GLYCOPYRROLATE 0.2 MG/ML IJ SOLN
INTRAMUSCULAR | Status: DC | PRN
  Administered 2012-02-04: .4 mg via INTRAVENOUS

## 2012-02-04 MED ORDER — PROPOFOL 10 MG/ML IV EMUL
INTRAVENOUS | Status: DC | PRN
  Administered 2012-02-04: 150 mg via INTRAVENOUS

## 2012-02-04 MED ORDER — HYDROMORPHONE HCL PF 1 MG/ML IJ SOLN
INTRAMUSCULAR | Status: AC
  Administered 2012-02-04: 0.5 mg via INTRAVENOUS
  Filled 2012-02-04: qty 1

## 2012-02-04 MED ORDER — FENTANYL CITRATE 0.05 MG/ML IJ SOLN
INTRAMUSCULAR | Status: DC | PRN
  Administered 2012-02-04 (×3): 50 ug via INTRAVENOUS
  Administered 2012-02-04: 100 ug via INTRAVENOUS
  Administered 2012-02-04 (×5): 50 ug via INTRAVENOUS

## 2012-02-04 MED ORDER — POVIDONE-IODINE 10 % EX OINT
TOPICAL_OINTMENT | CUTANEOUS | Status: DC | PRN
  Administered 2012-02-04: 1 via TOPICAL

## 2012-02-04 MED ORDER — ONDANSETRON HCL 4 MG/2ML IJ SOLN: 4.0000 mg | Freq: Four times a day (QID) | INTRAMUSCULAR | Status: DC | PRN

## 2012-02-04 MED ORDER — HYDROMORPHONE 0.3 MG/ML IV SOLN
INTRAVENOUS | Status: DC
  Administered 2012-02-04: 4.8 mg via INTRAVENOUS
  Administered 2012-02-04: 15:00:00 via INTRAVENOUS
  Administered 2012-02-04: 3.9 mg via INTRAVENOUS
  Administered 2012-02-04 – 2012-02-05 (×2): via INTRAVENOUS
  Administered 2012-02-05: 4.2 mg via INTRAVENOUS
  Administered 2012-02-05: 1.2 mg via INTRAVENOUS
  Administered 2012-02-05: 12:00:00 via INTRAVENOUS
  Administered 2012-02-05: 6 mg via INTRAVENOUS
  Administered 2012-02-05: 5.1 mg via INTRAVENOUS
  Administered 2012-02-05: 04:00:00 via INTRAVENOUS
  Administered 2012-02-06: 1.2 mg via INTRAVENOUS
  Administered 2012-02-06 (×2): 2.1 mg via INTRAVENOUS
  Administered 2012-02-06: 2.4 mg via INTRAVENOUS
  Administered 2012-02-06: 12:00:00 via INTRAVENOUS
  Administered 2012-02-06: 1.8 mg via INTRAVENOUS
  Administered 2012-02-07: 3.13 mg via INTRAVENOUS
  Administered 2012-02-07: 03:00:00 via INTRAVENOUS
  Filled 2012-02-04 (×6): qty 25

## 2012-02-04 MED ORDER — MIDAZOLAM HCL 5 MG/5ML IJ SOLN
INTRAMUSCULAR | Status: DC | PRN
  Administered 2012-02-04: 2 mg via INTRAVENOUS

## 2012-02-04 MED ORDER — DIPHENHYDRAMINE HCL 50 MG/ML IJ SOLN
12.5000 mg | Freq: Four times a day (QID) | INTRAMUSCULAR | Status: DC | PRN
  Administered 2012-02-06: 12.5 mg via INTRAVENOUS
  Filled 2012-02-04: qty 1

## 2012-02-04 MED ORDER — DIPHENHYDRAMINE HCL 12.5 MG/5ML PO ELIX
12.5000 mg | ORAL_SOLUTION | Freq: Four times a day (QID) | ORAL | Status: DC | PRN
  Filled 2012-02-04: qty 5

## 2012-02-04 MED ORDER — LACTATED RINGERS IV SOLN
INTRAVENOUS | Status: DC
  Administered 2012-02-04: 11:00:00 via INTRAVENOUS

## 2012-02-04 MED ORDER — KCL IN DEXTROSE-NACL 20-5-0.45 MEQ/L-%-% IV SOLN
INTRAVENOUS | Status: DC
  Administered 2012-02-04 – 2012-02-05 (×2): via INTRAVENOUS
  Administered 2012-02-06: 125 mL via INTRAVENOUS
  Administered 2012-02-06 – 2012-02-10 (×9): via INTRAVENOUS
  Administered 2012-02-10: 20 mL via INTRAVENOUS
  Administered 2012-02-12 – 2012-02-19 (×8): via INTRAVENOUS
  Filled 2012-02-04 (×37): qty 1000

## 2012-02-04 MED ORDER — NEOSTIGMINE METHYLSULFATE 1 MG/ML IJ SOLN
INTRAMUSCULAR | Status: DC | PRN
  Administered 2012-02-04: 3 mg via INTRAVENOUS

## 2012-02-04 MED ORDER — HYDROMORPHONE 0.3 MG/ML IV SOLN
INTRAVENOUS | Status: AC
  Filled 2012-02-04: qty 25

## 2012-02-04 MED ORDER — HYDROMORPHONE HCL PF 1 MG/ML IJ SOLN
0.2500 mg | INTRAMUSCULAR | Status: DC | PRN
  Administered 2012-02-04 (×4): 0.5 mg via INTRAVENOUS

## 2012-02-04 MED ORDER — 0.9 % SODIUM CHLORIDE (POUR BTL) OPTIME
TOPICAL | Status: DC | PRN
  Administered 2012-02-04: 2000 mL

## 2012-02-04 SURGICAL SUPPLY — 65 items
BLADE SURG ROTATE 9660 (MISCELLANEOUS) ×2 IMPLANT
CANISTER SUCTION 2500CC (MISCELLANEOUS) ×2 IMPLANT
CHLORAPREP W/TINT 26ML (MISCELLANEOUS) ×2 IMPLANT
CLOTH BEACON ORANGE TIMEOUT ST (SAFETY) ×2 IMPLANT
COVER SURGICAL LIGHT HANDLE (MISCELLANEOUS) ×2 IMPLANT
DRAPE LAPAROSCOPIC ABDOMINAL (DRAPES) ×2 IMPLANT
DRAPE PROXIMA HALF (DRAPES) ×2 IMPLANT
DRAPE UTILITY 15X26 W/TAPE STR (DRAPE) ×4 IMPLANT
DRAPE WARM FLUID 44X44 (DRAPE) ×2 IMPLANT
DRSG PAD ABDOMINAL 8X10 ST (GAUZE/BANDAGES/DRESSINGS) ×2 IMPLANT
ELECT BLADE 6.5 EXT (BLADE) ×2 IMPLANT
ELECT CAUTERY BLADE 6.4 (BLADE) ×4 IMPLANT
ELECT REM PT RETURN 9FT ADLT (ELECTROSURGICAL) ×2
ELECTRODE REM PT RTRN 9FT ADLT (ELECTROSURGICAL) ×1 IMPLANT
GLOVE BIO SURGEON STRL SZ7.5 (GLOVE) ×2 IMPLANT
GLOVE BIO SURGEON STRL SZ8 (GLOVE) ×4 IMPLANT
GLOVE BIOGEL PI IND STRL 6 (GLOVE) ×1 IMPLANT
GLOVE BIOGEL PI IND STRL 7.0 (GLOVE) ×1 IMPLANT
GLOVE BIOGEL PI IND STRL 7.5 (GLOVE) ×1 IMPLANT
GLOVE BIOGEL PI IND STRL 8 (GLOVE) ×2 IMPLANT
GLOVE BIOGEL PI INDICATOR 6 (GLOVE) ×1
GLOVE BIOGEL PI INDICATOR 7.0 (GLOVE) ×1
GLOVE BIOGEL PI INDICATOR 7.5 (GLOVE) ×1
GLOVE BIOGEL PI INDICATOR 8 (GLOVE) ×2
GLOVE ECLIPSE 7.5 STRL STRAW (GLOVE) ×4 IMPLANT
GLOVE SS BIOGEL STRL SZ 6 (GLOVE) ×1 IMPLANT
GLOVE SUPERSENSE BIOGEL SZ 6 (GLOVE) ×1
GLOVE SURG SS PI 7.0 STRL IVOR (GLOVE) ×6 IMPLANT
GOWN STRL NON-REIN LRG LVL3 (GOWN DISPOSABLE) ×10 IMPLANT
KIT BASIN OR (CUSTOM PROCEDURE TRAY) ×2 IMPLANT
KIT COLOSTOMY ILEOSTOMY 2.75 (WOUND CARE) ×2 IMPLANT
KIT ROOM TURNOVER OR (KITS) ×2 IMPLANT
LEGGING LITHOTOMY PAIR STRL (DRAPES) ×2 IMPLANT
LIGASURE IMPACT 36 18CM CVD LR (INSTRUMENTS) ×2 IMPLANT
NS IRRIG 1000ML POUR BTL (IV SOLUTION) ×2 IMPLANT
PACK GENERAL/GYN (CUSTOM PROCEDURE TRAY) ×2 IMPLANT
PAD ARMBOARD 7.5X6 YLW CONV (MISCELLANEOUS) ×4 IMPLANT
SPECIMEN JAR X LARGE (MISCELLANEOUS) ×2 IMPLANT
SPONGE GAUZE 4X4 12PLY (GAUZE/BANDAGES/DRESSINGS) ×2 IMPLANT
SPONGE LAP 18X18 X RAY DECT (DISPOSABLE) ×2 IMPLANT
STAPLER CIRC CVD 29MM 37CM (STAPLE) ×2 IMPLANT
STAPLER CUT CVD 40MM BLUE (STAPLE) ×2 IMPLANT
STAPLER CUT RELOAD BLUE (STAPLE) ×2 IMPLANT
STAPLER VISISTAT 35W (STAPLE) ×2 IMPLANT
SUCTION POOLE TIP (SUCTIONS) ×2 IMPLANT
SURGILUBE 2OZ TUBE FLIPTOP (MISCELLANEOUS) ×2 IMPLANT
SUT PDS AB 1 TP1 96 (SUTURE) ×4 IMPLANT
SUT PROLENE 2 0 CT2 30 (SUTURE) IMPLANT
SUT PROLENE 2 0 KS (SUTURE) ×2 IMPLANT
SUT SILK 2 0 SH CR/8 (SUTURE) ×4 IMPLANT
SUT SILK 2 0 TIES 10X30 (SUTURE) ×2 IMPLANT
SUT SILK 3 0 SH CR/8 (SUTURE) ×4 IMPLANT
SUT SILK 3 0 TIES 10X30 (SUTURE) ×2 IMPLANT
SUT VIC AB 3-0 SH 27 (SUTURE)
SUT VIC AB 3-0 SH 27X BRD (SUTURE) IMPLANT
SUT VIC AB 3-0 SH 8-18 (SUTURE) IMPLANT
TAPE CLOTH SURG 6X10 WHT LF (GAUZE/BANDAGES/DRESSINGS) ×2 IMPLANT
TOWEL OR 17X24 6PK STRL BLUE (TOWEL DISPOSABLE) ×2 IMPLANT
TOWEL OR 17X26 10 PK STRL BLUE (TOWEL DISPOSABLE) ×2 IMPLANT
TRAY FOLEY CATH 14FRSI W/METER (CATHETERS) ×2 IMPLANT
TRAY PROCTOSCOPIC FIBER OPTIC (SET/KITS/TRAYS/PACK) ×2 IMPLANT
TUBE CONNECTING 12X1/4 (SUCTIONS) ×2 IMPLANT
UNDERPAD 30X30 INCONTINENT (UNDERPADS AND DIAPERS) ×2 IMPLANT
WATER STERILE IRR 1000ML POUR (IV SOLUTION) ×2 IMPLANT
YANKAUER SUCT BULB TIP NO VENT (SUCTIONS) ×2 IMPLANT

## 2012-02-04 SURGICAL SUPPLY — 47 items
BLADE SURG ROTATE 9660 (MISCELLANEOUS) IMPLANT
CANISTER SUCTION 2500CC (MISCELLANEOUS) ×2 IMPLANT
CHLORAPREP W/TINT 26ML (MISCELLANEOUS) ×2 IMPLANT
CLOTH BEACON ORANGE TIMEOUT ST (SAFETY) ×2 IMPLANT
COVER SURGICAL LIGHT HANDLE (MISCELLANEOUS) ×2 IMPLANT
DRAPE LAPAROSCOPIC ABDOMINAL (DRAPES) ×2 IMPLANT
DRAPE PROXIMA HALF (DRAPES) ×2 IMPLANT
DRAPE UTILITY 15X26 W/TAPE STR (DRAPE) ×4 IMPLANT
DRAPE WARM FLUID 44X44 (DRAPE) ×2 IMPLANT
ELECT BLADE 6.5 EXT (BLADE) ×2 IMPLANT
ELECT CAUTERY BLADE 6.4 (BLADE) ×4 IMPLANT
ELECT REM PT RETURN 9FT ADLT (ELECTROSURGICAL) ×2
ELECTRODE REM PT RTRN 9FT ADLT (ELECTROSURGICAL) ×1 IMPLANT
GLOVE BIOGEL PI IND STRL 8 (GLOVE) ×1 IMPLANT
GLOVE BIOGEL PI INDICATOR 8 (GLOVE) ×1
GLOVE ECLIPSE 7.5 STRL STRAW (GLOVE) ×4 IMPLANT
GOWN STRL NON-REIN LRG LVL3 (GOWN DISPOSABLE) ×6 IMPLANT
KIT BASIN OR (CUSTOM PROCEDURE TRAY) ×2 IMPLANT
KIT ROOM TURNOVER OR (KITS) ×2 IMPLANT
LEGGING LITHOTOMY PAIR STRL (DRAPES) ×2 IMPLANT
LIGASURE IMPACT 36 18CM CVD LR (INSTRUMENTS) IMPLANT
NS IRRIG 1000ML POUR BTL (IV SOLUTION) ×2 IMPLANT
PACK GENERAL/GYN (CUSTOM PROCEDURE TRAY) ×2 IMPLANT
PAD ARMBOARD 7.5X6 YLW CONV (MISCELLANEOUS) ×4 IMPLANT
SPECIMEN JAR X LARGE (MISCELLANEOUS) ×2 IMPLANT
SPONGE GAUZE 4X4 12PLY (GAUZE/BANDAGES/DRESSINGS) ×2 IMPLANT
SPONGE LAP 18X18 X RAY DECT (DISPOSABLE) IMPLANT
STAPLER VISISTAT 35W (STAPLE) ×2 IMPLANT
SUCTION POOLE TIP (SUCTIONS) ×2 IMPLANT
SURGILUBE 2OZ TUBE FLIPTOP (MISCELLANEOUS) ×2 IMPLANT
SUT PDS AB 1 TP1 96 (SUTURE) ×4 IMPLANT
SUT PROLENE 2 0 CT2 30 (SUTURE) IMPLANT
SUT PROLENE 2 0 KS (SUTURE) IMPLANT
SUT SILK 2 0 SH CR/8 (SUTURE) ×2 IMPLANT
SUT SILK 2 0 TIES 10X30 (SUTURE) ×2 IMPLANT
SUT SILK 3 0 SH CR/8 (SUTURE) ×2 IMPLANT
SUT SILK 3 0 TIES 10X30 (SUTURE) ×2 IMPLANT
SUT VIC AB 3-0 SH 27 (SUTURE) ×2
SUT VIC AB 3-0 SH 27X BRD (SUTURE) ×2 IMPLANT
SUT VIC AB 3-0 SH 8-18 (SUTURE) ×2 IMPLANT
TOWEL OR 17X24 6PK STRL BLUE (TOWEL DISPOSABLE) ×2 IMPLANT
TOWEL OR 17X26 10 PK STRL BLUE (TOWEL DISPOSABLE) ×2 IMPLANT
TRAY FOLEY CATH 14FRSI W/METER (CATHETERS) IMPLANT
TRAY PROCTOSCOPIC FIBER OPTIC (SET/KITS/TRAYS/PACK) ×2 IMPLANT
UNDERPAD 30X30 INCONTINENT (UNDERPADS AND DIAPERS) ×2 IMPLANT
WATER STERILE IRR 1000ML POUR (IV SOLUTION) ×2 IMPLANT
YANKAUER SUCT BULB TIP NO VENT (SUCTIONS) ×2 IMPLANT

## 2012-02-04 NOTE — Op Note (Signed)
OPERATIVE REPORT  DATE OF OPERATION: 01/27/2012 - 02/04/2012  PATIENT:  Vanessa [name]  36 y.o. female  PRE-OPERATIVE DIAGNOSIS:  Sigmoid Stricture  POST-OPERATIVE DIAGNOSIS:  Sigmoid Stricture  PROCEDURE:  Procedure(s): COLON RESECTION SIGMOID/DIVERTING LOOP ILEOSTOMY  SURGEON:  Surgeon(s): Cherylynn Ridges, MD  ASSISTANT: Cornett  ANESTHESIA:   general  EBL: 100cc ml  BLOOD ADMINISTERED: none  DRAINS: Urinary Catheter (Foley) and RLQ loop ileostomy   SPECIMEN:  Source of Specimen:  rectosigmoid colon  COUNTS CORRECT:  YES  PROCEDURE DETAILS: The patient was taken to the operating room and placed on the table in the supine position. After an adequate general endotracheal anesthetic was administered she was placed in the lithotomy position and prepped and draped in usual sterile manner exposing her entire abdomen and the perineal area.  After proper time out was performed identifying the patient and the procedure to be performed we made a midline incision from just above the umbilicus to the left of it down to the pubic crest. We taken down to and through the midline fascia using electrocautery. Once this was done the patient was placed in Trendelenburg position. A Balfour retractor was placed into the abdominal cavity with the bladder blade in place retracting the bladder out of place. We used moistened lap tapes nor to pack off the bowel in the upper abdomen.  The disease: Was deep down in the pelvic at the pelvic rim was very hard and stricture and fibrotic. It was also very friable and obviously injured by the radiation treatment. Just above the pelvic inlet was soft his the sigmoid colon where we transected it with a Contour stapling device. This allowed [place] to free up the distal sigmoid colon down to the rectum. Care was taken to mobilize the sigmoid colon and the descending colon at the line of Toldt. Doing so we were able to see the patient ureter was seen to be slightly  dilated possibly secondary to some scarring in the pelvic area. It was not injured in the process.  We were able to dissect down posteriorly and laterally on the very scarred and fibrotic colon. Taking it down to the peritoneal reflection. A LigaSure device was used to help attain adequate hemostasis. We were able to do so very well.  Once we got down to the peritoneal reflection of the rectum a reload on the Contour stapling device allowed [place] to staple across the distal sigmoid colon and proximal rectum. This freed up this very fibrotic and friable specimen which was passed off the field.  We mobilized the distal descending colon so we had adequate length in order to perform an end-to-end anastomosis with the rectal stump. We used a pursestring device on the end of the descending colon and a 2-0 Prolene on a Keefe needle to pursestring the open end of the descending colon. We subsequently used a knife to cut out the stapled end. The pursestring suture was in good position. We chose a 29 mm premium EEA  to perform the anastomosis. The anvil for that sized EEA stapler was placed into the distal end of the descending colon and tied down with a pursestring suture. The assistant surgeon went to the perineum and pass the stapling device through the anus and rectum after proper dilatation. We passed it up just anterior to the staple line of the rectal stump.We attached the anvil to the stapling device and closed it.  We subsequently fired it.  A complete anastomosis was performed. It appeared  to be without any tension whatsoever. We irrigated the area with saline solution there was no evidence of obvious leakage. We filled the pelvis with saline then subsequently insufflated the pelvis with gas into the rectum all blocking it off with a noncrushing bowel clamp. The shoulder there was no evidence of any air leak from the staple line.  However because of the patient's history of radiation therapy and the possibility  that this end-to-end anastomoses may not heal properly we did perform a loop ileostomy in the right lower quadrant which was brought out prior to closure.  The surgeon and assistants glass were changed prior to irrigating the pelvic area. This was done so and then closed the abdomen. He was closed with looped #1 PDS suture. The skin was closed using stainless steel staples.  Loop ileostomy in the right lower quadrant was matured after opening the enterostomy using electrocautery. We matured using 3-0 Vicryl pop-off stitches. A stomal bag was then applied all counts were correct including needles, sponges, and instruments. PATIENT DISPOSITION:  PACU - hemodynamically stable.   Reylene Stauder III,Kara Mierzejewski O 5/14/20132:23 PM

## 2012-02-04 NOTE — Anesthesia Preprocedure Evaluation (Addendum)
Anesthesia Evaluation  Patient identified by MRN, date of birth, ID band Patient awake    Reviewed: Allergy & Precautions, H&P , NPO status , Patient's Chart, lab work & pertinent test results  History of Anesthesia Complications Negative for: history of anesthetic complications  Airway Mallampati: I TM Distance: >3 FB Neck ROM: Full    Dental  (+) Teeth Intact and Dental Advisory Given   Pulmonary neg pulmonary ROS,  breath sounds clear to auscultation  Pulmonary exam normal       Cardiovascular hypertension, Pt. on medications Rhythm:Regular Rate:Normal     Neuro/Psych negative neurological ROS     GI/Hepatic Neg liver ROS, Bowel prep,  Endo/Other  negative endocrine ROS  Renal/GU negative Renal ROS     Musculoskeletal   Abdominal   Peds  Hematology negative hematology ROS (+)   Anesthesia Other Findings   Reproductive/Obstetrics Cervical Ca, in remission, s/p rad/chemo                           Anesthesia Physical Anesthesia Plan  ASA: III  Anesthesia Plan: General   Post-op Pain Management:    Induction: Intravenous  Airway Management Planned: Oral ETT  Additional Equipment:   Intra-op Plan:   Post-operative Plan: Extubation in OR  Informed Consent: I have reviewed the patients History and Physical, chart, labs and discussed the procedure including the risks, benefits and alternatives for the proposed anesthesia with the patient or authorized representative who has indicated his/her understanding and acceptance.   Dental advisory given  Plan Discussed with: CRNA, Anesthesiologist and Surgeon  Anesthesia Plan Comments:         Anesthesia Quick Evaluation

## 2012-02-04 NOTE — Progress Notes (Signed)
Patient ID: Vanessa [name], female   DOB: Jan 28, 1976, 36 y.o.   MRN: [id number] 5 Days Post-Op  Subjective: Pt feels ok.  Having some pain, but mild  Objective: Vital signs in last 24 hours: Temp:  [97.4 F (36.3 C)-98 F (36.7 C)] 98 F (36.7 C) (05/14 0511) Pulse Rate:  [51-62] 51  (05/14 0511) Resp:  [17-18] 17  (05/14 0511) BP: (142-152)/(84-100) 142/84 mmHg (05/14 0511) SpO2:  [99 %-100 %] 100 % (05/14 0511) Weight:  [161 lb 9.6 oz (73.3 kg)] 161 lb 9.6 oz (73.3 kg) (05/14 0511) Last BM Date: 02/03/12  Intake/Output from previous day: 05/13 0701 - 05/14 0700 In: 1293.3 [P.O.:720; I.V.:573.3] Out: 1225 [Urine:1225] Intake/Output this shift:    PE: Abd: soft, mild left sided tenderness  Lab Results:  No results found for this basename: WBC:2,HGB:2,HCT:2,PLT:2 in the last 72 hours BMET  [facility] 02/04/12 0610  NA 136  K 4.3  CL 103  CO2 24  GLUCOSE 78  BUN 3*  CREATININE 0.63  CALCIUM 10.9*   PT/INR No results found for this basename: LABPROT:2,INR:2 in the last 72 hours CMP     Component Value Date/Time   NA 136 02/04/2012 0610   K 4.3 02/04/2012 0610   CL 103 02/04/2012 0610   CO2 24 02/04/2012 0610   GLUCOSE 78 02/04/2012 0610   BUN 3* 02/04/2012 0610   CREATININE 0.63 02/04/2012 0610   CALCIUM 10.9* 02/04/2012 0610   CALCIUM 10.7* 02/01/2012 0705   PROT 7.1 01/31/2012 0535   ALBUMIN 3.2* 01/31/2012 0535   AST 14 01/31/2012 0535   ALT 10 01/31/2012 0535   ALKPHOS 108 01/31/2012 0535   BILITOT 0.3 01/31/2012 0535   GFRNONAA >90 02/04/2012 0610   GFRAA >90 02/04/2012 0610   Lipase     Component Value Date/Time   LIPASE 16 11/13/2011 1007       Studies/Results: Dg Abd Acute W/chest  02/02/2012  *RADIOLOGY REPORT*  Clinical Data: Abdominal pain.  Perforation.  Small bowel obstruction.  Improving abdominal pain and tenderness.  ACUTE ABDOMEN SERIES (ABDOMEN 2 VIEW & CHEST 1 VIEW)  Comparison: 01/31/2012.  Findings: Residual contrast is present in the colon.  Cardiopericardial silhouette within normal limits. Mediastinal contours normal. Trachea midline.  No airspace disease or effusion. No free air underneath the hemidiaphragms. Cholecystectomy clips are present in the right upper quadrant.  Fluid levels are present in the colon.  On the upright radiograph, there is narrowing of the junction of the descending colon and sigmoid colon which on the supine radiographs resolves, likely representing peristaltic contraction.  There is however, fixed narrowing of the sigmoid colon with irregular contours.  The narrowing is circumferential. This has been previously described on CT and barium enema.  There has been distal progression of contrast from the colon so the obstruction is not complete.  IMPRESSION: High-grade sigmoid stricture, described on prior exams.  Gaseous distention of the proximal colon indicating some degree of obstruction however some of the contrast administered in conjunction with prior enema has passed.  Original Report Authenticated By: Andreas Newport, M.D.    Anti-infectives: Anti-infectives     Start     Dose/Rate Route Frequency Ordered Stop   02/04/12 0000   cefOXitin (MEFOXIN) 2 g in dextrose 5 % 50 mL IVPB        2 g 100 mL/hr over 30 Minutes Intravenous 60 min pre-op 02/03/12 0333     02/03/12 0500   cefOXitin (MEFOXIN) 2 g in dextrose  5 % 50 mL IVPB  Status:  Discontinued        2 g 100 mL/hr over 30 Minutes Intravenous 60 min pre-op 02/02/12 1156 02/03/12 0333   02/03/12 0000   cefOXitin (MEFOXIN) 2 g in dextrose 5 % 50 mL IVPB  Status:  Discontinued        2 g 100 mL/hr over 30 Minutes Intravenous 60 min pre-op 02/02/12 1004 02/02/12 1156   01/27/12 2200   cefTRIAXone (ROCEPHIN) 1 g in dextrose 5 % 50 mL IVPB        1 g 100 mL/hr over 30 Minutes Intravenous Every 24 hours 01/27/12 2152 01/29/12 2359   01/27/12 1915   cefTRIAXone (ROCEPHIN) 1 g in dextrose 5 % 50 mL IVPB        1 g 100 mL/hr over 30 Minutes Intravenous   Once 01/27/12 1914 01/27/12 1957           Assessment/Plan  1. 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 To OR today for colectomy.   LOS: 8 days    Daquann Merriott E 02/04/2012

## 2012-02-04 NOTE — Progress Notes (Signed)
Patient return  from surgery with and colostomy to right lower quad. Site clean dry intact. Guaze to midline C/D/I.  Patient very  pain to touch site.Patient resting confortabley and responses to command.  PCA set up and running. Patient verbalizes understanding of use.

## 2012-02-04 NOTE — Anesthesia Procedure Notes (Signed)
Procedure Name: Intubation Date/Time: 02/04/2012 11:49 AM Performed by: Edmonia Caprio Pre-anesthesia Checklist: Patient identified, Emergency Drugs available, Suction available, Patient being monitored and Timeout performed Patient Re-evaluated:Patient Re-evaluated prior to inductionOxygen Delivery Method: Circle system utilized Preoxygenation: Pre-oxygenation with 100% oxygen Intubation Type: IV induction Ventilation: Mask ventilation without difficulty Laryngoscope Size: Mac and 3 Grade View: Grade I Tube type: Oral Tube size: 7.5 mm Number of attempts: 1 Airway Equipment and Method: Stylet Placement Confirmation: ETT inserted through vocal cords under direct vision,  positive ETCO2 and breath sounds checked- equal and bilateral Secured at: 21 cm Tube secured with: Tape Dental Injury: Teeth and Oropharynx as per pre-operative assessment

## 2012-02-04 NOTE — Anesthesia Postprocedure Evaluation (Signed)
Anesthesia Post Note  Patient: Vanessa [name]  Procedure(s) Performed: Procedure(s) (LRB): COLON RESECTION SIGMOID (N/A)  Anesthesia type: general  Patient location: PACU  Post pain: Pain level controlled  Post assessment: Patient's Cardiovascular Status Stable  Last Vitals:  Filed Vitals:   02/04/12 1514  BP:   Pulse:   Temp:   Resp: 18    Post vital signs: Reviewed and stable  Level of consciousness: sedated  Complications: No apparent anesthesia complications

## 2012-02-04 NOTE — Preoperative (Signed)
Beta Blockers   Reason not to administer Beta Blockers:Not Applicable 

## 2012-02-04 NOTE — Progress Notes (Signed)
Patient ID: LAVEAH GLOSTER, female   DOB: Aug 23, 1976, 36 y.o.   MRN: [id number]   PATIENT DETAILS Name: Vanessa [name] Age: 36 y.o. Sex: female Date of Birth: 1976/06/25 Admit Date: 01/27/2012 JYN:[id number],HYQMV A, MD, MD  Subjective: Complaining of increased abdominal pain.   Objective: Vital signs in last 24 hours: Filed Vitals:   02/03/12 1432 02/03/12 2137 02/04/12 0511 02/04/12 1008  BP: 152/93 150/100 142/84 158/96  Pulse: 62 61 51 60  Temp: 97.8 F (36.6 C) 97.4 F (36.3 C) 98 F (36.7 C) 97.8 F (36.6 C)  TempSrc: Oral Oral Oral Oral  Resp: 18 18 17 18   Height:      Weight:   73.3 kg (161 lb 9.6 oz)   SpO2: 99% 100% 100% 100%    Weight change:   Body mass index is 27.74 kg/(m^2).  Intake/Output from previous day:  Intake/Output Summary (Last 24 hours) at 02/04/12 1315 Last data filed at 02/04/12 1300  Gross per 24 hour  Intake 2053.33 ml  Output   1300 ml  Net 753.33 ml    PHYSICAL EXAM: Gen Exam: Awake and alert with clear speech.    Chest: B/L Clear. No w/c/r CVS: S1 S2 Regular, no murmurs.  Abdomen: soft, BS +, mildly  Tender pelvic area, non distended. Extremities: no edema, lower extremities warm to touch. Neuro:  A&O, Cooperative, well groomed.   CONSULTS:  [facility]  LAB RESULTS: CBC  Lab 01/31/12 0535 01/30/12 1259 01/29/12 0615  WBC -- -- 10.9*  HGB 10.5* 11.6* 10.8*  HCT 31.3* 34.0* 31.7*  PLT -- -- 224  MCV -- -- 89.3  MCH -- -- 30.4  MCHC -- -- 34.1  RDW -- -- 13.4  LYMPHSABS -- -- --  MONOABS -- -- --  EOSABS -- -- --  BASOSABS -- -- --  BANDABS -- -- --    Chemistries   Lab 02/04/12 0610 02/01/12 0705 01/31/12 0535 01/30/12 1259 01/30/12 1011 01/29/12 0615  NA 136 137 135 139 138 --  K 4.3 3.5 4.2 3.9 3.8 --  CL 103 99 101 -- 101 101  CO2 24 26 24  -- 24 24  GLUCOSE 78 86 87 84 86 --  BUN 3* 4* 4* -- 3* 3*  CREATININE 0.63 0.61 0.47* -- 0.54 0.52  CALCIUM 10.9* 10.7*10.8* 11.1*  -- 10.9* 10.4  MG -- -- -- -- -- 1.6    PTH:  53 (within normal limits) Calcium (Total PTH) 10.7  MICROBIOLOGY: Recent Results (from the past 240 hour(s))  URINE CULTURE     Status: Normal   Collection Time   01/26/12  8:39 PM      Component Value Range Status Comment   Specimen Description URINE, RANDOM   Final    Special Requests NONE   Final    Culture  Setup Time [id number]   Final    Colony Count 50,000 COLONIES/ML   Final    Culture     Final    Value: Multiple bacterial morphotypes present, none predominant. Suggest appropriate recollection if clinically indicated.   Report Status 01/28/2012 FINAL   Final   URINE CULTURE     Status: Normal   Collection Time   01/27/12 11:54 PM      Component Value Range Status Comment   Specimen Description URINE, RANDOM   Final    Special Requests NONE   Final    Culture  Setup Time [id number]   Final  Colony Count NO GROWTH   Final    Culture NO GROWTH   Final    Report Status 01/29/2012 FINAL   Final   SURGICAL PCR SCREEN     Status: Normal   Collection Time   02/03/12  2:28 AM      Component Value Range Status Comment   MRSA, PCR NEGATIVE  NEGATIVE  Final    Staphylococcus aureus NEGATIVE  NEGATIVE  Final   SURGICAL PCR SCREEN     Status: Normal   Collection Time   02/04/12  6:26 AM      Component Value Range Status Comment   MRSA, PCR NEGATIVE  NEGATIVE  Final    Staphylococcus aureus NEGATIVE  NEGATIVE  Final     RADIOLOGY STUDIES/RESULTS: Ct Abdomen Pelvis W Contrast  01/28/2012  *RADIOLOGY REPORT*  Clinical Data: Left lower quadrant pain with fever.  Nausea vomiting.  CT ABDOMEN AND PELVIS WITH CONTRAST  Technique:  Multidetector CT imaging of the abdomen and pelvis was performed following the standard protocol during bolus administration of intravenous contrast.  Contrast: OMNIPAQUE IOHEXOL 300 MG/ML  SOLN   Comparison: 11/14/2011  Findings: No focal abnormalities seen in the liver or spleen.  The stomach, duodenum,  pancreas, and adrenal glands are unremarkable. Gallbladder is surgically absent.  Kidneys have normal imaging features.  No abdominal aortic aneurysm.  There is no free fluid or lymphadenopathy in the abdomen.  Imaging through the pelvis demonstrates a 10-15 cm segment of sigmoid colon shows circumferential irregular wall thickening. There is edema/inflammation in the sigmoid mesocolon and also involving the soft tissues of the pelvic floor.  The cecal tip is in the lower midline anterior pelvis.  The cecum and ascending colon contain a large volume of stool.  There is no stool visualized in the transverse colon although gas is seen in the transverse colon.  The left colon is decompressed.  There is a prominent area of soft tissue attenuation of transition from the dilated stool filled right colon to the gas filled, but none stool containing, transverse colon.  Obstructing mass lesion in the ascending colon distally is a consideration.  Bone windows reveal no worrisome lytic or sclerotic osseous lesions.  IMPRESSION:  10-15 cm segment of abnormal sigmoid colon in the lower central pelvis.  This segment shows irregular sequential wall thickening and adjacent edema/inflammation.  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  The cecum and proximal/mid ascending colon is dilated and completely stool-filled.  The stool tracks up to a soft tissue focus in the colon and there is no stool visualized in the transverse colon beyond this area of soft tissue attenuation.  Some type of an obstructing lesion in the colon at this location is suspected.  Colonoscopy may prove helpful to further evaluate.  I discussed these findings by telephone with the hospitalist mid- level Maren Reamer) at [place] hours on 01/28/2012.  Original Report Authenticated By: ERIC A. MANSELL, M.D.   Dg Colon W/cm - Wo/w Kub  01/31/2012  *RADIOLOGY REPORT*  Clinical Data:  Rectal bleeding.  Sigmoid wall thickening on the CT. Incomplete colonoscopy.  History of cervical cancer and pelvic radiation.  SINGLE COLUMN BARIUM ENEMA  Technique:  Initial scout AP supine abdominal image obtained to insure adequate colon cleansing.  Barium was introduced into the colon in a retrograde fashion and refluxed from the rectum to the cecum. Spot images of the colon followed by overhead radiographs  were obtained.  Fluoroscopy time: 2.23 minutes.  Comparison:  CT 01/28/2012  Findings:  Single contrast barium enema was performed as I felt this would give [place] the better look at the area of concern in the sigmoid colon as well as be better tolerated by the patient.  The patient had severe pain during insertion of the rectal tip.  The barium was instilled into the colon.  This shows a fixed irregular focal stricture/narrowing within the mid sigmoid colon corresponding to the abnormality on CT.  Given the patient's history, I suspect this represents radiation colitis or scarring from radiation.  The remainder of the bladder is unremarkable.  IMPRESSION: Fixed focal irregular stricture in the sigmoid colon as seen on CT, likely related to radiation.  Original Report Authenticated By: Cyndie Chime, M.D.    MEDICATIONS: Scheduled Meds:    . alvimopan  12 mg Oral 60 min Pre-Op  . amLODipine  10 mg Oral Daily  . cefOXitin  2 g Intravenous 60 min Pre-Op  . chlorhexidine  15 mL Mouth Rinse BID  . heparin subcutaneous  5,000 Units Subcutaneous Q8H  . heparin  5,000 Units Subcutaneous 90 min Pre-Op  . morphine  15 mg Oral Daily  . morphine  30 mg Oral QHS  . polyethylene glycol  17 g Oral BID  . DISCONTD: heparin subcutaneous  5,000 Units Subcutaneous Q8H  . DISCONTD: heparin  5,000 Units Subcutaneous 90 min Pre-Op   Continuous Infusions:    . sodium chloride 50 mL/hr at 02/03/12 2210  . lactated ringers 20 mL/hr at 02/04/12 1032   PRN Meds:.0.9 % irrigation (POUR BTL), chlorhexidine,  chlorhexidine, HYDROmorphone (DILAUDID) injection, ondansetron (ZOFRAN) IV, ondansetron, simethicone, zolpidem  Antibiotics: Anti-infectives     Start     Dose/Rate Route Frequency Ordered Stop   02/04/12 0000   cefOXitin (MEFOXIN) 2 g in dextrose 5 % 50 mL IVPB        2 g 100 mL/hr over 30 Minutes Intravenous 60 min pre-op 02/03/12 0333 02/04/12 1145   02/03/12 0500   cefOXitin (MEFOXIN) 2 g in dextrose 5 % 50 mL IVPB  Status:  Discontinued        2 g 100 mL/hr over 30 Minutes Intravenous 60 min pre-op 02/02/12 1156 02/03/12 0333   02/03/12 0000   cefOXitin (MEFOXIN) 2 g in dextrose 5 % 50 mL IVPB  Status:  Discontinued        2 g 100 mL/hr over 30 Minutes Intravenous 60 min pre-op 02/02/12 1004 02/02/12 1156   01/27/12 2200   cefTRIAXone (ROCEPHIN) 1 g in dextrose 5 % 50 mL IVPB        1 g 100 mL/hr over 30 Minutes Intravenous Every 24 hours 01/27/12 2152 01/29/12 2359   01/27/12 1915   cefTRIAXone (ROCEPHIN) 1 g in dextrose 5 % 50 mL IVPB        1 g 100 mL/hr over 30 Minutes Intravenous  Once 01/27/12 1914 01/27/12 1957          Assessment/Plan: Patient Active Hospital Problem List: Radiation Colitis with sigmoid stricture -Colonoscopy done under Gen Anesthesia on 5/9 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  -Barium Enema done on 5/10 showed a very narrow, sigmoid stricture.  -Evaluated by CCS-needs colectomy with diversion.  Scheduled for 5/14  Urinary Tract Infection -received 3 days of Rocephin-which has now been discontinued  Hypercalcemia -chronic issue for patient - PTH within normal limits. -  Vit D levels-still pending -now off HCTZ.  Not to be resumed at discharge. 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 -contine with current narcotic regimen.  Hopefully  this may be weened slowly after surgery.  Cervical Cancer -follow up with GYN Onc/ Radiation Onc as outpatient-has missed a lot of follow up appointments in the past  Constipation -2/2 narcotics and stricture.  -continue with miralax  HTN -monitor off HCTZ-BP moderately controlled with amlodipine currently.  Consider adding lisinopril 10 mg if bp still elevated after surgery.    Intermittent BRBPR -resolved -likely 2/2 radiation colitis -Colonoscopy incomplete due to sigmoid stricture that could not be traversed, however Barium enema showed no other lesions apart from the sigmoid stricture  Disposition: Remain inpatient  DVT Prophylaxis: Begin prophylactic heparin  Code Status: Full Code  Romualdo Bolk [facility] Pager: [phone]  02/04/2012, 1:15 PM   Attending I have seen and examined the patient, I agree with the assessment and plan and outlined above. CCS will assume service post-operatively.  Dr Windell Norfolk

## 2012-02-04 NOTE — Transfer of Care (Signed)
Immediate Anesthesia Transfer of Care Note  Patient: Vanessa [name]  Procedure(s) Performed: Procedure(s) (LRB): COLON RESECTION SIGMOID (N/A)  Patient Location: PACU  Anesthesia Type: General  Level of Consciousness: awake, alert  and oriented  Airway & Oxygen Therapy: Patient Spontanous Breathing and Patient connected to face mask oxygen  Post-op Assessment: Report given to PACU RN, Post -op Vital signs reviewed and stable and Patient moving all extremities X 4  Post vital signs: Reviewed and stable  Complications: No apparent anesthesia complications

## 2012-02-05 ENCOUNTER — Encounter (HOSPITAL_COMMUNITY): Payer: Self-pay | Admitting: General Surgery

## 2012-02-05 LAB — BASIC METABOLIC PANEL
CO2: 25 mEq/L (ref 19–32)
Calcium: 10.8 mg/dL — ABNORMAL HIGH (ref 8.4–10.5)
Glucose, Bld: 137 mg/dL — ABNORMAL HIGH (ref 70–99)
Sodium: 134 mEq/L — ABNORMAL LOW (ref 135–145)

## 2012-02-05 LAB — CBC
Hemoglobin: 13.1 g/dL (ref 12.0–15.0)
MCH: 30.8 pg (ref 26.0–34.0)
MCV: 89.2 fL (ref 78.0–100.0)
RBC: 4.25 MIL/uL (ref 3.87–5.11)

## 2012-02-05 MED ORDER — KETOROLAC TROMETHAMINE 30 MG/ML IJ SOLN
30.0000 mg | Freq: Four times a day (QID) | INTRAMUSCULAR | Status: AC
  Administered 2012-02-05 – 2012-02-08 (×12): 30 mg via INTRAVENOUS
  Filled 2012-02-05 (×13): qty 1

## 2012-02-05 NOTE — Progress Notes (Signed)
1700 Foley catheter removed per order.

## 2012-02-05 NOTE — Progress Notes (Signed)
Verified PCA Dilaudid waste of 3ml with Pat. RN as well as new PCA replacement syringe.  Forbes Cellar, RN

## 2012-02-05 NOTE — Progress Notes (Signed)
1200 Dilaudid PCA 3ml wasted in sink

## 2012-02-05 NOTE — Progress Notes (Signed)
Patient ID: Vanessa [name], female   DOB: [birth year], 36 y.o.   MRN: [id number] 1 Day Post-Op  Subjective: Pt c/o a lot of pain this morning.  Patient was on a LOT of home pain medications before surgery so her pain may be a little difficult to manage postoperatively.  No flatus in ileostomy bag.  Objective: Vital signs in last 24 hours: Temp:  [97.2 F (36.2 C)-98.1 F (36.7 C)] 97.8 F (36.6 C) (05/15 0550) Pulse Rate:  [60-103] 103  (05/15 0550) Resp:  [13-28] 21  (05/15 0550) BP: (125-158)/(85-96) 131/91 mmHg (05/15 0550) SpO2:  [96 %-100 %] 96 % (05/15 0550) Last BM Date: 02/03/12  Intake/Output from previous day: 05/14 0701 - 05/15 0700 In: 3831.7 [P.O.:240; I.V.:3441.7; IV Piggyback:150] Out: 495 [Urine:495] Intake/Output this shift:    PE: Abd: soft, -BS, ileostomy with no output, stoma is pink and viable, very tender!  Incisional dressing is clean and dry. Heart: regular, mildly tachy Lungs: CTAB  Lab Results:   Basename 02/05/12 0554  WBC 17.5*  HGB 13.1  HCT 37.9  PLT 465*   BMET  Basename 02/05/12 0554 02/04/12 0610  NA 134* 136  K 4.5 4.3  CL 99 103  CO2 25 24  GLUCOSE 137* 78  BUN 6 3*  CREATININE 0.62 0.63  CALCIUM 10.8* 10.9*   PT/INR No results found for this basename: LABPROT:2,INR:2 in the last 72 hours CMP     Component Value Date/Time   NA 134* 02/05/2012 0554   K 4.5 02/05/2012 0554   CL 99 02/05/2012 0554   CO2 25 02/05/2012 0554   GLUCOSE 137* 02/05/2012 0554   BUN 6 02/05/2012 0554   CREATININE 0.62 02/05/2012 0554   CALCIUM 10.8* 02/05/2012 0554   CALCIUM 10.7* 02/01/2012 0705   PROT 7.1 01/31/2012 0535   ALBUMIN 3.2* 01/31/2012 0535   AST 14 01/31/2012 0535   ALT 10 01/31/2012 0535   ALKPHOS 108 01/31/2012 0535   BILITOT 0.3 01/31/2012 0535   GFRNONAA >90 02/05/2012 0554   GFRAA >90 02/05/2012 0554   Lipase     Component Value Date/Time   LIPASE 16 11/13/2011 1007       Studies/Results: No results  found.  Anti-infectives: Anti-infectives     Start     Dose/Rate Route Frequency Ordered Stop   02/04/12 1800   cefOXitin (MEFOXIN) 1 g in dextrose 5 % 50 mL IVPB        1 g 100 mL/hr over 30 Minutes Intravenous Every 6 hours 02/04/12 1621 02/05/12 0617   02/04/12 0000   cefOXitin (MEFOXIN) 2 g in dextrose 5 % 50 mL IVPB        2 g 100 mL/hr over 30 Minutes Intravenous 60 min pre-op 02/03/12 0333 02/04/12 1145   02/03/12 0500   cefOXitin (MEFOXIN) 2 g in dextrose 5 % 50 mL IVPB  Status:  Discontinued        2 g 100 mL/hr over 30 Minutes Intravenous 60 min pre-op 02/02/12 1156 02/03/12 0333   02/03/12 0000   cefOXitin (MEFOXIN) 2 g in dextrose 5 % 50 mL IVPB  Status:  Discontinued        2 g 100 mL/hr over 30 Minutes Intravenous 60 min pre-op 02/02/12 1004 02/02/12 1156   01/27/12 2200   cefTRIAXone (ROCEPHIN) 1 g in dextrose 5 % 50 mL IVPB        1 g 100 mL/hr over 30 Minutes Intravenous Every 24 hours 01/27/12 2152 01/29/12  2359   01/27/12 1915   cefTRIAXone (ROCEPHIN) 1 g in dextrose 5 % 50 mL IVPB        1 g 100 mL/hr over 30 Minutes Intravenous  Once 01/27/12 1914 01/27/12 1957           Assessment/Plan  1. S/p ex lap with sigmoid colectomy and diverting ileostomy for sigmoid stricture 2. Radiation colitis  Plan: 1. Will add Toradol for pain control along with full dose dilaudid PCA 2. Dc foley on POD # 1 3. OOB and ambulate today 4. pulm toilet 5. Check labs in the morning, will follow creatinine since I put on scheduled toradol.   LOS: 9 days    Tadao Emig E 02/05/2012

## 2012-02-05 NOTE — Consult Note (Signed)
WOC ostomy consult  Stoma type/location: RUQ Ileostomy Stomal assessment/size: red, above skin level Peristomal assessment: pouch intact with good seal, not removed  Output  No stool or flatus at present Ostomy pouching: 1pc  Education provided: briefly discussed pouching routine, pt in large amt of discomfort on first post-op day, will return tomorrow for further education session.  Supples ordered for bedside nurse.  Vesta Mixer RN BSN wound care student  Cammie Mcgee, RN, MSN, [employer]  [phone]

## 2012-02-05 NOTE — Progress Notes (Signed)
Patient doing better this afternoon.  Minimal output from ileostomy.  Clear liquids okay.  Toradol has helped.  Marta Lamas. [name], MD, FACS [phone] [facility]

## 2012-02-05 NOTE — Progress Notes (Signed)
Ostomy supplies placed at bedside as ordered.

## 2012-02-06 LAB — CBC
HCT: 32.2 % — ABNORMAL LOW (ref 36.0–46.0)
HCT: 32.6 % — ABNORMAL LOW (ref 36.0–46.0)
Hemoglobin: 10.9 g/dL — ABNORMAL LOW (ref 12.0–15.0)
Hemoglobin: 11.1 g/dL — ABNORMAL LOW (ref 12.0–15.0)
MCH: 30.2 pg (ref 26.0–34.0)
MCHC: 34 g/dL (ref 30.0–36.0)
MCV: 90.2 fL (ref 78.0–100.0)
MCV: 88.8 fL (ref 78.0–100.0)
RBC: 3.57 MIL/uL — ABNORMAL LOW (ref 3.87–5.11)
RBC: 3.67 MIL/uL — ABNORMAL LOW (ref 3.87–5.11)
RDW: 13.8 % (ref 11.5–15.5)
WBC: 19.5 10*3/uL — ABNORMAL HIGH (ref 4.0–10.5)

## 2012-02-06 LAB — BASIC METABOLIC PANEL
CO2: 26 mEq/L (ref 19–32)
Chloride: 99 mEq/L (ref 96–112)
Creatinine, Ser: 0.55 mg/dL (ref 0.50–1.10)
GFR calc Af Amer: >90 mL/min (ref >90)
Potassium: 4.3 mEq/L (ref 3.5–5.1)
Sodium: 131 mEq/L — ABNORMAL LOW (ref 135–145)

## 2012-02-06 LAB — VITAMIN D 1,25 DIHYDROXY
Vitamin D2 1, 25 (OH)2: <8 pg/mL
Vitamin D3 1, 25 (OH)2: 66 pg/mL

## 2012-02-06 MED ORDER — ACETAMINOPHEN 325 MG PO TABS
650.0000 mg | ORAL_TABLET | Freq: Four times a day (QID) | ORAL | Status: DC | PRN
  Administered 2012-02-06: 650 mg via ORAL
  Filled 2012-02-06 (×4): qty 2

## 2012-02-06 MED ORDER — HYDROMORPHONE HCL PF 1 MG/ML IJ SOLN
2.0000 mg | Freq: Once | INTRAMUSCULAR | Status: AC
  Administered 2012-02-06: 2 mg via INTRAVENOUS
  Filled 2012-02-06: qty 2

## 2012-02-06 MED ORDER — SODIUM CHLORIDE 0.9 % IV BOLUS (SEPSIS)
500.0000 mL | Freq: Once | INTRAVENOUS | Status: AC
  Administered 2012-02-06: 500 mL via INTRAVENOUS

## 2012-02-06 MED ORDER — LABETALOL HCL 5 MG/ML IV SOLN
5.0000 mg | Freq: Once | INTRAVENOUS | Status: AC
  Administered 2012-02-06: 5 mg via INTRAVENOUS
  Filled 2012-02-06: qty 4

## 2012-02-06 NOTE — Progress Notes (Signed)
To the OR today for sigmoid colectomy.  Vanessa [name]. [name], MD, FACS [phone] [facility]

## 2012-02-06 NOTE — Consult Note (Signed)
Ostomy follow-up:  Pouch intact.  Pt requests educational session tomorrow. Supplies at bedside.  Pt states there will be no other family members participating in teaching.  Cammie Mcgee, RN, MSN, [employer]  [phone]

## 2012-02-06 NOTE — Progress Notes (Signed)
Notified Dr. Janee Morn that patient's HR is 139-140 sustained. Patient's respirations is 26. Dr. Janee Morn stated he would put in orders. Will continue to monitor patient. Nelda Marseille, RN

## 2012-02-06 NOTE — Progress Notes (Signed)
Notified Dr. Janee Morn that patient has small raised bumps to bilateral arms after giving the Labetalol. Patient states that she started itching and she looked down at her arms and noticed bumps on both arms. Gave patient Benadryl 12.5mg  IV for itching as ordered. Dr. Janee Morn stated okay and no more labetalol. Will continue to monitor patient. Nelda Marseille, RN

## 2012-02-06 NOTE — Progress Notes (Signed)
02/06/12 Nursing 2356 Patient states that she is not itching anymore. Bumps on bilateral arms have decreased in number. Small amount of bumps around patient's bilateral wrist noted now. Will continue to monitor patient. Jerrye Bushy

## 2012-02-06 NOTE — Progress Notes (Signed)
Wasted 2mL Dilaudid PCA into sink when replacing syringe. Witnessed by Alexis Frock, RNs.  Idalia Needle RN

## 2012-02-06 NOTE — Progress Notes (Signed)
Patient ID: Vanessa [name], female   DOB: 1975-10-08, 36 y.o.   MRN: [id number] 2 Days Post-Op  Subjective: Pt states she doesn't feel any better than yesterday, but says that the Toradol is helping her pain more.  Some liquid out of ostomy.  Tolerating minimal clears, not much appetitie, but no nausea.  Objective: Vital signs in last 24 hours: Temp:  [98.1 F (36.7 C)-98.9 F (37.2 C)] 98.9 F (37.2 C) (05/16 0500) Pulse Rate:  [98-108] 107  (05/16 0500) Resp:  [18-24] 22  (05/16 0500) BP: (120-134)/(79-91) 120/80 mmHg (05/16 0500) SpO2:  [97 %-100 %] 97 % (05/16 0500) Last BM Date: 02/03/12  Intake/Output from previous day: 05/15 0701 - 05/16 0700 In: 3548 [P.O.:582; I.V.:2966] Out: 450 [Urine:450] Intake/Output this shift:    PE: Abd: soft, few BS, ND, tender, incision c/d/i, ostomy with minimal serous output.  Stoma is pink and healthy  Lab Results:   Basename 02/05/12 0554  WBC 17.5*  HGB 13.1  HCT 37.9  PLT 465*   BMET  Basename 02/05/12 0554 02/04/12 0610  NA 134* 136  K 4.5 4.3  CL 99 103  CO2 25 24  GLUCOSE 137* 78  BUN 6 3*  CREATININE 0.62 0.63  CALCIUM 10.8* 10.9*   PT/INR No results found for this basename: LABPROT:2,INR:2 in the last 72 hours CMP     Component Value Date/Time   NA 134* 02/05/2012 0554   K 4.5 02/05/2012 0554   CL 99 02/05/2012 0554   CO2 25 02/05/2012 0554   GLUCOSE 137* 02/05/2012 0554   BUN 6 02/05/2012 0554   CREATININE 0.62 02/05/2012 0554   CALCIUM 10.8* 02/05/2012 0554   CALCIUM 10.7* 02/01/2012 0705   PROT 7.1 01/31/2012 0535   ALBUMIN 3.2* 01/31/2012 0535   AST 14 01/31/2012 0535   ALT 10 01/31/2012 0535   ALKPHOS 108 01/31/2012 0535   BILITOT 0.3 01/31/2012 0535   GFRNONAA >90 02/05/2012 0554   GFRAA >90 02/05/2012 0554   Lipase     Component Value Date/Time   LIPASE 16 11/13/2011 1007       Studies/Results: No results found.  Anti-infectives: Anti-infectives     Start     Dose/Rate Route Frequency Ordered  Stop   02/04/12 1800   cefOXitin (MEFOXIN) 1 g in dextrose 5 % 50 mL IVPB        1 g 100 mL/hr over 30 Minutes Intravenous Every 6 hours 02/04/12 1621 02/05/12 0617   02/04/12 0000   cefOXitin (MEFOXIN) 2 g in dextrose 5 % 50 mL IVPB        2 g 100 mL/hr over 30 Minutes Intravenous 60 min pre-op 02/03/12 0333 02/04/12 1145   02/03/12 0500   cefOXitin (MEFOXIN) 2 g in dextrose 5 % 50 mL IVPB  Status:  Discontinued        2 g 100 mL/hr over 30 Minutes Intravenous 60 min pre-op 02/02/12 1156 02/03/12 0333   02/03/12 0000   cefOXitin (MEFOXIN) 2 g in dextrose 5 % 50 mL IVPB  Status:  Discontinued        2 g 100 mL/hr over 30 Minutes Intravenous 60 min pre-op 02/02/12 1004 02/02/12 1156   01/27/12 2200   cefTRIAXone (ROCEPHIN) 1 g in dextrose 5 % 50 mL IVPB        1 g 100 mL/hr over 30 Minutes Intravenous Every 24 hours 01/27/12 2152 01/29/12 2359   01/27/12 1915   cefTRIAXone (ROCEPHIN) 1 g in  dextrose 5 % 50 mL IVPB        1 g 100 mL/hr over 30 Minutes Intravenous  Once 01/27/12 1914 01/27/12 1957           Assessment/Plan  1. S/p ex lap with sigmoid colectomy and diverting ileostomy  Plan: 1. Cont on clear liquids until she has increase in output from ostomy. 2. Cont PCA and toradol for pain control 3. OOB and ambulate in halls TID today.   LOS: 10 days    Mayvis Agudelo E 02/06/2012

## 2012-02-06 NOTE — Progress Notes (Signed)
Notified Dr. Janee Morn that EKG showed Sinus Tachycardia, T wave abnormality, consider inferolateral ischemia HR 139. Dr. Janee Morn gave verbal order for Labetalol 5mg  IV x1. Will continue to monitor. Nelda Marseille, Rn

## 2012-02-06 NOTE — Progress Notes (Signed)
Agree with everything said by PA   She needs to become more ambulatory. Wound uncovered and looks okay.  Marta Lamas. [name], MD, FACS [phone] [facility]

## 2012-02-06 NOTE — ED Provider Notes (Signed)
Medical screening examination/treatment/procedure(s) were performed by non-physician practitioner and as supervising physician I was immediately available for consultation/collaboration.   Marsh Heckler A Janetta Vandoren, MD 02/06/12 2336 

## 2012-02-06 NOTE — Progress Notes (Signed)
Pt tachycardia is sustained despite pain control. Pain is "4 out of 10". Pulse 150, BP 116/81, Temp 100.6, Resp 25-35. Paged Dr Janee Morn. Pt is hesitant to void due to pain with movement, but voided 400 during a bladder scan earlier this afternoon (according to previous RN, Diane). NS bolus (500 cc) ordered for possible hypovolemia. Tylenol ordered for fever. Will continue to monitor.

## 2012-02-07 DIAGNOSIS — G8929 Other chronic pain: Secondary | ICD-10-CM

## 2012-02-07 LAB — CBC
HCT: 32.9 % — ABNORMAL LOW (ref 36.0–46.0)
MCH: 29.8 pg (ref 26.0–34.0)
MCV: 88.4 fL (ref 78.0–100.0)
Platelets: 412 10*3/uL — ABNORMAL HIGH (ref 150–400)
RDW: 13.7 % (ref 11.5–15.5)
WBC: 8.3 10*3/uL (ref 4.0–10.5)

## 2012-02-07 MED ORDER — MORPHINE SULFATE ER 15 MG PO TBCR
15.0000 mg | EXTENDED_RELEASE_TABLET | Freq: Every day | ORAL | Status: DC
  Administered 2012-02-07 – 2012-02-27 (×19): 15 mg via ORAL
  Filled 2012-02-07: qty 1
  Filled 2012-02-07: qty 2
  Filled 2012-02-07 (×9): qty 1
  Filled 2012-02-07 (×2): qty 2
  Filled 2012-02-07: qty 1
  Filled 2012-02-07: qty 2
  Filled 2012-02-07 (×12): qty 1

## 2012-02-07 MED ORDER — DIPHENHYDRAMINE HCL 50 MG/ML IJ SOLN: 12.5000 mg | Freq: Four times a day (QID) | INTRAMUSCULAR | Status: DC | PRN

## 2012-02-07 MED ORDER — HYDROMORPHONE HCL PF 1 MG/ML IJ SOLN
0.5000 mg | INTRAMUSCULAR | Status: DC | PRN
  Administered 2012-02-07: 2 mg via INTRAVENOUS
  Administered 2012-02-08 – 2012-02-09 (×6): 1 mg via INTRAVENOUS
  Administered 2012-02-09 – 2012-02-10 (×6): 2 mg via INTRAVENOUS
  Filled 2012-02-07: qty 1
  Filled 2012-02-07: qty 2
  Filled 2012-02-07 (×2): qty 1
  Filled 2012-02-07: qty 2
  Filled 2012-02-07: qty 1
  Filled 2012-02-07 (×6): qty 2

## 2012-02-07 MED ORDER — HYDROCHLOROTHIAZIDE 12.5 MG PO CAPS
12.5000 mg | ORAL_CAPSULE | Freq: Every day | ORAL | Status: DC
  Administered 2012-02-07 – 2012-02-25 (×17): 12.5 mg via ORAL
  Filled 2012-02-07 (×20): qty 1

## 2012-02-07 MED ORDER — OXYCODONE-ACETAMINOPHEN 5-325 MG PO TABS
1.0000 | ORAL_TABLET | Freq: Four times a day (QID) | ORAL | Status: DC | PRN
  Administered 2012-02-07 – 2012-02-12 (×12): 2 via ORAL
  Administered 2012-02-13: 1 via ORAL
  Administered 2012-02-13 – 2012-02-14 (×2): 2 via ORAL
  Administered 2012-02-14 (×2): 1 via ORAL
  Administered 2012-02-14 – 2012-02-20 (×14): 2 via ORAL
  Filled 2012-02-07 (×4): qty 2
  Filled 2012-02-07: qty 1
  Filled 2012-02-07 (×17): qty 2
  Filled 2012-02-07: qty 1
  Filled 2012-02-07 (×11): qty 2

## 2012-02-07 MED ORDER — MORPHINE SULFATE ER 30 MG PO TBCR
30.0000 mg | EXTENDED_RELEASE_TABLET | Freq: Every day | ORAL | Status: DC
  Administered 2012-02-07 – 2012-02-27 (×18): 30 mg via ORAL
  Filled 2012-02-07 (×4): qty 1
  Filled 2012-02-07: qty 2
  Filled 2012-02-07 (×2): qty 1
  Filled 2012-02-07 (×5): qty 2
  Filled 2012-02-07: qty 1
  Filled 2012-02-07: qty 2

## 2012-02-07 NOTE — Progress Notes (Signed)
02/07/12 Nursing 0733  Notified Dr. [name] of bumps to bilateral arms. Bumps had decreased to bilateral arms but the bumps have came back now.  Patient states that her bilateral arms are itching slightly.  Dr. [name] at bedside to assess patient. Will continue to monitor patient. Nelda Marseille, RN

## 2012-02-07 NOTE — Consult Note (Signed)
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 Education provided: Demonstrated one piece pouch application.  Pt able to open and close velcro to empty.  Educational materials left at bedside.  Recommend home health after discharge to assist with pouching.  Discussed pouching routines and ordering supplies.  Placed on [facility] discharge program.  Cammie Mcgee, RN, MSN, [employer]  [phone]

## 2012-02-07 NOTE — Progress Notes (Signed)
Ambulated patient 40 ft in hallway and to bathroom. Pt reported a lot of pain. Slight bleeding (several drops only) from one area of midline incision, but incision remained approximated and bleeding stopped with slight pressure. Pt has a watery, pinkish-brown BM and urinated slightly pink urine (or urine may have been tainted by pink BM - unable to tell.  Pt up to chair currently. Wants to return to bed asap.  Lysbeth Galas, RN

## 2012-02-07 NOTE — Progress Notes (Signed)
Patient ID: Vanessa [name], female   DOB: Dec 25, 1975, 36 y.o.   MRN: [id number] 3 Days Post-Op  Subjective: Pt feels ok this morning.  States she doesn't feel like she needs to urinate.  Only had 800cc yesterday.  Tachy overnight.  Had reaction to labetalol.  Passing bilious output in bag.  Hasn't ambulated yet!  Objective: Vital signs in last 24 hours: Temp:  [98 F (36.7 C)-99 F (37.2 C)] 98.2 F (36.8 C) (05/17 0559) Pulse Rate:  [108-150] 118  (05/17 0559) Resp:  [16-28] 20  (05/17 0559) BP: (101-126)/(68-88) 107/76 mmHg (05/17 0559) SpO2:  [96 %-100 %] 96 % (05/17 0559) Last BM Date: 02/03/12  Intake/Output from previous day: 05/16 0701 - 05/17 0700 In: 2331.3 [P.O.:462; I.V.:1369.3; IV Piggyback:500] Out: 800 [Urine:800] Intake/Output this shift:    PE: Abd: soft, tender, +BS, incision c/d/i, ostomy with bilious output.  Stoma is pink Heart: tachy, but regular Lungs: CTAB  Lab Results:   Basename 02/07/12 0614 02/06/12 2215  WBC 8.3 4.5  HGB 11.1* 11.1*  HCT 32.9* 32.6*  PLT 412* 360   BMET  Basename 02/06/12 0743 02/05/12 0554  NA 131* 134*  K 4.3 4.5  CL 99 99  CO2 26 25  GLUCOSE 129* 137*  BUN 6 6  CREATININE 0.55 0.62  CALCIUM 10.7* 10.8*   PT/INR No results found for this basename: LABPROT:2,INR:2 in the last 72 hours CMP     Component Value Date/Time   NA 131* 02/06/2012 0743   K 4.3 02/06/2012 0743   CL 99 02/06/2012 0743   CO2 26 02/06/2012 0743   GLUCOSE 129* 02/06/2012 0743   BUN 6 02/06/2012 0743   CREATININE 0.55 02/06/2012 0743   CALCIUM 10.7* 02/06/2012 0743   CALCIUM 10.7* 02/01/2012 0705   PROT 7.1 01/31/2012 0535   ALBUMIN 3.2* 01/31/2012 0535   AST 14 01/31/2012 0535   ALT 10 01/31/2012 0535   ALKPHOS 108 01/31/2012 0535   BILITOT 0.3 01/31/2012 0535   GFRNONAA >90 02/06/2012 0743   GFRAA >90 02/06/2012 0743   Lipase     Component Value Date/Time   LIPASE 16 11/13/2011 1007       Studies/Results: No results  found.  Anti-infectives: Anti-infectives     Start     Dose/Rate Route Frequency Ordered Stop   02/04/12 1800   cefOXitin (MEFOXIN) 1 g in dextrose 5 % 50 mL IVPB        1 g 100 mL/hr over 30 Minutes Intravenous Every 6 hours 02/04/12 1621 02/05/12 0617   02/04/12 0000   cefOXitin (MEFOXIN) 2 g in dextrose 5 % 50 mL IVPB        2 g 100 mL/hr over 30 Minutes Intravenous 60 min pre-op 02/03/12 0333 02/04/12 1145   02/03/12 0500   cefOXitin (MEFOXIN) 2 g in dextrose 5 % 50 mL IVPB  Status:  Discontinued        2 g 100 mL/hr over 30 Minutes Intravenous 60 min pre-op 02/02/12 1156 02/03/12 0333   02/03/12 0000   cefOXitin (MEFOXIN) 2 g in dextrose 5 % 50 mL IVPB  Status:  Discontinued        2 g 100 mL/hr over 30 Minutes Intravenous 60 min pre-op 02/02/12 1004 02/02/12 1156   01/27/12 2200   cefTRIAXone (ROCEPHIN) 1 g in dextrose 5 % 50 mL IVPB        1 g 100 mL/hr over 30 Minutes Intravenous Every 24 hours 01/27/12 2152 01/29/12  2359   01/27/12 1915   cefTRIAXone (ROCEPHIN) 1 g in dextrose 5 % 50 mL IVPB        1 g 100 mL/hr over 30 Minutes Intravenous  Once 01/27/12 1914 01/27/12 1957           Assessment/Plan  1. S/p ex lap with sigmoid colectomy, diverting ileostomy 2. Mild decreased UOP 3. H/o cervical cancer 4. Radiation proctitis with sigmoid stricture  Plan: 1. 1. Advance to full liquids 2. MUST ambulate! 3. Restart home pain regimen of MS contin 30 in am and 15 in the pm with percocet q 6 prn. 4. Dc pca 4. Restart HCTZ   LOS: 11 days    Naveen Lorusso E 02/07/2012

## 2012-02-07 NOTE — Progress Notes (Signed)
Patient still a bit tachycardic.  Had a reaction to the IV B-blocker.  HR 118 currently, only had 71 cc in bladder.  In spite of fluids, will decrease IVF for now.  Vanessa [name]. [name], MD, FACS [phone] [facility]

## 2012-02-07 NOTE — Progress Notes (Signed)
59 Dr. Lavella Lemons and Pennie Banter in to see patient  Made aware of rash to right arm

## 2012-02-07 NOTE — Progress Notes (Signed)
02/07/12 Nursing 0357 Notified Dr. Janee Morn that patient's WBC significantly dropped from 19.5 to 4.5. Told Dr. Janee Morn that patient's current vital signs are 98.9 124 24 105/72 100% 2L.  Dr. Janee Morn stated he would put in an order for CBC this am. Will continue to monitor patient. Nelda Marseille, RN

## 2012-02-07 NOTE — Progress Notes (Signed)
1800 Patient ambulated in room and bathroom today.Using [employer].

## 2012-02-07 NOTE — Progress Notes (Signed)
0800 Patient using her Incentive spirometry and coughing , deep breathing encouraged.

## 2012-02-08 LAB — BASIC METABOLIC PANEL
Calcium: 11.3 mg/dL — ABNORMAL HIGH (ref 8.4–10.5)
GFR calc Af Amer: >90 mL/min (ref >90)
GFR calc non Af Amer: >90 mL/min (ref >90)
Sodium: 132 mEq/L — ABNORMAL LOW (ref 135–145)

## 2012-02-08 NOTE — Progress Notes (Signed)
Patient ID: Vanessa [name], female   DOB: 1976/02/01, 36 y.o.   MRN: [id number] Sigmoid stricture  Subjective: Feels a little better - mild nausea, but taking liquids. Still not real good pain control but feels better than yesterday. No other c/o   Objective: Vital signs in last 24 hours: Temp:  [97.9 F (36.6 C)-99.1 F (37.3 C)] 99.1 F (37.3 C) (05/18 0515) Pulse Rate:  [124-127] 124  (05/18 0515) Resp:  [20-22] 20  (05/18 0515) BP: (109-121)/(70-85) 113/77 mmHg (05/18 0515) SpO2:  [92 %-96 %] 92 % (05/18 0515) Last BM Date: 02/08/12  Intake/Output from previous day: 05/17 0701 - 05/18 0700 In: 1160 [P.O.:60; I.V.:1100] Out: 1615 [Urine:975; Stool:640] Intake/Output this shift:    General appearance: alert, cooperative and no distress Resp: clear to auscultation bilaterally GI: soft, witl mild diffuse tenderness, but no guarding and no rebound. BS+ iliiostomy healthy looking with bilious output Incision/Wound: Clean, no evidence of infection   Lab Results:  Results for orders placed during the hospital encounter of 01/27/12 (from the past 24 hour(s))  BASIC METABOLIC PANEL     Status: Abnormal   Collection Time   02/08/12  5:00 AM      Component Value Range   Sodium 132 (*) 135 - 145 (mEq/L)   Potassium 3.9  3.5 - 5.1 (mEq/L)   Chloride 99  96 - 112 (mEq/L)   CO2 25  19 - 32 (mEq/L)   Glucose, Bld 94  70 - 99 (mg/dL)   BUN 14  6 - 23 (mg/dL)   Creatinine, Ser 4.09  0.50 - 1.10 (mg/dL)   Calcium 81.1 (*) 8.4 - 10.5 (mg/dL)   GFR calc non Af Amer >90  >90 (mL/min)   GFR calc Af Amer >90  >90 (mL/min)     Studies/Results Radiology     MEDS, Scheduled    . amLODipine  10 mg Oral Daily  . chlorhexidine  15 mL Mouth Rinse BID  . enoxaparin  40 mg Subcutaneous Q24H  . hydrochlorothiazide  12.5 mg Oral Daily  . ketorolac  30 mg Intravenous Q6H  . morphine  15 mg Oral QHS  . morphine  30 mg Oral Daily     Assessment: Sigmoid stricture Stable and  slowly progressing, mild persistent tachycardia, mild hyponatremia  Plan: She ambulated some yesterday and will need to do more today. Will recheck lytes in am due to hyponatremia. Wbc wnl  LOS: 12 days    Currie Paris, MD, [facility], [place] [phone]   02/08/2012 7:58 AM

## 2012-02-09 LAB — BASIC METABOLIC PANEL
GFR calc Af Amer: >90 mL/min (ref >90)
GFR calc non Af Amer: >90 mL/min (ref >90)
Potassium: 3.6 mEq/L (ref 3.5–5.1)
Sodium: 128 mEq/L — ABNORMAL LOW (ref 135–145)

## 2012-02-09 NOTE — Progress Notes (Signed)
5 Days Post-Op  Subjective: She feels a bit better today. She complains still of abdominal pain but no worse but no better. She's not having nausea and tolerating full liquids. Her ileostomy continues to work. She's been ambulating better. She would like solid foods.  Objective: Vital signs in last 24 hours: Temp:  [98.1 F (36.7 C)-99.6 F (37.6 C)] 98.1 F (36.7 C) (05/19 0611) Pulse Rate:  [107-126] 107  (05/19 0611) Resp:  [18] 18  (05/19 0611) BP: (115-138)/(75-86) 115/75 mmHg (05/19 0611) SpO2:  [96 %-98 %] 98 % (05/19 0611)   Intake/Output from previous day: 05/18 0701 - 05/19 0700 In: 2693.3 [P.O.:270; I.V.:2423.3] Out: 1450 [Urine:700; Stool:750] Intake/Output this shift:     General appearance: alert, cooperative and mild distress Resp: clear to auscultation bilaterally GI: the abdomen is soft, mild diffuse tenderness but no rebound. No guarding. Bowel sounds are present. Ileostomy output is bilious in nature.  Incision: healing well  Lab Results:   Basename 02/07/12 0614 02/06/12 2215  WBC 8.3 4.5  HGB 11.1* 11.1*  HCT 32.9* 32.6*  PLT 412* 360   BMET  Basename 02/09/12 0632 02/08/12 0500  NA 128* 132*  K 3.6 3.9  CL 95* 99  CO2 21 25  GLUCOSE 110* 94  BUN 11 14  CREATININE 0.41* 0.50  CALCIUM 10.7* 11.3*   PT/INR No results found for this basename: LABPROT:2,INR:2 in the last 72 hours ABG No results found for this basename: PHART:2,PCO2:2,PO2:2,HCO3:2 in the last 72 hours  MEDS, Scheduled    . amLODipine  10 mg Oral Daily  . chlorhexidine  15 mL Mouth Rinse BID  . enoxaparin  40 mg Subcutaneous Q24H  . hydrochlorothiazide  12.5 mg Oral Daily  . morphine  15 mg Oral QHS  . morphine  30 mg Oral Daily    Studies/Results: No results found.  Assessment: s/p Procedure(s): COLON RESECTION SIGMOID Mild hyponatremia  Otherwise seems to be progressing and we should able to advance diet. Tachycardia is persistent without obvious etiology.  However her rate is slower today than it has been. Abdominal tenderness persists but I. Notably she has any acute abdominal process  Plan: Advance diet we will recheck CBC and BMET in the morning.   LOS: 13 days     Currie Paris, MD, [facility], [place] [phone]   02/09/2012 7:59 AM

## 2012-02-09 NOTE — Progress Notes (Signed)
02/09/12 0358 NT reported that patient exhibited hematuria.  Patient nurse assessed patient with no blood seen on peri or rectal area.  Will continue to monitor patient for any active bleeding.  [name], Joan Mayans, RN

## 2012-02-09 NOTE — Plan of Care (Signed)
Problem: Discharge Progression Outcomes Goal: Discharge plan in place and appropriate Outcome: Completed/Met Date Met:  02/09/12 Return home when medically cleared

## 2012-02-09 NOTE — Plan of Care (Signed)
Problem: Discharge Progression Outcomes Goal: Pain controlled with appropriate interventions Outcome: Completed/Met Date Met:  02/09/12 See eMar Goal: Tolerating diet Outcome: Completed/Met Date Met:  02/09/12 Fat modified

## 2012-02-10 LAB — CBC
Hemoglobin: 9.6 g/dL — ABNORMAL LOW (ref 12.0–15.0)
Platelets: 259 10*3/uL (ref 150–400)
RBC: 3.19 MIL/uL — ABNORMAL LOW (ref 3.87–5.11)

## 2012-02-10 LAB — COMPREHENSIVE METABOLIC PANEL
ALT: 27 U/L (ref 0–35)
AST: 37 U/L (ref 0–37)
Alkaline Phosphatase: 83 U/L (ref 39–117)
CO2: 25 mEq/L (ref 19–32)
GFR calc Af Amer: >90 mL/min (ref >90)
GFR calc non Af Amer: >90 mL/min (ref >90)
Glucose, Bld: 102 mg/dL — ABNORMAL HIGH (ref 70–99)
Potassium: 3.5 mEq/L (ref 3.5–5.1)
Sodium: 128 mEq/L — ABNORMAL LOW (ref 135–145)
Total Protein: 5.7 g/dL — ABNORMAL LOW (ref 6.0–8.3)

## 2012-02-10 MED ORDER — AMOXICILLIN-POT CLAVULANATE 875-125 MG PO TABS
1.0000 | ORAL_TABLET | Freq: Two times a day (BID) | ORAL | Status: DC
  Administered 2012-02-10 – 2012-02-11 (×4): 1 via ORAL
  Filled 2012-02-10 (×7): qty 1

## 2012-02-10 MED ORDER — ADULT MULTIVITAMIN W/MINERALS CH
1.0000 | ORAL_TABLET | Freq: Every day | ORAL | Status: DC
  Administered 2012-02-10 – 2012-02-22 (×12): 1 via ORAL
  Filled 2012-02-10 (×14): qty 1

## 2012-02-10 MED ORDER — HYDROMORPHONE HCL PF 1 MG/ML IJ SOLN
0.5000 mg | INTRAMUSCULAR | Status: DC | PRN
  Administered 2012-02-10 – 2012-02-11 (×4): 1 mg via INTRAVENOUS
  Filled 2012-02-10 (×5): qty 1

## 2012-02-10 MED ORDER — ENSURE COMPLETE PO LIQD
237.0000 mL | Freq: Two times a day (BID) | ORAL | Status: DC
  Administered 2012-02-10 – 2012-02-17 (×6): 237 mL via ORAL

## 2012-02-10 NOTE — Progress Notes (Signed)
Patient teared up and refused attempts for ambulation during night shift.  Will continue to encourage patient to ambulate and pass on in report patient need for ambulation.  Alejandro Adcox, Joan Mayans, RN

## 2012-02-10 NOTE — Plan of Care (Signed)
Problem: Phase II Progression Outcomes Goal: Tolerating diet Outcome: Progressing Nausea at times

## 2012-02-10 NOTE — Consult Note (Signed)
Ostomy follow-up:  Pt with ileostomy pouch intact with good seal.  Stoma red and viable.  Large amt green watery liquid in pouch.  Plan another pouch change demonstration tomorrow.   Cammie Mcgee, RN, MSN, [employer]  [phone]

## 2012-02-10 NOTE — Progress Notes (Signed)
Nutrition Follow-up  Diet Order:  Fat Modified, PO intake 50% of last meal, RD observed lunch tray, only bites eaten.  Pt reports she is not hungry, nauseous and unable to eat. S/p lap ex with sig colectomy and diverting ileostomy.  Pt was NPO for 7 days prior to surgery and continues with poor intake after.  Pt is amenable to a daily multivitamin and Ensure.   Meds: Scheduled Meds:   . amLODipine  10 mg Oral Daily  . amoxicillin-clavulanate  1 tablet Oral Q12H  . chlorhexidine  15 mL Mouth Rinse BID  . enoxaparin  40 mg Subcutaneous Q24H  . hydrochlorothiazide  12.5 mg Oral Daily  . morphine  15 mg Oral QHS  . morphine  30 mg Oral Daily   Continuous Infusions:   . dextrose 5 % and 0.45 % NaCl with KCl 20 mEq/L 20 mL (02/10/12 1317)   PRN Meds:.acetaminophen, diphenhydrAMINE, HYDROmorphone (DILAUDID) injection, ondansetron (ZOFRAN) IV, ondansetron, oxyCODONE-acetaminophen, zolpidem, DISCONTD:  HYDROmorphone (DILAUDID) injection  Labs:  CMP     Component Value Date/Time   NA 128* 02/10/2012 0600   K 3.5 02/10/2012 0600   CL 93* 02/10/2012 0600   CO2 25 02/10/2012 0600   GLUCOSE 102* 02/10/2012 0600   BUN 8 02/10/2012 0600   CREATININE 0.37* 02/10/2012 0600   CALCIUM 10.2 02/10/2012 0600   CALCIUM 10.7* 02/01/2012 0705   PROT 5.7* 02/10/2012 0600   ALBUMIN 1.8* 02/10/2012 0600   AST 37 02/10/2012 0600   ALT 27 02/10/2012 0600   ALKPHOS 83 02/10/2012 0600   BILITOT 1.0 02/10/2012 0600   GFRNONAA >90 02/10/2012 0600   GFRAA >90 02/10/2012 0600     Intake/Output Summary (Last 24 hours) at 02/10/12 1401 Last data filed at 02/10/12 0900  Gross per 24 hour  Intake 3018.33 ml  Output   1550 ml  Net 1468.33 ml    Weight Status:  No recent weights obtained  Re-estimated needs: 1800-2000 kcal, 75-85 gm protein  Nutrition Dx:  Inadequate oral intake, ongoing  Goal: diet advance, met New goal: PO intake of meals and supplements to provide >90% of estimated nutrition  needs  Intervention:   1. Multivitamin 2. Ensure Complete BID 3. Recommend daily weights  4. RD will continue to follow  Monitor:  PO intake, weight, labs, I/Os   Vanessa [name] Pager #:  [phone]

## 2012-02-10 NOTE — Progress Notes (Signed)
Patient ID: Vanessa [name], female   DOB: [birth year], 36 y.o.   MRN: [id number] 6 Days Post-Op  Subjective: Pt c/o pain, but down to a 7.  Tolerating solid food with no nausea.   Objective: Vital signs in last 24 hours: Temp:  [98.5 F (36.9 C)-99.7 F (37.6 C)] 98.5 F (36.9 C) (05/20 0643) Pulse Rate:  [115-125] 115  (05/20 0643) Resp:  [18-20] 20  (05/20 0643) BP: (112-129)/(75-87) 129/87 mmHg (05/20 0643) SpO2:  [90 %-93 %] 93 % (05/20 0643) Last BM Date: 02/09/12  Intake/Output from previous day: 05/19 0701 - 05/20 0700 In: 2778.3 [P.O.:480; I.V.:2298.3] Out: 1500 [Urine:1200; Stool:300] Intake/Output this shift:    PE: Abd: soft, tender, +BS, ileostomy with good output.  Wound with some purulent appearing drainage.  Several staples removed from lower portion of wound.  Foul smelling purulent tan drainage was expressed.  A WD packing was placed in the wound.  Lab Results:   Basename 02/10/12 0600  WBC 12.2*  HGB 9.6*  HCT 27.1*  PLT 259   BMET  Basename 02/10/12 0600 02/09/12 0632  NA 128* 128*  K 3.5 3.6  CL 93* 95*  CO2 25 21  GLUCOSE 102* 110*  BUN 8 11  CREATININE 0.37* 0.41*  CALCIUM 10.2 10.7*   PT/INR No results found for this basename: LABPROT:2,INR:2 in the last 72 hours CMP     Component Value Date/Time   NA 128* 02/10/2012 0600   K 3.5 02/10/2012 0600   CL 93* 02/10/2012 0600   CO2 25 02/10/2012 0600   GLUCOSE 102* 02/10/2012 0600   BUN 8 02/10/2012 0600   CREATININE 0.37* 02/10/2012 0600   CALCIUM 10.2 02/10/2012 0600   CALCIUM 10.7* 02/01/2012 0705   PROT 5.7* 02/10/2012 0600   ALBUMIN 1.8* 02/10/2012 0600   AST 37 02/10/2012 0600   ALT 27 02/10/2012 0600   ALKPHOS 83 02/10/2012 0600   BILITOT 1.0 02/10/2012 0600   GFRNONAA >90 02/10/2012 0600   GFRAA >90 02/10/2012 0600   Lipase     Component Value Date/Time   LIPASE 16 11/13/2011 1007       Studies/Results: No results found.  Anti-infectives: Anti-infectives     Start      Dose/Rate Route Frequency Ordered Stop   02/04/12 1800   cefOXitin (MEFOXIN) 1 g in dextrose 5 % 50 mL IVPB        1 g 100 mL/hr over 30 Minutes Intravenous Every 6 hours 02/04/12 1621 02/05/12 0617   02/04/12 0000   cefOXitin (MEFOXIN) 2 g in dextrose 5 % 50 mL IVPB        2 g 100 mL/hr over 30 Minutes Intravenous 60 min pre-op 02/03/12 0333 02/04/12 1145   02/03/12 0500   cefOXitin (MEFOXIN) 2 g in dextrose 5 % 50 mL IVPB  Status:  Discontinued        2 g 100 mL/hr over 30 Minutes Intravenous 60 min pre-op 02/02/12 1156 02/03/12 0333   02/03/12 0000   cefOXitin (MEFOXIN) 2 g in dextrose 5 % 50 mL IVPB  Status:  Discontinued        2 g 100 mL/hr over 30 Minutes Intravenous 60 min pre-op 02/02/12 1004 02/02/12 1156   01/27/12 2200   cefTRIAXone (ROCEPHIN) 1 g in dextrose 5 % 50 mL IVPB        1 g 100 mL/hr over 30 Minutes Intravenous Every 24 hours 01/27/12 2152 01/29/12 2359   01/27/12 1915   cefTRIAXone (  ROCEPHIN) 1 g in dextrose 5 % 50 mL IVPB        1 g 100 mL/hr over 30 Minutes Intravenous  Once 01/27/12 1914 01/27/12 1957           Assessment/Plan  1. S/p ex lap with sig colectomy and diverting ileostomy 2. Wound infection  Plan: 1. Difficult to control pain.  She is taking MS contin, percocet prn, and prn dilaudid.  I have dropped dilaudid to 0.5-1mg  q 4 hrs and will try to wean off of this. 2. I opened part of her wound where she was noted to have a wound infection.  Will likely start an abx today as her WBC went up slightly as well. 3. Cont regular diet    LOS: 14 days    Shandora Koogler E 02/10/2012

## 2012-02-11 LAB — CBC
HCT: 26.8 % — ABNORMAL LOW (ref 36.0–46.0)
MCH: 29.4 pg (ref 26.0–34.0)
MCHC: 35.4 g/dL (ref 30.0–36.0)
RDW: 14 % (ref 11.5–15.5)

## 2012-02-11 MED ORDER — HYDROMORPHONE HCL PF 1 MG/ML IJ SOLN
0.5000 mg | INTRAMUSCULAR | Status: DC | PRN
  Administered 2012-02-11 – 2012-02-20 (×38): 0.5 mg via INTRAVENOUS
  Filled 2012-02-11 (×38): qty 1

## 2012-02-11 MED ORDER — ALUM & MAG HYDROXIDE-SIMETH 200-200-20 MG/5ML PO SUSP
30.0000 mL | ORAL | Status: DC | PRN
  Administered 2012-02-11: 30 mL via ORAL
  Filled 2012-02-11 (×2): qty 30

## 2012-02-11 NOTE — Progress Notes (Addendum)
Gave the pt pain medication and attempted to walk with her at 2200. She refused at the time saying later tonight she would. Attempted again to walk at 0600 and pt refused again. We did get her to sit in the chair. Did incentive spirometer and reached 1500 ml while sitting in the chair!!!!

## 2012-02-11 NOTE — Progress Notes (Signed)
Patient ID: Vanessa [name], female   DOB: 1975-11-13, 36 y.o.   MRN: [id number] 7 Days Post-Op  Subjective: Pt actually feels about the same today.  Her pain is no worse.  She appears comfortable and sleeping in her chair.  Tolerating a regular diet  Objective: Vital signs in last 24 hours: Temp:  [98.7 F (37.1 C)-99.2 F (37.3 C)] 98.7 F (37.1 C) (05/21 0536) Pulse Rate:  [108-114] 110  (05/21 0536) Resp:  [18-20] 20  (05/21 0536) BP: (131-139)/(89-93) 131/89 mmHg (05/21 0536) SpO2:  [90 %-93 %] 93 % (05/21 0536) Last BM Date: 02/10/12  Intake/Output from previous day: 05/20 0701 - 05/21 0700 In: 1644.7 [P.O.:600; I.V.:1042.7; IV Piggyback:2] Out: 2075 [Urine:1475; Stool:600] Intake/Output this shift:    PE: Abd: soft, much less tender, +BS, ostomy working appropriately with bilious output. ND, wound is much cleaner today after starting dressing changes  Lab Results:   Basename 02/11/12 0604 02/10/12 0600  WBC 18.4* 12.2*  HGB 9.5* 9.6*  HCT 26.8* 27.1*  PLT 179 259   BMET  Basename 02/10/12 0600 02/09/12 0632  NA 128* 128*  K 3.5 3.6  CL 93* 95*  CO2 25 21  GLUCOSE 102* 110*  BUN 8 11  CREATININE 0.37* 0.41*  CALCIUM 10.2 10.7*   PT/INR No results found for this basename: LABPROT:2,INR:2 in the last 72 hours CMP     Component Value Date/Time   NA 128* 02/10/2012 0600   K 3.5 02/10/2012 0600   CL 93* 02/10/2012 0600   CO2 25 02/10/2012 0600   GLUCOSE 102* 02/10/2012 0600   BUN 8 02/10/2012 0600   CREATININE 0.37* 02/10/2012 0600   CALCIUM 10.2 02/10/2012 0600   CALCIUM 10.7* 02/01/2012 0705   PROT 5.7* 02/10/2012 0600   ALBUMIN 1.8* 02/10/2012 0600   AST 37 02/10/2012 0600   ALT 27 02/10/2012 0600   ALKPHOS 83 02/10/2012 0600   BILITOT 1.0 02/10/2012 0600   GFRNONAA >90 02/10/2012 0600   GFRAA >90 02/10/2012 0600   Lipase     Component Value Date/Time   LIPASE 16 11/13/2011 1007       Studies/Results: No results  found.  Anti-infectives: Anti-infectives     Start     Dose/Rate Route Frequency Ordered Stop   02/10/12 1000   amoxicillin-clavulanate (AUGMENTIN) 875-125 MG per tablet 1 tablet        1 tablet Oral Every 12 hours 02/10/12 0823     02/04/12 1800   cefOXitin (MEFOXIN) 1 g in dextrose 5 % 50 mL IVPB        1 g 100 mL/hr over 30 Minutes Intravenous Every 6 hours 02/04/12 1621 02/05/12 0617   02/04/12 0000   cefOXitin (MEFOXIN) 2 g in dextrose 5 % 50 mL IVPB        2 g 100 mL/hr over 30 Minutes Intravenous 60 min pre-op 02/03/12 0333 02/04/12 1145   02/03/12 0500   cefOXitin (MEFOXIN) 2 g in dextrose 5 % 50 mL IVPB  Status:  Discontinued        2 g 100 mL/hr over 30 Minutes Intravenous 60 min pre-op 02/02/12 1156 02/03/12 0333   02/03/12 0000   cefOXitin (MEFOXIN) 2 g in dextrose 5 % 50 mL IVPB  Status:  Discontinued        2 g 100 mL/hr over 30 Minutes Intravenous 60 min pre-op 02/02/12 1004 02/02/12 1156   01/27/12 2200   cefTRIAXone (ROCEPHIN) 1 g in dextrose 5 % 50  mL IVPB        1 g 100 mL/hr over 30 Minutes Intravenous Every 24 hours 01/27/12 2152 01/29/12 2359   01/27/12 1915   cefTRIAXone (ROCEPHIN) 1 g in dextrose 5 % 50 mL IVPB        1 g 100 mL/hr over 30 Minutes Intravenous  Once 01/27/12 1914 01/27/12 1957           Assessment/Plan  1. S/p ex lap with sig colectomy and ileostomy 2. leukocytosis 3. Tachycardia  Plan: 1. Will recheck WBC in am, if still up will get a CT scan to rule out a complication.  D/w patient. 2. Cont po Augmentin, may need to change depending on what labs do 3. Cont regular diet 4. Cont to try and wean iv pain meds  LOS: 15 days    Dreanna Kyllo E 02/11/2012

## 2012-02-11 NOTE — Consult Note (Signed)
Ostomy follow-up:  Pouch intact with good seal.  Plan to demonstrate pouch change again tomorrow.  Supplies at bedside for staff use if leakage occurs prior to that time.  Cammie Mcgee, RN, MSN, [employer]  [phone]

## 2012-02-11 NOTE — Progress Notes (Signed)
Attempted to get pt to ambulate in the hallway 4 times today, but pt refused every time.

## 2012-02-12 ENCOUNTER — Encounter (HOSPITAL_COMMUNITY): Payer: Self-pay | Admitting: Radiology

## 2012-02-12 ENCOUNTER — Inpatient Hospital Stay (HOSPITAL_COMMUNITY): Payer: Medicaid Other

## 2012-02-12 LAB — BASIC METABOLIC PANEL
CO2: 28 mEq/L (ref 19–32)
Calcium: 9.9 mg/dL (ref 8.4–10.5)
Creatinine, Ser: 0.31 mg/dL — ABNORMAL LOW (ref 0.50–1.10)
GFR calc Af Amer: >90 mL/min (ref >90)
GFR calc non Af Amer: >90 mL/min (ref >90)
Sodium: 126 mEq/L — ABNORMAL LOW (ref 135–145)

## 2012-02-12 LAB — CBC
MCH: 29.6 pg (ref 26.0–34.0)
MCV: 82.5 fL (ref 78.0–100.0)
Platelets: 153 10*3/uL (ref 150–400)
RBC: 3.14 MIL/uL — ABNORMAL LOW (ref 3.87–5.11)
RDW: 14 % (ref 11.5–15.5)

## 2012-02-12 MED ORDER — PIPERACILLIN-TAZOBACTAM 3.375 G IVPB
3.3750 g | Freq: Three times a day (TID) | INTRAVENOUS | Status: DC
  Administered 2012-02-12 – 2012-02-19 (×23): 3.375 g via INTRAVENOUS
  Filled 2012-02-12 (×25): qty 50

## 2012-02-12 MED ORDER — IOHEXOL 300 MG/ML  SOLN
20.0000 mL | INTRAMUSCULAR | Status: AC
  Administered 2012-02-12 (×2): 20 mL via ORAL

## 2012-02-12 MED ORDER — IOHEXOL 300 MG/ML  SOLN
80.0000 mL | Freq: Once | INTRAMUSCULAR | Status: AC | PRN
  Administered 2012-02-12: 80 mL via INTRAVENOUS

## 2012-02-12 NOTE — Consult Note (Signed)
WOC ostomy consult  Stoma type/location: Right upper quad abdomen/ileostomy Stomal assessment/size: 1 1/4 inches, raised slightly above skin level Peristomal assessment: Intact without redness Output 50 cc Ostomy pouching: 2pc. However, patient prefers to use 1 piece pouching system at home. Education provided: Demonstration of pouch change and application.  Allowed patient to cut out ostomy barrier for application.  Performed well, complains of weakness in hands but no significant difficulties noted.  Education regarding diet, medication and pouching routines.  Placed on [employer] program for free supplies. Recommend home health for continued education and pouching changes.   CCS following for assessment and plan of care for abdominal wound.   Reynold Bowen RN, BSN, WOC Nurse/ [place], Charity fundraiser, MSN, [employer]  [phone]

## 2012-02-12 NOTE — Progress Notes (Signed)
Patient ID: Vanessa [name], female   DOB: 1976/07/14, 36 y.o.   MRN: [id number] 8 Days Post-Op  Subjective: Patient is tearful and c/o pain.  Feels like she is having reflux.  Doesn't want solid food.  Objective: Vital signs in last 24 hours: Temp:  [98.6 F (37 C)-99.3 F (37.4 C)] 99.3 F (37.4 C) (05/22 0500) Pulse Rate:  [98-107] 98  (05/22 0500) Resp:  [18-20] 19  (05/22 0500) BP: (127-138)/(85-93) 128/86 mmHg (05/22 0500) SpO2:  [92 %-98 %] 94 % (05/22 0500) Last BM Date: 02/11/12  Intake/Output from previous day: 05/21 0701 - 05/22 0700 In: 520 [P.O.:240; I.V.:280] Out: 600 [Stool:600] Intake/Output this shift:    PE: Abd: soft, tender, ND, incision is clean, but very foul smelling.  Smells of stool, but no stool drainage seen.  Ostomy is working well with good output and stoma is pink. Heart: regular  Lab Results:   Basename 02/12/12 0552 02/11/12 0604  WBC 19.2* 18.4*  HGB 9.3* 9.5*  HCT 25.9* 26.8*  PLT 153 179   BMET  Basename 02/12/12 0552 02/10/12 0600  NA 126* 128*  K 3.1* 3.5  CL 89* 93*  CO2 28 25  GLUCOSE 86 102*  BUN 8 8  CREATININE 0.31* 0.37*  CALCIUM 9.9 10.2   PT/INR No results found for this basename: LABPROT:2,INR:2 in the last 72 hours CMP     Component Value Date/Time   NA 126* 02/12/2012 0552   K 3.1* 02/12/2012 0552   CL 89* 02/12/2012 0552   CO2 28 02/12/2012 0552   GLUCOSE 86 02/12/2012 0552   BUN 8 02/12/2012 0552   CREATININE 0.31* 02/12/2012 0552   CALCIUM 9.9 02/12/2012 0552   CALCIUM 10.7* 02/01/2012 0705   PROT 5.7* 02/10/2012 0600   ALBUMIN 1.8* 02/10/2012 0600   AST 37 02/10/2012 0600   ALT 27 02/10/2012 0600   ALKPHOS 83 02/10/2012 0600   BILITOT 1.0 02/10/2012 0600   GFRNONAA >90 02/12/2012 0552   GFRAA >90 02/12/2012 0552   Lipase     Component Value Date/Time   LIPASE 16 11/13/2011 1007       Studies/Results: No results found.  Anti-infectives: Anti-infectives     Start     Dose/Rate Route Frequency Ordered  Stop   02/12/12 0800  piperacillin-tazobactam (ZOSYN) IVPB 3.375 g       3.375 g 12.5 mL/hr over 240 Minutes Intravenous 3 times per day 02/12/12 0800     02/10/12 1000   amoxicillin-clavulanate (AUGMENTIN) 875-125 MG per tablet 1 tablet  Status:  Discontinued        1 tablet Oral Every 12 hours 02/10/12 0823 02/12/12 0800   02/04/12 1800   cefOXitin (MEFOXIN) 1 g in dextrose 5 % 50 mL IVPB        1 g 100 mL/hr over 30 Minutes Intravenous Every 6 hours 02/04/12 1621 02/05/12 0617   02/04/12 0000   cefOXitin (MEFOXIN) 2 g in dextrose 5 % 50 mL IVPB        2 g 100 mL/hr over 30 Minutes Intravenous 60 min pre-op 02/03/12 0333 02/04/12 1145   02/03/12 0500   cefOXitin (MEFOXIN) 2 g in dextrose 5 % 50 mL IVPB  Status:  Discontinued        2 g 100 mL/hr over 30 Minutes Intravenous 60 min pre-op 02/02/12 1156 02/03/12 0333   02/03/12 0000   cefOXitin (MEFOXIN) 2 g in dextrose 5 % 50 mL IVPB  Status:  Discontinued  2 g 100 mL/hr over 30 Minutes Intravenous 60 min pre-op 02/02/12 1004 02/02/12 1156   01/27/12 2200   cefTRIAXone (ROCEPHIN) 1 g in dextrose 5 % 50 mL IVPB        1 g 100 mL/hr over 30 Minutes Intravenous Every 24 hours 01/27/12 2152 01/29/12 2359   01/27/12 1915   cefTRIAXone (ROCEPHIN) 1 g in dextrose 5 % 50 mL IVPB        1 g 100 mL/hr over 30 Minutes Intravenous  Once 01/27/12 1914 01/27/12 1957           Assessment/Plan  1. S/p sig colectomy with diverting ileostomy 2. Leukocytosis, ? Abdominal abscess vs anastomotic leak  Plan: 1. Will get a CT scan today 2. Stop po augmentin and change to IV zosyn 3. Decrease to full liquids per her request 4. Pt has no motivation to get OOB and ambulate because it "hurts" 5. Recheck labs in the morning.   LOS: 16 days    Donis Kotowski E 02/12/2012

## 2012-02-12 NOTE — Progress Notes (Signed)
Attempted to get pt to ambulate in the hallway multiple times today, pt refused.

## 2012-02-13 ENCOUNTER — Ambulatory Visit: Payer: Medicaid Other | Admitting: Radiation Oncology

## 2012-02-13 ENCOUNTER — Ambulatory Visit: Payer: Medicaid Other

## 2012-02-13 ENCOUNTER — Inpatient Hospital Stay (HOSPITAL_COMMUNITY): Payer: Medicaid Other

## 2012-02-13 ENCOUNTER — Encounter (HOSPITAL_COMMUNITY): Payer: Self-pay | Admitting: Radiology

## 2012-02-13 LAB — BASIC METABOLIC PANEL
Chloride: 87 mEq/L — ABNORMAL LOW (ref 96–112)
GFR calc Af Amer: >90 mL/min (ref >90)
GFR calc non Af Amer: >90 mL/min (ref >90)
Potassium: 3.1 mEq/L — ABNORMAL LOW (ref 3.5–5.1)
Sodium: 125 mEq/L — ABNORMAL LOW (ref 135–145)

## 2012-02-13 LAB — CBC
HCT: 25.1 % — ABNORMAL LOW (ref 36.0–46.0)
Platelets: 186 10*3/uL (ref 150–400)
RDW: 14.1 % (ref 11.5–15.5)
WBC: 16.8 10*3/uL — ABNORMAL HIGH (ref 4.0–10.5)

## 2012-02-13 LAB — APTT: aPTT: 35 seconds (ref 24–37)

## 2012-02-13 LAB — PROTIME-INR: INR: 1.27 (ref 0.00–1.49)

## 2012-02-13 MED ORDER — FENTANYL CITRATE 0.05 MG/ML IJ SOLN
INTRAMUSCULAR | Status: AC
  Filled 2012-02-13: qty 4

## 2012-02-13 MED ORDER — MIDAZOLAM HCL 5 MG/5ML IJ SOLN
INTRAMUSCULAR | Status: AC | PRN
  Administered 2012-02-13: 0.5 mg via INTRAVENOUS
  Administered 2012-02-13: 1 mg via INTRAVENOUS
  Administered 2012-02-13: 2 mg via INTRAVENOUS
  Administered 2012-02-13 (×4): 1 mg via INTRAVENOUS
  Administered 2012-02-13: 0.5 mg via INTRAVENOUS

## 2012-02-13 MED ORDER — MIDAZOLAM HCL 2 MG/2ML IJ SOLN
INTRAMUSCULAR | Status: AC
  Filled 2012-02-13: qty 6

## 2012-02-13 MED ORDER — WHITE PETROLATUM GEL
Status: AC
  Administered 2012-02-13: 04:00:00
  Filled 2012-02-13: qty 5

## 2012-02-13 MED ORDER — FENTANYL CITRATE 0.05 MG/ML IJ SOLN
INTRAMUSCULAR | Status: AC
  Filled 2012-02-13: qty 6

## 2012-02-13 MED ORDER — FENTANYL CITRATE 0.05 MG/ML IJ SOLN
INTRAMUSCULAR | Status: AC | PRN
  Administered 2012-02-13 (×3): 50 ug via INTRAVENOUS
  Administered 2012-02-13: 25 ug via INTRAVENOUS
  Administered 2012-02-13: 50 ug via INTRAVENOUS
  Administered 2012-02-13 (×2): 25 ug via INTRAVENOUS
  Administered 2012-02-13: 50 ug via INTRAVENOUS

## 2012-02-13 MED ORDER — MIDAZOLAM HCL 2 MG/2ML IJ SOLN
INTRAMUSCULAR | Status: AC
  Filled 2012-02-13: qty 4

## 2012-02-13 NOTE — H&P (Signed)
Chief Complaint: Abdominal abscesses HPI: Vanessa [name] is an 36 y.o. female with hx of cervical cancer and prior radiation tx who developed sigmoid stricture. She underwent sigmoid colectomy with primary anastomosis and diverting ileostomy. However, she subsequently developed multiple intra-abdominal abscesses likely from an anastomotic leak as suggested by CT. Request is made for perc drainage of these abscesses.  Past Medical History:  Past Medical History  Diagnosis Date  . Hypertension   . Cervical cancer     Past Surgical History:  Past Surgical History  Procedure Date  . Cholecystectomy   . Radiation implants   . Colonoscopy 01/30/2012    Procedure: COLONOSCOPY;  Surgeon: Beverley Fiedler, MD;  Location: [facility] ENDOSCOPY;  Service: Gastroenterology;  Laterality: N/A;  . Colonoscopy 01/30/2012    Procedure: COLONOSCOPY;  Surgeon: Beverley Fiedler, MD;  Location: [facility] OR;  Service: Gastroenterology;  Laterality: N/A;  . Colostomy revision 02/04/2012    Procedure: COLON RESECTION SIGMOID;  Surgeon: Cherylynn Ridges, MD;  Location: [facility] OR;  Service: General;  Laterality: N/A;    Family History: History reviewed. No pertinent family history.  Social History:  reports that she has been smoking.  She does not have any smokeless tobacco history on file. She reports that she does not drink alcohol or use illicit drugs.  Allergies:  Allergies  Allergen Reactions  . Labetalol Hcl Itching and Other (See Comments)    Bumps to bilateral arms.  . Morphine And Related Hives    Medications: Medications Prior to Admission  Medication Sig Dispense Refill  . hydrochlorothiazide (HYDRODIURIL) 12.5 MG tablet Take 12.5 mg by mouth daily.      Marland Kitchen ibuprofen (ADVIL,MOTRIN) 800 MG tablet Take 800 mg by mouth 3 (three) times daily.      . ondansetron (ZOFRAN) 4 MG tablet Take 4 mg by mouth every 6 (six) hours as needed. For nausea.      Marland Kitchen oxyCODONE-acetaminophen (PERCOCET) 5-325 MG per tablet Take 1-2 tablets by  mouth every 6 (six) hours as needed. For pain.      Marland Kitchen DISCONTD: cephALEXin (KEFLEX) 500 MG capsule Take 500 mg by mouth 4 (four) times daily. Started today and ends on the 16th.      . DISCONTD: morphine (MS CONTIN) 15 MG 12 hr tablet Take 15-30 mg by mouth 2 (two) times daily. 15 mg every morning and 30 mg at night        Please HPI for pertinent positives, otherwise complete 10 system ROS negative.  Physical Exam: Blood pressure 112/75, pulse 107, temperature 98.9 F (37.2 C), temperature source Oral, resp. rate 18, height 5\' 4"  (1.626 m), weight 161 lb 9.6 oz (73.3 kg), SpO2 91.00%. Body mass index is 27.74 kg/(m^2).   General Appearance:  Alert, cooperative, no distress, appears stated age  Head:  Normocephalic, without obvious abnormality, atraumatic  ENT: Unremarkable  Neck: Supple, symmetrical, trachea midline, no adenopathy, thyroid: not enlarged, symmetric, no tenderness/mass/nodules  Lungs:   Clear to auscultation bilaterally, no w/r/r, respirations unlabored without use of accessory muscles.  Chest Wall:  No tenderness or deformity  Heart:  Regular rate and rhythm, S1, S2 normal, no murmur, rub or gallop. Carotids 2+ without bruit.  Abdomen:   Midline incision. (R)abd ileostomy. (L)lateral abdominal fullness noted, tender.  Pulses: 2+ and symmetric  Skin: Skin color, texture, turgor normal, no rashes or lesions  Neurologic: Normal affect, no gross deficits.   Results for orders placed during the hospital encounter of 01/27/12 (from the past  48 hour(s))  CBC     Status: Abnormal   Collection Time   02/12/12  5:52 AM      Component Value Range Comment   WBC 19.2 (*) 4.0 - 10.5 (K/uL)    RBC 3.14 (*) 3.87 - 5.11 (MIL/uL)    Hemoglobin 9.3 (*) 12.0 - 15.0 (g/dL)    HCT 18.8 (*) 41.6 - 46.0 (%)    MCV 82.5  78.0 - 100.0 (fL)    MCH 29.6  26.0 - 34.0 (pg)    MCHC 35.9  30.0 - 36.0 (g/dL)    RDW 60.6  30.1 - 60.1 (%)    Platelets 153  150 - 400 (K/uL)   BASIC METABOLIC PANEL      Status: Abnormal   Collection Time   02/12/12  5:52 AM      Component Value Range Comment   Sodium 126 (*) 135 - 145 (mEq/L)    Potassium 3.1 (*) 3.5 - 5.1 (mEq/L)    Chloride 89 (*) 96 - 112 (mEq/L)    CO2 28  19 - 32 (mEq/L)    Glucose, Bld 86  70 - 99 (mg/dL)    BUN 8  6 - 23 (mg/dL)    Creatinine, Ser 0.93 (*) 0.50 - 1.10 (mg/dL)    Calcium 9.9  8.4 - 10.5 (mg/dL)    GFR calc non Af Amer >90  >90 (mL/min)    GFR calc Af Amer >90  >90 (mL/min)   CBC     Status: Abnormal   Collection Time   02/13/12  5:46 AM      Component Value Range Comment   WBC 16.8 (*) 4.0 - 10.5 (K/uL) WHITE COUNT CONFIRMED ON SMEAR   RBC 3.02 (*) 3.87 - 5.11 (MIL/uL)    Hemoglobin 8.9 (*) 12.0 - 15.0 (g/dL)    HCT 23.5 (*) 57.3 - 46.0 (%)    MCV 83.1  78.0 - 100.0 (fL)    MCH 29.5  26.0 - 34.0 (pg)    MCHC 35.5  30.0 - 36.0 (g/dL)    RDW 22.0  25.4 - 27.0 (%)    Platelets 186  150 - 400 (K/uL)   BASIC METABOLIC PANEL     Status: Abnormal   Collection Time   02/13/12  5:46 AM      Component Value Range Comment   Sodium 125 (*) 135 - 145 (mEq/L)    Potassium 3.1 (*) 3.5 - 5.1 (mEq/L)    Chloride 87 (*) 96 - 112 (mEq/L)    CO2 25  19 - 32 (mEq/L)    Glucose, Bld 97  70 - 99 (mg/dL)    BUN 6  6 - 23 (mg/dL)    Creatinine, Ser 6.23 (*) 0.50 - 1.10 (mg/dL)    Calcium 9.7  8.4 - 10.5 (mg/dL)    GFR calc non Af Amer >90  >90 (mL/min)    GFR calc Af Amer >90  >90 (mL/min)    Ct Abdomen Pelvis W Contrast  02/12/2012  *RADIOLOGY REPORT*  Clinical Data: Upper abdominal pain.  Leukocytosis. 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  CT ABDOMEN AND PELVIS WITH CONTRAST  Technique:  Multidetector CT imaging of the abdomen and pelvis was performed following the standard protocol during bolus administration of intravenous contrast.  Contrast: 80mL OMNIPAQUE IOHEXOL 300 MG/ML  SOLN  Comparison: Multiple exams,  including 02/02/2012 and 01/28/2012  Findings: Trace  bilateral pleural effusions are present along with mild atelectasis in both lower lobes.  There is a small amount of free peritoneal gas.  Mild perihepatic ascites noted.  Along the inferior margin of the liver, along the margin of the spleen, and in the left abdomen the intra-abdominal fluid collections appeared have enhancing margins suggesting early loculation.  There are also small locules of gas and fluid along the anterior margin the retroperitoneum, for example on image 54 of series 2.  In the left abdomen, a fluid collection with enhancing margins is present along the margin of the descending colon, with speckled gas in the vicinity of the junction of the descending colon and sigmoid colon suspicious for either mean of pneumatosis or pericolic loculations of extraluminal gas.  These irregular loculations extend down to the end-to-end anastomosis with the rectal stump.  The appearance favors numerous loculated abscesses in the pelvis.  Nondistended ileostomy noted, although there are dilated and somewhat irregular loops of small bowel in the right abdomen.  Mild retroperitoneal adenopathy noted.  Presacral and perirectal edema noted.  A small pericardial effusion is present.  Incidental note is made of some asymmetry of glandular tissue in the left outer breast; I do not observe this asymmetry on the prior PET CT of 08/06/2010, and although this may be incidental I recommend careful correlation with breast examination and a low threshold for follow-up breast ultrasound.  The laparotomy site noted, slightly open along the scan inferiorly.  No specific parenchymal abnormality of the liver, spleen, pancreas, adrenal glands, or kidneys noted.  IMPRESSION:  1.  Suspected breakdown at the rectosigmoid anastomoses, with free intraperitoneal gas, scattered pelvic and upper abdominal abscesses, and irregular gas tracking along the margins of the sigmoid colon  (probably extraluminal but possibly intramural). No portal venous gas observed. 2.  Asymmetric soft tissue density in the outer half of the left breast.  Although possibly incidental, correlation with breast physical exam is recommended in determining the need for breast ultrasound to rule out breast cancer. 3.  Dilated proximal small bowel in the right abdomen, likely from ileus.  4.  Mild retroperitoneal adenopathy is highly likely to be reactive. 5.  Trace bilateral pleural effusions with small pericardial effusion.  Attempts to reach Barnetta Chapel, [place] with [facility] starting at 3:22 p.m. on 02/12/2012 were unsuccessful. I subsequently learned from her office that she is currently not on duty. I discussed the findings regarding the suspected breakdown of the rectosigmoid anastomoses and abscesses with the on-call surgeon, Dr. Wenda Low, at 4:10 p.m. on 02/12/2012.  Original Report Authenticated By: Dellia Cloud, M.D.    Assessment/Plan Intra-abdominal abscesses -suprapubic, RUQ, LUQ S/p sigmoid colectomy with suspected anastomotic breakdown. Discussed with pt CT guided aspiration/drain placement of these abscesses, possibly requiring 3 drains. Procedure discussed including risks and complications. Will check stat coags. NPO, no more Lovenox Consent signed.  Brayton El PA-C 02/13/2012, 9:38 AM

## 2012-02-13 NOTE — Progress Notes (Signed)
Patient ID: Vanessa [name], female   DOB: 1976/02/15, 36 y.o.   MRN: [id number] [facility] Surgery Progress Note:   9 Days Post-Op  Subjective: Mental status is clear.  Trying to take ice cream PO.   Objective: Vital signs in last 24 hours: Temp:  [98.1 F (36.7 C)-99.9 F (37.7 C)] 98.9 F (37.2 C) (05/23 0605) Pulse Rate:  [87-107] 107  (05/23 0605) Resp:  [18-22] 18  (05/23 0605) BP: (112-134)/(75-86) 112/75 mmHg (05/23 0605) SpO2:  [91 %-95 %] 91 % (05/23 0605)  Intake/Output from previous day: 05/22 0701 - 05/23 0700 In: 1853.5 [P.O.:480; I.V.:1173.5; IV Piggyback:200] Out: 3200 [Urine:2950; Stool:250] Intake/Output this shift:    Physical Exam: Work of breathing is  Normal.  Abdominal pain persistant.    Lab Results:  Results for orders placed during the hospital encounter of 01/27/12 (from the past 48 hour(s))  CBC     Status: Abnormal   Collection Time   02/12/12  5:52 AM      Component Value Range Comment   WBC 19.2 (*) 4.0 - 10.5 (K/uL)    RBC 3.14 (*) 3.87 - 5.11 (MIL/uL)    Hemoglobin 9.3 (*) 12.0 - 15.0 (g/dL)    HCT 40.9 (*) 81.1 - 46.0 (%)    MCV 82.5  78.0 - 100.0 (fL)    MCH 29.6  26.0 - 34.0 (pg)    MCHC 35.9  30.0 - 36.0 (g/dL)    RDW 91.4  78.2 - 95.6 (%)    Platelets 153  150 - 400 (K/uL)   BASIC METABOLIC PANEL     Status: Abnormal   Collection Time   02/12/12  5:52 AM      Component Value Range Comment   Sodium 126 (*) 135 - 145 (mEq/L)    Potassium 3.1 (*) 3.5 - 5.1 (mEq/L)    Chloride 89 (*) 96 - 112 (mEq/L)    CO2 28  19 - 32 (mEq/L)    Glucose, Bld 86  70 - 99 (mg/dL)    BUN 8  6 - 23 (mg/dL)    Creatinine, Ser 2.13 (*) 0.50 - 1.10 (mg/dL)    Calcium 9.9  8.4 - 10.5 (mg/dL)    GFR calc non Af Amer >90  >90 (mL/min)    GFR calc Af Amer >90  >90 (mL/min)   CBC     Status: Abnormal   Collection Time   02/13/12  5:46 AM      Component Value Range Comment   WBC 16.8 (*) 4.0 - 10.5 (K/uL) WHITE COUNT CONFIRMED ON SMEAR   RBC 3.02 (*)  3.87 - 5.11 (MIL/uL)    Hemoglobin 8.9 (*) 12.0 - 15.0 (g/dL)    HCT 08.6 (*) 57.8 - 46.0 (%)    MCV 83.1  78.0 - 100.0 (fL)    MCH 29.5  26.0 - 34.0 (pg)    MCHC 35.5  30.0 - 36.0 (g/dL)    RDW 46.9  62.9 - 52.8 (%)    Platelets 186  150 - 400 (K/uL)   BASIC METABOLIC PANEL     Status: Abnormal   Collection Time   02/13/12  5:46 AM      Component Value Range Comment   Sodium 125 (*) 135 - 145 (mEq/L)    Potassium 3.1 (*) 3.5 - 5.1 (mEq/L)    Chloride 87 (*) 96 - 112 (mEq/L)    CO2 25  19 - 32 (mEq/L)    Glucose, Bld 97  70 - 99 (mg/dL)    BUN 6  6 - 23 (mg/dL)    Creatinine, Ser 1.61 (*) 0.50 - 1.10 (mg/dL)    Calcium 9.7  8.4 - 10.5 (mg/dL)    GFR calc non Af Amer >90  >90 (mL/min)    GFR calc Af Amer >90  >90 (mL/min)     Radiology/Results: Ct Abdomen Pelvis W Contrast  02/12/2012  *RADIOLOGY REPORT*  Clinical Data: Upper abdominal pain.  Leukocytosis. 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  CT ABDOMEN AND PELVIS WITH CONTRAST  Technique:  Multidetector CT imaging of the abdomen and pelvis was performed following the standard protocol during bolus administration of intravenous contrast.  Contrast: 80mL OMNIPAQUE IOHEXOL 300 MG/ML  SOLN  Comparison: Multiple exams, including 02/02/2012 and 01/28/2012  Findings: Trace bilateral pleural effusions are present along with mild atelectasis in both lower lobes.  There is a small amount of free peritoneal gas.  Mild perihepatic ascites noted.  Along the inferior margin of the liver, along the margin of the spleen, and in the left abdomen the intra-abdominal fluid collections appeared have enhancing margins suggesting early loculation.  There are also small locules of gas and fluid along the anterior margin the retroperitoneum, for example on image 54 of series 2.  In the left abdomen, a fluid collection with enhancing margins is present  along the margin of the descending colon, with speckled gas in the vicinity of the junction of the descending colon and sigmoid colon suspicious for either mean of pneumatosis or pericolic loculations of extraluminal gas.  These irregular loculations extend down to the end-to-end anastomosis with the rectal stump.  The appearance favors numerous loculated abscesses in the pelvis.  Nondistended ileostomy noted, although there are dilated and somewhat irregular loops of small bowel in the right abdomen.  Mild retroperitoneal adenopathy noted.  Presacral and perirectal edema noted.  A small pericardial effusion is present.  Incidental note is made of some asymmetry of glandular tissue in the left outer breast; I do not observe this asymmetry on the prior PET CT of 08/06/2010, and although this may be incidental I recommend careful correlation with breast examination and a low threshold for follow-up breast ultrasound.  The laparotomy site noted, slightly open along the scan inferiorly.  No specific parenchymal abnormality of the liver, spleen, pancreas, adrenal glands, or kidneys noted.  IMPRESSION:  1.  Suspected breakdown at the rectosigmoid anastomoses, with free intraperitoneal gas, scattered pelvic and upper abdominal abscesses, and irregular gas tracking along the margins of the sigmoid colon (probably extraluminal but possibly intramural). No portal venous gas observed. 2.  Asymmetric soft tissue density in the outer half of the left breast.  Although possibly incidental, correlation with breast physical exam is recommended in determining the need for breast ultrasound to rule out breast cancer. 3.  Dilated proximal small bowel in the right abdomen, likely from ileus.  4.  Mild retroperitoneal adenopathy is highly likely to be reactive. 5.  Trace bilateral pleural effusions with small pericardial effusion.  Attempts to reach Barnetta Chapel, [place] with [facility] starting at 3:22 p.m. on 02/12/2012 were  unsuccessful. I subsequently learned from her office that she is currently not on duty. I discussed the findings regarding the suspected breakdown of the rectosigmoid anastomoses and abscesses with the on-call surgeon, Dr. Wenda Low, at 4:10 p.m. on 02/12/2012.  Original Report Authenticated By: Dellia Cloud,  M.D.    Anti-infectives: Anti-infectives     Start     Dose/Rate Route Frequency Ordered Stop   02/12/12 0800  piperacillin-tazobactam (ZOSYN) IVPB 3.375 g       3.375 g 12.5 mL/hr over 240 Minutes Intravenous 3 times per day 02/12/12 0800     02/10/12 1000   amoxicillin-clavulanate (AUGMENTIN) 875-125 MG per tablet 1 tablet  Status:  Discontinued        1 tablet Oral Every 12 hours 02/10/12 0823 02/12/12 0800   02/04/12 1800   cefOXitin (MEFOXIN) 1 g in dextrose 5 % 50 mL IVPB        1 g 100 mL/hr over 30 Minutes Intravenous Every 6 hours 02/04/12 1621 02/05/12 0617   02/04/12 0000   cefOXitin (MEFOXIN) 2 g in dextrose 5 % 50 mL IVPB        2 g 100 mL/hr over 30 Minutes Intravenous 60 min pre-op 02/03/12 0333 02/04/12 1145   02/03/12 0500   cefOXitin (MEFOXIN) 2 g in dextrose 5 % 50 mL IVPB  Status:  Discontinued        2 g 100 mL/hr over 30 Minutes Intravenous 60 min pre-op 02/02/12 1156 02/03/12 0333   02/03/12 0000   cefOXitin (MEFOXIN) 2 g in dextrose 5 % 50 mL IVPB  Status:  Discontinued        2 g 100 mL/hr over 30 Minutes Intravenous 60 min pre-op 02/02/12 1004 02/02/12 1156   01/27/12 2200   cefTRIAXone (ROCEPHIN) 1 g in dextrose 5 % 50 mL IVPB        1 g 100 mL/hr over 30 Minutes Intravenous Every 24 hours 01/27/12 2152 01/29/12 2359   01/27/12 1915   cefTRIAXone (ROCEPHIN) 1 g in dextrose 5 % 50 mL IVPB        1 g 100 mL/hr over 30 Minutes Intravenous  Once 01/27/12 1914 01/27/12 1957          Assessment/Plan: Problem List: Patient Active Problem List  Diagnoses  . Cervical cancer  . Post-radiation rectal bleeding  . Pyelonephritis  . Nausea  & vomiting  . Hypercalcemia  . Radiation colitis  . Sigmoid stricture    IR percut drainage requested for fluid collections within abdomen.   9 Days Post-Op    LOS: 17 days   Matt B. Daphine Deutscher, MD, [facility] [phone] beeper [phone]  02/13/2012 8:25 AM

## 2012-02-13 NOTE — Procedures (Signed)
Successful placement of 12 French drainage catheters into the abscess/fluid collection within the pelvis, right and left upper abdominal quadrants Approximately 40 mL of purulent fluid was aspirated from the pelvic drain Approximately 100 ml of purulent fluid was aspirated form the right upper quadrant drain. Approximately 250 ml of purulent fluid was aspirated from the left upper quadrant drain Total drainage - 390 ml Samples sent to lab as requested. No immediate post procedural complications.

## 2012-02-13 NOTE — Progress Notes (Signed)
Nutrition Follow-up  Diet Order:  NPO, previously had been on full liquids after not tolerating fat modified diet, pt requested full liquids per MD note.  Pt with fluid collection in abdomin, IR consulted for drainage. Made NPO for procedure.   Pt continues to have difficulty with intake. Has Ensure Complete ordered BID, provides 700 kcal and 26 gm protein daily.  Pt has recently been refusing them. If pt continues to be unable to tolerate PO intake may need to consider at least partial nutrition support.   Meds: Scheduled Meds:   . amLODipine  10 mg Oral Daily  . chlorhexidine  15 mL Mouth Rinse BID  . enoxaparin  40 mg Subcutaneous Q24H  . feeding supplement  237 mL Oral BID BM  . hydrochlorothiazide  12.5 mg Oral Daily  . morphine  15 mg Oral QHS  . morphine  30 mg Oral Daily  . mulitivitamin with minerals  1 tablet Oral Daily  . piperacillin-tazobactam (ZOSYN)  IV  3.375 g Intravenous Q8H  . white petrolatum       Continuous Infusions:   . dextrose 5 % and 0.45 % NaCl with KCl 20 mEq/L 50 mL/hr at 02/13/12 0536   PRN Meds:.acetaminophen, alum & mag hydroxide-simeth, diphenhydrAMINE, HYDROmorphone (DILAUDID) injection, iohexol, ondansetron (ZOFRAN) IV, ondansetron, oxyCODONE-acetaminophen  Labs:  CMP     Component Value Date/Time   NA 125* 02/13/2012 0546   K 3.1* 02/13/2012 0546   CL 87* 02/13/2012 0546   CO2 25 02/13/2012 0546   GLUCOSE 97 02/13/2012 0546   BUN 6 02/13/2012 0546   CREATININE 0.31* 02/13/2012 0546   CALCIUM 9.7 02/13/2012 0546   CALCIUM 10.7* 02/01/2012 0705   PROT 5.7* 02/10/2012 0600   ALBUMIN 1.8* 02/10/2012 0600   AST 37 02/10/2012 0600   ALT 27 02/10/2012 0600   ALKPHOS 83 02/10/2012 0600   BILITOT 1.0 02/10/2012 0600   GFRNONAA >90 02/13/2012 0546   GFRAA >90 02/13/2012 0546     Intake/Output Summary (Last 24 hours) at 02/13/12 1250 Last data filed at 02/13/12 0600  Gross per 24 hour  Intake 1563.5 ml  Output   2650 ml  Net -1086.5 ml    Weight  Status:  Pt has not had a recent weight. Recommend daily weights to assess for weight loss/adequacy of intake.   Re-estimated needs:  1800-2000 kcal, 75-85 gm protein.   Nutrition Dx:  Inadequate oral intake, ongoing  Goal:  PO intake of meals and supplements to provide >90% of estimated needs, currently unmet.   Intervention:   Continue with Ensure as ordered Recommend daily weights  Monitor:  PO intake, weights, labs, I/O's   Rudean Haskell Pager #:  [phone]

## 2012-02-14 MED ORDER — WHITE PETROLATUM GEL
Status: AC
  Filled 2012-02-14: qty 5

## 2012-02-14 NOTE — Progress Notes (Signed)
Pt transferred to 5100. Report given to [facility].

## 2012-02-14 NOTE — Progress Notes (Signed)
10 Days Post-Op  Subjective: Patient feeling a little better today; has intermittent nausea  Objective: Vital signs in last 24 hours: Temp:  [98.1 F (36.7 C)-98.8 F (37.1 C)] 98.8 F (37.1 C) (05/24 0506) Pulse Rate:  [101-117] 113  (05/24 0506) Resp:  [15-28] 20  (05/24 0506) BP: (111-149)/(69-104) 118/82 mmHg (05/24 0506) SpO2:  [94 %-100 %] 95 % (05/24 0506) Weight:  [156 lb 12 oz (71.1 kg)] 156 lb 12 oz (71.1 kg) (05/24 0506) Last BM Date: 02/13/12  Intake/Output from previous day: 05/23 0701 - 05/24 0700 In: 450 [I.V.:400; IV Piggyback:50] Out: 2300 [Urine:2150; Stool:150] Intake/Output this shift: Total I/O In: 120 [P.O.:120] Out: 500 [Urine:500]  Abd/pelvic drains intact, insertion sites ok, mildly tender, outputs about 15-20 cc's each feculent appearing fluid  Lab Results:   [facility] 02/13/12 0546 02/12/12 0552  WBC 16.8* 19.2*  HGB 8.9* 9.3*  HCT 25.1* 25.9*  PLT 186 153   BMET  Basename 02/13/12 0546 02/12/12 0552  NA 125* 126*  K 3.1* 3.1*  CL 87* 89*  CO2 25 28  GLUCOSE 97 86  BUN 6 8  CREATININE 0.31* 0.31*  CALCIUM 9.7 9.9   PT/INR  Basename 02/13/12 1120  LABPROT 16.2*  INR 1.27   ABG No results found for this basename: PHART:2,PCO2:2,PO2:2,HCO3:2 in the last 72 hours Results for orders placed during the hospital encounter of 01/27/12  URINE CULTURE     Status: Normal   Collection Time   01/27/12 11:54 PM      Component Value Range Status Comment   Specimen Description URINE, RANDOM   Final    Special Requests NONE   Final    Culture  Setup Time [id number]   Final    Colony Count NO GROWTH   Final    Culture NO GROWTH   Final    Report Status 01/29/2012 FINAL   Final   SURGICAL PCR SCREEN     Status: Normal   Collection Time   02/03/12  2:28 AM      Component Value Range Status Comment   MRSA, PCR NEGATIVE  NEGATIVE  Final    Staphylococcus aureus NEGATIVE  NEGATIVE  Final   SURGICAL PCR SCREEN     Status: Normal   Collection  Time   02/04/12  6:26 AM      Component Value Range Status Comment   MRSA, PCR NEGATIVE  NEGATIVE  Final    Staphylococcus aureus NEGATIVE  NEGATIVE  Final   CULTURE, ROUTINE-ABSCESS     Status: Normal (Preliminary result)   Collection Time   02/13/12  6:33 PM      Component Value Range Status Comment   Specimen Description ABSCESS DRAINAGE ABDOMEN LEFT LOWER   Final    Special Requests DRAIN NO1   Final    Gram Stain     Final    Value: ABUNDANT WBC PRESENT,BOTH PMN AND MONONUCLEAR     NO SQUAMOUS EPITHELIAL CELLS SEEN     ABUNDANT GRAM POSITIVE COCCI IN PAIRS     ABUNDANT GRAM POSITIVE RODS     ABUNDANT GRAM NEGATIVE RODS   Culture NO GROWTH   Final    Report Status PENDING   Incomplete   CULTURE, ROUTINE-ABSCESS     Status: Normal (Preliminary result)   Collection Time   02/13/12  7:00 PM      Component Value Range Status Comment   Specimen Description ABSCESS ABDOMEN DRAINAGE LEFT UPPER   Final    Special  Requests DRAIN NO 2   Final    Gram Stain     Final    Value: ABUNDANT WBC PRESENT,BOTH PMN AND MONONUCLEAR     NO SQUAMOUS EPITHELIAL CELLS SEEN     MODERATE GRAM POSITIVE COCCI IN PAIRS AND CHAINS     ABUNDANT GRAM NEGATIVE RODS   Culture NO GROWTH 1 DAY   Final    Report Status PENDING   Incomplete   CULTURE, ROUTINE-ABSCESS     Status: Normal (Preliminary result)   Collection Time   02/13/12  7:00 PM      Component Value Range Status Comment   Specimen Description ABSCESS ABDOMEN DRAINAGE RIGHT UPPER   Final    Special Requests DRAIN NO 3   Final    Gram Stain     Final    Value: ABUNDANT WBC PRESENT,BOTH PMN AND MONONUCLEAR     NO SQUAMOUS EPITHELIAL CELLS SEEN     ABUNDANT GRAM POSITIVE COCCI IN PAIRS     ABUNDANT GRAM NEGATIVE RODS   Culture NO GROWTH   Final    Report Status PENDING   Incomplete     Studies/Results: Ct Guided Abscess Drain  02/13/2012  *RADIOLOGY REPORT*  Indication: Multiple abdominal fluid collections  CT GUIDED ABDOMINAL DRAINAGE CATHETER  PLACEMENT x 3  Comparison: CT abdomen pelvis - 02/12/2012  Medications: Fentanyl 325 mcg IV; Versed 8.5 mg IV  Total Moderate Sedation time: 95 minutes  Contrast: None  Complications: None immediate  Technique / Findings:  Informed written consent was obtained from the patient after a discussion of the risks, benefits and alternatives to treatment. The patient was placed supine on the CT gantry and a pre procedural CT was performed re-demonstrating the known abscess/fluid collection within the pelvis and bilateral upper abdominal quadrants.  The procedure was planned.   A timeout was performed prior to the initiation of the procedure.  Initially the left lower and mid hemi abdomen were prepped and draped usual sterile fashion.  Attention was first paid towards the pelvic fluid collection.  The overlying soft tissues were anesthetized with 1% lidocaine with epinephrine.  An 18 gauge trocar needle was advanced in to the abscess/fluid collection and a short Amplatz super stiff wire was coiled within the abscess/fluid collection.   Appropriate positioning was confirmed with a limited CT scan.  The tract was serially dilated allowing placement of a 12 [place] all-purpose drainage catheter.  Appropriate positioning was confirmed with a limited postprocedural CT scan.  40 ml of purulent fluid was aspirated.  The tube was connected to a drainage bag and sutured in place.  A dressing was placed.  Attention was now paid towards the large collection within the left mid hemi abdomen.  The overlying soft tissues were anesthetized with 1% lidocaine with epinephrine.  An 18 gauge trocar needle was advanced into the abscess and a short Amplatz superstiff wire was coiled within the abscess.  Appropriate positioning was confirmed the limited CT scan.  The tract was serially dilated, allowing placement of a 12 [place] multipurpose drainage catheter. Approximately 250 ml of pleural fluid was aspirated.  The tube was connected to a JP  bulb and sutured in place.  A dressing was placed.  Attention was now paid towards the large collection within the right mid hemi abdomen.  The skin overlying the right mid hemi abdomen prepped and draped usual sterile fashion.  The overlying soft tissues were anesthetized with 1% lidocaine with epinephrine. An 18 gauge trocar needle  was advanced into the abscess and a short Amplatz superstiff wire was coiled within the abscess.  Appropriate positioning was confirmed the limited CT scan.  The tract was serially dilated, allowing placement of a 12 [place] multipurpose drainage catheter.  Approximately 100 ml of pleural fluid was aspirated.  The tube was connected to a JP bulb and sutured in place.  A dressing was placed.  The patient tolerated the procedure well without immediate post procedural complication.  Impression:  1.  Successful CT guided placement of a 12 French all purpose drain catheter into the abscess within the anterior aspect of the left lower pelvis with aspiration of 40 mL of purulent fluid.  2.  Successfu CT guided placement of a 12-French all-purpose drainage catheter into the abscess within the left mid hemiabdomen with aspiration of 250 ml of purulent fluid. 3.  Successful CT guided placement of a 12-French all-purpose drainage catheter into the abscess within the right mid hemiabdomen with aspiration of approximately 100 ml of purulent fluid.  4. Samples were sent to the laboratory as requested by the ordering clinical team.  Original Report Authenticated By: Waynard Reeds, M.D.   Ct Abdomen Pelvis W Contrast  02/12/2012  *RADIOLOGY REPORT*  Clinical Data: Upper abdominal pain.  Leukocytosis. 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  CT ABDOMEN AND PELVIS WITH CONTRAST  Technique:  Multidetector CT imaging of the abdomen and pelvis was performed following the standard protocol during  bolus administration of intravenous contrast.  Contrast: 80mL OMNIPAQUE IOHEXOL 300 MG/ML  SOLN  Comparison: Multiple exams, including 02/02/2012 and 01/28/2012  Findings: Trace bilateral pleural effusions are present along with mild atelectasis in both lower lobes.  There is a small amount of free peritoneal gas.  Mild perihepatic ascites noted.  Along the inferior margin of the liver, along the margin of the spleen, and in the left abdomen the intra-abdominal fluid collections appeared have enhancing margins suggesting early loculation.  There are also small locules of gas and fluid along the anterior margin the retroperitoneum, for example on image 54 of series 2.  In the left abdomen, a fluid collection with enhancing margins is present along the margin of the descending colon, with speckled gas in the vicinity of the junction of the descending colon and sigmoid colon suspicious for either mean of pneumatosis or pericolic loculations of extraluminal gas.  These irregular loculations extend down to the end-to-end anastomosis with the rectal stump.  The appearance favors numerous loculated abscesses in the pelvis.  Nondistended ileostomy noted, although there are dilated and somewhat irregular loops of small bowel in the right abdomen.  Mild retroperitoneal adenopathy noted.  Presacral and perirectal edema noted.  A small pericardial effusion is present.  Incidental note is made of some asymmetry of glandular tissue in the left outer breast; I do not observe this asymmetry on the prior PET CT of 08/06/2010, and although this may be incidental I recommend careful correlation with breast examination and a low threshold for follow-up breast ultrasound.  The laparotomy site noted, slightly open along the scan inferiorly.  No specific parenchymal abnormality of the liver, spleen, pancreas, adrenal glands, or kidneys noted.  IMPRESSION:  1.  Suspected breakdown at the rectosigmoid anastomoses, with free intraperitoneal  gas, scattered pelvic and upper abdominal abscesses, and irregular gas tracking along the margins of the sigmoid colon (probably extraluminal but possibly intramural). No portal venous gas observed.  2.  Asymmetric soft tissue density in the outer half of the left breast.  Although possibly incidental, correlation with breast physical exam is recommended in determining the need for breast ultrasound to rule out breast cancer. 3.  Dilated proximal small bowel in the right abdomen, likely from ileus.  4.  Mild retroperitoneal adenopathy is highly likely to be reactive. 5.  Trace bilateral pleural effusions with small pericardial effusion.  Attempts to reach Barnetta Chapel, [place] with [facility] starting at 3:22 p.m. on 02/12/2012 were unsuccessful. I subsequently learned from her office that she is currently not on duty. I discussed the findings regarding the suspected breakdown of the rectosigmoid anastomoses and abscesses with the on-call surgeon, Dr. Wenda Low, at 4:10 p.m. on 02/12/2012.  Original Report Authenticated By: Dellia Cloud, M.D.    Anti-infectives: Anti-infectives     Start     Dose/Rate Route Frequency Ordered Stop   02/12/12 0800  piperacillin-tazobactam (ZOSYN) IVPB 3.375 g       3.375 g 12.5 mL/hr over 240 Minutes Intravenous 3 times per day 02/12/12 0800     02/10/12 1000   amoxicillin-clavulanate (AUGMENTIN) 875-125 MG per tablet 1 tablet  Status:  Discontinued        1 tablet Oral Every 12 hours 02/10/12 0823 02/12/12 0800   02/04/12 1800   cefOXitin (MEFOXIN) 1 g in dextrose 5 % 50 mL IVPB        1 g 100 mL/hr over 30 Minutes Intravenous Every 6 hours 02/04/12 1621 02/05/12 0617   02/04/12 0000   cefOXitin (MEFOXIN) 2 g in dextrose 5 % 50 mL IVPB        2 g 100 mL/hr over 30 Minutes Intravenous 60 min pre-op 02/03/12 0333 02/04/12 1145   02/03/12 0500   cefOXitin (MEFOXIN) 2 g in dextrose 5 % 50 mL IVPB  Status:  Discontinued        2 g 100 mL/hr over  30 Minutes Intravenous 60 min pre-op 02/02/12 1156 02/03/12 0333   02/03/12 0000   cefOXitin (MEFOXIN) 2 g in dextrose 5 % 50 mL IVPB  Status:  Discontinued        2 g 100 mL/hr over 30 Minutes Intravenous 60 min pre-op 02/02/12 1004 02/02/12 1156   01/27/12 2200   cefTRIAXone (ROCEPHIN) 1 g in dextrose 5 % 50 mL IVPB        1 g 100 mL/hr over 30 Minutes Intravenous Every 24 hours 01/27/12 2152 01/29/12 2359   01/27/12 1915   cefTRIAXone (ROCEPHIN) 1 g in dextrose 5 % 50 mL IVPB        1 g 100 mL/hr over 30 Minutes Intravenous  Once 01/27/12 1914 01/27/12 1957          Assessment/Plan: s/p multiple abdominal abscess drainages 5/23; check final cx's; cont drain flushes; check f/u CT once outputs decrease; will need drain injections prior to removal    LOS: 18 days    Tiarah Shisler,D [facility] 02/14/2012

## 2012-02-14 NOTE — Progress Notes (Signed)
Patient ID: Vanessa [name], female   DOB: July 16, 1976, 36 y.o.   MRN: [id number] 10 Days Post-Op  Subjective: Pt having less pain since drains placed.  Less pain with urination.  Hungry and wants to eat.  Objective: Vital signs in last 24 hours: Temp:  [98.1 F (36.7 C)-99.7 F (37.6 C)] 98.8 F (37.1 C) (05/24 0506) Pulse Rate:  [99-117] 113  (05/24 0506) Resp:  [15-28] 20  (05/24 0506) BP: (111-149)/(69-104) 118/82 mmHg (05/24 0506) SpO2:  [94 %-100 %] 95 % (05/24 0506) Weight:  [156 lb 12 oz (71.1 kg)] 156 lb 12 oz (71.1 kg) (05/24 0506) Last BM Date: 02/13/12  Intake/Output from previous day: 05/23 0701 - 05/24 0700 In: 450 [I.V.:400; IV Piggyback:50] Out: 2300 [Urine:2150; Stool:150] Intake/Output this shift: Total I/O In: -  Out: 500 [Urine:500]  PE: Abd: soft, tender, but less, wound is clean, ostomy with output.  Each drain with tan cloudy output, most c/w feculent output, except for left sided JP that is yellow with thick chunks of material present.  Lab Results:   Basename 02/13/12 0546 02/12/12 0552  WBC 16.8* 19.2*  HGB 8.9* 9.3*  HCT 25.1* 25.9*  PLT 186 153   BMET  Basename 02/13/12 0546 02/12/12 0552  NA 125* 126*  K 3.1* 3.1*  CL 87* 89*  CO2 25 28  GLUCOSE 97 86  BUN 6 8  CREATININE 0.31* 0.31*  CALCIUM 9.7 9.9   PT/INR  Basename 02/13/12 1120  LABPROT 16.2*  INR 1.27   CMP     Component Value Date/Time   NA 125* 02/13/2012 0546   K 3.1* 02/13/2012 0546   CL 87* 02/13/2012 0546   CO2 25 02/13/2012 0546   GLUCOSE 97 02/13/2012 0546   BUN 6 02/13/2012 0546   CREATININE 0.31* 02/13/2012 0546   CALCIUM 9.7 02/13/2012 0546   CALCIUM 10.7* 02/01/2012 0705   PROT 5.7* 02/10/2012 0600   ALBUMIN 1.8* 02/10/2012 0600   AST 37 02/10/2012 0600   ALT 27 02/10/2012 0600   ALKPHOS 83 02/10/2012 0600   BILITOT 1.0 02/10/2012 0600   GFRNONAA >90 02/13/2012 0546   GFRAA >90 02/13/2012 0546   Lipase     Component Value Date/Time   LIPASE 16 11/13/2011 1007        Studies/Results: Ct Guided Abscess Drain  02/13/2012  *RADIOLOGY REPORT*  Indication: Multiple abdominal fluid collections  CT GUIDED ABDOMINAL DRAINAGE CATHETER PLACEMENT x 3  Comparison: CT abdomen pelvis - 02/12/2012  Medications: Fentanyl 325 mcg IV; Versed 8.5 mg IV  Total Moderate Sedation time: 95 minutes  Contrast: None  Complications: None immediate  Technique / Findings:  Informed written consent was obtained from the patient after a discussion of the risks, benefits and alternatives to treatment. The patient was placed supine on the CT gantry and a pre procedural CT was performed re-demonstrating the known abscess/fluid collection within the pelvis and bilateral upper abdominal quadrants.  The procedure was planned.   A timeout was performed prior to the initiation of the procedure.  Initially the left lower and mid hemi abdomen were prepped and draped usual sterile fashion.  Attention was first paid towards the pelvic fluid collection.  The overlying soft tissues were anesthetized with 1% lidocaine with epinephrine.  An 18 gauge trocar needle was advanced in to the abscess/fluid collection and a short Amplatz super stiff wire was coiled within the abscess/fluid collection.   Appropriate positioning was confirmed with a limited CT scan.  The tract  was serially dilated allowing placement of a 12 [place] all-purpose drainage catheter.  Appropriate positioning was confirmed with a limited postprocedural CT scan.  40 ml of purulent fluid was aspirated.  The tube was connected to a drainage bag and sutured in place.  A dressing was placed.  Attention was now paid towards the large collection within the left mid hemi abdomen.  The overlying soft tissues were anesthetized with 1% lidocaine with epinephrine.  An 18 gauge trocar needle was advanced into the abscess and a short Amplatz superstiff wire was coiled within the abscess.  Appropriate positioning was confirmed the limited CT scan.  The tract  was serially dilated, allowing placement of a 12 [place] multipurpose drainage catheter. Approximately 250 ml of pleural fluid was aspirated.  The tube was connected to a JP bulb and sutured in place.  A dressing was placed.  Attention was now paid towards the large collection within the right mid hemi abdomen.  The skin overlying the right mid hemi abdomen prepped and draped usual sterile fashion.  The overlying soft tissues were anesthetized with 1% lidocaine with epinephrine. An 18 gauge trocar needle was advanced into the abscess and a short Amplatz superstiff wire was coiled within the abscess.  Appropriate positioning was confirmed the limited CT scan.  The tract was serially dilated, allowing placement of a 12 [place] multipurpose drainage catheter.  Approximately 100 ml of pleural fluid was aspirated.  The tube was connected to a JP bulb and sutured in place.  A dressing was placed.  The patient tolerated the procedure well without immediate post procedural complication.  Impression:  1.  Successful CT guided placement of a 12 French all purpose drain catheter into the abscess within the anterior aspect of the left lower pelvis with aspiration of 40 mL of purulent fluid.  2.  Successfu CT guided placement of a 12-French all-purpose drainage catheter into the abscess within the left mid hemiabdomen with aspiration of 250 ml of purulent fluid. 3.  Successful CT guided placement of a 12-French all-purpose drainage catheter into the abscess within the right mid hemiabdomen with aspiration of approximately 100 ml of purulent fluid.  4. Samples were sent to the laboratory as requested by the ordering clinical team.  Original Report Authenticated By: Waynard Reeds, M.D.   Ct Abdomen Pelvis W Contrast  02/12/2012  *RADIOLOGY REPORT*  Clinical Data: Upper abdominal pain.  Leukocytosis. 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  CT ABDOMEN AND PELVIS WITH CONTRAST  Technique:  Multidetector CT imaging of the abdomen and pelvis was performed following the standard protocol during bolus administration of intravenous contrast.  Contrast: 80mL OMNIPAQUE IOHEXOL 300 MG/ML  SOLN  Comparison: Multiple exams, including 02/02/2012 and 01/28/2012  Findings: Trace bilateral pleural effusions are present along with mild atelectasis in both lower lobes.  There is a small amount of free peritoneal gas.  Mild perihepatic ascites noted.  Along the inferior margin of the liver, along the margin of the spleen, and in the left abdomen the intra-abdominal fluid collections appeared have enhancing margins suggesting early loculation.  There are also small locules of gas and fluid along the anterior margin the retroperitoneum, for example on image 54 of series 2.  In the left abdomen, a fluid collection with enhancing margins is present along the margin of the descending colon, with speckled gas in the vicinity of the junction of the descending  colon and sigmoid colon suspicious for either mean of pneumatosis or pericolic loculations of extraluminal gas.  These irregular loculations extend down to the end-to-end anastomosis with the rectal stump.  The appearance favors numerous loculated abscesses in the pelvis.  Nondistended ileostomy noted, although there are dilated and somewhat irregular loops of small bowel in the right abdomen.  Mild retroperitoneal adenopathy noted.  Presacral and perirectal edema noted.  A small pericardial effusion is present.  Incidental note is made of some asymmetry of glandular tissue in the left outer breast; I do not observe this asymmetry on the prior PET CT of 08/06/2010, and although this may be incidental I recommend careful correlation with breast examination and a low threshold for follow-up breast ultrasound.  The laparotomy site noted, slightly open along the scan inferiorly.  No specific parenchymal  abnormality of the liver, spleen, pancreas, adrenal glands, or kidneys noted.  IMPRESSION:  1.  Suspected breakdown at the rectosigmoid anastomoses, with free intraperitoneal gas, scattered pelvic and upper abdominal abscesses, and irregular gas tracking along the margins of the sigmoid colon (probably extraluminal but possibly intramural). No portal venous gas observed. 2.  Asymmetric soft tissue density in the outer half of the left breast.  Although possibly incidental, correlation with breast physical exam is recommended in determining the need for breast ultrasound to rule out breast cancer. 3.  Dilated proximal small bowel in the right abdomen, likely from ileus.  4.  Mild retroperitoneal adenopathy is highly likely to be reactive. 5.  Trace bilateral pleural effusions with small pericardial effusion.  Attempts to reach Barnetta Chapel, [place] with [facility] starting at 3:22 p.m. on 02/12/2012 were unsuccessful. I subsequently learned from her office that she is currently not on duty. I discussed the findings regarding the suspected breakdown of the rectosigmoid anastomoses and abscesses with the on-call surgeon, Dr. Wenda Low, at 4:10 p.m. on 02/12/2012.  Original Report Authenticated By: Dellia Cloud, M.D.    Anti-infectives: Anti-infectives     Start     Dose/Rate Route Frequency Ordered Stop   02/12/12 0800  piperacillin-tazobactam (ZOSYN) IVPB 3.375 g       3.375 g 12.5 mL/hr over 240 Minutes Intravenous 3 times per day 02/12/12 0800     02/10/12 1000   amoxicillin-clavulanate (AUGMENTIN) 875-125 MG per tablet 1 tablet  Status:  Discontinued        1 tablet Oral Every 12 hours 02/10/12 0823 02/12/12 0800   02/04/12 1800   cefOXitin (MEFOXIN) 1 g in dextrose 5 % 50 mL IVPB        1 g 100 mL/hr over 30 Minutes Intravenous Every 6 hours 02/04/12 1621 02/05/12 0617   02/04/12 0000   cefOXitin (MEFOXIN) 2 g in dextrose 5 % 50 mL IVPB        2 g 100 mL/hr over 30 Minutes  Intravenous 60 min pre-op 02/03/12 0333 02/04/12 1145   02/03/12 0500   cefOXitin (MEFOXIN) 2 g in dextrose 5 % 50 mL IVPB  Status:  Discontinued        2 g 100 mL/hr over 30 Minutes Intravenous 60 min pre-op 02/02/12 1156 02/03/12 0333   02/03/12 0000   cefOXitin (MEFOXIN) 2 g in dextrose 5 % 50 mL IVPB  Status:  Discontinued        2 g 100 mL/hr over 30 Minutes Intravenous 60 min pre-op 02/02/12 1004 02/02/12 1156   01/27/12 2200   cefTRIAXone (ROCEPHIN) 1 g in dextrose 5 %  50 mL IVPB        1 g 100 mL/hr over 30 Minutes Intravenous Every 24 hours 01/27/12 2152 01/29/12 2359   01/27/12 1915   cefTRIAXone (ROCEPHIN) 1 g in dextrose 5 % 50 mL IVPB        1 g 100 mL/hr over 30 Minutes Intravenous  Once 01/27/12 1914 01/27/12 1957           Assessment/Plan  1. S/p ex lap with sig colectomy and diverting ileostomy, now with anastomotic breakdown and multiple pelvic fluid collections, s/p perc drain x 3  Plan: 1. Cont IV zosyn 2. Recheck labs in the am 3. Advance to regular diet 4. Cont dressing changes 5. Cont to encourage mobilization!!!!  Patient is very unmotivated to get OOB and do any type of mobilization.   LOS: 18 days    Shaquisha Wynn E 02/14/2012

## 2012-02-15 LAB — BASIC METABOLIC PANEL
BUN: 5 mg/dL — ABNORMAL LOW (ref 6–23)
CO2: 28 mEq/L (ref 19–32)
Chloride: 87 mEq/L — ABNORMAL LOW (ref 96–112)
Creatinine, Ser: 0.34 mg/dL — ABNORMAL LOW (ref 0.50–1.10)
Glucose, Bld: 101 mg/dL — ABNORMAL HIGH (ref 70–99)

## 2012-02-15 LAB — CBC
HCT: 23.9 % — ABNORMAL LOW (ref 36.0–46.0)
MCV: 83.6 fL (ref 78.0–100.0)
RBC: 2.86 MIL/uL — ABNORMAL LOW (ref 3.87–5.11)
RDW: 14.3 % (ref 11.5–15.5)
WBC: 14.8 10*3/uL — ABNORMAL HIGH (ref 4.0–10.5)

## 2012-02-15 NOTE — Progress Notes (Signed)
Noted new bloody drainage on the RUQ drain gauze (had saturated about 1/2 of the 4x4 gauze pad).  Pt states that this is new drainage.  Called [place], [place] and notified him of the drainage.  We will watch the drainage and note if there is any increase.

## 2012-02-15 NOTE — Progress Notes (Signed)
Patient ID: Vanessa [name], female   DOB: 08-24-1976, 36 y.o.   MRN: [id number] 11 Days Post-Op  Subjective: Tolerating some solid food.  Incisional soreness  Objective: Vital signs in last 24 hours: Temp:  [97.9 F (36.6 C)-98.7 F (37.1 C)] 98.7 F (37.1 C) (05/25 0548) Pulse Rate:  [96-109] 107  (05/25 0548) Resp:  [17-18] 18  (05/25 0548) BP: (109-130)/(72-79) 109/72 mmHg (05/25 0548) SpO2:  [94 %-97 %] 94 % (05/25 0548) Last BM Date: 02/14/12  Intake/Output from previous day: 05/24 0701 - 05/25 0700 In: 1143 [P.O.:120; I.V.:845; IV Piggyback:158] Out: 2450 [Urine:1500; Drains:175; Stool:775] Intake/Output this shift:    PE: Abd: soft, open area of midline wound is clean, loop ileostomy is pink with gas and liquid in back, all 3 drains have purulent output in them  Lab Results:   Basename 02/15/12 0630 02/13/12 0546  WBC 14.8* 16.8*  HGB 8.5* 8.9*  HCT 23.9* 25.1*  PLT 240 186   BMET  Basename 02/15/12 0630 02/13/12 0546  NA 125* 125*  K 3.3* 3.1*  CL 87* 87*  CO2 28 25  GLUCOSE 101* 97  BUN 5* 6  CREATININE 0.34* 0.31*  CALCIUM 9.7 9.7   PT/INR  Basename 02/13/12 1120  LABPROT 16.2*  INR 1.27   CMP     Component Value Date/Time   NA 125* 02/15/2012 0630   K 3.3* 02/15/2012 0630   CL 87* 02/15/2012 0630   CO2 28 02/15/2012 0630   GLUCOSE 101* 02/15/2012 0630   BUN 5* 02/15/2012 0630   CREATININE 0.34* 02/15/2012 0630   CALCIUM 9.7 02/15/2012 0630   CALCIUM 10.7* 02/01/2012 0705   PROT 5.7* 02/10/2012 0600   ALBUMIN 1.8* 02/10/2012 0600   AST 37 02/10/2012 0600   ALT 27 02/10/2012 0600   ALKPHOS 83 02/10/2012 0600   BILITOT 1.0 02/10/2012 0600   GFRNONAA >90 02/15/2012 0630   GFRAA >90 02/15/2012 0630   Lipase     Component Value Date/Time   LIPASE 16 11/13/2011 1007       Studies/Results: Ct Guided Abscess Drain  02/13/2012  *RADIOLOGY REPORT*  Indication: Multiple abdominal fluid collections  CT GUIDED ABDOMINAL DRAINAGE CATHETER PLACEMENT x 3   Comparison: CT abdomen pelvis - 02/12/2012  Medications: Fentanyl 325 mcg IV; Versed 8.5 mg IV  Total Moderate Sedation time: 95 minutes  Contrast: None  Complications: None immediate  Technique / Findings:  Informed written consent was obtained from the patient after a discussion of the risks, benefits and alternatives to treatment. The patient was placed supine on the CT gantry and a pre procedural CT was performed re-demonstrating the known abscess/fluid collection within the pelvis and bilateral upper abdominal quadrants.  The procedure was planned.   A timeout was performed prior to the initiation of the procedure.  Initially the left lower and mid hemi abdomen were prepped and draped usual sterile fashion.  Attention was first paid towards the pelvic fluid collection.  The overlying soft tissues were anesthetized with 1% lidocaine with epinephrine.  An 18 gauge trocar needle was advanced in to the abscess/fluid collection and a short Amplatz super stiff wire was coiled within the abscess/fluid collection.   Appropriate positioning was confirmed with a limited CT scan.  The tract was serially dilated allowing placement of a 12 [place] all-purpose drainage catheter.  Appropriate positioning was confirmed with a limited postprocedural CT scan.  40 ml of purulent fluid was aspirated.  The tube was connected to a drainage bag  and sutured in place.  A dressing was placed.  Attention was now paid towards the large collection within the left mid hemi abdomen.  The overlying soft tissues were anesthetized with 1% lidocaine with epinephrine.  An 18 gauge trocar needle was advanced into the abscess and a short Amplatz superstiff wire was coiled within the abscess.  Appropriate positioning was confirmed the limited CT scan.  The tract was serially dilated, allowing placement of a 12 [place] multipurpose drainage catheter. Approximately 250 ml of pleural fluid was aspirated.  The tube was connected to a JP bulb and sutured  in place.  A dressing was placed.  Attention was now paid towards the large collection within the right mid hemi abdomen.  The skin overlying the right mid hemi abdomen prepped and draped usual sterile fashion.  The overlying soft tissues were anesthetized with 1% lidocaine with epinephrine. An 18 gauge trocar needle was advanced into the abscess and a short Amplatz superstiff wire was coiled within the abscess.  Appropriate positioning was confirmed the limited CT scan.  The tract was serially dilated, allowing placement of a 12 [place] multipurpose drainage catheter.  Approximately 100 ml of pleural fluid was aspirated.  The tube was connected to a JP bulb and sutured in place.  A dressing was placed.  The patient tolerated the procedure well without immediate post procedural complication.  Impression:  1.  Successful CT guided placement of a 12 French all purpose drain catheter into the abscess within the anterior aspect of the left lower pelvis with aspiration of 40 mL of purulent fluid.  2.  Successfu CT guided placement of a 12-French all-purpose drainage catheter into the abscess within the left mid hemiabdomen with aspiration of 250 ml of purulent fluid. 3.  Successful CT guided placement of a 12-French all-purpose drainage catheter into the abscess within the right mid hemiabdomen with aspiration of approximately 100 ml of purulent fluid.  4. Samples were sent to the laboratory as requested by the ordering clinical team.  Original Report Authenticated By: Waynard Reeds, M.D.    Anti-infectives: Anti-infectives     Start     Dose/Rate Route Frequency Ordered Stop   02/12/12 0800  piperacillin-tazobactam (ZOSYN) IVPB 3.375 g       3.375 g 12.5 mL/hr over 240 Minutes Intravenous 3 times per day 02/12/12 0800     02/10/12 1000   amoxicillin-clavulanate (AUGMENTIN) 875-125 MG per tablet 1 tablet  Status:  Discontinued        1 tablet Oral Every 12 hours 02/10/12 0823 02/12/12 0800   02/04/12 1800    cefOXitin (MEFOXIN) 1 g in dextrose 5 % 50 mL IVPB        1 g 100 mL/hr over 30 Minutes Intravenous Every 6 hours 02/04/12 1621 02/05/12 0617   02/04/12 0000   cefOXitin (MEFOXIN) 2 g in dextrose 5 % 50 mL IVPB        2 g 100 mL/hr over 30 Minutes Intravenous 60 min pre-op 02/03/12 0333 02/04/12 1145   02/03/12 0500   cefOXitin (MEFOXIN) 2 g in dextrose 5 % 50 mL IVPB  Status:  Discontinued        2 g 100 mL/hr over 30 Minutes Intravenous 60 min pre-op 02/02/12 1156 02/03/12 0333   02/03/12 0000   cefOXitin (MEFOXIN) 2 g in dextrose 5 % 50 mL IVPB  Status:  Discontinued        2 g 100 mL/hr over 30 Minutes Intravenous 60  min pre-op 02/02/12 1004 02/02/12 1156   01/27/12 2200   cefTRIAXone (ROCEPHIN) 1 g in dextrose 5 % 50 mL IVPB        1 g 100 mL/hr over 30 Minutes Intravenous Every 24 hours 01/27/12 2152 01/29/12 2359   01/27/12 1915   cefTRIAXone (ROCEPHIN) 1 g in dextrose 5 % 50 mL IVPB        1 g 100 mL/hr over 30 Minutes Intravenous  Once 01/27/12 1914 01/27/12 1957           Assessment/Plan  1. S/p ex lap with sig colectomy and diverting ileostomy, now with anastomotic breakdown and multiple pelvic fluid collections, s/p perc drain x 3-purulent output per drain.  On a regular diet now.  Plan:  Continue IV abxs and drainage.  Recheck CBC 5/27   LOS: 19 days    Latrel Szymczak J 02/15/2012

## 2012-02-15 NOTE — Progress Notes (Signed)
Subjective: Pt ok. Feeling some better. Not much appetite with odor from drains and ostomy.   Objective: Physical Exam: BP 109/72  Pulse 107  Temp(Src) 98.7 F (37.1 C) (Oral)  Resp 18  Ht 5\' 4"  (1.626 m)  Wt 156 lb 12 oz (71.1 kg)  BMI 26.91 kg/m2  SpO2 94% Drains intact:  RUQ-25cc output-tan/brown  LUQ-25cc-tan/brown  LLQ- 125cc-brown    Labs: CBC  Basename 02/15/12 0630 02/13/12 0546  WBC 14.8* 16.8*  HGB 8.5* 8.9*  HCT 23.9* 25.1*  PLT 240 186   BMET  Basename 02/15/12 0630 02/13/12 0546  NA 125* 125*  K 3.3* 3.1*  CL 87* 87*  CO2 28 25  GLUCOSE 101* 97  BUN 5* 6  CREATININE 0.34* 0.31*  CALCIUM 9.7 9.7   LFT No results found for this basename: PROT,ALBUMIN,AST,ALT,ALKPHOS,BILITOT,BILIDIR,IBILI,LIPASE in the last 72 hours PT/INR  Basename 02/13/12 1120  LABPROT 16.2*  INR 1.27     Studies/Results: Ct Guided Abscess Drain  02/13/2012  *RADIOLOGY REPORT*  Indication: Multiple abdominal fluid collections  CT GUIDED ABDOMINAL DRAINAGE CATHETER PLACEMENT x 3  Comparison: CT abdomen pelvis - 02/12/2012  Medications: Fentanyl 325 mcg IV; Versed 8.5 mg IV  Total Moderate Sedation time: 95 minutes  Contrast: None  Complications: None immediate  Technique / Findings:  Informed written consent was obtained from the patient after a discussion of the risks, benefits and alternatives to treatment. The patient was placed supine on the CT gantry and a pre procedural CT was performed re-demonstrating the known abscess/fluid collection within the pelvis and bilateral upper abdominal quadrants.  The procedure was planned.   A timeout was performed prior to the initiation of the procedure.  Initially the left lower and mid hemi abdomen were prepped and draped usual sterile fashion.  Attention was first paid towards the pelvic fluid collection.  The overlying soft tissues were anesthetized with 1% lidocaine with epinephrine.  An 18 gauge trocar needle was advanced in to the  abscess/fluid collection and a short Amplatz super stiff wire was coiled within the abscess/fluid collection.   Appropriate positioning was confirmed with a limited CT scan.  The tract was serially dilated allowing placement of a 12 [place] all-purpose drainage catheter.  Appropriate positioning was confirmed with a limited postprocedural CT scan.  40 ml of purulent fluid was aspirated.  The tube was connected to a drainage bag and sutured in place.  A dressing was placed.  Attention was now paid towards the large collection within the left mid hemi abdomen.  The overlying soft tissues were anesthetized with 1% lidocaine with epinephrine.  An 18 gauge trocar needle was advanced into the abscess and a short Amplatz superstiff wire was coiled within the abscess.  Appropriate positioning was confirmed the limited CT scan.  The tract was serially dilated, allowing placement of a 12 [place] multipurpose drainage catheter. Approximately 250 ml of pleural fluid was aspirated.  The tube was connected to a JP bulb and sutured in place.  A dressing was placed.  Attention was now paid towards the large collection within the right mid hemi abdomen.  The skin overlying the right mid hemi abdomen prepped and draped usual sterile fashion.  The overlying soft tissues were anesthetized with 1% lidocaine with epinephrine. An 18 gauge trocar needle was advanced into the abscess and a short Amplatz superstiff wire was coiled within the abscess.  Appropriate positioning was confirmed the limited CT scan.  The tract was serially dilated, allowing placement of a  12 [place] multipurpose drainage catheter.  Approximately 100 ml of pleural fluid was aspirated.  The tube was connected to a JP bulb and sutured in place.  A dressing was placed.  The patient tolerated the procedure well without immediate post procedural complication.  Impression:  1.  Successful CT guided placement of a 12 French all purpose drain catheter into the abscess within  the anterior aspect of the left lower pelvis with aspiration of 40 mL of purulent fluid.  2.  Successfu CT guided placement of a 12-French all-purpose drainage catheter into the abscess within the left mid hemiabdomen with aspiration of 250 ml of purulent fluid. 3.  Successful CT guided placement of a 12-French all-purpose drainage catheter into the abscess within the right mid hemiabdomen with aspiration of approximately 100 ml of purulent fluid.  4. Samples were sent to the laboratory as requested by the ordering clinical team.  Original Report Authenticated By: Waynard Reeds, M.D.    Assessment/Plan: WBC trend down, pt feeling some better. s/p multiple abdominal abscess drainages 5/23; check final cx's; cont drain flushes; check f/u CT once outputs decrease; will need drain injections prior to removal     LOS: 19 days    Brayton El PA-C 02/15/2012 8:59 AM

## 2012-02-15 NOTE — Progress Notes (Signed)
Pt still having some bloody drainage around drain insertion site.  Minimal amount noted on pad under patient.  Drainage previously noted by another RN and PA was notified.  Changed dressing and secured it better.  Will continue to monitor. Sol Blazing [facility] 02/15/2012

## 2012-02-16 MED ORDER — SIMETHICONE 80 MG PO CHEW
40.0000 mg | CHEWABLE_TABLET | Freq: Four times a day (QID) | ORAL | Status: DC | PRN
  Administered 2012-02-16: 40 mg via ORAL
  Filled 2012-02-16: qty 1

## 2012-02-16 MED ORDER — ZOLPIDEM TARTRATE 5 MG PO TABS
10.0000 mg | ORAL_TABLET | Freq: Every evening | ORAL | Status: DC | PRN
  Administered 2012-02-16 – 2012-03-05 (×5): 10 mg via ORAL
  Filled 2012-02-16 (×5): qty 2
  Filled 2012-02-16 (×2): qty 1

## 2012-02-16 NOTE — Progress Notes (Signed)
Patient ID: Vanessa [name], female   DOB: 1975-09-29, 36 y.o.   MRN: [id number] 12 Days Post-Op  Subjective: Eating better.  Feeling better.  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 with gas and liquid in bag, all 3 drains still have purulent output in them  Lab Results:   Basename 02/15/12 0630  WBC 14.8*  HGB 8.5*  HCT 23.9*  PLT 240   BMET  Basename 02/15/12 0630  NA 125*  K 3.3*  CL 87*  CO2 28  GLUCOSE 101*  BUN 5*  CREATININE 0.34*  CALCIUM 9.7   PT/INR  Basename 02/13/12 1120  LABPROT 16.2*  INR 1.27   CMP     Component Value Date/Time   NA 125* 02/15/2012 0630   K 3.3* 02/15/2012 0630   CL 87* 02/15/2012 0630   CO2 28 02/15/2012 0630   GLUCOSE 101* 02/15/2012 0630   BUN 5* 02/15/2012 0630   CREATININE 0.34* 02/15/2012 0630   CALCIUM 9.7 02/15/2012 0630   CALCIUM 10.7* 02/01/2012 0705   PROT 5.7* 02/10/2012 0600   ALBUMIN 1.8* 02/10/2012 0600   AST 37 02/10/2012 0600   ALT 27 02/10/2012 0600   ALKPHOS 83 02/10/2012 0600   BILITOT 1.0 02/10/2012 0600   GFRNONAA >90 02/15/2012 0630   GFRAA >90 02/15/2012 0630   Lipase     Component Value Date/Time   LIPASE 16 11/13/2011 1007       Studies/Results: No results found.  Anti-infectives: Anti-infectives     Start     Dose/Rate Route Frequency Ordered Stop   02/12/12 0800  piperacillin-tazobactam (ZOSYN) IVPB 3.375 g       3.375 g 12.5 mL/hr over 240 Minutes Intravenous 3 times per day 02/12/12 0800     02/10/12 1000   amoxicillin-clavulanate (AUGMENTIN) 875-125 MG per tablet 1 tablet  Status:  Discontinued        1 tablet Oral Every 12 hours 02/10/12 0823 02/12/12 0800   02/04/12 1800   cefOXitin (MEFOXIN) 1 g in dextrose 5 % 50 mL IVPB        1 g 100 mL/hr over 30 Minutes Intravenous Every 6 hours 02/04/12 1621 02/05/12 0617   02/04/12 0000   cefOXitin (MEFOXIN) 2 g in dextrose 5 % 50 mL IVPB        2 g 100 mL/hr over 30 Minutes Intravenous 60 min pre-op 02/03/12 0333 02/04/12 1145   02/03/12 0500   cefOXitin (MEFOXIN) 2 g in dextrose 5 % 50 mL IVPB  Status:  Discontinued        2 g 100 mL/hr over 30 Minutes Intravenous 60 min pre-op 02/02/12 1156 02/03/12 0333   02/03/12 0000   cefOXitin (MEFOXIN) 2 g in dextrose 5 % 50 mL IVPB  Status:  Discontinued        2 g 100 mL/hr over 30 Minutes Intravenous 60 min pre-op 02/02/12 1004 02/02/12 1156   01/27/12 2200   cefTRIAXone (ROCEPHIN) 1 g in dextrose 5 %  50 mL IVPB        1 g 100 mL/hr over 30 Minutes Intravenous Every 24 hours 01/27/12 2152 01/29/12 2359   01/27/12 1915   cefTRIAXone (ROCEPHIN) 1 g in dextrose 5 % 50 mL IVPB        1 g 100 mL/hr over 30 Minutes Intravenous  Once 01/27/12 1914 01/27/12 1957           Assessment/Plan  1. S/p ex lap with sig colectomy and diverting ileostomy, now with anastomotic breakdown and multiple pelvic fluid collections, s/p perc drain x 3-purulent output per drain.  On a regular diet now.  She is feeling a little better.  Plan:  Continue IV abxs and drainage.  Recheck CBC tomorrow.  Repeat CT when drainage decreases and is less purulent appearing.   LOS: 20 days    Shayona Hibbitts J 02/16/2012

## 2012-02-16 NOTE — Progress Notes (Signed)
Subjective: Pt ok. Feeling some better. Not much appetite with odor from drains and ostomy. RN noted some bleeding from RUQ drain site yesterday. Dressing changed and site monitored. Eventually bleeding ceased and has not had any since yesterday afternoon. Pt denies any new pain.   Objective: Physical Exam: BP 119/76  Pulse 95  Temp(Src) 98.1 F (36.7 C) (Oral)  Resp 19  Ht 5\' 4"  (1.626 m)  Wt 156 lb 12 oz (71.1 kg)  BMI 26.91 kg/m2  SpO2 99% Drains intact:  RUQ-45cc output-purulent, no bleeding at site.  LUQ-20cc-purulent  LLQ- 60cc-purulent    Labs: CBC  Basename 02/15/12 0630  WBC 14.8*  HGB 8.5*  HCT 23.9*  PLT 240   BMET  Basename 02/15/12 0630  NA 125*  K 3.3*  CL 87*  CO2 28  GLUCOSE 101*  BUN 5*  CREATININE 0.34*  CALCIUM 9.7   LFT No results found for this basename: PROT,ALBUMIN,AST,ALT,ALKPHOS,BILITOT,BILIDIR,IBILI,LIPASE in the last 72 hours PT/INR  Basename 02/13/12 1120  LABPROT 16.2*  INR 1.27     Studies/Results: No results found.  Assessment/Plan: WBC trend down, pt feeling some better. s/p multiple abdominal abscess drainages 5/23; check final cx's; cont drain flushes; check f/u CT once outputs decrease; will need drain injections prior to removal     LOS: 20 days    Brayton El PA-C 02/16/2012 7:50 AM

## 2012-02-17 LAB — CULTURE, ROUTINE-ABSCESS

## 2012-02-17 LAB — CBC
HCT: 25 % — ABNORMAL LOW (ref 36.0–46.0)
MCH: 29.6 pg (ref 26.0–34.0)
MCHC: 34.8 g/dL (ref 30.0–36.0)
MCV: 85 fL (ref 78.0–100.0)
RDW: 14.9 % (ref 11.5–15.5)

## 2012-02-17 NOTE — Progress Notes (Signed)
Subjective: Pt ok. Feeling some better.  No recurrent bleeding from drain site.   Objective: Physical Exam: BP 117/72  Pulse 93  Temp(Src) 97.8 F (36.6 C) (Oral)  Resp 18  Ht 5\' 4"  (1.626 m)  Wt 156 lb 12 oz (71.1 kg)  BMI 26.91 kg/m2  SpO2 98% Drains intact:  RUQ-25cc output-purulent, no bleeding at site.  LUQ-25cc-purulent  LLQ- 130cc-purulent    Labs: CBC  Basename 02/17/12 0625 02/15/12 0630  WBC 12.9* 14.8*  HGB 8.7* 8.5*  HCT 25.0* 23.9*  PLT 139* 240   BMET  Basename 02/15/12 0630  NA 125*  K 3.3*  CL 87*  CO2 28  GLUCOSE 101*  BUN 5*  CREATININE 0.34*  CALCIUM 9.7   LFT No results found for this basename: PROT,ALBUMIN,AST,ALT,ALKPHOS,BILITOT,BILIDIR,IBILI,LIPASE in the last 72 hours PT/INR No results found for this basename: LABPROT:2,INR:2 in the last 72 hours   Studies/Results: No results found.  Assessment/Plan: WBC trend down, pt feeling some better. s/p multiple abdominal abscess drainages 5/23; check final cx's; cont drain flushes; check f/u CT once outputs decrease; will need drain injections prior to removal     LOS: 21 days    Brayton El PA-C 02/17/2012 10:53 AM

## 2012-02-17 NOTE — Progress Notes (Signed)
Patient ID: Vanessa [name], female   DOB: Apr 05, 1976, 36 y.o.   MRN: [id number] 13 Days Post-Op  Subjective: Still with some incisional soreness.  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 and liquid in bag, all 3 drains still have purulent output in them  Lab Results:   Basename 02/17/12 0625 02/15/12 0630  WBC 12.9* 14.8*  HGB 8.7* 8.5*  HCT 25.0* 23.9*  PLT 139* 240   BMET  Basename 02/15/12 0630  NA 125*  K 3.3*  CL 87*  CO2 28  GLUCOSE 101*  BUN 5*  CREATININE 0.34*  CALCIUM 9.7   PT/INR No results found for this basename: LABPROT:2,INR:2 in the last 72 hours CMP     Component Value Date/Time   NA 125* 02/15/2012 0630   K 3.3* 02/15/2012 0630   CL 87* 02/15/2012 0630   CO2 28 02/15/2012 0630   GLUCOSE 101* 02/15/2012 0630   BUN 5* 02/15/2012 0630   CREATININE 0.34* 02/15/2012 0630   CALCIUM 9.7 02/15/2012 0630   CALCIUM 10.7* 02/01/2012 0705   PROT 5.7* 02/10/2012 0600   ALBUMIN 1.8* 02/10/2012 0600   AST 37 02/10/2012 0600   ALT 27 02/10/2012 0600   ALKPHOS 83 02/10/2012 0600   BILITOT 1.0 02/10/2012 0600   GFRNONAA >90 02/15/2012 0630   GFRAA >90 02/15/2012 0630   Lipase     Component Value Date/Time   LIPASE 16 11/13/2011 1007       Studies/Results: No results found.  Anti-infectives: Anti-infectives     Start     Dose/Rate Route Frequency Ordered Stop   02/12/12 0800  piperacillin-tazobactam (ZOSYN) IVPB 3.375 g       3.375 g 12.5 mL/hr over 240  Minutes Intravenous 3 times per day 02/12/12 0800     02/10/12 1000   amoxicillin-clavulanate (AUGMENTIN) 875-125 MG per tablet 1 tablet  Status:  Discontinued        1 tablet Oral Every 12 hours 02/10/12 0823 02/12/12 0800   02/04/12 1800   cefOXitin (MEFOXIN) 1 g in dextrose 5 % 50 mL IVPB        1 g 100 mL/hr over 30 Minutes Intravenous Every 6 hours 02/04/12 1621 02/05/12 0617   02/04/12 0000   cefOXitin (MEFOXIN) 2 g in dextrose 5 % 50 mL IVPB        2 g 100 mL/hr over 30 Minutes Intravenous 60 min pre-op 02/03/12 0333 02/04/12 1145   02/03/12 0500   cefOXitin (MEFOXIN) 2 g in dextrose 5 % 50 mL IVPB  Status:  Discontinued        2 g 100 mL/hr over 30 Minutes Intravenous 60 min pre-op 02/02/12 1156 02/03/12 0333   02/03/12 0000   cefOXitin (MEFOXIN) 2 g in dextrose 5 % 50 mL IVPB  Status:  Discontinued        2 g 100 mL/hr over 30 Minutes Intravenous 60 min pre-op 02/02/12 1004 02/02/12 1156   01/27/12 2200  cefTRIAXone (ROCEPHIN) 1 g in dextrose 5 % 50 mL IVPB        1 g 100 mL/hr over 30 Minutes Intravenous Every 24 hours 01/27/12 2152 01/29/12 2359   01/27/12 1915   cefTRIAXone (ROCEPHIN) 1 g in dextrose 5 % 50 mL IVPB        1 g 100 mL/hr over 30 Minutes Intravenous  Once 01/27/12 1914 01/27/12 1957           Assessment/Plan  1. S/p ex lap with sig colectomy and diverting ileostomy, now with anastomotic breakdown and multiple pelvic fluid collections, s/p perc drain x 3-purulent output continues.    Plan:  Continue IV abxs and drainage.  Recheck CBC 5/29.  Repeat CT when drainage decreases and is less purulent appearing.   LOS: 21 days    Denim Start J 02/17/2012

## 2012-02-18 NOTE — Consult Note (Signed)
WOC ostomy consult  Stoma type/location: Ileostomy Stomal assessment/size:Red and viable, 11/2inches, above skin level, oval shaped. Peristomal assessment: Intact skin surrounding. Output  50cc liquid brown stool. Ostomy pouching:/2pc.applied, but pt prefers to use 1 piece at home.  Education provided: Demonstrated pouch application.  Pt participated and able to open and close to empty with velcro.  Discussed pouching routines and obtaining pouching materials.  Supplies ordered to bedside for staff use.   Cammie Mcgee, RN, MSN, [employer]  [phone]

## 2012-02-18 NOTE — Progress Notes (Signed)
Change out drain tubing. Continue IV antibiotics.

## 2012-02-18 NOTE — Progress Notes (Signed)
14 Days Post-Op  Subjective: Feeling fair, complaining of stinking drains. Will try to exchange out later today to see if helps.   Objective: Vital signs in last 24 hours: Temp:  [98 F (36.7 C)-98.4 F (36.9 C)] 98.2 F (36.8 C) (05/28 0539) Pulse Rate:  [94-97] 97  (05/28 0539) Resp:  [16-18] 16  (05/28 0539) BP: (120-128)/(78-85) 120/78 mmHg (05/28 0539) SpO2:  [97 %-100 %] 99 % (05/28 0539) Last BM Date: 02/18/12  Intake/Output from previous day: 05/27 0701 - 05/28 0700 In: 1516.3 [P.O.:122; I.V.:1193.3; IV Piggyback:171] Out: 2590 [Urine:1700; Drains:240; Stool:650] Intake/Output this shift:   RUQ: 25 ml out put yesterday - milky drainage in JP upon exam LUQ: 35 ml output yesterday - less purulent with serous standing in JP - partially charged LLQ: 180 Ml output yesterday - frank feculent drainage in bag - fairly odorous.   Tender at drain sites, positive bowel sounds.   Lab Results:   [facility] 02/17/12 0625  WBC 12.9*  HGB 8.7*  HCT 25.0*  PLT 139*    Anti-infectives: Anti-infectives     Start     Dose/Rate Route Frequency Ordered Stop   02/12/12 0800   piperacillin-tazobactam (ZOSYN) IVPB 3.375 g        3.375 g 12.5 mL/hr over 240 Minutes Intravenous 3 times per day 02/12/12 0800     02/10/12 1000   amoxicillin-clavulanate (AUGMENTIN) 875-125 MG per tablet 1 tablet  Status:  Discontinued        1 tablet Oral Every 12 hours 02/10/12 0823 02/12/12 0800   02/04/12 1800   cefOXitin (MEFOXIN) 1 g in dextrose 5 % 50 mL IVPB        1 g 100 mL/hr over 30 Minutes Intravenous Every 6 hours 02/04/12 1621 02/05/12 0617   02/04/12 0000   cefOXitin (MEFOXIN) 2 g in dextrose 5 % 50 mL IVPB        2 g 100 mL/hr over 30 Minutes Intravenous 60 min pre-op 02/03/12 0333 02/04/12 1145   02/03/12 0500   cefOXitin (MEFOXIN) 2 g in dextrose 5 % 50 mL IVPB  Status:  Discontinued        2 g 100 mL/hr over 30 Minutes Intravenous 60 min pre-op 02/02/12 1156 02/03/12 0333   02/03/12 0000   cefOXitin (MEFOXIN) 2 g in dextrose 5 % 50 mL IVPB  Status:  Discontinued        2 g 100 mL/hr over 30 Minutes Intravenous 60 min pre-op 02/02/12 1004 02/02/12 1156   01/27/12 2200   cefTRIAXone (ROCEPHIN) 1 g in dextrose 5 % 50 mL IVPB        1 g 100 mL/hr over 30 Minutes Intravenous Every 24 hours 01/27/12 2152 01/29/12 2359   01/27/12 1915   cefTRIAXone (ROCEPHIN) 1 g in dextrose 5 % 50 mL IVPB        1 g 100 mL/hr over 30 Minutes Intravenous  Once 01/27/12 1914 01/27/12 1957          Assessment/Plan: s/p Procedure(s): multiple drains for intra-abdominal abscesses - LLQ with definitive fistula per exam, other two drains starting to decrease in output with purulent drainage still.  Leukocytosis improving. To continue drains - repeat CT likely by end of week. Will change out drainage bags later today.   Vanessa [name] 02/18/2012

## 2012-02-18 NOTE — Progress Notes (Signed)
Patient ID: Vanessa [name], female   DOB: [birth year], 36 y.o.   MRN: [id number] 14 Days Post-Op  Subjective: Pt feels nauseated secondary to smell coming from her drains.  Otherwise pain is improving.  Objective: Vital signs in last 24 hours: Temp:  [98 F (36.7 C)-98.4 F (36.9 C)] 98.2 F (36.8 C) (05/28 0539) Pulse Rate:  [94-97] 97  (05/28 0539) Resp:  [16-18] 16  (05/28 0539) BP: (120-128)/(78-85) 120/78 mmHg (05/28 0539) SpO2:  [97 %-100 %] 99 % (05/28 0539) Last BM Date: 02/18/12  Intake/Output from previous day: 05/27 0701 - 05/28 0700 In: 1516.3 [P.O.:122; I.V.:1193.3; IV Piggyback:171] Out: 2590 [Urine:1700; Drains:240; Stool:650] Intake/Output this shift: Total I/O In: 240 [P.O.:240] Out: -   PE: Abd: soft, less tender, wound is clean, ostomy is working well.  Largest drain bag with purulent, feculent output, other JPs with minimal but tan purulent output as well.  Very malodorous!!!!  Lab Results:   Basename 02/17/12 0625  WBC 12.9*  HGB 8.7*  HCT 25.0*  PLT 139*   BMET No results found for this basename: NA:2,K:2,CL:2,CO2:2,GLUCOSE:2,BUN:2,CREATININE:2,CALCIUM:2 in the last 72 hours PT/INR No results found for this basename: LABPROT:2,INR:2 in the last 72 hours CMP     Component Value Date/Time   NA 125* 02/15/2012 0630   K 3.3* 02/15/2012 0630   CL 87* 02/15/2012 0630   CO2 28 02/15/2012 0630   GLUCOSE 101* 02/15/2012 0630   BUN 5* 02/15/2012 0630   CREATININE 0.34* 02/15/2012 0630   CALCIUM 9.7 02/15/2012 0630   CALCIUM 10.7* 02/01/2012 0705   PROT 5.7* 02/10/2012 0600   ALBUMIN 1.8* 02/10/2012 0600   AST 37 02/10/2012 0600   ALT 27 02/10/2012 0600   ALKPHOS 83 02/10/2012 0600   BILITOT 1.0 02/10/2012 0600   GFRNONAA >90 02/15/2012 0630   GFRAA >90 02/15/2012 0630   Lipase     Component Value Date/Time   LIPASE 16 11/13/2011 1007       Studies/Results: No results found.  Anti-infectives: Anti-infectives     Start     Dose/Rate Route Frequency  Ordered Stop   02/12/12 0800  piperacillin-tazobactam (ZOSYN) IVPB 3.375 g       3.375 g 12.5 mL/hr over 240 Minutes Intravenous 3 times per day 02/12/12 0800     02/10/12 1000   amoxicillin-clavulanate (AUGMENTIN) 875-125 MG per tablet 1 tablet  Status:  Discontinued        1 tablet Oral Every 12 hours 02/10/12 0823 02/12/12 0800   02/04/12 1800   cefOXitin (MEFOXIN) 1 g in dextrose 5 % 50 mL IVPB        1 g 100 mL/hr over 30 Minutes Intravenous Every 6 hours 02/04/12 1621 02/05/12 0617   02/04/12 0000   cefOXitin (MEFOXIN) 2 g in dextrose 5 % 50 mL IVPB        2 g 100 mL/hr over 30 Minutes Intravenous 60 min pre-op 02/03/12 0333 02/04/12 1145   02/03/12 0500   cefOXitin (MEFOXIN) 2 g in dextrose 5 % 50 mL IVPB  Status:  Discontinued        2 g 100 mL/hr over 30 Minutes Intravenous 60 min pre-op 02/02/12 1156 02/03/12 0333   02/03/12 0000   cefOXitin (MEFOXIN) 2 g in dextrose 5 % 50 mL IVPB  Status:  Discontinued        2 g 100 mL/hr over 30 Minutes Intravenous 60 min pre-op 02/02/12 1004 02/02/12 1156   01/27/12 2200   cefTRIAXone (ROCEPHIN)  1 g in dextrose 5 % 50 mL IVPB        1 g 100 mL/hr over 30 Minutes Intravenous Every 24 hours 01/27/12 2152 01/29/12 2359   01/27/12 1915   cefTRIAXone (ROCEPHIN) 1 g in dextrose 5 % 50 mL IVPB        1 g 100 mL/hr over 30 Minutes Intravenous  Once 01/27/12 1914 01/27/12 1957           Assessment/Plan  1. S/p sigm colectomy with diverting ileostomy with abdominal fluid collections likely from anastomotic breakdown  Plan: 1. Repeat labs in the morning 2. Cont abx therapy 3. She will need repeat CT scan at some point, LLQ drain with 180cc/24hr period.    LOS: 22 days    Demauri Advincula E 02/18/2012

## 2012-02-19 ENCOUNTER — Inpatient Hospital Stay (HOSPITAL_COMMUNITY): Payer: Medicaid Other

## 2012-02-19 DIAGNOSIS — D696 Thrombocytopenia, unspecified: Secondary | ICD-10-CM

## 2012-02-19 DIAGNOSIS — D649 Anemia, unspecified: Secondary | ICD-10-CM

## 2012-02-19 LAB — APTT: aPTT: 40 seconds — ABNORMAL HIGH (ref 24–37)

## 2012-02-19 LAB — CBC
HCT: 21 % — ABNORMAL LOW (ref 36.0–46.0)
Hemoglobin: 7.3 g/dL — ABNORMAL LOW (ref 12.0–15.0)
MCH: 29.2 pg (ref 26.0–34.0)
MCH: 29.6 pg (ref 26.0–34.0)
MCH: 29.3 pg (ref 26.0–34.0)
MCHC: 34.2 g/dL (ref 30.0–36.0)
MCHC: 34.8 g/dL (ref 30.0–36.0)
MCHC: 34.3 g/dL (ref 30.0–36.0)
MCV: 85 fL (ref 78.0–100.0)
MCV: 85.4 fL (ref 78.0–100.0)
MCV: 84.9 fL (ref 78.0–100.0)
Platelets: 10 10*3/uL — CL (ref 150–400)
Platelets: <5 10*3/uL — CL (ref 150–400)
RBC: 2.39 MIL/uL — ABNORMAL LOW (ref 3.87–5.11)
RDW: 15.4 % (ref 11.5–15.5)
WBC: 15.3 10*3/uL — ABNORMAL HIGH (ref 4.0–10.5)

## 2012-02-19 LAB — PREPARE RBC (CROSSMATCH)

## 2012-02-19 MED ORDER — BACITRACIN-NEOMYCIN-POLYMYXIN 400-5-5000 EX OINT
TOPICAL_OINTMENT | CUTANEOUS | Status: AC
  Administered 2012-02-19: 15:00:00
  Filled 2012-02-19: qty 1

## 2012-02-19 MED ORDER — METRONIDAZOLE IN NACL 5-0.79 MG/ML-% IV SOLN
500.0000 mg | Freq: Three times a day (TID) | INTRAVENOUS | Status: DC
  Administered 2012-02-19 – 2012-02-26 (×21): 500 mg via INTRAVENOUS
  Filled 2012-02-19 (×25): qty 100

## 2012-02-19 MED ORDER — CIPROFLOXACIN IN D5W 400 MG/200ML IV SOLN
400.0000 mg | Freq: Two times a day (BID) | INTRAVENOUS | Status: DC
  Administered 2012-02-19 – 2012-02-20 (×2): 400 mg via INTRAVENOUS
  Filled 2012-02-19 (×4): qty 200

## 2012-02-19 MED ORDER — ACETAMINOPHEN 325 MG PO TABS
650.0000 mg | ORAL_TABLET | Freq: Once | ORAL | Status: AC
  Administered 2012-02-19: 650 mg via ORAL
  Filled 2012-02-19: qty 2

## 2012-02-19 MED ORDER — IOHEXOL 300 MG/ML  SOLN
20.0000 mL | INTRAMUSCULAR | Status: AC
  Administered 2012-02-19: 20 mL via ORAL

## 2012-02-19 MED ORDER — DIPHENHYDRAMINE HCL 25 MG PO CAPS
25.0000 mg | ORAL_CAPSULE | Freq: Once | ORAL | Status: AC
  Administered 2012-02-19: 25 mg via ORAL
  Filled 2012-02-19: qty 1

## 2012-02-19 MED ORDER — IOHEXOL 300 MG/ML  SOLN
100.0000 mL | Freq: Once | INTRAMUSCULAR | Status: AC | PRN
  Administered 2012-02-19: 100 mL via INTRAVENOUS

## 2012-02-19 MED ORDER — WHITE PETROLATUM GEL
Status: AC
  Filled 2012-02-19: qty 5

## 2012-02-19 NOTE — Progress Notes (Signed)
CRITICAL VALUE ALERT  Critical value received: Hgb 6.7, Hct 19.1, Platelets <5  Date of notification: 5.29.13  Time of notification: 1750  Critical value read back:yes  Nurse who received alert: L. Araceli Bouche, RN  MD notified (1st page): Dr. [name]  Time of first page: 1751  MD notified (2nd page):  Time of second page:  Responding MD: Dr. [name]  Time MD responded: [phone]

## 2012-02-19 NOTE — Progress Notes (Signed)
Transferred pt to 2102. FFP infusing on transfer. Pt alert/oriented.

## 2012-02-19 NOTE — Progress Notes (Signed)
Agree with above. Bulb to suction, CT scan.

## 2012-02-19 NOTE — Progress Notes (Signed)
Nutrition Follow-up  Diet Order:  Regular  Pt has had documented poor intake since admission ~3 weeks ago (5/6).  Pt was admitted with nausea, vomiting found to have abscesses and anastomotic leak.  Pt s/p colon resection with diverting loop ileostomy (5/14) but continues with abdominal pain and bloody BMs. PO intake: 0-25% of meals.  Pt has not been able to sustain adequate intake since admission, requiring frequent diet changes, and refusing supplements due to abdominal pain.  Pt has continued with MVI daily.  Meds: Scheduled Meds:   . amLODipine  10 mg Oral Daily  . feeding supplement  237 mL Oral BID BM  . hydrochlorothiazide  12.5 mg Oral Daily  . iohexol  20 mL Oral Q1 Hr x 2  . morphine  15 mg Oral QHS  . morphine  30 mg Oral Daily  . mulitivitamin with minerals  1 tablet Oral Daily  . neomycin-bacitracin-polymyxin      . piperacillin-tazobactam (ZOSYN)  IV  3.375 g Intravenous Q8H  . white petrolatum      . DISCONTD: enoxaparin  40 mg Subcutaneous Q24H   Continuous Infusions:   . dextrose 5 % and 0.45 % NaCl with KCl 20 mEq/L 50 mL/hr at 02/18/12 0555   PRN Meds:.acetaminophen, alum & mag hydroxide-simeth, diphenhydrAMINE, HYDROmorphone (DILAUDID) injection, iohexol, ondansetron (ZOFRAN) IV, ondansetron, oxyCODONE-acetaminophen, simethicone, zolpidem  Labs:  CMP     Component Value Date/Time   NA 125* 02/15/2012 0630   K 3.3* 02/15/2012 0630   CL 87* 02/15/2012 0630   CO2 28 02/15/2012 0630   GLUCOSE 101* 02/15/2012 0630   BUN 5* 02/15/2012 0630   CREATININE 0.34* 02/15/2012 0630   CALCIUM 9.7 02/15/2012 0630   CALCIUM 10.7* 02/01/2012 0705   PROT 5.7* 02/10/2012 0600   ALBUMIN 1.8* 02/10/2012 0600   AST 37 02/10/2012 0600   ALT 27 02/10/2012 0600   ALKPHOS 83 02/10/2012 0600   BILITOT 1.0 02/10/2012 0600   GFRNONAA >90 02/15/2012 0630   GFRAA >90 02/15/2012 0630     Intake/Output Summary (Last 24 hours) at 02/19/12 1510 Last data filed at 02/19/12 1439  Gross per 24 hour   Intake    888 ml  Output   1867 ml  Net   -979 ml    Weight Status:  Trending wt loss Admission wt: 158 lbs Usual wt: 161 lbs Per documentation, pt appears to be euvolemic.  Re-estimated needs:  1800-2000 kcal, 75-85g protein  Nutrition Dx:  Inadequate oral intake, ongoing  Intervention:   1.  Nutrition support; pt has been unable to demonstrate diet tolerance with adequate intake for several days.  Pt with concerning wt loss of 3.2% usual body weight and continues to refuse meals.  PO intake 0% of meals today.  Recommend consideration of at least partial nutrition support.  Please consult RD if recommendations for semi-elemental formula warranted.  Pt would likely tolerate parenteral nutrition more consistently although enteral route preferred.  Monitor:   1.  Food/Beverage; improvement in intake with resolve of abdominal pain 2.  Wt/wt change; pt demonstrating wt loss, follow trends.   Hoyt Koch Pager #:  [phone]

## 2012-02-19 NOTE — Progress Notes (Signed)
Pharmacy Consult: Medications this pt has been taking during this hospitalization that could induce acute thrombocytopenia.  The following meds that this pt has taken during this hospitalization have a 1% or smaller incidence of inducing thrombocytopenia: Metronidazole Amoxicillin and clavulanate Amlodipine Enoxaparin Hydrochlorothiazide Ketorolac Ceftriaxone Cefoxitin Piperacillin and Tazobactam  Cardell Peach, PharmD

## 2012-02-19 NOTE — Progress Notes (Addendum)
Subjective: Pt not feeling good this am. Feels hot but no fevers noted. Having some bloody BMs too.  Objective: Physical Exam: BP 118/81  Pulse 121  Temp(Src) 98.9 F (37.2 C) (Oral)  Resp 18  Ht 5\' 4"  (1.626 m)  Wt 156 lb 12 oz (71.1 kg)  BMI 26.91 kg/m2  SpO2 100% R and L upper abd drains look purulent, output about 30cc each LLQ drain looks like thin bloody stool- output 95cc.    Labs: CBC  Basename 02/19/12 0907 02/19/12 0606  WBC 17.3* 17.8*  HGB 7.3* 7.8*  HCT 21.0* 22.8*  PLT PENDING QUESTIONABLE RESULTS, RECOMMEND RECOLLECT TO VERIFY   BMET No results found for this basename: NA:2,K:2,CL:2,CO2:2,GLUCOSE:2,BUN:2,CREATININE:2,CALCIUM:2 in the last 72 hours LFT No results found for this basename: PROT,ALBUMIN,AST,ALT,ALKPHOS,BILITOT,BILIDIR,IBILI,LIPASE in the last 72 hours PT/INR No results found for this basename: LABPROT:2,INR:2 in the last 72 hours   Studies/Results: No results found.  Assessment/Plan: Multiple intra-abdominal abscesses from suspected anastomotic breakdown. S/p perc drains X 3 5/23 Change in output of LLQ drain WBC trending up, Hgb down. PLTs <5k, repeat CBC pending CCS ordering CT today, will follow. CCS also requests LLQ drain be placed to bulb suction to try and evacuate more drainage.    LOS: 23 days    Brayton El PA-C 02/19/2012 10:13 AM

## 2012-02-19 NOTE — Progress Notes (Signed)
After talking to MD on call @0130 ,bloody stools per rectum x 5.

## 2012-02-19 NOTE — Consult Note (Signed)
Reason for Consult: new thrombocytopenia with GI bleeding Referring Physician: Donell Beers Other physicians: Laurette Schimke, M.Manning, C.Solstice Vanessa [name] is an 36 y.o. female whom I followed  during treatment of IB2 squamous cell carcinoma of cervix with radiation given with sensitizing CDDP chemotherapy from Nov 2011 thru Jan 2012. She had normal platelet counts thru Jan 2012, tho she did need some PRBC transfusions to keep hemoglobin in adequate range for RT. She did not keep follow up appointments at my office after Jan 2012, tho she did see Dr.Brewster in April 2013. Dr Nelly Rout recommended GI evaluation then due to abdominal pain and rectal bleeding, however there was delay in that referral.  Patient was admitted 5-6-013 with abdominal pain, nausea, vomiting and some fever. CT showed 10-15 cm abnormal segment of sigmoid colon and probable obstruction. 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 She went to resection of sigmoid with diverting loop ileostomy by Dr [name] on 02-04-12, with that area of bowel described as fibrotic and friable. The operative note does not mention any obvious recurrent cancer and pathology ([id number]) showed full thickness necrosis and abscess formation consistent with radiation changes. She slowly progressed initially, with ostomy functioning well, but required placement of 3 drains by IR for abscess collections in left lower pelvis, left mid abdomen and right mid abdomen on 02-13-12. Repeat CT AP today showed persistent pneumoperitoneum, improvement in previous largest lower quadrant abscesses with new dominant abscess in left anterior pararenal area and additional smaller abscesses thruout the abdomen with adjacent intraperitoneal gas. Patient has been on multiple antibiotics thru this course, most recently zosyn since 02-12-12 which has just been [facility] now. Platelet count had been in  normal range thru 02-15-12 when this was 240k, then was 139k on 5-27, then  <5k 0900 today by peripheral draw. She had repeat peripheral draw at 1140, with platelets 10k. Hemoglobin was 8.7 on 02-17-12, 7.8 on first draw this AM and 7.0 on 1140 specimen. She had at least 3 bloody stools last night and one large bloody stool with clots this afternoon. Last INR was 1.27 and PTT 35 on 02-13-12. She has had no other noted bleeding. She has had no platelet or PRBC transfusions in past 24 hrs. I believe that she has been off SQ heparin since sometime prior to 02-12-12.  Review of Systems: continued mid and low abdominal pain not worse today than last few days. Tolerating regular diet. No cough or increased shortness of breath in bed on RA. Is voiding. Has had stool in ostomy. No other pain. No other noted bleeding.   Allergies:  Allergies  Allergen Reactions  . Labetalol Hcl Itching and Other (See Comments)    Bumps to bilateral arms.  . Morphine And Related Hives   Past Medical History  Diagnosis Date  . Hypertension   . Cervical cancer   Gravida 7 para 8 (one set twins) Last normal PAP had been with pregnancy 2010  Past Surgical History  Procedure Date  . Cholecystectomy   . Radiation implants   . Colonoscopy 01/30/2012    Procedure: COLONOSCOPY;  Surgeon: Beverley Fiedler, MD;  Location: [facility] ENDOSCOPY;  Service: Gastroenterology;  Laterality: N/A;  . Colonoscopy 01/30/2012    Procedure: COLONOSCOPY;  Surgeon: Beverley Fiedler, MD;  Location[facility];  Service: Gastroenterology;  Laterality: N/A;  . Colostomy revision 02/04/2012    Procedure: COLON RESECTION SIGMOID;  Surgeon: Cherylynn Ridges,  MD;  Location: [facility] OR;  Service: General;  Laterality: N/A;  BTL 2006 HIV negative with last pregnancy  Family History: Mother HTN, heart disease in brother, asthma in children. "bone cancer" in aunt.  Social History: originally from [place], has been in [place] x 6 yrs, single. Several family members in [place]. Eight children ages ~  3 to 54, 4 boys and 4 girls.Cigarettes up to 1.5 ppd x 16 years, which she has previously tried to stop.Some history of ETOH and marijuana. Not employed. Medications: reviewed in EMR and I have requested pharmacy consult to review also.  Blood pressure 114/75, pulse 105, temperature 98.2 F (36.8 C), temperature source Oral, resp. rate 18, height 5\' 4"  (1.626 m), weight 156 lb 12 oz (71.1 kg), SpO2 100.00%. Awake, alert, looks mildly uncomfortable but not acutely so, supine in bed on RA. Able to give good history.  HEENT: PERRL, not icteric, mouth somewhat dry/ possible thrush. Mucous membranes pale. No JVD, no cervical or supraclavicular adenopathy. Lungs clear anteriorly and laterally Heart RRR, tachy, clear heart sounds, no gallop Abdomen seems somewhat distended, quiet, soft, not markedly tender. Ostomy with formed stool on right. Incision with staples, dressing dry. JP drains bilaterally, no bleeding or drainage around those.  No inguinal adenopathy. LE no edema, cords, tenderness. No bleeding around IVs. No significant bruising and no petechiae seen.    Results for orders placed during the hospital encounter of 01/27/12 (from the past 48 hour(s))  CBC     Status: Abnormal   Collection Time   02/19/12  6:06 AM      Component Value Range Comment   WBC 17.8 (*) 4.0 - 10.5 (K/uL)    RBC 2.67 (*) 3.87 - 5.11 (MIL/uL) QUESTIONABLE RESULTS, RECOMMEND RECOLLECT TO VERIFY   Hemoglobin 7.8 (*) 12.0 - 15.0 (g/dL) QUESTIONABLE RESULTS, RECOMMEND RECOLLECT TO VERIFY   HCT 22.8 (*) 36.0 - 46.0 (%) QUESTIONABLE RESULTS, RECOMMEND RECOLLECT TO VERIFY   MCV 85.4  78.0 - 100.0 (fL) QUESTIONABLE RESULTS, RECOMMEND RECOLLECT TO VERIFY   MCH 29.2  26.0 - 34.0 (pg) QUESTIONABLE RESULTS, RECOMMEND RECOLLECT TO VERIFY   MCHC 34.2  30.0 - 36.0 (g/dL) QUESTIONABLE RESULTS, RECOMMEND RECOLLECT TO VERIFY   RDW 15.4  11.5 - 15.5 (%) QUESTIONABLE RESULTS, RECOMMEND RECOLLECT TO VERIFY   Platelets    150 - 400  (K/uL)    Value: QUESTIONABLE RESULTS, RECOMMEND RECOLLECT TO VERIFY  CBC     Status: Abnormal   Collection Time   02/19/12  9:07 AM      Component Value Range Comment   WBC 17.3 (*) 4.0 - 10.5 (K/uL)    RBC 2.47 (*) 3.87 - 5.11 (MIL/uL)    Hemoglobin 7.3 (*) 12.0 - 15.0 (g/dL)    HCT 54.0 (*) 98.1 - 46.0 (%)    MCV 85.0  78.0 - 100.0 (fL)    MCH 29.6  26.0 - 34.0 (pg)    MCHC 34.8  30.0 - 36.0 (g/dL)    RDW 19.1  47.8 - 29.5 (%)    Platelets <5 (*) 150 - 400 (K/uL)   CBC     Status: Abnormal   Collection Time   02/19/12 11:43 AM      Component Value Range Comment   WBC 17.0 (*) 4.0 - 10.5 (K/uL) WHITE COUNT CONFIRMED ON SMEAR   RBC 2.39 (*) 3.87 - 5.11 (MIL/uL)    Hemoglobin 7.0 (*) 12.0 - 15.0 (g/dL)    HCT 62.1 (*) 30.8 - 46.0 (%)  MCV 85.4  78.0 - 100.0 (fL)    MCH 29.3  26.0 - 34.0 (pg)    MCHC 34.3  30.0 - 36.0 (g/dL)    RDW 57.8  46.9 - 62.9 (%)    Platelets 10 (*) 150 - 400 (K/uL)    Peripheral smear from 1140 reviewed now: I see 2 platelets total on multiple fields, normal size. No schistocytes, RBCs otherwise not remarkable. WBCs not remarkable without immature forms.  Last BMET 02-15-12 had Na 125, K3.3, chloride 87, CO@ 28, glucose 101, BUN 5 and creat 0.3  Ct Abdomen Pelvis W Contrast  02/19/2012  *RADIOLOGY REPORT*  Clinical Data: Elevated white count, pelvic abscesses  CT ABDOMEN AND PELVIS WITH CONTRAST  Technique:  Multidetector CT imaging of the abdomen and pelvis was performed following the standard protocol during bolus administration of intravenous contrast.  Contrast: OMNIPAQUE IOHEXOL 300 MG/ML  SOLN  Comparison: Feb 13, 2012 and Feb 12, 2012  Findings: Bibasilar atelectasis and small pleural effusions remain present.  There is persistent pneumoperitoneum, scattered throughout the abdomen and pelvis.  There also multiple loculated fluid collections throughout the abdomen, some of which contain gas.  Gas also tracks along the subumbilical soft tissues and  has increased. Drainage tubes remain present within both lower quadrants.  The largest fluid collections within both lower quadrants have decreased slightly in size but the one on the right contains more gas now and tracts further along the lateral liver margin.  There is a new dominant abscess in the left anterior pararenal space.  The proximal duodenum and stomach are distended, likely secondary to mechanical obstruction  or inflammatory ileus.  There is also mild bilateral hydronephrosis, and a fluid collection within the left hemi pelvis is larger now.  The right lower quadrant ileostomy is stable in appearance.  The osseous structures, bladder, pancreas, adrenal glands, liver, spleen are unremarkable.  IMPRESSION: Persistent pneumoperitoneum is again worrisome for anastomotic breakdown.  The largest abscesses within both lower quadrants have decreased in size after drainage tube placement; however, there is a new dominant abscess in the left anterior pararenal space. Additional smaller abscesses also remain present throughout the abdomen.  Tracking of gas in the subumbilical soft tissues has increased and there is adjacent intraperitoneal gas which is worrisome for fistula formation.  Original Report Authenticated By: Brandon Melnick, M.D.   I have discussed with Dr Donell Beers by phone now. Patient will be transfused platelets and PRBCs, with serial CBCs followed. Would recheck PT/PTT. Stop zosyn; cipro + flagyl ordered by Dr Donell Beers now. I have asked pharmacy to review meds for other possible causes of the thrombocytopenia. May need GI to see again --  Note some initial recommendations for the radiation proctitis after the colonoscopy attempts.  Patient is in agreement with transfusions.    Assessment/Plan: 1.Profound thrombocytopenia new since 02-15-12 with multiple bloody bowel movements in past 24 hours, in very complicated patient with recent sigmoid colon resection and multiple intraabdominal abscesses.  Likely drug related, not clinically in DIC and does not appear septic now. Transfuse platelets to at least 50-75k tonight, and additional if further bleeding.  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 4.history of IB2 squamous cell carcinoma of cervix: no apparent active disease now 5.long tobacco 6.poor nutritional status related to above, as evidenced by low BUN and creatinine above 7. Electrolyte abnormalities by last chemistries: CMET  ordered. May need NS in preference to D5 /0.45 NS  Will follow with you.  Thank you. Kymberlyn Eckford P 02/19/2012, 4:13 PM

## 2012-02-19 NOTE — Progress Notes (Signed)
Patient ID: Vanessa [name], female   DOB: 12/30/1975, 36 y.o.   MRN: [id number] 15 Days Post-Op  Subjective: Pt doesn't feel well today.  Increasing abdomina pain.  Had 5 bloody BMs last pm from rectum.  Objective: Vital signs in last 24 hours: Temp:  [98.4 F (36.9 C)-99.1 F (37.3 C)] 98.9 F (37.2 C) (05/29 0541) Pulse Rate:  [99-121] 121  (05/29 0541) Resp:  [18] 18  (05/29 0541) BP: (118-125)/(80-81) 118/81 mmHg (05/29 0541) SpO2:  [100 %] 100 % (05/29 0541) Last BM Date: 02/19/12  Intake/Output from previous day: 05/28 0701 - 05/29 0700 In: 1572 [P.O.:600; I.V.:972] Out: 2132 [Urine:1300; Drains:155; Stool:677] Intake/Output this shift: Total I/O In: 100 [P.O.:100] Out: -   PE: Abd: soft, tender, wound is clean, ostomy in place, 2 JP bulbs with milky cloudy output.  LLQ bag drain has frank dark brown stool with likely some old blood present as well.  Lab Results:   Basename 02/19/12 0907 02/19/12 0606  WBC 17.3* 17.8*  HGB 7.3* 7.8*  HCT 21.0* 22.8*  PLT <5* QUESTIONABLE RESULTS, RECOMMEND RECOLLECT TO VERIFY   BMET No results found for this basename: NA:2,K:2,CL:2,CO2:2,GLUCOSE:2,BUN:2,CREATININE:2,CALCIUM:2 in the last 72 hours PT/INR No results found for this basename: LABPROT:2,INR:2 in the last 72 hours CMP     Component Value Date/Time   NA 125* 02/15/2012 0630   K 3.3* 02/15/2012 0630   CL 87* 02/15/2012 0630   CO2 28 02/15/2012 0630   GLUCOSE 101* 02/15/2012 0630   BUN 5* 02/15/2012 0630   CREATININE 0.34* 02/15/2012 0630   CALCIUM 9.7 02/15/2012 0630   CALCIUM 10.7* 02/01/2012 0705   PROT 5.7* 02/10/2012 0600   ALBUMIN 1.8* 02/10/2012 0600   AST 37 02/10/2012 0600   ALT 27 02/10/2012 0600   ALKPHOS 83 02/10/2012 0600   BILITOT 1.0 02/10/2012 0600   GFRNONAA >90 02/15/2012 0630   GFRAA >90 02/15/2012 0630   Lipase     Component Value Date/Time   LIPASE 16 11/13/2011 1007       Studies/Results: No results found.  Anti-infectives: Anti-infectives      Start     Dose/Rate Route Frequency Ordered Stop   02/12/12 0800  piperacillin-tazobactam (ZOSYN) IVPB 3.375 g       3.375 g 12.5 mL/hr over 240 Minutes Intravenous 3 times per day 02/12/12 0800     02/10/12 1000   amoxicillin-clavulanate (AUGMENTIN) 875-125 MG per tablet 1 tablet  Status:  Discontinued        1 tablet Oral Every 12 hours 02/10/12 0823 02/12/12 0800   02/04/12 1800   cefOXitin (MEFOXIN) 1 g in dextrose 5 % 50 mL IVPB        1 g 100 mL/hr over 30 Minutes Intravenous Every 6 hours 02/04/12 1621 02/05/12 0617   02/04/12 0000   cefOXitin (MEFOXIN) 2 g in dextrose 5 % 50 mL IVPB        2 g 100 mL/hr over 30 Minutes Intravenous 60 min pre-op 02/03/12 0333 02/04/12 1145   02/03/12 0500   cefOXitin (MEFOXIN) 2 g in dextrose 5 % 50 mL IVPB  Status:  Discontinued        2 g 100 mL/hr over 30 Minutes Intravenous 60 min pre-op 02/02/12 1156 02/03/12 0333   02/03/12 0000   cefOXitin (MEFOXIN) 2 g in dextrose 5 % 50 mL IVPB  Status:  Discontinued        2 g 100 mL/hr over 30 Minutes Intravenous 60 min pre-op  02/02/12 1004 02/02/12 1156   01/27/12 2200   cefTRIAXone (ROCEPHIN) 1 g in dextrose 5 % 50 mL IVPB        1 g 100 mL/hr over 30 Minutes Intravenous Every 24 hours 01/27/12 2152 01/29/12 2359   01/27/12 1915   cefTRIAXone (ROCEPHIN) 1 g in dextrose 5 % 50 mL IVPB        1 g 100 mL/hr over 30 Minutes Intravenous  Once 01/27/12 1914 01/27/12 1957           Assessment/Plan  1. S/p ex lap with sig colectomy and diverting ileostomy.  Now with likely anastomotic breakdown 2. Leukocytosis 3. Thrombocytopenia, recheck as platelets to all of the sudden go to <5 is unlikely 4. ABL anemia 5. Rectal bleeding  Plan: 1. Given new change in drainage and increasing WBC, we will repeat a CT scan today of the abd.  Hopefully we can avoid a repeat operation, but she may need a washout. 2. Hold Lovenox given some rectal bleeding and hgb trending down. 3. Recheck platelets  today to confirm these "critical" results. 4. Will place bag drain to suction per Dr. Arita Miss request. 5. Repeat labs in the morning.   LOS: 23 days    Mannat Benedetti E 02/19/2012

## 2012-02-20 DIAGNOSIS — D696 Thrombocytopenia, unspecified: Secondary | ICD-10-CM

## 2012-02-20 DIAGNOSIS — K922 Gastrointestinal hemorrhage, unspecified: Secondary | ICD-10-CM

## 2012-02-20 DIAGNOSIS — K625 Hemorrhage of anus and rectum: Secondary | ICD-10-CM | POA: Diagnosis present

## 2012-02-20 DIAGNOSIS — D62 Acute posthemorrhagic anemia: Secondary | ICD-10-CM | POA: Diagnosis present

## 2012-02-20 LAB — PREPARE PLATELET PHERESIS

## 2012-02-20 LAB — DIFFERENTIAL
Eosinophils Absolute: 0.1 10*3/uL (ref 0.0–0.7)
Monocytes Absolute: 1.7 10*3/uL — ABNORMAL HIGH (ref 0.1–1.0)
Neutro Abs: 9.9 10*3/uL — ABNORMAL HIGH (ref 1.7–7.7)
nRBC: 3 /100 WBC — ABNORMAL HIGH

## 2012-02-20 LAB — PROTIME-INR
INR: 1.25 (ref 0.00–1.49)
Prothrombin Time: 16 seconds — ABNORMAL HIGH (ref 11.6–15.2)

## 2012-02-20 LAB — COMPREHENSIVE METABOLIC PANEL
AST: 16 U/L (ref 0–37)
Albumin: 2.2 g/dL — ABNORMAL LOW (ref 3.5–5.2)
Alkaline Phosphatase: 103 U/L (ref 39–117)
BUN: 7 mg/dL (ref 6–23)
Chloride: 89 mEq/L — ABNORMAL LOW (ref 96–112)
Potassium: 3.4 mEq/L — ABNORMAL LOW (ref 3.5–5.1)
Sodium: 126 mEq/L — ABNORMAL LOW (ref 135–145)
Total Protein: 6.5 g/dL (ref 6.0–8.3)

## 2012-02-20 LAB — CBC
HCT: 24.8 % — ABNORMAL LOW (ref 36.0–46.0)
Hemoglobin: 8.7 g/dL — ABNORMAL LOW (ref 12.0–15.0)
MCH: 29.3 pg (ref 26.0–34.0)
MCH: 29.4 pg (ref 26.0–34.0)
MCHC: 35.1 g/dL (ref 30.0–36.0)
MCHC: 35.1 g/dL (ref 30.0–36.0)
MCV: 83.5 fL (ref 78.0–100.0)
MCV: 83.8 fL (ref 78.0–100.0)
Platelets: <5 10*3/uL — CL (ref 150–400)
RDW: 15.6 % — ABNORMAL HIGH (ref 11.5–15.5)
RDW: 16.1 % — ABNORMAL HIGH (ref 11.5–15.5)

## 2012-02-20 LAB — PREPARE RBC (CROSSMATCH)

## 2012-02-20 LAB — DIC (DISSEMINATED INTRAVASCULAR COAGULATION)PANEL: Prothrombin Time: 15.6 seconds — ABNORMAL HIGH (ref 11.6–15.2)

## 2012-02-20 MED ORDER — KCL IN DEXTROSE-NACL 20-5-0.9 MEQ/L-%-% IV SOLN
INTRAVENOUS | Status: DC
  Administered 2012-02-20: 09:00:00 via INTRAVENOUS
  Filled 2012-02-20 (×2): qty 1000

## 2012-02-20 MED ORDER — HYDROMORPHONE 0.3 MG/ML IV SOLN
INTRAVENOUS | Status: DC
  Administered 2012-02-20: 35 mL via INTRAVENOUS
  Administered 2012-02-20: 2.9 mg via INTRAVENOUS
  Administered 2012-02-20: 22:00:00 via INTRAVENOUS
  Administered 2012-02-20: 4.1 mg via INTRAVENOUS
  Administered 2012-02-20: 3.6 mg via INTRAVENOUS
  Administered 2012-02-21: 4.8 mg via INTRAVENOUS
  Administered 2012-02-21: 4.2 mg via INTRAVENOUS
  Administered 2012-02-21: 2.7 mg via INTRAVENOUS
  Administered 2012-02-21: 18:00:00 via INTRAVENOUS
  Administered 2012-02-21: 4.6 mg via INTRAVENOUS
  Administered 2012-02-21 (×2): via INTRAVENOUS
  Administered 2012-02-21: 3.6 mg via INTRAVENOUS
  Administered 2012-02-22: 4.72 mg via INTRAVENOUS
  Administered 2012-02-22 (×4): via INTRAVENOUS
  Administered 2012-02-22: 2.1 mg via INTRAVENOUS
  Administered 2012-02-23: 0.9 mg via INTRAVENOUS
  Administered 2012-02-23: 23:00:00 via INTRAVENOUS
  Administered 2012-02-23: 4.5 mg via INTRAVENOUS
  Administered 2012-02-23: 0.3 mg via INTRAVENOUS
  Administered 2012-02-23: 0.6 mg via INTRAVENOUS
  Administered 2012-02-24: 2.1 mg via INTRAVENOUS
  Administered 2012-02-24: 2.4 mg via INTRAVENOUS
  Administered 2012-02-24: 3.6 mg via INTRAVENOUS
  Administered 2012-02-24: 2.1 mg via INTRAVENOUS
  Administered 2012-02-24: 10:00:00 via INTRAVENOUS
  Administered 2012-02-24: 2.7 mg via INTRAVENOUS
  Administered 2012-02-25 (×3): via INTRAVENOUS
  Administered 2012-02-26: 1.2 mg via INTRAVENOUS
  Administered 2012-02-26: 2.1 mg via INTRAVENOUS
  Administered 2012-02-26: 1.2 mg via INTRAVENOUS
  Administered 2012-02-26: 18:00:00 via INTRAVENOUS
  Administered 2012-02-27: 0.3 mg via INTRAVENOUS
  Administered 2012-02-27: 1.5 mg via INTRAVENOUS
  Administered 2012-02-27: 07:00:00 via INTRAVENOUS
  Administered 2012-02-27: 6.8 mg via INTRAVENOUS
  Administered 2012-02-27: 3 mg via INTRAVENOUS
  Administered 2012-02-27: 2.4 mg via INTRAVENOUS
  Administered 2012-02-28 (×2): 1.2 mg via INTRAVENOUS
  Filled 2012-02-20 (×21): qty 25

## 2012-02-20 MED ORDER — DEXTROSE 5 % IV SOLN
1.0000 g | INTRAVENOUS | Status: DC
  Administered 2012-02-20 – 2012-02-24 (×5): 1 g via INTRAVENOUS
  Filled 2012-02-20 (×5): qty 10

## 2012-02-20 MED ORDER — DIPHENHYDRAMINE HCL 50 MG/ML IJ SOLN: 12.5000 mg | Freq: Four times a day (QID) | INTRAMUSCULAR | Status: DC | PRN

## 2012-02-20 MED ORDER — PANTOPRAZOLE SODIUM 40 MG IV SOLR
40.0000 mg | Freq: Every day | INTRAVENOUS | Status: DC
  Administered 2012-02-20 – 2012-02-21 (×2): 40 mg via INTRAVENOUS
  Filled 2012-02-20 (×4): qty 40

## 2012-02-20 MED ORDER — NALOXONE HCL 0.4 MG/ML IJ SOLN: 0.4000 mg | INTRAMUSCULAR | Status: DC | PRN

## 2012-02-20 MED ORDER — SODIUM CHLORIDE 0.9 % IJ SOLN: 9.0000 mL | INTRAMUSCULAR | Status: DC | PRN

## 2012-02-20 MED ORDER — DIPHENHYDRAMINE HCL 12.5 MG/5ML PO ELIX
12.5000 mg | ORAL_SOLUTION | Freq: Four times a day (QID) | ORAL | Status: DC | PRN
  Filled 2012-02-20: qty 5

## 2012-02-20 MED ORDER — WHITE PETROLATUM GEL
Status: AC
  Administered 2012-02-21
  Filled 2012-02-20: qty 5

## 2012-02-20 MED ORDER — ONDANSETRON HCL 4 MG/2ML IJ SOLN
4.0000 mg | Freq: Four times a day (QID) | INTRAMUSCULAR | Status: DC | PRN
  Administered 2012-03-01 – 2012-03-07 (×3): 4 mg via INTRAVENOUS

## 2012-02-20 MED ORDER — SODIUM CHLORIDE 0.9 % IV SOLN
INTRAVENOUS | Status: DC
  Administered 2012-02-20: 15:00:00 via INTRAVENOUS
  Filled 2012-02-20 (×4): qty 1000

## 2012-02-20 MED ORDER — HYDROMORPHONE HCL PF 1 MG/ML IJ SOLN
1.0000 mg | INTRAMUSCULAR | Status: DC | PRN
  Administered 2012-02-20 (×2): 2 mg via INTRAVENOUS
  Administered 2012-02-29: 1 mg via INTRAVENOUS
  Filled 2012-02-20: qty 2
  Filled 2012-02-20: qty 1
  Filled 2012-02-20: qty 2

## 2012-02-20 NOTE — Consult Note (Signed)
[facility] ReConsult: 12:56 PM 02/20/2012   Referring Provider: Dr Darrold Span Primary Care Physician:  Dorrene German, MD, MD Primary Gastroenterologist:  Dr. Rhea Belton, inpatient only.  Reason for Consultation:  Bleeding per rectum and anastomotic leak.   HPI: Vanessa [name] is a 36 y.o. female.  Admitted 3.5 weeks ago with bleeding per rectum and pelvic pain.  Prior hx of radiation treatment of cervical cancer.  Dr Rhea Belton attempted colonoscopy on 5/9, unable to reach beyond fixed sigmoid stricture but radiation proctitis noted.  S/P 5/14 rectosigmoid resection with end to end anastomosis and loop ileostomy by Dr [name] and [place]. Slow to tolerate solid foods.  Developed multiple intra-abdominal abcesses from likely anastomotic leak.  Perc drainage with placement of 3 drains on 5/23, purulent drainage from all.  Some bleeding noted from RUQ drain noted on 5/26. Growing E. Coli from drain # 1.  Bacteroides, Strep Viridans from drain # 3. .  Bloody BMs noted on 5/29 along with increased abd pain.  WBCs increased.   CT scan 5/29 worrisome for anastomotic breakdown:  Persistent pneumoperitoneum, new left pararenal abcess, smaller lower quadrant abcesses, ? Fistula formation in subumbilical tissue.  Now seen by Dr Darrold Span for platelet collapse to < 5.  Dr Darrold Span wonders if GI has any further recs.   Transferred yesterday to ICU.  RN also reports vaginal bleeding, pt herself not sensing this but does say she started passing blood from rectum 2 days ago at which time lower abdominal pain increased.  Prior to this had never passed anything significant from rectum since prior to surgery.  Diet wise never able to tolerate much in way of solids and most recently had been on liquid diet.  She is again nauseated but not vomiting.  Has never vomited blood.  She is NPO.  She now has PCA in place to deal with abdominal pain.        Past Medical History    Diagnosis Date  . Hypertension   . Cervical cancer     Past Surgical History  Procedure Date  . Cholecystectomy   . Radiation implants   . Colonoscopy 01/30/2012    Procedure: COLONOSCOPY;  Surgeon: Beverley Fiedler, MD;  Location: [facility] ENDOSCOPY;  Service: Gastroenterology;  Laterality: N/A;  . Colonoscopy 01/30/2012    Procedure: COLONOSCOPY;  Surgeon: Beverley Fiedler, MD;  Location: [facility] OR;  Service: Gastroenterology;  Laterality: N/A;  . Colostomy revision 02/04/2012    Procedure: COLON RESECTION SIGMOID;  Surgeon: Cherylynn Ridges, MD;  Location: [facility] OR;  Service: General;  Laterality: N/A;    Prior to Admission medications   Medication Sig Start Date End Date Taking? Authorizing Provider  hydrochlorothiazide (HYDRODIURIL) 12.5 MG tablet Take 12.5 mg by mouth daily.   Yes Historical Provider, MD    Scheduled Meds:    . acetaminophen  650 mg Oral Once  . ciprofloxacin  400 mg Intravenous Q12H  . diphenhydrAMINE  25 mg Oral Once  . feeding supplement  237 mL Oral BID BM  . hydrochlorothiazide  12.5 mg Oral Daily  . HYDROmorphone PCA 0.3 mg/mL   Intravenous Q4H  . iohexol  20 mL Oral Q1 Hr x 2  . metronidazole  500 mg Intravenous Q8H  . morphine  15 mg Oral QHS  . morphine  30 mg Oral Daily  . mulitivitamin with minerals  1 tablet Oral Daily  . neomycin-bacitracin-polymyxin      . white petrolatum  PRN Meds: acetaminophen, alum & mag hydroxide-simeth, diphenhydrAMINE, diphenhydrAMINE, HYDROmorphone (DILAUDID) injection, iohexol, naloxone, ondansetron (ZOFRAN) IV, ondansetron (ZOFRAN) IV, ondansetron, oxyCODONE-acetaminophen, simethicone, sodium chloride, zolpidem   Allergies as of 01/27/2012 - Review Complete 01/27/2012  Allergen Reaction Noted  . Morphine and related Hives 11/13/2011    History reviewed. No pertinent family history.  History   Social History  . Marital Status: Single    Spouse Name: N/A    Number of Children: N/A  . Years of Education: N/A    Occupational History  . Not on file.   Social History Main Topics  . Smoking status: Current Everyday Smoker -- 0.5 packs/day  . Smokeless tobacco: Not on file  . Alcohol Use: No  . Drug Use: No     pt states she smokes 4-7 blunts(cigars) daily due to pain  . Sexually Active: No     PHYSICAL EXAM: Vital signs in last 24 hours: Temp:  [98.1 F (36.7 C)-99.6 F (37.6 C)] 98.8 F (37.1 C) (05/30 1251) Pulse Rate:  [96-113] 96  (05/30 1000) Resp:  [14-35] 18  (05/30 1100) BP: (102-126)/(56-89) 102/61 mmHg (05/30 1100) SpO2:  [99 %-100 %] 100 % (05/30 0900)  General: slightly diaphoretic.  Smells of purulence/necrosis/heme.  Head:  Slight facial edema  Eyes:  No icterus or pallor Ears:  Not HOH  Nose:  No discharge or bleeding Mouth:  No blood or lesions.  Teeth in good repair Neck:  No mass or JVD Lungs:  Clear B.  Dyspneic.   apparatus in place Heart: RRR Abdomen:  2 drains on left, one in lower quadrant is purulent, the other in mid/upper left is dark/moroonish, one drain on right mid lateral abdomen in purulent.  Ileostomy output is greenish/dark bilious. Also bandaged is the surgical incision.  The abdomen all together smell strongly purulent/necrotic   Rectal: not done.  Saw clot of blood along with small amount of liquid blood she just passed into bed pan  Musc/Skeltl: no joint swellilng Extremities:  No pedal edema.  Feet are warm  Neurologic:  Pleasant,  Skin:  No rash Psych:  Depressed, slightly anxious.   Intake/Output from previous day: 05/29 0701 - 05/30 0700 In: 2887.5 [P.O.:200; I.V.:1000; Blood:987.5; IV Piggyback:700] Out: 2160 [Urine:1475; Drains:135; Stool:550] Intake/Output this shift: Total I/O In: 111.7 [I.V.:111.7] Out: 200 [Urine:200]  LAB RESULTS:  Basename 02/20/12 1140 02/20/12 0330 02/19/12 1712  WBC 13.2* 16.0* 15.3*  HGB 6.7* 8.7* 6.7*  HCT 19.1* 24.8* 19.1*  PLT PENDINGPENDING <5* <5*   BMET Lab Results  Component Value Date    NA 126* 02/20/2012   NA 125* 02/15/2012   NA 125* 02/13/2012   K 3.4* 02/20/2012   K 3.3* 02/15/2012   K 3.1* 02/13/2012   CL 89* 02/20/2012   CL 87* 02/15/2012   CL 87* 02/13/2012   CO2 26 02/20/2012   CO2 28 02/15/2012   CO2 25 02/13/2012   GLUCOSE 111* 02/20/2012   GLUCOSE 101* 02/15/2012   GLUCOSE 97 02/13/2012   BUN 7 02/20/2012   BUN 5* 02/15/2012   BUN 6 02/13/2012   CREATININE 0.46* 02/20/2012   CREATININE 0.34* 02/15/2012   CREATININE 0.31* 02/13/2012   CALCIUM 9.7 02/20/2012   CALCIUM 9.7 02/15/2012   CALCIUM 9.7 02/13/2012   LFT  Basename 02/20/12 0330  PROT 6.5  ALBUMIN 2.2*  AST 16  ALT 19  ALKPHOS 103  BILITOT 0.9  BILIDIR --  IBILI --   PT/INR Lab Results  Component Value  Date   INR 1.21 02/20/2012   INR 1.25 02/20/2012   INR 1.19 02/19/2012   RADIOLOGY STUDIES: Ct Abdomen Pelvis W Contrast 02/19/2012  *RADIOLOGY REPORT*  Clinical Data: Elevated white count, pelvic abscesses  CT ABDOMEN AND PELVIS WITH CONTRAST  Technique:  Multidetector CT imaging of the abdomen and pelvis was performed following the standard protocol during bolus administration of intravenous contrast.  Contrast: OMNIPAQUE IOHEXOL 300 MG/ML  SOLN  Comparison: Feb 13, 2012 and Feb 12, 2012  Findings: Bibasilar atelectasis and small pleural effusions remain present.  There is persistent pneumoperitoneum, scattered throughout the abdomen and pelvis.  There also multiple loculated fluid collections throughout the abdomen, some of which contain gas.  Gas also tracks along the subumbilical soft tissues and has increased. Drainage tubes remain present within both lower quadrants.  The largest fluid collections within both lower quadrants have decreased slightly in size but the one on the right contains more gas now and tracts further along the lateral liver margin.  There is a new dominant abscess in the left anterior pararenal space.  The proximal duodenum and stomach are distended, likely secondary to  mechanical obstruction  or inflammatory ileus.  There is also mild bilateral hydronephrosis, and a fluid collection within the left hemi pelvis is larger now.  The right lower quadrant ileostomy is stable in appearance.  The osseous structures, bladder, pancreas, adrenal glands, liver, spleen are unremarkable.  IMPRESSION: Persistent pneumoperitoneum is again worrisome for anastomotic breakdown.  The largest abscesses within both lower quadrants have decreased in size after drainage tube placement; however, there is a new dominant abscess in the left anterior pararenal space. Additional smaller abscesses also remain present throughout the abdomen.  Tracking of gas in the subumbilical soft tissues has increased and there is adjacent intraperitoneal gas which is worrisome for fistula formation.  Original Report Authenticated By: Brandon Melnick, M.D.    ENDOSCOPIC STUDIES: 01/30/12   Flex Sig/attempted colonoscopy Radiation proctitis, fixed sigmoid.  Procedure aborted due to pt intolerance/inadequate sedation.  Following this had single contrast BE showing: Fixed focal irregular stricture in the sigmoid colon as seen on CT,  likely related to radiation.   IMPRESSION: *  Anastomotic leak and intestinal bleeding in complicated post-surgical patient who has acute TTP. Acute bleeding per rectum in last 36 hours in setting of thrombocytopenia.  *  Hx cervical cancer.  S/p radiation treatment.  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 Developed anastomotic leak, abcesses requiring perc drainage.  Now with new abcess *  Acute thrombocytopenia, likely secondary to meds per Dr Darrold Span.  Not in DIC per Dr Darrold Span.  Platelet transfusion initiated. So far getting 2nd unit of FFP and received one packet of platelets.  *  Acute blood loss anemia.  Hgb down 2 grams in last 8 hours. No PRBCs given yet.   PLAN: *  Per surgery.   *  Transfuse PRBCs, RN calling  surgery for orders. .  *   No role for GI intervention *  Wonder if we should add Protonix prophylaxis in this acutely ill Pt? *  Should ID or CCM consultation be sought? Hopefully bleeding will cease when platelets recover.    LOS: 24 days   Jennye Moccasin  02/20/2012, 12:56 PM Pager: [phone]  Addendum:  1339:  Called by pharmacist who recs the PPI prophylaxis, so I will order IV Protonix.    GI ATTENDING  EXTENSIVE HISTORY, LABS, X-RAYS REVIEWED. AS  WELL, PRIOR ENDOSCOPY REPORT. PATIENT SEEN AND EXAMINED. AGREE WITH H&P AS OUTLINED ABOVE.  HER RECTAL BLEEDING IS ALMOST CERTAINLY DUE TO RECENTLY DOCUMENTED RADIATION PROCTITIS. THIS IS EXACERBATED BY PLATELETS < 5K! OTHER POTENTIAL CAUSES INCLUDE ANASTOMOTIC ULCERATION. TOO EARLY FOR DIVERSION COLITIS. THE TREATMENT HERE IS CORRECTION OF THROMBOCYTOPENIA. NO ROLE FOR ENDOSCOPY.  NOT THAT SIGNIFICANT BLEEDING FROM OTHER SOURCES (E.G. JP DRAINAGE) IS CONTRIBUTING TO ANEMIA.   WE ARE AVAILABLE PRN. CALL ME FOR QUESTIONS  [phone]).THANKS.  Wilhemina Bonito. Eda Keys., M.D. [facility] Division of Gastroenterology

## 2012-02-20 NOTE — Progress Notes (Signed)
Subjective: Noted transfer to ICU for thrombocytopenia and bleeding. CCS note reviewed. Pt feeling ok this am, some better than yesterday. Has received PLTs transfusion and now getting FFP also.  Objective: Physical Exam: BP 105/58  Pulse 96  Temp(Src) 98.4 F (36.9 C) (Oral)  Resp 18  Ht 5\' 4"  (1.626 m)  Wt 156 lb 12 oz (71.1 kg)  BMI 26.91 kg/m2  SpO2 100% Drains intact R and L upper quadrant drains with purulent output. LLQ drain still looks like bloody stool.   Labs: CBC  Basename 02/20/12 0330 02/19/12 1712  WBC 16.0* 15.3*  HGB 8.7* 6.7*  HCT 24.8* 19.1*  PLT <5* <5*   BMET  Basename 02/20/12 0330  NA 126*  K 3.4*  CL 89*  CO2 26  GLUCOSE 111*  BUN 7  CREATININE 0.46*  CALCIUM 9.7   LFT  Basename 02/20/12 0330  PROT 6.5  ALBUMIN 2.2*  AST 16  ALT 19  ALKPHOS 103  BILITOT 0.9  BILIDIR --  IBILI --  LIPASE --   PT/INR  Basename 02/20/12 0330 02/19/12 1712  LABPROT 16.0* 15.4*  INR 1.25 1.19     Studies/Results: Ct Abdomen Pelvis W Contrast  02/19/2012  *RADIOLOGY REPORT*  Clinical Data: Elevated white count, pelvic abscesses  CT ABDOMEN AND PELVIS WITH CONTRAST  Technique:  Multidetector CT imaging of the abdomen and pelvis was performed following the standard protocol during bolus administration of intravenous contrast.  Contrast: OMNIPAQUE IOHEXOL 300 MG/ML  SOLN  Comparison: Feb 13, 2012 and Feb 12, 2012  Findings: Bibasilar atelectasis and small pleural effusions remain present.  There is persistent pneumoperitoneum, scattered throughout the abdomen and pelvis.  There also multiple loculated fluid collections throughout the abdomen, some of which contain gas.  Gas also tracks along the subumbilical soft tissues and has increased. Drainage tubes remain present within both lower quadrants.  The largest fluid collections within both lower quadrants have decreased slightly in size but the one on the right contains more gas now and tracts  further along the lateral liver margin.  There is a new dominant abscess in the left anterior pararenal space.  The proximal duodenum and stomach are distended, likely secondary to mechanical obstruction  or inflammatory ileus.  There is also mild bilateral hydronephrosis, and a fluid collection within the left hemi pelvis is larger now.  The right lower quadrant ileostomy is stable in appearance.  The osseous structures, bladder, pancreas, adrenal glands, liver, spleen are unremarkable.  IMPRESSION: Persistent pneumoperitoneum is again worrisome for anastomotic breakdown.  The largest abscesses within both lower quadrants have decreased in size after drainage tube placement; however, there is a new dominant abscess in the left anterior pararenal space. Additional smaller abscesses also remain present throughout the abdomen.  Tracking of gas in the subumbilical soft tissues has increased and there is adjacent intraperitoneal gas which is worrisome for fistula formation.  Original Report Authenticated By: Brandon Melnick, M.D.    Assessment/Plan: Multiple intra-abdominal abscesses from anastomotic breakdown.  S/p perc drains X 3 5/23  Acute thrombocytopenia and blood anemia Getting PLTs and FFP CT reviewed: Persistent pneumoperitoneum is again worrisome for anastomotic  breakdown. The largest abscesses within both lower quadrants have  decreased in size after drainage tube placement; however, there is  a new dominant abscess in the left anterior pararenal space.  Additional smaller abscesses also remain present throughout the  abdomen. Tracking of gas in the subumbilical soft tissues has  increased and there is  adjacent intraperitoneal gas which is  worrisome for fistula formation. Plan per CCS. OR vs consideration of additional perc drains  Raife Lizer, [id number] 02/20/2012 10:25 AM    LOS: 24 days

## 2012-02-20 NOTE — Progress Notes (Signed)
UR Completed.  Vanessa [name] [phone] 02/20/2012  

## 2012-02-20 NOTE — Progress Notes (Signed)
02/20/2012, 9:57 AM  Hospital day: 25 Antibiotics: day 2 cipro/flagyl  Subjective: Now in surgical ICU. Awake and alert, pain in abdomen now 2 after IV dilaudid recently given. Has had 1 bloody stool since arrival in ICU. Nausea with vomiting x1 last pm. Feels a little short of breath. No other pain and no other noted bleeding.  Confirmed with blood bank that she received 1 phoresis platelets at 1756 on 5-29 and 1 phoresis platelets at 0645 today. I had spoken with RN at 0710 requesting post-transfusion platelet count with that order put in, but some confusion with lab and has not been drawn yet. I believe that she had had 2u PRBCs last pm. Last CBC was 0330, had been due on regular 4x daily schedule ~ 0900 but some confusion with that draw time. HIT and DIC not drawn yet.  Objective: Vital signs in last 24 hours: Blood pressure 109/61, pulse 97, temperature 98.3 F (36.8 C), temperature source Oral, resp. rate 17, height 5\' 4"  (1.626 m), weight 156 lb 12 oz (71.1 kg), SpO2 100.00%. Looks uncomfortable but not in acute distress. Respirations not labored supine RA. Mouth moist. Lungs without wheezes/ rales anterior or lateral. Cor tachy regular. Abdomen still somewhat distended, some bowel sounds heard, formed stool in ostomy. No bleeding at drains or surgical incision. LE no edema, cords, tenderness. Few petechiae on dorsum of feet. Feet warm, moves without assistance. Speech fluent and appropriate.  Intake/Output from previous day: 05/29 0701 - 05/30 0700 In: 2887.5 [P.O.:200; I.V.:1000; Blood:987.5; IV Piggyback:700] Out: 2160 [Urine:1475; Drains:135; Stool:550] Intake/Output this shift: Total I/O In: 111.7 [I.V.:111.7] Out: 200 [Urine:200]  Lab Results:  Basename 02/20/12 0330 02/19/12 1712  WBC 16.0* 15.3*  HGB 8.7* 6.7*  HCT 24.8* 19.1*  PLT <5* <5*   BMET  Basename 02/20/12 0330  NA 126*  K 3.4*  CL 89*  CO2 26  GLUCOSE 111*  BUN 7  CREATININE 0.46*  CALCIUM 9.7    Full CMET has Tprot 6.5, alb 2.2, otherwise normal. Note IVF hanging now D5 with 40 KCL at 100. Agree with Dr Arita Miss note re change to NS and I have put that order in now. Studies/Results: Ct Abdomen Pelvis W Contrast  02/19/2012  *RADIOLOGY REPORT*  Clinical Data: Elevated white count, pelvic abscesses  CT ABDOMEN AND PELVIS WITH CONTRAST  Technique:  Multidetector CT imaging of the abdomen and pelvis was performed following the standard protocol during bolus administration of intravenous contrast.  Contrast: OMNIPAQUE IOHEXOL 300 MG/ML  SOLN  Comparison: Feb 13, 2012 and Feb 12, 2012  Findings: Bibasilar atelectasis and small pleural effusions remain present.  There is persistent pneumoperitoneum, scattered throughout the abdomen and pelvis.  There also multiple loculated fluid collections throughout the abdomen, some of which contain gas.  Gas also tracks along the subumbilical soft tissues and has increased. Drainage tubes remain present within both lower quadrants.  The largest fluid collections within both lower quadrants have decreased slightly in size but the one on the right contains more gas now and tracts further along the lateral liver margin.  There is a new dominant abscess in the left anterior pararenal space.  The proximal duodenum and stomach are distended, likely secondary to mechanical obstruction  or inflammatory ileus.  There is also mild bilateral hydronephrosis, and a fluid collection within the left hemi pelvis is larger now.  The right lower quadrant ileostomy is stable in appearance.  The osseous structures, bladder, pancreas, adrenal glands, liver, spleen are unremarkable.  IMPRESSION: Persistent pneumoperitoneum is again worrisome for anastomotic breakdown.  The largest abscesses within both lower quadrants have decreased in size after drainage tube placement; however, there is a new dominant abscess in the left anterior pararenal space. Additional smaller abscesses also  remain present throughout the abdomen.  Tracking of gas in the subumbilical soft tissues has increased and there is adjacent intraperitoneal gas which is worrisome for fistula formation.  Original Report Authenticated By: Brandon Melnick, M.D.   Pharmacist med review note seen, multiple meds with <1% incidence thrombocytopenia.   Spoke with pharmacist now, last SQ heparin 5-14 and lovenox 5-15 thru  Assessment/Plan: 1.Acute thrombocytopenia: etiology not clear, but appropriate to transfuse platelets due to active bleeding. I would like to see post-transfusion platelet count to see if we are getting any bump. Need to check for HIT, antibiotics changed last pm, not clinically in DIC but needs those labs. 2.GI bleeding, concern for anastomotic breakdown, radiation proctitis at initial attempts at colonoscopy. [facility] was involved earlier this admission and I have let them know situation now. 3.bowel injury felt related to previous RT for cervical cancer, post resection of sigmoid colon with ileostomy. Multiple intraabdominal abscesses with drains in. 3.Anemia related to GI bleeding in last 24 hrs, hgb better on last CBC after transfusion 2u PRBCs, tho apparently one additional episode of bleeding since then. 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 She saw gyn oncology last April 2013. No known active disease. 5.poor nutritional status related to above. 6.hyponatremia: IVF adjusted   Chrystine Frogge P [phone]

## 2012-02-20 NOTE — Progress Notes (Signed)
Call to lab to request Blood for labs be drawn now per order of Dr Darrold Span.  Spoke to [place] who states she will notify appropriate lab tech.

## 2012-02-20 NOTE — Progress Notes (Signed)
Patient ID: Vanessa [name], female   DOB: November 10, 1975, 36 y.o.   MRN: [id number] 16 Days Post-Op  Subjective: Pt transferred to ICU yesterday due to severe thrombocytopenia and bloody bowel movements.  Repeat CT demonstrates additional fluid collections, but pt remains thrombocytopenic.  We have transfused several units of platelets.  She is off lovenox now, we have taken her off zosyn as well.  She is now experiencing emesis.    Objective: Vital signs in last 24 hours: Temp:  [98.1 F (36.7 C)-99.6 F (37.6 C)] 98.3 F (36.8 C) (05/30 0728) Pulse Rate:  [98-113] 102  (05/30 0700) Resp:  [15-35] 21  (05/30 0700) BP: (107-126)/(68-89) 119/73 mmHg (05/30 0700) SpO2:  [99 %-100 %] 100 % (05/30 0700) Last BM Date: 02/19/12  Intake/Output from previous day: 05/29 0701 - 05/30 0700 In: 2887.5 [P.O.:200; I.V.:1000; Blood:987.5; IV Piggyback:700] Out: 2160 [Urine:1475; Drains:135; Stool:550] Intake/Output this shift:    PE: Abd: soft, slightly distended, mild diffuse tenderness, wound is clean, ostomy in place, 2 JP bulbs with milky cloudy output.  LLQ bag drain has frank dark brown stool with likely some old blood present as well.  Lab Results:   Basename 02/20/12 0330 02/19/12 1712  WBC 16.0* 15.3*  HGB 8.7* 6.7*  HCT 24.8* 19.1*  PLT <5* <5*   BMET  Basename 02/20/12 0330  NA 126*  K 3.4*  CL 89*  CO2 26  GLUCOSE 111*  BUN 7  CREATININE 0.46*  CALCIUM 9.7   PT/INR  Basename 02/20/12 0330 02/19/12 1712  LABPROT 16.0* 15.4*  INR 1.25 1.19   CMP     Component Value Date/Time   NA 126* 02/20/2012 0330   K 3.4* 02/20/2012 0330   CL 89* 02/20/2012 0330   CO2 26 02/20/2012 0330   GLUCOSE 111* 02/20/2012 0330   BUN 7 02/20/2012 0330   CREATININE 0.46* 02/20/2012 0330   CALCIUM 9.7 02/20/2012 0330   CALCIUM 10.7* 02/01/2012 0705   PROT 6.5 02/20/2012 0330   ALBUMIN 2.2* 02/20/2012 0330   AST 16 02/20/2012 0330   ALT 19 02/20/2012 0330   ALKPHOS 103 02/20/2012 0330   BILITOT  0.9 02/20/2012 0330   GFRNONAA >90 02/20/2012 0330   GFRAA >90 02/20/2012 0330   Lipase     Component Value Date/Time   LIPASE 16 11/13/2011 1007       Studies/Results: Ct Abdomen Pelvis W Contrast  02/19/2012  *RADIOLOGY REPORT*  Clinical Data: Elevated white count, pelvic abscesses  CT ABDOMEN AND PELVIS WITH CONTRAST  Technique:  Multidetector CT imaging of the abdomen and pelvis was performed following the standard protocol during bolus administration of intravenous contrast.  Contrast: OMNIPAQUE IOHEXOL 300 MG/ML  SOLN  Comparison: Feb 13, 2012 and Feb 12, 2012  Findings: Bibasilar atelectasis and small pleural effusions remain present.  There is persistent pneumoperitoneum, scattered throughout the abdomen and pelvis.  There also multiple loculated fluid collections throughout the abdomen, some of which contain gas.  Gas also tracks along the subumbilical soft tissues and has increased. Drainage tubes remain present within both lower quadrants.  The largest fluid collections within both lower quadrants have decreased slightly in size but the one on the right contains more gas now and tracts further along the lateral liver margin.  There is a new dominant abscess in the left anterior pararenal space.  The proximal duodenum and stomach are distended, likely secondary to mechanical obstruction  or inflammatory ileus.  There is also mild bilateral hydronephrosis, and  a fluid collection within the left hemi pelvis is larger now.  The right lower quadrant ileostomy is stable in appearance.  The osseous structures, bladder, pancreas, adrenal glands, liver, spleen are unremarkable.  IMPRESSION: Persistent pneumoperitoneum is again worrisome for anastomotic breakdown.  The largest abscesses within both lower quadrants have decreased in size after drainage tube placement; however, there is a new dominant abscess in the left anterior pararenal space. Additional smaller abscesses also remain present  throughout the abdomen.  Tracking of gas in the subumbilical soft tissues has increased and there is adjacent intraperitoneal gas which is worrisome for fistula formation.  Original Report Authenticated By: Brandon Melnick, M.D.    Anti-infectives: Anti-infectives     Start     Dose/Rate Route Frequency Ordered Stop   02/19/12 1800   ciprofloxacin (CIPRO) IVPB 400 mg        400 mg 200 mL/hr over 60 Minutes Intravenous Every 12 hours 02/19/12 1612     02/19/12 1700   metroNIDAZOLE (FLAGYL) IVPB 500 mg        500 mg 100 mL/hr over 60 Minutes Intravenous 3 times per day 02/19/12 1612     02/12/12 0800   piperacillin-tazobactam (ZOSYN) IVPB 3.375 g  Status:  Discontinued        3.375 g 12.5 mL/hr over 240 Minutes Intravenous 3 times per day 02/12/12 0800 02/19/12 1612   02/10/12 1000   amoxicillin-clavulanate (AUGMENTIN) 875-125 MG per tablet 1 tablet  Status:  Discontinued        1 tablet Oral Every 12 hours 02/10/12 0823 02/12/12 0800   02/04/12 1800   cefOXitin (MEFOXIN) 1 g in dextrose 5 % 50 mL IVPB        1 g 100 mL/hr over 30 Minutes Intravenous Every 6 hours 02/04/12 1621 02/05/12 0617   02/04/12 0000   cefOXitin (MEFOXIN) 2 g in dextrose 5 % 50 mL IVPB        2 g 100 mL/hr over 30 Minutes Intravenous 60 min pre-op 02/03/12 0333 02/04/12 1145   02/03/12 0500   cefOXitin (MEFOXIN) 2 g in dextrose 5 % 50 mL IVPB  Status:  Discontinued        2 g 100 mL/hr over 30 Minutes Intravenous 60 min pre-op 02/02/12 1156 02/03/12 0333   02/03/12 0000   cefOXitin (MEFOXIN) 2 g in dextrose 5 % 50 mL IVPB  Status:  Discontinued        2 g 100 mL/hr over 30 Minutes Intravenous 60 min pre-op 02/02/12 1004 02/02/12 1156   01/27/12 2200   cefTRIAXone (ROCEPHIN) 1 g in dextrose 5 % 50 mL IVPB        1 g 100 mL/hr over 30 Minutes Intravenous Every 24 hours 01/27/12 2152 01/29/12 2359   01/27/12 1915   cefTRIAXone (ROCEPHIN) 1 g in dextrose 5 % 50 mL IVPB        1 g 100 mL/hr over 30 Minutes  Intravenous  Once 01/27/12 1914 01/27/12 1957           Assessment/Plan  1. S/p ex lap with sig colectomy and diverting ileostomy.  Now with likely anastomotic breakdown 2. Leukocytosis 3. Thrombocytopenia 4. ABL anemia 5. Rectal bleeding  Plan: Hope to perc drain after some improvement in platelet count.  Pt at worse time for takeback, and cannot operate at this level of platelet count either.   Mechanical DVT prophylaxis only.  Bed rest. Hyponatremia:  Change to NS Serial labs Transfuse FFP  Check DIC panel    LOS: 24 days    Marianela Mandrell 02/20/2012

## 2012-02-21 DIAGNOSIS — R1084 Generalized abdominal pain: Secondary | ICD-10-CM

## 2012-02-21 LAB — PREPARE PLATELET PHERESIS: Unit division: 0

## 2012-02-21 LAB — CBC
HCT: 19.7 % — ABNORMAL LOW (ref 36.0–46.0)
Hemoglobin: 6.4 g/dL — CL (ref 12.0–15.0)
MCH: 29 pg (ref 26.0–34.0)
MCHC: 34.1 g/dL (ref 30.0–36.0)
MCHC: 34.4 g/dL (ref 30.0–36.0)
MCHC: 34.5 g/dL (ref 30.0–36.0)
Platelets: 45 10*3/uL — ABNORMAL LOW (ref 150–400)
Platelets: <5 10*3/uL — CL (ref 150–400)
Platelets: <5 10*3/uL — CL (ref 150–400)
RBC: 2.19 MIL/uL — ABNORMAL LOW (ref 3.87–5.11)
RBC: 2.59 MIL/uL — ABNORMAL LOW (ref 3.87–5.11)
RDW: 15 % (ref 11.5–15.5)
RDW: 16 % — ABNORMAL HIGH (ref 11.5–15.5)
RDW: 16.5 % — ABNORMAL HIGH (ref 11.5–15.5)
WBC: 15.8 10*3/uL — ABNORMAL HIGH (ref 4.0–10.5)
WBC: 16.1 10*3/uL — ABNORMAL HIGH (ref 4.0–10.5)
WBC: 19 10*3/uL — ABNORMAL HIGH (ref 4.0–10.5)

## 2012-02-21 LAB — PREPARE FRESH FROZEN PLASMA: Unit division: 0

## 2012-02-21 LAB — BASIC METABOLIC PANEL
CO2: 25 mEq/L (ref 19–32)
Calcium: 9.5 mg/dL (ref 8.4–10.5)
Chloride: 98 mEq/L (ref 96–112)
Creatinine, Ser: 0.82 mg/dL (ref 0.50–1.10)
GFR calc Af Amer: >90 mL/min (ref >90)
Sodium: 134 mEq/L — ABNORMAL LOW (ref 135–145)

## 2012-02-21 LAB — PREPARE RBC (CROSSMATCH)

## 2012-02-21 LAB — PROTIME-INR
INR: 1.26 (ref 0.00–1.49)
Prothrombin Time: 16.1 seconds — ABNORMAL HIGH (ref 11.6–15.2)

## 2012-02-21 MED ORDER — BARRIER CREAM NON-SPECIFIED
1.0000 "application " | TOPICAL_CREAM | Freq: Three times a day (TID) | TOPICAL | Status: DC
  Administered 2012-02-21 – 2012-03-12 (×46): 1 via TOPICAL
  Filled 2012-02-21: qty 1

## 2012-02-21 MED ORDER — TRACE MINERALS CR-CU-MN-SE-ZN 10-1000-500-60 MCG/ML IV SOLN
INTRAVENOUS | Status: AC
  Administered 2012-02-21: 21:00:00 via INTRAVENOUS
  Filled 2012-02-21: qty 1000

## 2012-02-21 MED ORDER — SODIUM CHLORIDE 0.9 % IV SOLN
INTRAVENOUS | Status: DC
  Administered 2012-02-21 – 2012-02-22 (×3): via INTRAVENOUS
  Filled 2012-02-21 (×5): qty 1000

## 2012-02-21 MED ORDER — FAT EMULSION 20 % IV EMUL
250.0000 mL | INTRAVENOUS | Status: AC
  Administered 2012-02-21: 250 mL via INTRAVENOUS
  Filled 2012-02-21: qty 250

## 2012-02-21 MED ORDER — INSULIN ASPART 100 UNIT/ML ~~LOC~~ SOLN
0.0000 [IU] | SUBCUTANEOUS | Status: DC
  Administered 2012-02-22 (×7): 1 [IU] via SUBCUTANEOUS
  Administered 2012-02-23 (×2): 2 [IU] via SUBCUTANEOUS
  Administered 2012-02-23: 1 [IU] via SUBCUTANEOUS
  Administered 2012-02-23 (×2): 2 [IU] via SUBCUTANEOUS
  Administered 2012-02-24 – 2012-02-26 (×15): 1 [IU] via SUBCUTANEOUS
  Administered 2012-02-26: 2 [IU] via SUBCUTANEOUS
  Administered 2012-02-26 – 2012-03-01 (×13): 1 [IU] via SUBCUTANEOUS

## 2012-02-21 MED ORDER — SODIUM CHLORIDE 0.9 % IV SOLN
INTRAVENOUS | Status: DC
  Administered 2012-02-21: 08:00:00 via INTRAVENOUS

## 2012-02-21 MED ORDER — SODIUM CHLORIDE 0.9 % IJ SOLN
INTRAMUSCULAR | Status: AC
  Filled 2012-02-21: qty 10

## 2012-02-21 MED ORDER — ACETAMINOPHEN 325 MG PO TABS
650.0000 mg | ORAL_TABLET | Freq: Once | ORAL | Status: AC
  Administered 2012-02-21: 650 mg via ORAL
  Filled 2012-02-21: qty 2

## 2012-02-21 MED ORDER — GATORADE (BH)
240.0000 mL | Freq: Once | ORAL | Status: AC
  Administered 2012-02-21: 240 mL via ORAL
  Filled 2012-02-21 (×2): qty 480

## 2012-02-21 MED ORDER — SODIUM CHLORIDE 0.9 % IJ SOLN
10.0000 mL | Freq: Two times a day (BID) | INTRAMUSCULAR | Status: DC
  Administered 2012-02-21: 10 mL
  Administered 2012-02-22 (×2): 20 mL
  Administered 2012-02-23: 10 mL
  Administered 2012-02-23: 20 mL
  Administered 2012-02-24 (×2): 10 mL
  Administered 2012-02-25 – 2012-02-26 (×2): 30 mL
  Administered 2012-02-26 – 2012-02-27 (×2): 10 mL
  Administered 2012-02-27: 20 mL
  Administered 2012-02-28 – 2012-03-01 (×4): 10 mL
  Administered 2012-03-01: 30 mL
  Administered 2012-03-03 – 2012-03-05 (×3): 10 mL
  Administered 2012-03-06: 20 mL
  Administered 2012-03-08 (×2): 10 mL
  Administered 2012-03-09: 40 mL
  Filled 2012-02-21: qty 30
  Filled 2012-02-21: qty 20
  Filled 2012-02-21: qty 10
  Filled 2012-02-21: qty 40

## 2012-02-21 MED ORDER — ACETAMINOPHEN 325 MG PO TABS
650.0000 mg | ORAL_TABLET | Freq: Once | ORAL | Status: AC
  Administered 2012-02-21: 650 mg via ORAL

## 2012-02-21 MED ORDER — SODIUM CHLORIDE 0.9 % IJ SOLN
10.0000 mL | INTRAMUSCULAR | Status: DC | PRN
  Administered 2012-02-22: 10 mL
  Administered 2012-02-23: 30 mL
  Administered 2012-02-24 – 2012-02-25 (×2): 10 mL
  Administered 2012-03-01: 9 mL
  Administered 2012-03-04 – 2012-03-05 (×2): 10 mL
  Administered 2012-03-06: 30 mL
  Administered 2012-03-07 – 2012-03-12 (×6): 10 mL
  Filled 2012-02-21 (×4): qty 20
  Filled 2012-02-21: qty 40
  Filled 2012-02-21: qty 10

## 2012-02-21 MED ORDER — DIPHENHYDRAMINE HCL 50 MG/ML IJ SOLN
25.0000 mg | Freq: Once | INTRAMUSCULAR | Status: AC
  Administered 2012-02-21: 50 mg via INTRAVENOUS
  Filled 2012-02-21: qty 1

## 2012-02-21 NOTE — Progress Notes (Signed)
PARENTERAL NUTRITION CONSULT NOTE - INITIAL  Pharmacy Consult for TPN  Indication:   Allergies  Allergen Reactions  . Labetalol Hcl Itching and Other (See Comments)    Bumps to bilateral arms.  . Morphine And Related Hives    Patient Measurements: Height: 5\' 4"  (162.6 cm) Weight: 156 lb 12 oz (71.1 kg) IBW/kg (Calculated) : 54.7   Vital Signs: Temp: 98.2 F (36.8 C) (05/31 1205) Temp src: Oral (05/31 1205) BP: 126/69 mmHg (05/31 1205) Pulse Rate: 106  (05/31 1205) Intake/Output from previous day: 05/30 0701 - 05/31 0700 In: 3961.8 [I.V.:2159.8; ZOXWR:6045; IV Piggyback:350] Out: 701 [Urine:500; Drains:51; Stool:150] Intake/Output from this shift: Total I/O In: 489 [I.V.:282.5; Blood:206.5] Out: 300 [Urine:300]  Labs:  [facility] 02/21/12 0853 02/20/12 2356 02/20/12 1140 02/20/12 0330 02/19/12 1712  WBC 15.8* 19.0* 13.2* -- --  HGB 6.4* 6.8* 6.7* -- --  HCT 18.6* 19.7* 19.1* -- --  PLT <5* <5* <5*<5* -- --  APTT -- -- 36 -- 40*  INR -- 1.26 1.21 1.25 --     Basename 02/21/12 0853 02/20/12 0330  NA 134* 126*  K 3.6 3.4*  CL 98 89*  CO2 25 26  GLUCOSE 95 111*  BUN 16 7  CREATININE 0.82 0.46*  LABCREA -- --  CREAT24HRUR -- --  CALCIUM 9.5 9.7  MG -- --  PHOS -- --  PROT -- 6.5  ALBUMIN -- 2.2*  AST -- 16  ALT -- 19  ALKPHOS -- 103  BILITOT -- 0.9  BILIDIR -- --  IBILI -- --  PREALBUMIN -- --  TRIG -- --  CHOLHDL -- --  CHOL -- --   Estimated Creatinine Clearance: 91.8 ml/min (by C-G formula based on Cr of 0.82).   No results found for this basename: GLUCAP:3 in the last 72 hours  Medical History: Past Medical History  Diagnosis Date  . Hypertension   . Cervical cancer    Medications:  Scheduled:    . acetaminophen  650 mg Oral Once  . barrier cream  1 application Topical TID  . cefTRIAXone (ROCEPHIN)  IV  1 g Intravenous Q24H  . feeding supplement  237 mL Oral BID BM  . hydrochlorothiazide  12.5 mg Oral Daily  . HYDROmorphone PCA 0.3  mg/mL   Intravenous Q4H  . metronidazole  500 mg Intravenous Q8H  . morphine  15 mg Oral QHS  . morphine  30 mg Oral Daily  . mulitivitamin with minerals  1 tablet Oral Daily  . pantoprazole (PROTONIX) IV  40 mg Intravenous QHS  . white petrolatum      . DISCONTD: ciprofloxacin  400 mg Intravenous Q12H   Infusions:    . sodium chloride 150 mL/hr at 02/21/12 0827  . DISCONTD: 0.9 % sodium chloride with kcl Stopped (02/21/12 0600)   PRN: acetaminophen, alum & mag hydroxide-simeth, diphenhydrAMINE, diphenhydrAMINE, HYDROmorphone (DILAUDID) injection, naloxone, ondansetron (ZOFRAN) IV, ondansetron (ZOFRAN) IV, ondansetron, oxyCODONE-acetaminophen, simethicone, sodium chloride, zolpidem  Insulin Requirements in the past 24 hours:  No SSI order  Current Nutrition:  None  Nutritional Goals:  Per RD assessment on 5/29: 1800-2000 kCal, 75-85 grams of protein per day  Assessment: 77 yof with a history of cervical cancer who is s/p sigmoid colectomy and diverting ileostomy on 5/14. Developed multiple intra-abdominal abscesses 2/2 anastomotic leak shown on CT and had 3 perc drains placed. On 5/29 patient had multiple blood BM, hemoptysis, and abdominal pain. Plts <5 (240k on 5/25). CT 5/30 showed additional abscesses and concern  for fistula formation.   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 Anastomotic breakdown leading to many intra-abdominal abscesses and concern for fistula on recent CT 5/30. Perc drains in place with 7ml/24hr drainage. Pt may need more drains placed but unable to take to surgery with low plt count. Pt with significant amount of hemoptysis/hematochezia. Unknown GI absorption. IV PPI. Endo: No hx DM. No CBG checks/SSI ordered. BMET glucose at 95.  Lytes: hyponatremia (134), other lytes wnl. NS infusion at 135ml/hr.  Renal: SCr 0.46 (5/29) --> 0.82. UOP low at 0.74ml/kg/24h Pulm: 1L [place] Cards: BP stable on Hctz only. Tachycardic into 120s Hepatobil: Alk  phos/LFTs/bili wnl from labs on 5/20. Albumin low at 1.8 Neuro: standing oxycontin and dilaudid PCA Heme/Onc: Hx of cervical cancer, plts 5/25 at 240 and now 45 after multiple plt transfusions. Hgb 5.8. 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  ID: wbc 18, afebrile on empiric flagyl, ceftriaxone  Rocephin 5/5 >> 5/8 Cefoxitin 5/14 >> 5/15 Augmentin 5/20 >> 5/21 Zosyn 5/22 >> 5/29 Cipro 5/29 >>5/30 Rocephin 5/30 >> Flagyl 5/29 >>  Micro: 5/23 RUQ Abscess: rare viridans strep; B. Fragilis (no sens) 5/23 LLQ Abscess: few E.coli (pan sens) 5/23 LUQ Abscess: multiple bacteria 5/14 MRSA PCR neg 5/6 Urine: neg  Best Practices: SCDs, IV PPI  Plan:  - Begin Clinimix E5/20 at 58ml/hr to advance to goal rate of 45ml/hr which will provide and average of 1684 kcal and 84g protein per day based on RD assessment (94% total kcal and 100% protein needs) - Begin icu hyperglycemia protocol with q4h cbg/ssi - trace elements/multivitamin/lipids MWF due to Sport and exercise psychologist - order TPN labs  Juliann Pulse 02/21/2012,12:21 PM

## 2012-02-21 NOTE — Progress Notes (Signed)
Spoke with RN re CBC drawn 1225, after 2 phoresis packs platelets and before any PRBCs, with platelets up to 45k with hgb 5.8. Patient passed all of clot from rectum, still oozing blood but has not needed pad changed in last 1.5 hr. First unit PRBCs in process, second unit ordered. Surgery has already given orders for another phoresis of platelets. She will have CBC again no later than 1800, sooner if blood products finished prior. Patient much more comfortable with ice chips po now. I have also spoken to Dr Colonel Bald in pathology earlier this AM, as concern re amount of blood products and supply. Blood bank is aware that she likely will need ongoing support thru weekend. I believe platelets and PRBCs are primary support now, moreso than FFP.

## 2012-02-21 NOTE — Progress Notes (Signed)
Peripherally Inserted Central Catheter/Midline Placement  The IV Nurse has discussed with the patient and/or persons authorized to consent for the patient, the purpose of this procedure and the potential benefits and risks involved with this procedure.  The benefits include less needle sticks, lab draws from the catheter and patient may be discharged home with the catheter.  Risks include, but not limited to, infection, bleeding, blood clot (thrombus formation), and puncture of an artery; nerve damage and irregular heat beat.  Alternatives to this procedure were also discussed.  PICC/Midline Placement Documentation  PICC Triple Lumen 02/21/12 PICC Right Basilic (Active)       Eberardo Demello, Lajean Manes 02/21/2012, 6:39 PM

## 2012-02-21 NOTE — Progress Notes (Signed)
Spoke with RN by phone,  who tells me that patient seems some better. Still small bleeding from rectum. # 3 platelet phoresis about to hang, then will repeat cbc after that. PICC is to be placed to start TNA and I have suggested placing PICC during or just after these platelets are given. I have added K+ back to maintenance IVF now, may need to adjust rate when TNA started.  I would continue to transfuse platelets tonight if bleeding continues. My partners will follow this weekend. Jama Flavors, MD

## 2012-02-21 NOTE — Progress Notes (Signed)
Patient ID: Vanessa [name], female   DOB: May 10, 1976, 36 y.o.   MRN: [id number] 17 Days Post-Op  Subjective: Remains thrombocytopenic despite transfusion.  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  Objective: Vital signs in last 24 hours: Temp:  [97.9 F (36.6 C)-99.3 F (37.4 C)] 98.2 F (36.8 C) (05/31 1205) Pulse Rate:  [96-122] 106  (05/31 1205) Resp:  [12-32] 20  (05/31 1205) BP: (97-140)/(62-89) 126/69 mmHg (05/31 1205) SpO2:  [92 %-100 %] 99 % (05/31 0800) Last BM Date: 02/20/12  Intake/Output from previous day: 05/30 0701 - 05/31 0700 In: 3961.8 [I.V.:2159.8; BJYNW:2956; IV Piggyback:350] Out: 701 [Urine:500; Drains:51; Stool:150] Intake/Output this shift: Total I/O In: 489 [I.V.:282.5; Blood:206.5] Out: 300 [Urine:300]  PE: Abd: Soft, distended, diffuse mild tenderness.  Drains bloody/purulent/ non bilious HEENT:  Dried blood on lips and gums.    Lab Results:   Basename 02/21/12 0853 02/20/12 2356  WBC 15.8* 19.0*  HGB 6.4* 6.8*  HCT 18.6* 19.7*  PLT <5* <5*   BMET  Basename 02/21/12 0853 02/20/12 0330  NA 134* 126*  K 3.6 3.4*  CL 98 89*  CO2 25 26  GLUCOSE 95 111*  BUN 16 7  CREATININE 0.82 0.46*  CALCIUM 9.5 9.7   PT/INR  Basename 02/20/12 2356 02/20/12 1140  LABPROT 16.1* 15.6*  INR 1.26 1.21   CMP     Component Value Date/Time   NA 134* 02/21/2012 0853   K 3.6 02/21/2012 0853   CL 98 02/21/2012 0853   CO2 25 02/21/2012 0853   GLUCOSE 95 02/21/2012 0853   BUN 16 02/21/2012 0853   CREATININE 0.82 02/21/2012 0853   CALCIUM 9.5 02/21/2012 0853   CALCIUM 10.7* 02/01/2012 0705   PROT 6.5 02/20/2012 0330   ALBUMIN 2.2* 02/20/2012 0330   AST 16 02/20/2012 0330   ALT 19 02/20/2012 0330   ALKPHOS 103 02/20/2012 0330   BILITOT 0.9 02/20/2012 0330   GFRNONAA >90 02/21/2012 0853   GFRAA >90 02/21/2012 0853   Lipase     Component Value Date/Time   LIPASE 16 11/13/2011 1007       Studies/Results: Ct Abdomen Pelvis W Contrast  02/19/2012   *RADIOLOGY REPORT*  Clinical Data: Elevated white count, pelvic abscesses  CT ABDOMEN AND PELVIS WITH CONTRAST  Technique:  Multidetector CT imaging of the abdomen and pelvis was performed following the standard protocol during bolus administration of intravenous contrast.  Contrast: OMNIPAQUE IOHEXOL 300 MG/ML  SOLN  Comparison: Feb 13, 2012 and Feb 12, 2012  Findings: Bibasilar atelectasis and small pleural effusions remain present.  There is persistent pneumoperitoneum, scattered throughout the abdomen and pelvis.  There also multiple loculated fluid collections throughout the abdomen, some of which contain gas.  Gas also tracks along the subumbilical soft tissues and has increased. Drainage tubes remain present within both lower quadrants.  The largest fluid collections within both lower quadrants have decreased slightly in size but the one on the right contains more gas now and tracts further along the lateral liver margin.  There is a new dominant abscess in the left anterior pararenal space.  The proximal duodenum and stomach are distended, likely secondary to mechanical obstruction  or inflammatory ileus.  There is also mild bilateral hydronephrosis, and a fluid collection within the left hemi pelvis is larger now.  The right lower quadrant ileostomy is stable in appearance.  The osseous structures, bladder, pancreas, adrenal glands, liver, spleen are unremarkable.  IMPRESSION: Persistent pneumoperitoneum is again worrisome for anastomotic  breakdown.  The largest abscesses within both lower quadrants have decreased in size after drainage tube placement; however, there is a new dominant abscess in the left anterior pararenal space. Additional smaller abscesses also remain present throughout the abdomen.  Tracking of gas in the subumbilical soft tissues has increased and there is adjacent intraperitoneal gas which is worrisome for fistula formation.  Original Report Authenticated By: Brandon Melnick, M.D.     Anti-infectives: Anti-infectives     Start     Dose/Rate Route Frequency Ordered Stop   02/20/12 1415   cefTRIAXone (ROCEPHIN) 1 g in dextrose 5 % 50 mL IVPB        1 g 100 mL/hr over 30 Minutes Intravenous Every 24 hours 02/20/12 1404     02/19/12 1800   ciprofloxacin (CIPRO) IVPB 400 mg  Status:  Discontinued        400 mg 200 mL/hr over 60 Minutes Intravenous Every 12 hours 02/19/12 1612 02/20/12 1404   02/19/12 1700   metroNIDAZOLE (FLAGYL) IVPB 500 mg        500 mg 100 mL/hr over 60 Minutes Intravenous 3 times per day 02/19/12 1612     02/12/12 0800   piperacillin-tazobactam (ZOSYN) IVPB 3.375 g  Status:  Discontinued        3.375 g 12.5 mL/hr over 240 Minutes Intravenous 3 times per day 02/12/12 0800 02/19/12 1612   02/10/12 1000   amoxicillin-clavulanate (AUGMENTIN) 875-125 MG per tablet 1 tablet  Status:  Discontinued        1 tablet Oral Every 12 hours 02/10/12 0823 02/12/12 0800   02/04/12 1800   cefOXitin (MEFOXIN) 1 g in dextrose 5 % 50 mL IVPB        1 g 100 mL/hr over 30 Minutes Intravenous Every 6 hours 02/04/12 1621 02/05/12 0617   02/04/12 0000   cefOXitin (MEFOXIN) 2 g in dextrose 5 % 50 mL IVPB        2 g 100 mL/hr over 30 Minutes Intravenous 60 min pre-op 02/03/12 0333 02/04/12 1145   02/03/12 0500   cefOXitin (MEFOXIN) 2 g in dextrose 5 % 50 mL IVPB  Status:  Discontinued        2 g 100 mL/hr over 30 Minutes Intravenous 60 min pre-op 02/02/12 1156 02/03/12 0333   02/03/12 0000   cefOXitin (MEFOXIN) 2 g in dextrose 5 % 50 mL IVPB  Status:  Discontinued        2 g 100 mL/hr over 30 Minutes Intravenous 60 min pre-op 02/02/12 1004 02/02/12 1156   01/27/12 2200   cefTRIAXone (ROCEPHIN) 1 g in dextrose 5 % 50 mL IVPB        1 g 100 mL/hr over 30 Minutes Intravenous Every 24 hours 01/27/12 2152 01/29/12 2359   01/27/12 1915   cefTRIAXone (ROCEPHIN) 1 g in dextrose 5 % 50 mL IVPB        1 g 100 mL/hr over 30 Minutes Intravenous  Once 01/27/12 1914  01/27/12 1957           Assessment/Plan  1. S/p ex lap with sig colectomy and diverting ileostomy.  Now with likely anastomotic breakdown 2. Leukocytosis 3. Thrombocytopenia 4. ABL anemia 5. Rectal bleeding  Plan: Hope to perc drain after some improvement in platelet count.  Pt at worse time for takeback, and cannot operate at this level of platelet count either.   Mechanical DVT prophylaxis only.  Bed rest. Hyponatremia:  improved Serial labs continue Transfuse FFP,  pRBCs and platelets.   Reheck DIC panel    LOS: 25 days    Esterlene Atiyeh 02/21/2012

## 2012-02-21 NOTE — Progress Notes (Signed)
Nat Math, MD  Notified her of chem panel.  No new orders received.  Updated MD of patient status.

## 2012-02-21 NOTE — Progress Notes (Signed)
17 Days Post-Op  Subjective: Pt still with rectal bleeding, profound thrombocytopenia; has some mild pelvic discomfort; denies CP, hemoptysis; old blood on lips  Objective: Vital signs in last 24 hours: Temp:  [97.9 F (36.6 C)-99.3 F (37.4 C)] 97.9 F (36.6 C) (05/31 1030) Pulse Rate:  [102-122] 102  (05/31 1030) Resp:  [12-30] 20  (05/31 1030) BP: (97-140)/(61-89) 115/76 mmHg (05/31 1030) SpO2:  [92 %-100 %] 99 % (05/31 0800) Last BM Date: 02/20/12  Intake/Output from previous day: 05/30 0701 - 05/31 0700 In: 3961.8 [I.V.:2159.8; ZOXWR:6045; IV Piggyback:350] Out: 701 [Urine:500; Drains:51; Stool:150] Intake/Output this shift: Total I/O In: 245 [I.V.:232.5; Blood:12.5] Out: 300 [Urine:300]  Abd/pelvic drains intact, outputs 20-70 cc's daily; foul smelling /feculent odor noted when drains flushed, blood-tinged/beige colored fluid in bulbs  Lab Results:   Basename 02/21/12 0853 02/20/12 2356  WBC 15.8* 19.0*  HGB 6.4* 6.8*  HCT 18.6* 19.7*  PLT <5* <5*   BMET  Basename 02/21/12 0853 02/20/12 0330  NA 134* 126*  K 3.6 3.4*  CL 98 89*  CO2 25 26  GLUCOSE 95 111*  BUN 16 7  CREATININE 0.82 0.46*  CALCIUM 9.5 9.7   PT/INR  Basename 02/20/12 2356 02/20/12 1140  LABPROT 16.1* 15.6*  INR 1.26 1.21   ABG No results found for this basename: PHART:2,PCO2:2,PO2:2,HCO3:2 in the last 72 hours  Studies/Results: Ct Abdomen Pelvis W Contrast  02/19/2012  *RADIOLOGY REPORT*  Clinical Data: Elevated white count, pelvic abscesses  CT ABDOMEN AND PELVIS WITH CONTRAST  Technique:  Multidetector CT imaging of the abdomen and pelvis was performed following the standard protocol during bolus administration of intravenous contrast.  Contrast: OMNIPAQUE IOHEXOL 300 MG/ML  SOLN  Comparison: Feb 13, 2012 and Feb 12, 2012  Findings: Bibasilar atelectasis and small pleural effusions remain present.  There is persistent pneumoperitoneum, scattered throughout the abdomen and pelvis.   There also multiple loculated fluid collections throughout the abdomen, some of which contain gas.  Gas also tracks along the subumbilical soft tissues and has increased. Drainage tubes remain present within both lower quadrants.  The largest fluid collections within both lower quadrants have decreased slightly in size but the one on the right contains more gas now and tracts further along the lateral liver margin.  There is a new dominant abscess in the left anterior pararenal space.  The proximal duodenum and stomach are distended, likely secondary to mechanical obstruction  or inflammatory ileus.  There is also mild bilateral hydronephrosis, and a fluid collection within the left hemi pelvis is larger now.  The right lower quadrant ileostomy is stable in appearance.  The osseous structures, bladder, pancreas, adrenal glands, liver, spleen are unremarkable.  IMPRESSION: Persistent pneumoperitoneum is again worrisome for anastomotic breakdown.  The largest abscesses within both lower quadrants have decreased in size after drainage tube placement; however, there is a new dominant abscess in the left anterior pararenal space. Additional smaller abscesses also remain present throughout the abdomen.  Tracking of gas in the subumbilical soft tissues has increased and there is adjacent intraperitoneal gas which is worrisome for fistula formation.  Original Report Authenticated By: Brandon Melnick, M.D.    Anti-infectives: Anti-infectives     Start     Dose/Rate Route Frequency Ordered Stop   02/20/12 1415   cefTRIAXone (ROCEPHIN) 1 g in dextrose 5 % 50 mL IVPB        1 g 100 mL/hr over 30 Minutes Intravenous Every 24 hours 02/20/12 1404  02/19/12 1800   ciprofloxacin (CIPRO) IVPB 400 mg  Status:  Discontinued        400 mg 200 mL/hr over 60 Minutes Intravenous Every 12 hours 02/19/12 1612 02/20/12 1404   02/19/12 1700   metroNIDAZOLE (FLAGYL) IVPB 500 mg        500 mg 100 mL/hr over 60 Minutes  Intravenous 3 times per day 02/19/12 1612     02/12/12 0800   piperacillin-tazobactam (ZOSYN) IVPB 3.375 g  Status:  Discontinued        3.375 g 12.5 mL/hr over 240 Minutes Intravenous 3 times per day 02/12/12 0800 02/19/12 1612   02/10/12 1000   amoxicillin-clavulanate (AUGMENTIN) 875-125 MG per tablet 1 tablet  Status:  Discontinued        1 tablet Oral Every 12 hours 02/10/12 0823 02/12/12 0800   02/04/12 1800   cefOXitin (MEFOXIN) 1 g in dextrose 5 % 50 mL IVPB        1 g 100 mL/hr over 30 Minutes Intravenous Every 6 hours 02/04/12 1621 02/05/12 0617   02/04/12 0000   cefOXitin (MEFOXIN) 2 g in dextrose 5 % 50 mL IVPB        2 g 100 mL/hr over 30 Minutes Intravenous 60 min pre-op 02/03/12 0333 02/04/12 1145   02/03/12 0500   cefOXitin (MEFOXIN) 2 g in dextrose 5 % 50 mL IVPB  Status:  Discontinued        2 g 100 mL/hr over 30 Minutes Intravenous 60 min pre-op 02/02/12 1156 02/03/12 0333   02/03/12 0000   cefOXitin (MEFOXIN) 2 g in dextrose 5 % 50 mL IVPB  Status:  Discontinued        2 g 100 mL/hr over 30 Minutes Intravenous 60 min pre-op 02/02/12 1004 02/02/12 1156   01/27/12 2200   cefTRIAXone (ROCEPHIN) 1 g in dextrose 5 % 50 mL IVPB        1 g 100 mL/hr over 30 Minutes Intravenous Every 24 hours 01/27/12 2152 01/29/12 2359   01/27/12 1915   cefTRIAXone (ROCEPHIN) 1 g in dextrose 5 % 50 mL IVPB        1 g 100 mL/hr over 30 Minutes Intravenous  Once 01/27/12 1914 01/27/12 1957          Assessment/Plan: s/p abd/pelvic abscess drainages (x3)  5/23; now with new left anterior pararenal/additional smaller abd abscesses noted on recent CT, 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

## 2012-02-21 NOTE — Progress Notes (Signed)
02/21/2012, 8:18 AM  Hospital day: 26 Antibiotics: cipro/flagyl  Subjective: Awake and alert, very uncomfortable with dry, bleeding mouth, very thirsty, rectal discomfort from ongoing bleeding there with clots being passed. Tells me she was unable to sleep due to rectal discomfort and thirst. Is NPO per surgery. Had total 3 cbc's done 02-20-12 (0330, 1140 am and 2356; I cannot tell if 1156 cbc was called. On 02-20-2012 she had 1 phoresis pack platelets 0640, 1 at 1748, and 1 at 1955. Platelet count was <5 on all CBCs; I have ordered 2 additional platelet phoreses now. Pain is better controlled in abdomen on dilaudid PCA. Objective: Vital signs in last 24 hours: Blood pressure 109/77, pulse 103, temperature 97.9 F (36.6 C), temperature source Oral, resp. rate 16, height 5\' 4"  (1.626 m), weight 156 lb 12 oz (71.1 kg), SpO2 100.00%. Awake and alert, able to turn side to side, begging to drink, cooperative. Dried blood on lips, mouth very dry. Not icteric. PERRL. Lungs without wheezes, rales. Cor tachy, regular. Abdomen distended as previously, not tight. Some small blood in JP, none oozing from sites of drains or incision. No apparent vaginal bleeding. Gelatinous clots from rectum continuously, pad saturated. Lower buttocks with superficial skin irritation. LE without edema or cords, feet warm. Intake/Output from previous day: 05/30 0701 - 05/31 0700 In: 3961.8 [I.V.:2159.8; ZOXWR:6045; IV Piggyback:350] Out: 701 [Urine:500; Drains:51; Stool:150] Intake/Output this shift:      Lab Results:  Basename 02/20/12 2356 02/20/12 1140  WBC 19.0* 13.2*  HGB 6.8* 6.7*  HCT 19.7* 19.1*  PLT <5* <5*<5*  2 phoresis packs platelets ordered now. 2 u PRBCs ordered now. BMET  Basename 02/20/12 0330  NA 126*  K 3.4*  CL 89*  CO2 26  GLUCOSE 111*  BUN 7  CREATININE 0.46*  CALCIUM 9.7  STAT Bmet ordered now.  No schistocytes seen.  HIT pending. Multiple cultures noted.  Studies/Results: Ct  Abdomen Pelvis W Contrast  02/19/2012  *RADIOLOGY REPORT*  Clinical Data: Elevated white count, pelvic abscesses  CT ABDOMEN AND PELVIS WITH CONTRAST  Technique:  Multidetector CT imaging of the abdomen and pelvis was performed following the standard protocol during bolus administration of intravenous contrast.  Contrast: OMNIPAQUE IOHEXOL 300 MG/ML  SOLN  Comparison: Feb 13, 2012 and Feb 12, 2012  Findings: Bibasilar atelectasis and small pleural effusions remain present.  There is persistent pneumoperitoneum, scattered throughout the abdomen and pelvis.  There also multiple loculated fluid collections throughout the abdomen, some of which contain gas.  Gas also tracks along the subumbilical soft tissues and has increased. Drainage tubes remain present within both lower quadrants.  The largest fluid collections within both lower quadrants have decreased slightly in size but the one on the right contains more gas now and tracts further along the lateral liver margin.  There is a new dominant abscess in the left anterior pararenal space.  The proximal duodenum and stomach are distended, likely secondary to mechanical obstruction  or inflammatory ileus.  There is also mild bilateral hydronephrosis, and a fluid collection within the left hemi pelvis is larger now.  The right lower quadrant ileostomy is stable in appearance.  The osseous structures, bladder, pancreas, adrenal glands, liver, spleen are unremarkable.  IMPRESSION: Persistent pneumoperitoneum is again worrisome for anastomotic breakdown.  The largest abscesses within both lower quadrants have decreased in size after drainage tube placement; however, there is a new dominant abscess in the left anterior pararenal space. Additional smaller abscesses also remain present throughout  the abdomen.  Tracking of gas in the subumbilical soft tissues has increased and there is adjacent intraperitoneal gas which is worrisome for fistula formation.  Original  Report Authenticated By: Brandon Melnick, M.D.   Discussed with RN at bedside. Discussed with Dr Donell Beers on unit. Very difficult situation, very ill.  Assessment/Plan: 1.Acute drop in platelets: Medication related and/or septic with multiple intraabdominal abscesses/anastomotic breakdown, ongoing bleeding from rectum and some oral mucosal bleeding. Need to continue platelet transfusions aggressively now. I do not think this is ITP and would not give high dose steroids due to abdominal abscesses now. I do not think amicar can be used now. I will discuss with my partners also. 2.Bowel injury apparently related to previous RT for cervical cancer 3. No known active cervical cancer now 4.anemia related to ongoing bleeding. 5.electrolyte abnormalities: stat bmet ordered now and daily chemistries 6.poor nutritional status, now NPO.  Would consider ID/ CCM consults to assist.  Laverna Dossett P

## 2012-02-22 ENCOUNTER — Encounter (HOSPITAL_COMMUNITY): Payer: Self-pay | Admitting: Anesthesiology

## 2012-02-22 ENCOUNTER — Other Ambulatory Visit: Payer: Self-pay | Admitting: Oncology

## 2012-02-22 DIAGNOSIS — N12 Tubulo-interstitial nephritis, not specified as acute or chronic: Secondary | ICD-10-CM

## 2012-02-22 LAB — CBC
HCT: 20.4 % — ABNORMAL LOW (ref 36.0–46.0)
HCT: 14.9 % — ABNORMAL LOW (ref 36.0–46.0)
HCT: 20.6 % — ABNORMAL LOW (ref 36.0–46.0)
HCT: 19.8 % — ABNORMAL LOW (ref 36.0–46.0)
Hemoglobin: 6.1 g/dL — CL (ref 12.0–15.0)
Hemoglobin: 7.2 g/dL — ABNORMAL LOW (ref 12.0–15.0)
Hemoglobin: 7.4 g/dL — ABNORMAL LOW (ref 12.0–15.0)
MCH: 30.3 pg (ref 26.0–34.0)
MCH: 30.4 pg (ref 26.0–34.0)
MCHC: 35.3 g/dL (ref 30.0–36.0)
MCHC: 35.9 g/dL (ref 30.0–36.0)
MCV: 90.9 fL (ref 78.0–100.0)
Platelets: <5 10*3/uL — CL (ref 150–400)
Platelets: <5 10*3/uL — CL (ref 150–400)
RBC: 2.01 MIL/uL — ABNORMAL LOW (ref 3.87–5.11)
RBC: 2.37 MIL/uL — ABNORMAL LOW (ref 3.87–5.11)
RBC: 1.64 MIL/uL — ABNORMAL LOW (ref 3.87–5.11)
RDW: 15.4 % (ref 11.5–15.5)
RDW: 15.7 % — ABNORMAL HIGH (ref 11.5–15.5)
WBC: 14.8 10*3/uL — ABNORMAL HIGH (ref 4.0–10.5)
WBC: 11.9 10*3/uL — ABNORMAL HIGH (ref 4.0–10.5)
WBC: 13 10*3/uL — ABNORMAL HIGH (ref 4.0–10.5)
WBC: 13.6 10*3/uL — ABNORMAL HIGH (ref 4.0–10.5)

## 2012-02-22 LAB — DIC (DISSEMINATED INTRAVASCULAR COAGULATION)PANEL
D-Dimer, Quant: 4.96 ug/mL-FEU — ABNORMAL HIGH (ref 0.00–0.48)
Fibrinogen: 450 mg/dL (ref 204–475)
INR: 1.41 (ref 0.00–1.49)
Platelets: <5 10*3/uL — CL (ref 150–400)
Smear Review: NONE SEEN
aPTT: 33 seconds (ref 24–37)

## 2012-02-22 LAB — PREPARE PLATELET PHERESIS: Unit division: 0

## 2012-02-22 LAB — DIFFERENTIAL
Basophils Relative: 1 % (ref 0–1)
Eosinophils Absolute: 0.1 10*3/uL (ref 0.0–0.7)
Eosinophils Relative: 1 % (ref 0–5)
Lymphocytes Relative: 15 % (ref 12–46)
Neutro Abs: 10.8 10*3/uL — ABNORMAL HIGH (ref 1.7–7.7)
Neutrophils Relative %: 74 % (ref 43–77)
WBC Morphology: INCREASED

## 2012-02-22 LAB — COMPREHENSIVE METABOLIC PANEL
ALT: 17 U/L (ref 0–35)
AST: 19 U/L (ref 0–37)
Albumin: 2.6 g/dL — ABNORMAL LOW (ref 3.5–5.2)
CO2: 26 mEq/L (ref 19–32)
Chloride: 103 mEq/L (ref 96–112)
GFR calc non Af Amer: >90 mL/min (ref >90)
Sodium: 139 mEq/L (ref 135–145)
Total Bilirubin: 0.8 mg/dL (ref 0.3–1.2)

## 2012-02-22 LAB — PROTIME-INR: Prothrombin Time: 15.3 seconds — ABNORMAL HIGH (ref 11.6–15.2)

## 2012-02-22 LAB — PHOSPHORUS: Phosphorus: 2.5 mg/dL (ref 2.3–4.6)

## 2012-02-22 LAB — TRIGLYCERIDES: Triglycerides: 91 mg/dL (ref <150)

## 2012-02-22 MED ORDER — SODIUM CHLORIDE 0.9 % IV SOLN
INTRAVENOUS | Status: DC
  Administered 2012-02-22 – 2012-02-23 (×2): via INTRAVENOUS
  Filled 2012-02-22 (×4): qty 1000

## 2012-02-22 MED ORDER — SODIUM CHLORIDE 0.9 % IV SOLN: 5.0000 g/h | Freq: Once | INTRAVENOUS | Status: DC

## 2012-02-22 MED ORDER — IMMUNE GLOBULIN (HUMAN) 10 GM/100ML IV SOLN
1.0000 g/kg | INTRAVENOUS | Status: AC
  Administered 2012-02-22 – 2012-02-23 (×2): 70 g via INTRAVENOUS
  Filled 2012-02-22 (×6): qty 700

## 2012-02-22 MED ORDER — DEXAMETHASONE SODIUM PHOSPHATE 4 MG/ML IJ SOLN
40.0000 mg | INTRAMUSCULAR | Status: DC
  Filled 2012-02-22 (×2): qty 10

## 2012-02-22 MED ORDER — MAGNESIUM SULFATE 40 MG/ML IJ SOLN
2.0000 g | Freq: Once | INTRAMUSCULAR | Status: AC
  Administered 2012-02-22: 2 g via INTRAVENOUS
  Filled 2012-02-22: qty 50

## 2012-02-22 MED ORDER — POTASSIUM CHLORIDE 10 MEQ/50ML IV SOLN
10.0000 meq | INTRAVENOUS | Status: AC
  Administered 2012-02-22 (×4): 10 meq via INTRAVENOUS
  Filled 2012-02-22 (×3): qty 50

## 2012-02-22 MED ORDER — SODIUM CHLORIDE 0.9 % IV SOLN: 1.0000 g/h | INTRAVENOUS | Status: DC

## 2012-02-22 MED ORDER — GATORADE (BH)
240.0000 mL | Freq: Four times a day (QID) | ORAL | Status: DC
Start: 2012-02-22 — End: 2012-02-22

## 2012-02-22 MED ORDER — CLINIMIX E/DEXTROSE (5/20) 5 % IV SOLN
INTRAVENOUS | Status: AC
  Administered 2012-02-22: 17:00:00 via INTRAVENOUS
  Filled 2012-02-22: qty 2000

## 2012-02-22 MED ORDER — FAMOTIDINE IN NACL 20-0.9 MG/50ML-% IV SOLN
20.0000 mg | Freq: Two times a day (BID) | INTRAVENOUS | Status: DC
  Administered 2012-02-22 – 2012-02-23 (×4): 20 mg via INTRAVENOUS
  Filled 2012-02-22 (×6): qty 50

## 2012-02-22 MED ORDER — ROMIPLOSTIM 250 MCG ~~LOC~~ SOLR
2.0000 ug/kg | Freq: Once | SUBCUTANEOUS | Status: AC
  Administered 2012-02-22: 140 ug via SUBCUTANEOUS
  Filled 2012-02-22 (×2): qty 0.28

## 2012-02-22 MED ORDER — SODIUM CHLORIDE 0.9 % IV SOLN
40.0000 mg | INTRAVENOUS | Status: DC
  Administered 2012-02-22 – 2012-02-23 (×2): 40 mg via INTRAVENOUS
  Filled 2012-02-22 (×2): qty 4

## 2012-02-22 NOTE — Progress Notes (Signed)
PARENTERAL NUTRITION CONSULT NOTE - FOLLOW UP  Pharmacy Consult for TPN Indication: intraabdominal abscess, ileus  Allergies  Allergen Reactions  . Labetalol Hcl Itching and Other (See Comments)    Bumps to bilateral arms.  . Morphine And Related Hives    Patient Measurements: Height: 5\' 4"  (162.6 cm) Weight: 156 lb 12 oz (71.1 kg) IBW/kg (Calculated) : 54.7   Vital Signs: Temp: 98.4 F (36.9 C) (06/01 0615) Temp src: Oral (06/01 0615) BP: 140/90 mmHg (06/01 0615) Pulse Rate: 121  (06/01 0615) Intake/Output from previous day: 05/31 0701 - 06/01 0700 In: 7202.3 [I.V.:3620; Blood:2863.5; IV Piggyback:268.8; TPN:450] Out: 2695 [Urine:2500; Drains:95; Stool:100] Intake/Output from this shift:    Labs:  Basename 02/22/12 0635 02/22/12 0634 02/22/12 0300 02/21/12 1800 02/20/12 2356 02/20/12 1140 02/19/12 1712  WBC 14.5* -- 14.8* 16.1* -- -- --  HGB 7.2* -- 6.1* 7.9* -- -- --  HCT 20.4* -- 17.0* 22.2* -- -- --  PLT <5* <5* <5* -- -- -- --  APTT -- 33 -- -- -- 36 40*  INR 1.18 1.41 -- -- 1.26 -- --     Basename 02/21/12 0853 02/20/12 0330  NA 134* 126*  K 3.6 3.4*  CL 98 89*  CO2 25 26  GLUCOSE 95 111*  BUN 16 7  CREATININE 0.82 0.46*  LABCREA -- --  CREAT24HRUR -- --  CALCIUM 9.5 9.7  MG -- --  PHOS -- --  PROT -- 6.5  ALBUMIN -- 2.2*  AST -- 16  ALT -- 19  ALKPHOS -- 103  BILITOT -- 0.9  BILIDIR -- --  IBILI -- --  PREALBUMIN -- --  TRIG -- --  CHOLHDL -- --  CHOL -- --   Estimated Creatinine Clearance: 91.8 ml/min (by C-G formula based on Cr of 0.82).   No results found for this basename: GLUCAP:3 in the last 72 hours  Medications:  Scheduled:    . acetaminophen  650 mg Oral Once  . acetaminophen  650 mg Oral Once  . barrier cream  1 application Topical TID  . cefTRIAXone (ROCEPHIN)  IV  1 g Intravenous Q24H  . dexamethasone (DECADRON) IVPB  40 mg Intravenous Q24H  . diphenhydrAMINE  25 mg Intravenous Once  . feeding supplement  237 mL Oral  BID BM  . gatorade (BH)  240 mL Oral Once  . hydrochlorothiazide  12.5 mg Oral Daily  . HYDROmorphone PCA 0.3 mg/mL   Intravenous Q4H  . IMMUNE GLOBULIN 10% (HUMAN) IV - For Fluid Restriction Only  1 g/kg Intravenous Q24H  . insulin aspart  0-9 Units Subcutaneous Q4H  . metronidazole  500 mg Intravenous Q8H  . morphine  15 mg Oral QHS  . morphine  30 mg Oral Daily  . mulitivitamin with minerals  1 tablet Oral Daily  . pantoprazole (PROTONIX) IV  40 mg Intravenous QHS  . romiPLOStim  2 mcg/kg Subcutaneous Once  . sodium chloride  10-40 mL Intracatheter Q12H  . sodium chloride      . DISCONTD: aminocaproic acid (AMICAR) IVPB  5 g/hr Intravenous Once  . DISCONTD: dexamethasone  40 mg Intravenous Q24H   Infusions:    . fat emulsion 250 mL (02/21/12 2035)  . 0.9 % sodium chloride with kcl 150 mL/hr at 02/22/12 0100  . TPN (CLINIMIX) +/- additives 40 mL/hr at 02/21/12 2035  . DISCONTD: sodium chloride 150 mL/hr at 02/21/12 0827  . DISCONTD: aminocaproic acid (AMICAR) infusion (Urology)    . DISCONTD: 0.9 %  sodium chloride with kcl Stopped (02/21/12 0600)    Insulin Requirements in the past 24 hours:  2 units SSI with NO insulin in TNA with q4h sensitive SSI  Current Nutrition:  Clinimix E5/20 at 40 ml/hr  Nutritional Goals:  1800-2000 kCal, 75-85 grams of protein per day Clinimix E5/20 goal rate of 27ml/hr will provide an average of 1684 kcal and 84g protein per day based on RD assessment (94% total kcal and 100% protein needs)  Assessment: 72 yof with a history of cervical cancer who is s/p sigmoid colectomy and diverting ileostomy on 5/14. Developed multiple intra-abdominal abscesses 2/2 anastomotic leak shown on CT and had 3 perc drains placed. On 5/29 patient had multiple blood BM, hemoptysis, and abdominal pain. Plts <5 (240k on 5/25). CT 5/30 showed additional abscesses and concern for fistula formation.   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 Anastomotic  breakdown leading to many intra-abdominal abscesses and concern for fistula on recent CT 5/30. Perc drains in place with 23ml/24hr drainage. Pt may need more drains placed but unable to take to surgery with low plt count. Pt with significant amount of hemoptysis/hematochezia. Unknown GI absorption. Spoke with Dr [name] -will change IV pantoprazole to IV famotidine (both have very small likelihood of thrombocytopenia, famotidine with slightly lower risk). Endo: No hx DM. CBG ok. BMET glucose at 95.  Lytes: lytes wnl, K/Mag low NL but stable, NSw/K infusion at 175ml/hr.  Renal: SCr 0.46 (5/29) --> 0.63. UOP improved Pulm: RA Cards: BP stable on Hctz only. Tachycardic into 120s  Hepatobil: Alk phos/LFTs/bili wnl from labs on 5/20. Albumin low at 1.8.  Triglycerides, cholesterol noted. Neuro: standing oxycontin and dilaudid PCA  Heme/Onc: Hx of cervical cancer, plts 5/25 at 240 and now <5 this am after multiple plt transfusions. Hgb 7.2 - heme/onc consulted. 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 ID: [email or web address] 14.5, afebrile on empiric flagyl, ceftriaxone   Rocephin 5/5 >> 5/8; 5/30>> Cefoxitin 5/14 >> 5/15 Augmentin 5/20 >> 5/21 Zosyn 5/22 >> 5/29 Cipro 5/29 >>5/30 Flagyl 5/29 >>  Micro: 5/23 RUQ Abscess: rare viridans strep; B. Fragilis (no sens) 5/23 LLQ Abscess: few E.coli (pan sens) 5/23 LUQ Abscess: multiple bacteria 5/14 MRSA PCR neg 5/6 Urine: neg  Best Practices: SCDs, IV PPI  Plan:  -Increase Clinimix E 5/20 to goal of 70 ml/hr -Decrease IVF accordingly -Replete K/Mag with 2 g magnesium and 4 k runs for goals >4/2, respectively. -TE, lipids at 10 ml/hr MWF only 2/2 national shortage -On PO MVI 2/2 national shortage of IV MVI -f/u am BMP, Mag, Phos  Arcadio Cope L. Illene Bolus, PharmD, BCPS Clinical Pharmacist Pager: [phone] 02/22/2012 7:34 AM

## 2012-02-22 NOTE — Preoperative (Signed)
Beta Blockers   Reason not to administer Beta Blockers:Not Applicable 

## 2012-02-22 NOTE — Progress Notes (Signed)
Subjective: 36 yo with hx of cervical cancer s/p xrt complicated by multiple adhesions and now intrabdominal abscesses, requiring surgery. On broad spectrum antibiotics, current acute proble is refractory thrombocytopenia., despite multiple platelet transfusions.   Objective: Temp:  [97.9 F (36.6 C)-99.7 F (37.6 C)] 98.4 F (36.9 C) (06/01 0615) Pulse Rate:  [96-124] 121  (06/01 0615) Resp:  [15-34] 29  (06/01 0615) BP: (112-140)/(69-90) 140/90 mmHg (06/01 0615) SpO2:  [96 %-99 %] 96 % (06/01 0400)  General appearance: alert, cooperative and appears stated age Throat: lips, mucosa, and tongue normal; teeth and gums normal and dried blood on lips, oropharynx no purpura, no thrush Resp: clear to auscultation bilaterally and normal percussion bilaterally GI: distended, midline wound, drains in place, sl tender throughout, colostomy in place. Extremities: extremities normal, atraumatic, no cyanosis or edema Pulses: 2+ and symmetric, no petechiae  Labs:  [facility] 02/22/12 0635 02/22/12 0634 02/22/12 0300  WBC 14.5* -- 14.8*  HGB 7.2* -- 6.1*  HCT 20.4* -- 17.0*  PLT <5* <5* --    Basename 02/22/12 0635 02/21/12 0853  NA 139 134*  K 3.6 3.6  CL 103 98  CO2 26 25  GLUCOSE 114* 95  BUN 12 16  CREATININE 0.63 0.82  CALCIUM 9.7 9.5    Results for orders placed during the hospital encounter of 01/27/12  URINE CULTURE     Status: Normal   Collection Time   01/27/12 11:54 PM      Component Value Range Status Comment   Specimen Description URINE, RANDOM   Final    Special Requests NONE   Final    Culture  Setup Time [id number]   Final    Colony Count NO GROWTH   Final    Culture NO GROWTH   Final    Report Status 01/29/2012 FINAL   Final   SURGICAL PCR SCREEN     Status: Normal   Collection Time   02/03/12  2:28 AM      Component Value Range Status Comment   MRSA, PCR NEGATIVE  NEGATIVE  Final    Staphylococcus aureus NEGATIVE  NEGATIVE  Final   SURGICAL PCR SCREEN      Status: Normal   Collection Time   02/04/12  6:26 AM      Component Value Range Status Comment   MRSA, PCR NEGATIVE  NEGATIVE  Final    Staphylococcus aureus NEGATIVE  NEGATIVE  Final   CULTURE, ROUTINE-ABSCESS     Status: Normal   Collection Time   02/13/12  6:33 PM      Component Value Range Status Comment   Specimen Description ABSCESS DRAINAGE ABDOMEN LEFT LOWER   Final    Special Requests DRAIN NO1   Final    Gram Stain     Final    Value: ABUNDANT WBC PRESENT,BOTH PMN AND MONONUCLEAR     NO SQUAMOUS EPITHELIAL CELLS SEEN     ABUNDANT GRAM POSITIVE COCCI IN PAIRS     ABUNDANT GRAM POSITIVE RODS     ABUNDANT GRAM NEGATIVE RODS   Culture FEW ESCHERICHIA COLI   Final    Report Status 02/17/2012 FINAL   Final    Organism ID, Bacteria ESCHERICHIA COLI   Final   CULTURE, ROUTINE-ABSCESS     Status: Normal   Collection Time   02/13/12  7:00 PM      Component Value Range Status Comment   Specimen Description ABSCESS ABDOMEN DRAINAGE LEFT UPPER   Final    Special  Requests DRAIN NO 2   Final    Gram Stain     Final    Value: ABUNDANT WBC PRESENT,BOTH PMN AND MONONUCLEAR     NO SQUAMOUS EPITHELIAL CELLS SEEN     MODERATE GRAM POSITIVE COCCI IN PAIRS AND CHAINS     ABUNDANT GRAM NEGATIVE RODS   Culture     Final    Value: MULTIPLE ORGANISMS PRESENT, NONE PREDOMINANT     Note: NO STAPHYLOCOCCUS AUREUS ISOLATED NO GROUP A STREP (S.PYOGENES) ISOLATED   Report Status 02/17/2012 FINAL   Final   CULTURE, ROUTINE-ABSCESS     Status: Normal   Collection Time   02/13/12  7:00 PM      Component Value Range Status Comment   Specimen Description ABSCESS ABDOMEN DRAINAGE RIGHT UPPER   Final    Special Requests DRAIN NO 3   Final    Gram Stain     Final    Value: ABUNDANT WBC PRESENT,BOTH PMN AND MONONUCLEAR     NO SQUAMOUS EPITHELIAL CELLS SEEN     ABUNDANT GRAM POSITIVE COCCI IN PAIRS     ABUNDANT GRAM NEGATIVE RODS   Culture     Final    Value: RARE VIRIDANS STREPTOCOCCUS     ABUNDANT  BACTEROIDES FRAGILIS     Note: BETA LACTAMASE POSITIVE   Report Status 02/17/2012 FINAL   Final      No results found.:  Pathology: n/a  Principal Problem:  *Sigmoid stricture Active Problems:  Cervical cancer  Post-radiation rectal bleeding  Pyelonephritis  Nausea & vomiting  Hypercalcemia  Radiation colitis  Hemorrhage of rectum and anus  Acute posthemorrhagic anemia  Thrombocytopenia :  Disposition : Difficult problem, labs pending. I will review smear again. IVIG and steroids started by dr Myna Hidalgo which I think is reasonable, This pt is multiparous ( 8 children) and so she likely has circulating antibodies. I have drawn a anti-plt panel and HLA phenotype for the possibility of obtaining single donor plts, if the need arises. I will also check haptoglobin , LDH and ANA. She is having a small amt of rectal bleeding, which is fortunate.Her Hg is relatively stable,. Apparently amicar is on backorder, which might be another option. Will follow carefully.    Pierce Crane MD 02/22/2012

## 2012-02-22 NOTE — Progress Notes (Signed)
Nutrition Follow-up  Diet Order:  NPO  Pt sleeping at time of visit, RD did not wake.  Pt currently on Clinimix 5/20 @ 40 mL/hr with 20% IV lipids @ 10 mL/hr MWF due to national shortage providing 1050 kcal, 48 g protein.  Pt to advance to goal rate of 70 mL/hr with 20% IV lipids @ 10 mL/hr MWF which will provide 1684 kcal, 84 g protein meeting 93% estimated kcal needs, 100% estimated protein needs.  Meds: Scheduled Meds:   . acetaminophen  650 mg Oral Once  . barrier cream  1 application Topical TID  . cefTRIAXone (ROCEPHIN)  IV  1 g Intravenous Q24H  . dexamethasone (DECADRON) IVPB  40 mg Intravenous Q24H  . diphenhydrAMINE  25 mg Intravenous Once  . famotidine (PEPCID) IV  20 mg Intravenous Q12H  . feeding supplement  237 mL Oral BID BM  . gatorade (BH)  240 mL Oral Once  . hydrochlorothiazide  12.5 mg Oral Daily  . HYDROmorphone PCA 0.3 mg/mL   Intravenous Q4H  . IMMUNE GLOBULIN 10% (HUMAN) IV - For Fluid Restriction Only  1 g/kg Intravenous Q24H  . insulin aspart  0-9 Units Subcutaneous Q4H  . magnesium sulfate 1 - 4 g bolus IVPB  2 g Intravenous Once  . metronidazole  500 mg Intravenous Q8H  . morphine  15 mg Oral QHS  . morphine  30 mg Oral Daily  . mulitivitamin with minerals  1 tablet Oral Daily  . potassium chloride  10 mEq Intravenous Q1 Hr x 4  . romiPLOStim  2 mcg/kg Subcutaneous Once  . sodium chloride  10-40 mL Intracatheter Q12H  . sodium chloride      . DISCONTD: aminocaproic acid (AMICAR) IVPB  5 g/hr Intravenous Once  . DISCONTD: dexamethasone  40 mg Intravenous Q24H  . DISCONTD: gatorade (BH)  240 mL Oral QID  . DISCONTD: pantoprazole (PROTONIX) IV  40 mg Intravenous QHS   Continuous Infusions:   . fat emulsion 250 mL (02/21/12 2035)  . 0.9 % sodium chloride with kcl    . TPN (CLINIMIX) +/- additives 40 mL/hr at 02/21/12 2035  . TPN (CLINIMIX) +/- additives    . DISCONTD: sodium chloride 150 mL/hr at 02/21/12 0827  . DISCONTD: aminocaproic acid (AMICAR)  infusion (Urology)    . DISCONTD: 0.9 % sodium chloride with kcl 150 mL/hr at 02/22/12 0100   PRN Meds:.acetaminophen, alum & mag hydroxide-simeth, diphenhydrAMINE, diphenhydrAMINE, HYDROmorphone (DILAUDID) injection, naloxone, ondansetron (ZOFRAN) IV, ondansetron (ZOFRAN) IV, ondansetron, oxyCODONE-acetaminophen, simethicone, sodium chloride, sodium chloride, zolpidem  Labs:  CMP     Component Value Date/Time   NA 139 02/22/2012 0635   K 3.6 02/22/2012 0635   CL 103 02/22/2012 0635   CO2 26 02/22/2012 0635   GLUCOSE 114* 02/22/2012 0635   BUN 12 02/22/2012 0635   CREATININE 0.63 02/22/2012 0635   CALCIUM 9.7 02/22/2012 0635   CALCIUM 10.7* 02/01/2012 0705   PROT 6.1 02/22/2012 0635   ALBUMIN 2.6* 02/22/2012 0635   AST 19 02/22/2012 0635   ALT 17 02/22/2012 0635   ALKPHOS 77 02/22/2012 0635   BILITOT 0.8 02/22/2012 0635   GFRNONAA >90 02/22/2012 0635   GFRAA >90 02/22/2012 0635     Intake/Output Summary (Last 24 hours) at 02/22/12 1107 Last data filed at 02/22/12 1610  Gross per 24 hour  Intake 6413.25 ml  Output   2395 ml  Net 4018.25 ml    Weight Status:  No new wt  Re-statement of needs:  1800-2000 kcal, 75-85g protein  Nutrition Dx:  Inadequate oral intake, ongoing. Pt now on TPN  Intervention:   1.  Parenteral nutrition; continued management by PharmD with pt meeting 90-100% estimated needs.  If pt continues to demonstrate wt loss with <100% of needs met, consider increasing regimen to provide additional kcal and protein. 2.  Wt/wt change; obtain new wt  Monitor:   1.  Food/Beverage; improvement in intake with resolve of abdominal pain.  Not met, pt with gatorade at bedside however minimal amount consumed. 2.  Wt/wt change; follow trends. Continue.     Hoyt Koch Pager #:  [phone]

## 2012-02-22 NOTE — Anesthesia Preprocedure Evaluation (Deleted)
Anesthesia Evaluation  Patient identified by MRN, date of birth, ID band  Airway       Dental   Pulmonary          Cardiovascular hypertension,     Neuro/Psych    GI/Hepatic S/p colon resection   Endo/Other    Renal/GU      Musculoskeletal   Abdominal   Peds  Hematology   Anesthesia Other Findings   Reproductive/Obstetrics                           Anesthesia Physical Anesthesia Plan Anesthesia Quick Evaluation

## 2012-02-22 NOTE — Progress Notes (Signed)
18 Days Post-Op  Subjective: Despite ongoing plt transfusions, plts still <5. Had reached 45K yesterday but returned to <5 yesterday evening. Still had some dark bloody clots per rectum; started on IVIG and decadron per Dr Myna Hidalgo  Objective: Vital signs in last 24 hours: Temp:  [97.9 F (36.6 C)-99.7 F (37.6 C)] 98.4 F (36.9 C) (06/01 0615) Pulse Rate:  [96-124] 121  (06/01 0615) Resp:  [15-34] 29  (06/01 0615) BP: (112-140)/(69-90) 140/90 mmHg (06/01 0615) SpO2:  [96 %-99 %] 96 % (06/01 0400) Last BM Date: 02/21/12  Intake/Output from previous day: 05/31 0701 - 06/01 0700 In: 7202.3 [I.V.:3620; Blood:2863.5; IV Piggyback:268.8; TPN:450] Out: 2695 [Urine:2500; Drains:95; Stool:100] Intake/Output this shift:    Alert, resting comfortably cta anteriorly Tachy Soft, distended, mild diffuse TTP. Air in bag  Lab Results:   Basename 02/22/12 1610 02/22/12 0634 02/22/12 0300  WBC 14.5* -- 14.8*  HGB 7.2* -- 6.1*  HCT 20.4* -- 17.0*  PLT <5* <5* --   BMET  Basename 02/22/12 0635 02/21/12 0853  NA 139 134*  K 3.6 3.6  CL 103 98  CO2 26 25  GLUCOSE 114* 95  BUN 12 16  CREATININE 0.63 0.82  CALCIUM 9.7 9.5   PT/INR  Basename 02/22/12 0635 02/22/12 0634  LABPROT 15.3* 17.5*  INR 1.18 1.41   ABG No results found for this basename: PHART:2,PCO2:2,PO2:2,HCO3:2 in the last 72 hours  Studies/Results: No results found.  Anti-infectives: Anti-infectives     Start     Dose/Rate Route Frequency Ordered Stop   02/20/12 1415   cefTRIAXone (ROCEPHIN) 1 g in dextrose 5 % 50 mL IVPB        1 g 100 mL/hr over 30 Minutes Intravenous Every 24 hours 02/20/12 1404     02/19/12 1800   ciprofloxacin (CIPRO) IVPB 400 mg  Status:  Discontinued        400 mg 200 mL/hr over 60 Minutes Intravenous Every 12 hours 02/19/12 1612 02/20/12 1404   02/19/12 1700   metroNIDAZOLE (FLAGYL) IVPB 500 mg        500 mg 100 mL/hr over 60 Minutes Intravenous 3 times per day 02/19/12 1612     02/12/12 0800   piperacillin-tazobactam (ZOSYN) IVPB 3.375 g  Status:  Discontinued        3.375 g 12.5 mL/hr over 240 Minutes Intravenous 3 times per day 02/12/12 0800 02/19/12 1612   02/10/12 1000   amoxicillin-clavulanate (AUGMENTIN) 875-125 MG per tablet 1 tablet  Status:  Discontinued        1 tablet Oral Every 12 hours 02/10/12 0823 02/12/12 0800   02/04/12 1800   cefOXitin (MEFOXIN) 1 g in dextrose 5 % 50 mL IVPB        1 g 100 mL/hr over 30 Minutes Intravenous Every 6 hours 02/04/12 1621 02/05/12 0617   02/04/12 0000   cefOXitin (MEFOXIN) 2 g in dextrose 5 % 50 mL IVPB        2 g 100 mL/hr over 30 Minutes Intravenous 60 min pre-op 02/03/12 0333 02/04/12 1145   02/03/12 0500   cefOXitin (MEFOXIN) 2 g in dextrose 5 % 50 mL IVPB  Status:  Discontinued        2 g 100 mL/hr over 30 Minutes Intravenous 60 min pre-op 02/02/12 1156 02/03/12 0333   02/03/12 0000   cefOXitin (MEFOXIN) 2 g in dextrose 5 % 50 mL IVPB  Status:  Discontinued        2 g 100 mL/hr  over 30 Minutes Intravenous 60 min pre-op 02/02/12 1004 02/02/12 1156   01/27/12 2200   cefTRIAXone (ROCEPHIN) 1 g in dextrose 5 % 50 mL IVPB        1 g 100 mL/hr over 30 Minutes Intravenous Every 24 hours 01/27/12 2152 01/29/12 2359   01/27/12 1915   cefTRIAXone (ROCEPHIN) 1 g in dextrose 5 % 50 mL IVPB        1 g 100 mL/hr over 30 Minutes Intravenous  Once 01/27/12 1914 01/27/12 1957          Assessment/Plan: s/p Procedure(s) (LRB): COLON RESECTION SIGMOID (N/A) with diverting loop ileostomy Multiple intra-abd abscesses s/p perc drain; likely anastomotic breakdown ABL anemia Thrombocytopenia - unclear etiology, ?med related; IVIG and decadron ordered per Hematology Cont IV abx  Will hold off on surgery for today. plt count still dangerously low for surgery. Pt will need washout of abdomen. Currently clinically stable - no fever, stable wbc, no peritonitis. Will see if IVIG and decadron will help with plt count.  However, if pt clinically deteriorates may have to risk surgery.   F/u HIT panel  Vanessa [name]. Vanessa Campanile, MD, FACS General, Bariatric, & Minimally Invasive Surgery [facility], [place]    LOS: 26 days    Vanessa [name] 02/22/2012

## 2012-02-23 ENCOUNTER — Encounter (HOSPITAL_COMMUNITY): Admission: EM | Disposition: A | Discharge status: Home Health | Payer: Self-pay | Source: Home / Self Care

## 2012-02-23 DIAGNOSIS — D6959 Other secondary thrombocytopenia: Secondary | ICD-10-CM

## 2012-02-23 LAB — TYPE AND SCREEN
Antibody Screen: NEGATIVE
Unit division: 0
Unit division: 0
Unit division: 0
Unit division: 0

## 2012-02-23 LAB — CBC
HCT: 23.8 % — ABNORMAL LOW (ref 36.0–46.0)
Hemoglobin: 6.7 g/dL — CL (ref 12.0–15.0)
Hemoglobin: 8.6 g/dL — ABNORMAL LOW (ref 12.0–15.0)
MCH: 31.6 pg (ref 26.0–34.0)
Platelets: <5 10*3/uL — CL (ref 150–400)
Platelets: <5 10*3/uL — CL (ref 150–400)
RBC: 2.12 MIL/uL — ABNORMAL LOW (ref 3.87–5.11)
RBC: 1.85 MIL/uL — ABNORMAL LOW (ref 3.87–5.11)
WBC: 16.3 10*3/uL — ABNORMAL HIGH (ref 4.0–10.5)
WBC: 17.8 10*3/uL — ABNORMAL HIGH (ref 4.0–10.5)

## 2012-02-23 LAB — PREPARE FRESH FROZEN PLASMA
Unit division: 0
Unit division: 0

## 2012-02-23 LAB — BASIC METABOLIC PANEL
Calcium: 9.8 mg/dL (ref 8.4–10.5)
GFR calc Af Amer: >90 mL/min (ref >90)
GFR calc non Af Amer: >90 mL/min (ref >90)
Potassium: 4.4 mEq/L (ref 3.5–5.1)
Sodium: 131 mEq/L — ABNORMAL LOW (ref 135–145)

## 2012-02-23 LAB — MAGNESIUM: Magnesium: 1.6 mg/dL (ref 1.5–2.5)

## 2012-02-23 LAB — PREPARE RBC (CROSSMATCH)

## 2012-02-23 LAB — PHOSPHORUS: Phosphorus: 2 mg/dL — ABNORMAL LOW (ref 2.3–4.6)

## 2012-02-23 LAB — PREPARE PLATELET PHERESIS: Unit division: 0

## 2012-02-23 SURGERY — LAPAROTOMY, EXPLORATORY
Anesthesia: General

## 2012-02-23 MED ORDER — SODIUM CHLORIDE 0.9 % IV SOLN
INTRAVENOUS | Status: DC
  Administered 2012-02-24 – 2012-02-25 (×2): via INTRAVENOUS
  Filled 2012-02-23 (×7): qty 1000

## 2012-02-23 MED ORDER — ENALAPRILAT 1.25 MG/ML IV SOLN
0.6250 mg | Freq: Four times a day (QID) | INTRAVENOUS | Status: DC | PRN
  Administered 2012-02-23: 0.625 mg via INTRAVENOUS
  Filled 2012-02-23: qty 1

## 2012-02-23 MED ORDER — ADULT MULTIVITAMIN W/MINERALS CH
1.0000 | ORAL_TABLET | Freq: Every day | ORAL | Status: DC
  Administered 2012-02-23 – 2012-02-29 (×7): 1 via ORAL
  Filled 2012-02-23 (×9): qty 1

## 2012-02-23 MED ORDER — CLINIMIX E/DEXTROSE (5/20) 5 % IV SOLN
INTRAVENOUS | Status: AC
  Administered 2012-02-23: 18:00:00 via INTRAVENOUS
  Filled 2012-02-23: qty 2000

## 2012-02-23 MED ORDER — SODIUM PHOSPHATE 3 MMOLE/ML IV SOLN
10.0000 mmol | Freq: Once | INTRAVENOUS | Status: AC
  Administered 2012-02-23: 10 mmol via INTRAVENOUS
  Filled 2012-02-23: qty 3.33

## 2012-02-23 MED ORDER — ALTEPLASE 100 MG IV SOLR
2.0000 mg | Freq: Once | INTRAVENOUS | Status: AC
  Administered 2012-02-23: 2 mg
  Filled 2012-02-23: qty 2

## 2012-02-23 MED ORDER — MAGNESIUM SULFATE 40 MG/ML IJ SOLN
2.0000 g | Freq: Once | INTRAMUSCULAR | Status: AC
  Administered 2012-02-23: 2 g via INTRAVENOUS
  Filled 2012-02-23: qty 50

## 2012-02-23 MED ORDER — METHYLPREDNISOLONE SODIUM SUCC 125 MG IJ SOLR
60.0000 mg | Freq: Two times a day (BID) | INTRAMUSCULAR | Status: DC
  Administered 2012-02-23 – 2012-02-25 (×6): 60 mg via INTRAVENOUS
  Filled 2012-02-23 (×7): qty 0.96

## 2012-02-23 MED ORDER — ALTEPLASE 2 MG IJ SOLR
2.0000 mg | Freq: Once | INTRAMUSCULAR | Status: DC
  Filled 2012-02-23 (×2): qty 2

## 2012-02-23 SURGICAL SUPPLY — 33 items
BLADE SURG ROTATE 9660 (MISCELLANEOUS) IMPLANT
CANISTER SUCTION 2500CC (MISCELLANEOUS) ×2 IMPLANT
CHLORAPREP W/TINT 26ML (MISCELLANEOUS) ×2 IMPLANT
CLOTH BEACON ORANGE TIMEOUT ST (SAFETY) ×2 IMPLANT
COVER SURGICAL LIGHT HANDLE (MISCELLANEOUS) ×2 IMPLANT
DRAPE LAPAROSCOPIC ABDOMINAL (DRAPES) ×2 IMPLANT
DRAPE UTILITY 15X26 W/TAPE STR (DRAPE) ×4 IMPLANT
DRAPE WARM FLUID 44X44 (DRAPE) ×2 IMPLANT
ELECT REM PT RETURN 9FT ADLT (ELECTROSURGICAL) ×2
ELECTRODE REM PT RTRN 9FT ADLT (ELECTROSURGICAL) ×1 IMPLANT
GLOVE BIO SURGEON STRL SZ8 (GLOVE) ×2 IMPLANT
GLOVE BIOGEL PI IND STRL 8 (GLOVE) ×1 IMPLANT
GLOVE BIOGEL PI INDICATOR 8 (GLOVE) ×1
GOWN STRL NON-REIN LRG LVL3 (GOWN DISPOSABLE) ×4 IMPLANT
KIT BASIN OR (CUSTOM PROCEDURE TRAY) ×2 IMPLANT
KIT ROOM TURNOVER OR (KITS) ×2 IMPLANT
LIGASURE IMPACT 36 18CM CVD LR (INSTRUMENTS) IMPLANT
NS IRRIG 1000ML POUR BTL (IV SOLUTION) ×2 IMPLANT
PACK GENERAL/GYN (CUSTOM PROCEDURE TRAY) ×2 IMPLANT
PAD ARMBOARD 7.5X6 YLW CONV (MISCELLANEOUS) ×4 IMPLANT
SPECIMEN JAR LARGE (MISCELLANEOUS) IMPLANT
SPONGE GAUZE 4X4 12PLY (GAUZE/BANDAGES/DRESSINGS) ×2 IMPLANT
SPONGE LAP 18X18 X RAY DECT (DISPOSABLE) IMPLANT
STAPLER VISISTAT 35W (STAPLE) ×2 IMPLANT
SUCTION POOLE TIP (SUCTIONS) IMPLANT
SUT PDS AB 1 CTX 36 (SUTURE) IMPLANT
SUT VIC AB 3-0 SH 18 (SUTURE) ×2 IMPLANT
SUT VICRYL AB 3 0 TIES (SUTURE) ×2 IMPLANT
TOWEL OR 17X24 6PK STRL BLUE (TOWEL DISPOSABLE) ×2 IMPLANT
TOWEL OR 17X26 10 PK STRL BLUE (TOWEL DISPOSABLE) ×2 IMPLANT
TRAY FOLEY CATH 14FRSI W/METER (CATHETERS) IMPLANT
WATER STERILE IRR 1000ML POUR (IV SOLUTION) ×2 IMPLANT
YANKAUER SUCT BULB TIP NO VENT (SUCTIONS) IMPLANT

## 2012-02-23 NOTE — Progress Notes (Signed)
19 Days Post-Op  Subjective: Sleepy.    Objective: Vital signs in last 24 hours: Temp:  [97.5 F (36.4 C)-98.4 F (36.9 C)] 97.7 F (36.5 C) (06/02 0639) Pulse Rate:  [89-115] 92  (06/02 0800) Resp:  [13-29] 25  (06/02 0800) BP: (115-164)/(70-99) 131/81 mmHg (06/02 0800) SpO2:  [98 %-100 %] 100 % (06/02 0800) Weight:  [166 lb 3.2 oz (75.388 kg)] 166 lb 3.2 oz (75.388 kg) (06/02 0400) Last BM Date: 02/22/12  Intake/Output from previous day: 06/01 0701 - 06/02 0700 In: 5703.8 [I.V.:2420; Blood:365; IV Piggyback:1538.8; TPN:1360] Out: 4240 [Urine:4120; Drains:70; Stool:50] Intake/Output this shift: Total I/O In: 420 [Blood:350; TPN:70] Out: -   Incision/Wound:RLQ ostomy pink with out blood.  Bilious drainage.  LLQ catheter with purulent drainage.  Wound intact .  Passing rectal bloody drainage  Lab Results:   Basename 02/23/12 0435 02/22/12 2115  WBC 15.4* 13.6*  HGB 6.7* 7.1*  HCT 18.3* 19.8*  PLT <5* <5*   BMET  Basename 02/23/12 0435 02/22/12 0635  NA 131* 139  K 4.4 3.6  CL 100 103  CO2 24 26  GLUCOSE 173* 114*  BUN 12 12  CREATININE 0.40* 0.63  CALCIUM 9.8 9.7   PT/INR  Basename 02/22/12 0635 02/22/12 0634  LABPROT 15.3* 17.5*  INR 1.18 1.41   ABG No results found for this basename: PHART:2,PCO2:2,PO2:2,HCO3:2 in the last 72 hours  Studies/Results: No results found.  Anti-infectives: Anti-infectives     Start     Dose/Rate Route Frequency Ordered Stop   02/20/12 1415   cefTRIAXone (ROCEPHIN) 1 g in dextrose 5 % 50 mL IVPB        1 g 100 mL/hr over 30 Minutes Intravenous Every 24 hours 02/20/12 1404     02/19/12 1800   ciprofloxacin (CIPRO) IVPB 400 mg  Status:  Discontinued        400 mg 200 mL/hr over 60 Minutes Intravenous Every 12 hours 02/19/12 1612 02/20/12 1404   02/19/12 1700   metroNIDAZOLE (FLAGYL) IVPB 500 mg        500 mg 100 mL/hr over 60 Minutes Intravenous 3 times per day 02/19/12 1612     02/12/12 0800    piperacillin-tazobactam (ZOSYN) IVPB 3.375 g  Status:  Discontinued        3.375 g 12.5 mL/hr over 240 Minutes Intravenous 3 times per day 02/12/12 0800 02/19/12 1612   02/10/12 1000   amoxicillin-clavulanate (AUGMENTIN) 875-125 MG per tablet 1 tablet  Status:  Discontinued        1 tablet Oral Every 12 hours 02/10/12 0823 02/12/12 0800   02/04/12 1800   cefOXitin (MEFOXIN) 1 g in dextrose 5 % 50 mL IVPB        1 g 100 mL/hr over 30 Minutes Intravenous Every 6 hours 02/04/12 1621 02/05/12 0617   02/04/12 0000   cefOXitin (MEFOXIN) 2 g in dextrose 5 % 50 mL IVPB        2 g 100 mL/hr over 30 Minutes Intravenous 60 min pre-op 02/03/12 0333 02/04/12 1145   02/03/12 0500   cefOXitin (MEFOXIN) 2 g in dextrose 5 % 50 mL IVPB  Status:  Discontinued        2 g 100 mL/hr over 30 Minutes Intravenous 60 min pre-op 02/02/12 1156 02/03/12 0333   02/03/12 0000   cefOXitin (MEFOXIN) 2 g in dextrose 5 % 50 mL IVPB  Status:  Discontinued        2 g 100 mL/hr over 30  Minutes Intravenous 60 min pre-op 02/02/12 1004 02/02/12 1156   01/27/12 2200   cefTRIAXone (ROCEPHIN) 1 g in dextrose 5 % 50 mL IVPB        1 g 100 mL/hr over 30 Minutes Intravenous Every 24 hours 01/27/12 2152 01/29/12 2359   01/27/12 1915   cefTRIAXone (ROCEPHIN) 1 g in dextrose 5 % 50 mL IVPB        1 g 100 mL/hr over 30 Minutes Intravenous  Once 01/27/12 1914 01/27/12 1957          Assessment/Plan: s/p Procedure(s) (LRB): COLON RESECTION SIGMOID (N/A) Severe thrombocytopenia  Appreciate hematology and CCM help.  Called blood bank and they have 3 pack platelets available and will have 5 Monday.  Will need to have plenty of products available at the time of exploration.  Best plan is to explore when 5 pack available during the week.   Pelvic abscesses  Will need reexploration in near future once better prepared for thrombocytopenia and probable need for significant amount of blood products.  Currently not requiring pressors but  if hemodynamics worsen may need more urgent exploration.  Unfortunate complex situation with high potential mortality secondary to complex coagulopathy.  Hopefully draining pelvis adequately will reverse the process but there is no guarantee if this will help and how fast the process will reverse.  May help to have pathologist review case as well. Continue  Supportive care for now and begin to collect adequate resources for exploration in next 24 hours or so as long as she remains stable. High  Risk for ICH.  Follow neuro exam closely.  LOS: 27 days    [name] A. 02/23/2012

## 2012-02-23 NOTE — Progress Notes (Signed)
Patient ID: Vanessa [name], female   DOB: 1976/04/03, 36 y.o.   MRN: [id number] Notes reviewed - appears that once thrombocytopenia is better controlled - to OR.  IR to follow on Monday post OR.  No new changes noted with drains.

## 2012-02-23 NOTE — Consult Note (Addendum)
Name: Vanessa [name] MRN: [id number] DOB: [birth year]    LOS: 27  Referring Provider:  CCS  Reason for Referral:  Thrombocytopenia    PULMONARY / CRITICAL CARE MEDICINE  HPI:  36 y/o F with PMH of HTN, [name] (last rad/chemo 2/12) who developed sigmoid stricture. admitted on 5/7 with persistent nausea & vomiting, intermittent BRBPR over 2 months prior to admit.  She was seen in ED on 5/5 and treated for UTI.  5/14 she underwent sigmoid colectomy with primary anastomosis and diverting ileostomy. She subsequently developed multiple intra-abdominal abscesses with concern for anastomotic leak on CT. On admit platelets wnl.  5/27 platelets began trending down and have been less than 5 since 5/29.  PCCM consulted for Thrombocytopenia / ICU assistance.     Past Medical History  Diagnosis Date  . Hypertension   . Cervical cancer    Past Surgical History  Procedure Date  . Cholecystectomy   . Radiation implants   . Colonoscopy 01/30/2012    Procedure: COLONOSCOPY;  Surgeon: Beverley Fiedler, MD;  Location: [facility] ENDOSCOPY;  Service: Gastroenterology;  Laterality: N/A;  . Colonoscopy 01/30/2012    Procedure: COLONOSCOPY;  Surgeon: Beverley Fiedler, MD;  Location: [facility] OR;  Service: Gastroenterology;  Laterality: N/A;  . Colostomy revision 02/04/2012    Procedure: COLON RESECTION SIGMOID;  Surgeon: Cherylynn Ridges, MD;  Location: [facility] OR;  Service: General;  Laterality: N/A;   Prior to Admission medications   Medication Sig Start Date End Date Taking? Authorizing Provider  hydrochlorothiazide (HYDRODIURIL) 12.5 MG tablet Take 12.5 mg by mouth daily.   Yes Historical Provider, MD   Allergies Allergies  Allergen Reactions  . Labetalol Hcl Itching and Other (See Comments)    Bumps to bilateral arms.  . Morphine And Related Hives    Family History History reviewed. No pertinent family history. Social History  reports that she has been smoking.  She does not have any smokeless  tobacco history on file. She reports that she does not drink alcohol or use illicit drugs.  Smoked 1/2ppd PTA.    Review Of Systems:   Gen: Denies fever, chills, weight change, fatigue, night sweats HEENT: Denies blurred vision, double vision, hearing loss, tinnitus, sinus congestion, rhinorrhea, sore throat, neck stiffness, dysphagia PULM: Denies shortness of breath, cough, sputum production, hemoptysis, wheezing CV: Denies chest pain, edema, orthopnea, paroxysmal nocturnal dyspnea, palpitations GI: Denies nausea, vomiting, diarrhea.  Indicates lower abd pain, bloody intermittent stools /hematochezia , constipation GU: Denies dysuria, hematuria, polyuria, oliguria, urethral discharge Endocrine: Denies hot or cold intolerance, polyuria, polyphagia or appetite change Derm: Denies rash, dry skin, scaling or peeling skin change Heme: Denies easy bruising, bleeding, bleeding gums Neuro: Denies headache, numbness, weakness, slurred speech, loss of memory or consciousness   Brief patient description:  36 y/o F with Hx of cervical cancer s/p chemo / radiation, now with abdominal adhesions / abscess and profound thrombocytopenia -not responsive to infusion.  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  Fixed sigmoid with sharp angulation 5/14 Colon resection with sigmoid / diverting loop ileostomy 5/23 Perc Drain by IR of abd abscesses 5/31 PICC line placed  Vital Signs: Temp:  [97.5 F (36.4 C)-98.4 F (36.9 C)] 97.7 F (36.5 C) (06/02 0639) Pulse Rate:  [89-115] 115  (06/02 0639) Resp:  [13-30] 18  (06/02 0639) BP: (115-164)/(70-99) 149/98 mmHg (06/02 0639) SpO2:  [98 %-100 %] 100 % (  06/02 0600) Weight:  [166 lb 3.2 oz (75.388 kg)] 166 lb 3.2 oz (75.388 kg) (06/02 0400)  Physical Examination: General:  Chronically ill, appears older than age Neuro:  AAOx4, flat affect, speech clear, MAE HEENT:  Mm  pink/moist Cardiovascular:  s1s2 rrr, no m/r/g Lungs:  resp's even/non-labored, lungs bilaterally clear Abdomen:  Round / soft, bsx4 active, RLQ ileostomy Musculoskeletal:  No obvious deformities Skin:  Warm/dry  Principal Problem:  *Sigmoid stricture Active Problems:  Cervical cancer  Post-radiation rectal bleeding  Pyelonephritis  Nausea & vomiting  Hypercalcemia  Radiation colitis  Hemorrhage of rectum and anus  Acute posthemorrhagic anemia  Thrombocytopenia   ASSESSMENT AND PLAN  PULMONARY No results found for this basename: PHART:5,PCO2:5,PCO2ART:5,PO2ART:5,HCO3:5,O2SAT:5 in the last 168 hours Ventilator Settings:   CXR:  none ETT:  none  A:   No acute issues P:   -supportive care, monitor respiratory status   CARDIOVASCULAR No results found for this basename: TROPONINI:5,LATICACIDVEN:5, O2SATVEN:5,PROBNP:5 in the last 168 hours ECG:   Lines:  RUE PICC 5/31>>>  A:  Hypertensive P:  -prn IV ACE inhibitor , she is allergic to beta blockers -ICU monitoring  RENAL  Lab 02/23/12 0435 02/22/12 0635 02/21/12 0853 02/20/12 0330  NA 131* 139 134* 126*  K 4.4 3.6 -- --  CL 100 103 98 89*  CO2 24 26 25 26   BUN 12 12 16 7   CREATININE 0.40* 0.63 0.82 0.46*  CALCIUM 9.8 9.7 9.5 9.7  MG 1.6 1.6 -- --  PHOS 2.0* 2.5 -- --   Intake/Output      06/01 0701 - 06/02 0700 06/02 0701 - 06/03 0700   I.V. (mL/kg) 2420 (32.1)    Blood 365    Other 20    IV Piggyback 1338.8    TPN 1290    Total Intake(mL/kg) 5433.8 (72.1)    Urine (mL/kg/hr) 4120 (2.3)    Drains 70    Stool 50    Total Output 4240    Net +1193.8          Foley:    A:   Hyponatremia Hypophosphatemia  P:   -TNA per Pharmacy  GASTROINTESTINAL  Lab 02/22/12 0635 02/20/12 0330  AST 19 16  ALT 17 19  ALKPHOS 77 103  BILITOT 0.8 0.9  PROT 6.1 6.5  ALBUMIN 2.6* 2.2*    A:   Abdominal Pain S/P sigmoid colectomy with primary anastomosis and diverting ileostomy. Rectal bleeding- large  amt of BRB mixed with large clots am of 6/2 with BM   P:   -PCA pain control -WOC following for ileostomy -Rec's per CCS -check HIV -medications reviewed-->on cephalosporin / abx's since profound platelet drop ?? Contributing?   HEMATOLOGIC  Lab 02/23/12 0435 02/22/12 2115 02/22/12 1632 02/22/12 0800 02/22/12 0635 02/22/12 0634 02/20/12 2356 02/20/12 1140 02/20/12 0330 02/19/12 1712  HGB 6.7* 7.1* 7.4* 5.1* 7.2* -- -- -- -- --  HCT 18.3* 19.8* 20.6* 14.9* 20.4* -- -- -- -- --  PLT <5* <5* <5* <5* <5* -- -- -- -- --  INR -- -- -- -- 1.18 1.41 1.26 1.21 1.25 --  APTT -- -- -- -- -- 33 -- 36 -- 40*   A:   Refractory Thrombocytopenia  I suspect d/t consumption.  Cannot r/o antibody induced drop. Agree we need to not give PLTs now until pt goes to the OR S/p chemo / radiation for cervical cancer.  Last treatment in 10/2010 Anemia -secondary to bleeding Family Hx of Sickle Cell Trait  P:  -Hematology Consult -Continue  IVIG, Romiplostim -change decadron to solumedrol  Discussed with Hematology who agrees -limit lab draws as able -PRBC>>tfx two more units -no plt transfusion until pt goes to OR    INFECTIOUS  Lab 02/23/12 0435 02/22/12 2115 02/22/12 1632 02/22/12 0800 02/22/12 0635  WBC 15.4* 13.6* 13.0* 11.9* 14.5*  PROCALCITON -- -- -- -- --   Cultures: UC 5/6>>>neg 5/13 MRSA PCR>>>neg 5/23 Abd abscess drain>>>multiple organisims 5/23 Abd drainage>>>few e-Coli 5/23 RU abd drain>>>rare viridans, abundant bacteroides fragilis, beta lactam positive  Antibiotics: Augmentin 5/20>>5/22 Cefoxitin 5/13>>>5/15 Rocephin 5/6>>>5/8, 5/30>>> Cipro 5/29>>>5/30 Flagyl 5/29>>> Zosyn 5/22>>>5/29  A:   Abdominal Abscess  P:   -abx as above   ENDOCRINE No results found for this basename: GLUCAP:5 in the last 168 hours A:   No acute issues P:   -monitor BMP glucose   NEUROLOGIC  A:   No acute issues  P:   -supportive care  BEST PRACTICE / DISPOSITION Level of  Care:  ICU Primary Service:  CCS Consultants:  PCCM, Hematology, GI Code Status:  Full Diet:  Clear DVT Px:  scd's GI Px:  pepcid Skin Integrity:  intact Social / Family:  None at bedside.  Patient has 8 children.      Canary Brim, NP-C [facility] Pgr: [phone] or [phone]   I have seen and examined this patient with the nurse practionner and agree with the above assessment and plan.    Shan Levans Beeper  [phone]  Cell  [phone]  If no response or cell goes to voicemail, call beeper [phone]   02/23/2012, 7:54 AM

## 2012-02-23 NOTE — Progress Notes (Signed)
PARENTERAL NUTRITION CONSULT NOTE - FOLLOW UP  Pharmacy Consult for TPN Indication: intraabdominal abscess, ileus  Allergies  Allergen Reactions  . Labetalol Hcl Itching and Other (See Comments)    Bumps to bilateral arms.  . Morphine And Related Hives    Patient Measurements: Height: 5\' 4"  (162.6 cm) Weight: 166 lb 3.2 oz (75.388 kg) IBW/kg (Calculated) : 54.7   Vital Signs: Temp: 97.7 F (36.5 C) (06/02 0639) Temp src: Oral (06/02 0639) BP: 149/98 mmHg (06/02 0639) Pulse Rate: 115  (06/02 0639) Intake/Output from previous day: 06/01 0701 - 06/02 0700 In: 5433.8 [I.V.:2420; Blood:365; IV Piggyback:1338.8; TPN:1290] Out: 4240 [Urine:4120; Drains:70; Stool:50] Intake/Output from this shift:    Labs:  Basename 02/23/12 0435 02/22/12 2115 02/22/12 1632 02/22/12 0635 02/22/12 0634 02/20/12 2356 02/20/12 1140  WBC 15.4* 13.6* 13.0* -- -- -- --  HGB 6.7* 7.1* 7.4* -- -- -- --  HCT 18.3* 19.8* 20.6* -- -- -- --  PLT <5* <5* <5* -- -- -- --  APTT -- -- -- -- 33 -- 36  INR -- -- -- 1.18 1.41 1.26 --     Basename 02/23/12 0435 02/22/12 0635 02/21/12 0853  NA 131* 139 134*  K 4.4 3.6 3.6  CL 100 103 98  CO2 24 26 25   GLUCOSE 173* 114* 95  BUN 12 12 16   CREATININE 0.40* 0.63 0.82  LABCREA -- -- --  CREAT24HRUR -- -- --  CALCIUM 9.8 9.7 9.5  MG 1.6 1.6 --  PHOS 2.0* 2.5 --  PROT -- 6.1 --  ALBUMIN -- 2.6* --  AST -- 19 --  ALT -- 17 --  ALKPHOS -- 77 --  BILITOT -- 0.8 --  BILIDIR -- -- --  IBILI -- -- --  PREALBUMIN -- 11.8* --  TRIG -- 91 --  CHOLHDL -- -- --  CHOL -- 79 --   Estimated Creatinine Clearance: 96.7 ml/min (by C-G formula based on Cr of 0.4).   No results found for this basename: GLUCAP:3 in the last 72 hours  Medications:  Scheduled:     . barrier cream  1 application Topical TID  . cefTRIAXone (ROCEPHIN)  IV  1 g Intravenous Q24H  . dexamethasone (DECADRON) IVPB  40 mg Intravenous Q24H  . famotidine (PEPCID) IV  20 mg Intravenous Q12H   . feeding supplement  237 mL Oral BID BM  . hydrochlorothiazide  12.5 mg Oral Daily  . HYDROmorphone PCA 0.3 mg/mL   Intravenous Q4H  . IMMUNE GLOBULIN 10% (HUMAN) IV - For Fluid Restriction Only  1 g/kg Intravenous Q24H  . insulin aspart  0-9 Units Subcutaneous Q4H  . magnesium sulfate 1 - 4 g bolus IVPB  2 g Intravenous Once  . metronidazole  500 mg Intravenous Q8H  . morphine  15 mg Oral QHS  . morphine  30 mg Oral Daily  . mulitivitamin with minerals  1 tablet Oral Daily  . potassium chloride  10 mEq Intravenous Q1 Hr x 4  . romiPLOStim  2 mcg/kg Subcutaneous Once  . sodium chloride  10-40 mL Intracatheter Q12H  . sodium chloride      . DISCONTD: gatorade (BH)  240 mL Oral QID  . DISCONTD: pantoprazole (PROTONIX) IV  40 mg Intravenous QHS   Infusions:     . fat emulsion 250 mL (02/21/12 2035)  . 0.9 % sodium chloride with kcl 120 mL/hr at 02/23/12 0600  . TPN (CLINIMIX) +/- additives 40 mL/hr at 02/21/12 2035  . TPN (  CLINIMIX) +/- additives Stopped (02/23/12 0705)  . DISCONTD: 0.9 % sodium chloride with kcl 150 mL/hr at 02/22/12 0100    Insulin Requirements in the past 24 hours:  4 units SSI with NO insulin in TNA with q4h sensitive SSI  Current Nutrition:  Clinimix E5/20 at 70 ml/hr (at goal)  Nutritional Goals:  1800-2000 kCal, 75-85 grams of protein per day Clinimix E5/20 goal rate of 16ml/hr will provide an average of 1684 kcal and 84g protein per day based on RD assessment (94% total kcal and 100% protein needs)  Assessment: 33 yof with a history of cervical cancer who is s/p sigmoid colectomy and diverting ileostomy on 5/14. Developed multiple intra-abdominal abscesses 2/2 anastomotic leak shown on CT and had 3 perc drains placed. On 5/29 patient had multiple blood BM, hemoptysis, and abdominal pain. Plts <5 (240k on 5/25). CT 5/30 showed additional abscesses and concern for fistula formation.   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 Anastomotic  breakdown leading to many intra-abdominal abscesses and concern for fistula on recent CT 5/30. Perc drains in place with 20ml/24hr drainage. Pt may need more drains placed but unable to take to surgery with low plt count. Pt with significant amount of hemoptysis/hematochezia. Unknown GI absorption. Spoke with Dr [name] -will change IV pantoprazole to IV famotidine (both have very small likelihood of thrombocytopenia, famotidine with slightly lower risk). Endo: No hx DM. CBG ok - one greater than goal of <150 - will follow. Lytes: Phos low, Na low, Mag low normal-despite 2g Mag IV yesterday, K improved- NSw/K infusion at 119ml/hr.  Renal: SCr 0.46 (5/29) --> 0.63. UOP improved Pulm: RA Cards: BP slightly elevated on Hctz only  Hepatobil: Alk phos/LFTs/bili wnl from labs on 5/20. Albumin low at 1.8.  Triglycerides, cholesterol noted. Neuro: standing oxycontin and dilaudid PCA  Heme/Onc: Hx of cervical cancer, plts 5/25 at 240 and now <5 this am after multiple plt transfusions. Hgb 7.2 - heme/onc consulted. 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 ID: wbc 15.4, afebrile on empiric flagyl, ceftriaxone   Rocephin 5/5 >> 5/8; 5/30>> Cefoxitin 5/14 >> 5/15 Augmentin 5/20 >> 5/21 Zosyn 5/22 >> 5/29 Cipro 5/29 >>5/30 Flagyl 5/29 >>  Micro: 5/23 RUQ Abscess: rare viridans strep; B. Fragilis (no sens) 5/23 LLQ Abscess: few E.coli (pan sens) 5/23 LUQ Abscess: multiple bacteria 5/14 MRSA PCR neg 5/6 Urine: neg  Best Practices: SCDs, IV PPI  Plan:  -Increase Clinimix E 5/20 to goal of 70 ml/hr -Adjust IVF for TNA rate -Replete phos with NaPhos, give Mag 2g IV x1 -TE, lipids at 10 ml/hr MWF only 2/2 national shortage -On PO MVI 2/2 national shortage of IV MVI -f/u am TNA labs, CBGs  Caige Almeda L. Illene Bolus, PharmD, BCPS Clinical Pharmacist Pager: [phone] 02/23/2012 8:14 AM

## 2012-02-23 NOTE — Progress Notes (Signed)
We have notified blood bank to have five(5) single donor bags of platelets available  By tomorrow a.m., in case we proceed to OR tomorrow.  They state they will have these ready.    ICU nursing also aware.   Angelia Mould. Derrell Lolling, M.D., [facility] General and Minimally invasive Surgery Breast and Colorectal Surgery Office:   [phone] Pager:   [phone]

## 2012-02-23 NOTE — Progress Notes (Signed)
SUBJECTIVE: Pt seen - Drowsy.  Profuse bleeding from rectum. Reviewed hematology panel -   Smear - no evidence of schitocytes, H/H 6.7/1.3, haptoglobin 154, INR 1.58, PT 15.3, Fibrinogen 410.    MEDICATIONS:  Scheduled:   . barrier cream  1 application Topical TID  . cefTRIAXone (ROCEPHIN)  IV  1 g Intravenous Q24H  . dexamethasone (DECADRON) IVPB  40 mg Intravenous Q24H  . famotidine (PEPCID) IV  20 mg Intravenous Q12H  . hydrochlorothiazide  12.5 mg Oral Daily  . HYDROmorphone PCA 0.3 mg/mL   Intravenous Q4H  . IMMUNE GLOBULIN 10% (HUMAN) IV - For Fluid Restriction Only  1 g/kg Intravenous Q24H  . insulin aspart  0-9 Units Subcutaneous Q4H  . magnesium sulfate 1 - 4 g bolus IVPB  2 g Intravenous Once  . magnesium sulfate 1 - 4 g bolus IVPB  2 g Intravenous Once  . metronidazole  500 mg Intravenous Q8H  . morphine  15 mg Oral QHS  . morphine  30 mg Oral Daily  . mulitivitamin with minerals  1 tablet Oral Daily  . potassium chloride  10 mEq Intravenous Q1 Hr x 4  . sodium chloride  10-40 mL Intracatheter Q12H  . sodium phosphate  Dextrose 5% IVPB  10 mmol Intravenous Once  . DISCONTD: feeding supplement  237 mL Oral BID BM   Continuous:   . fat emulsion 250 mL (02/21/12 2035)  . 0.9 % sodium chloride with kcl    . TPN (CLINIMIX) +/- additives 40 mL/hr at 02/21/12 2035  . TPN [facility]) +/- additives Stopped (02/23/12 0705)  . TPN (CLINIMIX) +/- additives    . DISCONTD: 0.9 % sodium chloride with kcl 120 mL/hr at 02/23/12 0800   YNW:GNFAOZHYQMVHQ, alum & mag hydroxide-simeth, diphenhydrAMINE, diphenhydrAMINE, enalaprilat, HYDROmorphone (DILAUDID) injection, naloxone, ondansetron (ZOFRAN) IV, ondansetron (ZOFRAN) IV, ondansetron, oxyCODONE-acetaminophen, simethicone, sodium chloride, sodium chloride, zolpidem   PHYSICAL EXAM  Vital signs in last 24 hours: Temp:  [97.5 F (36.4 C)-98.4 F (36.9 C)] 97.7 F (36.5 C) (06/02 0639) Pulse Rate:  [89-115] 92  (06/02 0800) Resp:   [13-28] 25  (06/02 0800) BP: (115-164)/(70-99) 131/81 mmHg (06/02 0800) SpO2:  [98 %-100 %] 100 % (06/02 0800) Weight:  [166 lb 3.2 oz (75.388 kg)] 166 lb 3.2 oz (75.388 kg) (06/02 0400)  Intake/Output from previous day: 06/01 0701 - 06/02 0700 In: 5703.8 [I.V.:2420; Blood:365; IV Piggyback:1538.8; TPN:1360] Out: 4240 [Urine:4120; Drains:70; Stool:50] Intake/Output this shift: Total I/O In: 420 [Blood:350; TPN:70] Out: -   General appearance: drowsy. Resp: clear to auscultation bilaterally Cardio: regular rate and rhythm, S1, S2 normal, no murmur, click, rub or gallop GI: soft, Distended. Drains evident. Bowel sounds present but distant; no masses,  no organomegaly. Colostomy with bilious drainage. Extremities: extremities normal, atraumatic, no cyanosis or edema   LABS:  CBC    Component Value Date/Time   WBC 15.4* 02/23/2012 0435   WBC 5.3 10/22/2010 1258   RBC 2.12* 02/23/2012 0435   RBC 3.48* 10/22/2010 1258   HGB 6.7* 02/23/2012 0435   HGB 11.2* 10/22/2010 1258   HCT 18.3* 02/23/2012 0435   HCT 33.1* 10/22/2010 1258   PLT <5* 02/23/2012 0435   PLT 287 10/22/2010 1258   MCV 86.3 02/23/2012 0435   MCV 94.9 10/22/2010 1258   MCH 31.6 02/23/2012 0435   MCH 29.1 09/19/2010 0847   MCHC 36.6* 02/23/2012 0435   MCHC 33.8 10/22/2010 1258   RDW 15.6* 02/23/2012 0435   RDW 25.0* 10/22/2010 1258  LYMPHSABS 2.2 02/22/2012 0635   LYMPHSABS 0.5* 10/22/2010 1258   MONOABS 1.3* 02/22/2012 0635   MONOABS 0.6 10/22/2010 1258   EOSABS 0.1 02/22/2012 0635   EOSABS 0.1 10/22/2010 1258   BASOSABS 0.1 02/22/2012 0635   BASOSABS 0.0 10/22/2010 1258     Basename 02/23/12 0435 02/22/12 0635  NA 131* 139  K 4.4 3.6  CL 100 103  CO2 24 26  GLUCOSE 173* 114*  BUN 12 12  CREATININE 0.40* 0.63  CALCIUM 9.8 9.7     ASSESSMENT and PLAN: 1. Refractory thrombocytopenia with rectal bleeding - likely consumptive but would give second dose of IVIG today, continue with Amicar, begin solumedrol 60 mg iv q 12 hrs.  Transfuse platelets as needed. 2. Anemia from rectal bleeding - Transfuse PRBC as needed.  Available for questions.   Arlan Organ I., MD 02/23/2012

## 2012-02-24 DIAGNOSIS — D500 Iron deficiency anemia secondary to blood loss (chronic): Secondary | ICD-10-CM

## 2012-02-24 DIAGNOSIS — K651 Peritoneal abscess: Secondary | ICD-10-CM

## 2012-02-24 LAB — GLUCOSE, CAPILLARY
Glucose-Capillary: 121 mg/dL — ABNORMAL HIGH (ref 70–99)
Glucose-Capillary: 124 mg/dL — ABNORMAL HIGH (ref 70–99)
Glucose-Capillary: 126 mg/dL — ABNORMAL HIGH (ref 70–99)
Glucose-Capillary: 128 mg/dL — ABNORMAL HIGH (ref 70–99)
Glucose-Capillary: 134 mg/dL — ABNORMAL HIGH (ref 70–99)
Glucose-Capillary: 134 mg/dL — ABNORMAL HIGH (ref 70–99)
Glucose-Capillary: 146 mg/dL — ABNORMAL HIGH (ref 70–99)
Glucose-Capillary: 149 mg/dL — ABNORMAL HIGH (ref 70–99)
Glucose-Capillary: 155 mg/dL — ABNORMAL HIGH (ref 70–99)
Glucose-Capillary: 155 mg/dL — ABNORMAL HIGH (ref 70–99)
Glucose-Capillary: 90 mg/dL (ref 70–99)
Glucose-Capillary: 143 mg/dL — ABNORMAL HIGH (ref 70–99)

## 2012-02-24 LAB — CBC
HCT: 14.4 % — ABNORMAL LOW (ref 36.0–46.0)
Hemoglobin: 5.1 g/dL — CL (ref 12.0–15.0)
MCH: 31.3 pg (ref 26.0–34.0)
MCHC: 35.4 g/dL (ref 30.0–36.0)
MCV: 88.3 fL (ref 78.0–100.0)
MCV: 86.1 fL (ref 78.0–100.0)
Platelets: <5 10*3/uL — CL (ref 150–400)
Platelets: <5 10*3/uL — CL (ref 150–400)
Platelets: <5 10*3/uL — CL (ref 150–400)
RBC: 2.37 MIL/uL — ABNORMAL LOW (ref 3.87–5.11)
RBC: 1.63 MIL/uL — ABNORMAL LOW (ref 3.87–5.11)
RBC: 2.45 MIL/uL — ABNORMAL LOW (ref 3.87–5.11)
RDW: 15.9 % — ABNORMAL HIGH (ref 11.5–15.5)
RDW: 14.9 % (ref 11.5–15.5)
WBC: 18.3 10*3/uL — ABNORMAL HIGH (ref 4.0–10.5)
WBC: 28.1 10*3/uL — ABNORMAL HIGH (ref 4.0–10.5)
WBC: 21.2 10*3/uL — ABNORMAL HIGH (ref 4.0–10.5)

## 2012-02-24 LAB — DIFFERENTIAL
Basophils Absolute: 0.2 10*3/uL — ABNORMAL HIGH (ref 0.0–0.1)
Lymphocytes Relative: 15 % (ref 12–46)
Lymphs Abs: 2.7 10*3/uL (ref 0.7–4.0)
Monocytes Relative: 10 % (ref 3–12)
Neutrophils Relative %: 74 % (ref 43–77)
WBC Morphology: INCREASED

## 2012-02-24 LAB — COMPREHENSIVE METABOLIC PANEL
CO2: 23 mEq/L (ref 19–32)
Calcium: 10.1 mg/dL (ref 8.4–10.5)
Creatinine, Ser: 0.36 mg/dL — ABNORMAL LOW (ref 0.50–1.10)
GFR calc Af Amer: >90 mL/min (ref >90)
GFR calc non Af Amer: >90 mL/min (ref >90)
Glucose, Bld: 132 mg/dL — ABNORMAL HIGH (ref 70–99)
Total Protein: 7.3 g/dL (ref 6.0–8.3)

## 2012-02-24 LAB — ANTI-NUCLEAR AB-TITER (ANA TITER): ANA Titer 1: 1:40 {titer} — ABNORMAL HIGH

## 2012-02-24 LAB — PREPARE RBC (CROSSMATCH)

## 2012-02-24 LAB — PREALBUMIN: Prealbumin: 17.5 mg/dL — ABNORMAL LOW (ref 17.0–34.0)

## 2012-02-24 LAB — HEPARIN INDUCED THROMBOCYTOPENIA PNL
UFH High Dose UFH H: 0 % Release
UFH Low Dose 0.1 IU/mL: 0 % Release
UFH Low Dose 0.5 IU/mL: 0 % Release

## 2012-02-24 LAB — PHOSPHORUS: Phosphorus: 2.3 mg/dL (ref 2.3–4.6)

## 2012-02-24 LAB — CHOLESTEROL, TOTAL: Cholesterol: 72 mg/dL (ref 0–200)

## 2012-02-24 LAB — ANA: Anti Nuclear Antibody(ANA): POSITIVE — AB

## 2012-02-24 MED ORDER — MAGNESIUM SULFATE 40 MG/ML IJ SOLN
4.0000 g | Freq: Once | INTRAMUSCULAR | Status: AC
  Administered 2012-02-24: 4 g via INTRAVENOUS
  Filled 2012-02-24: qty 100

## 2012-02-24 MED ORDER — OXYMETAZOLINE HCL 0.05 % NA SOLN
2.0000 | Freq: Two times a day (BID) | NASAL | Status: DC
  Administered 2012-02-24 – 2012-02-25 (×3): 2 via NASAL
  Filled 2012-02-24: qty 15

## 2012-02-24 MED ORDER — FOLIC ACID 5 MG/ML IJ SOLN
1.0000 mg | Freq: Every day | INTRAMUSCULAR | Status: DC
  Administered 2012-02-24 – 2012-03-04 (×10): 1 mg via INTRAVENOUS
  Filled 2012-02-24 (×11): qty 0.2

## 2012-02-24 MED ORDER — TRANEXAMIC ACID 100 MG/ML IV SOLN
INTRAVENOUS | Status: AC
  Administered 2012-02-24: 12:00:00 via INTRAVENOUS
  Filled 2012-02-24 (×2): qty 100

## 2012-02-24 MED ORDER — LORAZEPAM 2 MG/ML IJ SOLN
0.5000 mg | INTRAMUSCULAR | Status: DC | PRN
  Administered 2012-02-24: 0.5 mg via INTRAVENOUS
  Filled 2012-02-24: qty 1

## 2012-02-24 MED ORDER — MAGNESIUM SULFATE 40 MG/ML IJ SOLN
4.0000 g | Freq: Once | INTRAMUSCULAR | Status: DC
  Filled 2012-02-24: qty 100

## 2012-02-24 MED ORDER — ZINC TRACE METAL 1 MG/ML IV SOLN
INTRAVENOUS | Status: AC
  Administered 2012-02-24: 17:00:00 via INTRAVENOUS
  Filled 2012-02-24: qty 2000

## 2012-02-24 MED ORDER — LORAZEPAM 2 MG/ML IJ SOLN
0.5000 mg | Freq: Four times a day (QID) | INTRAMUSCULAR | Status: DC | PRN
  Administered 2012-02-25 – 2012-03-11 (×12): 1 mg via INTRAVENOUS
  Filled 2012-02-24 (×13): qty 1

## 2012-02-24 MED ORDER — FAT EMULSION 20 % IV EMUL
250.0000 mL | INTRAVENOUS | Status: AC
  Administered 2012-02-24: 250 mL via INTRAVENOUS
  Filled 2012-02-24: qty 250

## 2012-02-24 MED ORDER — TRANEXAMIC ACID 100 MG/ML IV SOLN
750.0000 mg | Freq: Once | INTRAVENOUS | Status: AC
  Administered 2012-02-24: 750 mg via INTRAVENOUS
  Filled 2012-02-24: qty 7.5

## 2012-02-24 MED ORDER — LORAZEPAM 2 MG/ML IJ SOLN
1.0000 mg | Freq: Once | INTRAMUSCULAR | Status: AC
  Administered 2012-02-24: 1 mg via INTRAVENOUS
  Filled 2012-02-24: qty 1

## 2012-02-24 MED ORDER — TRANEXAMIC ACID 100 MG/ML IV SOLN: 2.0000 mg/kg/h | Freq: Once | INTRAVENOUS | Status: DC

## 2012-02-24 MED ORDER — LEVOFLOXACIN IN D5W 750 MG/150ML IV SOLN
750.0000 mg | INTRAVENOUS | Status: DC
  Administered 2012-02-24 – 2012-02-25 (×2): 750 mg via INTRAVENOUS
  Filled 2012-02-24 (×3): qty 150

## 2012-02-24 NOTE — Progress Notes (Signed)
Medical Oncology  See full note this AM also.  Patient seen now with RNs present, PRBCs being hung for hgb down to 5.1 on cbc from 1530, plt still <5. She had tranexamic acid bolus then 2 hr infusion beginning 0930. She has had 2 bloody liquid stools from rectum, early AM and ~ 1600. Left nares was packed by Dr Suszanne Conners this am and seems to be bleeding less in past several hours. Patient able to sleep after ativan 1mg  x1; pain mostly controlled with dilaudid PCA. Appreciate Dr Moshe Cipro consult; Rocephin DCd and levaquin added to flagyl, as cephalosporin seems possible cause of the thrombocytopenia. I was able to discuss case with Dr Clemetine Marker in heme/coag at [facility]. She agrees that this is most consistent with drug induced thrombocytopenia. She was in agreement with all interventions thus far and in process, and mentioned that oral tranxemic acid 650 mg /d might be considered (can cause nausea) and suggests increase in Nplate dose to 5-6 mcg/kg or even 10 mcg/kg if we need to repeat this in 1 week. She also recommends supplemental folate, order written now. She does not feel there is a reason to try to avoid further platelet transfusions prior to surgery. She does not feel that phoresis should be attempted additionally now. We discussed bone marrow exam, but doubt this would change management possible now. She was concerned that we have not been able to tell any improvement thus far from the IVIG.  On exam: rouses easily to voice, alert and appropriate responses, much more calm now. Gauze reinforcing the packing in left nares is not saturated with blood. Dried blood in mouth/ across teeth. Lungs clear anteriorly. PICC RUE functioning well. Cor tachy, regular. Abdomen quiet. No change JPs/ileostomy. LE no pitting edema, cords, tenderness. Petechiae on UE > LE. Moves all extremities easily. Feet warm. No family here presently.  A/P: 1.Acute, profound thrombocytopenia most likely drug-induced: agree  with stopping cephalosporin now (tho obviously multiple potential agents). Bleeding seems to have slowed a little since tranxemic acid. If bleeding increases tonight, would transfuse platelets again. 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 3.History of cervical cancer, not known recurrent since completion of primary RT with sensitizing chemotherapy Nov 2011 thru Jan2012.  Please call if we can be of help prior to my next rounds in AM.  Jama Flavors, MD [phone]

## 2012-02-24 NOTE — Procedures (Deleted)
Central Venous Catheter Insertion Procedure Note Vanessa [name] [id number] [birth year]  Procedure: Insertion of Central Venous Catheter Indications: Assessment of intravascular volume and Drug and/or fluid administration  Procedure Details Consent: Risks of procedure as well as the alternatives and risks of each were explained to the (patient/caregiver).  Consent for procedure obtained. Time Out: Verified patient identification, verified procedure, site/side was marked, verified correct patient position, special equipment/implants available, medications/allergies/relevent history reviewed, required imaging and test results available.  Performed  Maximum sterile technique was used including antiseptics, cap, gloves, gown, hand hygiene and mask. Skin prep: Chlorhexidine; local anesthetic administered A antimicrobial bonded/coated triple lumen catheter was placed in the right subclavian vein using the Seldinger technique.  Evaluation Blood flow good Complications: No apparent complications Patient did tolerate procedure well. Chest X-ray ordered to verify placement.  CXR: pending.  Vanessa [name] S. 02/24/2012, 3:17 PM

## 2012-02-24 NOTE — Progress Notes (Signed)
CRITICAL VALUE ALERT  Critical value received: HGB 5.1  Date of notification: 02/24/12 Time of notification: 1543  Critical value read back:yes  Nurse who received alert:  Burnard Bunting, RN MD notified (1st page):  Dr Delton Coombes  Time of first page:  1543  MD notified (2nd page):  Time of second page:  Responding MD:  Dr Delton Coombes Time MD responded:  [phone]

## 2012-02-24 NOTE — Progress Notes (Signed)
  02/24/2012, 8:48 AM  Hospital day: 29 Antibiotics: Rocephin, Flagyl Solumedrol begun 5-31pm, 60 q 12 hr IVIG given 6-1 and 6-2 Nplate given 6-1 No amicar available system-wide/ nationally unavailable  Discussed with Dr Derrell Lolling on unit, with pharmacist by phone and on unit, reviewed peripheral smear from this am.  Subjective: Patient seen with mother at bedside and 2 RNs present. Awake, alert, crying, wants to eat. Pain control seems good with PCA. Epistaxis this am, patient removing gauze frequently. Discussed leaving gauze packing in place, discussed TNA ongoing. Seems more emotional with steroids.  Objective: Vital signs in last 24 hours: Blood pressure 143/77, pulse 98, temperature 98 F (36.7 C), temperature source Oral, resp. rate 22, height 5\' 4"  (1.626 m), weight 166 lb 3.2 oz (75.388 kg), SpO2 100.00%. Oriented to place and situation. Moves in bed with minimal assistance. PERRL, not icteric. Saturated gauze in left nares. Mouth moist without bleeding from gums, lips dry. Neck supple without JVD, No supraclavicular nodes. Lungs without rales or wheezes. Cor RRR. Abdomen not more distended than at my last exam, quiet. Minimal blood in JP tubes, no oozing at incision with staples still in. LE no edema, cords. Minimal oozing from rectum now. Skin on lower buttocks better with barrier cream. Moves all extremities. Feet warm. No bleeding at lines.  Intake/Output from previous day: 06/02 0701 - 06/03 0700 In: 5986 [I.V.:1904; Blood:900; IV Piggyback:752; TPN:1680] Out: 4865 [Urine:4800; Drains:35; Stool:30] Intake/Output this shift:    Lab Results:  Basename 02/24/12 0500 02/23/12 2200  WBC 18.3* 17.8*  HGB 7.7* 8.6*  HCT 21.1* 23.8*  PLT <5* <5*  Smear from this AM reviewed now: No platelets seen on multiple fields. No schistocytes. BMET  Basename 02/24/12 0500 02/23/12 0435  NA 131* 131*  K 4.2 4.4  CL 100 100  CO2 23 24  GLUCOSE 132* 173*  BUN 19 12  CREATININE 0.36*  0.40*  CALCIUM 10.1 9.8   LDH 162 Platelet antibodies to be set up today, 2-4 day turnaround ANA pending HIT pending from last week. Studies/Results: Spoke with radiologist by phone now, who reviewed most recent CT AP. Spleen is slightly small, certainly not enlarged.   I have spoken with Dr Suszanne Conners by phone now, on call for ENT. I have written Afrin nose spray, but may need packing.   Discussed tranxemic acid with pharmacists, as this is being used off label as substitute for amicar in heart patients.  Discussed with my partner:thrombocytopenia  seems most likely drug-related, with multiple possible agents including presentRocephin,previouscefoxitin/zosyn/protonix/pepcid/lovenox/augmentin/lasix/tordol etc. I have spoken with Dr Ninetta Lights with ID ?any recommendations for change in antibiotic coverage, particularly the cephalosporin but other suggestions if any also. I have LM with [facility] to speak with coag MD there. I have updated Dr Derrell Lolling by phone now. Assessment/Plan 1. Acute, profound, persistent thrombocytopenia: Drug-related seems most likely still. Not overtly septic or overt DIC, not TTP, not clearly ITP and has not improved with those interventions tho has been only ~ 48 hours with these, not likely HIT without any clotting, even if alloimmunized to platelets would not have caused the acute drop. Tranxemic acid being started now, peak effect ~ 3 hrs.  If still bleeding after that, I will consider slow, continuous platelet transfusion. 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 3.No evidence of cancer on path/at surgery 4.blood loss anemia 5.on TNA Zineb Glade P

## 2012-02-24 NOTE — Progress Notes (Signed)
Name: AVIANNAH CASTORO MRN: [id number] DOB: [birth year]    LOS: 28  Referring Provider:  [facility]  Reason for Referral:  Thrombocytopenia    PULMONARY / CRITICAL CARE MEDICINE  Brief patient description:  36 y/o F with Hx of cervical cancer s/p chemo / radiation (2012), now with abdominal adhesions / abscess and profound thrombocytopenia -not responsive to infusion.  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  Fixed sigmoid with sharp angulation 5/14 Colon resection with sigmoid / diverting loop ileostomy 5/23 Perc Drain by IR of abd abscesses 5/31 PICC line placed  Subjective/Interval events:  Remains stable but critical Received PRBC 6/2 Still receiving IVIg  Vital Signs: Temp:  [97.6 F (36.4 C)-98.9 F (37.2 C)] 98 F (36.7 C) (06/03 0800) Pulse Rate:  [82-110] 98  (06/03 0700) Resp:  [12-28] 22  (06/03 0700) BP: (139-163)/(77-114) 143/77 mmHg (06/03 0700) SpO2:  [100 %] 100 % (06/03 0700) FiO2 (%):  [100 %] 100 % (06/02 1417)  Physical Examination: General:  Chronically ill, appears older than age Neuro:  AAOx4, flat affect, no focal deficits HEENT: dry MM, bleeding from nose Cardiovascular:  s1s2 rrr, no m/r/g Lungs:  resp's even/non-labored, lungs bilaterally clear Abdomen:  Round / soft, tender,  3 JP drains, RLQ ileostomy Musculoskeletal:  No obvious deformities Skin:  Warm/dry  Principal Problem:  *Sigmoid stricture Active Problems:  Cervical cancer  Post-radiation rectal bleeding  Pyelonephritis  Nausea & vomiting  Hypercalcemia  Radiation colitis  Hemorrhage of rectum and anus  Acute posthemorrhagic anemia  Thrombocytopenia   ASSESSMENT AND PLAN  PULMONARY No results found for this basename: PHART:5,PCO2:5,PCO2ART:5,PO2ART:5,HCO3:5,O2SAT:5 in the last 168 hours Ventilator Settings: Vent Mode:  [-]  FiO2 (%):  [100 %] 100 % CXR:  none ETT:  none  A:   No acute  issues P:   -supportive care, monitor respiratory status   CARDIOVASCULAR No results found for this basename: TROPONINI:5,LATICACIDVEN:5, O2SATVEN:5,PROBNP:5 in the last 168 hours ECG:   Lines:  RUE PICC 5/31>>>  A:  Hypertensive P:  -prn IV ACE inhibitor , she is allergic to beta blockers -ICU monitoring  RENAL  Lab 02/24/12 0500 02/23/12 0435 02/22/12 0635 02/21/12 0853 02/20/12 0330  NA 131* 131* 139 134* 126*  K 4.2 4.4 -- -- --  CL 100 100 103 98 89*  CO2 23 24 26 25 26   BUN 19 12 12 16 7   CREATININE 0.36* 0.40* 0.63 0.82 0.46*  CALCIUM 10.1 9.8 9.7 9.5 9.7  MG 1.5 1.6 1.6 -- --  PHOS 2.3 2.0* 2.5 -- --   Intake/Output      06/02 0701 - 06/03 0700 06/03 0701 - 06/04 0700   I.V. (mL/kg) 1904 (25.3)    Blood 900    Other 750    IV Piggyback 752    TPN 1680    Total Intake(mL/kg) 5986 (79.4)    Urine (mL/kg/hr) 4800 (2.7)    Drains 35    Stool 30    Total Output 4865    Net +1121         Urine Occurrence 7 x    Stool Occurrence 1 x     Foley:    A:   Hyponatremia Hypophosphatemia  P:   -TNA per Pharmacy  GASTROINTESTINAL  Lab 02/24/12 0500 02/22/12 0635 02/20/12 0330  AST 16 19 16   ALT 13 17 19   ALKPHOS 51 77 103  BILITOT 0.9 0.8 0.9  PROT 7.3 6.1 6.5  ALBUMIN 2.0* 2.6* 2.2*    A:   Abdominal Pain. S/P sigmoid colectomy with primary anastomosis and diverting ileostomy. 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 -WOC for ileostomy  Rectal bleeding- large amt of BRB mixed with large clots am of 6/2 with BM  P:   -continue to monitor -f/u CBC   HEMATOLOGIC  Lab 02/24/12 0500 02/23/12 2200 02/23/12 1450 02/23/12 0435 02/22/12 2115 02/22/12 0635 02/22/12 0634 02/20/12 2356 02/20/12 1140 02/20/12 0330 02/19/12 1712  HGB 7.7* 8.6* 5.5* 6.7* 7.1* -- -- -- -- -- --  HCT 21.1* 23.8* 16.1* 18.3* 19.8* -- -- -- -- -- --  PLT <5* <5* <5* <5* <5*  -- -- -- -- -- --  INR -- -- -- -- -- 1.18 1.41 1.26 1.21 1.25 --  APTT -- -- -- -- -- -- 33 -- 36 -- 40*   Hematology panel 6/1: no schitocytes, haptoglogin 154, fibrinogen 410  A:   Refractory Thrombocytopenia, suspect d/t consumption.  Cannot r/o antibody induced drop. S/p chemo / radiation for cervical cancer.  Last treatment in 10/2010 P: -f/u hematology recs -continue solumedrol, IVIg as ordered -hold plt transfusion until going to OR -continue cyklokapron staretd 6/3 am (Amicar not available)  Anemia -secondary to bleeding P: -f/u CBC -transfuse for HGB <7 -limit lab draws   INFECTIOUS  Lab 02/24/12 0500 02/23/12 2200 02/23/12 1450 02/23/12 0435 02/22/12 2115  WBC 18.3* 17.8* 16.3* 15.4* 13.6*  PROCALCITON -- -- -- -- --   Cultures: UC 5/6>>>neg 5/13 MRSA PCR>>>neg 5/23 Abd abscess drain>>>multiple organisims 5/23 Abd drainage>>>few e-Coli 5/23 RU abd drain>>>rare viridans, abundant bacteroides fragilis, beta lactam positive 6/2 HIV>>> neg  Antibiotics: Augmentin 5/20>>5/22 Cefoxitin 5/13>>>5/15 Rocephin 5/6>>>5/8, 5/30>>> Cipro 5/29>>>5/30 Flagyl 5/29>>> Zosyn 5/22>>>5/29  A:   Abdominal Abscess  P:   -abx as above -per CCS when to go to OR  ENDOCRINE No results found for this basename: GLUCAP:5 in the last 168 hours A:   Hyperglycemia- well controled  P:   -monitor CBG glucose -Continue SSI  NEUROLOGIC  A:   No acute issues  P:   -supportive care  BEST PRACTICE / DISPOSITION Level of Care:  ICU Primary Service:  CCS Consultants:  PCCM, Hematology, GI Code Status:  Full Diet:  Clear DVT Px:  scd's GI Px:  pepcid Skin Integrity:  intact Social / Family:  None at bedside.  Patient has 8 children.    Levy Pupa, MD, PhD 02/24/2012, 10:37 AM  Pulmonary and Critical Care [phone] or if no answer [phone]

## 2012-02-24 NOTE — Progress Notes (Addendum)
PARENTERAL NUTRITION CONSULT NOTE - FOLLOW UP  Pharmacy Consult for TPN Indication: intraabdominal abscess, ileus  Allergies  Allergen Reactions  . Labetalol Hcl Itching and Other (See Comments)    Bumps to bilateral arms.  . Morphine And Related Hives    Patient Measurements: Height: 5\' 4"  (162.6 cm) Weight: 166 lb 3.2 oz (75.388 kg) IBW/kg (Calculated) : 54.7   Vital Signs: Temp: 98 F (36.7 C) (06/03 0800) Temp src: Oral (06/03 0800) BP: 143/77 mmHg (06/03 0700) Pulse Rate: 98  (06/03 0700) Intake/Output from previous day: 06/02 0701 - 06/03 0700 In: 5986 [I.V.:1904; Blood:900; IV Piggyback:752; TPN:1680] Out: 4865 [Urine:4800; Drains:35; Stool:30] Intake/Output from this shift:    Labs:  Basename 02/24/12 0500 02/23/12 2200 02/23/12 1450 02/22/12 0635 02/22/12 0634  WBC 18.3* 17.8* 16.3* -- --  HGB 7.7* 8.6* 5.5* -- --  HCT 21.1* 23.8* 16.1* -- --  PLT <5* <5* <5* -- --  APTT -- -- -- -- 33  INR -- -- -- 1.18 1.41     Basename 02/24/12 0500 02/23/12 0435 02/22/12 0635  NA 131* 131* 139  K 4.2 4.4 3.6  CL 100 100 103  CO2 23 24 26   GLUCOSE 132* 173* 114*  BUN 19 12 12   CREATININE 0.36* 0.40* 0.63  LABCREA -- -- --  CREAT24HRUR -- -- --  CALCIUM 10.1 9.8 9.7  MG 1.5 1.6 1.6  PHOS 2.3 2.0* 2.5  PROT 7.3 -- 6.1  ALBUMIN 2.0* -- 2.6*  AST 16 -- 19  ALT 13 -- 17  ALKPHOS 51 -- 77  BILITOT 0.9 -- 0.8  BILIDIR -- -- --  IBILI -- -- --  PREALBUMIN -- -- 11.8*  TRIG 126 -- 91  CHOLHDL -- -- --  CHOL 72 -- 79   Estimated Creatinine Clearance: 96.7 ml/min (by C-G formula based on Cr of 0.36).   No results found for this basename: GLUCAP:3 in the last 72 hours  Medications:  Scheduled:     . alteplase  2 mg Intracatheter Once  . alteplase  2 mg Intracatheter Once  . barrier cream  1 application Topical TID  . cefTRIAXone (ROCEPHIN)  IV  1 g Intravenous Q24H  . famotidine (PEPCID) IV  20 mg Intravenous Q12H  . hydrochlorothiazide  12.5 mg Oral  Daily  . HYDROmorphone PCA 0.3 mg/mL   Intravenous Q4H  . IMMUNE GLOBULIN 10% (HUMAN) IV - For Fluid Restriction Only  1 g/kg Intravenous Q24H  . insulin aspart  0-9 Units Subcutaneous Q4H  . magnesium sulfate 1 - 4 g bolus IVPB  2 g Intravenous Once  . methylPREDNISolone (SOLU-MEDROL) injection  60 mg Intravenous Q12H  . metronidazole  500 mg Intravenous Q8H  . morphine  15 mg Oral QHS  . morphine  30 mg Oral Daily  . mulitivitamin with minerals  1 tablet Oral Daily  . oxymetazoline  2 spray Each Nare BID  . sodium chloride  10-40 mL Intracatheter Q12H  . sodium phosphate  Dextrose 5% IVPB  10 mmol Intravenous Once  . DISCONTD: dexamethasone (DECADRON) IVPB  40 mg Intravenous Q24H  . DISCONTD: mulitivitamin with minerals  1 tablet Oral Daily   Infusions:     . 0.9 % sodium chloride with kcl 80 mL/hr at 02/24/12 0345  . TPN [facility]) +/- additives Stopped (02/23/12 0705)  . TPN (CLINIMIX) +/- additives 70 mL/hr at 02/23/12 2000    Insulin Requirements in the past 24 hours:  5 units SSI with NO insulin  in TNA with q4h sensitive SSI  Current Nutrition:  Clinimix E5/20 at 70 ml/hr (at goal)  Nutritional Goals:  1800-2000 kCal, 75-85 grams of protein per day Clinimix E5/20 goal rate of 30ml/hr will provide an average of 1684 kcal and 84g protein per day based on RD assessment (94% total kcal and 100% protein needs)  Assessment: 71 yof with a history of cervical cancer who is s/p sigmoid colectomy and diverting ileostomy on 5/14. Developed multiple intra-abdominal abscesses 2/2 anastomotic leak shown on CT and had 3 perc drains placed. On 5/29 patient had multiple blood BM, hemoptysis, and abdominal pain. Plts <5 (240k on 5/25). CT 5/30 showed additional abscesses and concern for fistula formation.   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 Anastomotic breakdown leading to many intra-abdominal abscesses and concern for fistula on recent CT 5/30. Perc drains in place with  17ml/24hr drainage. Pt may need more drains placed but unable to take to surgery with low plt count. Pt with significant amount of hemoptysis/hematochezia. Unknown GI absorption. Spoke with Dr [name] -will change IV pantoprazole to IV famotidine (both have very small likelihood of thrombocytopenia, famotidine with slightly lower risk). Endo: No hx DM. CBG adequately controlled; also on solumedrol 60 q12h Lytes: Phos improved s/p bolus 6/2, Na low - unchanged, Mag continues to be less than goal of >2 - despite repeat Mag bolus', K at goal w/ IVF NSk40 at 30ml/hr.  Renal: SCr WNL, UOP 2.16ml/kg/hr Pulm: RA Cards: BP slightly elevated, on Hctz po with prn enalaprilat (allergy to BB) Hepatobil: Alk phos/LFTs/bili wnl from labs on 5/20. Albumin remains low at 2.  Triglycerides slightly increased, will follow.  Cholesterol noted. Neuro: standing po morphine BID and PRN IV dilaudid  Heme/Onc: Hx of cervical cancer, plts 5/25 at 240 and now <5 this am after multiple plt transfusions. Heme/onc consulted - suspect consumption.  6/3: orders for tranexamic acid load and infusion, blood bank prepared with platelets if/when OR is needed. ID: WBC increased to 18.3 today, afebrile on empiric flagyl, ceftriaxone; s/p IVIG x 2 doses - 6/1, 6/2  Rocephin 5/5 >> 5/8; 5/30>> Cefoxitin 5/14 >> 5/15 Augmentin 5/20 >> 5/21 Zosyn 5/22 >> 5/29 Cipro 5/29 >>5/30 Flagyl 5/29 >>  Micro: 5/23 RUQ Abscess: rare viridans strep; B. Fragilis (no sens) 5/23 LLQ Abscess: few E.coli (pan sens) 5/23 LUQ Abscess: multiple bacteria 5/14 MRSA PCR neg 5/6 Urine: neg  Best Practices: SCDs, IV famotidine  Plan:  -Continue Clinimix E 5/20 to goal of 70 ml/hr -Replete Mag with 4 g IV x1 -Add famotidine to TNA -TE, lipids at 10 ml/hr MWF only 2/2 national shortage -On PO MVI 2/2 national shortage of IV MVI -f/u am Mag, BMP, CBGs -f/u pending prealbumin  Jill Side L. Illene Bolus, PharmD, BCPS Clinical Pharmacist Pager:  [phone] 02/24/2012 8:59 AM

## 2012-02-24 NOTE — Progress Notes (Signed)
UR Completed.  Kyasia Steuck Jane [phone] 02/24/2012  

## 2012-02-24 NOTE — Progress Notes (Signed)
20 Days Post-Op  Subjective: Hemodynamically stable. Alert. States pain is no worse and perhaps a little better the last 2 days. Afebrile but on steroids. Oxygen saturation 100% on room air. Creatinine and BUN normal, urine output excellent.  Platelet count still less than 5000. Received blood transfusion last night. In hemoglobin up to 8.6, drifting down to 7.7. WBC 18,000.  Patient still having some bleeding from the nose and from rectum.    Objective: Vital signs in last 24 hours: Temp:  [97.6 F (36.4 C)-98.9 F (37.2 C)] 98 F (36.7 C) (06/03 0400) Pulse Rate:  [82-110] 98  (06/03 0700) Resp:  [12-28] 22  (06/03 0700) BP: (139-163)/(77-114) 143/77 mmHg (06/03 0700) SpO2:  [100 %] 100 % (06/03 0700) FiO2 (%):  [100 %] 100 % (06/02 1417) Last BM Date: 02/23/12  Intake/Output from previous day: 06/02 0701 - 06/03 0700 In: 5986 [I.V.:1904; Blood:900; IV Piggyback:752; TPN:1680] Out: 4865 [Urine:4800; Drains:35; Stool:30] Intake/Output this shift:    General appearance: alert. Mental status normal. Conversant. Minimal distress. Careful. Mother in room. Skin warm and dry. Resp: clear to auscultation bilaterally GI: abdomen is soft and slightly distended. Lower midline wound closed in upper portion packed open lower. Drainage smells feculent.  Lab Results:  Results for orders placed during the hospital encounter of 01/27/12 (from the past 24 hour(s))  PREPARE RBC (CROSSMATCH)     Status: Normal   Collection Time   02/23/12 10:30 AM      Component Value Range   Order Confirmation ORDER PROCESSED BY BLOOD BANK    CBC     Status: Abnormal   Collection Time   02/23/12  2:50 PM      Component Value Range   WBC 16.3 (*) 4.0 - 10.5 (K/uL)   RBC 1.85 (*) 3.87 - 5.11 (MIL/uL)   Hemoglobin 5.5 (*) 12.0 - 15.0 (g/dL)   HCT 40.9 (*) 81.1 - 46.0 (%)   MCV 87.0  78.0 - 100.0 (fL)   MCH 29.7  26.0 - 34.0 (pg)   MCHC 34.2  30.0 - 36.0 (g/dL)   RDW 91.4  78.2 - 95.6 (%)   Platelets <5  (*) 150 - 400 (K/uL)  HIV ANTIBODY (ROUTINE TESTING)     Status: Normal   Collection Time   02/23/12  2:50 PM      Component Value Range   HIV NON REACTIVE  NON REACTIVE   CBC     Status: Abnormal   Collection Time   02/23/12 10:00 PM      Component Value Range   WBC 17.8 (*) 4.0 - 10.5 (K/uL)   RBC 2.72 (*) 3.87 - 5.11 (MIL/uL)   Hemoglobin 8.6 (*) 12.0 - 15.0 (g/dL)   HCT 21.3 (*) 08.6 - 46.0 (%)   MCV 87.5  78.0 - 100.0 (fL)   MCH 31.6  26.0 - 34.0 (pg)   MCHC 36.1 (*) 30.0 - 36.0 (g/dL)   RDW 57.8  46.9 - 62.9 (%)   Platelets <5 (*) 150 - 400 (K/uL)  COMPREHENSIVE METABOLIC PANEL     Status: Abnormal   Collection Time   02/24/12  5:00 AM      Component Value Range   Sodium 131 (*) 135 - 145 (mEq/L)   Potassium 4.2  3.5 - 5.1 (mEq/L)   Chloride 100  96 - 112 (mEq/L)   CO2 23  19 - 32 (mEq/L)   Glucose, Bld 132 (*) 70 - 99 (mg/dL)   BUN 19  6 -  23 (mg/dL)   Creatinine, Ser 0.98 (*) 0.50 - 1.10 (mg/dL)   Calcium 11.9  8.4 - 10.5 (mg/dL)   Total Protein 7.3  6.0 - 8.3 (g/dL)   Albumin 2.0 (*) 3.5 - 5.2 (g/dL)   AST 16  0 - 37 (U/L)   ALT 13  0 - 35 (U/L)   Alkaline Phosphatase 51  39 - 117 (U/L)   Total Bilirubin 0.9  0.3 - 1.2 (mg/dL)   GFR calc non Af Amer >90  >90 (mL/min)   GFR calc Af Amer >90  >90 (mL/min)  MAGNESIUM     Status: Normal   Collection Time   02/24/12  5:00 AM      Component Value Range   Magnesium 1.5  1.5 - 2.5 (mg/dL)  PHOSPHORUS     Status: Normal   Collection Time   02/24/12  5:00 AM      Component Value Range   Phosphorus 2.3  2.3 - 4.6 (mg/dL)  CBC     Status: Abnormal   Collection Time   02/24/12  5:00 AM      Component Value Range   WBC 18.3 (*) 4.0 - 10.5 (K/uL)   RBC 2.37 (*) 3.87 - 5.11 (MIL/uL)   Hemoglobin 7.7 (*) 12.0 - 15.0 (g/dL)   HCT 14.7 (*) 82.9 - 46.0 (%)   MCV 89.0  78.0 - 100.0 (fL)   MCH 32.5  26.0 - 34.0 (pg)   MCHC 36.5 (*) 30.0 - 36.0 (g/dL)   RDW 56.2 (*) 13.0 - 15.5 (%)   Platelets <5 (*) 150 - 400 (K/uL)  DIFFERENTIAL      Status: Abnormal   Collection Time   02/24/12  5:00 AM      Component Value Range   Neutrophils Relative 74  43 - 77 (%)   Lymphocytes Relative 15  12 - 46 (%)   Monocytes Relative 10  3 - 12 (%)   Eosinophils Relative 0  0 - 5 (%)   Basophils Relative 1  0 - 1 (%)   Neutro Abs 13.6 (*) 1.7 - 7.7 (K/uL)   Lymphs Abs 2.7  0.7 - 4.0 (K/uL)   Monocytes Absolute 1.8 (*) 0.1 - 1.0 (K/uL)   Eosinophils Absolute 0.0  0.0 - 0.7 (K/uL)   Basophils Absolute 0.2 (*) 0.0 - 0.1 (K/uL)   RBC Morphology POLYCHROMASIA PRESENT     WBC Morphology INCREASED BANDS (>20% BANDS)    CHOLESTEROL, TOTAL     Status: Normal   Collection Time   02/24/12  5:00 AM      Component Value Range   Cholesterol 72  0 - 200 (mg/dL)  TRIGLYCERIDES     Status: Normal   Collection Time   02/24/12  5:00 AM      Component Value Range   Triglycerides 126  <150 (mg/dL)     Studies/Results: @RISRSLT24 @     . alteplase  2 mg Intracatheter Once  . alteplase  2 mg Intracatheter Once  . barrier cream  1 application Topical TID  . cefTRIAXone (ROCEPHIN)  IV  1 g Intravenous Q24H  . famotidine (PEPCID) IV  20 mg Intravenous Q12H  . hydrochlorothiazide  12.5 mg Oral Daily  . HYDROmorphone PCA 0.3 mg/mL   Intravenous Q4H  . IMMUNE GLOBULIN 10% (HUMAN) IV - For Fluid Restriction Only  1 g/kg Intravenous Q24H  . insulin aspart  0-9 Units Subcutaneous Q4H  . magnesium sulfate 1 - 4 g bolus IVPB  2 g Intravenous Once  . methylPREDNISolone (SOLU-MEDROL) injection  60 mg Intravenous Q12H  . metronidazole  500 mg Intravenous Q8H  . morphine  15 mg Oral QHS  . morphine  30 mg Oral Daily  . mulitivitamin with minerals  1 tablet Oral Daily  . sodium chloride  10-40 mL Intracatheter Q12H  . sodium phosphate  Dextrose 5% IVPB  10 mmol Intravenous Once  . DISCONTD: dexamethasone (DECADRON) IVPB  40 mg Intravenous Q24H  . DISCONTD: feeding supplement  237 mL Oral BID BM  . DISCONTD: mulitivitamin with minerals  1 tablet Oral Daily       Assessment/Plan: s/p Procedure(s): COLON RESECTION SIGMOID  Severe thrombocytopenia persists. Etiology is still in question. I have called blood bank and they have plenty of platelets and plenty of packed red blood cells available.  Patient is stable and is not septic, nor is there any evidence of organ failure. She will need a laparotomy and lysis of adhesions, washout, and colostomy. This will be a fairly rough operation and bleeding could be problematic and even excessive. Mortality rates high in this setting, at least 50%.  Appreciate hematology and CCM help.  . I met with Dr. Darrold Span later this morning, and she is going to contemplate further management of the thrombocytopenia. If this is correctable, then we should hold off on surgery  In the short term.   If she feels that this will not correct without surgical management of her infection that we may be forced to go ahead with a laparotomy, which is very high risk.  We'll continue close followup.  High risk for intracranial hemorrhage. Follow neuro exam closely.  Patient and mother aware of gravity of situation.     LOS: 28 days    Andren Bethea M. Derrell Lolling, M.D., [facility] General and Minimally invasive Surgery Breast and Colorectal Surgery Office:   [phone] Pager:   [phone]  02/24/2012  . .prob

## 2012-02-24 NOTE — Progress Notes (Addendum)
MEDICATION RELATED CONSULT NOTE - INITIAL   Pharmacy Consult for Tranexamic acid Indication: Bleeding, thrombocytopenia, pending OR  Allergies  Allergen Reactions  . Labetalol Hcl Itching and Other (See Comments)    Bumps to bilateral arms.  . Morphine And Related Hives    Patient Measurements: Height: 5\' 4"  (162.6 cm) Weight: 166 lb 3.2 oz (75.388 kg) IBW/kg (Calculated) : 54.7   Vital Signs: Temp: 98 F (36.7 C) (06/03 0800) Temp src: Oral (06/03 0800) BP: 144/100 mmHg (06/03 0900) Pulse Rate: 103  (06/03 0900) Intake/Output from previous day: 06/02 0701 - 06/03 0700 In: 5986 [I.V.:1904; Blood:900; IV Piggyback:752; TPN:1680] Out: 4865 [Urine:4800; Drains:35; Stool:30] Intake/Output from this shift: Total I/O In: 515 [I.V.:172.5; IV Piggyback:132.5; TPN:210] Out: -   Labs:  Basename 02/24/12 0500 02/23/12 2200 02/23/12 1450 02/23/12 0435 02/22/12 0635 02/22/12 0634  WBC 18.3* 17.8* 16.3* -- -- --  HGB 7.7* 8.6* 5.5* -- -- --  HCT 21.1* 23.8* 16.1* -- -- --  PLT <5* <5* <5* -- -- --  APTT -- -- -- -- -- 33  CREATININE 0.36* -- -- 0.40* 0.63 --  LABCREA -- -- -- -- -- --  CREATININE 0.36* -- -- 0.40* 0.63 --  CREAT24HRUR -- -- -- -- -- --  MG 1.5 -- -- 1.6 1.6 --  PHOS 2.3 -- -- 2.0* 2.5 --  ALBUMIN 2.0* -- -- -- 2.6* --  PROT 7.3 -- -- -- 6.1 --  ALBUMIN 2.0* -- -- -- 2.6* --  AST 16 -- -- -- 19 --  ALT 13 -- -- -- 17 --  ALKPHOS 51 -- -- -- 77 --  BILITOT 0.9 -- -- -- 0.8 --  BILIDIR -- -- -- -- -- --  IBILI -- -- -- -- -- --   Estimated Creatinine Clearance: 96.7 ml/min (by C-G formula based on Cr of 0.36).   Microbiology: Recent Results (from the past 720 hour(s))  URINE CULTURE     Status: Normal   Collection Time   01/26/12  8:39 PM      Component Value Range Status Comment   Specimen Description URINE, RANDOM   Final    Special Requests NONE   Final    Culture  Setup Time [id number]   Final    Colony Count 50,000 COLONIES/ML   Final    Culture     Final    Value: Multiple bacterial morphotypes present, none predominant. Suggest appropriate recollection if clinically indicated.   Report Status 01/28/2012 FINAL   Final   URINE CULTURE     Status: Normal   Collection Time   01/27/12 11:54 PM      Component Value Range Status Comment   Specimen Description URINE, RANDOM   Final    Special Requests NONE   Final    Culture  Setup Time [id number]   Final    Colony Count NO GROWTH   Final    Culture NO GROWTH   Final    Report Status 01/29/2012 FINAL   Final   SURGICAL PCR SCREEN     Status: Normal   Collection Time   02/03/12  2:28 AM      Component Value Range Status Comment   MRSA, PCR NEGATIVE  NEGATIVE  Final    Staphylococcus aureus NEGATIVE  NEGATIVE  Final   SURGICAL PCR SCREEN     Status: Normal   Collection Time   02/04/12  6:26 AM      Component Value Range Status Comment  MRSA, PCR NEGATIVE  NEGATIVE  Final    Staphylococcus aureus NEGATIVE  NEGATIVE  Final   CULTURE, ROUTINE-ABSCESS     Status: Normal   Collection Time   02/13/12  6:33 PM      Component Value Range Status Comment   Specimen Description ABSCESS DRAINAGE ABDOMEN LEFT LOWER   Final    Special Requests DRAIN NO1   Final    Gram Stain     Final    Value: ABUNDANT WBC PRESENT,BOTH PMN AND MONONUCLEAR     NO SQUAMOUS EPITHELIAL CELLS SEEN     ABUNDANT GRAM POSITIVE COCCI IN PAIRS     ABUNDANT GRAM POSITIVE RODS     ABUNDANT GRAM NEGATIVE RODS   Culture FEW ESCHERICHIA COLI   Final    Report Status 02/17/2012 FINAL   Final    Organism ID, Bacteria ESCHERICHIA COLI   Final   CULTURE, ROUTINE-ABSCESS     Status: Normal   Collection Time   02/13/12  7:00 PM      Component Value Range Status Comment   Specimen Description ABSCESS ABDOMEN DRAINAGE LEFT UPPER   Final    Special Requests DRAIN NO 2   Final    Gram Stain     Final    Value: ABUNDANT WBC PRESENT,BOTH PMN AND MONONUCLEAR     NO SQUAMOUS EPITHELIAL CELLS SEEN     MODERATE GRAM  POSITIVE COCCI IN PAIRS AND CHAINS     ABUNDANT GRAM NEGATIVE RODS   Culture     Final    Value: MULTIPLE ORGANISMS PRESENT, NONE PREDOMINANT     Note: NO STAPHYLOCOCCUS AUREUS ISOLATED NO GROUP A STREP (S.PYOGENES) ISOLATED   Report Status 02/17/2012 FINAL   Final   CULTURE, ROUTINE-ABSCESS     Status: Normal   Collection Time   02/13/12  7:00 PM      Component Value Range Status Comment   Specimen Description ABSCESS ABDOMEN DRAINAGE RIGHT UPPER   Final    Special Requests DRAIN NO 3   Final    Gram Stain     Final    Value: ABUNDANT WBC PRESENT,BOTH PMN AND MONONUCLEAR     NO SQUAMOUS EPITHELIAL CELLS SEEN     ABUNDANT GRAM POSITIVE COCCI IN PAIRS     ABUNDANT GRAM NEGATIVE RODS   Culture     Final    Value: RARE VIRIDANS STREPTOCOCCUS     ABUNDANT BACTEROIDES FRAGILIS     Note: BETA LACTAMASE POSITIVE   Report Status 02/17/2012 FINAL   Final     Medical History: Past Medical History  Diagnosis Date  . Hypertension   . Cervical cancer     Medications:  Prescriptions prior to admission  Medication Sig Dispense Refill  . hydrochlorothiazide (HYDRODIURIL) 12.5 MG tablet Take 12.5 mg by mouth daily.      Marland Kitchen ibuprofen (ADVIL,MOTRIN) 800 MG tablet Take 800 mg by mouth 3 (three) times daily.      . ondansetron (ZOFRAN) 4 MG tablet Take 4 mg by mouth every 6 (six) hours as needed. For nausea.      Marland Kitchen oxyCODONE-acetaminophen (PERCOCET) 5-325 MG per tablet Take 1-2 tablets by mouth every 6 (six) hours as needed. For pain.      Marland Kitchen DISCONTD: cephALEXin (KEFLEX) 500 MG capsule Take 500 mg by mouth 4 (four) times daily. Started today and ends on the 16th.      . DISCONTD: morphine (MS CONTIN) 15 MG 12 hr tablet Take 15-30 mg  by mouth 2 (two) times daily. 15 mg every morning and 30 mg at night        Assessment: 36 y.o. 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 Pt now has severe thrombocytopenia (plt < 5k) of  unknown etiology and rectal bleeding.  Pt is s/p blood product transfusions (5/29: 1 unit, 5/30: 3 units, 5/31: 4 units, 6/1: 1 unit), PRBC (5/29:2 units, 5/31: 2 units, 6/1: 2 units, 6/2: 3 units) and FFP (5/30: 2 units, 6/1: 2 units) over the past few days.   Plan:  1. Tranexamic acid 10mg /kg IV load over 30 minutes then infuse 2mg /kg/hr x 2 hours 2. D/w Dr. Darrold Span, tranexamic acid has a peak onset of ~3h and half life of ~ 2-11 hours. Onset of antifibrinolytic action is expected at ~ 3 hours and expected to last ~ 2 days at the most. 3. Will f/up patient's clinical status and labs along with you daily.  Allena Katz, Daymon Hora K 02/24/2012,11:37 AM

## 2012-02-24 NOTE — Consult Note (Signed)
Date of Admission:  01/27/2012  Date of Consult:  02/24/2012  Reason for Consult: Abdominal Abscesses Referring Physician: Darrold Span  Impression/Recommendation Abdominal Abscesses Thrombocytopenia Would stop ceftriaxone Start levaquin, continue flagyl Suspect that her plt drop is from beta-lactams.  Comment- levofloxacin will give good coverage for e coli, strep, some anaerobic activity. Flagyl will clearly cover her bacteroides. Although both of these meds can cause low platelets (both very rare), I suspect that this is due to her previous drugs.  Unfortunately, still not able to go for debridement.  Will follow with you thanks   Vanessa [name] is an 36 y.o. female.  HPI: 36 yo F with hx of cervical CA, XRT (Nov 2011 thru Jan 2012). She was adm 5-7 with n/v and abdominal pain. She was found on CT to have and abnormality of her sigmoid colon. 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   By 5-23 she was found to have multiple intra-abdominal abscesses which were felt to be due to anastomotic leak. She underwent ID drains x 3 (5-23).  She underwent repeat CT 5-29 with <<Persistent pneumoperitoneum is again worrisome for anastomotic breakdown. The largest abscesses within both lower quadrants have decreased in size after drainage tube placement; however, there is a new dominant abscess in the left anterior pararenal space. Additional smaller abscesses also remain present throughout the abdomen. Tracking of gas in the subumbilical soft tissues has increased and there is adjacent intraperitoneal gas which is worrisome for fistula formation.>>  She also developed bloody stools with clots on 5-29 as well (has developed thrombocytopenia in hospital). Has received PLT transfusion but had no response in PLT count. She was then started on IVIg and steroids with no response to date. Has had increasing WBC since 6-1.  Augmentin 5/20>>5/22  Cefoxitin  5/13>>>5/15  Rocephin 5/6>>>5/8, 5/30>>>  Cipro 5/29>>>5/30  Flagyl 5/29>>>  Zosyn 5/22>>>5/29 Solumedrol 5/31>> IVIg 6/1, 6/2 Nplate 6/1 tranxemic acid 6/3>>  5/23 L upper Abd abscess drain>>>multiple organisims  5/23 L lower Abd drainage>>>few e-Coli  5/23 RU abd drain>>>rare viridans, abundant bacteroides fragilis, beta lactam positive  6/2 HIV>>> neg   Past Medical History  Diagnosis Date  . Hypertension   . Cervical cancer     Past Surgical History  Procedure Date  . Cholecystectomy   . Radiation implants   . Colonoscopy 01/30/2012    Procedure: COLONOSCOPY;  Surgeon: Beverley Fiedler, MD;  Location: [facility] ENDOSCOPY;  Service: Gastroenterology;  Laterality: N/A;  . Colonoscopy 01/30/2012    Procedure: COLONOSCOPY;  Surgeon: Beverley Fiedler, MD;  Location: [facility] OR;  Service: Gastroenterology;  Laterality: N/A;  . Colostomy revision 02/04/2012    Procedure: COLON RESECTION SIGMOID;  Surgeon: Cherylynn Ridges, MD;  Location: [facility] OR;  Service: General;  Laterality: N/A;  ergies:   Allergies  Allergen Reactions  . Labetalol Hcl Itching and Other (See Comments)    Bumps to bilateral arms.  . Morphine And Related Hives    Medications:  Scheduled:   . alteplase  2 mg Intracatheter Once  . alteplase  2 mg Intracatheter Once  . barrier cream  1 application Topical TID  . cefTRIAXone (ROCEPHIN)  IV  1 g Intravenous Q24H  . hydrochlorothiazide  12.5 mg Oral Daily  . HYDROmorphone PCA 0.3 mg/mL   Intravenous Q4H  . insulin aspart  0-9 Units Subcutaneous Q4H  . LORazepam  1 mg Intravenous Once  . magnesium sulfate 1 - 4 g bolus IVPB  4 g Intravenous Once  . methylPREDNISolone (SOLU-MEDROL) injection  60 mg Intravenous Q12H  . metronidazole  500 mg Intravenous Q8H  . morphine  15 mg Oral QHS  . morphine  30 mg Oral Daily  . mulitivitamin with minerals  1 tablet Oral Daily  . oxymetazoline  2 spray Each Nare BID  . sodium chloride 0.9 % 100 mL with tranexamic acid (CYKLOLAPRON) 300 mg  infusion   Intravenous Q1H  . sodium chloride  10-40 mL Intracatheter Q12H  . sodium phosphate  Dextrose 5% IVPB  10 mmol Intravenous Once  . tranexamic acid (CYLOKAPRON) IVPB (ORTHO)  750 mg Intravenous Once  . DISCONTD: famotidine (PEPCID) IV  20 mg Intravenous Q12H  . DISCONTD: magnesium sulfate 1 - 4 g bolus IVPB  4 g Intravenous Once  . DISCONTD: mulitivitamin with minerals  1 tablet Oral Daily  . DISCONTD: tranexamic acid (CYKLOKAPRON) infusion ([facility])  2 mg/kg/hr (Order-Specific) Intravenous Once    Social History:  reports that she has been smoking.  She does not have any smokeless tobacco history on file. She reports that she does not drink alcohol or use illicit drugs.  History reviewed. No pertinent family history.  General ROS: c/o epistaxis, denies abd pain, denies cough, c/o hunger. See hpi.   Blood pressure 97/39, pulse 139, temperature 97.4 F (36.3 C), temperature source Oral, resp. rate 30, height 5\' 4"  (1.626 m), weight 75.388 kg (166 lb 3.2 oz), SpO2 100.00%. General appearance: alert, cooperative and mild distress Eyes: negative findings: pupils equal, round, reactive to light and accomodation and no injection Nose: Nares normal. Septum midline. Mucosa normal. No drainage or sinus tenderness., blood/packing in L nares.  Throat: blood, dried, in mouth.  Neck: no adenopathy and no-tender Lungs: clear to auscultation bilaterally Heart: regular rate and rhythm Abdomen: normal findings: bowel sounds normal and soft, non-tender and drains present, dressed.  Extremities: edema none Neurologic: Sensory: normal, normal light touch   Results for orders placed during the hospital encounter of 01/27/12 (from the past 48 hour(s))  GLUCOSE, CAPILLARY     Status: Abnormal   Collection Time   02/22/12  3:23 PM      Component Value Range Comment   Glucose-Capillary 146 (*) 70 - 99 (mg/dL)   CBC     Status: Abnormal   Collection Time   02/22/12  4:32 PM      Component Value Range  Comment   WBC 13.0 (*) 4.0 - 10.5 (K/uL)    RBC 2.38 (*) 3.87 - 5.11 (MIL/uL)    Hemoglobin 7.4 (*) 12.0 - 15.0 (g/dL) POST TRANSFUSION SPECIMEN   HCT 20.6 (*) 36.0 - 46.0 (%)    MCV 86.6  78.0 - 100.0 (fL)    MCH 31.1  26.0 - 34.0 (pg)    MCHC 35.9  30.0 - 36.0 (g/dL)    RDW 13.0  86.5 - 78.4 (%)    Platelets <5 (*) 150 - 400 (K/uL) CRITICAL VALUE NOTED.  VALUE IS CONSISTENT WITH PREVIOUSLY REPORTED AND CALLED VALUE.  GLUCOSE, CAPILLARY     Status: Abnormal   Collection Time   02/22/12  8:38 PM      Component Value Range Comment   Glucose-Capillary 134 (*) 70 - 99 (mg/dL)   CBC     Status: Abnormal   Collection Time   02/22/12  9:15 PM      Component Value Range Comment   WBC 13.6 (*) 4.0 - 10.5 (K/uL)    RBC 2.27 (*)  3.87 - 5.11 (MIL/uL)    Hemoglobin 7.1 (*) 12.0 - 15.0 (g/dL)    HCT 16.1 (*) 09.6 - 46.0 (%)    MCV 87.2  78.0 - 100.0 (fL)    MCH 31.3  26.0 - 34.0 (pg)    MCHC 35.9  30.0 - 36.0 (g/dL)    RDW 04.5 (*) 40.9 - 15.5 (%)    Platelets <5 (*) 150 - 400 (K/uL) CRITICAL VALUE NOTED.  VALUE IS CONSISTENT WITH PREVIOUSLY REPORTED AND CALLED VALUE.  BASIC METABOLIC PANEL     Status: Abnormal   Collection Time   02/23/12  4:35 AM      Component Value Range Comment   Sodium 131 (*) 135 - 145 (mEq/L)    Potassium 4.4  3.5 - 5.1 (mEq/L)    Chloride 100  96 - 112 (mEq/L)    CO2 24  19 - 32 (mEq/L)    Glucose, Bld 173 (*) 70 - 99 (mg/dL)    BUN 12  6 - 23 (mg/dL)    Creatinine, Ser 8.11 (*) 0.50 - 1.10 (mg/dL) DELTA CHECK NOTED   Calcium 9.8  8.4 - 10.5 (mg/dL)    GFR calc non Af Amer >90  >90 (mL/min)    GFR calc Af Amer >90  >90 (mL/min)   MAGNESIUM     Status: Normal   Collection Time   02/23/12  4:35 AM      Component Value Range Comment   Magnesium 1.6  1.5 - 2.5 (mg/dL)   PHOSPHORUS     Status: Abnormal   Collection Time   02/23/12  4:35 AM      Component Value Range Comment   Phosphorus 2.0 (*) 2.3 - 4.6 (mg/dL)   CBC     Status: Abnormal   Collection Time   02/23/12   4:35 AM      Component Value Range Comment   WBC 15.4 (*) 4.0 - 10.5 (K/uL)    RBC 2.12 (*) 3.87 - 5.11 (MIL/uL)    Hemoglobin 6.7 (*) 12.0 - 15.0 (g/dL)    HCT 91.4 (*) 78.2 - 46.0 (%)    MCV 86.3  78.0 - 100.0 (fL)    MCH 31.6  26.0 - 34.0 (pg)    MCHC 36.6 (*) 30.0 - 36.0 (g/dL)    RDW 95.6 (*) 21.3 - 15.5 (%)    Platelets <5 (*) 150 - 400 (K/uL) CRITICAL VALUE NOTED.  VALUE IS CONSISTENT WITH PREVIOUSLY REPORTED AND CALLED VALUE.  PREPARE RBC (CROSSMATCH)     Status: Normal   Collection Time   02/23/12  5:25 AM      Component Value Range Comment   Order Confirmation ORDER PROCESSED BY BLOOD BANK     TYPE AND SCREEN     Status: Normal (Preliminary result)   Collection Time   02/23/12  5:25 AM      Component Value Range Comment   ABO/RH(D) A POS      Antibody Screen NEG      Sample Expiration 02/26/2012      Unit Number [id number]      Blood Component Type RED CELLS,LR      Unit division 00      Status of Unit ISSUED,FINAL      Transfusion Status OK TO TRANSFUSE      Crossmatch Result Compatible      Unit Number [id number]      Blood Component Type RED CELLS,LR      Unit division 00  Status of Unit ALLOCATED      Transfusion Status OK TO TRANSFUSE      Crossmatch Result Compatible      Unit Number [id number]      Blood Component Type RED CELLS,LR      Unit division 00      Status of Unit ISSUED,FINAL      Transfusion Status OK TO TRANSFUSE      Crossmatch Result Compatible      Unit Number [id number]      Blood Component Type RED CELLS,LR      Unit division 00      Status of Unit ISSUED,FINAL      Transfusion Status OK TO TRANSFUSE      Crossmatch Result Compatible      Unit Number [id number]      Blood Component Type RED CELLS,LR      Unit division 00      Status of Unit ALLOCATED      Transfusion Status OK TO TRANSFUSE      Crossmatch Result Compatible      Unit Number [id number]      Blood Component Type RED CELLS,LR      Unit division 00      Status of Unit  ALLOCATED      Transfusion Status OK TO TRANSFUSE      Crossmatch Result Compatible      Unit Number [id number]      Blood Component Type RED CELLS,LR      Unit division 00      Status of Unit ALLOCATED      Transfusion Status OK TO TRANSFUSE      Crossmatch Result Compatible     PREPARE RBC (CROSSMATCH)     Status: Normal   Collection Time   02/23/12 10:30 AM      Component Value Range Comment   Order Confirmation ORDER PROCESSED BY BLOOD BANK     GLUCOSE, CAPILLARY     Status: Abnormal   Collection Time   02/23/12  2:24 PM      Component Value Range Comment   Glucose-Capillary 155 (*) 70 - 99 (mg/dL)   CBC     Status: Abnormal   Collection Time   02/23/12  2:50 PM      Component Value Range Comment   WBC 16.3 (*) 4.0 - 10.5 (K/uL)    RBC 1.85 (*) 3.87 - 5.11 (MIL/uL)    Hemoglobin 5.5 (*) 12.0 - 15.0 (g/dL)    HCT 24.4 (*) 01.0 - 46.0 (%)    MCV 87.0  78.0 - 100.0 (fL)    MCH 29.7  26.0 - 34.0 (pg)    MCHC 34.2  30.0 - 36.0 (g/dL)    RDW 27.2  53.6 - 64.4 (%)    Platelets <5 (*) 150 - 400 (K/uL)   HIV ANTIBODY (ROUTINE TESTING)     Status: Normal   Collection Time   02/23/12  2:50 PM      Component Value Range Comment   HIV NON REACTIVE  NON REACTIVE    GLUCOSE, CAPILLARY     Status: Abnormal   Collection Time   02/23/12  4:07 PM      Component Value Range Comment   Glucose-Capillary 160 (*) 70 - 99 (mg/dL)   GLUCOSE, CAPILLARY     Status: Abnormal   Collection Time   02/23/12  8:13 PM      Component Value Range Comment   Glucose-Capillary 151 (*) 70 - 99 (  mg/dL)   CBC     Status: Abnormal   Collection Time   02/23/12 10:00 PM      Component Value Range Comment   WBC 17.8 (*) 4.0 - 10.5 (K/uL)    RBC 2.72 (*) 3.87 - 5.11 (MIL/uL)    Hemoglobin 8.6 (*) 12.0 - 15.0 (g/dL) POST TRANSFUSION SPECIMEN   HCT 23.8 (*) 36.0 - 46.0 (%)    MCV 87.5  78.0 - 100.0 (fL)    MCH 31.6  26.0 - 34.0 (pg)    MCHC 36.1 (*) 30.0 - 36.0 (g/dL)    RDW 40.9  81.1 - 91.4 (%)    Platelets <5 (*)  150 - 400 (K/uL) CRITICAL VALUE NOTED.  VALUE IS CONSISTENT WITH PREVIOUSLY REPORTED AND CALLED VALUE.  GLUCOSE, CAPILLARY     Status: Abnormal   Collection Time   02/23/12 11:50 PM      Component Value Range Comment   Glucose-Capillary 138 (*) 70 - 99 (mg/dL)   GLUCOSE, CAPILLARY     Status: Abnormal   Collection Time   02/24/12  3:24 AM      Component Value Range Comment   Glucose-Capillary 124 (*) 70 - 99 (mg/dL)   COMPREHENSIVE METABOLIC PANEL     Status: Abnormal   Collection Time   02/24/12  5:00 AM      Component Value Range Comment   Sodium 131 (*) 135 - 145 (mEq/L)    Potassium 4.2  3.5 - 5.1 (mEq/L)    Chloride 100  96 - 112 (mEq/L)    CO2 23  19 - 32 (mEq/L)    Glucose, Bld 132 (*) 70 - 99 (mg/dL)    BUN 19  6 - 23 (mg/dL)    Creatinine, Ser 7.82 (*) 0.50 - 1.10 (mg/dL)    Calcium 95.6  8.4 - 10.5 (mg/dL)    Total Protein 7.3  6.0 - 8.3 (g/dL)    Albumin 2.0 (*) 3.5 - 5.2 (g/dL)    AST 16  0 - 37 (U/L)    ALT 13  0 - 35 (U/L)    Alkaline Phosphatase 51  39 - 117 (U/L)    Total Bilirubin 0.9  0.3 - 1.2 (mg/dL)    GFR calc non Af Amer >90  >90 (mL/min)    GFR calc Af Amer >90  >90 (mL/min)   MAGNESIUM     Status: Normal   Collection Time   02/24/12  5:00 AM      Component Value Range Comment   Magnesium 1.5  1.5 - 2.5 (mg/dL)   PHOSPHORUS     Status: Normal   Collection Time   02/24/12  5:00 AM      Component Value Range Comment   Phosphorus 2.3  2.3 - 4.6 (mg/dL)   CBC     Status: Abnormal   Collection Time   02/24/12  5:00 AM      Component Value Range Comment   WBC 18.3 (*) 4.0 - 10.5 (K/uL) ADJUSTED FOR NUCLEATED RBC'S   RBC 2.37 (*) 3.87 - 5.11 (MIL/uL)    Hemoglobin 7.7 (*) 12.0 - 15.0 (g/dL)    HCT 21.3 (*) 08.6 - 46.0 (%)    MCV 89.0  78.0 - 100.0 (fL)    MCH 32.5  26.0 - 34.0 (pg)    MCHC 36.5 (*) 30.0 - 36.0 (g/dL)    RDW 57.8 (*) 46.9 - 15.5 (%)    Platelets <5 (*) 150 - 400 (K/uL) CRITICAL VALUE  NOTED.  VALUE IS CONSISTENT WITH PREVIOUSLY REPORTED AND  CALLED VALUE.  DIFFERENTIAL     Status: Abnormal   Collection Time   02/24/12  5:00 AM      Component Value Range Comment   Neutrophils Relative 74  43 - 77 (%)    Lymphocytes Relative 15  12 - 46 (%)    Monocytes Relative 10  3 - 12 (%)    Eosinophils Relative 0  0 - 5 (%)    Basophils Relative 1  0 - 1 (%)    Neutro Abs 13.6 (*) 1.7 - 7.7 (K/uL)    Lymphs Abs 2.7  0.7 - 4.0 (K/uL)    Monocytes Absolute 1.8 (*) 0.1 - 1.0 (K/uL)    Eosinophils Absolute 0.0  0.0 - 0.7 (K/uL)    Basophils Absolute 0.2 (*) 0.0 - 0.1 (K/uL)    RBC Morphology POLYCHROMASIA PRESENT      WBC Morphology INCREASED BANDS (>20% BANDS)     PREALBUMIN     Status: Abnormal   Collection Time   02/24/12  5:00 AM      Component Value Range Comment   Prealbumin 17.5 (*) 17.0 - 34.0 (mg/dL)   CHOLESTEROL, TOTAL     Status: Normal   Collection Time   02/24/12  5:00 AM      Component Value Range Comment   Cholesterol 72  0 - 200 (mg/dL)   TRIGLYCERIDES     Status: Normal   Collection Time   02/24/12  5:00 AM      Component Value Range Comment   Triglycerides 126  <150 (mg/dL)   GLUCOSE, CAPILLARY     Status: Abnormal   Collection Time   02/24/12  8:58 AM      Component Value Range Comment   Glucose-Capillary 149 (*) 70 - 99 (mg/dL)    Comment 1 Documented in Chart      Comment 2 Notify RN     GLUCOSE, CAPILLARY     Status: Abnormal   Collection Time   02/24/12  1:15 PM      Component Value Range Comment   Glucose-Capillary 160 (*) 70 - 99 (mg/dL)    Comment 1 Documented in Chart      Comment 2 Notify RN         Component Value Date/Time   SDES ABSCESS ABDOMEN DRAINAGE LEFT UPPER 02/13/2012 1900   SDES ABSCESS ABDOMEN DRAINAGE RIGHT UPPER 02/13/2012 1900   SPECREQUEST DRAIN NO 2 02/13/2012 1900   SPECREQUEST DRAIN NO 3 02/13/2012 1900   CULT  Value: MULTIPLE ORGANISMS PRESENT, NONE PREDOMINANT Note: NO STAPHYLOCOCCUS AUREUS ISOLATED NO GROUP A STREP (S.PYOGENES) ISOLATED 02/13/2012 1900   CULT  Value: RARE VIRIDANS  STREPTOCOCCUS ABUNDANT BACTEROIDES FRAGILIS Note: BETA LACTAMASE POSITIVE 02/13/2012 1900   REPTSTATUS 02/17/2012 FINAL 02/13/2012 1900   REPTSTATUS 02/17/2012 FINAL 02/13/2012 1900   No results found.  Thank you so much for this interesting consult,   Johny Sax [phone] 02/24/2012, 2:25 PM     LOS: 28 days

## 2012-02-24 NOTE — Consult Note (Signed)
Reason for Consult: Left epistaxis Referring Physician: Laurice Record, MD  HPI:  Vanessa [name] is an 36 y.o. 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  Pt now has severe thrombocytopenia (plt < 5k) of unknown etiology and rectal bleeding.  ENT is consulted for treatment of her severe left-sided epistaxis this AM.  Pt denies any previous h/o sinonasal disorder. No recent trauma/ENT surgery.  Past Medical History  Diagnosis Date  . Hypertension   . Cervical cancer     Past Surgical History  Procedure Date  . Cholecystectomy   . Radiation implants   . Colonoscopy 01/30/2012    Procedure: COLONOSCOPY;  Surgeon: Beverley Fiedler, MD;  Location: [facility] ENDOSCOPY;  Service: Gastroenterology;  Laterality: N/A;  . Colonoscopy 01/30/2012    Procedure: COLONOSCOPY;  Surgeon: Beverley Fiedler, MD;  Location: [facility] OR;  Service: Gastroenterology;  Laterality: N/A;  . Colostomy revision 02/04/2012    Procedure: COLON RESECTION SIGMOID;  Surgeon: Cherylynn Ridges, MD;  Location: [facility] OR;  Service: General;  Laterality: N/A;    History reviewed. No pertinent family history.  Social History:  reports that she has been smoking.  She does not have any smokeless tobacco history on file. She reports that she does not drink alcohol or use illicit drugs.  Allergies:  Allergies  Allergen Reactions  . Labetalol Hcl Itching and Other (See Comments)    Bumps to bilateral arms.  . Morphine And Related Hives    Medications:  I have reviewed the patient's current medications. Scheduled:   . alteplase  2 mg Intracatheter Once  . alteplase  2 mg Intracatheter Once  . barrier cream  1 application Topical TID  . cefTRIAXone (ROCEPHIN)  IV  1 g Intravenous Q24H  . hydrochlorothiazide  12.5 mg Oral Daily  . HYDROmorphone PCA 0.3 mg/mL   Intravenous Q4H  . IMMUNE GLOBULIN 10% (HUMAN) IV - For Fluid Restriction Only  1 g/kg Intravenous Q24H  .  insulin aspart  0-9 Units Subcutaneous Q4H  . magnesium sulfate 1 - 4 g bolus IVPB  2 g Intravenous Once  . magnesium sulfate 1 - 4 g bolus IVPB  4 g Intravenous Once  . methylPREDNISolone (SOLU-MEDROL) injection  60 mg Intravenous Q12H  . metronidazole  500 mg Intravenous Q8H  . morphine  15 mg Oral QHS  . morphine  30 mg Oral Daily  . mulitivitamin with minerals  1 tablet Oral Daily  . oxymetazoline  2 spray Each Nare BID  . sodium chloride 0.9 % 100 mL with tranexamic acid (CYKLOLAPRON) 300 mg infusion   Intravenous Q1H  . sodium chloride  10-40 mL Intracatheter Q12H  . sodium phosphate  Dextrose 5% IVPB  10 mmol Intravenous Once  . tranexamic acid (CYLOKAPRON) IVPB (ORTHO)  750 mg Intravenous Once  . DISCONTD: dexamethasone (DECADRON) IVPB  40 mg Intravenous Q24H  . DISCONTD: famotidine (PEPCID) IV  20 mg Intravenous Q12H  . DISCONTD: magnesium sulfate 1 - 4 g bolus IVPB  4 g Intravenous Once  . DISCONTD: mulitivitamin with minerals  1 tablet Oral Daily  . DISCONTD: tranexamic acid (CYKLOKAPRON) infusion ([facility])  2 mg/kg/hr (Order-Specific) Intravenous Once    Results for orders placed during the hospital encounter of 01/27/12 (from the past 48 hour(s))  CBC     Status: Abnormal   Collection Time   02/22/12  4:32 PM      Component Value Range  Comment   WBC 13.0 (*) 4.0 - 10.5 (K/uL)    RBC 2.38 (*) 3.87 - 5.11 (MIL/uL)    Hemoglobin 7.4 (*) 12.0 - 15.0 (g/dL) POST TRANSFUSION SPECIMEN   HCT 20.6 (*) 36.0 - 46.0 (%)    MCV 86.6  78.0 - 100.0 (fL)    MCH 31.1  26.0 - 34.0 (pg)    MCHC 35.9  30.0 - 36.0 (g/dL)    RDW 19.1  47.8 - 29.5 (%)    Platelets <5 (*) 150 - 400 (K/uL) CRITICAL VALUE NOTED.  VALUE IS CONSISTENT WITH PREVIOUSLY REPORTED AND CALLED VALUE.  CBC     Status: Abnormal   Collection Time   02/22/12  9:15 PM      Component Value Range Comment   WBC 13.6 (*) 4.0 - 10.5 (K/uL)    RBC 2.27 (*) 3.87 - 5.11 (MIL/uL)    Hemoglobin 7.1 (*) 12.0 - 15.0 (g/dL)    HCT 62.1 (*)  30.8 - 46.0 (%)    MCV 87.2  78.0 - 100.0 (fL)    MCH 31.3  26.0 - 34.0 (pg)    MCHC 35.9  30.0 - 36.0 (g/dL)    RDW 65.7 (*) 84.6 - 15.5 (%)    Platelets <5 (*) 150 - 400 (K/uL) CRITICAL VALUE NOTED.  VALUE IS CONSISTENT WITH PREVIOUSLY REPORTED AND CALLED VALUE.  BASIC METABOLIC PANEL     Status: Abnormal   Collection Time   02/23/12  4:35 AM      Component Value Range Comment   Sodium 131 (*) 135 - 145 (mEq/L)    Potassium 4.4  3.5 - 5.1 (mEq/L)    Chloride 100  96 - 112 (mEq/L)    CO2 24  19 - 32 (mEq/L)    Glucose, Bld 173 (*) 70 - 99 (mg/dL)    BUN 12  6 - 23 (mg/dL)    Creatinine, Ser 9.62 (*) 0.50 - 1.10 (mg/dL) DELTA CHECK NOTED   Calcium 9.8  8.4 - 10.5 (mg/dL)    GFR calc non Af Amer >90  >90 (mL/min)    GFR calc Af Amer >90  >90 (mL/min)   MAGNESIUM     Status: Normal   Collection Time   02/23/12  4:35 AM      Component Value Range Comment   Magnesium 1.6  1.5 - 2.5 (mg/dL)   PHOSPHORUS     Status: Abnormal   Collection Time   02/23/12  4:35 AM      Component Value Range Comment   Phosphorus 2.0 (*) 2.3 - 4.6 (mg/dL)   CBC     Status: Abnormal   Collection Time   02/23/12  4:35 AM      Component Value Range Comment   WBC 15.4 (*) 4.0 - 10.5 (K/uL)    RBC 2.12 (*) 3.87 - 5.11 (MIL/uL)    Hemoglobin 6.7 (*) 12.0 - 15.0 (g/dL)    HCT 95.2 (*) 84.1 - 46.0 (%)    MCV 86.3  78.0 - 100.0 (fL)    MCH 31.6  26.0 - 34.0 (pg)    MCHC 36.6 (*) 30.0 - 36.0 (g/dL)    RDW 32.4 (*) 40.1 - 15.5 (%)    Platelets <5 (*) 150 - 400 (K/uL) CRITICAL VALUE NOTED.  VALUE IS CONSISTENT WITH PREVIOUSLY REPORTED AND CALLED VALUE.  PREPARE RBC (CROSSMATCH)     Status: Normal   Collection Time   02/23/12  5:25 AM      Component  Value Range Comment   Order Confirmation ORDER PROCESSED BY BLOOD BANK     TYPE AND SCREEN     Status: Normal (Preliminary result)   Collection Time   02/23/12  5:25 AM      Component Value Range Comment   ABO/RH(D) A POS      Antibody Screen NEG      Sample Expiration  02/26/2012      Unit Number [id number]      Blood Component Type RED CELLS,LR      Unit division 00      Status of Unit ISSUED,FINAL      Transfusion Status OK TO TRANSFUSE      Crossmatch Result Compatible      Unit Number [id number]      Blood Component Type RED CELLS,LR      Unit division 00      Status of Unit ALLOCATED      Transfusion Status OK TO TRANSFUSE      Crossmatch Result Compatible      Unit Number [id number]      Blood Component Type RED CELLS,LR      Unit division 00      Status of Unit ISSUED,FINAL      Transfusion Status OK TO TRANSFUSE      Crossmatch Result Compatible      Unit Number [id number]      Blood Component Type RED CELLS,LR      Unit division 00      Status of Unit ISSUED,FINAL      Transfusion Status OK TO TRANSFUSE      Crossmatch Result Compatible      Unit Number [id number]      Blood Component Type RED CELLS,LR      Unit division 00      Status of Unit ALLOCATED      Transfusion Status OK TO TRANSFUSE      Crossmatch Result Compatible      Unit Number [id number]      Blood Component Type RED CELLS,LR      Unit division 00      Status of Unit ALLOCATED      Transfusion Status OK TO TRANSFUSE      Crossmatch Result Compatible      Unit Number [id number]      Blood Component Type RED CELLS,LR      Unit division 00      Status of Unit ALLOCATED      Transfusion Status OK TO TRANSFUSE      Crossmatch Result Compatible     PREPARE RBC (CROSSMATCH)     Status: Normal   Collection Time   02/23/12 10:30 AM      Component Value Range Comment   Order Confirmation ORDER PROCESSED BY BLOOD BANK     CBC     Status: Abnormal   Collection Time   02/23/12  2:50 PM      Component Value Range Comment   WBC 16.3 (*) 4.0 - 10.5 (K/uL)    RBC 1.85 (*) 3.87 - 5.11 (MIL/uL)    Hemoglobin 5.5 (*) 12.0 - 15.0 (g/dL)    HCT 38.7 (*) 56.4 - 46.0 (%)    MCV 87.0  78.0 - 100.0 (fL)    MCH 29.7  26.0 - 34.0 (pg)    MCHC 34.2  30.0 - 36.0 (g/dL)    RDW 33.2  95.1 -  88.4 (%)    Platelets <5 (*) 150 - 400 (K/uL)  HIV ANTIBODY (ROUTINE TESTING)     Status: Normal   Collection Time   02/23/12  2:50 PM      Component Value Range Comment   HIV NON REACTIVE  NON REACTIVE    CBC     Status: Abnormal   Collection Time   02/23/12 10:00 PM      Component Value Range Comment   WBC 17.8 (*) 4.0 - 10.5 (K/uL)    RBC 2.72 (*) 3.87 - 5.11 (MIL/uL)    Hemoglobin 8.6 (*) 12.0 - 15.0 (g/dL) POST TRANSFUSION SPECIMEN   HCT 23.8 (*) 36.0 - 46.0 (%)    MCV 87.5  78.0 - 100.0 (fL)    MCH 31.6  26.0 - 34.0 (pg)    MCHC 36.1 (*) 30.0 - 36.0 (g/dL)    RDW 16.1  09.6 - 04.5 (%)    Platelets <5 (*) 150 - 400 (K/uL) CRITICAL VALUE NOTED.  VALUE IS CONSISTENT WITH PREVIOUSLY REPORTED AND CALLED VALUE.  COMPREHENSIVE METABOLIC PANEL     Status: Abnormal   Collection Time   02/24/12  5:00 AM      Component Value Range Comment   Sodium 131 (*) 135 - 145 (mEq/L)    Potassium 4.2  3.5 - 5.1 (mEq/L)    Chloride 100  96 - 112 (mEq/L)    CO2 23  19 - 32 (mEq/L)    Glucose, Bld 132 (*) 70 - 99 (mg/dL)    BUN 19  6 - 23 (mg/dL)    Creatinine, Ser 4.09 (*) 0.50 - 1.10 (mg/dL)    Calcium 81.1  8.4 - 10.5 (mg/dL)    Total Protein 7.3  6.0 - 8.3 (g/dL)    Albumin 2.0 (*) 3.5 - 5.2 (g/dL)    AST 16  0 - 37 (U/L)    ALT 13  0 - 35 (U/L)    Alkaline Phosphatase 51  39 - 117 (U/L)    Total Bilirubin 0.9  0.3 - 1.2 (mg/dL)    GFR calc non Af Amer >90  >90 (mL/min)    GFR calc Af Amer >90  >90 (mL/min)   MAGNESIUM     Status: Normal   Collection Time   02/24/12  5:00 AM      Component Value Range Comment   Magnesium 1.5  1.5 - 2.5 (mg/dL)   PHOSPHORUS     Status: Normal   Collection Time   02/24/12  5:00 AM      Component Value Range Comment   Phosphorus 2.3  2.3 - 4.6 (mg/dL)   CBC     Status: Abnormal   Collection Time   02/24/12  5:00 AM      Component Value Range Comment   WBC 18.3 (*) 4.0 - 10.5 (K/uL) ADJUSTED FOR NUCLEATED RBC'S   RBC 2.37 (*) 3.87 - 5.11 (MIL/uL)     Hemoglobin 7.7 (*) 12.0 - 15.0 (g/dL)    HCT 91.4 (*) 78.2 - 46.0 (%)    MCV 89.0  78.0 - 100.0 (fL)    MCH 32.5  26.0 - 34.0 (pg)    MCHC 36.5 (*) 30.0 - 36.0 (g/dL)    RDW 95.6 (*) 21.3 - 15.5 (%)    Platelets <5 (*) 150 - 400 (K/uL) CRITICAL VALUE NOTED.  VALUE IS CONSISTENT WITH PREVIOUSLY REPORTED AND CALLED VALUE.  DIFFERENTIAL     Status: Abnormal   Collection Time   02/24/12  5:00 AM      Component Value Range Comment  Neutrophils Relative 74  43 - 77 (%)    Lymphocytes Relative 15  12 - 46 (%)    Monocytes Relative 10  3 - 12 (%)    Eosinophils Relative 0  0 - 5 (%)    Basophils Relative 1  0 - 1 (%)    Neutro Abs 13.6 (*) 1.7 - 7.7 (K/uL)    Lymphs Abs 2.7  0.7 - 4.0 (K/uL)    Monocytes Absolute 1.8 (*) 0.1 - 1.0 (K/uL)    Eosinophils Absolute 0.0  0.0 - 0.7 (K/uL)    Basophils Absolute 0.2 (*) 0.0 - 0.1 (K/uL)    RBC Morphology POLYCHROMASIA PRESENT      WBC Morphology INCREASED BANDS (>20% BANDS)     CHOLESTEROL, TOTAL     Status: Normal   Collection Time   02/24/12  5:00 AM      Component Value Range Comment   Cholesterol 72  0 - 200 (mg/dL)   TRIGLYCERIDES     Status: Normal   Collection Time   02/24/12  5:00 AM      Component Value Range Comment   Triglycerides 126  <150 (mg/dL)     No results found.  Review of Systems  HENT: Epistaxis. Eyes: Negative.  Respiratory: Negative.  Cardiovascular: Negative.  Gastrointestinal: Positive for nausea, vomiting, abdominal pain and diarrhea. Negative for heartburn, constipation. Positive for rectal bleeding Genitourinary: Positive for dysuria and urgency. Musculoskeletal: Negative.  Skin: Negative.  Neurological: Negative.  Endo/Heme/Allergies: As above Psychiatric/Behavioral: Negative.   Blood pressure 144/100, pulse 103, temperature 98 F (36.7 C), temperature source Oral, resp. rate 20, height 5\' 4"  (1.626 m), weight 75.388 kg (166 lb 3.2 oz), SpO2 100.00%.  Physical Exam  Constitutional: She is oriented to  person, place, and time. She appears well-developed and well-nourished. She appears mildly distressed due to her persistent left epistaxis. Head: Normocephalic and atraumatic.  Right Ear: External ear normal.  Left Ear: External ear normal.  Nose: Active bleeding from the left nostril. No bleeding from the right. Septum midline. Mouth/Throat: Oropharynx with blood. No lesion. Eyes: Conjunctivae and EOM are normal. Pupils are equal, round, and reactive to light.  Neck: Normal range of motion. Neck supple.  Cardiovascular: Normal rate, regular rhythm, normal heart sounds and intact distal pulses.  Respiratory: Effort normal and breath sounds normal.  Musculoskeletal: Normal range of motion.  Neurological: She is alert and oriented to person, place, and time. She has normal reflexes.  Skin: Skin is warm and dry.  Psychiatric: She has a normal mood and affect. Her behavior is normal. Judgment and thought content normal.   Procedure: Anterior/Posterior nasal packing for control of left epistaxis. Anesthesia: Topical xylocaine and Afrin Description: The patient is placed upright in her hospital bed.  Blood clot is suctions from the left nasal cavity. Bleeding is noted from anterior and posterior left nasal cavity.Topical xylocaine and Afrin are applied. A 10 cm Merocel packing is placed in the left nasal cavity. The patient tolerated the procedure well.  Assessment/Plan: Active left anterior and posterior epistaxis secondary to severe thrombocytopenia.  The left nasal cavity is packed with an AP merocel packing.  In light of her severe thrombocytopenia, she will likely have persistent oozing around the packing.  May need platelet transfusions prn. Will follow.  Gearald Stonebraker Philomena Doheny, MD 02/24/2012, 9:58 AM

## 2012-02-25 LAB — CBC
HCT: 18.6 % — ABNORMAL LOW (ref 36.0–46.0)
HCT: 16 % — ABNORMAL LOW (ref 36.0–46.0)
Hemoglobin: 6.9 g/dL — CL (ref 12.0–15.0)
Hemoglobin: 6.2 g/dL — CL (ref 12.0–15.0)
MCH: 31.8 pg (ref 26.0–34.0)
MCH: 30.5 pg (ref 26.0–34.0)
MCV: 85.7 fL (ref 78.0–100.0)
MCV: 84.2 fL (ref 78.0–100.0)
Platelets: <5 10*3/uL — CL (ref 150–400)
Platelets: <5 10*3/uL — CL (ref 150–400)
RBC: 2.17 MIL/uL — ABNORMAL LOW (ref 3.87–5.11)
RBC: 1.88 MIL/uL — ABNORMAL LOW (ref 3.87–5.11)
RBC: 2.03 MIL/uL — ABNORMAL LOW (ref 3.87–5.11)
RDW: 14.2 % (ref 11.5–15.5)
WBC: 27.2 10*3/uL — ABNORMAL HIGH (ref 4.0–10.5)

## 2012-02-25 LAB — BASIC METABOLIC PANEL
CO2: 25 mEq/L (ref 19–32)
Chloride: 101 mEq/L (ref 96–112)
GFR calc non Af Amer: >90 mL/min (ref >90)
Glucose, Bld: 177 mg/dL — ABNORMAL HIGH (ref 70–99)
Potassium: 4.5 mEq/L (ref 3.5–5.1)
Sodium: 131 mEq/L — ABNORMAL LOW (ref 135–145)

## 2012-02-25 LAB — MAGNESIUM: Magnesium: 1.5 mg/dL (ref 1.5–2.5)

## 2012-02-25 LAB — GLUCOSE, CAPILLARY
Glucose-Capillary: 144 mg/dL — ABNORMAL HIGH (ref 70–99)
Glucose-Capillary: 131 mg/dL — ABNORMAL HIGH (ref 70–99)

## 2012-02-25 MED ORDER — AMINOCAPROIC ACID 500 MG PO TABS
500.0000 mg | ORAL_TABLET | Freq: Four times a day (QID) | ORAL | Status: DC
  Administered 2012-02-25 (×2): 500 mg via ORAL
  Filled 2012-02-25 (×4): qty 1

## 2012-02-25 MED ORDER — FAMOTIDINE 10 MG/ML IV SOLN
INTRAVENOUS | Status: AC
  Administered 2012-02-25: 17:00:00 via INTRAVENOUS
  Filled 2012-02-25: qty 2000

## 2012-02-25 MED ORDER — AMINOCAPROIC ACID SOLUTION 5% (50 MG/ML)
500.0000 mg | Freq: Four times a day (QID) | ORAL | Status: DC
  Filled 2012-02-25: qty 100

## 2012-02-25 MED ORDER — AMINOCAPROIC ACID 500 MG PO TABS
1.0000 g | ORAL_TABLET | Freq: Four times a day (QID) | ORAL | Status: DC
  Administered 2012-02-25 (×2): 1 g via ORAL
  Filled 2012-02-25 (×6): qty 2

## 2012-02-25 MED ORDER — ACETAMINOPHEN 325 MG PO TABS
650.0000 mg | ORAL_TABLET | Freq: Once | ORAL | Status: AC
  Administered 2012-02-25: 650 mg via ORAL

## 2012-02-25 MED ORDER — MAGNESIUM SULFATE 40 MG/ML IJ SOLN
4.0000 g | Freq: Once | INTRAMUSCULAR | Status: AC
  Administered 2012-02-25: 4 g via INTRAVENOUS
  Filled 2012-02-25: qty 100

## 2012-02-25 MED ORDER — SODIUM PHOSPHATE 3 MMOLE/ML IV SOLN
10.0000 mmol | Freq: Once | INTRAVENOUS | Status: AC
  Administered 2012-02-25: 10 mmol via INTRAVENOUS
  Filled 2012-02-25: qty 3.33

## 2012-02-25 MED ORDER — AMINOCAPROIC ACID 500 MG PO TABS
500.0000 mg | ORAL_TABLET | Freq: Four times a day (QID) | ORAL | Status: DC
  Filled 2012-02-25 (×4): qty 1

## 2012-02-25 NOTE — Progress Notes (Signed)
Chaplain Note:  Chaplain visited with pt.  Pt's nurse was present at the beginning and end of the visit.  Pt was reclining in bed, awake, and alert.  Pt is experiencing anxiousness over her health condition.  She desires to be home with her children.  She also expresses questions about why/how her body has become so ill.  She is afraid yet her faith, though shaken, is in tact.  Pt also enjoys strong family support. Chaplain helped pt in processing her fears.  Chaplain provided spiritual comfort, support, and prayer.  Chaplain will follow up as needed.  02/25/12 1500  Clinical Encounter Type  Visited With Patient  Visit Type Spiritual support;Initial  Referral From Nurse  Spiritual Encounters  Spiritual Needs Emotional;Prayer  Stress Factors  Patient Stress Factors Loss of control;Lack of knowledge;Health changes;Exhausted;Major life changes  Family Stress Factors None identified (No family present at this time)   Vanessa [name], chaplain resident [phone]

## 2012-02-25 NOTE — Progress Notes (Signed)
21 Days Post-Op  Subjective: Awake. Alert. Mental status normal normal. Denies nausea. Hungry. Denies headache. Abdominal pain mild.  Platelet count less than 500. Still having some rectal bleeding and some nasal bleeding requiring transfusion deep hemodynamically stable. Pulmonary status, renal status, cardiac status stable. Afebrile.  Hematologic problems discuss with Dr. Coral Ceo. She and other consultants feel that this is drug-related, possibly even due to beta-lactam. Several interventions are being tried. The hope is that the thrombus cytopenia will resolve, but how fast this will occur is not clear. Dr. Lissa Hoard is going to give some platelets today.  Objective: Vital signs in last 24 hours: Temp:  [97.4 F (36.3 C)-98.4 F (36.9 C)] 98.4 F (36.9 C) (06/04 0400) Pulse Rate:  [88-139] 99  (06/04 0700) Resp:  [11-30] 19  (06/04 0700) BP: (81-144)/(39-100) 129/84 mmHg (06/04 0700) SpO2:  [41 %-100 %] 100 % (06/04 0700) Weight:  [165 lb 4.8 oz (74.98 kg)] 165 lb 4.8 oz (74.98 kg) (06/04 0400) Last BM Date: 02/24/12  Intake/Output from previous day: 06/03 0701 - 06/04 0700 In: 3846.8 [P.O.:170; I.V.:540; Blood:699.3; IV Piggyback:757.5; TPN:1680] Out: 3690 [Urine:1705; Drains:35; Stool:1950] Intake/Output this shift:   General appearance: alert. Mental  status normal. In no distress. Asking for food. Abdomen. Generally soft, especially upper abdomen. Lower abdomen a little tender but no peritoneal signs. No mass. Wounds are okay. Drainage purulent. Ileostomy pink.  Lab Results:  Results for orders placed during the hospital encounter of 01/27/12 (from the past 24 hour(s))  GLUCOSE, CAPILLARY     Status: Abnormal   Collection Time   02/24/12  8:58 AM      Component Value Range   Glucose-Capillary 149 (*) 70 - 99 (mg/dL)   Comment 1 Documented in Chart     Comment 2 Notify RN    GLUCOSE, CAPILLARY     Status: Abnormal   Collection Time   02/24/12  1:15 PM      Component Value  Range   Glucose-Capillary 160 (*) 70 - 99 (mg/dL)   Comment 1 Documented in Chart     Comment 2 Notify RN    CBC     Status: Abnormal   Collection Time   02/24/12  3:23 PM      Component Value Range   WBC 28.1 (*) 4.0 - 10.5 (K/uL)   RBC 1.63 (*) 3.87 - 5.11 (MIL/uL)   Hemoglobin 5.1 (*) 12.0 - 15.0 (g/dL)   HCT 96.0 (*) 45.4 - 46.0 (%)   MCV 88.3  78.0 - 100.0 (fL)   MCH 31.3  26.0 - 34.0 (pg)   MCHC 35.4  30.0 - 36.0 (g/dL)   RDW 09.8  11.9 - 14.7 (%)   Platelets <5 (*) 150 - 400 (K/uL)  GLUCOSE, CAPILLARY     Status: Abnormal   Collection Time   02/24/12  3:28 PM      Component Value Range   Glucose-Capillary 143 (*) 70 - 99 (mg/dL)   Comment 1 Documented in Chart     Comment 2 Notify RN    PREPARE RBC (CROSSMATCH)     Status: Normal   Collection Time   02/24/12  3:43 PM      Component Value Range   Order Confirmation ORDER PROCESSED BY BLOOD BANK    GLUCOSE, CAPILLARY     Status: Abnormal   Collection Time   02/24/12  5:00 PM      Component Value Range   Glucose-Capillary 150 (*) 70 - 99 (mg/dL)  Comment 1 Notify RN    CBC     Status: Abnormal   Collection Time   02/24/12 10:00 PM      Component Value Range   WBC 21.2 (*) 4.0 - 10.5 (K/uL)   RBC 2.45 (*) 3.87 - 5.11 (MIL/uL)   Hemoglobin 7.7 (*) 12.0 - 15.0 (g/dL)   HCT 96.0 (*) 45.4 - 46.0 (%)   MCV 86.1  78.0 - 100.0 (fL)   MCH 31.4  26.0 - 34.0 (pg)   MCHC 36.5 (*) 30.0 - 36.0 (g/dL)   RDW 09.8  11.9 - 14.7 (%)   Platelets <5 (*) 150 - 400 (K/uL)  BASIC METABOLIC PANEL     Status: Abnormal   Collection Time   02/25/12  4:44 AM      Component Value Range   Sodium 131 (*) 135 - 145 (mEq/L)   Potassium 4.5  3.5 - 5.1 (mEq/L)   Chloride 101  96 - 112 (mEq/L)   CO2 25  19 - 32 (mEq/L)   Glucose, Bld 177 (*) 70 - 99 (mg/dL)   BUN 18  6 - 23 (mg/dL)   Creatinine, Ser 8.29 (*) 0.50 - 1.10 (mg/dL)   Calcium 56.2  8.4 - 10.5 (mg/dL)   GFR calc non Af Amer >90  >90 (mL/min)   GFR calc Af Amer >90  >90 (mL/min)  CBC      Status: Abnormal   Collection Time   02/25/12  4:44 AM      Component Value Range   WBC 20.3 (*) 4.0 - 10.5 (K/uL)   RBC 2.17 (*) 3.87 - 5.11 (MIL/uL)   Hemoglobin 6.9 (*) 12.0 - 15.0 (g/dL)   HCT 13.0 (*) 86.5 - 46.0 (%)   MCV 85.7  78.0 - 100.0 (fL)   MCH 31.8  26.0 - 34.0 (pg)   MCHC 37.1 (*) 30.0 - 36.0 (g/dL)   RDW 78.4  69.6 - 29.5 (%)   Platelets <5 (*) 150 - 400 (K/uL)  MAGNESIUM     Status: Normal   Collection Time   02/25/12  4:44 AM      Component Value Range   Magnesium 1.5  1.5 - 2.5 (mg/dL)  PHOSPHORUS     Status: Abnormal   Collection Time   02/25/12  4:44 AM      Component Value Range   Phosphorus 2.2 (*) 2.3 - 4.6 (mg/dL)     Studies/Results: @[id number] @     . alteplase  2 mg Intracatheter Once  . aminocaproic acid  500 mg Oral Q6H  . barrier cream  1 application Topical TID  . folic acid  1 mg Intravenous Daily  . hydrochlorothiazide  12.5 mg Oral Daily  . HYDROmorphone PCA 0.3 mg/mL   Intravenous Q4H  . insulin aspart  0-9 Units Subcutaneous Q4H  . levofloxacin (LEVAQUIN) IV  750 mg Intravenous Q24H  . LORazepam  1 mg Intravenous Once  . magnesium sulfate 1 - 4 g bolus IVPB  4 g Intravenous Once  . magnesium sulfate 1 - 4 g bolus IVPB  4 g Intravenous Once  . methylPREDNISolone (SOLU-MEDROL) injection  60 mg Intravenous Q12H  . metronidazole  500 mg Intravenous Q8H  . morphine  15 mg Oral QHS  . morphine  30 mg Oral Daily  . mulitivitamin with minerals  1 tablet Oral Daily  . oxymetazoline  2 spray Each Nare BID  . sodium chloride 0.9 % 100 mL with tranexamic acid (CYKLOLAPRON) 300 mg infusion  Intravenous Q1H  . sodium chloride  10-40 mL Intracatheter Q12H  . sodium phosphate  Dextrose 5% IVPB  10 mmol Intravenous Once  . tranexamic acid (CYLOKAPRON) IVPB (ORTHO)  750 mg Intravenous Once  . DISCONTD: cefTRIAXone (ROCEPHIN)  IV  1 g Intravenous Q24H  . DISCONTD: famotidine (PEPCID) IV  20 mg Intravenous Q12H  . DISCONTD: magnesium sulfate 1 - 4 g  bolus IVPB  4 g Intravenous Once  . DISCONTD: tranexamic acid (CYKLOKAPRON) infusion ([facility])  2 mg/kg/hr (Order-Specific) Intravenous Once     Assessment/Plan: s/p Procedure(s): COLON RESECTION SIGMOID  Consultative advice by Dr. Darrold Span, infectious disease, critical care medicine, otolaryngology are all greatly appreciated in the management of this patient.  Surgical decision making is complex. Eventual laparotomy will be rough and bloody even under normal circumstances. Laparotomy in the face of profound thrombocytopenia will be life-threatening.  Since the patient is stable and not septic we will wait another 24 hours and discuss on a daily basis with Dr. Roylene Reason [place] say. Eventually we will need to do this laparotomy and it will be high-risk regardless.  If she becomes septic or deteriorates in anyway we will be forced to operate sooner  High risk ICH. Follow neuro exam closely.    LOS: 29 days    Breeann Reposa M. Derrell Lolling, M.D., [facility] General and Minimally invasive Surgery Breast and Colorectal Surgery Office:   [phone] Pager:   [phone]  02/25/2012  . .prob

## 2012-02-25 NOTE — Progress Notes (Signed)
  02/25/2012, 8:00 AM  Hospital day: 30 Antibiotics: levaquin, flagyl Solumedrol begun 5-31 pm IVIG 6-1 and 6-2 Nplate 6-1 Tranexamic acid 02-24-12  Discussed with Dr Derrell Lolling on unit now. Subjective: Awake working crosswords, feels a little drowsy, did sleep last pm. Patient believes less rectal bleeding overnight. Uncomfortable from nasal packing. Abdominal pain seems some better, still using dilaudid PCA. Denies HA or increased shortness of breath. Per nursing,  Objective: Vital signs in last 24 hours: Blood pressure 129/84, pulse 99, temperature 98.4 F (36.9 C), temperature source Oral, resp. rate 19, height 5\' 4"  (1.626 m), weight 74.98 kg (165 lb 4.8 oz), SpO2 100.00%. Some red blood on gauze reinforcing nasal packing on left. Mouth moist. PERRL, not icteric. Respirations not labored RA. Lungs without wheezes or rales. Heart RRR without gallop. PICC RUE site ok without bleeding. Abdomen soft, no increase in distension, quiet. Soft stool in ileostomy bag without overt blood. No bleeding from midline incision or JPs. SCD on LE, feet warm. Moves all extremities easily. Speech fluent.  Intake/Output from previous day: 06/03 0701 - 06/04 0700 In: 3846.8 [P.O.:170; I.V.:540; Blood:699.3; IV Piggyback:757.5; TPN:1680] Out: 3690 [Urine:1705; Drains:35; Stool:1950] Intake/Output this shift:    Lab Results:  Basename 02/25/12 0444 02/24/12 2200  WBC 20.3* 21.2*  HGB 6.9* 7.7*  HCT 18.6* 21.1*  PLT <5* <5*   BMET  Basename 02/25/12 0444 02/24/12 0500  NA 131* 131*  K 4.5 4.2  CL 101 100  CO2 25 23  GLUCOSE 177* 132*  BUN 18 19  CREATININE 0.32* 0.36*  CALCIUM 10.0 10.1   Platelet antibodies pending HLA typing in process ANA from 02-22-12 positive at just 1:40 HIT negative  Studies/Results: No results found.  Discussed with pharmacists again now. They have located enough oral amicar to use this now; Dr Derrell Lolling agrees with oral medications tho she is otherwise NPO.  Discussed  with Dr Delton Coombes here now.  Assessment/Plan: 1.Acute, profound thrombocytopenia: believe to be drug-related, multiple possibilities including cephalosporin DCd 02-24-12. HIT negative. Add po amicar. Continue present steroids. Transfuse platelets with 30 min post transfusion platelet count. ENT has packed nose. ID following and managing antibiotics. 2.multiple walled-off intraabdominal abscesses and possible anastomotic breakdown: general surgery closely involved and will decide day by day about timing of what will eventually be necessary surgery. 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 No recurrent cancer in path from recent surgery or by CTs.Post sigmoid resection with ileostomy. 4.PICC and JPs in 5.poor nutritional status, on TNA  Vanessa [name] P

## 2012-02-25 NOTE — Progress Notes (Signed)
Subjective: Pt resting comfortably in bed.  Still has slight oozing around her left nasal packing.   Objective: Vital signs in last 24 hours: Temp:  [97.4 F (36.3 C)-98 F (36.7 C)] 97.5 F (36.4 C) (06/04 0000) Pulse Rate:  [82-139] 96  (06/04 0000) Resp:  [11-30] 18  (06/04 0000) BP: (81-152)/(39-101) 136/96 mmHg (06/03 2300) SpO2:  [98 %-100 %] 100 % (06/04 0000)  Physical Exam  Constitutional: Well-developed and well-nourished. No distress. Head: Normocephalic and atraumatic.  Right Ear: External ear normal.  Left Ear: External ear normal.  Nose: Slight oozing around the left nostril. Left AP Merocel packing in place. Septum midline.  Mouth/Throat: Oropharynx without blood. No lesion.  Eyes: Conjunctivae normal. Pupils are equal, round, and reactive to light.  Neck: Normal range of motion. Neck supple.    Basename 02/24/12 2200 02/24/12 1523  WBC 21.2* 28.1*  HGB 7.7* 5.1*  HCT 21.1* 14.4*  PLT <5* <5*    Basename 02/24/12 0500 02/23/12 0435  NA 131* 131*  K 4.2 4.4  CL 100 100  CO2 23 24  GLUCOSE 132* 173*  BUN 19 12  CREATININE 0.36* 0.40*  CALCIUM 10.1 9.8    Medications:  I have reviewed the patient's current medications. Scheduled:   . alteplase  2 mg Intracatheter Once  . barrier cream  1 application Topical TID  . folic acid  1 mg Intravenous Daily  . hydrochlorothiazide  12.5 mg Oral Daily  . HYDROmorphone PCA 0.3 mg/mL   Intravenous Q4H  . insulin aspart  0-9 Units Subcutaneous Q4H  . levofloxacin (LEVAQUIN) IV  750 mg Intravenous Q24H  . LORazepam  1 mg Intravenous Once  . magnesium sulfate 1 - 4 g bolus IVPB  4 g Intravenous Once  . methylPREDNISolone (SOLU-MEDROL) injection  60 mg Intravenous Q12H  . metronidazole  500 mg Intravenous Q8H  . morphine  15 mg Oral QHS  . morphine  30 mg Oral Daily  . mulitivitamin with minerals  1 tablet Oral Daily  . oxymetazoline  2 spray Each Nare BID  . sodium chloride 0.9 % 100 mL with tranexamic acid  (CYKLOLAPRON) 300 mg infusion   Intravenous Q1H  . sodium chloride  10-40 mL Intracatheter Q12H  . tranexamic acid (CYLOKAPRON) IVPB (ORTHO)  750 mg Intravenous Once  . DISCONTD: cefTRIAXone (ROCEPHIN)  IV  1 g Intravenous Q24H  . DISCONTD: famotidine (PEPCID) IV  20 mg Intravenous Q12H  . DISCONTD: magnesium sulfate 1 - 4 g bolus IVPB  4 g Intravenous Once  . DISCONTD: tranexamic acid (CYKLOKAPRON) infusion ([facility])  2 mg/kg/hr (Order-Specific) Intravenous Once    Assessment/Plan: Left anterior and posterior epistaxis secondary to severe thrombocytopenia. The left nasal cavity is packed with an AP merocel packing. In light of her severe thrombocytopenia, she will likely have persistent oozing around the packing. May need platelet transfusions prn. Will follow.    LOS: 29 days   Rheda Kassab,SUI W 02/25/2012, 3:31 AM

## 2012-02-25 NOTE — Progress Notes (Signed)
PARENTERAL NUTRITION CONSULT NOTE - FOLLOW UP  Pharmacy Consult for TPN Indication: intraabdominal abscess, ileus  Allergies  Allergen Reactions  . Labetalol Hcl Itching and Other (See Comments)    Bumps to bilateral arms.  . Morphine And Related Hives    Patient Measurements: Height: 5\' 4"  (162.6 cm) Weight: 165 lb 4.8 oz (74.98 kg) IBW/kg (Calculated) : 54.7   Vital Signs: Temp: 98.4 F (36.9 C) (06/04 0400) Temp src: Oral (06/04 0400) BP: 129/84 mmHg (06/04 0700) Pulse Rate: 99  (06/04 0700) Intake/Output from previous day: 06/03 0701 - 06/04 0700 In: 3846.8 [P.O.:170; I.V.:540; Blood:699.3; IV Piggyback:757.5; TPN:1680] Out: 3690 [Urine:1705; Drains:35; Stool:1950] Intake/Output from this shift:    Labs:  [facility] 02/25/12 0444 02/24/12 2200 02/24/12 1523  WBC 20.3* 21.2* 28.1*  HGB 6.9* 7.7* 5.1*  HCT 18.6* 21.1* 14.4*  PLT <5* <5* <5*  APTT -- -- --  INR -- -- --     Basename 02/25/12 0444 02/24/12 0500 02/23/12 0435  NA 131* 131* 131*  K 4.5 4.2 4.4  CL 101 100 100  CO2 25 23 24   GLUCOSE 177* 132* 173*  BUN 18 19 12   CREATININE 0.32* 0.36* 0.40*  LABCREA -- -- --  CREAT24HRUR -- -- --  CALCIUM 10.0 10.1 9.8  MG 1.5 1.5 1.6  PHOS 2.2* 2.3 2.0*  PROT -- 7.3 --  ALBUMIN -- 2.0* --  AST -- 16 --  ALT -- 13 --  ALKPHOS -- 51 --  BILITOT -- 0.9 --  BILIDIR -- -- --  IBILI -- -- --  PREALBUMIN -- 17.5* --  TRIG -- 126 --  CHOLHDL -- -- --  CHOL -- 72 --   Estimated Creatinine Clearance: 96.4 ml/min (by C-G formula based on Cr of 0.32).    Basename 02/24/12 1700 02/24/12 1528 02/24/12 1315  GLUCAP 150* 143* 160*    Medications:  Scheduled:     . alteplase  2 mg Intracatheter Once  . barrier cream  1 application Topical TID  . folic acid  1 mg Intravenous Daily  . hydrochlorothiazide  12.5 mg Oral Daily  . HYDROmorphone PCA 0.3 mg/mL   Intravenous Q4H  . insulin aspart  0-9 Units Subcutaneous Q4H  . levofloxacin (LEVAQUIN) IV  750 mg  Intravenous Q24H  . LORazepam  1 mg Intravenous Once  . magnesium sulfate 1 - 4 g bolus IVPB  4 g Intravenous Once  . methylPREDNISolone (SOLU-MEDROL) injection  60 mg Intravenous Q12H  . metronidazole  500 mg Intravenous Q8H  . morphine  15 mg Oral QHS  . morphine  30 mg Oral Daily  . mulitivitamin with minerals  1 tablet Oral Daily  . oxymetazoline  2 spray Each Nare BID  . sodium chloride 0.9 % 100 mL with tranexamic acid (CYKLOLAPRON) 300 mg infusion   Intravenous Q1H  . sodium chloride  10-40 mL Intracatheter Q12H  . tranexamic acid (CYLOKAPRON) IVPB (ORTHO)  750 mg Intravenous Once  . DISCONTD: cefTRIAXone (ROCEPHIN)  IV  1 g Intravenous Q24H  . DISCONTD: famotidine (PEPCID) IV  20 mg Intravenous Q12H  . DISCONTD: magnesium sulfate 1 - 4 g bolus IVPB  4 g Intravenous Once  . DISCONTD: tranexamic acid (CYKLOKAPRON) infusion ([facility])  2 mg/kg/hr (Order-Specific) Intravenous Once   Infusions:     . fat emulsion 250 mL (02/24/12 1712)  . 0.9 % sodium chloride with kcl 80 mL/hr at 02/25/12 0700  . TPN (CLINIMIX) +/- additives 70 mL/hr at 02/23/12 2000  .  TPN (CLINIMIX) +/- additives 70 mL/hr at 02/24/12 1712    Insulin Requirements in the past 24 hours:  3 units SSI with NO insulin in TNA with q4h sensitive SSI  Current Nutrition:  Clinimix E5/20 at 70 ml/hr (at goal)  Nutritional Goals:  1800-2000 kCal, 75-85 grams of protein per day Clinimix E5/20 goal rate of 30ml/hr will provide an average of 1684 kcal and 84g protein per day based on RD assessment (94% total kcal and 100% protein needs)  Assessment: 33 yof with a history of cervical cancer who is s/p sigmoid colectomy and diverting ileostomy on 5/14. Developed multiple intra-abdominal abscesses 2/2 anastomotic leak shown on CT and had 3 perc drains placed. On 5/29 patient had multiple blood BM, hemoptysis, and abdominal pain. Plts <5 (240k on 5/25). CT 5/30 showed additional abscesses and concern for fistula formation.    Anticoag:  Thrombocytopenia (plts <5) and anemia with bloody BMs and epistaxis. ID suspects plt drop from beta-lactams.   S/p blood product transfusions (5/29: 1 unit, 5/30: 3 units, 5/31: 4 units, 6/1: 1 unit), PRBC (5/29:2 units, 5/31: 2 units, 6/1: 2 units, 6/2: 3 units, 6/3: 2 units ), FFP (5/30: 2 units, 6/1: 2 units), and tranexamic acid (6/3: 10mg /kg IV load, then 2mg /kg/hr x 2 hours).  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 Anastomotic breakdown leading to many intra-abdominal abscesses and concern for fistula on recent CT 5/30. Perc drains in place with 25ml/24hr drainage. Pt may need more drains placed but unable to take to surgery with low plt count. Pt with significant amount of hemoptysis/hematochezia. Unknown GI absorption. Endo: No hx DM. CBG adequately controlled; also on solumedrol 60 q12h Lytes: Phos decreasing after bolus 6/2, Na low - unchanged, Mag continues to be less than goal of >2 - despite repeat Mag bolus', K at goal w/ IVF NSk40 at 30ml/hr.  Renal: SCr WNL, UOP decreased to 1 ml/kg/hr.  I/O: +156.8 cc.  Prealbumin rising.  Pulm: RA Cards: BP slightly elevated, on Hctz po with prn enalaprilat (allergy to BB) Hepatobil: Alk phos/LFTs/bili wnl from labs on 5/20. Albumin remains low at 2.  Triglycerides slightly increased, will follow.  Cholesterol noted. Neuro: standing po morphine BID and dilaudid PCA  Heme/Onc: Hx of cervical cancer, plts 5/25 at 240 and now <5  after multiple plt transfusions, tranexamic acid load and infusion. Heme/onc consulted - suspect consumption. Unclear if/when OR procedure possible.  ID: WBC trending down, still elevated, afebrile on levofloxacin; s/p IVIG x 2 doses - 6/1, 6/2.  ID thinks thrombocytopenia maybe d/t beta-lactams-->changed CTX to levofloxacin and continued flagyl.   Rocephin 5/5 >> 5/8; 5/30>>6/3 Cefoxitin 5/14 >> 5/15 Augmentin 5/20 >> 5/21 Zosyn 5/22 >> 5/29 Cipro 5/29 >>5/30 Flagyl 5/29 >> Levofloxacin 6/3  >>  Micro: 5/23 RUQ Abscess: rare viridans strep; B. Fragilis (no sens) 5/23 LLQ Abscess: few E.coli (pan sens) 5/23 LUQ Abscess: multiple bacteria 5/14 MRSA PCR neg 5/6 Urine: neg  Best Practices: SCDs, IV famotidine  Plan:  -Continue Clinimix E 5/20 at goal of 70 ml/hr -Repeat bolus Mag 4 g IV x1; may need additional boluses if no response -NaPhos IV x1 -Continue famotidine in TNA -TE, lipids at 10 ml/hr MWF only 2/2 national shortage -On PO MVI 2/2 national shortage of IV MVI -f/u am Mag, BMP, CBGs, phos  Haynes Hoehn, PharmD 02/25/2012 8:40 AM  Pager: [phone]

## 2012-02-25 NOTE — Progress Notes (Signed)
INFECTIOUS DISEASE PROGRESS NOTE  ID: Vanessa [name] is a 36 y.o. female with   Principal Problem:  *Sigmoid stricture Active Problems:  Cervical cancer  Post-radiation rectal bleeding  Pyelonephritis  Nausea & vomiting  Hypercalcemia  Radiation colitis  Hemorrhage of rectum and anus  Acute posthemorrhagic anemia  Thrombocytopenia  Subjective: C/o nasal packing/clot.   Abtx:  Anti-infectives     Start     Dose/Rate Route Frequency Ordered Stop   02/24/12 1600   levofloxacin (LEVAQUIN) IVPB 750 mg        750 mg 100 mL/hr over 90 Minutes Intravenous Every 24 hours 02/24/12 1526     02/20/12 1415   cefTRIAXone (ROCEPHIN) 1 g in dextrose 5 % 50 mL IVPB  Status:  Discontinued        1 g 100 mL/hr over 30 Minutes Intravenous Every 24 hours 02/20/12 1404 02/24/12 1526   02/19/12 1800   ciprofloxacin (CIPRO) IVPB 400 mg  Status:  Discontinued        400 mg 200 mL/hr over 60 Minutes Intravenous Every 12 hours 02/19/12 1612 02/20/12 1404   02/19/12 1700   metroNIDAZOLE (FLAGYL) IVPB 500 mg        500 mg 100 mL/hr over 60 Minutes Intravenous 3 times per day 02/19/12 1612     02/12/12 0800   piperacillin-tazobactam (ZOSYN) IVPB 3.375 g  Status:  Discontinued        3.375 g 12.5 mL/hr over 240 Minutes Intravenous 3 times per day 02/12/12 0800 02/19/12 1612   02/10/12 1000   amoxicillin-clavulanate (AUGMENTIN) 875-125 MG per tablet 1 tablet  Status:  Discontinued        1 tablet Oral Every 12 hours 02/10/12 0823 02/12/12 0800   02/04/12 1800   cefOXitin (MEFOXIN) 1 g in dextrose 5 % 50 mL IVPB        1 g 100 mL/hr over 30 Minutes Intravenous Every 6 hours 02/04/12 1621 02/05/12 0617   02/04/12 0000   cefOXitin (MEFOXIN) 2 g in dextrose 5 % 50 mL IVPB        2 g 100 mL/hr over 30 Minutes Intravenous 60 min pre-op 02/03/12 0333 02/04/12 1145   02/03/12 0500   cefOXitin (MEFOXIN) 2 g in dextrose 5 % 50 mL IVPB  Status:  Discontinued        2 g 100 mL/hr over 30 Minutes  Intravenous 60 min pre-op 02/02/12 1156 02/03/12 0333   02/03/12 0000   cefOXitin (MEFOXIN) 2 g in dextrose 5 % 50 mL IVPB  Status:  Discontinued        2 g 100 mL/hr over 30 Minutes Intravenous 60 min pre-op 02/02/12 1004 02/02/12 1156   01/27/12 2200   cefTRIAXone (ROCEPHIN) 1 g in dextrose 5 % 50 mL IVPB        1 g 100 mL/hr over 30 Minutes Intravenous Every 24 hours 01/27/12 2152 01/29/12 2359   01/27/12 1915   cefTRIAXone (ROCEPHIN) 1 g in dextrose 5 % 50 mL IVPB        1 g 100 mL/hr over 30 Minutes Intravenous  Once 01/27/12 1914 01/27/12 1957          Medications:  Scheduled:   . acetaminophen  650 mg Oral Once  . alteplase  2 mg Intracatheter Once  . aminocaproic acid  500 mg Oral Q6H  . barrier cream  1 application Topical TID  . folic acid  1 mg Intravenous Daily  . hydrochlorothiazide  12.5 mg Oral Daily  . HYDROmorphone PCA 0.3 mg/mL   Intravenous Q4H  . insulin aspart  0-9 Units Subcutaneous Q4H  . levofloxacin (LEVAQUIN) IV  750 mg Intravenous Q24H  . LORazepam  1 mg Intravenous Once  . magnesium sulfate 1 - 4 g bolus IVPB  4 g Intravenous Once  . magnesium sulfate 1 - 4 g bolus IVPB  4 g Intravenous Once  . methylPREDNISolone (SOLU-MEDROL) injection  60 mg Intravenous Q12H  . metronidazole  500 mg Intravenous Q8H  . morphine  15 mg Oral QHS  . morphine  30 mg Oral Daily  . mulitivitamin with minerals  1 tablet Oral Daily  . oxymetazoline  2 spray Each Nare BID  . sodium chloride 0.9 % 100 mL with tranexamic acid (CYKLOLAPRON) 300 mg infusion   Intravenous Q1H  . sodium chloride  10-40 mL Intracatheter Q12H  . sodium phosphate  Dextrose 5% IVPB  10 mmol Intravenous Once  . tranexamic acid (CYLOKAPRON) IVPB (ORTHO)  750 mg Intravenous Once  . DISCONTD: cefTRIAXone (ROCEPHIN)  IV  1 g Intravenous Q24H  . DISCONTD: famotidine (PEPCID) IV  20 mg Intravenous Q12H  . DISCONTD: magnesium sulfate 1 - 4 g bolus IVPB  4 g Intravenous Once  . DISCONTD: tranexamic acid  (CYKLOKAPRON) infusion ([facility])  2 mg/kg/hr (Order-Specific) Intravenous Once    Objective: Vital signs in last 24 hours: Temp:  [97.4 F (36.3 C)-98.4 F (36.9 C)] 98.4 F (36.9 C) (06/04 0400) Pulse Rate:  [88-139] 99  (06/04 0700) Resp:  [11-30] 19  (06/04 0700) BP: (81-144)/(39-100) 129/84 mmHg (06/04 0700) SpO2:  [41 %-100 %] 100 % (06/04 0700) Weight:  [74.98 kg (165 lb 4.8 oz)] 74.98 kg (165 lb 4.8 oz) (06/04 0400)   General appearance: alert, cooperative and no distress Nose: her L nares has a blood soaked packing. some leakage around. Throat: no evidence of oral bleeding. Resp: clear to auscultation bilaterally Cardio: regular rate and rhythm GI: normal findings: bowel sounds normal and soft, non-tender Extremities: no edema. RUE PIC clean, non-tender.   Lab Results  Basename 02/25/12 0444 02/24/12 2200 02/24/12 0500  WBC 20.3* 21.2* --  HGB 6.9* 7.7* --  HCT 18.6* 21.1* --  NA 131* -- 131*  K 4.5 -- 4.2  CL 101 -- 100  CO2 25 -- 23  BUN 18 -- 19  CREATININE 0.32* -- 0.36*  GLU -- -- --   Liver Panel  Basename 02/24/12 0500  PROT 7.3  ALBUMIN 2.0*  AST 16  ALT 13  ALKPHOS 51  BILITOT 0.9  BILIDIR --  IBILI --   Sedimentation Rate No results found for this basename: ESRSEDRATE in the last 72 hours C-Reactive Protein No results found for this basename: CRP:2 in the last 72 hours  Microbiology: No results found for this or any previous visit (from the past 240 hour(s)).  Studies/Results: No results found.   Assessment/Plan: Abdominal Abscesses  Thrombocytopenia  Day 29 anbx continue flagyl/levaquin  Suspect that her plt drop is from beta-lactams. Will continue to watch.  D/i Dr Trula Slade Infectious Diseases [phone] 02/25/2012, 8:38 AM   LOS: 29 days

## 2012-02-25 NOTE — Progress Notes (Signed)
CRITICAL VALUE ALERT  Critical value received: hgb 6.9  Date of notification: 02/25/2012  Time of notification: 0533  Critical value read back:yes  Nurse who received alert:  Effie Berkshire    MD notified (1st page):  Dr. Sofie Hartigan  Time of first page:  0535  MD notified (2nd page):  Time of second page:  Responding MD:  Dr. Sofie Hartigan   Time MD responded:  [phone]

## 2012-02-25 NOTE — Progress Notes (Signed)
Name: Vanessa [name] MRN: [id number] DOB: [birth year]    LOS: 29  Referring Provider:  [facility]  Reason for Referral:  Thrombocytopenia    PULMONARY / CRITICAL CARE MEDICINE  Brief patient description:  36 y/o F with Hx of cervical cancer s/p chemo / radiation (2012), now with abdominal adhesions / abscess and profound thrombocytopenia -not responsive to infusion.  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  Fixed sigmoid with sharp angulation 5/14 Colon resection with sigmoid / diverting loop ileostomy 5/23 Perc Drain by IR of abd abscesses 5/31 PICC line placed Received PRBC 6/2, 6/3  Subjective/Interval events:  Remains stable but critical Still receiving IVIg Feels a little better today    Vital Signs: Temp:  [97.4 F (36.3 C)-98.4 F (36.9 C)] 98.4 F (36.9 C) (06/04 0400) Pulse Rate:  [88-139] 99  (06/04 0700) Resp:  [11-30] 19  (06/04 0700) BP: (81-144)/(39-100) 129/84 mmHg (06/04 0700) SpO2:  [41 %-100 %] 100 % (06/04 0700) Weight:  [74.98 kg (165 lb 4.8 oz)] 74.98 kg (165 lb 4.8 oz) (06/04 0400)  Physical Examination: General:  Chronically ill, appears older than age Neuro:  AAOx4, flat affect, no focal deficits HEENT: dry MM, bleeding from nose Cardiovascular:  s1s2 rrr, no m/r/g Lungs:  resp's even/non-labored, lungs bilaterally clear Abdomen:  Round / soft, tender,  3 JP drains, RLQ ileostomy Musculoskeletal:  No obvious deformities Skin:  Warm/dry  Principal Problem:  *Sigmoid stricture Active Problems:  Cervical cancer  Post-radiation rectal bleeding  Pyelonephritis  Nausea & vomiting  Hypercalcemia  Radiation colitis  Hemorrhage of rectum and anus  Acute posthemorrhagic anemia  Thrombocytopenia   ASSESSMENT AND PLAN  PULMONARY No results found for this basename: PHART:5,PCO2:5,PCO2ART:5,PO2ART:5,HCO3:5,O2SAT:5 in the last 168 hours Ventilator Settings:     CXR:  none ETT:  none  A:   No acute issues P:   -supportive care, monitor respiratory status   CARDIOVASCULAR No results found for this basename: TROPONINI:5,LATICACIDVEN:5, O2SATVEN:5,PROBNP:5 in the last 168 hours ECG:   Lines:  RUE PICC 5/31>>>  A:  Hypertensive- well controled P:  -prn IV ACE inhibitor , she is allergic to beta blockers -ICU monitoring  RENAL  Lab 02/25/12 0444 02/24/12 0500 02/23/12 0435 02/22/12 0635 02/21/12 0853  NA 131* 131* 131* 139 134*  K 4.5 4.2 -- -- --  CL 101 100 100 103 98  CO2 25 23 24 26 25   BUN 18 19 12 12 16   CREATININE 0.32* 0.36* 0.40* 0.63 0.82  CALCIUM 10.0 10.1 9.8 9.7 9.5  MG 1.5 1.5 1.6 1.6 --  PHOS 2.2* 2.3 2.0* 2.5 --   Intake/Output      06/03 0701 - 06/04 0700 06/04 0701 - 06/05 0700   P.O. 170    I.V. (mL/kg) 540 (7.2)    Blood 699.3    Other     IV Piggyback 757.5    TPN 1680    Total Intake(mL/kg) 3846.8 (51.3)    Urine (mL/kg/hr) 1705 (0.9)    Drains 35    Stool 1950    Total Output 3690    Net +156.8          Foley:    A:   Hyponatremia Hypophosphatemia  P:   -TNA per Pharmacy  GASTROINTESTINAL  Lab 02/24/12 0500 02/22/12 0635 02/20/12 0330  AST 16 19 16   ALT 13 17 19   ALKPHOS 51 77 103  BILITOT 0.9 0.8 0.9  PROT 7.3 6.1 6.5  ALBUMIN 2.0* 2.6* 2.2*    A:   Abdominal Pain. S/P sigmoid colectomy with primary anastomosis and diverting ileostomy. 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 -WOC for ileostomy  Rectal bleeding- large amt of BRB mixed with large clots am of 6/2 with BM  P:   -continue to monitor -f/u CBC  HEMATOLOGIC  Lab 02/25/12 0444 02/24/12 2200 02/24/12 1523 02/24/12 0500 02/23/12 2200 02/22/12 0635 02/22/12 0634 02/20/12 2356 02/20/12 1140 02/20/12 0330 02/19/12 1712  HGB 6.9* 7.7* 5.1* 7.7* 8.6* -- -- -- -- -- --  HCT 18.6* 21.1* 14.4* 21.1* 23.8* -- -- -- -- --  --  PLT <5* <5* <5* <5* <5* -- -- -- -- -- --  INR -- -- -- -- -- 1.18 1.41 1.26 1.21 1.25 --  APTT -- -- -- -- -- -- 33 -- 36 -- 40*   Hematology panel 6/1: no schitocytes, haptoglogin 154, fibrinogen 410  A:   Refractory Thrombocytopenia, suspect d/t consumption vs. Drug related (cephlosporin)  Cannot r/o antibody induced drop. S/p chemo / radiation for cervical cancer. Last treatment in 10/2010 P: -f/u hematology recs -continue solumedrol, IVIg as ordered -transfuse plts per hematology -continue cyklokapron staretd 6/3 am ([facility]ar not available) -Rocephin d/c'd 6/3 per ID for possible cause of thrombocytopenia    Anemia -secondary to bleeding from nose and rectum 2 u PRBCs 6/3 P: -Nasal packing per ENT -f/u CBC -transfuse for HGB <7 -limit lab draws   INFECTIOUS  Lab 02/25/12 0444 02/24/12 2200 02/24/12 1523 02/24/12 0500 02/23/12 2200  WBC 20.3* 21.2* 28.1* 18.3* 17.8*  PROCALCITON -- -- -- -- --   Cultures: UC 5/6>>>neg 5/13 MRSA PCR>>>neg 5/23 Abd abscess drain>>>multiple organisims 5/23 Abd drainage>>>few e-Coli 5/23 RU abd drain>>>rare viridans, abundant bacteroides fragilis, beta lactam positive 6/2 HIV>>> neg  Antibiotics: Augmentin 5/20>>5/22 Cefoxitin 5/13>>>5/15 Rocephin 5/6>>>5/8, 5/30>>>6/3 Cipro 5/29>>>5/30 Flagyl 5/29>>> Zosyn 5/22>>>5/29 Levaquin 6/3>>>  A:   Abdominal Abscess P:   -abx as above -per [facility] when to go to OR -f/u ID recs  ENDOCRINE  Lab 02/24/12 1700 02/24/12 1528 02/24/12 1315 02/24/12 0858 02/24/12 0324  GLUCAP 150* 143* 160* 149* 124*   A:   Hyperglycemia- well controled  P:   -monitor CBG glucose -Continue SSI  NEUROLOGIC  A:  Anxiety  P:   -supportive care -PRN ativan   BEST PRACTICE / DISPOSITION Level of Care:  ICU Primary Service:  [facility] Consultants:  PCCM, Hematology, GI Code Status:  Full Diet:  Clear DVT Px:  scd's GI Px:  pepcid Skin Integrity:  intact Social / Family: Patient has 8 children.      Levy Pupa, MD, PhD 02/25/2012, 10:55 AM [facility] [phone] or if no answer [phone]

## 2012-02-26 DIAGNOSIS — K651 Peritoneal abscess: Secondary | ICD-10-CM

## 2012-02-26 LAB — CBC
HCT: 20.9 % — ABNORMAL LOW (ref 36.0–46.0)
HCT: 21.3 % — ABNORMAL LOW (ref 36.0–46.0)
Hemoglobin: 7.7 g/dL — ABNORMAL LOW (ref 12.0–15.0)
Hemoglobin: 8.3 g/dL — ABNORMAL LOW (ref 12.0–15.0)
Hemoglobin: 8.9 g/dL — ABNORMAL LOW (ref 12.0–15.0)
MCH: 31.2 pg (ref 26.0–34.0)
MCH: 30.4 pg (ref 26.0–34.0)
MCH: 30.2 pg (ref 26.0–34.0)
MCHC: 36.8 g/dL — ABNORMAL HIGH (ref 30.0–36.0)
MCHC: 36.2 g/dL — ABNORMAL HIGH (ref 30.0–36.0)
MCV: 85.2 fL (ref 78.0–100.0)
MCV: 83.3 fL (ref 78.0–100.0)
Platelets: <5 10*3/uL — CL (ref 150–400)
Platelets: <5 10*3/uL — CL (ref 150–400)
RBC: 2.5 MIL/uL — ABNORMAL LOW (ref 3.87–5.11)
WBC: 24 10*3/uL — ABNORMAL HIGH (ref 4.0–10.5)

## 2012-02-26 LAB — GLUCOSE, CAPILLARY
Glucose-Capillary: 128 mg/dL — ABNORMAL HIGH (ref 70–99)
Glucose-Capillary: 129 mg/dL — ABNORMAL HIGH (ref 70–99)
Glucose-Capillary: 130 mg/dL — ABNORMAL HIGH (ref 70–99)
Glucose-Capillary: 586 mg/dL (ref 70–99)

## 2012-02-26 LAB — DIC (DISSEMINATED INTRAVASCULAR COAGULATION)PANEL
D-Dimer, Quant: 2.34 ug/mL-FEU — ABNORMAL HIGH (ref 0.00–0.48)
INR: 1.39 (ref 0.00–1.49)
Platelets: <5 10*3/uL — CL (ref 150–400)
aPTT: 28 seconds (ref 24–37)

## 2012-02-26 LAB — PREPARE PLATELET PHERESIS

## 2012-02-26 LAB — BASIC METABOLIC PANEL
BUN: 13 mg/dL (ref 6–23)
CO2: 26 mEq/L (ref 19–32)
CO2: 27 mEq/L (ref 19–32)
Calcium: 10.5 mg/dL (ref 8.4–10.5)
Chloride: 98 mEq/L (ref 96–112)
Creatinine, Ser: 0.33 mg/dL — ABNORMAL LOW (ref 0.50–1.10)
Creatinine, Ser: 0.35 mg/dL — ABNORMAL LOW (ref 0.50–1.10)
Glucose, Bld: 124 mg/dL — ABNORMAL HIGH (ref 70–99)
Sodium: 130 mEq/L — ABNORMAL LOW (ref 135–145)

## 2012-02-26 LAB — OCCULT BLOOD X 1 CARD TO LAB, STOOL: Fecal Occult Bld: POSITIVE

## 2012-02-26 MED ORDER — SODIUM CHLORIDE 0.9 % IV SOLN
INTRAVENOUS | Status: DC | PRN
  Administered 2012-02-28: via INTRAVENOUS
  Administered 2012-03-02 – 2012-03-04 (×2): 20 mL/h via INTRAVENOUS
  Administered 2012-03-07: 13:00:00 via INTRAVENOUS

## 2012-02-26 MED ORDER — BOOST / RESOURCE BREEZE PO LIQD
1.0000 | Freq: Two times a day (BID) | ORAL | Status: DC
  Administered 2012-02-27: 1 via ORAL

## 2012-02-26 MED ORDER — SODIUM CHLORIDE 0.9 % IV SOLN
INTRAVENOUS | Status: DC
  Administered 2012-02-26 – 2012-02-29 (×5): via INTRAVENOUS
  Filled 2012-02-26 (×9): qty 1000

## 2012-02-26 MED ORDER — HYDRALAZINE HCL 20 MG/ML IJ SOLN
10.0000 mg | Freq: Four times a day (QID) | INTRAMUSCULAR | Status: DC | PRN
  Filled 2012-02-26: qty 0.5

## 2012-02-26 MED ORDER — SODIUM CHLORIDE 0.9 % IV SOLN
INTRAVENOUS | Status: DC
  Filled 2012-02-26 (×2): qty 1000

## 2012-02-26 MED ORDER — ZINC TRACE METAL 1 MG/ML IV SOLN
INTRAVENOUS | Status: DC
  Administered 2012-02-26: 18:00:00 via INTRAVENOUS
  Filled 2012-02-26: qty 2000

## 2012-02-26 MED ORDER — FAT EMULSION 20 % IV EMUL
240.0000 mL | INTRAVENOUS | Status: DC
  Filled 2012-02-26: qty 250

## 2012-02-26 MED ORDER — AMINOCAPROIC ACID 500 MG PO TABS
1.0000 g | ORAL_TABLET | Freq: Four times a day (QID) | ORAL | Status: DC
  Administered 2012-02-26 – 2012-02-27 (×5): 1 g via ORAL
  Filled 2012-02-26 (×8): qty 2

## 2012-02-26 NOTE — Progress Notes (Addendum)
PARENTERAL NUTRITION CONSULT NOTE - FOLLOW UP  Pharmacy Consult for TPN Indication: intraabdominal abscess, ileus  Allergies  Allergen Reactions  . Labetalol Hcl Itching and Other (See Comments)    Bumps to bilateral arms.  . Morphine And Related Hives    Patient Measurements: Height: 5\' 4"  (162.6 cm) Weight: 164 lb 3.2 oz (74.481 kg) IBW/kg (Calculated) : 54.7   Vital Signs: Temp: 97.9 F (36.6 C) (06/05 0657) Temp src: Oral (06/05 0642) BP: 117/88 mmHg (06/05 0700) Pulse Rate: 101  (06/05 0700) Intake/Output from previous day: 06/04 0701 - 06/05 0700 In: 6839 [P.O.:576; I.V.:1380; OZDGU:4403; IV Piggyback:902; TPN:1710] Out: 4311 [Urine:3850; Drains:408; Stool:53] Intake/Output from this shift:    Labs:  [facility] 02/26/12 0524 02/26/12 0400 02/25/12 2339 02/25/12 1615  WBC 23.6* -- 24.0* 31.3*  HGB 7.8* -- 7.7* 6.2*  HCT 21.3* -- 20.9* 17.1*  PLT <5* <5* <5* --  APTT -- 28 -- --  INR -- 1.39 -- --     Basename 02/26/12 0524 02/26/12 0402 02/25/12 0444 02/24/12 0500  NA 130* 127* 131* --  K 4.3 5.9* 4.5 --  CL 98 98 101 --  CO2 27 26 25  --  GLUCOSE 124* 540* 177* --  BUN 14 13 18  --  CREATININE 0.35* 0.33* 0.32* --  LABCREA -- -- -- --  CREAT24HRUR -- -- -- --  CALCIUM 10.5 10.0 10.0 --  MG -- 1.5 1.5 1.5  PHOS -- 3.2 2.2* 2.3  PROT -- -- -- 7.3  ALBUMIN -- -- -- 2.0*  AST -- -- -- 16  ALT -- -- -- 13  ALKPHOS -- -- -- 51  BILITOT -- -- -- 0.9  BILIDIR -- -- -- --  IBILI -- -- -- --  PREALBUMIN -- -- -- 17.5*  TRIG -- -- -- 126  CHOLHDL -- -- -- --  CHOL -- -- -- 72   Estimated Creatinine Clearance: 96.1 ml/min (by C-G formula based on Cr of 0.35).    Basename 02/26/12 0413 02/26/12 0015 02/26/12 0013  GLUCAP 140* 129* 586*    Medications:  Scheduled:     . acetaminophen  650 mg Oral Once  . acetaminophen  650 mg Oral Once  . alteplase  2 mg Intracatheter Once  . aminocaproic acid  1 g Oral QID  . barrier cream  1 application Topical  TID  . folic acid  1 mg Intravenous Daily  . hydrochlorothiazide  12.5 mg Oral Daily  . HYDROmorphone PCA 0.3 mg/mL   Intravenous Q4H  . insulin aspart  0-9 Units Subcutaneous Q4H  . levofloxacin (LEVAQUIN) IV  750 mg Intravenous Q24H  . magnesium sulfate 1 - 4 g bolus IVPB  4 g Intravenous Once  . methylPREDNISolone (SOLU-MEDROL) injection  60 mg Intravenous Q12H  . metronidazole  500 mg Intravenous Q8H  . morphine  15 mg Oral QHS  . morphine  30 mg Oral Daily  . mulitivitamin with minerals  1 tablet Oral Daily  . oxymetazoline  2 spray Each Nare BID  . sodium chloride  10-40 mL Intracatheter Q12H  . sodium phosphate  Dextrose 5% IVPB  10 mmol Intravenous Once  . DISCONTD: aminocaproic acid  500 mg Oral QID  . DISCONTD: aminocaproic acid  500 mg Oral Q6H  . DISCONTD: aminocaproic acid  500 mg Oral QID   Infusions:     . fat emulsion 250 mL (02/24/12 1712)  . sodium chloride 0.9 % with kcl Pediatric IV fluid    .  TPN (CLINIMIX) +/- additives 70 mL/hr at 02/24/12 1712  . TPN (CLINIMIX) +/- additives 70 mL/hr at 02/25/12 1707  . DISCONTD: 0.9 % sodium chloride with kcl 80 mL/hr at 02/25/12 0600    Insulin Requirements in the past 24 hours:  3 units SSI with NO insulin in TNA with q4h sensitive SSI  Current Nutrition:  Clinimix E5/20 at 70 ml/hr (at goal)  Nutritional Goals:  1800-2000 kCal, 75-85 grams of protein per day Clinimix E5/20 goal rate of 61ml/hr will provide an average of 1684 kcal and 84g protein per day based on RD assessment (94% total kcal and 100% protein needs)  Assessment: 69 yof with a history of cervical cancer who is s/p sigmoid colectomy and diverting ileostomy on 5/14. Developed multiple intra-abdominal abscesses 2/2 anastomotic leak shown on CT and had 3 perc drains placed. On 5/29 patient had multiple blood BM, hemoptysis, and abdominal pain. Plts <5 (240k on 5/25). CT 5/30 showed additional abscesses and concern for fistula formation.   Anticoag:   Thrombocytopenia (plts <5) and anemia with bloody BMs and epistaxis. ID suspects plt drop from beta-lactams.   S/p platelets (5/29: 1 unit, 5/30: 3 units, 5/31: 4 units, 6/1: 1 unit, 6/4: 2units), PRBC (5/29:2 units, 5/31: 2 units, 6/1: 2 units, 6/2: 3 units, 6/3: 2 units, 6/4: 3 units, 6/5: 2 units), FFP (5/30: 2 units, 6/1: 2 units), and tranexamic acid (6/3: 10mg /kg IV load, then 2mg /kg/hr x 2 hours) GI: s/p sigmoid colectomy and loop ileostomy (5/14). Anastomotic breakdown leading to many intra-abdominal abscesses and concern for fistula on recent CT 5/30. Perc drains in place with 64ml/24hr drainage. Pt may need more drains placed but unable to take to surgery with low plt count. Pt with significant amount of hemoptysis/hematochezia. Unknown GI absorption. Endo: No hx DM. CBG adequately controlled; Steroids d/c'd 6/5.  Isolated BG 586 contaminated from TPN line - redraw CBG normal@ 129.  Will continue SSI and follow cBGs to assess further need for insulin.   Lytes: 1st BMET 6/5 drawn from TPN line and contaminated.  Phos/mag not redrawn so unclear accuracy. Phos showed increase after 6/4 bolus. Mag continues to be less than goal of >2 - despite repeat Mag bolus' - will recheck again w/ am labs  Na remains low, repeat K normal @4 .3. On NS w/19mEqK @80ml /hr.  CorrCa elevated 12.1 Renal: SCr WNL, UOP 2.2 ml/kg/hr.  I/O: +2600 (received several blood product infusions).  Prealbumin rising.  Pulm: RA Cards: BP slightly elevated, on Hctz po with prn enalaprilat (allergy to BB) Hepatobil: Alk phos/LFTs/bili wnl from labs on 5/20. Albumin remains low at 2.  Triglycerides slightly increased, will follow.  Cholesterol noted. Neuro: standing po morphine BID and dilaudid PCA  Heme/Onc: Hx of cervical cancer, plts 5/25 at 240 and now <5  after multiple plt transfusions, tranexamic acid load and infusion. Hgb marginally improved after PRBCs 6/4 and 2 units planned 6/5.  Heme/onc consulted - suspect consumption.  Unclear if/when OR procedure possible -plan to wait in hopes of plts rising >50K. Nplate weekly (6/1) and PO amicar started 6/4.   ID: WBC trending down, still elevated, afebrile on levofloxacin; s/p IVIG x 2 doses - 6/1, 6/2.  ID thinks thrombocytopenia may be d/t beta-lactams-->changed CTX to levofloxacin and continued flagyl.   Rocephin 5/5 >> 5/8; 5/30>>6/3 Cefoxitin 5/14 >> 5/15 Augmentin 5/20 >> 5/21 Zosyn 5/22 >> 5/29 Cipro 5/29 >>5/30 Flagyl 5/29 >> Levofloxacin 6/3 >>  Micro: 5/23 RUQ Abscess: rare viridans strep; B. Fragilis (  no sens) 5/23 LLQ Abscess: few E.coli (pan sens) 5/23 LUQ Abscess: multiple bacteria 5/14 MRSA PCR neg 5/6 Urine: neg  Best Practices: SCDs, IV famotidine  Plan:  -Continue Clinimix E 5/20 at goal of 70 ml/hr -Continue famotidine in TNA -TE MWF only 2/2 Sport and exercise psychologist.  Fat emulsion omitted d/t to chance of worsening thrombocytopenia.  -On PO MVI 2/2 national shortage of IV MVI -f/u am Mag, BMP, CBGs, phos  Haynes Hoehn, PharmD 02/26/2012 7:41 AM  Pager: [phone]

## 2012-02-26 NOTE — Progress Notes (Signed)
22 Days Post-Op  Subjective: Hemodynamically stable. Awake and alert. No nausea. Lower abdominal pain stable, mild per patient. Dark stool per her ileostomy. Did not get much sleep last night.  Does continue to have some bleeding from her left nares and rectum.Marland Kitchen Hemoglobin 7.8 this morning. 2 units PRBC ordered by Dr. Janee Morn. PT, PTT, fibrinogen basically normal yesterday. D-dimer elevated 2.34.  Objective: Vital signs in last 24 hours: Temp:  [97.6 F (36.4 C)-98.7 F (37.1 C)] 97.9 F (36.6 C) (06/05 0657) Pulse Rate:  [39-187] 101  (06/05 0700) Resp:  [9-28] 19  (06/05 0700) BP: (113-147)/(61-126) 117/88 mmHg (06/05 0700) SpO2:  [59 %-100 %] 100 % (06/05 0700) Weight:  [164 lb 3.2 oz (74.481 kg)] 164 lb 3.2 oz (74.481 kg) (06/05 0500) Last BM Date: 02/26/12  Intake/Output from previous day: 06/04 0701 - 06/05 0700 In: 6839 [P.O.:576; I.V.:1380; Blood:2241; IV Piggyback:902; TPN:1710] Out: 4311 [Urine:3850; Drains:408; Stool:53] Intake/Output this shift:    General appearance: alert. Appropriate. Mental status normal. Appears fatigued. In no distress. GI: uupper abdomen completely soft and nontender. Lower abdomen soft but tender diffusely but no peritoneal signs. Ileostomy healthy with dark stool. 1 draining bloody one drain purulent.  Lab Results:  Results for orders placed during the hospital encounter of 01/27/12 (from the past 24 hour(s))  GLUCOSE, CAPILLARY     Status: Abnormal   Collection Time   02/25/12  8:38 AM      Component Value Range   Glucose-Capillary 131 (*) 70 - 99 (mg/dL)  PREPARE RBC (CROSSMATCH)     Status: Normal   Collection Time   02/25/12  9:00 AM      Component Value Range   Order Confirmation ORDER PROCESSED BY BLOOD BANK    PREPARE PLATELET PHERESIS     Status: Normal (Preliminary result)   Collection Time   02/25/12  9:00 AM      Component Value Range   Unit Number [id number]     Blood Component Type PLTPHER LR2     Unit division 00     Status  of Unit ISSUED     Transfusion Status OK TO TRANSFUSE    CBC     Status: Abnormal   Collection Time   02/25/12 11:29 AM      Component Value Range   WBC 27.2 (*) 4.0 - 10.5 (K/uL)   RBC 1.88 (*) 3.87 - 5.11 (MIL/uL)   Hemoglobin 5.9 (*) 12.0 - 15.0 (g/dL)   HCT 40.9 (*) 81.1 - 46.0 (%)   MCV 85.1  78.0 - 100.0 (fL)   MCH 31.4  26.0 - 34.0 (pg)   MCHC 36.9 (*) 30.0 - 36.0 (g/dL)   RDW 91.4  78.2 - 95.6 (%)   Platelets <5 (*) 150 - 400 (K/uL)  GLUCOSE, CAPILLARY     Status: Abnormal   Collection Time   02/25/12 11:31 AM      Component Value Range   Glucose-Capillary 129 (*) 70 - 99 (mg/dL)  CBC     Status: Abnormal   Collection Time   02/25/12  4:15 PM      Component Value Range   WBC 31.3 (*) 4.0 - 10.5 (K/uL)   RBC 2.03 (*) 3.87 - 5.11 (MIL/uL)   Hemoglobin 6.2 (*) 12.0 - 15.0 (g/dL)   HCT 21.3 (*) 08.6 - 46.0 (%)   MCV 84.2  78.0 - 100.0 (fL)   MCH 30.5  26.0 - 34.0 (pg)   MCHC 36.3 (*) 30.0 - 36.0 (  g/dL)   RDW 09.6  04.5 - 40.9 (%)   Platelets <5 (*) 150 - 400 (K/uL)  GLUCOSE, CAPILLARY     Status: Abnormal   Collection Time   02/25/12  4:17 PM      Component Value Range   Glucose-Capillary 126 (*) 70 - 99 (mg/dL)  PREPARE PLATELET PHERESIS     Status: Normal (Preliminary result)   Collection Time   02/25/12  5:30 PM      Component Value Range   Unit Number [id number]     Blood Component Type PLTPHER LR1     Unit division 00     Status of Unit REL FROM [facility]     Transfusion Status OK TO TRANSFUSE     Unit Number [id number]     Blood Component Type PLTPHER LR1     Unit division 00     Status of Unit ISSUED     Transfusion Status OK TO TRANSFUSE    GLUCOSE, CAPILLARY     Status: Abnormal   Collection Time   02/25/12  8:03 PM      Component Value Range   Glucose-Capillary 122 (*) 70 - 99 (mg/dL)  CBC     Status: Abnormal   Collection Time   02/25/12 11:39 PM      Component Value Range   WBC 24.0 (*) 4.0 - 10.5 (K/uL)   RBC 2.48 (*) 3.87 - 5.11 (MIL/uL)   Hemoglobin 7.7  (*) 12.0 - 15.0 (g/dL)   HCT 65.7 (*) 84.6 - 46.0 (%)   MCV 84.3  78.0 - 100.0 (fL)   MCH 31.0  26.0 - 34.0 (pg)   MCHC 36.8 (*) 30.0 - 36.0 (g/dL)   RDW 96.2  95.2 - 84.1 (%)   Platelets <5 (*) 150 - 400 (K/uL)  GLUCOSE, CAPILLARY     Status: Abnormal   Collection Time   02/26/12 12:13 AM      Component Value Range   Glucose-Capillary 586 (*) 70 - 99 (mg/dL)   Comment 1 Documented in Chart     Comment 2 Notify RN    GLUCOSE, CAPILLARY     Status: Abnormal   Collection Time   02/26/12 12:15 AM      Component Value Range   Glucose-Capillary 129 (*) 70 - 99 (mg/dL)   Comment 1 Repeat Test    DIC (DISSEMINATED INTRAVASCULAR COAGULATION) PANEL     Status: Abnormal   Collection Time   02/26/12  4:00 AM      Component Value Range   Prothrombin Time 17.3 (*) 11.6 - 15.2 (seconds)   INR 1.39  0.00 - 1.49    aPTT 28  24 - 37 (seconds)   Fibrinogen 228  204 - 475 (mg/dL)   D-Dimer, Quant 3.24 (*) 0.00 - 0.48 (ug/mL-FEU)   Platelets <5 (*) 150 - 400 (K/uL)   Smear Review NO SCHISTOCYTES SEEN    BASIC METABOLIC PANEL     Status: Abnormal   Collection Time   02/26/12  4:02 AM      Component Value Range   Sodium 127 (*) 135 - 145 (mEq/L)   Potassium 5.9 (*) 3.5 - 5.1 (mEq/L)   Chloride 98  96 - 112 (mEq/L)   CO2 26  19 - 32 (mEq/L)   Glucose, Bld 540 (*) 70 - 99 (mg/dL)   BUN 13  6 - 23 (mg/dL)   Creatinine, Ser 4.01 (*) 0.50 - 1.10 (mg/dL)   Calcium 02.7  8.4 -  10.5 (mg/dL)   GFR calc non Af Amer >90  >90 (mL/min)   GFR calc Af Amer >90  >90 (mL/min)  MAGNESIUM     Status: Normal   Collection Time   02/26/12  4:02 AM      Component Value Range   Magnesium 1.5  1.5 - 2.5 (mg/dL)  PHOSPHORUS     Status: Normal   Collection Time   02/26/12  4:02 AM      Component Value Range   Phosphorus 3.2  2.3 - 4.6 (mg/dL)  GLUCOSE, CAPILLARY     Status: Abnormal   Collection Time   02/26/12  4:13 AM      Component Value Range   Glucose-Capillary 140 (*) 70 - 99 (mg/dL)   Comment 1 Notify RN      Comment 2 Documented in Chart    BASIC METABOLIC PANEL     Status: Abnormal   Collection Time   02/26/12  5:24 AM      Component Value Range   Sodium 130 (*) 135 - 145 (mEq/L)   Potassium 4.3  3.5 - 5.1 (mEq/L)   Chloride 98  96 - 112 (mEq/L)   CO2 27  19 - 32 (mEq/L)   Glucose, Bld 124 (*) 70 - 99 (mg/dL)   BUN 14  6 - 23 (mg/dL)   Creatinine, Ser 1.61 (*) 0.50 - 1.10 (mg/dL)   Calcium 09.6  8.4 - 10.5 (mg/dL)   GFR calc non Af Amer >90  >90 (mL/min)   GFR calc Af Amer >90  >90 (mL/min)  CBC     Status: Abnormal   Collection Time   02/26/12  5:24 AM      Component Value Range   WBC 23.6 (*) 4.0 - 10.5 (K/uL)   RBC 2.50 (*) 3.87 - 5.11 (MIL/uL)   Hemoglobin 7.8 (*) 12.0 - 15.0 (g/dL)   HCT 04.5 (*) 40.9 - 46.0 (%)   MCV 85.2  78.0 - 100.0 (fL)   MCH 31.2  26.0 - 34.0 (pg)   MCHC 36.6 (*) 30.0 - 36.0 (g/dL)   RDW 81.1  91.4 - 78.2 (%)   Platelets <5 (*) 150 - 400 (K/uL)     Studies/Results: @RISRSLT24 @     . acetaminophen  650 mg Oral Once  . acetaminophen  650 mg Oral Once  . alteplase  2 mg Intracatheter Once  . aminocaproic acid  1 g Oral QID  . barrier cream  1 application Topical TID  . folic acid  1 mg Intravenous Daily  . hydrochlorothiazide  12.5 mg Oral Daily  . HYDROmorphone PCA 0.3 mg/mL   Intravenous Q4H  . insulin aspart  0-9 Units Subcutaneous Q4H  . levofloxacin (LEVAQUIN) IV  750 mg Intravenous Q24H  . magnesium sulfate 1 - 4 g bolus IVPB  4 g Intravenous Once  . methylPREDNISolone (SOLU-MEDROL) injection  60 mg Intravenous Q12H  . metronidazole  500 mg Intravenous Q8H  . morphine  15 mg Oral QHS  . morphine  30 mg Oral Daily  . mulitivitamin with minerals  1 tablet Oral Daily  . oxymetazoline  2 spray Each Nare BID  . sodium chloride  10-40 mL Intracatheter Q12H  . sodium phosphate  Dextrose 5% IVPB  10 mmol Intravenous Once  . DISCONTD: aminocaproic acid  500 mg Oral QID  . DISCONTD: aminocaproic acid  500 mg Oral Q6H  . DISCONTD: aminocaproic  acid  500 mg Oral QID     Assessment/Plan:  s/p Procedure(s): COLON RESECTION SIGMOID  Status post sigmoid colectomy with EEA stapled anastomosis and diverting loop ileostomy. This has been complicated by pelvic abscess presumably due to anastomotic disruption.  Surgical decision making is complex. She needs a laparotomy and washout, take down of her anastomosis and colostomy. This will be difficult and bloody under normal circumstances. In the presence of profound thrombocytopenia will be life-threatening.  Decisions will continue to be day by day. The patient continues to show no evidence of sepsis or organ failure. Consultants continued to believe that the thrombocytopenia is drug-related and will resolve.  So long as the patient remains  a stable and shows no evidence of sepsis, I would favor waiting in hopes that this eventual  laparotomy can be performed under normal coagulation setting with platelet count greater than 50,000. If she becomes septic or deteriorates in anyway we will be forced to operate sooner with high mortality.  High risk ICH. Follow neuro exam closely.    LOS: 30 days    Reathel Turi M. Derrell Lolling, M.D., [facility] General and Minimally invasive Surgery Breast and Colorectal Surgery Office:   [phone] Pager:   [phone]  02/26/2012  . .prob

## 2012-02-26 NOTE — Consult Note (Signed)
WOC ostomy follow up consult:  Stoma type/location: Right upper quadrant ileostomy Stomal assessment/size:Red and moist, raised above skin level, oval shape. Peristomal assessment: Intact with minimal partial thickness skin irritation from 7 to 8 o' clock Treatment options for stomal/peristomal skin: skin barrier applied  Output 50 cc black tarry stool emptied, nurse notified, foul odor noted. Ostomy pouching: 2pc pouch applied without difficulty. Education provided:  Informed routine pouch change.  Very lethargic and weak.  States "They told me I couldn't change it."  Unable to assist or remain awake during change.  Family at beside.  Supplies ordered for bedside nurse use.  Reynold Bowen RN, BSN, WOC Nurse/ Thank-you  [place], Charity fundraiser, MSN, [employer]  [phone]

## 2012-02-26 NOTE — Progress Notes (Signed)
Nutrition Follow-up  Patient now with abdominal adhesions/abscess and profound thrombocytopenia, not responsive to infusion.    Per RN, patient did not drink/eat anything on her breakfast tray this morning, wants solid food.  Has been drinking some Gatorade.    Diet Order:  Clear Liquids  Patient is receiving TPN with Clinimix E 5/20 @ 70 ml/hr.  Multivitamins, and trace elements are provided 3 times weekly (MWF) due to national backorder.  Lipids are not being given in an effort to help prevent platelets from dropping further.  Provides 1478 kcal and 84 grams protein daily.  Meets 82% minimum estimated kcal and 100% minimum estimated protein needs.   Meds: Scheduled Meds:    . acetaminophen  650 mg Oral Once  . alteplase  2 mg Intracatheter Once  . aminocaproic acid  1 g Oral Q6H  . barrier cream  1 application Topical TID  . folic acid  1 mg Intravenous Daily  . HYDROmorphone PCA 0.3 mg/mL   Intravenous Q4H  . insulin aspart  0-9 Units Subcutaneous Q4H  . magnesium sulfate 1 - 4 g bolus IVPB  4 g Intravenous Once  . morphine  15 mg Oral QHS  . morphine  30 mg Oral Daily  . mulitivitamin with minerals  1 tablet Oral Daily  . oxymetazoline  2 spray Each Nare BID  . sodium chloride  10-40 mL Intracatheter Q12H  . sodium phosphate  Dextrose 5% IVPB  10 mmol Intravenous Once  . DISCONTD: aminocaproic acid  1 g Oral QID  . DISCONTD: aminocaproic acid  500 mg Oral QID  . DISCONTD: hydrochlorothiazide  12.5 mg Oral Daily  . DISCONTD: levofloxacin (LEVAQUIN) IV  750 mg Intravenous Q24H  . DISCONTD: methylPREDNISolone (SOLU-MEDROL) injection  60 mg Intravenous Q12H  . DISCONTD: metronidazole  500 mg Intravenous Q8H   Continuous Infusions:    . fat emulsion 250 mL (02/24/12 1712)  . 0.9 % sodium chloride with kcl 80 mL/hr at 02/26/12 1100  . TPN (CLINIMIX) +/- additives 70 mL/hr at 02/24/12 1712  . TPN (CLINIMIX) +/- additives 70 mL/hr at 02/25/12 1707  . TPN (CLINIMIX) +/- additives     . DISCONTD: fat emulsion    . DISCONTD: 0.9 % sodium chloride with kcl 80 mL/hr at 02/25/12 0600  . DISCONTD: sodium chloride 0.9 % with kcl Pediatric IV fluid     PRN Meds:.acetaminophen, alum & mag hydroxide-simeth, diphenhydrAMINE, diphenhydrAMINE, hydrALAZINE, HYDROmorphone (DILAUDID) injection, LORazepam, naloxone, ondansetron (ZOFRAN) IV, ondansetron (ZOFRAN) IV, ondansetron, oxyCODONE-acetaminophen, simethicone, sodium chloride, sodium chloride, zolpidem, DISCONTD: enalaprilat  Labs:  CMP     Component Value Date/Time   NA 130* 02/26/2012 0524   K 4.3 02/26/2012 0524   CL 98 02/26/2012 0524   CO2 27 02/26/2012 0524   GLUCOSE 124* 02/26/2012 0524   BUN 14 02/26/2012 0524   CREATININE 0.35* 02/26/2012 0524   CALCIUM 10.5 02/26/2012 0524   CALCIUM 10.7* 02/01/2012 0705   PROT 7.3 02/24/2012 0500   ALBUMIN 2.0* 02/24/2012 0500   AST 16 02/24/2012 0500   ALT 13 02/24/2012 0500   ALKPHOS 51 02/24/2012 0500   BILITOT 0.9 02/24/2012 0500   GFRNONAA >90 02/26/2012 0524   GFRAA >90 02/26/2012 0524    CBG (last 3)   Basename 02/26/12 0841 02/26/12 0413 02/26/12 0015  GLUCAP 151* 140* 129*      Intake/Output Summary (Last 24 hours) at 02/26/12 1159 Last data filed at 02/26/12 1030  Gross per 24 hour  Intake 6447.5 ml  Output   4412 ml  Net 2035.5 ml    Weight Status:  74.5 kg (163.9 lb) stable  Re-estimated needs:  1800-2000 kcals, 80-95 grams protein daily  Nutrition Dx:  Inadequate oral intake, ongoing.  Goal:  Intake to meet >90% of estimated nutrition needs, unmet.  Unable to meet nutrition needs due to lipid shortage and inability to give lipids due to possible side effect of drop in platelets.  Intervention:  Add Resource Breeze PO BID to maximize oral intake.  Monitor:  PO intake as diet is advanced, labs, weight trend, TPN tolerance.   Hettie Holstein Pager #:  [phone]

## 2012-02-26 NOTE — Progress Notes (Signed)
Patient ID: Vanessa [name], female   DOB: [birth year], 36 y.o.   MRN: [id number] Notified by RN of patient passing blood PR.  Hb this AM is 7.8 but patient just passed more brown liquid with clots now. HR 120, BP 122/88. I will order 2u PRBC now and I will discuss with Dr. Derrell Lolling. Violeta Gelinas, MD, MPH, FACS Pager: [phone]

## 2012-02-26 NOTE — Progress Notes (Signed)
PCCM Interval Note  I have reviewed case with Drs Darrold Span and Derrell Lolling.  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 Has failed Amicar, steroids, IVIg. All likely drugs that associate w thrombocytopenia have been d/c'd, but she remains on some that have low-incidence (~1%), including levofloxacin and flagyl. She not showing signs of sepsis, but is high risk. She continues to have significant GI blood losses and she cannot go to sgy with Plt<5000.  I have decide to stop ALL meds that can cause thrombocytopenia in hope that she will start to recover plts if this is a drug rxn. This means that she will be off abx with known peritoneal infection. If she starts to become septic or decompensates in some way, then I believe she will need to go to the OR despite her thrombocytopenia and clear high risk.   Levy Pupa, MD, PhD 02/26/2012, 12:56 PM [facility] [phone] or if no answer [phone]

## 2012-02-26 NOTE — Progress Notes (Signed)
Subjective: Pt resting comfortably in bed. She continues to have blood oozing around her left nasal packing.   Objective: Vital signs in last 24 hours: Temp:  [97.6 F (36.4 C)-98.7 F (37.1 C)] 97.9 F (36.6 C) (06/05 0657) Pulse Rate:  [39-187] 101  (06/05 0700) Resp:  [9-28] 19  (06/05 0700) BP: (113-147)/(61-126) 117/88 mmHg (06/05 0700) SpO2:  [59 %-100 %] 100 % (06/05 0700) Weight:  [74.481 kg (164 lb 3.2 oz)] 74.481 kg (164 lb 3.2 oz) (06/05 0500)  Physical Exam  Constitutional: Well-developed and well-nourished. No distress.  Head: Normocephalic and atraumatic.  Ears: External ears normal.  Nose: Slight oozing around the left nostril. Left AP Merocel packing in place. Septum midline.  Mouth/Throat: Oropharynx with some blood clots.   Eyes: Conjunctivae normal. Pupils are equal, round, and reactive to light.  Neck: Normal range of motion. Neck supple.    Basename 02/26/12 0524 02/26/12 0400 02/25/12 2339  WBC 23.6* -- 24.0*  HGB 7.8* -- 7.7*  HCT 21.3* -- 20.9*  PLT <5* <5* --    Basename 02/26/12 0524 02/26/12 0402  NA 130* 127*  K 4.3 5.9*  CL 98 98  CO2 27 26  GLUCOSE 124* 540*  BUN 14 13  CREATININE 0.35* 0.33*  CALCIUM 10.5 10.0    Medications:  I have reviewed the patient's current medications. Scheduled:   . acetaminophen  650 mg Oral Once  . acetaminophen  650 mg Oral Once  . alteplase  2 mg Intracatheter Once  . aminocaproic acid  1 g Oral QID  . barrier cream  1 application Topical TID  . folic acid  1 mg Intravenous Daily  . hydrochlorothiazide  12.5 mg Oral Daily  . HYDROmorphone PCA 0.3 mg/mL   Intravenous Q4H  . insulin aspart  0-9 Units Subcutaneous Q4H  . levofloxacin (LEVAQUIN) IV  750 mg Intravenous Q24H  . magnesium sulfate 1 - 4 g bolus IVPB  4 g Intravenous Once  . methylPREDNISolone (SOLU-MEDROL) injection  60 mg Intravenous Q12H  . metronidazole  500 mg Intravenous Q8H  . morphine  15 mg Oral QHS  . morphine  30 mg Oral Daily    . mulitivitamin with minerals  1 tablet Oral Daily  . oxymetazoline  2 spray Each Nare BID  . sodium chloride  10-40 mL Intracatheter Q12H  . sodium phosphate  Dextrose 5% IVPB  10 mmol Intravenous Once  . DISCONTD: aminocaproic acid  500 mg Oral QID  . DISCONTD: aminocaproic acid  500 mg Oral Q6H  . DISCONTD: aminocaproic acid  500 mg Oral QID    Assessment/Plan: No significant change. Left anterior and posterior epistaxis secondary to severe thrombocytopenia. The left nasal cavity is packed with an AP merocel packing. In light of her severe thrombocytopenia, she will likely have persistent oozing around the packing. May need platelet transfusions prn. Will follow.    LOS: 30 days   Amron Guerrette,SUI W 02/26/2012, 8:26 AM

## 2012-02-26 NOTE — Progress Notes (Signed)
  02/26/2012, 8:07 AM  Hospital day: 31 Antibiotics: levaquin, flagyl Solumedrol DCd now IVIG 6-1 and 6-2 Nplate 6-1 Tranexamic acid 6-3 Amicar (po) 6-4 ongoing   Subjective: Sleeping quietly now, no family here. I have spoken with Dr Suszanne Conners at bedside and with RN now. Per RN, had ~ 3 liters blood from rectum + urine overnight. Continues to ooze from Right nares. No new areas of bleeding. First of 2 units of PRBCs completing now.  Two aunts have arrived and I have spoken with them now. They are appreciative of care and understand situation now.  Patient is awake, sitting on bedpan, begging to eat. Denies shortness of breath, different pain, did sleep some last night. Objective: Vital signs in last 24 hours: Blood pressure 117/88, pulse 101, temperature 97.9 F (36.6 C), temperature source Oral, resp. rate 19, height 5\' 4"  (1.626 m), weight 164 lb 3.2 oz (74.481 kg), SpO2 100.00%. Respirations not labored RA. Sitting up on bedpan in bed without assistance. Lungs clear posteriorly. Heart RRR, no gallop. Abdomen not more distended. No bloody stool in ileostomy. LE no edema, feet warm, SCD on. Skin with petechiae on back and extremities. One of 2 JP with some blood. Moves all extremities. Speech fluent and appropriate.  Intake/Output from previous day: 06/04 0701 - 06/05 0700 In: 6839 [P.O.:576; I.V.:1380; ZOXWR:6045; IV Piggyback:902; TPN:1710] Out: 4312 [Urine:3850; Drains:408; Stool:54] Intake/Output this shift:    Lab Results:  Basename 02/26/12 0524 02/26/12 0400 02/25/12 2339  WBC 23.6* -- 24.0*  HGB 7.8* -- 7.7*  HCT 21.3* -- 20.9*  PLT <5* <5* --   DIC screen this am no schistocytes, INR 1.3, PTT 28, fibrinogen 228, Ddimer 2.34 BMET  Basename 02/26/12 0524 02/26/12 0402  NA 130* 127*  K 4.3 5.9*  CL 98 98  CO2 27 26  GLUCOSE 124* 540*  BUN 14 13  CREATININE 0.35* 0.33*  CALCIUM 10.5 10.0  0524 was repeat draw; 0400 draw incorrect  Studies/Results: No results  found.   Assessment/Plan: 1.Acute , profound thrombocytopenia since 02-19-12, which appears to be drug-related, with multiple meds possible, all DCd as best we have been able to do. Not TTP, not HIT, doubt ITP and no response thus far to IVIG/Nplate/steroids, no systemic DIC. HLA typing (and platelet antibodies) likely not available until Friday 6-7. Only a single bump to platelet transfusion documented, that to 45k once last week. Only transient improvement in amount of bleeding was shortly after tranexamic acid bolus + infusion on 6-3. I will DC solumedrol now and will discuss again with my partners. 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 No known active cancer now. 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 Multiple intraabdominal abscesses increasing on last CT, not amenable to percutaneous drainage, also probable anastomotic leak   Marice Guidone P

## 2012-02-26 NOTE — Progress Notes (Signed)
Name: MARDA BREIDENBACH MRN: [id number] DOB: Mar 17, 1976    LOS: 30  Referring Provider:  CCS  Reason for Referral:  Thrombocytopenia    PULMONARY / CRITICAL CARE MEDICINE  Brief patient description:  36 y/o F with Hx of cervical cancer s/p chemo / radiation (2012), now with abdominal adhesions / abscess and profound thrombocytopenia -not responsive to infusion.  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  Fixed sigmoid with sharp angulation 5/14 Colon resection with sigmoid / diverting loop ileostomy 5/23 Perc Drain by IR of abd abscesses 5/31 PICC line placed Received PRBC 6/2, 6/3  Subjective/Interval events:  Remains stable but critical ~3000cc BRBPR   Vital Signs: Temp:  [97.6 F (36.4 C)-98.7 F (37.1 C)] 97.9 F (36.6 C) (06/05 0852) Pulse Rate:  [39-187] 101  (06/05 0700) Resp:  [9-28] 19  (06/05 0700) BP: (113-147)/(61-126) 117/88 mmHg (06/05 0700) SpO2:  [59 %-100 %] 100 % (06/05 0700) Weight:  [74.481 kg (164 lb 3.2 oz)] 74.481 kg (164 lb 3.2 oz) (06/05 0500)   Intake/Output Summary (Last 24 hours) at 02/26/12 0906 Last data filed at 02/26/12 0600  Gross per 24 hour  Intake   6432 ml  Output   4312 ml  Net   2120 ml    Physical Examination: General:  Chronically ill, appears older than age Neuro:  AAOx4, flat affect, no focal deficits HEENT: dry MM, bleeding from nose Cardiovascular:  s1s2 rrr, no m/r/g Lungs:  resp's even/non-labored, lungs bilaterally clear Abdomen:  Round / soft, tender,  3 JP drains, RLQ ileostomy Musculoskeletal:  No obvious deformities Skin:  Warm/dry  Principal Problem:  *Sigmoid stricture Active Problems:  Cervical cancer  Post-radiation rectal bleeding  Pyelonephritis  Nausea & vomiting  Hypercalcemia  Radiation colitis  Hemorrhage of rectum and anus  Acute posthemorrhagic anemia  Thrombocytopenia   ASSESSMENT AND  PLAN  PULMONARY No results found for this basename: PHART:5,PCO2:5,PCO2ART:5,PO2ART:5,HCO3:5,O2SAT:5 in the last 168 hours Ventilator Settings:   CXR:  none ETT:  none  A:   No acute issues P:   -supportive care, monitor respiratory status   CARDIOVASCULAR No results found for this basename: TROPONINI:5,LATICACIDVEN:5, O2SATVEN:5,PROBNP:5 in the last 168 hours ECG:   Lines:  RUE PICC 5/31>>>  A:  Hypertensive- well controled P:  -prn IV ACE inhibitor , she is allergic to beta blockers -ICU monitoring  RENAL  Lab 02/26/12 0524 02/26/12 0402 02/25/12 0444 02/24/12 0500 02/23/12 0435 02/22/12 0635  NA 130* 127* 131* 131* 131* --  K 4.3 5.9* -- -- -- --  CL 98 98 101 100 100 --  CO2 27 26 25 23 24  --  BUN 14 13 18 19 12  --  CREATININE 0.35* 0.33* 0.32* 0.36* 0.40* --  CALCIUM 10.5 10.0 10.0 10.1 9.8 --  MG -- 1.5 1.5 1.5 1.6 1.6  PHOS -- 3.2 2.2* 2.3 2.0* 2.5   Intake/Output      06/04 0701 - 06/05 0700 06/05 0701 - 06/06 0700   P.O. 576    I.V. (mL/kg) 1380 (18.5)    Blood 2241    Other 30    IV Piggyback 902    TPN 1710    Total Intake(mL/kg) 6839 (91.8)    Urine (mL/kg/hr) 3850 (2.2)    Drains 408    Stool 54    Total Output 4312    Net +2527         Stool Occurrence 1 x  Foley:    A:   Hyponatremia Hypophosphatemia  P:   -TNA per Pharmacy  GASTROINTESTINAL  Lab 02/24/12 0500 02/22/12 0635 02/20/12 0330  AST 16 19 16   ALT 13 17 19   ALKPHOS 51 77 103  BILITOT 0.9 0.8 0.9  PROT 7.3 6.1 6.5  ALBUMIN 2.0* 2.6* 2.2*    A:   Abdominal Pain. S/P sigmoid colectomy with primary anastomosis and diverting ileostomy. Probable anastomotic leak, multiple inta abd abscesses 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 -WOC for ileostomy  Rectal bleeding- continues to have large amt of BRB mixed with large clots am of 6/2-5 with BM  P:   -continue to  monitor -f/u CBC -transfuse PRBC as needed  HEMATOLOGIC  Lab 02/26/12 0524 02/26/12 0400 02/25/12 2339 02/25/12 1615 02/25/12 1129 02/25/12 0444 02/22/12 0635 02/22/12 0634 02/20/12 2356 02/20/12 1140 02/19/12 1712  HGB 7.8* -- 7.7* 6.2* 5.9* 6.9* -- -- -- -- --  HCT 21.3* -- 20.9* 17.1* 16.0* 18.6* -- -- -- -- --  PLT <5* <5* <5* <5* <5* -- -- -- -- -- --  INR -- 1.39 -- -- -- -- 1.18 1.41 1.26 1.21 --  APTT -- 28 -- -- -- -- -- 33 -- 36 40*   Hematology panel 6/1: no schitocytes, haptoglogin 154, fibrinogen 410  A:   Refractory Thrombocytopenia, suspect d/t consumption vs. Drug related (cephlosporin)  Cannot r/o antibody induced drop. S/p chemo / radiation for cervical cancer. Last treatment in 10/2010. Appreciate Dr Precious Reel input - no evidence for TTP, HITT, or DIC. 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 Suspect due to meds, but all possible culprits have been stopped.  P: -f/u hematology recs -continue solumedrol, IVIg as ordered > ? Whether we should begin to back off in absence of any apparent benefit -transfuse plts per hematology -Amicar dosing per H/O -Rocephin d/c'd 6/3 per ID for possible cause of thrombocytopenia    Anemia -secondary to bleeding from nose and rectum, 2 u PRBCs 6/3 P: -Nasal packing per ENT -f/u CBC -transfuse for HGB <7 -limit lab draws   INFECTIOUS  Lab 02/26/12 0524 02/25/12 2339 02/25/12 1615 02/25/12 1129 02/25/12 0444  WBC 23.6* 24.0* 31.3* 27.2* 20.3*  PROCALCITON -- -- -- -- --   Cultures: UC 5/6>>>neg 5/13 MRSA PCR>>>neg 5/23 Abd abscess drain>>>multiple organisims 5/23 Abd drainage>>>few e-Coli 5/23 RU abd drain>>>rare viridans, abundant bacteroides fragilis, beta lactam positive 6/2 HIV>>> neg  Antibiotics: Augmentin 5/20>>5/22 Cefoxitin 5/13>>>5/15 Rocephin 5/6>>>5/8, 5/30>>>6/3 Cipro 5/29>>>5/30 Flagyl 5/29>>> Zosyn 5/22>>>5/29 Levaquin 6/3>>>  A:   Abdominal Abscess P:   -abx as  above -per CCS when to go to OR -f/u ID recs  ENDOCRINE  Lab 02/26/12 0841 02/26/12 0413 02/26/12 0015 02/26/12 0013 02/25/12 2003  GLUCAP 151* 140* 129* 586* 122*   A:   Hyperglycemia- well controled  P:   -monitor CBG glucose -Continue SSI  NEUROLOGIC  A:  Anxiety  P:   -supportive care -PRN ativan   BEST PRACTICE / DISPOSITION Level of Care:  ICU Primary Service:  CCS Consultants:  PCCM, Hematology, GI Code Status:  Full Diet:  Clear DVT Px:  scd's GI Px:  pepcid Skin Integrity:  intact Social / Family: Patient has 8 children.  Spoke w patient and aunts 6/5   Levy Pupa, MD, PhD 02/26/2012, 9:03 AM [facility] [phone] or if  no answer [phone]

## 2012-02-26 NOTE — Progress Notes (Signed)
INFECTIOUS DISEASE PROGRESS NOTE  ID: Vanessa [name] is a 36 y.o. female with  Principal Problem:  *Sigmoid stricture Active Problems:  Cervical cancer  Post-radiation rectal bleeding  Pyelonephritis  Nausea & vomiting  Hypercalcemia  Radiation colitis  Hemorrhage of rectum and anus  Acute posthemorrhagic anemia  Thrombocytopenia  Subjective: Resting, interacts transiently  Abtx:  Anti-infectives     Start     Dose/Rate Route Frequency Ordered Stop   02/24/12 1600   levofloxacin (LEVAQUIN) IVPB 750 mg  Status:  Discontinued        750 mg 100 mL/hr over 90 Minutes Intravenous Every 24 hours 02/24/12 1526 02/26/12 1012   02/20/12 1415   cefTRIAXone (ROCEPHIN) 1 g in dextrose 5 % 50 mL IVPB  Status:  Discontinued        1 g 100 mL/hr over 30 Minutes Intravenous Every 24 hours 02/20/12 1404 02/24/12 1526   02/19/12 1800   ciprofloxacin (CIPRO) IVPB 400 mg  Status:  Discontinued        400 mg 200 mL/hr over 60 Minutes Intravenous Every 12 hours 02/19/12 1612 02/20/12 1404   02/19/12 1700   metroNIDAZOLE (FLAGYL) IVPB 500 mg  Status:  Discontinued        500 mg 100 mL/hr over 60 Minutes Intravenous 3 times per day 02/19/12 1612 02/26/12 1012   02/12/12 0800   piperacillin-tazobactam (ZOSYN) IVPB 3.375 g  Status:  Discontinued        3.375 g 12.5 mL/hr over 240 Minutes Intravenous 3 times per day 02/12/12 0800 02/19/12 1612   02/10/12 1000   amoxicillin-clavulanate (AUGMENTIN) 875-125 MG per tablet 1 tablet  Status:  Discontinued        1 tablet Oral Every 12 hours 02/10/12 0823 02/12/12 0800   02/04/12 1800   cefOXitin (MEFOXIN) 1 g in dextrose 5 % 50 mL IVPB        1 g 100 mL/hr over 30 Minutes Intravenous Every 6 hours 02/04/12 1621 02/05/12 0617   02/04/12 0000   cefOXitin (MEFOXIN) 2 g in dextrose 5 % 50 mL IVPB        2 g 100 mL/hr over 30 Minutes Intravenous 60 min pre-op 02/03/12 0333 02/04/12 1145   02/03/12 0500   cefOXitin (MEFOXIN) 2 g in dextrose 5 % 50  mL IVPB  Status:  Discontinued        2 g 100 mL/hr over 30 Minutes Intravenous 60 min pre-op 02/02/12 1156 02/03/12 0333   02/03/12 0000   cefOXitin (MEFOXIN) 2 g in dextrose 5 % 50 mL IVPB  Status:  Discontinued        2 g 100 mL/hr over 30 Minutes Intravenous 60 min pre-op 02/02/12 1004 02/02/12 1156   01/27/12 2200   cefTRIAXone (ROCEPHIN) 1 g in dextrose 5 % 50 mL IVPB        1 g 100 mL/hr over 30 Minutes Intravenous Every 24 hours 01/27/12 2152 01/29/12 2359   01/27/12 1915   cefTRIAXone (ROCEPHIN) 1 g in dextrose 5 % 50 mL IVPB        1 g 100 mL/hr over 30 Minutes Intravenous  Once 01/27/12 1914 01/27/12 1957          Medications:  Scheduled:   . acetaminophen  650 mg Oral Once  . alteplase  2 mg Intracatheter Once  . aminocaproic acid  1 g Oral Q6H  . barrier cream  1 application Topical TID  . feeding supplement  1 Container  Oral BID WC  . folic acid  1 mg Intravenous Daily  . HYDROmorphone PCA 0.3 mg/mL   Intravenous Q4H  . insulin aspart  0-9 Units Subcutaneous Q4H  . morphine  15 mg Oral QHS  . morphine  30 mg Oral Daily  . mulitivitamin with minerals  1 tablet Oral Daily  . oxymetazoline  2 spray Each Nare BID  . sodium chloride  10-40 mL Intracatheter Q12H  . sodium phosphate  Dextrose 5% IVPB  10 mmol Intravenous Once  . DISCONTD: aminocaproic acid  1 g Oral QID  . DISCONTD: aminocaproic acid  500 mg Oral QID  . DISCONTD: hydrochlorothiazide  12.5 mg Oral Daily  . DISCONTD: levofloxacin (LEVAQUIN) IV  750 mg Intravenous Q24H  . DISCONTD: methylPREDNISolone (SOLU-MEDROL) injection  60 mg Intravenous Q12H  . DISCONTD: metronidazole  500 mg Intravenous Q8H    Objective: Vital signs in last 24 hours: Temp:  [97.3 F (36.3 C)-98.7 F (37.1 C)] 97.3 F (36.3 C) (06/05 1215) Pulse Rate:  [67-187] 98  (06/05 1400) Resp:  [9-28] 13  (06/05 1400) BP: (114-147)/(66-126) 129/94 mmHg (06/05 1400) SpO2:  [59 %-100 %] 100 % (06/05 1400) Weight:  [74.481 kg (164  lb 3.2 oz)] 74.481 kg (164 lb 3.2 oz) (06/05 0500)   General appearance: alert and mild distress Nose: L nares packed Resp: clear to auscultation bilaterally Cardio: regular rate and rhythm GI: normal findings: bowel sounds normal and soft, non-tender and drains in place Extremities: RUE PIC clean  Lab Results  Basename 02/26/12 1245 02/26/12 0524 02/26/12 0402  WBC 21.6* 23.6* --  HGB 8.9* 7.8* --  HCT 24.2* 21.3* --  NA -- 130* 127*  K -- 4.3 5.9*  CL -- 98 98  CO2 -- 27 26  BUN -- 14 13  CREATININE -- 0.35* 0.33*  GLU -- -- --   Liver Panel  Basename 02/24/12 0500  PROT 7.3  ALBUMIN 2.0*  AST 16  ALT 13  ALKPHOS 51  BILITOT 0.9  BILIDIR --  IBILI --   Sedimentation Rate No results found for this basename: ESRSEDRATE in the last 72 hours C-Reactive Protein No results found for this basename: CRP:2 in the last 72 hours  Microbiology: No results found for this or any previous visit (from the past 240 hour(s)).  Studies/Results: No results found.   Assessment/Plan: Abdominal Abscesses  Thrombocytopenia  Day 30 anbx- stopped today Discussed with dr byrum. She is now off anbx, to try and get her off all medications which could interact with her bone marrow and potentially cause her low PLT. Will watch   Augmentin 5/20>>5/22  Cefoxitin 5/13>>>5/15  Rocephin 5/6>>>5/8, 5/30>>>6/3  Cipro 5/29>>>5/30  Flagyl 5/29>>> 6-5 Zosyn 5/22>>>5/29  Levaquin 6/3>>>6-5   Johny Sax Infectious Diseases [phone] 02/26/2012, 2:31 PM   LOS: 30 days

## 2012-02-27 ENCOUNTER — Inpatient Hospital Stay (HOSPITAL_COMMUNITY): Payer: Medicaid Other

## 2012-02-27 ENCOUNTER — Encounter (HOSPITAL_COMMUNITY): Payer: Self-pay | Admitting: Radiology

## 2012-02-27 LAB — COMPREHENSIVE METABOLIC PANEL
ALT: 11 U/L (ref 0–35)
AST: 12 U/L (ref 0–37)
Albumin: 1.8 g/dL — ABNORMAL LOW (ref 3.5–5.2)
CO2: 27 mEq/L (ref 19–32)
Calcium: 9.7 mg/dL (ref 8.4–10.5)
Creatinine, Ser: 0.36 mg/dL — ABNORMAL LOW (ref 0.50–1.10)
Sodium: 132 mEq/L — ABNORMAL LOW (ref 135–145)
Total Protein: 4.6 g/dL — ABNORMAL LOW (ref 6.0–8.3)

## 2012-02-27 LAB — PHOSPHORUS: Phosphorus: 2.2 mg/dL — ABNORMAL LOW (ref 2.3–4.6)

## 2012-02-27 LAB — TYPE AND SCREEN
ABO/RH(D): A POS
Antibody Screen: NEGATIVE
Unit division: 0
Unit division: 0
Unit division: 0
Unit division: 0
Unit division: 0
Unit division: 0
Unit division: 0

## 2012-02-27 LAB — GLUCOSE, CAPILLARY
Glucose-Capillary: 84 mg/dL (ref 70–99)
Glucose-Capillary: 124 mg/dL — ABNORMAL HIGH (ref 70–99)

## 2012-02-27 LAB — CBC
HCT: 19.1 % — ABNORMAL LOW (ref 36.0–46.0)
Hemoglobin: 6 g/dL — CL (ref 12.0–15.0)
Hemoglobin: 6.7 g/dL — CL (ref 12.0–15.0)
MCH: 30.9 pg (ref 26.0–34.0)
MCH: 31.3 pg (ref 26.0–34.0)
MCHC: 35.5 g/dL (ref 30.0–36.0)
MCHC: 35.1 g/dL (ref 30.0–36.0)
MCV: 87.1 fL (ref 78.0–100.0)
RBC: 1.94 MIL/uL — ABNORMAL LOW (ref 3.87–5.11)
RDW: 14.7 % (ref 11.5–15.5)

## 2012-02-27 LAB — SAVE SMEAR

## 2012-02-27 LAB — PLATELET AB, INDIRECT, IGG

## 2012-02-27 MED ORDER — AMINOCAPROIC ACID SOLUTION 5% (50 MG/ML)
5.0000 mL | Freq: Three times a day (TID) | ORAL | Status: DC
  Filled 2012-02-27: qty 100

## 2012-02-27 MED ORDER — CLINIMIX E/DEXTROSE (5/20) 5 % IV SOLN
INTRAVENOUS | Status: AC
  Administered 2012-02-27: 18:00:00 via INTRAVENOUS
  Filled 2012-02-27: qty 2000

## 2012-02-27 MED ORDER — AMINOCAPROIC ACID SOLUTION 5% (50 MG/ML)
5.0000 mL | Freq: Three times a day (TID) | ORAL | Status: DC
Start: 2012-02-27 — End: 2012-02-27
  Filled 2012-02-27: qty 100

## 2012-02-27 MED ORDER — IOHEXOL 300 MG/ML  SOLN
20.0000 mL | INTRAMUSCULAR | Status: AC
  Administered 2012-02-27 (×2): 20 mL via ORAL

## 2012-02-27 MED ORDER — DEXTROSE 10 % IV SOLN
INTRAVENOUS | Status: AC
  Administered 2012-02-27: 09:00:00 via INTRAVENOUS

## 2012-02-27 MED ORDER — AMINOCAPROIC ACID 500 MG PO TABS
1.0000 g | ORAL_TABLET | ORAL | Status: DC
  Administered 2012-02-27 – 2012-02-29 (×11): 1 g via ORAL
  Filled 2012-02-27 (×24): qty 2

## 2012-02-27 MED ORDER — MAGNESIUM SULFATE 40 MG/ML IJ SOLN
4.0000 g | Freq: Once | INTRAMUSCULAR | Status: AC
  Administered 2012-02-27: 4 g via INTRAVENOUS
  Filled 2012-02-27: qty 100

## 2012-02-27 MED ORDER — IMMUNE GLOBULIN (HUMAN) 10 GM/100ML IV SOLN
400.0000 mg/kg | INTRAVENOUS | Status: AC
  Administered 2012-02-27 – 2012-02-29 (×3): 30 g via INTRAVENOUS
  Filled 2012-02-27 (×5): qty 300

## 2012-02-27 MED ORDER — IOHEXOL 300 MG/ML  SOLN
80.0000 mL | Freq: Once | INTRAMUSCULAR | Status: AC | PRN
  Administered 2012-02-27: 80 mL via INTRAVENOUS

## 2012-02-27 NOTE — Progress Notes (Addendum)
23 Days Post-Op  Subjective: Pt still with bleeding from nose/rectum and small amount from left pelvic drain  Objective: Vital signs in last 24 hours: Temp:  [97.4 F (36.3 C)-98.7 F (37.1 C)] 97.8 F (36.6 C) (06/06 1300) Pulse Rate:  [104-139] 106  (06/06 1600) Resp:  [12-34] 19  (06/06 1600) BP: (104-124)/(55-89) 118/67 mmHg (06/06 1600) SpO2:  [100 %] 100 % (06/06 1600) Last BM Date: 02/26/12  Intake/Output from previous day: 06/05 0701 - 06/06 0700 In: 3532.5 [I.V.:1795; Blood:362.5; TPN:1330] Out: 1956 [Urine:1800; Drains:105; Stool:51] Intake/Output this shift: Total I/O In: 1230 [I.V.:370; Blood:860] Out: -  Chest- CTA bilat, heart- tachy but regular rhythm, abd - soft, mild -mod diffuse tenderness, few BS; left upper abd drain with minimal output beige fluid, left pelvic drain with blood-tinged output, foul smelling and small amt blood tinged drainage at insertion site; right abd drain with minimal output blood-tinged fluid  Lab Results:   Basename 02/27/12 0534 02/26/12 1715  WBC 24.1* 23.9*  HGB 6.0* 8.3*  HCT 16.9* 22.9*  PLT <5* <5*   BMET  Basename 02/27/12 0500 02/26/12 0524  NA 132* 130*  K 4.2 4.3  CL 100 98  CO2 27 27  GLUCOSE 122* 124*  BUN 14 14  CREATININE 0.36* 0.35*  CALCIUM 9.7 10.5   PT/INR  Basename 02/26/12 0400  LABPROT 17.3*  INR 1.39   ABG No results found for this basename: PHART:2,PCO2:2,PO2:2,HCO3:2 in the last 72 hours  Studies/Results: Ct Abdomen Pelvis W Contrast  02/27/2012  *RADIOLOGY REPORT*  Clinical Data: Anemia, nausea, history of cervical cancer post radiation therapy, radiation colitis, rectal bleeding, intra- abdominal/intrapelvic abscesses, drainage catheters  CT ABDOMEN AND PELVIS WITH CONTRAST  Technique:  Multidetector CT imaging of the abdomen and pelvis was performed following the standard protocol during bolus administration of intravenous contrast. Sagittal and coronal MPR images reconstructed from axial  data set.  Contrast: 80mL OMNIPAQUE IOHEXOL 300 MG/ML SOLN and dilute oral contrast  Comparison: 02/19/2012  Findings: Bibasilar atelectasis. Subcapsular collections identified at liver and the spleen question abscesses. Two subcapsular splenic collections are present, superiorly lenticular shape 10.9 x 1.4 x 2.5 cm and inferolaterally more ovoid 5.6 x 2.2 x 5.4 cm. The hepatic collection measures 4.1 x 1.4 cm at its greatest axial dimensions though this extends laterally along the liver for 13 cm length. Decreased gas within the sub capsular hepatic collection. Splenic collections appear minimally decreased in size. Few scattered small interloop collections question tiny cyst is little changed  Pigtail drainage catheters are identified at three previously seen collections in the sub hepatic space, left mid abdomen, and left pelvis with interval decrease in size of the subglottic and to the left pelvic collections. Low residual left pelvic collection measures approximately 10.2 x 3.5 cm in greatest axial dimensions. Little change in the size of an additional subtle high collection 4.9 x 1.6 cm image 48. Right lower quadrant multiloculated collection identified in aggregate measuring 4.4 x 5.2 cm greatest axial dimensions and extending 6.3 cm length by slightly decreased. Small amount gas/fluid within the anterior abdominal wound at the umbilicus. Additional left upper quadrant abscess collection is little changed, images 33 - 44. An additional loculated retroperitoneal collection has decreased in size, 5.0 x 2.1 cm image 51, previously in excess of 7.7 x 3.6 cm.  Diffuse infiltration of poorly defined tissue planes in the pelvis may be related to prior surgery, radiation therapy and tumor. Significant rectal wall thickening and infiltration of the presacral space  again seen.  Right lower quadrant ileostomy. No definite free intraperitoneal air. Bowel loops appear mildly dilated proximally, without focal point of  obstruction. Mild bilateral hydronephrosis and proximal ureteral dilatation. No acute osseous findings. Inhomogeneous attenuation of the L4-L5 vertebral bodies as well as sacrum, could reflect radiation therapy change or tumor.  IMPRESSION: Multiple persistent abscess collections in the upper abdomen and pelvis, including persistent subcapsular collections at the liver and spleen. Decrease in size of the collections in the left pelvis and in the sub hepatic space post percutaneous drainage. Persistent rectal wall thickening. Mild degree of wall of small bowel distention could reflect a partial degree of obstruction or ileus. Lower attenuation of the L4- L5 vertebral bodies as well as portions of the sacrum could be related to metastatic disease or post radiation therapy change, recommend correlation with radiation port.  Original Report Authenticated By: Lollie Marrow, M.D.    Anti-infectives: Anti-infectives     Start     Dose/Rate Route Frequency Ordered Stop   02/24/12 1600   levofloxacin (LEVAQUIN) IVPB 750 mg  Status:  Discontinued        750 mg 100 mL/hr over 90 Minutes Intravenous Every 24 hours 02/24/12 1526 02/26/12 1012   02/20/12 1415   cefTRIAXone (ROCEPHIN) 1 g in dextrose 5 % 50 mL IVPB  Status:  Discontinued        1 g 100 mL/hr over 30 Minutes Intravenous Every 24 hours 02/20/12 1404 02/24/12 1526   02/19/12 1800   ciprofloxacin (CIPRO) IVPB 400 mg  Status:  Discontinued        400 mg 200 mL/hr over 60 Minutes Intravenous Every 12 hours 02/19/12 1612 02/20/12 1404   02/19/12 1700   metroNIDAZOLE (FLAGYL) IVPB 500 mg  Status:  Discontinued        500 mg 100 mL/hr over 60 Minutes Intravenous 3 times per day 02/19/12 1612 02/26/12 1012   02/12/12 0800   piperacillin-tazobactam (ZOSYN) IVPB 3.375 g  Status:  Discontinued        3.375 g 12.5 mL/hr over 240 Minutes Intravenous 3 times per day 02/12/12 0800 02/19/12 1612   02/10/12 1000   amoxicillin-clavulanate (AUGMENTIN) 875-125  MG per tablet 1 tablet  Status:  Discontinued        1 tablet Oral Every 12 hours 02/10/12 0823 02/12/12 0800   02/04/12 1800   cefOXitin (MEFOXIN) 1 g in dextrose 5 % 50 mL IVPB        1 g 100 mL/hr over 30 Minutes Intravenous Every 6 hours 02/04/12 1621 02/05/12 0617   02/04/12 0000   cefOXitin (MEFOXIN) 2 g in dextrose 5 % 50 mL IVPB        2 g 100 mL/hr over 30 Minutes Intravenous 60 min pre-op 02/03/12 0333 02/04/12 1145   02/03/12 0500   cefOXitin (MEFOXIN) 2 g in dextrose 5 % 50 mL IVPB  Status:  Discontinued        2 g 100 mL/hr over 30 Minutes Intravenous 60 min pre-op 02/02/12 1156 02/03/12 0333   02/03/12 0000   cefOXitin (MEFOXIN) 2 g in dextrose 5 % 50 mL IVPB  Status:  Discontinued        2 g 100 mL/hr over 30 Minutes Intravenous 60 min pre-op 02/02/12 1004 02/02/12 1156   01/27/12 2200   cefTRIAXone (ROCEPHIN) 1 g in dextrose 5 % 50 mL IVPB        1 g 100 mL/hr over 30 Minutes Intravenous Every 24  hours 01/27/12 2152 01/29/12 2359   01/27/12 1915   cefTRIAXone (ROCEPHIN) 1 g in dextrose 5 % 50 mL IVPB        1 g 100 mL/hr over 30 Minutes Intravenous  Once 01/27/12 1914 01/27/12 1957          Assessment/Plan: s/p abd/pelvic abscess drainages 5/23;  persistent abscesses noted on recent CT ; profound thrombocytopenia persists; case d/w Dr. Angus Seller; will plan to exchange/upsize left pelvic drain as well as aspirate/possibly drain left lateral abd abscess on 6/7. If no sig output persists from remaining  right and left abd drains , may consider removal at time of f/u CT 6/7. Details/risks of above d/w pt with her understanding and consent. Continue with transfusions prn.    LOS: 31 days    Colden Samaras,D [facility] 02/27/2012

## 2012-02-27 NOTE — Progress Notes (Signed)
Name: Vanessa [name] MRN: [id number] DOB: 1976-07-21    LOS: 31  Referring Provider:  CCS  Reason for Referral:  Thrombocytopenia    PULMONARY / CRITICAL CARE MEDICINE  Brief patient description:  36 y/o F with Hx of cervical cancer s/p chemo / radiation (2012), now with abdominal adhesions / abscess and profound thrombocytopenia -not responsive to infusion.  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  Fixed sigmoid with sharp angulation 5/14 Colon resection with sigmoid / diverting loop ileostomy 5/23 Perc Drain by IR of abd abscesses 5/31 PICC line placed Received PRBC 6/2, 6/3, 6/4, 6/5, 6/6 ~3000cc BRBPR 6/5  Subjective/Interval events:  Remains stable but critical States bleeding from rectum is decreased today   Vital Signs: Temp:  [97.3 F (36.3 C)-98.6 F (37 C)] 97.9 F (36.6 C) (06/06 0840) Pulse Rate:  [95-139] 129  (06/06 0840) Resp:  [12-34] 29  (06/06 0840) BP: (104-130)/(71-96) 121/79 mmHg (06/06 0840) SpO2:  [100 %] 100 % (06/06 0700)   Intake/Output Summary (Last 24 hours) at 02/27/12 0920 Last data filed at 02/27/12 1191  Gross per 24 hour  Intake   2956 ml  Output   1405 ml  Net   1551 ml    Physical Examination: General:  Chronically ill, appears older than age Neuro:  AAOx4, flat affect, no focal deficits HEENT: dry MM, bleeding from nose Cardiovascular:  s1s2 rrr, no m/r/g Lungs:  resp's even/non-labored, lungs bilaterally clear Abdomen:  Round / soft, tender,  3 JP drains, RLQ ileostomy Musculoskeletal:  No obvious deformities Skin:  Warm/dry  Principal Problem:  *Sigmoid stricture Active Problems:  Cervical cancer  Post-radiation rectal bleeding  Pyelonephritis  Nausea & vomiting  Hypercalcemia  Radiation colitis  Hemorrhage of rectum and anus  Acute posthemorrhagic anemia  Thrombocytopenia   ASSESSMENT AND PLAN  PULMONARY No results found  for this basename: PHART:5,PCO2:5,PCO2ART:5,PO2ART:5,HCO3:5,O2SAT:5 in the last 168 hours Ventilator Settings:   CXR:  none ETT:  none  A:   No acute issues P:   -supportive care, monitor respiratory status   CARDIOVASCULAR No results found for this basename: TROPONINI:5,LATICACIDVEN:5, O2SATVEN:5,PROBNP:5 in the last 168 hours ECG:   Lines:  RUE PICC 5/31>>>  A:  Hypertensive- well controled P:  -she is allergic to beta blockers, d/c'd other prn meds to avoid contribution to thrombocytopenia -ICU monitoring  RENAL  Lab 02/27/12 0500 02/26/12 0524 02/26/12 0402 02/25/12 0444 02/24/12 0500 02/23/12 0435  NA 132* 130* 127* 131* 131* --  K 4.2 4.3 -- -- -- --  CL 100 98 98 101 100 --  CO2 27 27 26 25 23  --  BUN 14 14 13 18 19  --  CREATININE 0.36* 0.35* 0.33* 0.32* 0.36* --  CALCIUM 9.7 10.5 10.0 10.0 10.1 --  MG 1.4* -- 1.5 1.5 1.5 1.6  PHOS 2.2* -- 3.2 2.2* 2.3 2.0*   Intake/Output      06/05 0701 - 06/06 0700 06/06 0701 - 06/07 0700   P.O.     I.V. (mL/kg) 1795 (24.1) 70 (0.9)   Blood 362.5    Other 45    IV Piggyback     TPN 1330    Total Intake(mL/kg) 3532.5 (47.4) 70 (0.9)   Urine (mL/kg/hr) 1800 (1)    Drains 105    Stool 51    Total Output 1956    Net +1576.5 +70         Foley:  A:   Hyponatremia Hypophosphatemia  P:   -TNA per Pharmacy >> fats and pepcid removed to avoid thrombocytopenia  GASTROINTESTINAL  Lab 02/27/12 0500 02/24/12 0500 02/22/12 0635  AST 12 16 19   ALT 11 13 17   ALKPHOS 47 51 77  BILITOT 0.4 0.9 0.8  PROT 4.6* 7.3 6.1  ALBUMIN 1.8* 2.0* 2.6*    A:   Abdominal Pain. S/P sigmoid colectomy with primary anastomosis and diverting ileostomy. Probable anastomotic leak, multiple inta abd abscesses P: -Dilaudid PCA for pain control -Recs per CCS; undecided regarding surgical date, will defer if we believe there is a chance reversible thrombocytopenia -WOC for ileostomy -for CT scan abdomen 6/6  Rectal bleeding- continues  to have large amt of BRB mixed with large clots am of 6/2-6 with BM  P:   -continue to monitor -f/u CBC -transfuse PRBC as needed  HEMATOLOGIC  Lab 02/27/12 0534 02/26/12 1715 02/26/12 1245 02/26/12 0524 02/26/12 0400 02/25/12 2339 02/22/12 0635 02/22/12 0634 02/20/12 2356 02/20/12 1140  HGB 6.0* 8.3* 8.9* 7.8* -- 7.7* -- -- -- --  HCT 16.9* 22.9* 24.2* 21.3* -- 20.9* -- -- -- --  PLT <5* <5* <5* <5* <5* -- -- -- -- --  INR -- -- -- -- 1.39 -- 1.18 1.41 1.26 1.21  APTT -- -- -- -- 28 -- -- 33 -- 36   Hematology panel 6/1: no schitocytes, haptoglogin 154, fibrinogen 410  A:   Refractory Thrombocytopenia, suspect d/t consumption vs. Drug related (? Cephlosporin).  Cannot r/o antibody induced drop. S/p chemo / radiation for cervical cancer. Last treatment in 10/2010. Appreciate Dr Precious Reel input - no evidence for TTP, HITT, or DIC. 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 Suspect due to meds, but all possible culprits have been stopped. Has failed Amicar, IVIg, and steroid treatment.  P: -f/u hematology recs -transfuse plts per hematology -Amicar dosing per H/O  Anemia -secondary to bleeding from nose and rectum, 2 u PRBCs 6/3, 6/6 P: -Nasal packing per ENT -f/u CBC -transfuse for HGB <7 -limit lab draws   INFECTIOUS  Lab 02/27/12 0534 02/26/12 1715 02/26/12 1245 02/26/12 0524 02/25/12 2339  WBC 24.1* 23.9* 21.6* 23.6* 24.0*  PROCALCITON -- -- -- -- --   Cultures: UC 5/6>>>neg 5/13 MRSA PCR>>>neg 5/23 Abd abscess drain>>>multiple organisims 5/23 Abd drainage>>>few e-Coli 5/23 RU abd drain>>>rare viridans, abundant bacteroides fragilis, beta lactam positive 6/2 HIV>>> neg  Antibiotics: Augmentin 5/20>>5/22 Cefoxitin 5/13>>>5/15 Rocephin 5/6>>>5/8, 5/30>>>6/3 Cipro 5/29>>>5/30 Flagyl 5/29>>>6/5 Zosyn 5/22>>>5/29 Levaquin 6/3>>>6/5  A:   Abdominal Abscess P:   -All ABX d/c'd 6/5 in small chance they are causing persistant thrombocytopenia    -follow closely for signs evolving infection -note at high risk toxic shock syndrome with nasal packing and no abx -per CCS when to go to OR   ENDOCRINE  Lab 02/27/12 0401 02/27/12 0010 02/26/12 1942 02/26/12 1629 02/26/12 1214  GLUCAP 118* 121* 139* 128* 130*   A:   Hyperglycemia- well controled  P:   -monitor CBG glucose -Continue SSI  NEUROLOGIC  A:  Anxiety  P:   -supportive care -PRN ativan   BEST PRACTICE / DISPOSITION Level of Care:  ICU Primary Service:  CCS Consultants:  PCCM, Hematology, GI Code Status:  Full Diet:  Clear DVT Px:  scd's GI Px:  pepcid Skin Integrity:  intact Social / Family: Patient has 8 children.  Spoke w pt's aunts 6/5  30 minutes CC time  Levy Pupa, MD, PhD 02/27/2012,  12:29 PM [place] [phone] or if no answer [phone]

## 2012-02-27 NOTE — Progress Notes (Signed)
Subjective: Pt resting comfortably in bed. She continues to have blood oozing around her left nasal packing.  No significant change.   Objective: Vital signs in last 24 hours: Temp:  [97.3 F (36.3 C)-98.6 F (37 C)] 97.9 F (36.6 C) (06/06 0840) Pulse Rate:  [95-139] 129  (06/06 0840) Resp:  [12-34] 29  (06/06 0840) BP: (104-130)/(71-96) 121/79 mmHg (06/06 0840) SpO2:  [100 %] 100 % (06/06 0700)  Physical Exam  Constitutional: Well-developed and well-nourished. No distress.  Head: Normocephalic and atraumatic.  Ears: External ears normal.  Nose: Slight oozing around the left nostril. Left AP Merocel packing in place. Septum midline.  Mouth/Throat: Oropharynx with some dry blood. Eyes: Conjunctivae normal. Pupils are equal, round, and reactive to light.  Neck: Normal range of motion. Neck supple.    Basename 02/27/12 0534 02/26/12 1715  WBC 24.1* 23.9*  HGB 6.0* 8.3*  HCT 16.9* 22.9*  PLT <5* <5*    Basename 02/27/12 0500 02/26/12 0524  NA 132* 130*  K 4.2 4.3  CL 100 98  CO2 27 27  GLUCOSE 122* 124*  BUN 14 14  CREATININE 0.36* 0.35*  CALCIUM 9.7 10.5    Medications:  I have reviewed the patient's current medications. Scheduled:   . alteplase  2 mg Intracatheter Once  . aminocaproic acid  5-15 mL Oral TID  . aminocaproic acid  1 g Oral Q4H  . barrier cream  1 application Topical TID  . feeding supplement  1 Container Oral BID WC  . folic acid  1 mg Intravenous Daily  . HYDROmorphone PCA 0.3 mg/mL   Intravenous Q4H  . insulin aspart  0-9 Units Subcutaneous Q4H  . iohexol  20 mL Oral Q1 Hr x 2  . magnesium sulfate 1 - 4 g bolus IVPB  4 g Intravenous Once  . morphine  15 mg Oral QHS  . morphine  30 mg Oral Daily  . multivitamin with minerals  1 tablet Oral Daily  . oxymetazoline  2 spray Each Nare BID  . sodium chloride  10-40 mL Intracatheter Q12H  . DISCONTD: aminocaproic acid  1 g Oral Q6H  . DISCONTD: hydrochlorothiazide  12.5 mg Oral Daily  .  DISCONTD: levofloxacin (LEVAQUIN) IV  750 mg Intravenous Q24H  . DISCONTD: metronidazole  500 mg Intravenous Q8H    Assessment/Plan: No significant change. Left anterior and posterior epistaxis secondary to severe thrombocytopenia. The left nasal cavity is packed with an AP merocel packing. In light of her severe thrombocytopenia, she will likely have persistent oozing around the packing. Antibiotics stopped yesterday.  Will watchout for toxic shock syndrome.  Will follow.    LOS: 31 days   Quin Mcpherson,SUI W 02/27/2012, 9:28 AM

## 2012-02-27 NOTE — Progress Notes (Signed)
Medical Oncology  See full note from this am also. Smear reviewed from this am: no schistocytes, total 2 possible platelets seen on multiple HPF. Discussed again with my hematology/coag partner. No change in recommendations from our standpoint. Ila Mcgill, MD

## 2012-02-27 NOTE — Progress Notes (Signed)
02/27/2012, 8:36 AM  Hospital day: 32 Antibiotics: all DCd 6-5 Solumedrol DC'd 6-5 IVIG 6-1 and 6-2 Nplate 6-1 --  Can be repeated 6-8 at increased dose Tranexamic acid IV 6-3 Amicar po 6-4 ongoing  Case reviewed entirely with my coagulation partner yesterday, without other additional suggestions for management now.  I have discussed with pharmacist at [facility] now and with [facility] pharmacist in ICU now. She does have pepcid in TNA, which will be discontinued with next bag (tonight) so I have asked to stop present TNA bag now. We have discussed trying amicar liquid perhaps as enema due to the known RT proctitis (as amicar mouthwash is used for bleeding gums in other situations). [facility] has amicar oral concentrate solution in stock, discussed with RN and order placed to try this as gentle enema. I have discussed the bleeding proctitis with radiation oncologist also.  Subjective: Rouses to voice, has just had ativan and continuing PCA dilaudid. Very uncomfortable in abdomen when she is turned in bed and complaining of more abdominal discomfort to RN today, using PCA ~ q 8 min and does have additional pushes that RN can give.  At least three 400-500cc bloody urine + blood from rectum during day yesterday and 300cc this am.   Objective: Vital signs in last 24 hours: Blood pressure 108/73, pulse 123, temperature 97.6 F (36.4 C), temperature source Oral, resp. rate 22, height 5\' 4"  (1.626 m), weight 164 lb 3.2 oz (74.481 kg), SpO2 100.00%. Blood oozing from packing in left nares, reinforced below the packing. Pupils small, equal and reactive. PICC infusing. Heart tachy, clear heart sounds. No JVD. Lungs clear anteriorly. Abdomen somewhat distended. Soft nonbloody brownish stool in ileostomy. LE without edema or cords, minimal petechiae under SCD. Feet warm, moves all extremities.Small ecchymoses/ petechiae on extremities  Intake/Output from previous day: 06/05 0701 - 06/06 0700 In: 3532.5  [I.V.:1795; Blood:362.5; TPN:1330] Out: 1956 [Urine:1800; Drains:105; Stool:51] Intake/Output this shift: Total I/O In: 50 [I.V.:50] Out: -  IV is NS. Pharmacist to add D10 while TNA is off.   Lab Results:  Basename 02/27/12 0534 02/26/12 1715  WBC 24.1* 23.9*  HGB 6.0* 8.3*  HCT 16.9* 22.9*  PLT <5* <5*  Smear requested for MD review now.  Platelet IgG antibodies indirect: postive Direct plt antibodies in process HLA typing still pending, possibly available tomorrow. BMET  Basename 02/27/12 0500 02/26/12 0524  NA 132* 130*  K 4.2 4.3  CL 100 98  CO2 27 27  GLUCOSE 122* 124*  BUN 14 14  CREATININE 0.36* 0.35*  CALCIUM 9.7 10.5    Studies/Results: CT to be repeated now.  Assessment/Plan: 1.Acute profound thrombocytopenia with ongoing bleeding from rectum (proctitis) and epistaxis: All evaluation consistent with drug reaction, tho agent not identified. 2 gram hgb drop in past 24 hrs again. All antibiotics stopped in past 24 hours and pepcid will be taken out of TNA. I have ordered IVIG at 400 mg/kg today and next 2 days, for total 5 doses. Will try amicar solution topical to rectum via enema if possible. I do not see that bone marrow biopsy would change possible management (and would give another area to bleed). Will try another phoresis of platelets given as slow infusion. Note some concern for nasal packing as she is off antibiotics. 2.Bowel stricture and necrosis, post sigmoid resection and ileostomy. Intraabdominal abscesses and concern for anastomotic breakdown. CT pending. Surgical intervention would be extremely high risk but may be unavoidable. 3.history of cervical cancer not known  active. 4. 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  Savas Elvin P [phone]

## 2012-02-27 NOTE — Progress Notes (Signed)
PARENTERAL NUTRITION CONSULT NOTE - FOLLOW UP  Pharmacy Consult for TPN Indication: intraabdominal abscess, ileus  Allergies  Allergen Reactions  . Labetalol Hcl Itching and Other (See Comments)    Bumps to bilateral arms.  . Morphine And Related Hives    Patient Measurements: Height: 5\' 4"  (162.6 cm) Weight: 164 lb 3.2 oz (74.481 kg) IBW/kg (Calculated) : 54.7   Vital Signs: Temp: 97.9 F (36.6 C) (06/06 0403) Temp src: Oral (06/06 0403) BP: 109/72 mmHg (06/06 0700) Pulse Rate: 116  (06/06 0700) Intake/Output from previous day: 06/05 0701 - 06/06 0700 In: 3532.5 [I.V.:1795; Blood:362.5; TPN:1330] Out: 1956 [Urine:1800; Drains:105; Stool:51] Intake/Output from this shift:    Labs:  Basename 02/27/12 0534 02/26/12 1715 02/26/12 1245 02/26/12 0400  WBC 24.1* 23.9* 21.6* --  HGB 6.0* 8.3* 8.9* --  HCT 16.9* 22.9* 24.2* --  PLT <5* <5* <5* --  APTT -- -- -- 28  INR -- -- -- 1.39     Basename 02/27/12 0500 02/26/12 0524 02/26/12 0402 02/25/12 0444  NA 132* 130* 127* --  K 4.2 4.3 5.9* --  CL 100 98 98 --  CO2 27 27 26  --  GLUCOSE 122* 124* 540* --  BUN 14 14 13  --  CREATININE 0.36* 0.35* 0.33* --  LABCREA -- -- -- --  CREAT24HRUR -- -- -- --  CALCIUM 9.7 10.5 10.0 --  MG 1.4* -- 1.5 1.5  PHOS 2.2* -- 3.2 2.2*  PROT 4.6* -- -- --  ALBUMIN 1.8* -- -- --  AST 12 -- -- --  ALT 11 -- -- --  ALKPHOS 47 -- -- --  BILITOT 0.4 -- -- --  BILIDIR -- -- -- --  IBILI -- -- -- --  PREALBUMIN -- -- -- --  TRIG -- -- -- --  CHOLHDL -- -- -- --  CHOL -- -- -- --   Estimated Creatinine Clearance: 96.1 ml/min (by C-G formula based on Cr of 0.36).    Basename 02/27/12 0401 02/27/12 0010 02/26/12 1942  GLUCAP 118* 121* 139*    Medications:  Scheduled:     . alteplase  2 mg Intracatheter Once  . aminocaproic acid  1 g Oral Q6H  . barrier cream  1 application Topical TID  . feeding supplement  1 Container Oral BID WC  . folic acid  1 mg Intravenous Daily  .  HYDROmorphone PCA 0.3 mg/mL   Intravenous Q4H  . insulin aspart  0-9 Units Subcutaneous Q4H  . iohexol  20 mL Oral Q1 Hr x 2  . morphine  15 mg Oral QHS  . morphine  30 mg Oral Daily  . multivitamin with minerals  1 tablet Oral Daily  . oxymetazoline  2 spray Each Nare BID  . sodium chloride  10-40 mL Intracatheter Q12H  . DISCONTD: aminocaproic acid  1 g Oral QID  . DISCONTD: hydrochlorothiazide  12.5 mg Oral Daily  . DISCONTD: levofloxacin (LEVAQUIN) IV  750 mg Intravenous Q24H  . DISCONTD: methylPREDNISolone (SOLU-MEDROL) injection  60 mg Intravenous Q12H  . DISCONTD: metronidazole  500 mg Intravenous Q8H   Infusions:     . sodium chloride 20 mL/hr (02/26/12 0700)  . 0.9 % sodium chloride with kcl 80 mL/hr at 02/26/12 1100  . TPN (CLINIMIX) +/- additives 70 mL/hr at 02/25/12 1707  . TPN (CLINIMIX) +/- additives 70 mL/hr at 02/26/12 1747  . DISCONTD: fat emulsion    . DISCONTD: sodium chloride 0.9 % with kcl Pediatric IV fluid  Insulin Requirements in the past 24 hours:  3 units SSI with NO insulin in TNA with q4h sensitive SSI  Current Nutrition:  Clinimix E5/20 at 70 ml/hr (at goal) Unable to provide lipids due to possible contribution to thrombocytopenia.   Resource Breeze PO BID added 6/5 to maximize oral intake **Note: TPN stopped 6/6 ~9 hours early per MD orders d/t inclusion of famotidine.  Future TPN bags will not include famotidine d/t risk of thrombocytopenia (<1%).  D10 @70ml /hr started until 1800. **   Nutritional Goals:  1800-2000 kCal, 80-95 grams of protein per day.    Clinimix E5/20 goal rate of 60ml/hr will provide an average of 1478 kcal and 84g protein per day based on RD assessment (82% total kcal and 100% protein needs)  Assessment: 70 yof with a history of cervical cancer who is s/p sigmoid colectomy and diverting ileostomy on 5/14. Developed multiple intra-abdominal abscesses 2/2 anastomotic leak shown on CT and had 3 perc drains placed. On 5/29  patient had multiple blood BM, hemoptysis, and abdominal pain. Plts <5 (240k on 5/25). CT 5/30 showed additional abscesses and concern for fistula formation.   Anticoag:  Persistent thrombocytopenia (plts <5) and anemia (hgb 6.0) with rectal bleeding and epistaxis. ID suspects plt drop from beta-lactams, other possibilities include consumption vs antibody induced drop. No evidence of TTP,HITT, or DIC.  Indirect platelet IgG negative. HLA typing likely not available until 6/7.  S/p platelets (5/29: 1 unit, 5/30: 3 units, 5/31: 4 units, 6/1: 1 unit, 6/4: 2units), PRBC (5/29:2 units, 5/31: 2 units, 6/1: 2 units, 6/2: 3 units, 6/3: 2 units, 6/4: 3 units, 6/5: 2 units, 6/6: 2 units), FFP (5/30: 2 units, 6/1: 2 units), and tranexamic acid (6/3: 10mg /kg IV load, then 2mg /kg/hr x 2 hours) GI: s/p sigmoid colectomy and loop ileostomy (5/14). Anastomotic breakdown leading to many intra-abdominal abscesses and concern for fistula on recent CT 5/30. Perc drains in place with 14ml/24hr drainage. Pt may need more drains placed but unable to take to surgery with low plt count. Pt with significant amount of hemoptysis/hematochezia. Unknown GI absorption.  Plan CT scan abdomen and pelvis with contrast 6/6 to reassess fluid collection locations, anatomy, and drainability.  Endo: No hx DM. CBG adequately controlled; Steroids d/c'd 6/5.  Isolated BG 586 contaminated from TPN line - redraw CBG normal@ 129.  Will continue SSI and follow cBGs to assess further need for insulin.   Lytes: 1st BMET 6/5 drawn from TPN line and contaminated.  6/6 BMET: Phos decreased to 2.2, Mag remains below goal despite daily bolus', K ok on NS w/61mEqK @80ml /hr.  CorrCa elevated 11.46 Renal: SCr WNL, UOP 1 ml/kg/hr.  I/O: +1.6 (received several blood product infusions). Wt up 6 lbs since admission. Prealbumin rising.  Pulm: RA Cards: BP ok, HR elevated 115-139 with prn hydralazine (allergy to BB, HCTZ d/c'd d/t possible contribution to  thrombocytopenia) Hepatobil: Alk phos/LFTs/bili wnl from labs on 6/6. Albumin remains low.  Triglycerides slightly increased, will follow.  Cholesterol noted. Neuro: standing po morphine BID and dilaudid PCA  Heme/Onc: Hx of cervical cancer, plts 5/25 at 240 and now <5 and hgb <7 after multiple plt and PRBC transfusions, tranexamic acid load and infusion, and 1 dose nplate,.  Heme/onc consulted - suspect consumption. Unclear if/when OR procedure possible -plan to wait in hopes of plts rising >50K. Nplate weekly (6/1) and PO amicar started 6/4.   ID: Pt with known peritoneal infection now off all Abx (CCM stopped all medications that could  induce thrombocytopenia); WBC remains elevated, stable; afebrile; s/p IVIG x 2 doses - 6/1, 6/2.  ID thinks thrombocytopenia may be d/t beta-lactams given through 6/3.      Rocephin 5/5 >> 5/8; 5/30>>6/3 Cefoxitin 5/14 >> 5/15 Augmentin 5/20 >> 5/21 Zosyn 5/22 >> 5/29 Cipro 5/29 >>5/30 Flagyl 5/29 >>6/5 Levofloxacin 6/3 >>6/4  Micro: 5/23 RUQ Abscess: rare viridans strep; B. Fragilis (no sens) 5/23 LLQ Abscess: few E.coli (pan sens) 5/23 LUQ Abscess: multiple bacteria 5/14 MRSA PCR neg 5/6 Urine: neg  Best Practices: SCDs  Plan:  TPN stopped 6/6 ~9 hours early per MD orders d/t inclusion of famotidine.  Future TPN bags will not include famotidine d/t risk of thrombocytopenia (<1%).  D10 @70ml /hr started until 1800.   -Continue Clinimix E 5/20 at goal of 70 ml/hr  -Stop famotidine in TNA (<1% chance of thrombocytopenia) -Magnesium 4gm IV x 1 -Consider repleting phosphorus 6/7 if level continues to decline.  Will hold off on repleting today due to shortage.   -TE MWF only 2/2 Sport and exercise psychologist.  Fat emulsion omitted d/t to chance of worsening thrombocytopenia.  -On PO MVI 2/2 national shortage of IV MVI -f/u am Mag, BMP, CBGs, phos  Haynes Hoehn, PharmD 02/27/2012 7:44 AM  Pager: [phone]

## 2012-02-27 NOTE — Progress Notes (Addendum)
23 Days Post-Op  Subjective: Alert. Pleasant. HD stable Stable with good urine output. No respiratory problems. Has some abdominal discomfort but is begging for food. Ileostomy working, ileostomy output and non-bloody.  Hemoglobin 6.0 this morning. Platelet count less than 5000. To be transfused 2 more units packed RBCs.  All antibiotics discontinued yesterday. This was done on the theory that one of them may be causing thrombocytopenia.  Objective: Vital signs in last 24 hours: Temp:  [97.3 F (36.3 C)-98.6 F (37 C)] 97.9 F (36.6 C) (06/06 0403) Pulse Rate:  [88-139] 123  (06/06 0200) Resp:  [12-34] 15  (06/06 0400) BP: (105-138)/(71-99) 112/71 mmHg (06/06 0200) SpO2:  [100 %] 100 % (06/06 0400) Last BM Date: 02/26/12  Intake/Output from previous day: 06/05 0701 - 06/06 0700 In: 3532.5 [I.V.:1795; Blood:362.5; TPN:1330] Out: 1956 [Urine:1800; Drains:105; Stool:51] Intake/Output this shift: Total I/O In: 1220 [I.V.:700; Other:30; TPN:490] Out: 525 [Urine:450; Drains:75]  General appearance: alert. Cooperative. Mental status normal. Fatigue. A little stressed but does not look toxic. GI: abdomen is generally soft with active bowel sounds. Mild tenderness upper abdomen. A little more tender in the lower abdomen. Ileostomy healthy with a greenish brown output. Some rectal bleeding per nursing staff.  Lab Results:  Results for orders placed during the hospital encounter of 01/27/12 (from the past 24 hour(s))  GLUCOSE, CAPILLARY     Status: Abnormal   Collection Time   02/26/12  8:41 AM      Component Value Range   Glucose-Capillary 151 (*) 70 - 99 (mg/dL)  OCCULT BLOOD X 1 CARD TO LAB, STOOL     Status: Normal   Collection Time   02/26/12 11:24 AM      Component Value Range   Fecal Occult Bld POSITIVE    GLUCOSE, CAPILLARY     Status: Abnormal   Collection Time   02/26/12 12:14 PM      Component Value Range   Glucose-Capillary 130 (*) 70 - 99 (mg/dL)  CBC     Status: Abnormal    Collection Time   02/26/12 12:45 PM      Component Value Range   WBC 21.6 (*) 4.0 - 10.5 (K/uL)   RBC 2.93 (*) 3.87 - 5.11 (MIL/uL)   Hemoglobin 8.9 (*) 12.0 - 15.0 (g/dL)   HCT 16.1 (*) 09.6 - 46.0 (%)   MCV 82.6  78.0 - 100.0 (fL)   MCH 30.4  26.0 - 34.0 (pg)   MCHC 36.8 (*) 30.0 - 36.0 (g/dL)   RDW 04.5  40.9 - 81.1 (%)   Platelets <5 (*) 150 - 400 (K/uL)  GLUCOSE, CAPILLARY     Status: Abnormal   Collection Time   02/26/12  4:29 PM      Component Value Range   Glucose-Capillary 128 (*) 70 - 99 (mg/dL)  CBC     Status: Abnormal   Collection Time   02/26/12  5:15 PM      Component Value Range   WBC 23.9 (*) 4.0 - 10.5 (K/uL)   RBC 2.75 (*) 3.87 - 5.11 (MIL/uL)   Hemoglobin 8.3 (*) 12.0 - 15.0 (g/dL)   HCT 91.4 (*) 78.2 - 46.0 (%)   MCV 83.3  78.0 - 100.0 (fL)   MCH 30.2  26.0 - 34.0 (pg)   MCHC 36.2 (*) 30.0 - 36.0 (g/dL)   RDW 95.6  21.3 - 08.6 (%)   Platelets <5 (*) 150 - 400 (K/uL)  TYPE AND SCREEN     Status: Normal (  Preliminary result)   Collection Time   02/26/12  5:15 PM      Component Value Range   ABO/RH(D) A POS     Antibody Screen NEG     Sample Expiration 02/29/2012     Unit Number [id number]     Blood Component Type RED CELLS,LR     Unit division 00     Status of Unit ALLOCATED     Transfusion Status OK TO TRANSFUSE     Crossmatch Result Compatible     Unit Number [id number]     Blood Component Type RED CELLS,LR     Unit division 00     Status of Unit ALLOCATED     Transfusion Status OK TO TRANSFUSE     Crossmatch Result Compatible     Unit Number [id number]     Blood Component Type RED CELLS,LR     Unit division 00     Status of Unit ALLOCATED     Transfusion Status OK TO TRANSFUSE     Crossmatch Result Compatible     Unit Number [id number]     Blood Component Type RED CELLS,LR     Unit division 00     Status of Unit ALLOCATED     Transfusion Status OK TO TRANSFUSE     Crossmatch Result Compatible    GLUCOSE, CAPILLARY     Status: Abnormal    Collection Time   02/26/12  7:42 PM      Component Value Range   Glucose-Capillary 139 (*) 70 - 99 (mg/dL)  GLUCOSE, CAPILLARY     Status: Abnormal   Collection Time   02/27/12 12:10 AM      Component Value Range   Glucose-Capillary 121 (*) 70 - 99 (mg/dL)   Comment 1 Documented in Chart     Comment 2 Notify RN    GLUCOSE, CAPILLARY     Status: Abnormal   Collection Time   02/27/12  4:01 AM      Component Value Range   Glucose-Capillary 118 (*) 70 - 99 (mg/dL)   Comment 1 Documented in Chart     Comment 2 Notify RN    COMPREHENSIVE METABOLIC PANEL     Status: Abnormal   Collection Time   02/27/12  5:00 AM      Component Value Range   Sodium 132 (*) 135 - 145 (mEq/L)   Potassium 4.2  3.5 - 5.1 (mEq/L)   Chloride 100  96 - 112 (mEq/L)   CO2 27  19 - 32 (mEq/L)   Glucose, Bld 122 (*) 70 - 99 (mg/dL)   BUN 14  6 - 23 (mg/dL)   Creatinine, Ser 6.96 (*) 0.50 - 1.10 (mg/dL)   Calcium 9.7  8.4 - 29.5 (mg/dL)   Total Protein 4.6 (*) 6.0 - 8.3 (g/dL)   Albumin 1.8 (*) 3.5 - 5.2 (g/dL)   AST 12  0 - 37 (U/L)   ALT 11  0 - 35 (U/L)   Alkaline Phosphatase 47  39 - 117 (U/L)   Total Bilirubin 0.4  0.3 - 1.2 (mg/dL)   GFR calc non Af Amer >90  >90 (mL/min)   GFR calc Af Amer >90  >90 (mL/min)  MAGNESIUM     Status: Abnormal   Collection Time   02/27/12  5:00 AM      Component Value Range   Magnesium 1.4 (*) 1.5 - 2.5 (mg/dL)  PHOSPHORUS     Status: Abnormal   Collection Time  02/27/12  5:00 AM      Component Value Range   Phosphorus 2.2 (*) 2.3 - 4.6 (mg/dL)  CBC     Status: Abnormal (Preliminary result)   Collection Time   02/27/12  5:34 AM      Component Value Range   WBC PENDING  4.0 - 10.5 (K/uL)   RBC 1.94 (*) 3.87 - 5.11 (MIL/uL)   Hemoglobin 6.0 (*) 12.0 - 15.0 (g/dL)   HCT 16.1 (*) 09.6 - 46.0 (%)   MCV 87.1  78.0 - 100.0 (fL)   MCH 30.9  26.0 - 34.0 (pg)   MCHC 35.5  30.0 - 36.0 (g/dL)   RDW 04.5  40.9 - 81.1 (%)   Platelets <5 (*) 150 - 400 (K/uL)      Studies/Results: @RISRSLT24 @     . alteplase  2 mg Intracatheter Once  . aminocaproic acid  1 g Oral Q6H  . barrier cream  1 application Topical TID  . feeding supplement  1 Container Oral BID WC  . folic acid  1 mg Intravenous Daily  . HYDROmorphone PCA 0.3 mg/mL   Intravenous Q4H  . insulin aspart  0-9 Units Subcutaneous Q4H  . morphine  15 mg Oral QHS  . morphine  30 mg Oral Daily  . mulitivitamin with minerals  1 tablet Oral Daily  . oxymetazoline  2 spray Each Nare BID  . sodium chloride  10-40 mL Intracatheter Q12H  . DISCONTD: aminocaproic acid  1 g Oral QID  . DISCONTD: hydrochlorothiazide  12.5 mg Oral Daily  . DISCONTD: levofloxacin (LEVAQUIN) IV  750 mg Intravenous Q24H  . DISCONTD: methylPREDNISolone (SOLU-MEDROL) injection  60 mg Intravenous Q12H  . DISCONTD: metronidazole  500 mg Intravenous Q8H     Assessment/Plan: s/p Procedure(s): COLON RESECTION SIGMOID  Status post sigmoid colectomy with EEA stapled anastomosis and diverting loop ileostomy. This is been complicated by pelvic abscess presumably due to anastomotic disruption. She also has other fluid collections in the abdomen.  Currently she clinically does not have any evidence of septic shock or organ failure.  Because of profound thrombocytopenia, proceeding to laparotomy and washout is associated with very high mortality from exsanguinating bleeding. Consultants indicate that thrombocytopenia s not consumptive in nature, is drug related,  and should reverse. I tend to agree although it is not clear when this might happen.  So long as she is stable, delaying surgery a day or 2 at a time is not associated with any increased risk because it's going to be extremely difficult and bloody  regardless, since she is now 3 weeks postop.  Plan CT scan abdomen and pelvis with contrast today to reassess fluid collections in terms of their location and anatomy and drainability.  ? Should we rethink IR  options??    LOS: 31 days    Omarr Hann M. Derrell Lolling, M.D., [facility] General and Minimally invasive Surgery Breast and Colorectal Surgery Office:   [phone] Pager:   [phone]  02/27/2012  . .prob

## 2012-02-27 NOTE — Progress Notes (Signed)
INFECTIOUS DISEASE PROGRESS NOTE  ID: Vanessa [name] is a 36 y.o. female with   Principal Problem:  *Sigmoid stricture Active Problems:  Cervical cancer  Post-radiation rectal bleeding  Pyelonephritis  Nausea & vomiting  Hypercalcemia  Radiation colitis  Hemorrhage of rectum and anus  Acute posthemorrhagic anemia  Thrombocytopenia  Subjective: Awakens easily. Denies abd pain.   Abtx:  Anti-infectives     Start     Dose/Rate Route Frequency Ordered Stop   02/24/12 1600   levofloxacin (LEVAQUIN) IVPB 750 mg  Status:  Discontinued        750 mg 100 mL/hr over 90 Minutes Intravenous Every 24 hours 02/24/12 1526 02/26/12 1012   02/20/12 1415   cefTRIAXone (ROCEPHIN) 1 g in dextrose 5 % 50 mL IVPB  Status:  Discontinued        1 g 100 mL/hr over 30 Minutes Intravenous Every 24 hours 02/20/12 1404 02/24/12 1526   02/19/12 1800   ciprofloxacin (CIPRO) IVPB 400 mg  Status:  Discontinued        400 mg 200 mL/hr over 60 Minutes Intravenous Every 12 hours 02/19/12 1612 02/20/12 1404   02/19/12 1700   metroNIDAZOLE (FLAGYL) IVPB 500 mg  Status:  Discontinued        500 mg 100 mL/hr over 60 Minutes Intravenous 3 times per day 02/19/12 1612 02/26/12 1012   02/12/12 0800   piperacillin-tazobactam (ZOSYN) IVPB 3.375 g  Status:  Discontinued        3.375 g 12.5 mL/hr over 240 Minutes Intravenous 3 times per day 02/12/12 0800 02/19/12 1612   02/10/12 1000   amoxicillin-clavulanate (AUGMENTIN) 875-125 MG per tablet 1 tablet  Status:  Discontinued        1 tablet Oral Every 12 hours 02/10/12 0823 02/12/12 0800   02/04/12 1800   cefOXitin (MEFOXIN) 1 g in dextrose 5 % 50 mL IVPB        1 g 100 mL/hr over 30 Minutes Intravenous Every 6 hours 02/04/12 1621 02/05/12 0617   02/04/12 0000   cefOXitin (MEFOXIN) 2 g in dextrose 5 % 50 mL IVPB        2 g 100 mL/hr over 30 Minutes Intravenous 60 min pre-op 02/03/12 0333 02/04/12 1145   02/03/12 0500   cefOXitin (MEFOXIN) 2 g in dextrose 5 %  50 mL IVPB  Status:  Discontinued        2 g 100 mL/hr over 30 Minutes Intravenous 60 min pre-op 02/02/12 1156 02/03/12 0333   02/03/12 0000   cefOXitin (MEFOXIN) 2 g in dextrose 5 % 50 mL IVPB  Status:  Discontinued        2 g 100 mL/hr over 30 Minutes Intravenous 60 min pre-op 02/02/12 1004 02/02/12 1156   01/27/12 2200   cefTRIAXone (ROCEPHIN) 1 g in dextrose 5 % 50 mL IVPB        1 g 100 mL/hr over 30 Minutes Intravenous Every 24 hours 01/27/12 2152 01/29/12 2359   01/27/12 1915   cefTRIAXone (ROCEPHIN) 1 g in dextrose 5 % 50 mL IVPB        1 g 100 mL/hr over 30 Minutes Intravenous  Once 01/27/12 1914 01/27/12 1957          Medications:  Scheduled:   . alteplase  2 mg Intracatheter Once  . aminocaproic acid  5-15 mL Oral TID  . aminocaproic acid  1 g Oral Q4H  . barrier cream  1 application Topical TID  .  feeding supplement  1 Container Oral BID WC  . folic acid  1 mg Intravenous Daily  . HYDROmorphone PCA 0.3 mg/mL   Intravenous Q4H  . IMMUNE GLOBULIN 10% (HUMAN) IV - For Fluid Restriction Only  400 mg/kg Intravenous Q24H  . insulin aspart  0-9 Units Subcutaneous Q4H  . iohexol  20 mL Oral Q1 Hr x 2  . magnesium sulfate 1 - 4 g bolus IVPB  4 g Intravenous Once  . morphine  15 mg Oral QHS  . morphine  30 mg Oral Daily  . multivitamin with minerals  1 tablet Oral Daily  . oxymetazoline  2 spray Each Nare BID  . sodium chloride  10-40 mL Intracatheter Q12H  . DISCONTD: aminocaproic acid  1 g Oral Q6H    Objective: Vital signs in last 24 hours: Temp:  [97.3 F (36.3 C)-98.6 F (37 C)] 98 F (36.7 C) (06/06 0945) Pulse Rate:  [95-139] 117  (06/06 0945) Resp:  [12-34] 14  (06/06 0945) BP: (104-130)/(67-96) 114/78 mmHg (06/06 0945) SpO2:  [100 %] 100 % (06/06 0700)   General appearance: no distress Nose: L nares packed Resp: clear to auscultation bilaterally Cardio: regular rate and rhythm GI: normal findings: bowel sounds normal and soft, non-tender and  abnormal findings:  distended  Lab Results  Basename 02/27/12 0534 02/27/12 0500 02/26/12 1715 02/26/12 0524  WBC 24.1* -- 23.9* --  HGB 6.0* -- 8.3* --  HCT 16.9* -- 22.9* --  NA -- 132* -- 130*  K -- 4.2 -- 4.3  CL -- 100 -- 98  CO2 -- 27 -- 27  BUN -- 14 -- 14  CREATININE -- 0.36* -- 0.35*  GLU -- -- -- --   Liver Panel  Basename 02/27/12 0500  PROT 4.6*  ALBUMIN 1.8*  AST 12  ALT 11  ALKPHOS 47  BILITOT 0.4  BILIDIR --  IBILI --   Sedimentation Rate No results found for this basename: ESRSEDRATE in the last 72 hours C-Reactive Protein No results found for this basename: CRP:2 in the last 72 hours  Microbiology: No results found for this or any previous visit (from the past 240 hour(s)).  Studies/Results: No results found.   Assessment/Plan: Abdominal Abscesses  Thrombocytopenia  Day 30 anbx- stopped yestrerday PLT unchanged Discussed with dr byrum. She is now off anbx, to try and get her off all medications which could interact with her bone marrow and potentially cause her low PLT. Will watch her abd as well as her nose (as noted by ENT usually treat with anbx with nasal packing due to risk of toxic shock similar to feminine hygeine products) Cont to watch closely, low threshold to start anbx if she changes clinically   Augmentin 5/20>>5/22  Cefoxitin 5/13>>>5/15  Rocephin 5/6>>>5/8, 5/30>>>6/3  Cipro 5/29>>>5/30  Flagyl 5/29>>> 6-5  Zosyn 5/22>>>5/29  Levaquin 6/3>>>6-5      Johny Sax Infectious Diseases [phone] 02/27/2012, 10:13 AM   LOS: 31 days

## 2012-02-28 ENCOUNTER — Inpatient Hospital Stay (HOSPITAL_COMMUNITY): Payer: Medicaid Other

## 2012-02-28 LAB — GLUCOSE, CAPILLARY
Glucose-Capillary: 124 mg/dL — ABNORMAL HIGH (ref 70–99)
Glucose-Capillary: 130 mg/dL — ABNORMAL HIGH (ref 70–99)
Glucose-Capillary: 124 mg/dL — ABNORMAL HIGH (ref 70–99)
Glucose-Capillary: 145 mg/dL — ABNORMAL HIGH (ref 70–99)
Glucose-Capillary: 122 mg/dL — ABNORMAL HIGH (ref 70–99)

## 2012-02-28 LAB — BASIC METABOLIC PANEL
CO2: 30 mEq/L (ref 19–32)
Chloride: 98 mEq/L (ref 96–112)
Creatinine, Ser: 0.32 mg/dL — ABNORMAL LOW (ref 0.50–1.10)
GFR calc Af Amer: >90 mL/min (ref >90)
Sodium: 132 mEq/L — ABNORMAL LOW (ref 135–145)

## 2012-02-28 LAB — PREPARE PLATELET PHERESIS

## 2012-02-28 LAB — CBC
HCT: 20.2 % — ABNORMAL LOW (ref 36.0–46.0)
HCT: 16.2 % — ABNORMAL LOW (ref 36.0–46.0)
Hemoglobin: 7 g/dL — ABNORMAL LOW (ref 12.0–15.0)
Hemoglobin: 5.6 g/dL — CL (ref 12.0–15.0)
MCH: 31.6 pg (ref 26.0–34.0)
MCH: 30.3 pg (ref 26.0–34.0)
MCH: 31.8 pg (ref 26.0–34.0)
MCHC: 34.7 g/dL (ref 30.0–36.0)
MCHC: 34.6 g/dL (ref 30.0–36.0)
MCV: 90.2 fL (ref 78.0–100.0)
Platelets: <5 10*3/uL — CL (ref 150–400)
RDW: 15.8 % — ABNORMAL HIGH (ref 11.5–15.5)
RDW: 15 % (ref 11.5–15.5)
RDW: 19.1 % — ABNORMAL HIGH (ref 11.5–15.5)

## 2012-02-28 LAB — PROTIME-INR: INR: 1.33 (ref 0.00–1.49)

## 2012-02-28 LAB — MAGNESIUM: Magnesium: 1.6 mg/dL (ref 1.5–2.5)

## 2012-02-28 LAB — APTT: aPTT: 32 seconds (ref 24–37)

## 2012-02-28 LAB — PHOSPHORUS: Phosphorus: 3 mg/dL (ref 2.3–4.6)

## 2012-02-28 MED ORDER — HYDROMORPHONE 0.3 MG/ML IV SOLN
INTRAVENOUS | Status: DC
  Administered 2012-02-28: 10:00:00 via INTRAVENOUS
  Administered 2012-02-28: 3.34 mg via INTRAVENOUS
  Administered 2012-02-28: 2.7 mg via INTRAVENOUS
  Administered 2012-02-28: 17:00:00 via INTRAVENOUS
  Administered 2012-02-28: 6.74 mg via INTRAVENOUS
  Administered 2012-02-29: 3.88 mg via INTRAVENOUS
  Administered 2012-02-29 (×2): via INTRAVENOUS
  Administered 2012-02-29: 1.49 mg via INTRAVENOUS
  Administered 2012-02-29: 4.2 mg via INTRAVENOUS
  Administered 2012-03-01: 1.87 mg via INTRAVENOUS
  Administered 2012-03-01: 1.5 mg via INTRAVENOUS
  Administered 2012-03-01: 2.56 mg via INTRAVENOUS
  Administered 2012-03-01: 1.7 mg via INTRAVENOUS
  Administered 2012-03-01: 25 mL via INTRAVENOUS
  Administered 2012-03-01: 3.57 mg via INTRAVENOUS
  Administered 2012-03-01: 03:00:00 via INTRAVENOUS
  Administered 2012-03-02: 25 mL via INTRAVENOUS
  Administered 2012-03-02: 13:00:00 via INTRAVENOUS
  Administered 2012-03-02: 2.27 mg via INTRAVENOUS
  Administered 2012-03-02: 5.7 mg via INTRAVENOUS
  Administered 2012-03-02: 1.58 mg via INTRAVENOUS
  Administered 2012-03-02: 20:00:00 via INTRAVENOUS
  Administered 2012-03-03: 7.44 mg via INTRAVENOUS
  Administered 2012-03-03: 2.63 mg via INTRAVENOUS
  Administered 2012-03-03: 4.24 mg via INTRAVENOUS
  Administered 2012-03-03: 2.62 mg via INTRAVENOUS
  Administered 2012-03-03 (×2): via INTRAVENOUS
  Administered 2012-03-03: 0.3 mg via INTRAVENOUS
  Administered 2012-03-03: 2.85 mg via INTRAVENOUS
  Administered 2012-03-03: 1.14 mg via INTRAVENOUS
  Administered 2012-03-04: 12:00:00 via INTRAVENOUS
  Administered 2012-03-04: 1.91 mg via INTRAVENOUS
  Administered 2012-03-04: 2.27 mg via INTRAVENOUS
  Administered 2012-03-04: 1.74 mg via INTRAVENOUS
  Administered 2012-03-04: 2 mg via INTRAVENOUS
  Administered 2012-03-04: 1.92 mg via INTRAVENOUS
  Administered 2012-03-04: 4.3 mg via INTRAVENOUS
  Administered 2012-03-05: 2.74 mg via INTRAVENOUS
  Administered 2012-03-05: 09:00:00 via INTRAVENOUS
  Administered 2012-03-05: 3.06 mg via INTRAVENOUS
  Filled 2012-02-28 (×16): qty 25

## 2012-02-28 MED ORDER — ROMIPLOSTIM 250 MCG ~~LOC~~ SOLR
6.0000 ug/kg | Freq: Once | SUBCUTANEOUS | Status: AC
  Administered 2012-02-29: 445 ug via SUBCUTANEOUS
  Filled 2012-02-28 (×3): qty 0.89

## 2012-02-28 MED ORDER — ZINC TRACE METAL 1 MG/ML IV SOLN
INTRAVENOUS | Status: AC
  Administered 2012-02-28: 18:00:00 via INTRAVENOUS
  Filled 2012-02-28: qty 2000

## 2012-02-28 MED ORDER — PROMETHAZINE HCL 25 MG/ML IJ SOLN
12.5000 mg | Freq: Four times a day (QID) | INTRAMUSCULAR | Status: DC | PRN
  Administered 2012-02-29 – 2012-03-01 (×3): 12.5 mg via INTRAVENOUS
  Filled 2012-02-28 (×3): qty 1

## 2012-02-28 MED ORDER — FENTANYL CITRATE 0.05 MG/ML IJ SOLN
INTRAMUSCULAR | Status: AC | PRN
  Administered 2012-02-28 (×6): 50 ug via INTRAVENOUS

## 2012-02-28 MED ORDER — MIDAZOLAM HCL 5 MG/5ML IJ SOLN
INTRAMUSCULAR | Status: AC | PRN
Start: 2012-02-28 — End: 2012-02-28
  Administered 2012-02-28: 1 mg via INTRAVENOUS
  Administered 2012-02-28 (×2): 2 mg via INTRAVENOUS
  Administered 2012-02-28 (×2): 1 mg via INTRAVENOUS

## 2012-02-28 MED ORDER — FENTANYL CITRATE 0.05 MG/ML IJ SOLN
INTRAMUSCULAR | Status: AC
  Filled 2012-02-28: qty 10

## 2012-02-28 MED ORDER — MIDAZOLAM HCL 2 MG/2ML IJ SOLN
INTRAMUSCULAR | Status: AC
  Filled 2012-02-28: qty 10

## 2012-02-28 MED ORDER — MIDAZOLAM HCL 2 MG/2ML IJ SOLN
INTRAMUSCULAR | Status: AC
  Filled 2012-02-28: qty 6

## 2012-02-28 MED ORDER — FENTANYL CITRATE 0.05 MG/ML IJ SOLN
INTRAMUSCULAR | Status: AC
  Filled 2012-02-28: qty 4

## 2012-02-28 NOTE — Progress Notes (Signed)
Subjective: Pt resting comfortably in bed.  The bleeding around her left nasal packing has decreased. No facial or nasal pain on palpation.  Objective: Vital signs in last 24 hours: Temp:  [97.7 F (36.5 C)-99.4 F (37.4 C)] 97.7 F (36.5 C) (06/07 1600) Pulse Rate:  [96-112] 106  (06/07 1540) Resp:  [11-25] 15  (06/07 1600) BP: (103-117)/(62-96) 117/68 mmHg (06/07 1540) SpO2:  [97 %-100 %] 100 % (06/07 1600) FiO2 (%):  [100 %] 100 % (06/06 2000)  Physical Exam  Constitutional: Well-developed and well-nourished. No distress.  Head: Normocephalic and atraumatic.  Ears: External ears normal.  Nose: Slight oozing around the left nostril. Left AP Merocel packing in place. Septum midline.  Mouth/Throat: Oropharynx with some dry blood.  Eyes: Conjunctivae normal. Pupils are equal, round, and reactive to light.  Neck: Normal range of motion. Neck supple.    Basename 02/28/12 0600 02/28/12 0010  WBC 12.6* 15.1*  HGB 7.0* 6.1*  HCT 20.2* 17.4*  PLT <5* <5*    Basename 02/28/12 0600 02/27/12 0500  NA 132* 132*  K 3.8 4.2  CL 98 100  CO2 30 27  GLUCOSE 125* 122*  BUN 10 14  CREATININE 0.32* 0.36*  CALCIUM 9.5 9.7    Medications:  I have reviewed the patient's current medications. Scheduled:   . alteplase  2 mg Intracatheter Once  . aminocaproic acid  5-15 mL Oral TID  . aminocaproic acid  1 g Oral Q4H  . barrier cream  1 application Topical TID  . feeding supplement  1 Container Oral BID WC  . fentaNYL      . fentaNYL      . folic acid  1 mg Intravenous Daily  . HYDROmorphone PCA 0.3 mg/mL   Intravenous Q4H  . IMMUNE GLOBULIN 10% (HUMAN) IV - For Fluid Restriction Only  400 mg/kg Intravenous Q24H  . insulin aspart  0-9 Units Subcutaneous Q4H  . midazolam      . midazolam      . multivitamin with minerals  1 tablet Oral Daily  . oxymetazoline  2 spray Each Nare BID  . romiPLOStim  6 mcg/kg Subcutaneous Once  . sodium chloride  10-40 mL Intracatheter Q12H  .  DISCONTD: aminocaproic acid  5-15 mL Oral TID  . DISCONTD: HYDROmorphone PCA 0.3 mg/mL   Intravenous Q4H  . DISCONTD: morphine  15 mg Oral QHS  . DISCONTD: morphine  30 mg Oral Daily   ZOX:WRUEAV chloride, acetaminophen, alum & mag hydroxide-simeth, diphenhydrAMINE, diphenhydrAMINE, hydrALAZINE, HYDROmorphone (DILAUDID) injection, LORazepam, naloxone, ondansetron (ZOFRAN) IV, ondansetron (ZOFRAN) IV, ondansetron, simethicone, sodium chloride, sodium chloride, zolpidem  Assessment/Plan: The left nasal bleeding has decreased. Left anterior and posterior epistaxis secondary to severe thrombocytopenia. The left nasal cavity is packed with an AP merocel packing. In light of her severe thrombocytopenia, she will likely have some oozing around the packing. Antibiotics stopped 2 days ago. Will watchout for toxic shock syndrome. Will follow.    LOS: 32 days   Vanessa [name],SUI W 02/28/2012, 5:06 PM

## 2012-02-28 NOTE — Progress Notes (Signed)
PARENTERAL NUTRITION CONSULT NOTE - FOLLOW UP  Pharmacy Consult for TPN Indication: intraabdominal abscess, ileus  Allergies  Allergen Reactions  . Labetalol Hcl Itching and Other (See Comments)    Bumps to bilateral arms.  . Morphine And Related Hives   Patient Measurements: Height: 5\' 4"  (162.6 cm) Weight: 164 lb 3.2 oz (74.481 kg) IBW/kg (Calculated) : 54.7   Vital Signs: Temp: 99.4 F (37.4 C) (06/07 0500) Temp src: Oral (06/07 0500) BP: 114/72 mmHg (06/07 0700) Pulse Rate: 109  (06/07 0700) Intake/Output from previous day: 06/06 0701 - 06/07 0700 In: 6386.3 [P.O.:75; I.V.:2766; Blood:1491.3; IV Piggyback:304; TPN:1750] Out: 1138 [Urine:1075; Drains:60; Stool:3] Intake/Output from this shift:  Labs:  Basename 02/28/12 0600 02/28/12 0010 02/27/12 1800 02/26/12 0400  WBC 12.6* 15.1* 16.7* --  HGB 7.0* 6.1* 6.7* --  HCT 20.2* 17.4* 19.1* --  PLT <5* <5* <5* --  APTT 32 -- -- 28  INR 1.33 -- -- 1.39    Basename 02/28/12 0600 02/27/12 0500 02/26/12 0524 02/26/12 0402  NA 132* 132* 130* --  K 3.8 4.2 4.3 --  CL 98 100 98 --  CO2 30 27 27  --  GLUCOSE 125* 122* 124* --  BUN 10 14 14  --  CREATININE 0.32* 0.36* 0.35* --  LABCREA -- -- -- --  CREAT24HRUR -- -- -- --  CALCIUM 9.5 9.7 10.5 --  MG 1.6 1.4* -- 1.5  PHOS 3.0 2.2* -- 3.2  PROT -- 4.6* -- --  ALBUMIN -- 1.8* -- --  AST -- 12 -- --  ALT -- 11 -- --  ALKPHOS -- 47 -- --  BILITOT -- 0.4 -- --  BILIDIR -- -- -- --  IBILI -- -- -- --  PREALBUMIN -- -- -- --  TRIG -- -- -- --  CHOLHDL -- -- -- --  CHOL -- -- -- --   Estimated Creatinine Clearance: 96.1 ml/min (by C-G formula based on Cr of 0.32).   Basename 02/28/12 0417 02/27/12 2349 02/27/12 2009  GLUCAP 130* 121* 124*   Medications:  Scheduled:     . alteplase  2 mg Intracatheter Once  . aminocaproic acid  5-15 mL Oral TID  . aminocaproic acid  1 g Oral Q4H  . barrier cream  1 application Topical TID  . feeding supplement  1 Container Oral  BID WC  . folic acid  1 mg Intravenous Daily  . HYDROmorphone PCA 0.3 mg/mL   Intravenous Q4H  . IMMUNE GLOBULIN 10% (HUMAN) IV - For Fluid Restriction Only  400 mg/kg Intravenous Q24H  . insulin aspart  0-9 Units Subcutaneous Q4H  . iohexol  20 mL Oral Q1 Hr x 2  . magnesium sulfate 1 - 4 g bolus IVPB  4 g Intravenous Once  . morphine  15 mg Oral QHS  . morphine  30 mg Oral Daily  . multivitamin with minerals  1 tablet Oral Daily  . oxymetazoline  2 spray Each Nare BID  . sodium chloride  10-40 mL Intracatheter Q12H  . DISCONTD: aminocaproic acid  5-15 mL Oral TID  . DISCONTD: aminocaproic acid  1 g Oral Q6H   Infusions:     . sodium chloride 20 mL/hr at 02/28/12 0000  . dextrose Stopped (02/27/12 1800)  . 0.9 % sodium chloride with kcl 80 mL/hr at 02/28/12 0723  . TPN (CLINIMIX) +/- additives 70 mL/hr at 02/27/12 1800  . DISCONTD: TPN (CLINIMIX) +/- additives 70 mL/hr at 02/26/12 1747    Insulin Requirements  in the past 24 hours:  3 units SSI with NO insulin in TNA with q4h sensitive SSI  Current Nutrition:  Clinimix E5/20 at 70 ml/hr (at goal) Unable to provide lipids due to possible contribution to thrombocytopenia.   Resource Breeze PO BID added 6/5 to maximize oral intake **Note: TPN stopped 6/6 ~9 hours early per MD orders d/t inclusion of famotidine.  Future TPN bags will not include famotidine d/t risk of thrombocytopenia (<1%).  D10 @70ml /hr started until 1800. **   Nutritional Goals:  1800-2000 kCal, 80-95 grams of protein per day.    Clinimix E5/20 goal rate of 37ml/hr will provide an average of 1478 kcal and 84g protein per day based on RD assessment (82% total kcal and 100% protein needs)  Assessment: 59 yof with a history of cervical cancer who is s/p sigmoid colectomy and diverting ileostomy on 5/14. Developed multiple intra-abdominal abscesses 2/2 anastomotic leak shown on CT and had 3 perc drains placed. On 5/29 patient had multiple blood BM, hemoptysis, and  abdominal pain. Plts <5 (240k on 5/25). CT 5/30 showed additional abscesses and concern for fistula formation.   Anticoag:  Persistent thrombocytopenia (plts <5) and anemia (hgb 6.0) with rectal bleeding and epistaxis. ID suspects plt drop from beta-lactams, other possibilities include consumption vs antibody induced drop. No evidence of TTP,HITT, or DIC.  Indirect platelet IgG negative. HLA typing likely not available until 6/7.  S/p platelets (5/29: 1 unit, 5/30: 3 units, 5/31: 4 units, 6/1: 1 unit, 6/4: 2units), PRBC (5/29:2 units, 5/31: 2 units, 6/1: 2 units, 6/2: 3 units, 6/3: 2 units, 6/4: 3 units, 6/5: 2 units, 6/6: 2 units), FFP (5/30: 2 units, 6/1: 2 units), and tranexamic acid (6/3: 10mg /kg IV load, then 2mg /kg/hr x 2 hours) GI: s/p sigmoid colectomy and loop ileostomy (5/14). Anastomotic breakdown leading to many intra-abdominal abscesses and concern for fistula on recent CT 5/30. Perc drains in place with 29ml/24hr drainage. Pt may need more drains placed but unable to take to surgery with low plt count. Pt with significant amount of hemoptysis/hematochezia. Unknown GI absorption. CT scan abdomen and pelvis 6/6 - Continues to show persistent abscess collections in the upper abd./pelvis Endo: No hx DM. CBG adequately controlled; Steroids d/c'd 6/5.  Isolated BG 586 contaminated from TPN line - redraw CBG normal@ 129.  Will continue SSI and follow cBGs to assess further need for insulin.   Lytes: 1st BMET 6/5 drawn from TPN line and contaminated.  6/6 BMET: Phos decreased to 2.2, Mag remains below goal despite daily bolus', K ok on NS w/36mEqK @80ml /hr.  CorrCa elevated 11.46 Renal: SCr WNL, UOP 1 ml/kg/hr.  I/O: +1.6 (received several blood product infusions). Wt up 6 lbs since admission. Prealbumin rising.  Pulm: RA Cards: BP ok, HR elevated 115-139 with prn hydralazine (allergy to BB, HCTZ d/c'd d/t possible contribution to thrombocytopenia) Hepatobil: Alk phos/LFTs/bili wnl from labs on 6/6.  Albumin remains low.  Triglycerides slightly increased, will follow.  Cholesterol noted. Neuro: standing po morphine BID and dilaudid PCA  Heme/Onc: Hx of cervical cancer, plts 5/25 at 240 and now <5 and hgb <7 after multiple plt and PRBC transfusions, tranexamic acid load and infusion, and 1 dose nplate,.  Heme/onc consulted - suspect consumption. Unclear if/when OR procedure possible -plan to wait in hopes of plts rising >50K. Nplate weekly (6/1) and PO amicar started 6/4. Would Desmopressin be of any benefit?    ID: Pt with known peritoneal infection now off all Abx (CCM stopped all  medications that could induce thrombocytopenia); WBC remains elevated, stable; afebrile; s/p IVIG x 2 doses - 6/1, 6/2.  ID thinks thrombocytopenia may be d/t beta-lactams given through 6/3.      Rocephin 5/5 >> 5/8; 5/30>>6/3 Cefoxitin 5/14 >> 5/15 Augmentin 5/20 >> 5/21 Zosyn 5/22 >> 5/29 Cipro 5/29 >>5/30 Flagyl 5/29 >>6/5 Levofloxacin 6/3 >>6/4  Micro: 5/23 RUQ Abscess: rare viridans strep; B. Fragilis (no sens) 5/23 LLQ Abscess: few E.coli (pan sens) 5/23 LUQ Abscess: multiple bacteria 5/14 MRSA PCR neg 5/6 Urine: neg  Best Practices: SCDs  Plan:  TPN stopped 6/6 ~9 hours early per MD orders d/t inclusion of famotidine.  Future TPN bags will not include famotidine d/t risk of thrombocytopenia (<1%).  D10 @70ml /hr started until 1800.   -Continue Clinimix E 5/20 at goal of 70 ml/hr  -Stop famotidine in TNA (<1% chance of thrombocytopenia) - Repeat Magnesium bolus of 2gm IV x 1 - TE MWF only 2/2 Sport and exercise psychologist.  Fat emulsion omitted d/t to chance of worsening thrombocytopenia.  -On PO MVI 2/2 national shortage of IV MVI -f/u am Mag, BMP, CBGs, phos  Nadara Mustard, PharmD., MS Clinical Pharmacist Pager:  [phone]  Thank you for allowing pharmacy to be part of this patients care team.  02/28/2012 7:50 AM

## 2012-02-28 NOTE — Progress Notes (Signed)
eLink Physician-Brief Progress Note Patient Name: Vanessa [name] DOB: 04-21-1976 MRN: [id number]  Date of Service  02/28/2012   HPI/Events of Note  Patient continues to actively bleed with drop in Hgb from 6.7 to 6.1.  Receiving multiple medications to attempt to slow rectal bleeding.   eICU Interventions  Plan: Appears transfusion trigger remains at 7.0 Transfuse 1 unit pRBC Post-transfusion CBC   Intervention Category Intermediate Interventions: Bleeding - evaluation and treatment with blood products  Yukari Flax 02/28/2012, 12:40 AM

## 2012-02-28 NOTE — Progress Notes (Signed)
Name: Vanessa [name] MRN: [id number] DOB: Feb 26, 1976    LOS: 32  Referring Provider:  CCS  Reason for Referral:  Thrombocytopenia    PULMONARY / CRITICAL CARE MEDICINE  Brief patient description:  36 y/o F with Hx of cervical cancer s/p chemo / radiation (2012), now with abdominal adhesions / abscess and profound thrombocytopenia -not responsive to infusion.  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  Fixed sigmoid with sharp angulation 5/14 Colon resection with sigmoid / diverting loop ileostomy 5/23 Perc Drain by IR of abd abscesses 5/31 PICC line placed Received PRBC 6/2, 6/3, 6/4, 6/5, 6/6 ~3000cc BRBPR 6/5  Subjective/Interval events:  Remains stable but critical Continued rectal bleeding C/o nausea   Vital Signs: Temp:  [97.4 F (36.3 C)-99.4 F (37.4 C)] 98 F (36.7 C) (06/07 0824) Pulse Rate:  [96-129] 109  (06/07 0700) Resp:  [12-29] 24  (06/07 0700) BP: (103-124)/(55-96) 114/72 mmHg (06/07 0700) SpO2:  [99 %-100 %] 99 % (06/07 0700) FiO2 (%):  [100 %] 100 % (06/06 2000)   Intake/Output Summary (Last 24 hours) at 02/28/12 0828 Last data filed at 02/28/12 0700  Gross per 24 hour  Intake 6916.25 ml  Output   1138 ml  Net 5778.25 ml    Physical Examination: General:  Chronically ill, appears older than age Neuro:  AAOx4, flat affect, no focal deficits HEENT: dry MM, bleeding from nose Cardiovascular:  s1s2 rrr, no m/r/g Lungs:  resp's even/non-labored, lungs bilaterally clear Abdomen:  Round / soft, tender,  3 JP drains, RLQ ileostomy Musculoskeletal:  No obvious deformities Skin:  Warm/dry  Principal Problem:  *Sigmoid stricture Active Problems:  Cervical cancer  Post-radiation rectal bleeding  Pyelonephritis  Nausea & vomiting  Hypercalcemia  Radiation colitis  Hemorrhage of rectum and anus  Acute posthemorrhagic anemia  Thrombocytopenia   ASSESSMENT AND  PLAN  PULMONARY No results found for this basename: PHART:5,PCO2:5,PCO2ART:5,PO2ART:5,HCO3:5,O2SAT:5 in the last 168 hours Ventilator Settings: Vent Mode:  [-]  FiO2 (%):  [100 %] 100 % CXR:  none ETT:  none  A:   No acute issues P:   -supportive care, monitor respiratory status   CARDIOVASCULAR No results found for this basename: TROPONINI:5,LATICACIDVEN:5, O2SATVEN:5,PROBNP:5 in the last 168 hours ECG:   Lines:  RUE PICC 5/31>>>  A:  Hypertensive- well controled P:  -she is allergic to beta blockers, d/c'd other prn meds to avoid contribution to thrombocytopenia -ICU monitoring  RENAL  Lab 02/28/12 0600 02/27/12 0500 02/26/12 0524 02/26/12 0402 02/25/12 0444 02/24/12 0500  NA 132* 132* 130* 127* 131* --  K 3.8 4.2 -- -- -- --  CL 98 100 98 98 101 --  CO2 30 27 27 26 25  --  BUN 10 14 14 13 18  --  CREATININE 0.32* 0.36* 0.35* 0.33* 0.32* --  CALCIUM 9.5 9.7 10.5 10.0 10.0 --  MG 1.6 1.4* -- 1.5 1.5 1.5  PHOS 3.0 2.2* -- 3.2 2.2* 2.3   Intake/Output      06/06 0701 - 06/07 0700 06/07 0701 - 06/08 0700   P.O. 75    I.V. (mL/kg) 3166 (42.5)    Blood 1491.3    Other     IV Piggyback 304    TPN 2030    Total Intake(mL/kg) 7066.3 (94.9)    Urine (mL/kg/hr) 1075 (0.6)    Drains 60    Stool 3    Total Output 1138    Net +5928.3  Urine Occurrence 1900 x    Emesis Occurrence 2000 x     Foley:    A:   Hyponatremia Hypophosphatemia  P:   -TNA per Pharmacy >> fats and pepcid removed to avoid thrombocytopenia  GASTROINTESTINAL  Lab 02/27/12 0500 02/24/12 0500 02/22/12 0635  AST 12 16 19   ALT 11 13 17   ALKPHOS 47 51 77  BILITOT 0.4 0.9 0.8  PROT 4.6* 7.3 6.1  ALBUMIN 1.8* 2.0* 2.6*   CT ABD/Pelvis 6/7>>>Multiple persistent abscess collections in the upper abdomen and pelvis, including persistent subcapsular collections at the liver  and spleen. Persistent rectal wall thickening.  Mild degree of wall of small bowel distention could reflect a    partial degree of obstruction or ileus.   A:   Abdominal Pain. S/P sigmoid colectomy with primary anastomosis and diverting ileostomy. Probable anastomotic leak, multiple inta abd abscesses P: -Dilaudid PCA for pain control -To IR today 6/7 for possible new drain placement, removal of R drain; NOTE - off abx, may need these added back after intervention if any clinical change -Recs per CCS; undecided regarding surgical date, will defer if we believe there is a chance reversible thrombocytopenia -WOC for ileostomy  A: ? Ileus/nausea/vomiting P: -unable to place NGT d/t thrombocytopenia -Zofran PRN  -Keep NPO  Rectal bleeding- continues to have large amt of BRB mixed with large clots am of 6/2-7 with BM  P:   -continue to monitor -f/u CBC -transfuse PRBC as needed  HEMATOLOGIC  Lab 02/28/12 0600 02/28/12 0010 02/27/12 1800 02/27/12 0534 02/26/12 1715 02/26/12 0400 02/22/12 0635 02/22/12 0634  HGB 7.0* 6.1* 6.7* 6.0* 8.3* -- -- --  HCT 20.2* 17.4* 19.1* 16.9* 22.9* -- -- --  PLT <5* <5* <5* <5* <5* -- -- --  INR 1.33 -- -- -- -- 1.39 1.18 1.41  APTT 32 -- -- -- -- 28 -- 33   Hematology panel 6/1: no schitocytes, haptoglogin 154, fibrinogen 410  A:   Refractory Thrombocytopenia, suspect d/t consumption vs. Drug related (? Cephlosporin).  Cannot r/o antibody induced drop. S/p chemo / radiation for cervical cancer. Last treatment in 10/2010. Appreciate Dr Precious Reel input - no evidence for TTP, HITT, or DIC. 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 Suspect due to meds, but all possible culprits have been stopped. Has failed Amicar, IVIg, and steroid treatment.  P: -f/u hematology recs -transfuse plts per hematology -Amicar dosing per H/O  Anemia -secondary to bleeding from nose and rectum, 2 u PRBCs 6/3, 6/6 P: -Nasal packing per ENT -f/u CBC -transfuse for HGB <7 -limit lab draws -Caution for toxic shock from nasal packing   INFECTIOUS  Lab  02/28/12 0600 02/28/12 0010 02/27/12 1800 02/27/12 0534 02/26/12 1715  WBC 12.6* 15.1* 16.7* 24.1* 23.9*  PROCALCITON -- -- -- -- --   Cultures: UC 5/6>>>neg 5/13 MRSA PCR>>>neg 5/23 Abd abscess drain>>>multiple organisims 5/23 Abd drainage>>>few e-Coli 5/23 RU abd drain>>>rare viridans, abundant bacteroides fragilis, beta lactam positive 6/2 HIV>>> neg  Antibiotics: Augmentin 5/20>>5/22 Cefoxitin 5/13>>>5/15 Rocephin 5/6>>>5/8, 5/30>>>6/3 Cipro 5/29>>>5/30 Flagyl 5/29>>>6/5 Zosyn 5/22>>>5/29 Levaquin 6/3>>>6/5  A:   Abdominal Abscess P:   -All ABX d/c'd 6/5 in small chance they are causing persistant thrombocytopenia; would probably leave off 3-4 days to see if platelets rebound. If not then would restart flagyl/levoquin 6/8 or 6/9 -follow closely for signs evolving infection -note at high risk toxic shock syndrome with nasal packing and no abx -per CCS when to go to OR -  IR 6/7 for attempt to drain abscess  -if decompensation then restart abx as above   ENDOCRINE  Lab 02/28/12 0823 02/28/12 0417 02/27/12 2349 02/27/12 2009 02/27/12 1558  GLUCAP 124* 130* 121* 124* 84   A:   Hyperglycemia- well controled  P:   -monitor CBG glucose -Continue SSI  NEUROLOGIC  A:  Anxiety  P:   -supportive care -PRN ativan   BEST PRACTICE / DISPOSITION Level of Care:  ICU Primary Service:  CCS Consultants:  PCCM, Hematology, GI Code Status:  Full Diet:  Clear DVT Px:  scd's GI Px:  pepcid Skin Integrity:  intact Social / Family: Patient has 8 children.  Spoke w pt's aunts 6/5   Levy Pupa, MD, PhD 02/28/2012, 3:00 PM [facility] [phone] or if no answer [phone]

## 2012-02-28 NOTE — ED Notes (Addendum)
Left lower pelvic drain exchanged from 12 french to a 14 french drain changed from drainage bulb to gravity drainage bag

## 2012-02-28 NOTE — Progress Notes (Signed)
24 Days Post-Op  Subjective: Stable. Awake and alert. Became nauseated and vomited bilious emesis during the night. Hemoglobin at midnight 6.1 and has received one unit of packed cells. Morning lab work pending. Platelet count less than 5.    I discussed her clinical course, imaging findings, and constraints on  surgical intervention with Dr. Fredia Sorrow  in radiology yesterday. The abscess on the right side has resolved and that drain can be removed. There is an abscess on the left side he thinks he can drain. I think this is causing the patient some pain. Most importantly, he thinks that he can reposition and upsize the pelvic drain and possibly achieve better drainage. I suggested that he might want to consider platelet infusion around the time of the procedure.  Currently off all antibiotics only. One of them might be causing her profound thrombocytopenia.  She is tearful this morning. She expresses to that she is going to not sure.  Objective: Vital signs in last 24 hours: Temp:  [97.4 F (36.3 C)-99.4 F (37.4 C)] 99.4 F (37.4 C) (06/07 0500) Pulse Rate:  [101-129] 101  (06/07 0500) Resp:  [12-29] 14  (06/07 0500) BP: (103-124)/(55-96) 106/64 mmHg (06/07 0500) SpO2:  [100 %] 100 % (06/07 0500) FiO2 (%):  [100 %] 100 % (06/06 2000) Last BM Date: 02/26/12  Intake/Output from previous day: 06/06 0701 - 06/07 0700 In: 6386.3 [P.O.:75; I.V.:2766; Blood:1491.3; IV Piggyback:304; TPN:1750] Out: 1138 [Urine:1075; Drains:60; Stool:3] Intake/Output this shift: Total I/O In: 1830.8 [P.O.:75; I.V.:823; Blood:368.8; IV Piggyback:4; TPN:560] Out: 60 [Drains:60]  General appearance: alert. Oriented. Emotionally in distress. Skin warm and dry. GI: abdomen soft. Tender left flank. Suprapubic tenderness. Active bowel sounds. Loop ileostomy right lower quadrant healthy. Right flank drain with minimal serosanguineous drainage. Pelvic drain the left side one draining purulent fluid, one draining  bloody fluid.  Lab Results:  Results for orders placed during the hospital encounter of 01/27/12 (from the past 24 hour(s))  PREPARE PLATELET PHERESIS     Status: Normal (Preliminary result)   Collection Time   02/27/12 10:00 AM      Component Value Range   Unit Number [id number]     Blood Component Type PLTPHER LR2     Unit division 00     Status of Unit ISSUED     Transfusion Status OK TO TRANSFUSE    GLUCOSE, CAPILLARY     Status: Normal   Collection Time   02/27/12 12:02 PM      Component Value Range   Glucose-Capillary 98  70 - 99 (mg/dL)  FIBRINOGEN     Status: Normal   Collection Time   02/27/12 12:41 PM      Component Value Range   Fibrinogen 300  204 - 475 (mg/dL)  GLUCOSE, CAPILLARY     Status: Normal   Collection Time   02/27/12  3:58 PM      Component Value Range   Glucose-Capillary 84  70 - 99 (mg/dL)   Comment 1 Notify RN    CBC     Status: Abnormal   Collection Time   02/27/12  6:00 PM      Component Value Range   WBC 16.7 (*) 4.0 - 10.5 (K/uL)   RBC 2.14 (*) 3.87 - 5.11 (MIL/uL)   Hemoglobin 6.7 (*) 12.0 - 15.0 (g/dL)   HCT 78.2 (*) 95.6 - 46.0 (%)   MCV 89.3  78.0 - 100.0 (fL)   MCH 31.3  26.0 - 34.0 (pg)  MCHC 35.1  30.0 - 36.0 (g/dL)   RDW 47.8  29.5 - 62.1 (%)   Platelets <5 (*) 150 - 400 (K/uL)  GLUCOSE, CAPILLARY     Status: Abnormal   Collection Time   02/27/12  8:09 PM      Component Value Range   Glucose-Capillary 124 (*) 70 - 99 (mg/dL)  GLUCOSE, CAPILLARY     Status: Abnormal   Collection Time   02/27/12 11:49 PM      Component Value Range   Glucose-Capillary 121 (*) 70 - 99 (mg/dL)  CBC     Status: Abnormal   Collection Time   02/28/12 12:10 AM      Component Value Range   WBC 15.1 (*) 4.0 - 10.5 (K/uL)   RBC 1.93 (*) 3.87 - 5.11 (MIL/uL)   Hemoglobin 6.1 (*) 12.0 - 15.0 (g/dL)   HCT 30.8 (*) 65.7 - 46.0 (%)   MCV 90.2  78.0 - 100.0 (fL)   MCH 31.6  26.0 - 34.0 (pg)   MCHC 35.1  30.0 - 36.0 (g/dL)   RDW 84.6 (*) 96.2 - 15.5 (%)   Platelets  <5 (*) 150 - 400 (K/uL)  GLUCOSE, CAPILLARY     Status: Abnormal   Collection Time   02/28/12  4:17 AM      Component Value Range   Glucose-Capillary 130 (*) 70 - 99 (mg/dL)     Studies/Results: @RISRSLT24 @     . alteplase  2 mg Intracatheter Once  . aminocaproic acid  5-15 mL Oral TID  . aminocaproic acid  1 g Oral Q4H  . barrier cream  1 application Topical TID  . feeding supplement  1 Container Oral BID WC  . folic acid  1 mg Intravenous Daily  . HYDROmorphone PCA 0.3 mg/mL   Intravenous Q4H  . IMMUNE GLOBULIN 10% (HUMAN) IV - For Fluid Restriction Only  400 mg/kg Intravenous Q24H  . insulin aspart  0-9 Units Subcutaneous Q4H  . iohexol  20 mL Oral Q1 Hr x 2  . magnesium sulfate 1 - 4 g bolus IVPB  4 g Intravenous Once  . morphine  15 mg Oral QHS  . morphine  30 mg Oral Daily  . multivitamin with minerals  1 tablet Oral Daily  . oxymetazoline  2 spray Each Nare BID  . sodium chloride  10-40 mL Intracatheter Q12H  . DISCONTD: aminocaproic acid  5-15 mL Oral TID  . DISCONTD: aminocaproic acid  1 g Oral Q6H     Assessment/Plan: s/p Procedure(s): COLON RESECTION SIGMOID  POD #24. Status post sigmoid colectomy with stapled anastomosis and diverting loop ileostomy. This has been complicated by pelvic abscess presumably due to anastomotic disruption. She also has other abscess ease including a left flank abscess, left perisplenic abscess, and pelvic sepsis.  Currently no evidence for septic shock or organ failure.  Thrombocytopenia, etiology of this is not clear, but consensus views this is not DIC, not HIT related, and probably related to medication. It is therefore thought to be reversible.  Because of her profound thrombocytopenia, and the 24 day interval between the first operation and today, consideration of laparotomy and washout is very problematic, and would be associated with high risk of injury to adjacent organs, multiple enteric injuries, enteric fistula,  life-threatening hemorrhage, and further sepsis. For this reason we are choosing to proceed with further interventional radiology drainage procedures in hopes of controlling her intra-abdominal sepsis. Clearly there will be much less morbidity associated with this  type of approach. We will have to wait and see whether it truly controls her abdominal infection.Hopefully this will give [place] some time for the thrombocytopenia to resolve which will allowed for definitive surgery weeks from now. I discussed this with the patient. She agrees with this plan. Her questions are answered.    LOS: 32 days    Sade Hollon M. Derrell Lolling, M.D., [facility] General and Minimally invasive Surgery Breast and Colorectal Surgery Office:   [phone] Pager:   [phone]  02/28/2012  . .prob

## 2012-02-28 NOTE — Progress Notes (Signed)
eLink Physician-Brief Progress Note Patient Name: Vanessa [name] DOB: February 22, 1976 MRN: [id number]  Date of Service  02/28/2012   HPI/Events of Note  Hgb < 6  eICU Interventions  Tfx two units PRBC   Intervention Category Major Interventions: Hemorrhage - evaluation and management  Shan Levans 02/28/2012, 6:53 PM

## 2012-02-28 NOTE — Progress Notes (Signed)
INFECTIOUS DISEASE PROGRESS NOTE  ID: Vanessa [name] is a 36 y.o. female with   Principal Problem:  *Sigmoid stricture Active Problems:  Cervical cancer  Post-radiation rectal bleeding  Pyelonephritis  Nausea & vomiting  Hypercalcemia  Radiation colitis  Hemorrhage of rectum and anus  Acute posthemorrhagic anemia  Thrombocytopenia  Subjective: Without complaints, has some LLQ pain. Denies nasal pain  Abtx:  Anti-infectives     Start     Dose/Rate Route Frequency Ordered Stop   02/24/12 1600   levofloxacin (LEVAQUIN) IVPB 750 mg  Status:  Discontinued        750 mg 100 mL/hr over 90 Minutes Intravenous Every 24 hours 02/24/12 1526 02/26/12 1012   02/20/12 1415   cefTRIAXone (ROCEPHIN) 1 g in dextrose 5 % 50 mL IVPB  Status:  Discontinued        1 g 100 mL/hr over 30 Minutes Intravenous Every 24 hours 02/20/12 1404 02/24/12 1526   02/19/12 1800   ciprofloxacin (CIPRO) IVPB 400 mg  Status:  Discontinued        400 mg 200 mL/hr over 60 Minutes Intravenous Every 12 hours 02/19/12 1612 02/20/12 1404   02/19/12 1700   metroNIDAZOLE (FLAGYL) IVPB 500 mg  Status:  Discontinued        500 mg 100 mL/hr over 60 Minutes Intravenous 3 times per day 02/19/12 1612 02/26/12 1012   02/12/12 0800   piperacillin-tazobactam (ZOSYN) IVPB 3.375 g  Status:  Discontinued        3.375 g 12.5 mL/hr over 240 Minutes Intravenous 3 times per day 02/12/12 0800 02/19/12 1612   02/10/12 1000   amoxicillin-clavulanate (AUGMENTIN) 875-125 MG per tablet 1 tablet  Status:  Discontinued        1 tablet Oral Every 12 hours 02/10/12 0823 02/12/12 0800   02/04/12 1800   cefOXitin (MEFOXIN) 1 g in dextrose 5 % 50 mL IVPB        1 g 100 mL/hr over 30 Minutes Intravenous Every 6 hours 02/04/12 1621 02/05/12 0617   02/04/12 0000   cefOXitin (MEFOXIN) 2 g in dextrose 5 % 50 mL IVPB        2 g 100 mL/hr over 30 Minutes Intravenous 60 min pre-op 02/03/12 0333 02/04/12 1145   02/03/12 0500   cefOXitin  (MEFOXIN) 2 g in dextrose 5 % 50 mL IVPB  Status:  Discontinued        2 g 100 mL/hr over 30 Minutes Intravenous 60 min pre-op 02/02/12 1156 02/03/12 0333   02/03/12 0000   cefOXitin (MEFOXIN) 2 g in dextrose 5 % 50 mL IVPB  Status:  Discontinued        2 g 100 mL/hr over 30 Minutes Intravenous 60 min pre-op 02/02/12 1004 02/02/12 1156   01/27/12 2200   cefTRIAXone (ROCEPHIN) 1 g in dextrose 5 % 50 mL IVPB        1 g 100 mL/hr over 30 Minutes Intravenous Every 24 hours 01/27/12 2152 01/29/12 2359   01/27/12 1915   cefTRIAXone (ROCEPHIN) 1 g in dextrose 5 % 50 mL IVPB        1 g 100 mL/hr over 30 Minutes Intravenous  Once 01/27/12 1914 01/27/12 1957          Medications:  Scheduled:   . alteplase  2 mg Intracatheter Once  . aminocaproic acid  5-15 mL Oral TID  . aminocaproic acid  1 g Oral Q4H  . barrier cream  1 application Topical  TID  . feeding supplement  1 Container Oral BID WC  . folic acid  1 mg Intravenous Daily  . HYDROmorphone PCA 0.3 mg/mL   Intravenous Q4H  . IMMUNE GLOBULIN 10% (HUMAN) IV - For Fluid Restriction Only  400 mg/kg Intravenous Q24H  . insulin aspart  0-9 Units Subcutaneous Q4H  . magnesium sulfate 1 - 4 g bolus IVPB  4 g Intravenous Once  . multivitamin with minerals  1 tablet Oral Daily  . oxymetazoline  2 spray Each Nare BID  . sodium chloride  10-40 mL Intracatheter Q12H  . DISCONTD: aminocaproic acid  5-15 mL Oral TID  . DISCONTD: aminocaproic acid  1 g Oral Q6H  . DISCONTD: HYDROmorphone PCA 0.3 mg/mL   Intravenous Q4H  . DISCONTD: morphine  15 mg Oral QHS  . DISCONTD: morphine  30 mg Oral Daily    Objective: Vital signs in last 24 hours: Temp:  [97.4 F (36.3 C)-99.4 F (37.4 C)] 98 F (36.7 C) (06/07 0824) Pulse Rate:  [96-117] 109  (06/07 0700) Resp:  [12-25] 24  (06/07 0700) BP: (103-124)/(55-96) 114/72 mmHg (06/07 0700) SpO2:  [99 %-100 %] 99 % (06/07 0700) FiO2 (%):  [100 %] 100 % (06/06 2000)   General appearance: alert,  cooperative and no distress Nose: L nares packed. non-tender.  Resp: clear to auscultation bilaterally Cardio: regular rate and rhythm GI: normal findings: bowel sounds normal and soft, non-tender  Lab Results  Basename 02/28/12 0600 02/28/12 0010 02/27/12 0500  WBC 12.6* 15.1* --  HGB 7.0* 6.1* --  HCT 20.2* 17.4* --  NA 132* -- 132*  K 3.8 -- 4.2  CL 98 -- 100  CO2 30 -- 27  BUN 10 -- 14  CREATININE 0.32* -- 0.36*  GLU -- -- --   Liver Panel  Basename 02/27/12 0500  PROT 4.6*  ALBUMIN 1.8*  AST 12  ALT 11  ALKPHOS 47  BILITOT 0.4  BILIDIR --  IBILI --   Sedimentation Rate No results found for this basename: ESRSEDRATE in the last 72 hours C-Reactive Protein No results found for this basename: CRP:2 in the last 72 hours  Microbiology: No results found for this or any previous visit (from the past 240 hour(s)).  Studies/Results: Ct Abdomen Pelvis W Contrast  02/27/2012  *RADIOLOGY REPORT*  Clinical Data: Anemia, nausea, history of cervical cancer post radiation therapy, radiation colitis, rectal bleeding, intra- abdominal/intrapelvic abscesses, drainage catheters  CT ABDOMEN AND PELVIS WITH CONTRAST  Technique:  Multidetector CT imaging of the abdomen and pelvis was performed following the standard protocol during bolus administration of intravenous contrast. Sagittal and coronal MPR images reconstructed from axial data set.  Contrast: 80mL OMNIPAQUE IOHEXOL 300 MG/ML SOLN and dilute oral contrast  Comparison: 02/19/2012  Findings: Bibasilar atelectasis. Subcapsular collections identified at liver and the spleen question abscesses. Two subcapsular splenic collections are present, superiorly lenticular shape 10.9 x 1.4 x 2.5 cm and inferolaterally more ovoid 5.6 x 2.2 x 5.4 cm. The hepatic collection measures 4.1 x 1.4 cm at its greatest axial dimensions though this extends laterally along the liver for 13 cm length. Decreased gas within the sub capsular hepatic collection.  Splenic collections appear minimally decreased in size. Few scattered small interloop collections question tiny cyst is little changed  Pigtail drainage catheters are identified at three previously seen collections in the sub hepatic space, left mid abdomen, and left pelvis with interval decrease in size of the subglottic and to the left pelvic collections.  Low residual left pelvic collection measures approximately 10.2 x 3.5 cm in greatest axial dimensions. Little change in the size of an additional subtle high collection 4.9 x 1.6 cm image 48. Right lower quadrant multiloculated collection identified in aggregate measuring 4.4 x 5.2 cm greatest axial dimensions and extending 6.3 cm length by slightly decreased. Small amount gas/fluid within the anterior abdominal wound at the umbilicus. Additional left upper quadrant abscess collection is little changed, images 33 - 44. An additional loculated retroperitoneal collection has decreased in size, 5.0 x 2.1 cm image 51, previously in excess of 7.7 x 3.6 cm.  Diffuse infiltration of poorly defined tissue planes in the pelvis may be related to prior surgery, radiation therapy and tumor. Significant rectal wall thickening and infiltration of the presacral space again seen.  Right lower quadrant ileostomy. No definite free intraperitoneal air. Bowel loops appear mildly dilated proximally, without focal point of obstruction. Mild bilateral hydronephrosis and proximal ureteral dilatation. No acute osseous findings. Inhomogeneous attenuation of the L4-L5 vertebral bodies as well as sacrum, could reflect radiation therapy change or tumor.  IMPRESSION: Multiple persistent abscess collections in the upper abdomen and pelvis, including persistent subcapsular collections at the liver and spleen. Decrease in size of the collections in the left pelvis and in the sub hepatic space post percutaneous drainage. Persistent rectal wall thickening. Mild degree of wall of small bowel  distention could reflect a partial degree of obstruction or ileus. Lower attenuation of the L4- L5 vertebral bodies as well as portions of the sacrum could be related to metastatic disease or post radiation therapy change, recommend correlation with radiation port.  Original Report Authenticated By: Lollie Marrow, M.D.     Assessment/Plan: Abdominal Abscesses  Thrombocytopenia  Day 30 anbx- stopped 6- 5 PLT unchanged HgB better after transfusion.  She seems clinically stable while being quite ill.    Johny Sax Infectious Diseases [phone] 02/28/2012, 9:07 AM   LOS: 32 days

## 2012-02-28 NOTE — Progress Notes (Signed)
Pharmacy note:  Pain management  Asked to assist with Dilaudid PCA management.  Ms. Vanessa [name] has been on MS contin 15mg  at bedtime and 30mg  in the morning.  This has been causing nausea and vomiting.    Current utilization of PCA is averaging 1.5mg  in 4 hours.    Will start an infusion of Dilaudid of 0.4mg /hr and continue the same demand dose of 0.3mg  every 8 minutes and a 4 hour limit of 5mg .  Celedonio Miyamoto, PharmD, BCPS Clinical Pharmacist Pager [phone]

## 2012-02-28 NOTE — Progress Notes (Signed)
02/28/2012, 8:05 AM  Hospital day: 42 Antibiotics: off all antibiotics as of 02-26-12 IVIG 6-1, 6-2, and resumed 6-6 x 3 days Nplate 6-1, can repeat 6-8 Tranexamic acid IV 6-3 Amicar po 6-4 ongoing  Subjective: Awake and alert, complains of some pain now left mid/lower abdomen. Has had at least 2 episodes of nausea/vomiting, none now. Dilaudid PCA "not helping enough"; is also on po MS Contin, but nausea/vomiting as above; has not had prn IV by RNS. Too uncomfortable to allow trial of rectal amicar solution. Taking minimal po clears "tastes like medicine". Discussed with nursing and pharmacist here. Will DC po MS Contin and begin dilaudid continuous infusion in addition to the prn PCA. Plan for IR drainage procedure today noted. I would be in favor of infusing platelets during procedure. No family in room now. Objective: Vital signs in last 24 hours: Blood pressure 114/72, pulse 109, temperature 99.4 F (37.4 C), temperature source Oral, resp. rate 24, height 5\' 4"  (1.626 m), weight 164 lb 3.2 oz (74.481 kg), SpO2 99.00%. Rouses easily to voice. Looks uncomfortable but not worse than previously. Conversation fluent and appropriate. Dressing dry on nasal packing now. Lungs clear anteriorly. Abdomen seems somewhat more distended. Purulent drainage and small blood in JPs. Brown stool in ileostomy.Abdomen quiet. No oozing at lines. LE warm, no pitting edema. No increase in petechiae/sub Q bleed areas on skin. Moves all extremities easily. TNA infusing (no pepcid now) Intake/Output from previous day: 06/06 0701 - 06/07 0700 In: 7066.3 [P.O.:75; I.V.:3166; Blood:1491.3; IV Piggyback:304; TPN:2030] Out: 1138 [Urine:1075; Drains:60; Stool:3] Intake/Output this shift:      Lab Results:  Basename 02/28/12 0600 02/28/12 0010  WBC 12.6* 15.1*  HGB 7.0* 6.1*  HCT 20.2* 17.4*  PLT <5* <5*   BMET  Basename 02/28/12 0600 02/27/12 0500  NA 132* 132*  K 3.8 4.2  CL 98 100  CO2 30 27  GLUCOSE  125* 122*  BUN 10 14  CREATININE 0.32* 0.36*  CALCIUM 9.5 9.7   Fibrinogen yesterday 300 HLA typing still pending. Studies/Results: Ct Abdomen Pelvis W Contrast  02/27/2012  *RADIOLOGY REPORT*  Clinical Data: Anemia, nausea, history of cervical cancer post radiation therapy, radiation colitis, rectal bleeding, intra- abdominal/intrapelvic abscesses, drainage catheters  CT ABDOMEN AND PELVIS WITH CONTRAST  Technique:  Multidetector CT imaging of the abdomen and pelvis was performed following the standard protocol during bolus administration of intravenous contrast. Sagittal and coronal MPR images reconstructed from axial data set.  Contrast: 80mL OMNIPAQUE IOHEXOL 300 MG/ML SOLN and dilute oral contrast  Comparison: 02/19/2012  Findings: Bibasilar atelectasis. Subcapsular collections identified at liver and the spleen question abscesses. Two subcapsular splenic collections are present, superiorly lenticular shape 10.9 x 1.4 x 2.5 cm and inferolaterally more ovoid 5.6 x 2.2 x 5.4 cm. The hepatic collection measures 4.1 x 1.4 cm at its greatest axial dimensions though this extends laterally along the liver for 13 cm length. Decreased gas within the sub capsular hepatic collection. Splenic collections appear minimally decreased in size. Few scattered small interloop collections question tiny cyst is little changed  Pigtail drainage catheters are identified at three previously seen collections in the sub hepatic space, left mid abdomen, and left pelvis with interval decrease in size of the subglottic and to the left pelvic collections. Low residual left pelvic collection measures approximately 10.2 x 3.5 cm in greatest axial dimensions. Little change in the size of an additional subtle high collection 4.9 x 1.6 cm image 48. Right lower quadrant multiloculated collection  identified in aggregate measuring 4.4 x 5.2 cm greatest axial dimensions and extending 6.3 cm length by slightly decreased. Small amount  gas/fluid within the anterior abdominal wound at the umbilicus. Additional left upper quadrant abscess collection is little changed, images 33 - 44. An additional loculated retroperitoneal collection has decreased in size, 5.0 x 2.1 cm image 51, previously in excess of 7.7 x 3.6 cm.  Diffuse infiltration of poorly defined tissue planes in the pelvis may be related to prior surgery, radiation therapy and tumor. Significant rectal wall thickening and infiltration of the presacral space again seen.  Right lower quadrant ileostomy. No definite free intraperitoneal air. Bowel loops appear mildly dilated proximally, without focal point of obstruction. Mild bilateral hydronephrosis and proximal ureteral dilatation. No acute osseous findings. Inhomogeneous attenuation of the L4-L5 vertebral bodies as well as sacrum, could reflect radiation therapy change or tumor.  IMPRESSION: Multiple persistent abscess collections in the upper abdomen and pelvis, including persistent subcapsular collections at the liver and spleen. Decrease in size of the collections in the left pelvis and in the sub hepatic space post percutaneous drainage. Persistent rectal wall thickening. Mild degree of wall of small bowel distention could reflect a partial degree of obstruction or ileus. Lower attenuation of the L4- L5 vertebral bodies as well as portions of the sacrum could be related to metastatic disease or post radiation therapy change, recommend correlation with radiation port.  Original Report Authenticated By: Lollie Marrow, M.D.     Assessment/Plan: 1.Thrombocytopenia: platelet count has remained < 5K now since acute drop ~ 02-19-12 despite all attempts at intervention, still with rectal bleeding and epistaxis. Drug-induced seems most likely; not in overt DIC but may have some local consumption with intraabdominal abscesses/ anastomotic breakdown. Will continue all support including IVIG and Nplate tomorrow. I have notified blood bank  that we will want HLA matched platelets with HLA information is available (that on lab list 02-22-12 and still pending now). Agree with IR drainage and I would recommend infusing random donor platelets during procedure. Agree with least meds possible, tho she may need to resume antibiotics with all of the problems. 2.Bowel stricture and necrosis related to previous treatment of cervical cancer in late 2011. No cancer on path from surgery 02-04-12. CT report noted, but not clear to me that any of this is recurrent cervical cancer. Gyn onc is aware of situation, as is radiation oncology. 3.post sigmoid resection with ileostomy 02-04-12 4.on TNA 5.increased pain: changing to continuous dilaudid + PCA, pharmacist assisting.  My partners will follow over weekend and Monday. Please call between our rounds if needed. Burton Gahan P

## 2012-02-28 NOTE — Progress Notes (Signed)
CRITICAL VALUE ALERT  Critical value received:  Hgb-5.6  Date of notification:  02/28/2012  Time of notification:  1850  Critical value read back:yes  Nurse who received alert:  Cyndie Mull, RN  MD notified (1st page):  Dr. Delford Field   Time of first page:  1853  MD notified (2nd page):  Time of second page:  Responding MD:  Dr. Delford Field  Time MD responded:  [phone]

## 2012-02-28 NOTE — ED Notes (Signed)
New Drain placement to left upper abd inserted,  12 FR draining to aspiration bulb device

## 2012-02-28 NOTE — Procedures (Signed)
CT-guided exchange and upsize of LLQ drain.  Drain was advanced deeper into abdomen.  Thick bloody fluid was aspirated.  Placement of a new 12 French drain in LUQ abscess.  45 ml of thick yellow fluid was removed.  Will send new fluid for culture.

## 2012-02-29 DIAGNOSIS — D649 Anemia, unspecified: Secondary | ICD-10-CM

## 2012-02-29 DIAGNOSIS — R110 Nausea: Secondary | ICD-10-CM

## 2012-02-29 LAB — CBC
HCT: 20.4 % — ABNORMAL LOW (ref 36.0–46.0)
Hemoglobin: 8.1 g/dL — ABNORMAL LOW (ref 12.0–15.0)
MCHC: 32.8 g/dL (ref 30.0–36.0)
MCV: 89.3 fL (ref 78.0–100.0)
MCV: 94.9 fL (ref 78.0–100.0)
Platelets: 17 10*3/uL — CL (ref 150–400)
RBC: 2.61 MIL/uL — ABNORMAL LOW (ref 3.87–5.11)
RDW: 19.1 % — ABNORMAL HIGH (ref 11.5–15.5)
WBC: 10.8 10*3/uL — ABNORMAL HIGH (ref 4.0–10.5)

## 2012-02-29 LAB — PREPARE PLATELET PHERESIS: Unit division: 0

## 2012-02-29 LAB — BASIC METABOLIC PANEL
CO2: 30 mEq/L (ref 19–32)
Calcium: 9.8 mg/dL (ref 8.4–10.5)
Potassium: 4.1 mEq/L (ref 3.5–5.1)
Sodium: 131 mEq/L — ABNORMAL LOW (ref 135–145)

## 2012-02-29 LAB — PREPARE RBC (CROSSMATCH)

## 2012-02-29 LAB — GLUCOSE, CAPILLARY
Glucose-Capillary: 118 mg/dL — ABNORMAL HIGH (ref 70–99)
Glucose-Capillary: 115 mg/dL — ABNORMAL HIGH (ref 70–99)

## 2012-02-29 MED ORDER — CLINIMIX E/DEXTROSE (5/20) 5 % IV SOLN
INTRAVENOUS | Status: AC
  Administered 2012-02-29: 18:00:00 via INTRAVENOUS
  Filled 2012-02-29: qty 2000

## 2012-02-29 MED ORDER — METOCLOPRAMIDE HCL 5 MG/ML IJ SOLN
5.0000 mg | Freq: Four times a day (QID) | INTRAMUSCULAR | Status: DC
  Administered 2012-02-29 – 2012-03-03 (×12): 5 mg via INTRAVENOUS
  Filled 2012-02-29 (×16): qty 1

## 2012-02-29 MED ORDER — PROCHLORPERAZINE EDISYLATE 5 MG/ML IJ SOLN
5.0000 mg | Freq: Four times a day (QID) | INTRAMUSCULAR | Status: DC
  Administered 2012-02-29 – 2012-03-06 (×23): 5 mg via INTRAVENOUS
  Administered 2012-03-06 (×2): via INTRAVENOUS
  Administered 2012-03-07 – 2012-03-11 (×15): 5 mg via INTRAVENOUS
  Filled 2012-02-29 (×45): qty 1

## 2012-02-29 MED ORDER — DEXAMETHASONE SODIUM PHOSPHATE 4 MG/ML IJ SOLN
4.0000 mg | Freq: Two times a day (BID) | INTRAMUSCULAR | Status: DC
  Administered 2012-02-29: 4 mg via INTRAVENOUS
  Filled 2012-02-29 (×3): qty 1

## 2012-02-29 MED ORDER — SODIUM CHLORIDE 0.9 % IV SOLN
INTRAVENOUS | Status: DC
  Administered 2012-02-29 – 2012-03-01 (×2): via INTRAVENOUS
  Filled 2012-02-29 (×4): qty 1000

## 2012-02-29 NOTE — Progress Notes (Signed)
Name: Vanessa [name] MRN: [id number] DOB: [birth year]    LOS: 33  Referring Provider:  [facility]  Reason for Referral:  Thrombocytopenia    PULMONARY / CRITICAL CARE MEDICINE  Brief patient description:  36 y/o F with Hx of cervical cancer s/p chemo / radiation (2012), admitted 5/6 with abd pain, n/v now with abdominal adhesions / abscess and profound thrombocytopenia -not responsive to infusion.  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  Fixed sigmoid with sharp angulation 5/14 Colon resection with sigmoid / diverting loop ileostomy 5/23 Perc Drain by IR of abd abscesses 5/31 PICC line placed Received PRBC 6/2, 6/3, 6/4, 6/5, 6/6, 6/7 ~3000cc BRBPR 6/5  Subjective/Interval events:  Cont to have large volume emesis.  Cont rectal bleeding required additional PRBC overnight.   Vital Signs: Temp:  [97.7 F (36.5 C)-98.9 F (37.2 C)] 98.1 F (36.7 C) (06/08 1200) Pulse Rate:  [85-115] 100  (06/08 1300) Resp:  [11-25] 21  (06/08 1300) BP: (104-143)/(55-98) 138/84 mmHg (06/08 1300) SpO2:  [97 %-100 %] 100 % (06/08 1300)   Intake/Output Summary (Last 24 hours) at 02/29/12 1319 Last data filed at 02/29/12 1300  Gross per 24 hour  Intake 5975.03 ml  Output   6151 ml  Net -175.97 ml    Physical Examination: General:  Chronically ill, appears older than age Neuro: drowsy post phenergan, arousable, follows commands Cardiovascular:  s1s2 rrr, no m/r/g Lungs:  resp's even/non-labored, few scattered ronchi R>L Abdomen:  Round / soft, tender,  3 JP drains, RLQ ileostomy Musculoskeletal:  No obvious deformities Skin:  Warm/dry  Principal Problem:  *Sigmoid stricture Active Problems:  Cervical cancer  Post-radiation rectal bleeding  Pyelonephritis  Nausea & vomiting  Hypercalcemia  Radiation colitis  Hemorrhage of rectum and anus  Acute posthemorrhagic anemia  Thrombocytopenia   ASSESSMENT  AND PLAN  PULMONARY  CXR:  none ETT:  none  A:   No acute issues P:   -supportive care, monitor respiratory status   CARDIOVASCULAR No results found for this basename: TROPONINI:5,LATICACIDVEN:5, O2SATVEN:5,PROBNP:5 in the last 168 hours ECG:   Lines:  RUE PICC 5/31>>>  A:  Hypertension- improved P:  -she is allergic to beta blockers, PRN hydralazine  -ICU monitoring  RENAL  Lab 02/29/12 0415 02/28/12 0600 02/27/12 0500 02/26/12 0524 02/26/12 0402 02/25/12 0444 02/24/12 0500  NA 131* 132* 132* 130* 127* -- --  K 4.1 3.8 -- -- -- -- --  CL 97 98 100 98 98 -- --  CO2 30 30 27 27 26  -- --  BUN 11 10 14 14 13  -- --  CREATININE 0.32* 0.32* 0.36* 0.35* 0.33* -- --  CALCIUM 9.8 9.5 9.7 10.5 10.0 -- --  MG -- 1.6 1.4* -- 1.5 1.5 1.5  PHOS -- 3.0 2.2* -- 3.2 2.2* 2.3   Intake/Output      06/07 0701 - 06/08 0700 06/08 0701 - 06/09 0700   P.O.  100   I.V. (mL/kg) 2224.5 (29.9) 600 (8.1)   Blood 1908    Other  350   IV Piggyback 304    TPN 1680 420   Total Intake(mL/kg) 6116.5 (82.1) 1470 (19.7)   Urine (mL/kg/hr) 1700 (1) 700 (1.5)   Emesis/NG output 2500 1500   Drains 101    Stool 0    Total Output 4301 2200   Net +1815.5 -730        Stool Occurrence 2 x  Foley:    A:   Hyponatremia Hypophosphatemia  P:   -TNA per Pharmacy >> fats and pepcid removed to avoid thrombocytopenia -f/u chem    GASTROINTESTINAL  Lab 02/27/12 0500 02/24/12 0500  AST 12 16  ALT 11 13  ALKPHOS 47 51  BILITOT 0.4 0.9  PROT 4.6* 7.3  ALBUMIN 1.8* 2.0*   CT ABD/Pelvis 6/7>>>Multiple persistent abscess collections in the upper abdomen and pelvis, including persistent subcapsular collections at the liver  and spleen. Persistent rectal wall thickening.  Mild degree of wall of small bowel distention could reflect a  partial degree of obstruction or ileus.   A:   Abdominal Pain. S/P sigmoid colectomy with primary anastomosis and diverting ileostomy. Probable anastomotic leak,  multiple inta abd abscesses.  S/p new drain placement in IR 6/8 now with total 4 drains.  P: -pain control  -Recs per [facility]CS; see below  -WOC for ileostomy  A: ? Ileus/nausea/vomiting P: -unable to place NGT d/t thrombocytopenia -Zofran, phenergan PRN  -Keep NPO -if plt cont to trend up may be candidate for surgery next week per dr wyatt  -will try reglan   Rectal bleeding- continues to have large amt of BRB mixed with large clots  P:   -continue to monitor -f/u CBC -transfuse PRBC as needed  HEMATOLOGIC  Lab 02/29/12 0415 02/28/12 1825 02/28/12 0600 02/28/12 0010 02/27/12 1800 02/26/12 0400  HGB 8.1* 5.6* 7.0* 6.1* 6.7* --  HCT 23.3* 16.2* 20.2* 17.4* 19.1* --  PLT 17* 10* <5* <5* <5* --  INR -- -- 1.33 -- -- 1.39  APTT -- -- 32 -- -- 28   Hematology panel 6/1: no schitocytes, haptoglogin 154, fibrinogen 410  A:   Refractory Thrombocytopenia, suspect d/t consumption vs. Drug related (? Cephlosporin).  Cannot r/o antibody induced drop. S/p chemo / radiation for cervical cancer. Last treatment in 10/2010. Appreciate Dr Precious Reel input - no evidence for TTP, HITT, or DIC. 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 Suspect due to meds, but all possible culprits have been stopped. Has failed Amicar, IVIg, and steroid treatment.  P: -f/u hematology recs -transfuse plts per hematology -Amicar dosing per H/O -f/u cbc   Anemia -secondary to bleeding from nose and rectum, 2 u PRBCs 6/3, 6/6 P: -Nasal packing per ENT - think this bleeding has stopped  -f/u CBC -transfuse for HGB <7 -limit lab draws -Caution for toxic shock from nasal packing  INFECTIOUS  Lab 02/29/12 0415 02/28/12 1825 02/28/12 0600 02/28/12 0010 02/27/12 1800  WBC 10.8* 11.6* 12.6* 15.1* 16.7*  PROCALCITON -- -- -- -- --   Cultures: UC 5/6>>>neg 5/13 MRSA PCR>>>neg 5/23 Abd abscess drain>>>multiple organisims 5/23 Abd drainage>>>few e-Coli 5/23 RU abd drain>>>rare viridans,  abundant bacteroides fragilis, beta lactam positive 6/2 HIV>>> neg  Antibiotics: Augmentin 5/20>>5/22 Cefoxitin 5/13>>>5/15 Rocephin 5/6>>>5/8, 5/30>>>6/3 Cipro 5/29>>>5/30 Flagyl 5/29>>>6/5 Zosyn 5/22>>>5/29 Levaquin 6/3>>>6/5  A:   Abdominal Abscess - IR drains in place P:   -All ABX d/c'd 6/5 in small chance they are causing persistant thrombocytopenia with possible rebound of plt 6/8 so will cont to hold abx for now  -follow closely for signs evolving infection -note at high risk toxic shock syndrome with nasal packing and no abx -per CCS when to go to OR -if decompensation then restart abx as above   ENDOCRINE  Lab 02/29/12 0350 02/29/12 0023 02/28/12 1554 02/28/12 1158 02/28/12 0823  GLUCAP 120* 135* 122* 145* 124*   A:   Hyperglycemia- well  controled  P:   -monitor CBG glucose -Continue SSI  NEUROLOGIC  A:  Anxiety  P:   -supportive care -PRN ativan   BEST PRACTICE / DISPOSITION Level of Care:  ICU Primary Service:  CCS Consultants:  PCCM, Hematology, GI Code Status:  Full Diet:  Clear DVT Px:  scd's GI Px:  pepcid Skin Integrity:  intact Social / Family: updates family at bedside 6/8   [facility], NP 02/29/2012  1:21 PM Pager: [phone]  *Care during the described time interval was provided by me and/or other providers on the critical care team. I have reviewed this patient's available data, including medical history, events of note, physical examination and test results as part of my evaluation.  Continue to hold off abx, surprisingly the platelets are actually increasing now.  Very easy for her to get infected, will monitor for signs of infections very closely.  Patient seen and examined, agree with above note.  I dictated the care and orders written for this patient under my direction.  Koren Bound, M.D. [phone]

## 2012-02-29 NOTE — Progress Notes (Signed)
Pharmacy note:  Pain management  Asked to assist with Dilaudid PCA management starting 6/7.  Ms. Krauss was on MS contin 15mg  at bedtime and 30mg  in the morning.  This has been causing nausea and vomiting and was stopped on 6/7.    Current PCA settings:  Basal rate 0.4mg /hr, bolus dose 0.3mg  every 8 minutes with 4 hour lockout of 5mg .  Pain score reported as 5 which is improved from an 8.  Patient is currently asleep and appears comfortable  Plan:  Continue the same PCA settings.  Celedonio Miyamoto, PharmD, BCPS Clinical Pharmacist Pager [phone]

## 2012-02-29 NOTE — Progress Notes (Signed)
eLink Physician-Brief Progress Note Patient Name: Vanessa [name] DOB: [birth year] MRN: [id number]  Date of Service  02/29/2012   HPI/Events of Note   Plts improved to 38k Hb drop again  eICU Interventions  Transfuse 1 u D/w Dr Darnelle Catalan - hold off plts Chk & follow pct as sign of infection (dexa may mask)   Intervention Category Intermediate Interventions: Bleeding - evaluation and treatment with blood products  Jimena Wieczorek V. 02/29/2012, 6:52 PM

## 2012-02-29 NOTE — Progress Notes (Signed)
PARENTERAL NUTRITION CONSULT NOTE - FOLLOW UP  Pharmacy Consult for TPN Indication: intraabdominal abscess, ileus  Allergies  Allergen Reactions  . Labetalol Hcl Itching and Other (See Comments)    Bumps to bilateral arms.  . Morphine And Related Hives   Patient Measurements: Height: 5\' 4"  (162.6 cm) Weight: 164 lb 3.2 oz (74.481 kg) IBW/kg (Calculated) : 54.7   Vital Signs: Temp: 98.4 F (36.9 C) (06/08 0351) Temp src: Oral (06/08 0351) BP: 133/77 mmHg (06/08 0600) Pulse Rate: 90  (06/08 0600) Intake/Output from previous day: 06/07 0701 - 06/08 0700 In: 6106.5 [I.V.:2284.5; Blood:1908; IV Piggyback:304; TPN:1610] Out: 4301 [Urine:1700; Emesis/NG output:2500; Drains:101] Intake/Output from this shift:  Labs:  Basename 02/29/12 0415 02/28/12 1825 02/28/12 0600  WBC 10.8* 11.6* 12.6*  HGB 8.1* 5.6* 7.0*  HCT 23.3* 16.2* 20.2*  PLT 17* 10* <5*  APTT -- -- 32  INR -- -- 1.33    Basename 02/29/12 0415 02/28/12 0600 02/27/12 0500  NA 131* 132* 132*  K 4.1 3.8 4.2  CL 97 98 100  CO2 30 30 27   GLUCOSE 115* 125* 122*  BUN 11 10 14   CREATININE 0.32* 0.32* 0.36*  LABCREA -- -- --  CREAT24HRUR -- -- --  CALCIUM 9.8 9.5 9.7  MG -- 1.6 1.4*  PHOS -- 3.0 2.2*  PROT -- -- 4.6*  ALBUMIN -- -- 1.8*  AST -- -- 12  ALT -- -- 11  ALKPHOS -- -- 47  BILITOT -- -- 0.4  BILIDIR -- -- --  IBILI -- -- --  PREALBUMIN -- -- --  TRIG -- -- --  CHOLHDL -- -- --  CHOL -- -- --   Estimated Creatinine Clearance: 96.1 ml/min (by C-G formula based on Cr of 0.32).   Basename 02/29/12 0350 02/29/12 0023 02/28/12 1554  GLUCAP 120* 135* 122*   Medications:  Scheduled:     . alteplase  2 mg Intracatheter Once  . aminocaproic acid  5-15 mL Oral TID  . aminocaproic acid  1 g Oral Q4H  . barrier cream  1 application Topical TID  . feeding supplement  1 Container Oral BID WC  . fentaNYL      . fentaNYL      . folic acid  1 mg Intravenous Daily  . HYDROmorphone PCA 0.3 mg/mL    Intravenous Q4H  . IMMUNE GLOBULIN 10% (HUMAN) IV - For Fluid Restriction Only  400 mg/kg Intravenous Q24H  . insulin aspart  0-9 Units Subcutaneous Q4H  . midazolam      . midazolam      . multivitamin with minerals  1 tablet Oral Daily  . oxymetazoline  2 spray Each Nare BID  . romiPLOStim  6 mcg/kg Subcutaneous Once  . sodium chloride  10-40 mL Intracatheter Q12H  . DISCONTD: HYDROmorphone PCA 0.3 mg/mL   Intravenous Q4H  . DISCONTD: morphine  15 mg Oral QHS  . DISCONTD: morphine  30 mg Oral Daily   Infusions:     . sodium chloride 20 mL/hr at 02/28/12 2000  . 0.9 % sodium chloride with kcl 80 mL/hr at 02/28/12 2000  . TPN (CLINIMIX) +/- additives 70 mL/hr at 02/27/12 1800  . TPN (CLINIMIX) +/- additives 70 mL/hr at 02/28/12 1742    Insulin Requirements in the past 24 hours:  2 units SSI with NO insulin in TNA with q4h sensitive SSI  Current Nutrition:  - Clinimix E5/20 at 70 ml/hr (at goal) - Unable to provide lipids due to possible  contribution to thrombocytopenia.   - Resource Breeze PO BID added 6/5 to maximize oral intake **Note: TPN stopped 6/6 ~9 hours early per MD orders d/t inclusion of famotidine.  Future TPN bags will not include famotidine d/t risk of thrombocytopenia (<1%).  D10 @70ml /hr started until 1800. **   Nutritional Goals:  1800-2000 kCal, 80-95 grams of protein per day.    Clinimix E5/20 goal rate of 46ml/hr will provide an average of 1478 kcal and 84g protein per day based on RD assessment (82% total kcal and 100% protein needs)  Assessment: 60 yof with a history of cervical cancer who is s/p sigmoid colectomy and diverting ileostomy on 5/14. Developed multiple intra-abdominal abscesses 2/2 anastomotic leak shown on CT and had 3 perc drains placed. On 5/29 patient had multiple blood BM, hemoptysis, and abdominal pain. Plts dropped to <5 (240k on 5/25) but have now improved to 17. CT 5/30 showed additional abscesses and concern for fistula formation.    Anticoag:  Persistent thrombocytopenia but starting to improve (plts 17) and anemia (hgb 8.1 s/p PRBC) with rectal bleeding and epistaxis (bleeding around her nasal packing has decreased). ID suspects plt drop from beta-lactams, other possibilities include consumption vs antibody induced drop. No evidence of TTP,HITT, or DIC.  Indirect platelet IgG negative. HLA typing likely not available until 6/7.  S/p platelets (5/29: 1 unit, 5/30: 3 units, 5/31: 4 units, 6/1: 1 unit, 6/4: 2units), PRBC (5/29:2 units, 5/31: 2 units, 6/1: 2 units, 6/2: 3 units, 6/3: 2 units, 6/4: 3 units, 6/5: 2 units, 6/6: 2 units), FFP (5/30: 2 units, 6/1: 2 units), and tranexamic acid (6/3: 10mg /kg IV load, then 2mg /kg/hr x 2 hours). Plan for IVIG and NPlate today.  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 Anastomotic breakdown leading to many intra-abdominal abscesses and concern for fistula on recent CT 5/30. Perc drains in place with 46ml/24hr drainage. Pt may need more drains placed but unable to take to surgery with low plt count. Pt with significant amount of hemoptysis/hematochezia. Unknown GI absorption. CT scan abdomen and pelvis 6/6 - Continues to show persistent abscess collections in the upper abd./pelvis Endo: No hx DM. CBG adequately controlled; Steroids d/c'd 6/5.  Isolated BG 586 contaminated from TPN line - redraw CBG normal@ 129.  Requiring minimal insulin.  Lytes: 1st BMET 6/5 drawn from TPN line and contaminated. Lytes today WNL except Na is 131. Continues on NS w/45mEqK @80ml /hr.  CorrCa elevated at 11.56 (Ca x Phos <55) Renal: SCr WNL, UOP 1 ml/kg/hr.  I/O: +2 (received several blood product infusions). Prealbumin rising.  Pulm: RA Cards: BP ok, some tachycardia with prn hydralazine (allergy to BB, HCTZ d/c'd d/t possible contribution to thrombocytopenia) Hepatobil: Alk phos/LFTs/bili wnl from labs on 6/6. Albumin remains low.  Triglycerides slightly increased, will follow.  Cholesterol  noted. Neuro: standing po morphine BID and dilaudid PCA  Heme/Onc: Hx of cervical cancer, plts 5/25 at 240 then dropped to <5 but now up to 17 after many PRBC, plts, TXA, IVIG and Nplate.  Heme/onc consulted - unclear cause, suspect consumption. Unclear if/when OR procedure possible -plan to wait in hopes of plts rising >50K. Nplate weekly (6/1) and PO amicar started 6/4. Plan for 2nd dose of Nplate today.     ID: Pt with known peritoneal infection now off all Abx (CCM stopped all medications that could induce thrombocytopenia); WBC remains elevated, stable; afebrile; s/p IVIG x 2 doses - 6/1, 6/2.  ID thinks thrombocytopenia may be d/t beta-lactams given  through 6/3. May restart abx soon if not platelet improvement.      Rocephin 5/5 >> 5/8; 5/30>>6/3 Cefoxitin 5/14 >> 5/15 Augmentin 5/20 >> 5/21 Zosyn 5/22 >> 5/29 Cipro 5/29 >>5/30 Flagyl 5/29 >>6/5 Levofloxacin 6/3 >>6/4  Micro: 5/23 RUQ Abscess: rare viridans strep; B. Fragilis (no sens) 5/23 LLQ Abscess: few E.coli (pan sens) 5/23 LUQ Abscess: multiple bacteria 5/14 MRSA PCR neg 5/6 Urine: neg  Best Practices: SCDs  Plan:  - Continue Clinimix E 5/20 at goal of 70 ml/hr  - No famotidine in TNA (<1% chance of thrombocytopenia) - TE MWF only 2/2 Sport and exercise psychologist.  Fat emulsion omitted d/t to chance of worsening thrombocytopenia. Will f/u ability to restart fats as appropriate. - Continue PO MVI 2/2 national shortage of IV MVI - f/u am Mag, BMP, CBGs, phos  [name], PharmD, BCPS Pager # [phone] 02/29/2012 7:38 AM

## 2012-02-29 NOTE — Progress Notes (Signed)
25 Days Post-Op  Subjective: abd drains re evaluated 6/7 Upsize of LLQ drain and placed to bag; New LUQ drain Total of 4 drains in place Pt still very sick; Nausea +abd pain  Objective: Vital signs in last 24 hours: Temp:  [97.7 F (36.5 C)-98.9 F (37.2 C)] 98.1 F (36.7 C) (06/08 0800) Pulse Rate:  [89-115] 89  (06/08 0800) Resp:  [11-25] 14  (06/08 0854) BP: (104-143)/(55-98) 125/87 mmHg (06/08 0800) SpO2:  [97 %-100 %] 100 % (06/08 0854) Last BM Date: 02/26/12  Intake/Output from previous day: 06/07 0701 - 06/08 0700 In: 6106.5 [I.V.:2284.5; Blood:1908; IV Piggyback:304; TPN:1610] Out: 4301 [Urine:1700; Emesis/NG output:2500; Drains:101] Intake/Output this shift:    PE:  RUQ drain: serous bloody fluid: 10 cc in JP         LUQ #1: yellow cloudy fluid: 15 cc in JP         LUQ #2: serous fluid: 10 cc in JP         LLQ drain: purulent bloody fluid to bag Sites all intact; bandages clean; bloody at base of abd Sites all only sl tender to touch Wbc 10.8 Afeb; vss  Lab Results:   [facility] 02/29/12 0415 02/28/12 1825  WBC 10.8* 11.6*  HGB 8.1* 5.6*  HCT 23.3* 16.2*  PLT 17* 10*   BMET  Basename 02/29/12 0415 02/28/12 0600  NA 131* 132*  K 4.1 3.8  CL 97 98  CO2 30 30  GLUCOSE 115* 125*  BUN 11 10  CREATININE 0.32* 0.32*  CALCIUM 9.8 9.5   PT/INR  Basename 02/28/12 0600  LABPROT 16.7*  INR 1.33   ABG No results found for this basename: PHART:2,PCO2:2,PO2:2,HCO3:2 in the last 72 hours  Studies/Results: Ct Guided Abscess Drain  02/28/2012  *RADIOLOGY REPORT*  Clinical history:Complex medical history with multiple abdominal abscesses status post bowel surgery.  History of cervical cancer and severe thrombocytopenia.  PROCEDURE(S): CT GUIDED EXCHANGE OF LOWER ABDOMINAL ABSCESS CATHETER; PLACEMENT OF A NEW LEFT UPPER ABDOMINAL ABSCESS DRAIN WITH CT GUIDANCE  Physician: Rachelle Hora. Lowella Dandy, MD and Malachy Moan, MD  Medications:Versed 8 mg, Fentanyl 300 mcg. A  radiology nurse monitored the patient for moderate sedation. The patient was given platelets before and during the procedure.  Moderate sedation time:55 minutes  Fluoroscopy time: 22 seconds CT fluoroscopy  Procedure:Informed consent was obtained for CT guided drain placements.  CT images through the abdomen were obtained.  The left lower quadrant drain was prepped and draped in a sterile fashion. The suture was removed.  The catheter was cut and a stiff Amplatz wire was advanced into the lower abdomen.  The catheter removed over wire.  A new 14-French pigtail catheter was placed over a wire and advanced into the right lower abdominal cavity.  Thick bloody fluid was aspirated.  The catheter was spun around and manipulated within the abdominal cavity.  The catheter was unable to be advanced cephalad.  Eventually, the catheter was left at the midline.  Catheter was sutured to the skin with a Prolene suture. The catheter was attached to a gravity bag.  Attention was directed to the left upper quadrant abscess collection.  The left lateral abdomen was prepped and draped in a sterile fashion and skin was anesthetized with lidocaine.  18 gauge needle was directed into the fluid collection posterior to the distended stomach.  Thick purulent yellow fluid was aspirated. Stiff Amplatz wire was placed.  A 12-French Renee Pain catheter was advanced into the collection and 45  ml of thick yellow fluid was removed.  Catheter was attached to a suction bulb.  Findings:The patient has complex air-fluid collection in the lower abdomen and pelvis.  The new drainage catheter was advanced further into this collection and additional thick bloody fluid was removed. Catheter was left near the midline.  A new 12-French drain was placed in the left upper abdominal fluid collection which is just posterior to the distended stomach.  Complications: None  Impression:Successful manipulation and upsizing of the left lower quadrant abdominal  abscess drain.  Drain was upsized to a 14-French catheter.  Catheter was attached to a gravity bag.  Placement of a new 12-French drainage catheter within the left upper quadrant abscess.  Sample sent for culture.  Original Report Authenticated By: Richarda Overlie, M.D.   Ct Abdomen Pelvis W Contrast  02/27/2012  *RADIOLOGY REPORT*  Clinical Data: Anemia, nausea, history of cervical cancer post radiation therapy, radiation colitis, rectal bleeding, intra- abdominal/intrapelvic abscesses, drainage catheters  CT ABDOMEN AND PELVIS WITH CONTRAST  Technique:  Multidetector CT imaging of the abdomen and pelvis was performed following the standard protocol during bolus administration of intravenous contrast. Sagittal and coronal MPR images reconstructed from axial data set.  Contrast: 80mL OMNIPAQUE IOHEXOL 300 MG/ML SOLN and dilute oral contrast  Comparison: 02/19/2012  Findings: Bibasilar atelectasis. Subcapsular collections identified at liver and the spleen question abscesses. Two subcapsular splenic collections are present, superiorly lenticular shape 10.9 x 1.4 x 2.5 cm and inferolaterally more ovoid 5.6 x 2.2 x 5.4 cm. The hepatic collection measures 4.1 x 1.4 cm at its greatest axial dimensions though this extends laterally along the liver for 13 cm length. Decreased gas within the sub capsular hepatic collection. Splenic collections appear minimally decreased in size. Few scattered small interloop collections question tiny cyst is little changed  Pigtail drainage catheters are identified at three previously seen collections in the sub hepatic space, left mid abdomen, and left pelvis with interval decrease in size of the subglottic and to the left pelvic collections. Low residual left pelvic collection measures approximately 10.2 x 3.5 cm in greatest axial dimensions. Little change in the size of an additional subtle high collection 4.9 x 1.6 cm image 48. Right lower quadrant multiloculated collection identified in  aggregate measuring 4.4 x 5.2 cm greatest axial dimensions and extending 6.3 cm length by slightly decreased. Small amount gas/fluid within the anterior abdominal wound at the umbilicus. Additional left upper quadrant abscess collection is little changed, images 33 - 44. An additional loculated retroperitoneal collection has decreased in size, 5.0 x 2.1 cm image 51, previously in excess of 7.7 x 3.6 cm.  Diffuse infiltration of poorly defined tissue planes in the pelvis may be related to prior surgery, radiation therapy and tumor. Significant rectal wall thickening and infiltration of the presacral space again seen.  Right lower quadrant ileostomy. No definite free intraperitoneal air. Bowel loops appear mildly dilated proximally, without focal point of obstruction. Mild bilateral hydronephrosis and proximal ureteral dilatation. No acute osseous findings. Inhomogeneous attenuation of the L4-L5 vertebral bodies as well as sacrum, could reflect radiation therapy change or tumor.  IMPRESSION: Multiple persistent abscess collections in the upper abdomen and pelvis, including persistent subcapsular collections at the liver and spleen. Decrease in size of the collections in the left pelvis and in the sub hepatic space post percutaneous drainage. Persistent rectal wall thickening. Mild degree of wall of small bowel distention could reflect a partial degree of obstruction or ileus. Lower attenuation of the L4-  L5 vertebral bodies as well as portions of the sacrum could be related to metastatic disease or post radiation therapy change, recommend correlation with radiation port.  Original Report Authenticated By: Lollie Marrow, M.D.   Ct Abscess Cath Exchange  02/28/2012  *RADIOLOGY REPORT*  Clinical history:Complex medical history with multiple abdominal abscesses status post bowel surgery.  History of cervical cancer and severe thrombocytopenia.  PROCEDURE(S): CT GUIDED EXCHANGE OF LOWER ABDOMINAL ABSCESS CATHETER;  PLACEMENT OF A NEW LEFT UPPER ABDOMINAL ABSCESS DRAIN WITH CT GUIDANCE  Physician: Rachelle Hora. Lowella Dandy, MD and Malachy Moan, MD  Medications:Versed 8 mg, Fentanyl 300 mcg. A radiology nurse monitored the patient for moderate sedation. The patient was given platelets before and during the procedure.  Moderate sedation time:55 minutes  Fluoroscopy time: 22 seconds CT fluoroscopy  Procedure:Informed consent was obtained for CT guided drain placements.  CT images through the abdomen were obtained.  The left lower quadrant drain was prepped and draped in a sterile fashion. The suture was removed.  The catheter was cut and a stiff Amplatz wire was advanced into the lower abdomen.  The catheter removed over wire.  A new 14-French pigtail catheter was placed over a wire and advanced into the right lower abdominal cavity.  Thick bloody fluid was aspirated.  The catheter was spun around and manipulated within the abdominal cavity.  The catheter was unable to be advanced cephalad.  Eventually, the catheter was left at the midline.  Catheter was sutured to the skin with a Prolene suture. The catheter was attached to a gravity bag.  Attention was directed to the left upper quadrant abscess collection.  The left lateral abdomen was prepped and draped in a sterile fashion and skin was anesthetized with lidocaine.  18 gauge needle was directed into the fluid collection posterior to the distended stomach.  Thick purulent yellow fluid was aspirated. Stiff Amplatz wire was placed.  A 12-French Renee Pain catheter was advanced into the collection and 45 ml of thick yellow fluid was removed.  Catheter was attached to a suction bulb.  Findings:The patient has complex air-fluid collection in the lower abdomen and pelvis.  The new drainage catheter was advanced further into this collection and additional thick bloody fluid was removed. Catheter was left near the midline.  A new 12-French drain was placed in the left upper abdominal fluid  collection which is just posterior to the distended stomach.  Complications: None  Impression:Successful manipulation and upsizing of the left lower quadrant abdominal abscess drain.  Drain was upsized to a 14-French catheter.  Catheter was attached to a gravity bag.  Placement of a new 12-French drainage catheter within the left upper quadrant abscess.  Sample sent for culture.  Original Report Authenticated By: Richarda Overlie, M.D.    Anti-infectives:   Assessment/Plan: s/p Procedure(s) (LRB): COLON RESECTION SIGMOID (N/A)  abd drains reevaluated 6/7 Upsize of LLQ and placed to bag; new drain in LUQ Total of 4 drains in place Will follow Plan per [facility]  Guadalupe Nickless A 02/29/2012

## 2012-02-29 NOTE — Progress Notes (Signed)
[facility] Progress Note 25 Days Post-Op  Subjective: Patient vomited about 900cc bilious fluid overnight.  Cannot place NGT because of profound thrombocytopenia.  Very somnolent this morning.    Objective: Vital signs in last 24 hours: Temp:  [97.7 F (36.5 C)-98.9 F (37.2 C)] 98.1 F (36.7 C) (06/08 0800) Pulse Rate:  [89-115] 89  (06/08 0800) Resp:  [11-25] 12  (06/08 0800) BP: (104-143)/(55-98) 125/87 mmHg (06/08 0800) SpO2:  [97 %-100 %] 100 % (06/08 0800) Last BM Date: 02/26/12  Intake/Output from previous day: 06/07 0701 - 06/08 0700 In: 6106.5 [I.V.:2284.5; Blood:1908; IV Piggyback:304; TPN:1610] Out: 4301 [Urine:1700; Emesis/NG output:2500; Drains:101] Intake/Output this shift:    General: Afebrile.  VEry sleepy.  On PCA.  Generalized foul smell in her room.  Lungs: Clear  Abd: Soft distended, hypoactive but present bowel sounds.  Total drain output 101cc since yesterday.  Not very feculent.  Ileostomy has not been draining for the last two days.  Extremities: No changes  Neuro: Sleep, but appropriate when awakened.  Lab Results:  @LABLAST2 (wbc:2,hgb:2,hct:2,plt:2) BMET  Basename 02/29/12 0415 02/28/12 0600  NA 131* 132*  K 4.1 3.8  CL 97 98  CO2 30 30  GLUCOSE 115* 125*  BUN 11 10  CREATININE 0.32* 0.32*  CALCIUM 9.8 9.5   PT/INR  Basename 02/28/12 0600  LABPROT 16.7*  INR 1.33   ABG No results found for this basename: PHART:2,PCO2:2,PO2:2,HCO3:2 in the last 72 hours  Studies/Results: Ct Guided Abscess Drain  02/28/2012  *RADIOLOGY REPORT*  Clinical history:Complex medical history with multiple abdominal abscesses status post bowel surgery.  History of cervical cancer and severe thrombocytopenia.  PROCEDURE(S): CT GUIDED EXCHANGE OF LOWER ABDOMINAL ABSCESS CATHETER; PLACEMENT OF A NEW LEFT UPPER ABDOMINAL ABSCESS DRAIN WITH CT GUIDANCE  Physician: Rachelle Hora. Lowella Dandy, MD and Malachy Moan, MD  Medications:Versed 8 mg, Fentanyl 300 mcg. A radiology nurse  monitored the patient for moderate sedation. The patient was given platelets before and during the procedure.  Moderate sedation time:55 minutes  Fluoroscopy time: 22 seconds CT fluoroscopy  Procedure:Informed consent was obtained for CT guided drain placements.  CT images through the abdomen were obtained.  The left lower quadrant drain was prepped and draped in a sterile fashion. The suture was removed.  The catheter was cut and a stiff Amplatz wire was advanced into the lower abdomen.  The catheter removed over wire.  A new 14-French pigtail catheter was placed over a wire and advanced into the right lower abdominal cavity.  Thick bloody fluid was aspirated.  The catheter was spun around and manipulated within the abdominal cavity.  The catheter was unable to be advanced cephalad.  Eventually, the catheter was left at the midline.  Catheter was sutured to the skin with a Prolene suture. The catheter was attached to a gravity bag.  Attention was directed to the left upper quadrant abscess collection.  The left lateral abdomen was prepped and draped in a sterile fashion and skin was anesthetized with lidocaine.  18 gauge needle was directed into the fluid collection posterior to the distended stomach.  Thick purulent yellow fluid was aspirated. Stiff Amplatz wire was placed.  A 12-French Renee Pain catheter was advanced into the collection and 45 ml of thick yellow fluid was removed.  Catheter was attached to a suction bulb.  Findings:The patient has complex air-fluid collection in the lower abdomen and pelvis.  The new drainage catheter was advanced further into this collection and additional thick bloody fluid was removed. Catheter  was left near the midline.  A new 12-French drain was placed in the left upper abdominal fluid collection which is just posterior to the distended stomach.  Complications: None  Impression:Successful manipulation and upsizing of the left lower quadrant abdominal abscess drain.   Drain was upsized to a 14-French catheter.  Catheter was attached to a gravity bag.  Placement of a new 12-French drainage catheter within the left upper quadrant abscess.  Sample sent for culture.  Original Report Authenticated By: Richarda Overlie, M.D.   Ct Abdomen Pelvis W Contrast  02/27/2012  *RADIOLOGY REPORT*  Clinical Data: Anemia, nausea, history of cervical cancer post radiation therapy, radiation colitis, rectal bleeding, intra- abdominal/intrapelvic abscesses, drainage catheters  CT ABDOMEN AND PELVIS WITH CONTRAST  Technique:  Multidetector CT imaging of the abdomen and pelvis was performed following the standard protocol during bolus administration of intravenous contrast. Sagittal and coronal MPR images reconstructed from axial data set.  Contrast: 80mL OMNIPAQUE IOHEXOL 300 MG/ML SOLN and dilute oral contrast  Comparison: 02/19/2012  Findings: Bibasilar atelectasis. Subcapsular collections identified at liver and the spleen question abscesses. Two subcapsular splenic collections are present, superiorly lenticular shape 10.9 x 1.4 x 2.5 cm and inferolaterally more ovoid 5.6 x 2.2 x 5.4 cm. The hepatic collection measures 4.1 x 1.4 cm at its greatest axial dimensions though this extends laterally along the liver for 13 cm length. Decreased gas within the sub capsular hepatic collection. Splenic collections appear minimally decreased in size. Few scattered small interloop collections question tiny cyst is little changed  Pigtail drainage catheters are identified at three previously seen collections in the sub hepatic space, left mid abdomen, and left pelvis with interval decrease in size of the subglottic and to the left pelvic collections. Low residual left pelvic collection measures approximately 10.2 x 3.5 cm in greatest axial dimensions. Little change in the size of an additional subtle high collection 4.9 x 1.6 cm image 48. Right lower quadrant multiloculated collection identified in aggregate  measuring 4.4 x 5.2 cm greatest axial dimensions and extending 6.3 cm length by slightly decreased. Small amount gas/fluid within the anterior abdominal wound at the umbilicus. Additional left upper quadrant abscess collection is little changed, images 33 - 44. An additional loculated retroperitoneal collection has decreased in size, 5.0 x 2.1 cm image 51, previously in excess of 7.7 x 3.6 cm.  Diffuse infiltration of poorly defined tissue planes in the pelvis may be related to prior surgery, radiation therapy and tumor. Significant rectal wall thickening and infiltration of the presacral space again seen.  Right lower quadrant ileostomy. No definite free intraperitoneal air. Bowel loops appear mildly dilated proximally, without focal point of obstruction. Mild bilateral hydronephrosis and proximal ureteral dilatation. No acute osseous findings. Inhomogeneous attenuation of the L4-L5 vertebral bodies as well as sacrum, could reflect radiation therapy change or tumor.  IMPRESSION: Multiple persistent abscess collections in the upper abdomen and pelvis, including persistent subcapsular collections at the liver and spleen. Decrease in size of the collections in the left pelvis and in the sub hepatic space post percutaneous drainage. Persistent rectal wall thickening. Mild degree of wall of small bowel distention could reflect a partial degree of obstruction or ileus. Lower attenuation of the L4- L5 vertebral bodies as well as portions of the sacrum could be related to metastatic disease or post radiation therapy change, recommend correlation with radiation port.  Original Report Authenticated By: Lollie Marrow, M.D.   Ct Abscess Cath Exchange  02/28/2012  *RADIOLOGY REPORT*  Clinical history:Complex medical history with multiple abdominal abscesses status post bowel surgery.  History of cervical cancer and severe thrombocytopenia.  PROCEDURE(S): CT GUIDED EXCHANGE OF LOWER ABDOMINAL ABSCESS CATHETER; PLACEMENT OF A  NEW LEFT UPPER ABDOMINAL ABSCESS DRAIN WITH CT GUIDANCE  Physician: Rachelle Hora. Lowella Dandy, MD and Malachy Moan, MD  Medications:Versed 8 mg, Fentanyl 300 mcg. A radiology nurse monitored the patient for moderate sedation. The patient was given platelets before and during the procedure.  Moderate sedation time:55 minutes  Fluoroscopy time: 22 seconds CT fluoroscopy  Procedure:Informed consent was obtained for CT guided drain placements.  CT images through the abdomen were obtained.  The left lower quadrant drain was prepped and draped in a sterile fashion. The suture was removed.  The catheter was cut and a stiff Amplatz wire was advanced into the lower abdomen.  The catheter removed over wire.  A new 14-French pigtail catheter was placed over a wire and advanced into the right lower abdominal cavity.  Thick bloody fluid was aspirated.  The catheter was spun around and manipulated within the abdominal cavity.  The catheter was unable to be advanced cephalad.  Eventually, the catheter was left at the midline.  Catheter was sutured to the skin with a Prolene suture. The catheter was attached to a gravity bag.  Attention was directed to the left upper quadrant abscess collection.  The left lateral abdomen was prepped and draped in a sterile fashion and skin was anesthetized with lidocaine.  18 gauge needle was directed into the fluid collection posterior to the distended stomach.  Thick purulent yellow fluid was aspirated. Stiff Amplatz wire was placed.  A 12-French Renee Pain catheter was advanced into the collection and 45 ml of thick yellow fluid was removed.  Catheter was attached to a suction bulb.  Findings:The patient has complex air-fluid collection in the lower abdomen and pelvis.  The new drainage catheter was advanced further into this collection and additional thick bloody fluid was removed. Catheter was left near the midline.  A new 12-French drain was placed in the left upper abdominal fluid collection  which is just posterior to the distended stomach.  Complications: None  Impression:Successful manipulation and upsizing of the left lower quadrant abdominal abscess drain.  Drain was upsized to a 14-French catheter.  Catheter was attached to a gravity bag.  Placement of a new 12-French drainage catheter within the left upper quadrant abscess.  Sample sent for culture.  Original Report Authenticated By: Richarda Overlie, M.D.    Anti-infectives: Anti-infectives     Start     Dose/Rate Route Frequency Ordered Stop   02/24/12 1600   levofloxacin (LEVAQUIN) IVPB 750 mg  Status:  Discontinued        750 mg 100 mL/hr over 90 Minutes Intravenous Every 24 hours 02/24/12 1526 02/26/12 1012   02/20/12 1415   cefTRIAXone (ROCEPHIN) 1 g in dextrose 5 % 50 mL IVPB  Status:  Discontinued        1 g 100 mL/hr over 30 Minutes Intravenous Every 24 hours 02/20/12 1404 02/24/12 1526   02/19/12 1800   ciprofloxacin (CIPRO) IVPB 400 mg  Status:  Discontinued        400 mg 200 mL/hr over 60 Minutes Intravenous Every 12 hours 02/19/12 1612 02/20/12 1404   02/19/12 1700   metroNIDAZOLE (FLAGYL) IVPB 500 mg  Status:  Discontinued        500 mg 100 mL/hr over 60 Minutes Intravenous 3 times per day 02/19/12 1612 02/26/12  1012   02/12/12 0800   piperacillin-tazobactam (ZOSYN) IVPB 3.375 g  Status:  Discontinued        3.375 g 12.5 mL/hr over 240 Minutes Intravenous 3 times per day 02/12/12 0800 02/19/12 1612   02/10/12 1000   amoxicillin-clavulanate (AUGMENTIN) 875-125 MG per tablet 1 tablet  Status:  Discontinued        1 tablet Oral Every 12 hours 02/10/12 0823 02/12/12 0800   02/04/12 1800   cefOXitin (MEFOXIN) 1 g in dextrose 5 % 50 mL IVPB        1 g 100 mL/hr over 30 Minutes Intravenous Every 6 hours 02/04/12 1621 02/05/12 0617   02/04/12 0000   cefOXitin (MEFOXIN) 2 g in dextrose 5 % 50 mL IVPB        2 g 100 mL/hr over 30 Minutes Intravenous 60 min pre-op 02/03/12 0333 02/04/12 1145   02/03/12 0500    cefOXitin (MEFOXIN) 2 g in dextrose 5 % 50 mL IVPB  Status:  Discontinued        2 g 100 mL/hr over 30 Minutes Intravenous 60 min pre-op 02/02/12 1156 02/03/12 0333   02/03/12 0000   cefOXitin (MEFOXIN) 2 g in dextrose 5 % 50 mL IVPB  Status:  Discontinued        2 g 100 mL/hr over 30 Minutes Intravenous 60 min pre-op 02/02/12 1004 02/02/12 1156   01/27/12 2200   cefTRIAXone (ROCEPHIN) 1 g in dextrose 5 % 50 mL IVPB        1 g 100 mL/hr over 30 Minutes Intravenous Every 24 hours 01/27/12 2152 01/29/12 2359   01/27/12 1915   cefTRIAXone (ROCEPHIN) 1 g in dextrose 5 % 50 mL IVPB        1 g 100 mL/hr over 30 Minutes Intravenous  Once 01/27/12 1914 01/27/12 1957          Assessment/Plan: s/p Procedure(s): COLON RESECTION SIGMOID Nausea, high volume vomiting, on TPN Voiding without foley Platelet count up to 17K without transfusion of platelets since yesterday. AM. May need ileostomy irrigation, but would still wait until platelets increase. If her platelet count continues to improve, may be candidate for surgical exploration late next week.  LOS: 33 days   Marta Lamas. [name], MD, FACS [phone] [facility] 02/29/2012

## 2012-02-29 NOTE — Progress Notes (Signed)
Vanessa [name]   DOB:1975-10-11   ZO#:[id number]   JWJ#:[id number]  Subjective: alert; tells me she gets little benefit from PCA pushes; hungry and wants to eat; nauseated, still vomiting, per RN bilious matter; bleeding (rectal) decreased; family in room   Objective: young African American woman examined in bed Filed Vitals:   02/29/12 1730  BP: 133/88  Pulse: 104  Temp:   Resp: 17    Body mass index is 28.19 kg/(m^2).  Intake/Output Summary (Last 24 hours) at 02/29/12 1742 Last data filed at 02/29/12 1700  Gross per 24 hour  Intake 5104.56 ml  Output   6601 ml  Net -1496.44 ml     Sclerae unicteric  Oropharynx shows no thrush  Lungs auscultated anterolaterally -- no rales or rhonchi  Heart regular rate and rhythm  Abdomen soft, few BS  Neuro nonfocal    CBG (last 3)   Basename 02/29/12 1615 02/29/12 0350 02/29/12 0023  GLUCAP 118* 120* 135*     Labs:  Lab Results  Component Value Date   WBC 10.8* 02/29/2012   HGB 8.1* 02/29/2012   HCT 23.3* 02/29/2012   MCV 89.3 02/29/2012   PLT 17* 02/29/2012   NEUTROABS 13.6* 02/24/2012    Urine Studies No results found for this basename: UACOL:2,UAPR:2,USPG:2,UPH:2,UTP:2,UGL:2,UKET:2,UBIL:2,UHGB:2,UNIT:2,UROB:2,ULEU:2,UEPI:2,UWBC:2,URBC:2,UBAC:2,CAST:2,CRYS:2,UCOM:2,BILUA:2 in the last 72 hours  Basic Metabolic Panel:  Lab 02/29/12 6213 02/28/12 0600 02/27/12 0500 02/26/12 0524 02/26/12 0402 02/25/12 0444 02/24/12 0500  NA 131* 132* 132* 130* 127* -- --  K 4.1 3.8 -- -- -- -- --  CL 97 98 100 98 98 -- --  CO2 30 30 27 27 26  -- --  GLUCOSE 115* 125* 122* 124* 540* -- --  BUN 11 10 14 14 13  -- --  CREATININE 0.32* 0.32* 0.36* 0.35* 0.33* -- --  CALCIUM 9.8 9.5 9.7 10.5 10.0 -- --  MG -- 1.6 1.4* -- 1.5 1.5 1.5  PHOS -- 3.0 2.2* -- 3.2 2.2* 2.3   GFR Estimated Creatinine Clearance: 96.1 ml/min (by C-G formula based on Cr of 0.32). Liver Function Tests:  Lab 02/27/12 0500 02/24/12 0500  AST 12 16  ALT 11 13  ALKPHOS 47 51   BILITOT 0.4 0.9  PROT 4.6* 7.3  ALBUMIN 1.8* 2.0*   No results found for this basename: LIPASE:5,AMYLASE:5 in the last 168 hours No results found for this basename: AMMONIA:5 in the last 168 hours Coagulation profile  Lab 02/28/12 0600 02/26/12 0400  INR 1.33 1.39  PROTIME -- --    CBC:  Lab 02/29/12 0415 02/28/12 1825 02/28/12 0600 02/28/12 0010 02/27/12 1800 02/24/12 0500  WBC 10.8* 11.6* 12.6* 15.1* 16.7* --  NEUTROABS -- -- -- -- -- 13.6*  HGB 8.1* 5.6* 7.0* 6.1* 6.7* --  HCT 23.3* 16.2* 20.2* 17.4* 19.1* --  MCV 89.3 92.0 87.4 90.2 89.3 --  PLT 17* 10* <5* <5* <5* --   Cardiac Enzymes: No results found for this basename: CKTOTAL:5,CKMB:5,CKMBINDEX:5,TROPONINI:5 in the last 168 hours BNP: No components found with this basename: POCBNP:5 CBG:  Lab 02/29/12 1615 02/29/12 0350 02/29/12 0023 02/28/12 1554 02/28/12 1158  GLUCAP 118* 120* 135* 122* 145*   D-Dimer No results found for this basename: DDIMER:2 in the last 72 hours Hgb A1c No results found for this basename: HGBA1C:2 in the last 72 hours Lipid Profile No results found for this basename: CHOL:2,HDL:2,LDLCALC:2,TRIG:2,CHOLHDL:2,LDLDIRECT:2 in the last 72 hours Thyroid function studies No results found for this basename: TSH,T4TOTAL,FREET3,T3FREE,THYROIDAB in the last 72 hours Anemia work up No  results found for this basename: VITAMINB12:2,FOLATE:2,FERRITIN:2,TIBC:2,IRON:2,RETICCTPCT:2 in the last 72 hours Microbiology Recent Results (from the past 240 hour(s))  CULTURE, ROUTINE-ABSCESS     Status: Normal (Preliminary result)   Collection Time   02/28/12  3:52 PM      Component Value Range Status Comment   Specimen Description ABSCESS PERITONEAL CAVITY   Final    Special Requests LEFT UPPER QUADRANT ABSCESS,YELLOW PURULENT FLUID   Final    Gram Stain     Final    Value: ABUNDANT WBC PRESENT,BOTH PMN AND MONONUCLEAR     NO SQUAMOUS EPITHELIAL CELLS SEEN     ABUNDANT GRAM POSITIVE COCCI IN PAIRS      MODERATE GRAM NEGATIVE COCCOBACILLI   Culture Culture reincubated for better growth   Final    Report Status PENDING   Incomplete       Studies:  Ct Guided Abscess Drain  02/28/2012  *RADIOLOGY REPORT*  Clinical history:Complex medical history with multiple abdominal abscesses status post bowel surgery.  History of cervical cancer and severe thrombocytopenia.  PROCEDURE(S): CT GUIDED EXCHANGE OF LOWER ABDOMINAL ABSCESS CATHETER; PLACEMENT OF A NEW LEFT UPPER ABDOMINAL ABSCESS DRAIN WITH CT GUIDANCE  Physician: Rachelle Hora. Lowella Dandy, MD and Malachy Moan, MD  Medications:Versed 8 mg, Fentanyl 300 mcg. A radiology nurse monitored the patient for moderate sedation. The patient was given platelets before and during the procedure.  Moderate sedation time:55 minutes  Fluoroscopy time: 22 seconds CT fluoroscopy  Procedure:Informed consent was obtained for CT guided drain placements.  CT images through the abdomen were obtained.  The left lower quadrant drain was prepped and draped in a sterile fashion. The suture was removed.  The catheter was cut and a stiff Amplatz wire was advanced into the lower abdomen.  The catheter removed over wire.  A new 14-French pigtail catheter was placed over a wire and advanced into the right lower abdominal cavity.  Thick bloody fluid was aspirated.  The catheter was spun around and manipulated within the abdominal cavity.  The catheter was unable to be advanced cephalad.  Eventually, the catheter was left at the midline.  Catheter was sutured to the skin with a Prolene suture. The catheter was attached to a gravity bag.  Attention was directed to the left upper quadrant abscess collection.  The left lateral abdomen was prepped and draped in a sterile fashion and skin was anesthetized with lidocaine.  18 gauge needle was directed into the fluid collection posterior to the distended stomach.  Thick purulent yellow fluid was aspirated. Stiff Amplatz wire was placed.  A 12-French Renee Pain catheter was advanced into the collection and 45 ml of thick yellow fluid was removed.  Catheter was attached to a suction bulb.  Findings:The patient has complex air-fluid collection in the lower abdomen and pelvis.  The new drainage catheter was advanced further into this collection and additional thick bloody fluid was removed. Catheter was left near the midline.  A new 12-French drain was placed in the left upper abdominal fluid collection which is just posterior to the distended stomach.  Complications: None  Impression:Successful manipulation and upsizing of the left lower quadrant abdominal abscess drain.  Drain was upsized to a 14-French catheter.  Catheter was attached to a gravity bag.  Placement of a new 12-French drainage catheter within the left upper quadrant abscess.  Sample sent for culture.  Original Report Authenticated By: Richarda Overlie, M.D.   Ct Abscess Cath Exchange  02/28/2012  *RADIOLOGY REPORT*  Clinical history:Complex  medical history with multiple abdominal abscesses status post bowel surgery.  History of cervical cancer and severe thrombocytopenia.  PROCEDURE(S): CT GUIDED EXCHANGE OF LOWER ABDOMINAL ABSCESS CATHETER; PLACEMENT OF A NEW LEFT UPPER ABDOMINAL ABSCESS DRAIN WITH CT GUIDANCE  Physician: Rachelle Hora. Lowella Dandy, MD and Malachy Moan, MD  Medications:Versed 8 mg, Fentanyl 300 mcg. A radiology nurse monitored the patient for moderate sedation. The patient was given platelets before and during the procedure.  Moderate sedation time:55 minutes  Fluoroscopy time: 22 seconds CT fluoroscopy  Procedure:Informed consent was obtained for CT guided drain placements.  CT images through the abdomen were obtained.  The left lower quadrant drain was prepped and draped in a sterile fashion. The suture was removed.  The catheter was cut and a stiff Amplatz wire was advanced into the lower abdomen.  The catheter removed over wire.  A new 14-French pigtail catheter was placed over a wire and  advanced into the right lower abdominal cavity.  Thick bloody fluid was aspirated.  The catheter was spun around and manipulated within the abdominal cavity.  The catheter was unable to be advanced cephalad.  Eventually, the catheter was left at the midline.  Catheter was sutured to the skin with a Prolene suture. The catheter was attached to a gravity bag.  Attention was directed to the left upper quadrant abscess collection.  The left lateral abdomen was prepped and draped in a sterile fashion and skin was anesthetized with lidocaine.  18 gauge needle was directed into the fluid collection posterior to the distended stomach.  Thick purulent yellow fluid was aspirated. Stiff Amplatz wire was placed.  A 12-French Renee Pain catheter was advanced into the collection and 45 ml of thick yellow fluid was removed.  Catheter was attached to a suction bulb.  Findings:The patient has complex air-fluid collection in the lower abdomen and pelvis.  The new drainage catheter was advanced further into this collection and additional thick bloody fluid was removed. Catheter was left near the midline.  A new 12-French drain was placed in the left upper abdominal fluid collection which is just posterior to the distended stomach.  Complications: None  Impression:Successful manipulation and upsizing of the left lower quadrant abdominal abscess drain.  Drain was upsized to a 14-French catheter.  Catheter was attached to a gravity bag.  Placement of a new 12-French drainage catheter within the left upper quadrant abscess.  Sample sent for culture.  Original Report Authenticated By: Richarda Overlie, M.D.    Assessment: 36 y.o. 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 (no schistocytes on smear, no clinical DIC, normal INR and APTT, negative HIT screen), s/p multiple platelet transfusions without "bump",  treated with steroids, IVIG [currently day 3], romiplostim (Nplate, given 06/01 and 02/29/2012), also on amicar  (3) anemia secondary to GIB  (4) malnutrition  (5) moderately controlled pain  Plan: we may finally be seeing some improvement in the platelet count; she might benefit from an ng but platelets still too low; I will add dexamethasone for nausea, and may also help with platelets; would reassess daily as may mask fever and other abdominal symptoms;  also added compazine. Greatly appreciate intensivists' help to this patient and her family!   Lowella Dell MD 02/29/2012

## 2012-02-29 NOTE — Progress Notes (Signed)
INFECTIOUS DISEASE PROGRESS NOTE  ID: Vanessa [name] is a 36 y.o. female with a history of cervical cancer s/p XRT and presented 5/7 with N/V and underwent colectomy and diverting ileostomy due to abnormal CT scan.  Had abscess collections noted on CT and has had drains placed.  Developed significant thrombocytopenia and antibiotics have been held to rule out antibiotic induced.    Subjective: Continued nausea, abdominal pain  Abtx:  Anti-infectives     Start     Dose/Rate Route Frequency Ordered Stop   02/24/12 1600   levofloxacin (LEVAQUIN) IVPB 750 mg  Status:  Discontinued        750 mg 100 mL/hr over 90 Minutes Intravenous Every 24 hours 02/24/12 1526 02/26/12 1012   02/20/12 1415   cefTRIAXone (ROCEPHIN) 1 g in dextrose 5 % 50 mL IVPB  Status:  Discontinued        1 g 100 mL/hr over 30 Minutes Intravenous Every 24 hours 02/20/12 1404 02/24/12 1526   02/19/12 1800   ciprofloxacin (CIPRO) IVPB 400 mg  Status:  Discontinued        400 mg 200 mL/hr over 60 Minutes Intravenous Every 12 hours 02/19/12 1612 02/20/12 1404   02/19/12 1700   metroNIDAZOLE (FLAGYL) IVPB 500 mg  Status:  Discontinued        500 mg 100 mL/hr over 60 Minutes Intravenous 3 times per day 02/19/12 1612 02/26/12 1012   02/12/12 0800   piperacillin-tazobactam (ZOSYN) IVPB 3.375 g  Status:  Discontinued        3.375 g 12.5 mL/hr over 240 Minutes Intravenous 3 times per day 02/12/12 0800 02/19/12 1612   02/10/12 1000   amoxicillin-clavulanate (AUGMENTIN) 875-125 MG per tablet 1 tablet  Status:  Discontinued        1 tablet Oral Every 12 hours 02/10/12 0823 02/12/12 0800   02/04/12 1800   cefOXitin (MEFOXIN) 1 g in dextrose 5 % 50 mL IVPB        1 g 100 mL/hr over 30 Minutes Intravenous Every 6 hours 02/04/12 1621 02/05/12 0617   02/04/12 0000   cefOXitin (MEFOXIN) 2 g in dextrose 5 % 50 mL IVPB        2 g 100 mL/hr over 30 Minutes Intravenous 60 min pre-op 02/03/12 0333 02/04/12 1145   02/03/12 0500    cefOXitin (MEFOXIN) 2 g in dextrose 5 % 50 mL IVPB  Status:  Discontinued        2 g 100 mL/hr over 30 Minutes Intravenous 60 min pre-op 02/02/12 1156 02/03/12 0333   02/03/12 0000   cefOXitin (MEFOXIN) 2 g in dextrose 5 % 50 mL IVPB  Status:  Discontinued        2 g 100 mL/hr over 30 Minutes Intravenous 60 min pre-op 02/02/12 1004 02/02/12 1156   01/27/12 2200   cefTRIAXone (ROCEPHIN) 1 g in dextrose 5 % 50 mL IVPB        1 g 100 mL/hr over 30 Minutes Intravenous Every 24 hours 01/27/12 2152 01/29/12 2359   01/27/12 1915   cefTRIAXone (ROCEPHIN) 1 g in dextrose 5 % 50 mL IVPB        1 g 100 mL/hr over 30 Minutes Intravenous  Once 01/27/12 1914 01/27/12 1957          Medications: I have reviewed the patient's current medications.  Objective: Vital signs in last 24 hours: Temp:  [97.7 F (36.5 C)-98.9 F (37.2 C)] 98.1 F (36.7 C) (06/08 0800)  Pulse Rate:  [89-115] 89  (06/08 0800) Resp:  [11-25] 14  (06/08 0854) BP: (104-143)/(55-98) 125/87 mmHg (06/08 0800) SpO2:  [97 %-100 %] 100 % (06/08 0854)   General appearance: alert, moderate distress and appears fatigued, uncomfortable with abdominal pain Resp: clear to auscultation bilaterally Cardio: Tachy, RR GI: + drain in place, staples c/d/i Extremities: extremities normal, atraumatic, no cyanosis or edema  Lab Results  Basename 02/29/12 0415 02/28/12 1825 02/28/12 0600  WBC 10.8* 11.6* --  HGB 8.1* 5.6* --  HCT 23.3* 16.2* --  NA 131* -- 132*  K 4.1 -- 3.8  CL 97 -- 98  CO2 30 -- 30  BUN 11 -- 10  CREATININE 0.32* -- 0.32*  GLU -- -- --   Liver Panel  Basename 02/27/12 0500  PROT 4.6*  ALBUMIN 1.8*  AST 12  ALT 11  ALKPHOS 47  BILITOT 0.4  BILIDIR --  IBILI --   Sedimentation Rate No results found for this basename: ESRSEDRATE in the last 72 hours C-Reactive Protein No results found for this basename: CRP:2 in the last 72 hours  Microbiology: Recent Results (from the past 240 hour(s))  CULTURE,  ROUTINE-ABSCESS     Status: Normal (Preliminary result)   Collection Time   02/28/12  3:52 PM      Component Value Range Status Comment   Specimen Description ABSCESS PERITONEAL CAVITY   Final    Special Requests LEFT UPPER QUADRANT ABSCESS,YELLOW PURULENT FLUID   Final    Gram Stain     Final    Value: ABUNDANT WBC PRESENT,BOTH PMN AND MONONUCLEAR     NO SQUAMOUS EPITHELIAL CELLS SEEN     ABUNDANT GRAM POSITIVE COCCI IN PAIRS     MODERATE GRAM NEGATIVE COCCOBACILLI   Culture Culture reincubated for better growth   Final    Report Status PENDING   Incomplete     Studies/Results: Ct Guided Abscess Drain  02/28/2012  *RADIOLOGY REPORT*  Clinical history:Complex medical history with multiple abdominal abscesses status post bowel surgery.  History of cervical cancer and severe thrombocytopenia.  PROCEDURE(S): CT GUIDED EXCHANGE OF LOWER ABDOMINAL ABSCESS CATHETER; PLACEMENT OF A NEW LEFT UPPER ABDOMINAL ABSCESS DRAIN WITH CT GUIDANCE  Physician: Rachelle Hora. Lowella Dandy, MD and Malachy Moan, MD  Medications:Versed 8 mg, Fentanyl 300 mcg. A radiology nurse monitored the patient for moderate sedation. The patient was given platelets before and during the procedure.  Moderate sedation time:55 minutes  Fluoroscopy time: 22 seconds CT fluoroscopy  Procedure:Informed consent was obtained for CT guided drain placements.  CT images through the abdomen were obtained.  The left lower quadrant drain was prepped and draped in a sterile fashion. The suture was removed.  The catheter was cut and a stiff Amplatz wire was advanced into the lower abdomen.  The catheter removed over wire.  A new 14-French pigtail catheter was placed over a wire and advanced into the right lower abdominal cavity.  Thick bloody fluid was aspirated.  The catheter was spun around and manipulated within the abdominal cavity.  The catheter was unable to be advanced cephalad.  Eventually, the catheter was left at the midline.  Catheter was sutured to the  skin with a Prolene suture. The catheter was attached to a gravity bag.  Attention was directed to the left upper quadrant abscess collection.  The left lateral abdomen was prepped and draped in a sterile fashion and skin was anesthetized with lidocaine.  18 gauge needle was directed into the fluid collection  posterior to the distended stomach.  Thick purulent yellow fluid was aspirated. Stiff Amplatz wire was placed.  A 12-French Renee Pain catheter was advanced into the collection and 45 ml of thick yellow fluid was removed.  Catheter was attached to a suction bulb.  Findings:The patient has complex air-fluid collection in the lower abdomen and pelvis.  The new drainage catheter was advanced further into this collection and additional thick bloody fluid was removed. Catheter was left near the midline.  A new 12-French drain was placed in the left upper abdominal fluid collection which is just posterior to the distended stomach.  Complications: None  Impression:Successful manipulation and upsizing of the left lower quadrant abdominal abscess drain.  Drain was upsized to a 14-French catheter.  Catheter was attached to a gravity bag.  Placement of a new 12-French drainage catheter within the left upper quadrant abscess.  Sample sent for culture.  Original Report Authenticated By: Richarda Overlie, M.D.   Ct Abdomen Pelvis W Contrast  02/27/2012  *RADIOLOGY REPORT*  Clinical Data: Anemia, nausea, history of cervical cancer post radiation therapy, radiation colitis, rectal bleeding, intra- abdominal/intrapelvic abscesses, drainage catheters  CT ABDOMEN AND PELVIS WITH CONTRAST  Technique:  Multidetector CT imaging of the abdomen and pelvis was performed following the standard protocol during bolus administration of intravenous contrast. Sagittal and coronal MPR images reconstructed from axial data set.  Contrast: 80mL OMNIPAQUE IOHEXOL 300 MG/ML SOLN and dilute oral contrast  Comparison: 02/19/2012  Findings: Bibasilar  atelectasis. Subcapsular collections identified at liver and the spleen question abscesses. Two subcapsular splenic collections are present, superiorly lenticular shape 10.9 x 1.4 x 2.5 cm and inferolaterally more ovoid 5.6 x 2.2 x 5.4 cm. The hepatic collection measures 4.1 x 1.4 cm at its greatest axial dimensions though this extends laterally along the liver for 13 cm length. Decreased gas within the sub capsular hepatic collection. Splenic collections appear minimally decreased in size. Few scattered small interloop collections question tiny cyst is little changed  Pigtail drainage catheters are identified at three previously seen collections in the sub hepatic space, left mid abdomen, and left pelvis with interval decrease in size of the subglottic and to the left pelvic collections. Low residual left pelvic collection measures approximately 10.2 x 3.5 cm in greatest axial dimensions. Little change in the size of an additional subtle high collection 4.9 x 1.6 cm image 48. Right lower quadrant multiloculated collection identified in aggregate measuring 4.4 x 5.2 cm greatest axial dimensions and extending 6.3 cm length by slightly decreased. Small amount gas/fluid within the anterior abdominal wound at the umbilicus. Additional left upper quadrant abscess collection is little changed, images 33 - 44. An additional loculated retroperitoneal collection has decreased in size, 5.0 x 2.1 cm image 51, previously in excess of 7.7 x 3.6 cm.  Diffuse infiltration of poorly defined tissue planes in the pelvis may be related to prior surgery, radiation therapy and tumor. Significant rectal wall thickening and infiltration of the presacral space again seen.  Right lower quadrant ileostomy. No definite free intraperitoneal air. Bowel loops appear mildly dilated proximally, without focal point of obstruction. Mild bilateral hydronephrosis and proximal ureteral dilatation. No acute osseous findings. Inhomogeneous attenuation of  the L4-L5 vertebral bodies as well as sacrum, could reflect radiation therapy change or tumor.  IMPRESSION: Multiple persistent abscess collections in the upper abdomen and pelvis, including persistent subcapsular collections at the liver and spleen. Decrease in size of the collections in the left pelvis and in the sub hepatic  space post percutaneous drainage. Persistent rectal wall thickening. Mild degree of wall of small bowel distention could reflect a partial degree of obstruction or ileus. Lower attenuation of the L4- L5 vertebral bodies as well as portions of the sacrum could be related to metastatic disease or post radiation therapy change, recommend correlation with radiation port.  Original Report Authenticated By: Lollie Marrow, M.D.   Ct Abscess Cath Exchange  02/28/2012  *RADIOLOGY REPORT*  Clinical history:Complex medical history with multiple abdominal abscesses status post bowel surgery.  History of cervical cancer and severe thrombocytopenia.  PROCEDURE(S): CT GUIDED EXCHANGE OF LOWER ABDOMINAL ABSCESS CATHETER; PLACEMENT OF A NEW LEFT UPPER ABDOMINAL ABSCESS DRAIN WITH CT GUIDANCE  Physician: Rachelle Hora. Lowella Dandy, MD and Malachy Moan, MD  Medications:Versed 8 mg, Fentanyl 300 mcg. A radiology nurse monitored the patient for moderate sedation. The patient was given platelets before and during the procedure.  Moderate sedation time:55 minutes  Fluoroscopy time: 22 seconds CT fluoroscopy  Procedure:Informed consent was obtained for CT guided drain placements.  CT images through the abdomen were obtained.  The left lower quadrant drain was prepped and draped in a sterile fashion. The suture was removed.  The catheter was cut and a stiff Amplatz wire was advanced into the lower abdomen.  The catheter removed over wire.  A new 14-French pigtail catheter was placed over a wire and advanced into the right lower abdominal cavity.  Thick bloody fluid was aspirated.  The catheter was spun around and manipulated  within the abdominal cavity.  The catheter was unable to be advanced cephalad.  Eventually, the catheter was left at the midline.  Catheter was sutured to the skin with a Prolene suture. The catheter was attached to a gravity bag.  Attention was directed to the left upper quadrant abscess collection.  The left lateral abdomen was prepped and draped in a sterile fashion and skin was anesthetized with lidocaine.  18 gauge needle was directed into the fluid collection posterior to the distended stomach.  Thick purulent yellow fluid was aspirated. Stiff Amplatz wire was placed.  A 12-French Renee Pain catheter was advanced into the collection and 45 ml of thick yellow fluid was removed.  Catheter was attached to a suction bulb.  Findings:The patient has complex air-fluid collection in the lower abdomen and pelvis.  The new drainage catheter was advanced further into this collection and additional thick bloody fluid was removed. Catheter was left near the midline.  A new 12-French drain was placed in the left upper abdominal fluid collection which is just posterior to the distended stomach.  Complications: None  Impression:Successful manipulation and upsizing of the left lower quadrant abdominal abscess drain.  Drain was upsized to a 14-French catheter.  Catheter was attached to a gravity bag.  Placement of a new 12-French drainage catheter within the left upper quadrant abscess.  Sample sent for culture.  Original Report Authenticated By: Richarda Overlie, M.D.     Assessment/Plan: Abdominal abscess - continuing to observe off of antibiotics and drain in place.  Afebrile and relatively stable.  Thrombocytopenia improved some.  Will continue to watch, if decompensates, may consider imipenem (previously on flagyl, levaquin, ceftriaxone.    Roshad Hack Infectious Diseases 02/29/2012, 9:27 AM

## 2012-03-01 DIAGNOSIS — E46 Unspecified protein-calorie malnutrition: Secondary | ICD-10-CM

## 2012-03-01 DIAGNOSIS — C7951 Secondary malignant neoplasm of bone: Secondary | ICD-10-CM

## 2012-03-01 DIAGNOSIS — K922 Gastrointestinal hemorrhage, unspecified: Secondary | ICD-10-CM

## 2012-03-01 DIAGNOSIS — D61818 Other pancytopenia: Secondary | ICD-10-CM

## 2012-03-01 LAB — TYPE AND SCREEN
ABO/RH(D): A POS
Antibody Screen: NEGATIVE
Unit division: 0
Unit division: 0
Unit division: 0

## 2012-03-01 LAB — GLUCOSE, CAPILLARY
Glucose-Capillary: 103 mg/dL — ABNORMAL HIGH (ref 70–99)
Glucose-Capillary: 110 mg/dL — ABNORMAL HIGH (ref 70–99)
Glucose-Capillary: 128 mg/dL — ABNORMAL HIGH (ref 70–99)
Glucose-Capillary: 131 mg/dL — ABNORMAL HIGH (ref 70–99)

## 2012-03-01 LAB — BASIC METABOLIC PANEL
Calcium: 9.6 mg/dL (ref 8.4–10.5)
Creatinine, Ser: 0.4 mg/dL — ABNORMAL LOW (ref 0.50–1.10)
GFR calc non Af Amer: >90 mL/min (ref >90)
Glucose, Bld: 526 mg/dL — ABNORMAL HIGH (ref 70–99)
Sodium: 130 mEq/L — ABNORMAL LOW (ref 135–145)

## 2012-03-01 LAB — BASIC METABOLIC PANEL WITH GFR
BUN: 12 mg/dL (ref 6–23)
CO2: 27 meq/L (ref 19–32)
Calcium: 10 mg/dL (ref 8.4–10.5)
Chloride: 100 meq/L (ref 96–112)
Creatinine, Ser: 0.29 mg/dL — ABNORMAL LOW (ref 0.50–1.10)
GFR calc Af Amer: >90 mL/min
GFR calc non Af Amer: >90 mL/min
Glucose, Bld: 126 mg/dL — ABNORMAL HIGH (ref 70–99)
Potassium: 4.4 meq/L (ref 3.5–5.1)
Sodium: 132 meq/L — ABNORMAL LOW (ref 135–145)

## 2012-03-01 LAB — CBC
MCH: 31.4 pg (ref 26.0–34.0)
Platelets: 56 10*3/uL — ABNORMAL LOW (ref 150–400)
RBC: 2.61 MIL/uL — ABNORMAL LOW (ref 3.87–5.11)
WBC: 10.9 10*3/uL — ABNORMAL HIGH (ref 4.0–10.5)

## 2012-03-01 LAB — PHOSPHORUS: Phosphorus: 3 mg/dL (ref 2.3–4.6)

## 2012-03-01 LAB — PROCALCITONIN: Procalcitonin: <0.1 ng/mL

## 2012-03-01 LAB — MAGNESIUM: Magnesium: 1.7 mg/dL (ref 1.5–2.5)

## 2012-03-01 MED ORDER — SODIUM CHLORIDE 0.9 % IV SOLN
Freq: Four times a day (QID) | INTRAVENOUS | Status: DC
  Administered 2012-03-01 – 2012-03-02 (×4): via INTRAVENOUS
  Filled 2012-03-01 (×7): qty 50

## 2012-03-01 MED ORDER — MAGNESIUM SULFATE 40 MG/ML IJ SOLN
2.0000 g | Freq: Once | INTRAMUSCULAR | Status: AC
  Administered 2012-03-01: 2 g via INTRAVENOUS
  Filled 2012-03-01: qty 50

## 2012-03-01 MED ORDER — CLINIMIX E/DEXTROSE (5/20) 5 % IV SOLN
INTRAVENOUS | Status: AC
  Administered 2012-03-01: 17:00:00 via INTRAVENOUS
  Filled 2012-03-01: qty 2000

## 2012-03-01 MED ORDER — SODIUM CHLORIDE 0.9 % IV SOLN
INTRAVENOUS | Status: DC
  Administered 2012-03-01: 1000 mL via INTRAVENOUS

## 2012-03-01 NOTE — Progress Notes (Signed)
PARENTERAL NUTRITION CONSULT NOTE - FOLLOW UP  Pharmacy Consult for TPN Indication: intraabdominal abscess, ileus  Allergies  Allergen Reactions  . Labetalol Hcl Itching and Other (See Comments)    Bumps to bilateral arms.  . Morphine And Related Hives   Patient Measurements: Height: 5\' 4"  (162.6 cm) Weight: 164 lb 3.2 oz (74.481 kg) IBW/kg (Calculated) : 54.7   Vital Signs: Temp: 98.4 F (36.9 C) (06/09 0400) Temp src: Oral (06/09 0400) BP: 141/98 mmHg (06/09 0700) Pulse Rate: 98  (06/09 0700) Intake/Output from previous day: 06/08 0701 - 06/09 0700 In: 4831.9 [P.O.:100; I.V.:2376.9; Blood:305; TPN:1680] Out: 5910 [Urine:2275; Emesis/NG output:3500; Drains:85; Stool:50] Intake/Output from this shift:  Labs:  Basename 03/01/12 0442 02/29/12 1800 02/29/12 0415 02/28/12 0600  WBC 10.9* 11.2* 10.8* --  HGB 8.2* 6.7* 8.1* --  HCT 24.3* 20.4* 23.3* --  PLT 56* 38* 17* --  APTT -- -- -- 32  INR -- -- -- 1.33    Basename 03/01/12 0442 02/29/12 0415 02/28/12 0600  NA 132* 131* 132*  K 4.4 4.1 3.8  CL 100 97 98  CO2 27 30 30   GLUCOSE 126* 115* 125*  BUN 12 11 10   CREATININE 0.29* 0.32* 0.32*  LABCREA -- -- --  CREAT24HRUR -- -- --  CALCIUM 10.0 9.8 9.5  MG 1.7 -- 1.6  PHOS 3.0 -- 3.0  PROT -- -- --  ALBUMIN -- -- --  AST -- -- --  ALT -- -- --  ALKPHOS -- -- --  BILITOT -- -- --  BILIDIR -- -- --  IBILI -- -- --  PREALBUMIN -- -- --  TRIG -- -- --  CHOLHDL -- -- --  CHOL -- -- --   Estimated Creatinine Clearance: 96.1 ml/min (by C-G formula based on Cr of 0.29).   Basename 03/01/12 0415 02/29/12 2358 02/29/12 1944  GLUCAP 128* 131* 115*   Medications:  Scheduled:     . alteplase  2 mg Intracatheter Once  . aminocaproic acid  5-15 mL Oral TID  . aminocaproic acid  1 g Oral Q4H  . barrier cream  1 application Topical TID  . dexamethasone  4 mg Intravenous Q12H  . feeding supplement  1 Container Oral BID WC  . folic acid  1 mg Intravenous Daily  .  HYDROmorphone PCA 0.3 mg/mL   Intravenous Q4H  . IMMUNE GLOBULIN 10% (HUMAN) IV - For Fluid Restriction Only  400 mg/kg Intravenous Q24H  . insulin aspart  0-9 Units Subcutaneous Q4H  . metoCLOPramide (REGLAN) injection  5 mg Intravenous Q6H  . multivitamin with minerals  1 tablet Oral Daily  . oxymetazoline  2 spray Each Nare BID  . prochlorperazine  5 mg Intravenous QID  . romiPLOStim  6 mcg/kg Subcutaneous Once  . sodium chloride  10-40 mL Intracatheter Q12H   Infusions:     . sodium chloride Stopped (03/01/12 0300)  . 0.9 % sodium chloride with kcl 80 mL/hr at 03/01/12 0609  . TPN (CLINIMIX) +/- additives 70 mL/hr at 02/28/12 1742  . TPN (CLINIMIX) +/- additives 70 mL/hr at 02/29/12 2000  . DISCONTD: 0.9 % sodium chloride with kcl 80 mL/hr at 02/29/12 0911    Insulin Requirements in the past 24 hours:  2 units SSI with NO insulin in TNA with q4h sensitive SSI  Current Nutrition:  - Clinimix E5/20 at 70 ml/hr (at goal) - Unable to provide lipids due to possible contribution to thrombocytopenia.   - Resource Breeze PO BID added  6/5 to maximize oral intake **Note: TPN stopped 6/6 ~9 hours early per MD orders d/t inclusion of famotidine.  Future TPN bags will not include famotidine d/t risk of thrombocytopenia (<1%).  D10 @70ml /hr started until 1800. **   Nutritional Goals:  1800-2000 kCal, 80-95 grams of protein per day.    Clinimix E5/20 goal rate of 21ml/hr will provide an average of 1478 kcal and 84g protein per day based on RD assessment (82% total kcal and 100% protein needs)  Assessment: 41 yof with a history of cervical cancer who is s/p sigmoid colectomy and diverting ileostomy on 5/14. Developed multiple intra-abdominal abscesses 2/2 anastomotic leak shown on CT and had 3 perc drains placed. On 5/29 patient had multiple blood BM, hemoptysis, and abdominal pain. Plts dropped to <5 (240k on 5/25) but have now improved to 56. CT 5/30 showed additional abscesses and concern  for fistula formation.   Anticoag:  Persistent thrombocytopenia but starting to improve (plts 56) and anemia (hgb 8.2) with rectal bleeding and epistaxis (bleeding around her nasal packing has decreased). ID suspects plt drop from beta-lactams, other possibilities include consumption vs antibody induced drop. No evidence of TTP,HITT, or DIC.  Indirect platelet IgG negative. HLA typing likely not available until 6/7.  S/p platelets (5/29: 1 unit, 5/30: 3 units, 5/31: 4 units, 6/1: 1 unit, 6/4: 2units), PRBC (5/29:2 units, 5/31: 2 units, 6/1: 2 units, 6/2: 3 units, 6/3: 2 units, 6/4: 3 units, 6/5: 2 units, 6/6: 2 units), FFP (5/30: 2 units, 6/1: 2 units), and tranexamic acid (6/3: 10mg /kg IV load, then 2mg /kg/hr x 2 hours). S/p IVIG and NPlate 6/8.  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 Anastomotic breakdown leading to many intra-abdominal abscesses and concern for fistula on recent CT 5/30. Perc drains in place with 49ml/24hr drainage. Pt may need more drains placed but unable to take to surgery with low plt count (possibly going to surgery next week.. Pt with significant amount of hemoptysis/hematochezia. Unknown GI absorption. CT scan abdomen and pelvis 6/6 - Continues to show persistent abscess collections in the upper abd/pelvis. Started on scheduled reglan. Endo: No hx DM. CBG adequately controlled; Steroids d/c'd 6/5.  Isolated BG 586 contaminated from TPN line - redraw CBG normal@ 129.  Requiring minimal insulin, may want to DC SSI.  Lytes: 1st BMET 6/5 drawn from TPN line and contaminated. Lytes today WNL except Na is 132 and Mg is slightly below goal at 1.7. Continues on NS w/62mEqK @80ml /hr.  CorrCa elevated at 12.56 (Ca x Phos <55) Renal: SCr good, UOP 1.3 ml/kg/hr.  I/O: +1 (received several blood product infusions). Prealbumin rising.  Pulm: RA Cards: BP ok, some tachycardia with prn hydralazine (allergy to BB, HCTZ d/c'd d/t possible contribution to thrombocytopenia) Hepatobil:  Alk phos/LFTs/bili wnl from labs on 6/6. Albumin remains low.  Triglycerides slightly increased, will follow.  Cholesterol noted. Neuro: standing po morphine BID and dilaudid PCA  Heme/Onc: Hx of cervical cancer, plts 5/25 at 240 then dropped to <5 but now up to 56 after many PRBC, plts, TXA, IVIG and Nplate.  Heme/onc consulted - unclear cause, suspect consumption. Unclear if/when OR procedure possible -plan to wait in hopes of plts rising >50K (possibly sometime next week). Nplate weekly (6/1, 6/8) and PO amicar started 6/4.     ID: Pt with known peritoneal infection now off all Abx (CCM stopped all medications that could induce thrombocytopenia); WBC remains elevated, stable; afebrile; s/p IVIG x 2 doses - 6/1, 6/2.  ID  thinks thrombocytopenia may be d/t beta-lactams given through 6/3. May restart abx (recommended primaxin) soon if not platelet improvement and patient decompensation.  Rocephin 5/5 >> 5/8; 5/30>>6/3 Cefoxitin 5/14 >> 5/15 Augmentin 5/20 >> 5/21 Zosyn 5/22 >> 5/29 Cipro 5/29 >>5/30 Flagyl 5/29 >>6/5 Levofloxacin 6/3 >>6/4  Micro: 5/23 RUQ Abscess: rare viridans strep; B. Fragilis (no sens) 5/23 LLQ Abscess: few E.coli (pan sens) 5/23 LUQ Abscess: multiple bacteria 5/14 MRSA PCR neg 5/6 Urine: neg  Best Practices: SCDs  Plan:  - Continue Clinimix E 5/20 at goal of 70 ml/hr  - Magnesium 2gm IV x 1 (was requiring higher doses, may need further supplementation tomorrow) - No famotidine in TNA (<1% chance of thrombocytopenia) - TE MWF only 2/2 Sport and exercise psychologist.  Fat emulsion omitted d/t to chance of worsening thrombocytopenia. Will f/u ability to restart fats as appropriate. - Continue PO MVI 2/2 national shortage of IV MVI - f/u AM labs  [name], PharmD, BCPS Pager # [phone] 03/01/2012 7:30 AM

## 2012-03-01 NOTE — Progress Notes (Signed)
Name: Vanessa [name] MRN: [id number] DOB: 30-Aug-1976    LOS: 34  Referring Provider:  [facility]S  Reason for Referral:  Thrombocytopenia    PULMONARY / CRITICAL CARE MEDICINE  Brief patient description:  36 y/o F with Hx of cervical cancer s/p chemo / radiation (2012), admitted 5/6 with abd pain, n/v now with abdominal adhesions / abscess and profound thrombocytopenia -not responsive to infusion.  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  Fixed sigmoid with sharp angulation 5/14 Colon resection with sigmoid / diverting loop ileostomy 5/23 Perc Drain by IR of abd abscesses 5/31 PICC line placed Received PRBC 6/2, 6/3, 6/4, 6/5, 6/6, 6/7 ~3000cc BRBPR 6/5  Subjective/Interval events:  Cont to have large volume emesis.  Now with NG tube.  Plt cont trend up.   Vital Signs: Temp:  [98.3 F (36.8 C)-98.8 F (37.1 C)] 98.3 F (36.8 C) (06/09 0820) Pulse Rate:  [89-125] 101  (06/09 1100) Resp:  [13-24] 16  (06/09 1226) BP: (120-152)/(78-105) 120/83 mmHg (06/09 1100) SpO2:  [97 %-100 %] 99 % (06/09 1226)   Intake/Output Summary (Last 24 hours) at 03/01/12 1231 Last data filed at 03/01/12 1100  Gross per 24 hour  Intake 4171.87 ml  Output   4110 ml  Net  61.87 ml    Physical Examination: General:  Chronically ill, appears older than age Neuro: drowsy post phenergan, arousable, follows commands Cardiovascular:  s1s2 rrr, no m/r/g Lungs:  resp's even/non-labored, few scattered ronchi R>L Abdomen:  Round / soft, tender,  4 JP drains, RLQ ileostomy, NG tube with large volume emesis  Musculoskeletal:  No obvious deformities Skin:  Warm/dry  Principal Problem:  *Sigmoid stricture Active Problems:  Cervical cancer  Post-radiation rectal bleeding  Pyelonephritis  Nausea & vomiting  Hypercalcemia  Radiation colitis  Hemorrhage of rectum and anus  Acute posthemorrhagic anemia   Thrombocytopenia   ASSESSMENT AND PLAN  PULMONARY  CXR:  none ETT:  none  A:   No acute issues P:   -supportive care, monitor respiratory status   CARDIOVASCULAR  ECG:   Lines:  RUE PICC 5/31>>>  A:  Hypertension- improved P:  -she is allergic to beta blockers, PRN hydralazine  -ICU monitoring  RENAL  Lab 03/01/12 0442 02/29/12 0415 02/28/12 0600 02/27/12 0500 02/26/12 0524 02/26/12 0402 02/25/12 0444  NA 132* 131* 132* 132* 130* -- --  K 4.4 4.1 -- -- -- -- --  CL 100 97 98 100 98 -- --  CO2 27 30 30 27 27  -- --  BUN 12 11 10 14 14  -- --  CREATININE 0.29* 0.32* 0.32* 0.36* 0.35* -- --  CALCIUM 10.0 9.8 9.5 9.7 10.5 -- --  MG 1.7 -- 1.6 1.4* -- 1.5 1.5  PHOS 3.0 -- 3.0 2.2* -- 3.2 2.2*   Intake/Output      06/08 0701 - 06/09 0700 06/09 0701 - 06/10 0700   P.O. 100    I.V. (mL/kg) 2376.9 (31.9) 310 (4.2)   Blood 305    Other 370    IV Piggyback  50   TPN 1680 280   Total Intake(mL/kg) 4831.9 (64.9) 640 (8.6)   Urine (mL/kg/hr) 2275 (1.3) 200 (0.5)   Emesis/NG output 3500    Drains 85    Stool 50 200   Total Output 5910 400   Net -1078.1 +240         Foley:    A:   Hyponatremia  Hypophosphatemia  P:   -TNA per Pharmacy >> fats and pepcid removed to avoid thrombocytopenia -f/u chem    GASTROINTESTINAL  Lab 02/27/12 0500 02/24/12 0500  AST 12 16  ALT 11 13  ALKPHOS 47 51  BILITOT 0.4 0.9  PROT 4.6* 7.3  ALBUMIN 1.8* 2.0*   CT ABD/Pelvis 6/7>>>Multiple persistent abscess collections in the upper abdomen and pelvis, including persistent subcapsular collections at the liver  and spleen. Persistent rectal wall thickening.  Mild degree of wall of small bowel distention could reflect a  partial degree of obstruction or ileus.   A:   Abdominal Pain. S/P sigmoid colectomy with primary anastomosis and diverting ileostomy. Probable anastomotic leak, multiple inta abd abscesses.  S/p new drain placement in IR 6/8 now with total 4 drains.   P: -pain control  -Recs per [facility]CS - consider repeat CT scan abd/pelvis although oral  Contrast likely not good idea  -WOC for ileostomy  A: ? Ileus/nausea/vomiting P: -now with NG tube, 3L out thus far today -Zofran, phenergan PRN  -Reglan added 6/8 -Keep NPO -if plt cont to trend up may be candidate for surgery next week per dr wyatt     Rectal bleeding- continues to have large amt of BRB mixed with large clots  P:   -continue to monitor -f/u CBC -transfuse PRBC as needed  HEMATOLOGIC  Lab 03/01/12 0442 02/29/12 1800 02/29/12 0415 02/28/12 1825 02/28/12 0600 02/26/12 0400  HGB 8.2* 6.7* 8.1* 5.6* 7.0* --  HCT 24.3* 20.4* 23.3* 16.2* 20.2* --  PLT 56* 38* 17* 10* <5* --  INR -- -- -- -- 1.33 1.39  APTT -- -- -- -- 32 28   Hematology panel 6/1: no schitocytes, haptoglogin 154, fibrinogen 410  A:   Refractory Thrombocytopenia, suspect d/t consumption vs. Drug related (? Cephlosporin).  Cannot r/o antibody induced drop. S/p chemo / radiation for cervical cancer. Last treatment in 10/2010. Appreciate Dr Precious Reel input - no evidence for TTP, HITT, or DIC. 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 Suspect due to meds, but all possible culprits have been stopped. Has failed Amicar, IVIg, and steroid treatment.   **plt trending up 6/9.  Off abx x 4 days ? abx as culprit.   P: -f/u hematology recs -transfuse plts per hematology -Amicar dosing per H/O -f/u cbc  -leave off abx for now   Anemia -secondary to bleeding from nose and rectum, 2 u PRBCs 6/3, 6/6, 6/8.  Cont to have ?worsenig rectal bleeding.  P: -Nasal packing per ENT - think this bleeding has stopped  -f/u CBC -transfuse for HGB <7 -limit lab draws -Caution for toxic shock from nasal packing  INFECTIOUS  Lab 03/01/12 0442 02/29/12 1800 02/29/12 0415 02/28/12 1825 02/28/12 0600  WBC 10.9* 11.2* 10.8* 11.6* 12.6*  PROCALCITON <0.10 -- -- -- --   Cultures: UC 5/6>>>neg 5/13 MRSA  PCR>>>neg 5/23 Abd abscess drain>>>multiple organisims 5/23 Abd drainage>>>few e-Coli 5/23 RU abd drain>>>rare viridans, abundant bacteroides fragilis, beta lactam positive 6/2 HIV>>> neg  Antibiotics: Augmentin 5/20>>5/22 Cefoxitin 5/13>>>5/15 Rocephin 5/6>>>5/8, 5/30>>>6/3 Cipro 5/29>>>5/30 Flagyl 5/29>>>6/5 Zosyn 5/22>>>5/29 Levaquin 6/3>>>6/5  A:   Abdominal Abscess - IR drains in place P:   -All ABX d/c'd 6/5  ??they are the culprit for persistant thrombocytopenia with possible rebound of plt 6/8 so will cont to hold abx for now  -follow closely for signs evolving infection -note at high risk toxic shock syndrome with nasal packing and no abx -per CCS when  to go to OR -if decompensation then restart abx as above   ENDOCRINE  Lab 03/01/12 0826 03/01/12 0415 02/29/12 2358 02/29/12 1944 02/29/12 1615  GLUCAP 124* 128* 131* 115* 118*   A:   Hyperglycemia- well controled  P:   -monitor CBG glucose -Continue SSI  NEUROLOGIC  A:  Anxiety  P:   -supportive care -PRN ativan   BEST PRACTICE / DISPOSITION Level of Care:  ICU Primary Service:  CCS Consultants:  PCCM, Hematology, GI Code Status:  Full Diet:  Clear DVT Px:  scd's GI Px:  pepcid Skin Integrity:  intact Social / Family: updates family at bedside 6/8  [facility], NP 03/01/2012  12:31 PM Pager: [phone]  *Care during the described time interval was provided by me and/or other providers on the critical care team. I have reviewed this patient's available data, including medical history, events of note, physical examination and test results as part of my evaluation.  Platelets are slowly rising, once platelets > 80 then patient will be a surgical candidate.  Surgery following, will defer to surgery.  Will keep in the ICU until patient undergoes surgery since that seems to be quickly approaching.  Patient seen and examined, agree with above note.  I dictated the care and orders written for  this patient under my direction.  Koren Bound, M.D. [phone]

## 2012-03-01 NOTE — Progress Notes (Signed)
26 Days Post-Op  Subjective: Cervical ca; multiple abd absesses post surgery Abscess drains x 4 in place +N/+V Getting NG today- plts up   Objective: Vital signs in last 24 hours: Temp:  [98.1 F (36.7 C)-98.8 F (37.1 C)] 98.4 F (36.9 C) (06/09 0400) Pulse Rate:  [89-112] 98  (06/09 0700) Resp:  [13-24] 15  (06/09 0700) BP: (124-152)/(78-105) 141/98 mmHg (06/09 0700) SpO2:  [97 %-100 %] 100 % (06/09 0700) Last BM Date: 02/29/12  Intake/Output from previous day: 06/08 0701 - 06/09 0700 In: 4831.9 [P.O.:100; I.V.:2376.9; Blood:305; TPN:1680] Out: 5910 [Urine:2275; Emesis/NG output:3500; Drains:85; Stool:50] Intake/Output this shift:    PE:  Afeb; vss Not well; still having n/v All drains intact All output bloody with exception of LUQ #2 - cloudy yellow LLQ draining most; 40 cc yesterday.... Nurse says actually more because some output is not going into bag, but leaking along crease of leg foul smell to drainage of all drains Wbc: 10.9  Lab Results:   Basename 03/01/12 0442 02/29/12 1800  WBC 10.9* 11.2*  HGB 8.2* 6.7*  HCT 24.3* 20.4*  PLT 56* 38*   BMET  Basename 03/01/12 0442 02/29/12 0415  NA 132* 131*  K 4.4 4.1  CL 100 97  CO2 27 30  GLUCOSE 126* 115*  BUN 12 11  CREATININE 0.29* 0.32*  CALCIUM 10.0 9.8   PT/INR  Basename 02/28/12 0600  LABPROT 16.7*  INR 1.33   ABG No results found for this basename: PHART:2,PCO2:2,PO2:2,HCO3:2 in the last 72 hours  Studies/Results: Ct Guided Abscess Drain  02/28/2012  *RADIOLOGY REPORT*  Clinical history:Complex medical history with multiple abdominal abscesses status post bowel surgery.  History of cervical cancer and severe thrombocytopenia.  PROCEDURE(S): CT GUIDED EXCHANGE OF LOWER ABDOMINAL ABSCESS CATHETER; PLACEMENT OF A NEW LEFT UPPER ABDOMINAL ABSCESS DRAIN WITH CT GUIDANCE  Physician: Rachelle Hora. Lowella Dandy, MD and Malachy Moan, MD  Medications:Versed 8 mg, Fentanyl 300 mcg. A radiology nurse monitored the  patient for moderate sedation. The patient was given platelets before and during the procedure.  Moderate sedation time:55 minutes  Fluoroscopy time: 22 seconds CT fluoroscopy  Procedure:Informed consent was obtained for CT guided drain placements.  CT images through the abdomen were obtained.  The left lower quadrant drain was prepped and draped in a sterile fashion. The suture was removed.  The catheter was cut and a stiff Amplatz wire was advanced into the lower abdomen.  The catheter removed over wire.  A new 14-French pigtail catheter was placed over a wire and advanced into the right lower abdominal cavity.  Thick bloody fluid was aspirated.  The catheter was spun around and manipulated within the abdominal cavity.  The catheter was unable to be advanced cephalad.  Eventually, the catheter was left at the midline.  Catheter was sutured to the skin with a Prolene suture. The catheter was attached to a gravity bag.  Attention was directed to the left upper quadrant abscess collection.  The left lateral abdomen was prepped and draped in a sterile fashion and skin was anesthetized with lidocaine.  18 gauge needle was directed into the fluid collection posterior to the distended stomach.  Thick purulent yellow fluid was aspirated. Stiff Amplatz wire was placed.  A 12-French Renee Pain catheter was advanced into the collection and 45 ml of thick yellow fluid was removed.  Catheter was attached to a suction bulb.  Findings:The patient has complex air-fluid collection in the lower abdomen and pelvis.  The new drainage catheter  was advanced further into this collection and additional thick bloody fluid was removed. Catheter was left near the midline.  A new 12-French drain was placed in the left upper abdominal fluid collection which is just posterior to the distended stomach.  Complications: None  Impression:Successful manipulation and upsizing of the left lower quadrant abdominal abscess drain.  Drain was upsized  to a 14-French catheter.  Catheter was attached to a gravity bag.  Placement of a new 12-French drainage catheter within the left upper quadrant abscess.  Sample sent for culture.  Original Report Authenticated By: Richarda Overlie, M.D.   Ct Abscess Cath Exchange  02/28/2012  *RADIOLOGY REPORT*  Clinical history:Complex medical history with multiple abdominal abscesses status post bowel surgery.  History of cervical cancer and severe thrombocytopenia.  PROCEDURE(S): CT GUIDED EXCHANGE OF LOWER ABDOMINAL ABSCESS CATHETER; PLACEMENT OF A NEW LEFT UPPER ABDOMINAL ABSCESS DRAIN WITH CT GUIDANCE  Physician: Rachelle Hora. Lowella Dandy, MD and Malachy Moan, MD  Medications:Versed 8 mg, Fentanyl 300 mcg. A radiology nurse monitored the patient for moderate sedation. The patient was given platelets before and during the procedure.  Moderate sedation time:55 minutes  Fluoroscopy time: 22 seconds CT fluoroscopy  Procedure:Informed consent was obtained for CT guided drain placements.  CT images through the abdomen were obtained.  The left lower quadrant drain was prepped and draped in a sterile fashion. The suture was removed.  The catheter was cut and a stiff Amplatz wire was advanced into the lower abdomen.  The catheter removed over wire.  A new 14-French pigtail catheter was placed over a wire and advanced into the right lower abdominal cavity.  Thick bloody fluid was aspirated.  The catheter was spun around and manipulated within the abdominal cavity.  The catheter was unable to be advanced cephalad.  Eventually, the catheter was left at the midline.  Catheter was sutured to the skin with a Prolene suture. The catheter was attached to a gravity bag.  Attention was directed to the left upper quadrant abscess collection.  The left lateral abdomen was prepped and draped in a sterile fashion and skin was anesthetized with lidocaine.  18 gauge needle was directed into the fluid collection posterior to the distended stomach.  Thick purulent  yellow fluid was aspirated. Stiff Amplatz wire was placed.  A 12-French Renee Pain catheter was advanced into the collection and 45 ml of thick yellow fluid was removed.  Catheter was attached to a suction bulb.  Findings:The patient has complex air-fluid collection in the lower abdomen and pelvis.  The new drainage catheter was advanced further into this collection and additional thick bloody fluid was removed. Catheter was left near the midline.  A new 12-French drain was placed in the left upper abdominal fluid collection which is just posterior to the distended stomach.  Complications: None  Impression:Successful manipulation and upsizing of the left lower quadrant abdominal abscess drain.  Drain was upsized to a 14-French catheter.  Catheter was attached to a gravity bag.  Placement of a new 12-French drainage catheter within the left upper quadrant abscess.  Sample sent for culture.  Original Report Authenticated By: Richarda Overlie, M.D.    Anti-infectives:   Assessment/Plan: s/p Procedure(s) (LRB): COLON RESECTION SIGMOID (N/A)  Abd drains intact (all 4) Draining well Wbc down; afeb N/V; getting NG today Plan per MD  [facility] 03/01/2012

## 2012-03-01 NOTE — Progress Notes (Signed)
INFECTIOUS DISEASE PROGRESS NOTE  ID: Vanessa [name] is a 36 y.o. female with a history of cervical cancer s/p XRT and presented 5/7 with N/V and underwent colectomy and diverting ileostomy due to abnormal CT scan.  Had abscess collections noted on CT and has had drains placed.  Developed significant thrombocytopenia and antibiotics have been held to rule out antibiotic induced.    Subjective: Continued nausea, abdominal pain  Abtx:  Anti-infectives     Start     Dose/Rate Route Frequency Ordered Stop   02/24/12 1600   levofloxacin (LEVAQUIN) IVPB 750 mg  Status:  Discontinued        750 mg 100 mL/hr over 90 Minutes Intravenous Every 24 hours 02/24/12 1526 02/26/12 1012   02/20/12 1415   cefTRIAXone (ROCEPHIN) 1 g in dextrose 5 % 50 mL IVPB  Status:  Discontinued        1 g 100 mL/hr over 30 Minutes Intravenous Every 24 hours 02/20/12 1404 02/24/12 1526   02/19/12 1800   ciprofloxacin (CIPRO) IVPB 400 mg  Status:  Discontinued        400 mg 200 mL/hr over 60 Minutes Intravenous Every 12 hours 02/19/12 1612 02/20/12 1404   02/19/12 1700   metroNIDAZOLE (FLAGYL) IVPB 500 mg  Status:  Discontinued        500 mg 100 mL/hr over 60 Minutes Intravenous 3 times per day 02/19/12 1612 02/26/12 1012   02/12/12 0800   piperacillin-tazobactam (ZOSYN) IVPB 3.375 g  Status:  Discontinued        3.375 g 12.5 mL/hr over 240 Minutes Intravenous 3 times per day 02/12/12 0800 02/19/12 1612   02/10/12 1000   amoxicillin-clavulanate (AUGMENTIN) 875-125 MG per tablet 1 tablet  Status:  Discontinued        1 tablet Oral Every 12 hours 02/10/12 0823 02/12/12 0800   02/04/12 1800   cefOXitin (MEFOXIN) 1 g in dextrose 5 % 50 mL IVPB        1 g 100 mL/hr over 30 Minutes Intravenous Every 6 hours 02/04/12 1621 02/05/12 0617   02/04/12 0000   cefOXitin (MEFOXIN) 2 g in dextrose 5 % 50 mL IVPB        2 g 100 mL/hr over 30 Minutes Intravenous 60 min pre-op 02/03/12 0333 02/04/12 1145   02/03/12 0500    cefOXitin (MEFOXIN) 2 g in dextrose 5 % 50 mL IVPB  Status:  Discontinued        2 g 100 mL/hr over 30 Minutes Intravenous 60 min pre-op 02/02/12 1156 02/03/12 0333   02/03/12 0000   cefOXitin (MEFOXIN) 2 g in dextrose 5 % 50 mL IVPB  Status:  Discontinued        2 g 100 mL/hr over 30 Minutes Intravenous 60 min pre-op 02/02/12 1004 02/02/12 1156   01/27/12 2200   cefTRIAXone (ROCEPHIN) 1 g in dextrose 5 % 50 mL IVPB        1 g 100 mL/hr over 30 Minutes Intravenous Every 24 hours 01/27/12 2152 01/29/12 2359   01/27/12 1915   cefTRIAXone (ROCEPHIN) 1 g in dextrose 5 % 50 mL IVPB        1 g 100 mL/hr over 30 Minutes Intravenous  Once 01/27/12 1914 01/27/12 1957          Medications: I have reviewed the patient's current medications.  Objective: Vital signs in last 24 hours: Temp:  [98.3 F (36.8 C)-98.8 F (37.1 C)] 98.3 F (36.8 C) (06/09 0820)  Pulse Rate:  [89-125] 101  (06/09 1100) Resp:  [13-24] 13  (06/09 1100) BP: (120-152)/(78-105) 120/83 mmHg (06/09 1100) SpO2:  [97 %-100 %] 100 % (06/09 1100)   General appearance: alert, moderate distress and appears fatigued, uncomfortable with abdominal pain Resp: clear to auscultation bilaterally Cardio: Tachy, RR GI: + drain in place, staples c/d/i Extremities: extremities normal, atraumatic, no cyanosis or edema  Lab Results  Basename 03/01/12 0442 02/29/12 1800 02/29/12 0415  WBC 10.9* 11.2* --  HGB 8.2* 6.7* --  HCT 24.3* 20.4* --  NA 132* -- 131*  K 4.4 -- 4.1  CL 100 -- 97  CO2 27 -- 30  BUN 12 -- 11  CREATININE 0.29* -- 0.32*  GLU -- -- --   Liver Panel No results found for this basename: PROT:2,ALBUMIN:2,AST:2,ALT:2,ALKPHOS:2,BILITOT:2,BILIDIR:2,IBILI:2 in the last 72 hours Sedimentation Rate No results found for this basename: ESRSEDRATE in the last 72 hours C-Reactive Protein No results found for this basename: CRP:2 in the last 72 hours  Microbiology: Recent Results (from the past 240 hour(s))    CULTURE, ROUTINE-ABSCESS     Status: Normal (Preliminary result)   Collection Time   02/28/12  3:52 PM      Component Value Range Status Comment   Specimen Description ABSCESS PERITONEAL CAVITY   Final    Special Requests LEFT UPPER QUADRANT ABSCESS,YELLOW PURULENT FLUID   Final    Gram Stain     Final    Value: ABUNDANT WBC PRESENT,BOTH PMN AND MONONUCLEAR     NO SQUAMOUS EPITHELIAL CELLS SEEN     ABUNDANT GRAM POSITIVE COCCI IN PAIRS     MODERATE GRAM NEGATIVE COCCOBACILLI   Culture     Final    Value: MODERATE GRAM NEGATIVE RODS     ABUNDANT VIRIDANS STREPTOCOCCUS   Report Status PENDING   Incomplete     Studies/Results: Ct Guided Abscess Drain  02/28/2012  *RADIOLOGY REPORT*  Clinical history:Complex medical history with multiple abdominal abscesses status post bowel surgery.  History of cervical cancer and severe thrombocytopenia.  PROCEDURE(S): CT GUIDED EXCHANGE OF LOWER ABDOMINAL ABSCESS CATHETER; PLACEMENT OF A NEW LEFT UPPER ABDOMINAL ABSCESS DRAIN WITH CT GUIDANCE  Physician: Rachelle Hora. Lowella Dandy, MD and Malachy Moan, MD  Medications:Versed 8 mg, Fentanyl 300 mcg. A radiology nurse monitored the patient for moderate sedation. The patient was given platelets before and during the procedure.  Moderate sedation time:55 minutes  Fluoroscopy time: 22 seconds CT fluoroscopy  Procedure:Informed consent was obtained for CT guided drain placements.  CT images through the abdomen were obtained.  The left lower quadrant drain was prepped and draped in a sterile fashion. The suture was removed.  The catheter was cut and a stiff Amplatz wire was advanced into the lower abdomen.  The catheter removed over wire.  A new 14-French pigtail catheter was placed over a wire and advanced into the right lower abdominal cavity.  Thick bloody fluid was aspirated.  The catheter was spun around and manipulated within the abdominal cavity.  The catheter was unable to be advanced cephalad.  Eventually, the catheter was  left at the midline.  Catheter was sutured to the skin with a Prolene suture. The catheter was attached to a gravity bag.  Attention was directed to the left upper quadrant abscess collection.  The left lateral abdomen was prepped and draped in a sterile fashion and skin was anesthetized with lidocaine.  18 gauge needle was directed into the fluid collection posterior to the distended stomach.  Thick purulent yellow fluid was aspirated. Stiff Amplatz wire was placed.  A 12-French Renee Pain catheter was advanced into the collection and 45 ml of thick yellow fluid was removed.  Catheter was attached to a suction bulb.  Findings:The patient has complex air-fluid collection in the lower abdomen and pelvis.  The new drainage catheter was advanced further into this collection and additional thick bloody fluid was removed. Catheter was left near the midline.  A new 12-French drain was placed in the left upper abdominal fluid collection which is just posterior to the distended stomach.  Complications: None  Impression:Successful manipulation and upsizing of the left lower quadrant abdominal abscess drain.  Drain was upsized to a 14-French catheter.  Catheter was attached to a gravity bag.  Placement of a new 12-French drainage catheter within the left upper quadrant abscess.  Sample sent for culture.  Original Report Authenticated By: Richarda Overlie, M.D.   Ct Abscess Cath Exchange  02/28/2012  *RADIOLOGY REPORT*  Clinical history:Complex medical history with multiple abdominal abscesses status post bowel surgery.  History of cervical cancer and severe thrombocytopenia.  PROCEDURE(S): CT GUIDED EXCHANGE OF LOWER ABDOMINAL ABSCESS CATHETER; PLACEMENT OF A NEW LEFT UPPER ABDOMINAL ABSCESS DRAIN WITH CT GUIDANCE  Physician: Rachelle Hora. Lowella Dandy, MD and Malachy Moan, MD  Medications:Versed 8 mg, Fentanyl 300 mcg. A radiology nurse monitored the patient for moderate sedation. The patient was given platelets before and during the  procedure.  Moderate sedation time:55 minutes  Fluoroscopy time: 22 seconds CT fluoroscopy  Procedure:Informed consent was obtained for CT guided drain placements.  CT images through the abdomen were obtained.  The left lower quadrant drain was prepped and draped in a sterile fashion. The suture was removed.  The catheter was cut and a stiff Amplatz wire was advanced into the lower abdomen.  The catheter removed over wire.  A new 14-French pigtail catheter was placed over a wire and advanced into the right lower abdominal cavity.  Thick bloody fluid was aspirated.  The catheter was spun around and manipulated within the abdominal cavity.  The catheter was unable to be advanced cephalad.  Eventually, the catheter was left at the midline.  Catheter was sutured to the skin with a Prolene suture. The catheter was attached to a gravity bag.  Attention was directed to the left upper quadrant abscess collection.  The left lateral abdomen was prepped and draped in a sterile fashion and skin was anesthetized with lidocaine.  18 gauge needle was directed into the fluid collection posterior to the distended stomach.  Thick purulent yellow fluid was aspirated. Stiff Amplatz wire was placed.  A 12-French Renee Pain catheter was advanced into the collection and 45 ml of thick yellow fluid was removed.  Catheter was attached to a suction bulb.  Findings:The patient has complex air-fluid collection in the lower abdomen and pelvis.  The new drainage catheter was advanced further into this collection and additional thick bloody fluid was removed. Catheter was left near the midline.  A new 12-French drain was placed in the left upper abdominal fluid collection which is just posterior to the distended stomach.  Complications: None  Impression:Successful manipulation and upsizing of the left lower quadrant abdominal abscess drain.  Drain was upsized to a 14-French catheter.  Catheter was attached to a gravity bag.  Placement of a  new 12-French drainage catheter within the left upper quadrant abscess.  Sample sent for culture.  Original Report Authenticated By: Richarda Overlie, M.D.  Assessment/Plan: Abdominal abscess - continuing to observe off of antibiotics and drain in place.  Afebrile and relatively stable.  Thrombocytopenia continues to improve.  Will continue to watch, if decompensates, may consider restarting flagyl and levaquin.  Ceftriaxone or beta lactams more likely culprit of thrombocytopenia.    Kevonta Phariss Infectious Diseases 03/01/2012, 12:11 PM

## 2012-03-01 NOTE — Progress Notes (Addendum)
Patient ID: Vanessa [name], female   DOB: Apr 30, 1976, 36 y.o.   MRN: [id number] [facility] Progress Note 26 Days Post-Op  Subjective: Continues to have significant emesis.  >3 L out.  Having bloody stools as well.  Complains of severe LLQ pain.    Objective: Vital signs in last 24 hours: Temp:  [98.1 F (36.7 C)-98.8 F (37.1 C)] 98.4 F (36.9 C) (06/09 0400) Pulse Rate:  [85-112] 98  (06/09 0700) Resp:  [13-24] 15  (06/09 0700) BP: (124-152)/(78-105) 141/98 mmHg (06/09 0700) SpO2:  [97 %-100 %] 100 % (06/09 0700) Last BM Date: 02/29/12  Intake/Output from previous day: 06/08 0701 - 06/09 0700 In: 4831.9 [P.O.:100; I.V.:2376.9; Blood:305; TPN:1680] Out: 5910 [Urine:2275; Emesis/NG output:3500; Drains:85; Stool:50] Intake/Output this shift:   General: Afebrile.  Alert.  Lungs: Clear  Abd: Distended, tender LLQ.  Drains foul  Extremities: no edema  Neuro: appropriate  Lab Results:   CBC    Component Value Date/Time   WBC 10.9* 03/01/2012 0442   WBC 5.3 10/22/2010 1258   RBC 2.61* 03/01/2012 0442   RBC 3.48* 10/22/2010 1258   HGB 8.2* 03/01/2012 0442   HGB 11.2* 10/22/2010 1258   HCT 24.3* 03/01/2012 0442   HCT 33.1* 10/22/2010 1258   PLT 56* 03/01/2012 0442   PLT 287 10/22/2010 1258   MCV 93.1 03/01/2012 0442   MCV 94.9 10/22/2010 1258   MCH 31.4 03/01/2012 0442   MCH 29.1 09/19/2010 0847   MCHC 33.7 03/01/2012 0442   MCHC 33.8 10/22/2010 1258   RDW 19.2* 03/01/2012 0442   RDW 25.0* 10/22/2010 1258   LYMPHSABS 2.7 02/24/2012 0500   LYMPHSABS 0.5* 10/22/2010 1258   MONOABS 1.8* 02/24/2012 0500   MONOABS 0.6 10/22/2010 1258   EOSABS 0.0 02/24/2012 0500   EOSABS 0.1 10/22/2010 1258   BASOSABS 0.2* 02/24/2012 0500   BASOSABS 0.0 10/22/2010 1258    BMET  Basename 03/01/12 0442 02/29/12 0415  NA 132* 131*  K 4.4 4.1  CL 100 97  CO2 27 30  GLUCOSE 126* 115*  BUN 12 11  CREATININE 0.29* 0.32*  CALCIUM 10.0 9.8   PT/INR  Basename 02/28/12 0600  LABPROT 16.7*  INR 1.33   ABG No results  found for this basename: PHART:2,PCO2:2,PO2:2,HCO3:2 in the last 72 hours  Studies/Results: Ct Guided Abscess Drain  02/28/2012  *RADIOLOGY REPORT*  Clinical history:Complex medical history with multiple abdominal abscesses status post bowel surgery.  History of cervical cancer and severe thrombocytopenia.  PROCEDURE(S): CT GUIDED EXCHANGE OF LOWER ABDOMINAL ABSCESS CATHETER; PLACEMENT OF A NEW LEFT UPPER ABDOMINAL ABSCESS DRAIN WITH CT GUIDANCE  Physician: Rachelle Hora. Lowella Dandy, MD and Malachy Moan, MD  Medications:Versed 8 mg, Fentanyl 300 mcg. A radiology nurse monitored the patient for moderate sedation. The patient was given platelets before and during the procedure.  Moderate sedation time:55 minutes  Fluoroscopy time: 22 seconds CT fluoroscopy  Procedure:Informed consent was obtained for CT guided drain placements.  CT images through the abdomen were obtained.  The left lower quadrant drain was prepped and draped in a sterile fashion. The suture was removed.  The catheter was cut and a stiff Amplatz wire was advanced into the lower abdomen.  The catheter removed over wire.  A new 14-French pigtail catheter was placed over a wire and advanced into the right lower abdominal cavity.  Thick bloody fluid was aspirated.  The catheter was spun around and manipulated within the abdominal cavity.  The catheter was unable to be advanced cephalad.  Eventually, the catheter was left at the midline.  Catheter was sutured to the skin with a Prolene suture. The catheter was attached to a gravity bag.  Attention was directed to the left upper quadrant abscess collection.  The left lateral abdomen was prepped and draped in a sterile fashion and skin was anesthetized with lidocaine.  18 gauge needle was directed into the fluid collection posterior to the distended stomach.  Thick purulent yellow fluid was aspirated. Stiff Amplatz wire was placed.  A 12-French Renee Pain catheter was advanced into the collection and 45 ml of  thick yellow fluid was removed.  Catheter was attached to a suction bulb.  Findings:The patient has complex air-fluid collection in the lower abdomen and pelvis.  The new drainage catheter was advanced further into this collection and additional thick bloody fluid was removed. Catheter was left near the midline.  A new 12-French drain was placed in the left upper abdominal fluid collection which is just posterior to the distended stomach.  Complications: None  Impression:Successful manipulation and upsizing of the left lower quadrant abdominal abscess drain.  Drain was upsized to a 14-French catheter.  Catheter was attached to a gravity bag.  Placement of a new 12-French drainage catheter within the left upper quadrant abscess.  Sample sent for culture.  Original Report Authenticated By: Richarda Overlie, M.D.   Ct Abscess Cath Exchange  02/28/2012  *RADIOLOGY REPORT*  Clinical history:Complex medical history with multiple abdominal abscesses status post bowel surgery.  History of cervical cancer and severe thrombocytopenia.  PROCEDURE(S): CT GUIDED EXCHANGE OF LOWER ABDOMINAL ABSCESS CATHETER; PLACEMENT OF A NEW LEFT UPPER ABDOMINAL ABSCESS DRAIN WITH CT GUIDANCE  Physician: Rachelle Hora. Lowella Dandy, MD and Malachy Moan, MD  Medications:Versed 8 mg, Fentanyl 300 mcg. A radiology nurse monitored the patient for moderate sedation. The patient was given platelets before and during the procedure.  Moderate sedation time:55 minutes  Fluoroscopy time: 22 seconds CT fluoroscopy  Procedure:Informed consent was obtained for CT guided drain placements.  CT images through the abdomen were obtained.  The left lower quadrant drain was prepped and draped in a sterile fashion. The suture was removed.  The catheter was cut and a stiff Amplatz wire was advanced into the lower abdomen.  The catheter removed over wire.  A new 14-French pigtail catheter was placed over a wire and advanced into the right lower abdominal cavity.  Thick bloody fluid  was aspirated.  The catheter was spun around and manipulated within the abdominal cavity.  The catheter was unable to be advanced cephalad.  Eventually, the catheter was left at the midline.  Catheter was sutured to the skin with a Prolene suture. The catheter was attached to a gravity bag.  Attention was directed to the left upper quadrant abscess collection.  The left lateral abdomen was prepped and draped in a sterile fashion and skin was anesthetized with lidocaine.  18 gauge needle was directed into the fluid collection posterior to the distended stomach.  Thick purulent yellow fluid was aspirated. Stiff Amplatz wire was placed.  A 12-French Renee Pain catheter was advanced into the collection and 45 ml of thick yellow fluid was removed.  Catheter was attached to a suction bulb.  Findings:The patient has complex air-fluid collection in the lower abdomen and pelvis.  The new drainage catheter was advanced further into this collection and additional thick bloody fluid was removed. Catheter was left near the midline.  A new 12-French drain was placed in the left upper abdominal  fluid collection which is just posterior to the distended stomach.  Complications: None  Impression:Successful manipulation and upsizing of the left lower quadrant abdominal abscess drain.  Drain was upsized to a 14-French catheter.  Catheter was attached to a gravity bag.  Placement of a new 12-French drainage catheter within the left upper quadrant abscess.  Sample sent for culture.  Original Report Authenticated By: Richarda Overlie, M.D.    Anti-infectives: Anti-infectives     Start     Dose/Rate Route Frequency Ordered Stop   02/24/12 1600   levofloxacin (LEVAQUIN) IVPB 750 mg  Status:  Discontinued        750 mg 100 mL/hr over 90 Minutes Intravenous Every 24 hours 02/24/12 1526 02/26/12 1012   02/20/12 1415   cefTRIAXone (ROCEPHIN) 1 g in dextrose 5 % 50 mL IVPB  Status:  Discontinued        1 g 100 mL/hr over 30 Minutes  Intravenous Every 24 hours 02/20/12 1404 02/24/12 1526   02/19/12 1800   ciprofloxacin (CIPRO) IVPB 400 mg  Status:  Discontinued        400 mg 200 mL/hr over 60 Minutes Intravenous Every 12 hours 02/19/12 1612 02/20/12 1404   02/19/12 1700   metroNIDAZOLE (FLAGYL) IVPB 500 mg  Status:  Discontinued        500 mg 100 mL/hr over 60 Minutes Intravenous 3 times per day 02/19/12 1612 02/26/12 1012   02/12/12 0800   piperacillin-tazobactam (ZOSYN) IVPB 3.375 g  Status:  Discontinued        3.375 g 12.5 mL/hr over 240 Minutes Intravenous 3 times per day 02/12/12 0800 02/19/12 1612   02/10/12 1000   amoxicillin-clavulanate (AUGMENTIN) 875-125 MG per tablet 1 tablet  Status:  Discontinued        1 tablet Oral Every 12 hours 02/10/12 0823 02/12/12 0800   02/04/12 1800   cefOXitin (MEFOXIN) 1 g in dextrose 5 % 50 mL IVPB        1 g 100 mL/hr over 30 Minutes Intravenous Every 6 hours 02/04/12 1621 02/05/12 0617   02/04/12 0000   cefOXitin (MEFOXIN) 2 g in dextrose 5 % 50 mL IVPB        2 g 100 mL/hr over 30 Minutes Intravenous 60 min pre-op 02/03/12 0333 02/04/12 1145   02/03/12 0500   cefOXitin (MEFOXIN) 2 g in dextrose 5 % 50 mL IVPB  Status:  Discontinued        2 g 100 mL/hr over 30 Minutes Intravenous 60 min pre-op 02/02/12 1156 02/03/12 0333   02/03/12 0000   cefOXitin (MEFOXIN) 2 g in dextrose 5 % 50 mL IVPB  Status:  Discontinued        2 g 100 mL/hr over 30 Minutes Intravenous 60 min pre-op 02/02/12 1004 02/02/12 1156   01/27/12 2200   cefTRIAXone (ROCEPHIN) 1 g in dextrose 5 % 50 mL IVPB        1 g 100 mL/hr over 30 Minutes Intravenous Every 24 hours 01/27/12 2152 01/29/12 2359   01/27/12 1915   cefTRIAXone (ROCEPHIN) 1 g in dextrose 5 % 50 mL IVPB        1 g 100 mL/hr over 30 Minutes Intravenous  Once 01/27/12 1914 01/27/12 1957          Assessment/Plan: s/p Procedure(s): COLON RESECTION SIGMOID Nausea, high volume vomiting, on TPN Continue TNA.for prolonged NPO  status Antiemetics Pain control NGT Ok to place now as plts have started to rebound for  ileus.   May still need washout.  Would consider repeat CT scan after NGT suction to evaluate abscesses. At this point, if we give PO contrast, would worsen emesis.  However, surgery would be extremely difficult at this stage of healing.   GI bleed - transfuse as needed.  Currently stable.  Cause is likely thrombocytopenia    LOS: 34 days   03/01/2012

## 2012-03-01 NOTE — Progress Notes (Signed)
Pharmacy note:  Pain management  Asked to assist with Dilaudid PCA management starting 6/7.  Ms. Ebersole was on MS contin 15mg  at bedtime and 30mg  in the morning.  This has been causing nausea and vomiting and was stopped on 6/7.  NG tube placed, so requires IV pain medication.  Current PCA settings:  Basal rate 0.4mg /hr, bolus dose 0.3mg  every 8 minutes with 4 hour lockout of 5mg .  RN reports that the pump was misprogrammed yesterday where the patient did not have access to the bolus dose.  The basal infusion was running.  Pain score reported as 5 which is improved from an 8.  Patient is currently asleep and appears comfortable  Plan:  Continue the same PCA settings.  Celedonio Miyamoto, PharmD, BCPS Clinical Pharmacist Pager [phone]

## 2012-03-01 NOTE — Progress Notes (Signed)
eLink Physician-Brief Progress Note Patient Name: Vanessa [name] DOB: [birth year] MRN: [id number]  Date of Service  03/01/2012   HPI/Events of Note   K 5.3  eICU Interventions  Dc K in IVfs   Intervention Category Intermediate Interventions: Electrolyte abnormality - evaluation and management  Lylie Blacklock V. 03/01/2012, 7:23 PM

## 2012-03-01 NOTE — Progress Notes (Signed)
Subjective: The patient is seen and examined today. She is feeling much better but continues to have fatigue. She had a recent nosebleed. Her CBC today showed significant improvement in her platelets count with 56,000 compared to less than 5,000 few days ago. She has intolerance to oral Amicar with more nausea and her nurse asked if we can change it to IV form. She has no fever or chills. No abdominal pain.  Objective: Vital signs in last 24 hours: Temp:  [98.1 F (36.7 C)-98.8 F (37.1 C)] 98.4 F (36.9 C) (06/09 0400) Pulse Rate:  [85-112] 98  (06/09 0700) Resp:  [13-24] 15  (06/09 0700) BP: (124-152)/(78-105) 141/98 mmHg (06/09 0700) SpO2:  [97 %-100 %] 100 % (06/09 0700)  Intake/Output from previous day: 06/08 0800 - 06/09 0759 In: 4831.9 [P.O.:100; I.V.:2376.9; Blood:305; TPN:1680] Out: 5910 [Urine:2275; Emesis/NG output:3500; Drains:85; Stool:50] Intake/Output this shift:    General appearance: alert, cooperative and no distress Resp: clear to auscultation bilaterally Cardio: regular rate and rhythm, S1, S2 normal, no murmur, click, rub or gallop GI: Tender to palpation Extremities: extremities normal, atraumatic, no cyanosis or edema  Lab Results:   Basename 03/01/12 0442 02/29/12 1800  WBC 10.9* 11.2*  HGB 8.2* 6.7*  HCT 24.3* 20.4*  PLT 56* 38*   BMET  Basename 03/01/12 0442 02/29/12 0415  NA 132* 131*  K 4.4 4.1  CL 100 97  CO2 27 30  GLUCOSE 126* 115*  BUN 12 11  CREATININE 0.29* 0.32*  CALCIUM 10.0 9.8    Studies/Results: Ct Guided Abscess Drain  02/28/2012  *RADIOLOGY REPORT*  Clinical history:Complex medical history with multiple abdominal abscesses status post bowel surgery.  History of cervical cancer and severe thrombocytopenia.  PROCEDURE(S): CT GUIDED EXCHANGE OF LOWER ABDOMINAL ABSCESS CATHETER; PLACEMENT OF A NEW LEFT UPPER ABDOMINAL ABSCESS DRAIN WITH CT GUIDANCE  Physician: Rachelle Hora. Lowella Dandy, MD and Malachy Moan, MD  Medications:Versed 8 mg,  Fentanyl 300 mcg. A radiology nurse monitored the patient for moderate sedation. The patient was given platelets before and during the procedure.  Moderate sedation time:55 minutes  Fluoroscopy time: 22 seconds CT fluoroscopy  Procedure:Informed consent was obtained for CT guided drain placements.  CT images through the abdomen were obtained.  The left lower quadrant drain was prepped and draped in a sterile fashion. The suture was removed.  The catheter was cut and a stiff Amplatz wire was advanced into the lower abdomen.  The catheter removed over wire.  A new 14-French pigtail catheter was placed over a wire and advanced into the right lower abdominal cavity.  Thick bloody fluid was aspirated.  The catheter was spun around and manipulated within the abdominal cavity.  The catheter was unable to be advanced cephalad.  Eventually, the catheter was left at the midline.  Catheter was sutured to the skin with a Prolene suture. The catheter was attached to a gravity bag.  Attention was directed to the left upper quadrant abscess collection.  The left lateral abdomen was prepped and draped in a sterile fashion and skin was anesthetized with lidocaine.  18 gauge needle was directed into the fluid collection posterior to the distended stomach.  Thick purulent yellow fluid was aspirated. Stiff Amplatz wire was placed.  A 12-French Renee Pain catheter was advanced into the collection and 45 ml of thick yellow fluid was removed.  Catheter was attached to a suction bulb.  Findings:The patient has complex air-fluid collection in the lower abdomen and pelvis.  The new drainage catheter  was advanced further into this collection and additional thick bloody fluid was removed. Catheter was left near the midline.  A new 12-French drain was placed in the left upper abdominal fluid collection which is just posterior to the distended stomach.  Complications: None  Impression:Successful manipulation and upsizing of the left lower  quadrant abdominal abscess drain.  Drain was upsized to a 14-French catheter.  Catheter was attached to a gravity bag.  Placement of a new 12-French drainage catheter within the left upper quadrant abscess.  Sample sent for culture.  Original Report Authenticated By: Richarda Overlie, M.D.   Ct Abscess Cath Exchange  02/28/2012  *RADIOLOGY REPORT*  Clinical history:Complex medical history with multiple abdominal abscesses status post bowel surgery.  History of cervical cancer and severe thrombocytopenia.  PROCEDURE(S): CT GUIDED EXCHANGE OF LOWER ABDOMINAL ABSCESS CATHETER; PLACEMENT OF A NEW LEFT UPPER ABDOMINAL ABSCESS DRAIN WITH CT GUIDANCE  Physician: Rachelle Hora. Lowella Dandy, MD and Malachy Moan, MD  Medications:Versed 8 mg, Fentanyl 300 mcg. A radiology nurse monitored the patient for moderate sedation. The patient was given platelets before and during the procedure.  Moderate sedation time:55 minutes  Fluoroscopy time: 22 seconds CT fluoroscopy  Procedure:Informed consent was obtained for CT guided drain placements.  CT images through the abdomen were obtained.  The left lower quadrant drain was prepped and draped in a sterile fashion. The suture was removed.  The catheter was cut and a stiff Amplatz wire was advanced into the lower abdomen.  The catheter removed over wire.  A new 14-French pigtail catheter was placed over a wire and advanced into the right lower abdominal cavity.  Thick bloody fluid was aspirated.  The catheter was spun around and manipulated within the abdominal cavity.  The catheter was unable to be advanced cephalad.  Eventually, the catheter was left at the midline.  Catheter was sutured to the skin with a Prolene suture. The catheter was attached to a gravity bag.  Attention was directed to the left upper quadrant abscess collection.  The left lateral abdomen was prepped and draped in a sterile fashion and skin was anesthetized with lidocaine.  18 gauge needle was directed into the fluid collection  posterior to the distended stomach.  Thick purulent yellow fluid was aspirated. Stiff Amplatz wire was placed.  A 12-French Renee Pain catheter was advanced into the collection and 45 ml of thick yellow fluid was removed.  Catheter was attached to a suction bulb.  Findings:The patient has complex air-fluid collection in the lower abdomen and pelvis.  The new drainage catheter was advanced further into this collection and additional thick bloody fluid was removed. Catheter was left near the midline.  A new 12-French drain was placed in the left upper abdominal fluid collection which is just posterior to the distended stomach.  Complications: None  Impression:Successful manipulation and upsizing of the left lower quadrant abdominal abscess drain.  Drain was upsized to a 14-French catheter.  Catheter was attached to a gravity bag.  Placement of a new 12-French drainage catheter within the left upper quadrant abscess.  Sample sent for culture.  Original Report Authenticated By: Richarda Overlie, M.D.    Medications: I have reviewed the patient's current medications.  Assessment/Plan: This is a very pleasant 36 years old [place] American female with history of cervical cancer. 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 She started feeling better the last  few days with improvement in her platelets count and a stable hemoglobin and hematocrit. #1 I will change oral Amicar to IV 1 g every 6 hours. #2 I would discontinue the Decadron started yesterday by Dr. Darnelle Catalan because of the overwhelming inflammatory and infectious process. #3 we'll continue to monitor the patient closely with daily CBC.   LOS: 34 days    Gamal Todisco K. 03/01/2012

## 2012-03-02 DIAGNOSIS — D693 Immune thrombocytopenic purpura: Secondary | ICD-10-CM

## 2012-03-02 LAB — DIFFERENTIAL
Basophils Absolute: 0 10*3/uL (ref 0.0–0.1)
Eosinophils Relative: 1 % (ref 0–5)
Lymphocytes Relative: 16 % (ref 12–46)
Lymphs Abs: 2 10*3/uL (ref 0.7–4.0)
Monocytes Relative: 14 % — ABNORMAL HIGH (ref 3–12)
Neutrophils Relative %: 69 % (ref 43–77)
WBC Morphology: INCREASED

## 2012-03-02 LAB — COMPREHENSIVE METABOLIC PANEL
ALT: 19 U/L (ref 0–35)
CO2: 25 mEq/L (ref 19–32)
Calcium: 9.4 mg/dL (ref 8.4–10.5)
Creatinine, Ser: 0.4 mg/dL — ABNORMAL LOW (ref 0.50–1.10)
GFR calc Af Amer: >90 mL/min (ref >90)
GFR calc non Af Amer: >90 mL/min (ref >90)
Glucose, Bld: 706 mg/dL (ref 70–99)
Sodium: 128 mEq/L — ABNORMAL LOW (ref 135–145)
Total Protein: 5.4 g/dL — ABNORMAL LOW (ref 6.0–8.3)

## 2012-03-02 LAB — CBC
Hemoglobin: 8.7 g/dL — ABNORMAL LOW (ref 12.0–15.0)
MCH: 30.7 pg (ref 26.0–34.0)
MCHC: 33.1 g/dL (ref 30.0–36.0)
MCV: 98.7 fL (ref 78.0–100.0)
Platelets: 85 10*3/uL — ABNORMAL LOW (ref 150–400)
RBC: 2.24 MIL/uL — ABNORMAL LOW (ref 3.87–5.11)
RDW: 22.1 % — ABNORMAL HIGH (ref 11.5–15.5)
RDW: 20.8 % — ABNORMAL HIGH (ref 11.5–15.5)
WBC: 12.8 10*3/uL — ABNORMAL HIGH (ref 4.0–10.5)

## 2012-03-02 LAB — BASIC METABOLIC PANEL
BUN: 11 mg/dL (ref 6–23)
Creatinine, Ser: 0.37 mg/dL — ABNORMAL LOW (ref 0.50–1.10)
GFR calc Af Amer: >90 mL/min (ref >90)
GFR calc non Af Amer: >90 mL/min (ref >90)
Glucose, Bld: 86 mg/dL (ref 70–99)

## 2012-03-02 LAB — PREALBUMIN: Prealbumin: 21.5 mg/dL (ref 17.0–34.0)

## 2012-03-02 LAB — CULTURE, ROUTINE-ABSCESS

## 2012-03-02 LAB — MAGNESIUM: Magnesium: 1.9 mg/dL (ref 1.5–2.5)

## 2012-03-02 LAB — HLA A AND B PHENOTYPE

## 2012-03-02 LAB — PHOSPHORUS: Phosphorus: 5 mg/dL — ABNORMAL HIGH (ref 2.3–4.6)

## 2012-03-02 LAB — CHOLESTEROL, TOTAL: Cholesterol: 75 mg/dL (ref 0–200)

## 2012-03-02 MED ORDER — CHLORHEXIDINE GLUCONATE 0.12 % MT SOLN
15.0000 mL | Freq: Two times a day (BID) | OROMUCOSAL | Status: DC
Start: 2012-03-02 — End: 2012-03-12
  Administered 2012-03-02 – 2012-03-05 (×7): 15 mL via OROMUCOSAL
  Filled 2012-03-02 (×11): qty 15

## 2012-03-02 MED ORDER — BIOTENE DRY MOUTH MT LIQD
15.0000 mL | Freq: Four times a day (QID) | OROMUCOSAL | Status: DC
Start: 2012-03-02 — End: 2012-03-12
  Administered 2012-03-02 – 2012-03-06 (×12): 15 mL via OROMUCOSAL

## 2012-03-02 MED ORDER — WHITE PETROLATUM GEL
Status: AC
  Administered 2012-03-02: 20:00:00
  Filled 2012-03-02: qty 5

## 2012-03-02 MED ORDER — ZINC TRACE METAL 1 MG/ML IV SOLN
INTRAVENOUS | Status: AC
  Administered 2012-03-02: 18:00:00 via INTRAVENOUS
  Filled 2012-03-02: qty 2000

## 2012-03-02 NOTE — Progress Notes (Signed)
INFECTIOUS DISEASE PROGRESS NOTE  ID: Vanessa [name] is a 36 y.o. female with complex medical history of cervical CA, s/p XRT in Jan 2012 s/p colectomy and divertin ileostomy ion 02/04/12 POD #27 complicated with multiple abdominal abscesses status post bowel surgery placed on broad spectrum abtx for polymicrobial peritoneal abscess complicated by bleeding and thrombocytopenia, off of antibiotics since 6/4, peritoneal drain repositioned on 6/7  Subjective: Remains afebrile (although has not mounted a fever in the course of her admission), still has some abdominal pain but under better control with pca. She denies fevers, chills, nightsweats.  24hr: wbc increased from 10.9 to 12.8 (with increasing bands); plt increased from 56 to 85; BS in 700s  Abtx:  Off of abtx since 6/4  Ceftriaxone d/c'd 6/3 Metronidazole d/c'd 6/4 Levo on 6/3 and 6/4  Medications:     . alteplase  2 mg Intracatheter Once  . barrier cream  1 application Topical TID  . folic acid  1 mg Intravenous Daily  . HYDROmorphone PCA 0.3 mg/mL   Intravenous Q4H  . magnesium sulfate 1 - 4 g bolus IVPB  2 g Intravenous Once  . metoCLOPramide (REGLAN) injection  5 mg Intravenous Q6H  . oxymetazoline  2 spray Each Nare BID  . prochlorperazine  5 mg Intravenous QID  . sodium chloride  10-40 mL Intracatheter Q12H  . DISCONTD: insulin aspart  0-9 Units Subcutaneous Q4H  . DISCONTD: sodium chloride 0.9 % 50 mL with aminocaproic acid (AMICAR) 1 g infusion   Intravenous Q6H    Objective: Vital signs in last 24 hours: Temp:  [98.2 F (36.8 C)-99.3 F (37.4 C)] 98.3 F (36.8 C) (06/10 0808) Pulse Rate:  [92-106] 105  (06/10 1000) Resp:  [11-19] 18  (06/10 1000) BP: (111-135)/(73-90) 123/80 mmHg (06/10 1000) SpO2:  [99 %-100 %] 100 % (06/10 1000) General appearance: alert, in no distress and appears fatigued, chronically ill, strong necrotic smell  HEENt= og in place to decompress stomach. Resp: clear to auscultation  bilaterally  Cardio: Tachy, RR  GI: + drain in place, staples c/d/i,  Extremities: extremities normal, atraumatic, no cyanosis or edema      Lab Results  Basename 03/02/12 0445 03/01/12 1625 03/01/12 0442  WBC 12.8* -- 10.9*  HGB 6.9* -- 8.2*  HCT 22.1* -- 24.3*  NA 128* 130* --  K 4.9 5.3* --  CL 97 99 --  CO2 25 27 --  BUN 10 11 --  CREATININE 0.40* 0.40* --  GLU -- -- --   Liver Panel  Basename 03/02/12 0445  PROT 5.4*  ALBUMIN 1.6*  AST 18  ALT 19  ALKPHOS 99  BILITOT 0.6  BILIDIR --  IBILI --    Microbiology: Recent Results (from the past 240 hour(s))  CULTURE, ROUTINE-ABSCESS     Status: Normal   Collection Time   02/28/12  3:52 PM      Component Value Range Status Comment   Specimen Description ABSCESS PERITONEAL CAVITY   Final    Special Requests LEFT UPPER QUADRANT ABSCESS,YELLOW PURULENT FLUID   Final    Gram Stain     Final    Value: ABUNDANT WBC PRESENT,BOTH PMN AND MONONUCLEAR     NO SQUAMOUS EPITHELIAL CELLS SEEN     ABUNDANT GRAM POSITIVE COCCI IN PAIRS     MODERATE GRAM NEGATIVE COCCOBACILLI   Culture     Final    Value: MODERATE ESCHERICHIA COLI     ABUNDANT VIRIDANS STREPTOCOCCUS   Report Status  03/02/2012 FINAL   Final    Organism ID, Bacteria ESCHERICHIA COLI   Final      Assessment/Plan: 36yo F with hx of cervical Ca, XRT with numerous pelvic adhesions s/p colectomy and diverting ileostomy in 02/04/12 complicated by multiple peritoneal abscess( polymicrobial including pansensitive strep), c/b bleeding and thrombocytopenia likely from betalactams. Currently off of antibiotics, normotensive, no fevers, but leukocytosis of 12.8 increased from 10.9  1) if WBC continues to trend up, will need to restart ciprofloxacin and metronidazole +/- re-image to ensure there is appropriate drainage of abscess vs. Surgery.   2) thrombocytopenia improving but not back to baseline.  Oluwanifemi Susman Infectious Diseases 03/02/2012, 10:29 AM

## 2012-03-02 NOTE — Progress Notes (Signed)
PARENTERAL NUTRITION CONSULT NOTE - FOLLOW UP  Pharmacy Consult for TPN Indication: intraabdominal abscess, ileus  Allergies  Allergen Reactions  . Labetalol Hcl Itching and Other (See Comments)    Bumps to bilateral arms.  . Morphine And Related Hives   Patient Measurements: Height: 5\' 4"  (162.6 cm) Weight: 164 lb 3.2 oz (74.481 kg) IBW/kg (Calculated) : 54.7   Vital Signs: Temp: 98.3 F (36.8 C) (06/10 1610) Temp src: Oral (06/10 0808) BP: 111/78 mmHg (06/10 0000) Intake/Output from previous day: 06/09 0701 - 06/10 0700 In: 3835 [I.V.:1995; NG/GT:60; IV Piggyback:50; TPN:1680] Out: 5805 [Urine:1000; Emesis/NG output:4250; Drains:325; Stool:230] Intake/Output from this shift:  Labs:  [facility] 03/02/12 0445 03/01/12 0442 02/29/12 1800  WBC 12.8* 10.9* 11.2*  HGB 6.9* 8.2* 6.7*  HCT 22.1* 24.3* 20.4*  PLT 85* 56* 38*  APTT -- -- --  INR -- -- --    Basename 03/02/12 0445 03/01/12 1625 03/01/12 0442  NA 128* 130* 132*  K 4.9 5.3* 4.4  CL 97 99 100  CO2 25 27 27   GLUCOSE 706* 526* 126*  BUN 10 11 12   CREATININE 0.40* 0.40* 0.29*  LABCREA -- -- --  CREAT24HRUR -- -- --  CALCIUM 9.4 9.6 10.0  MG 1.9 -- 1.7  PHOS 5.0* -- 3.0  PROT 5.4* -- --  ALBUMIN 1.6* -- --  AST 18 -- --  ALT 19 -- --  ALKPHOS 99 -- --  BILITOT 0.6 -- --  BILIDIR -- -- --  IBILI -- -- --  PREALBUMIN -- -- --  TRIG 135 -- --  CHOLHDL -- -- --  CHOL 75 -- --   Estimated Creatinine Clearance: 96.1 ml/min (by C-G formula based on Cr of 0.4).   Basename 03/01/12 2016 03/01/12 1622 03/01/12 1212  GLUCAP 110* 95 103*   Insulin Requirements in the past 24 hours:  3 units (q4h sensitive) SSI; NO insulin in TNA    Current Nutrition:  - Clinimix E5/20 at 70 ml/hr (at goal) - Avoiding lipids due to possible contribution to thrombocytopenia.   - NPO  Nutritional Goals:  1800-2000 kCal, 80-95 grams of protein per day.    Clinimix E5/20 goal rate of 39ml/hr will provide an average of  1478 kcal and 84g protein per day based on RD assessment (82% total kcal and 100% protein needs)  Assessment: 9 yof with a history of cervical cancer who is s/p sigmoid colectomy and diverting ileostomy on 5/14. Developed multiple intra-abdominal abscesses d/t anastomotic leak and had 3 perc drains placed. On 5/29 patient had multiple blood BM, hemoptysis, and abdominal pain. Plts dropped to <5 (240k on 5/25) but have now improved to 85. CT 5/30 showed additional abscesses and concern for fistula formation.   GI:  CT scan abdomen and pelvis 6/6 - Continues to show persistent abscess collections in the upper abd/pelvis. OR plans deferred d/t low plts. High volume emesis/NGT output-scheduled reglan continues. Off PPI and H2RA d/t thrombocytopenia risk.Doubt any PO absorption.  Endo: No hx DM. CBG controlled.  Isolated BG 706 contaminated from TPN line. Minimal SSI requirement.   Lytes: CMET is contaminated with TPN; cannot reliably assess K, Phos and blood glucose. Na low-? Fluid o/load. Acid-base status ok.  Renal: SCr good, UOP 0.6 ml/kg/hr.    Pulm: RA  Cards:Hemodynamically stable  Hepatobil: Alk phos/LFTs/bili wnl. Albumin remains low.  Triglycerides and cholesterol WNL, prealbumin pending.  Neuro: standing po morphine BID and dilaudid PCA   Heme/Onc: Persistent thrombocytopenia improving with steroids, IVIG,  N-Plate, discontinuation of multiple offending meds. Nasal and rectal bleeding decreased, but still needing transfusions for low Hgb; she received tranexamic acid (6/3) and is receiving amicar. Noted several blood product transfusions to date.    ID: Pt with known peritoneal infection now off all Abx (CCM stopped all medications that could induce thrombocytopenia); WBC remains elevated, stable; afebrile.   Best Practices: SCDs  Plan:  - Continue Clinimix E 5/20 at goal of 70 ml/hr  - Will d/c SSI and CBG checks - MVI and Trace elements only d/t national shortage - Fat emulsion  omitted d/t to chance of worsening thrombocytopenia. Risk of EFAD if fats completely avoided for 3-4 weeks- consider resuming IV fat emulsion the week of 6/24 if TPN continues. - f/u AM labs- left communication for RN to follow correct procedure when drawing from PICC line to avoid TPN contamination.  Laryssa Hassing K. Allena Katz, PharmD, BCPS.  Clinical Pharmacist Pager [phone]. 03/02/2012 8:44 AM

## 2012-03-02 NOTE — Progress Notes (Signed)
27 Days Post-Op  Subjective: Cervical ca; post op abscesses Drains x 4 Pt actually seems better today Up in bed; smiling plts 85  Objective: Vital signs in last 24 hours: Temp:  [98.2 F (36.8 C)-99.3 F (37.4 C)] 98.3 F (36.8 C) (06/10 0808) Pulse Rate:  [92-125] 99  (06/09 2000) Resp:  [11-19] 14  (06/10 0000) BP: (111-151)/(73-98) 111/78 mmHg (06/10 0000) SpO2:  [99 %-100 %] 100 % (06/09 2000) Last BM Date: 03/01/12  Intake/Output from previous day: 06/09 0701 - 06/10 0700 In: 3835 [I.V.:1995; NG/GT:60; IV Piggyback:50; TPN:1680] Out: 5805 [Urine:1000; Emesis/NG output:4250; Drains:325; Stool:230] Intake/Output this shift:    PE:  All drains intact; new dressings applied; all clean and dry output unchanged LLQ by far most output at 350 cc bloody purulent fluid 6/9 Other drains 10-15 cc in 24 hrs plts 85 today Pt in better spirits Wbc 12.8  Lab Results:   Basename 03/02/12 0445 03/01/12 0442  WBC 12.8* 10.9*  HGB 6.9* 8.2*  HCT 22.1* 24.3*  PLT 85* 56*   BMET  Basename 03/02/12 0445 03/01/12 1625  NA 128* 130*  K 4.9 5.3*  CL 97 99  CO2 25 27  GLUCOSE 706* 526*  BUN 10 11  CREATININE 0.40* 0.40*  CALCIUM 9.4 9.6   PT/INR No results found for this basename: LABPROT:2,INR:2 in the last 72 hours ABG No results found for this basename: PHART:2,PCO2:2,PO2:2,HCO3:2 in the last 72 hours  Studies/Results: No results found.  Anti-infectives:   Assessment/Plan: s/p Procedure(s) (LRB): COLON RESECTION SIGMOID (N/A)  Post op abd abscesses Drains x4 placed in IR All have minimal output except LLQ drain bag: 350 cc 6/9 Pt seems better; smiling Afeb; plts 85   Shalae Belmonte A 03/02/2012

## 2012-03-02 NOTE — Progress Notes (Addendum)
27 Days Post-Op  Subjective: Feeling much better, tolerating OG tube, only mild abdominal pain today  Objective: Vital signs in last 24 hours: Temp:  [98.2 F (36.8 C)-99.3 F (37.4 C)] 98.3 F (36.8 C) (06/10 0808) Pulse Rate:  [92-125] 99  (06/09 2000) Resp:  [11-19] 14  (06/10 0000) BP: (111-151)/(73-98) 111/78 mmHg (06/10 0000) SpO2:  [99 %-100 %] 100 % (06/09 2000) Last BM Date: 03/01/12  Intake/Output from previous day: 06/09 0701 - 06/10 0700 In: 3835 [I.V.:1995; NG/GT:60; IV Piggyback:50; TPN:1680] Out: 5805 [Urine:1000; Emesis/NG output:4250; Drains:325; Stool:230] Intake/Output this shift:    General appearance: alert and cooperative Nose: nasal packing with dry stain Resp: clear to auscultation bilaterally Cardio: regular rate and rhythm GI: soft, mild LLQ tenderness, drain in place, lower midline wound clean and granulating, ileostomy with brown liquid, few BS  Lab Results:   [facility] 03/02/12 0445 03/01/12 0442  WBC 12.8* 10.9*  HGB 6.9* 8.2*  HCT 22.1* 24.3*  PLT 85* 56*   BMET  Basename 03/02/12 0445 03/01/12 1625  NA 128* 130*  K 4.9 5.3*  CL 97 99  CO2 25 27  GLUCOSE 706* 526*  BUN 10 11  CREATININE 0.40* 0.40*  CALCIUM 9.4 9.6   PT/INR No results found for this basename: LABPROT:2,INR:2 in the last 72 hours ABG No results found for this basename: PHART:2,PCO2:2,PO2:2,HCO3:2 in the last 72 hours  Studies/Results: No results found.  Anti-infectives: Anti-infectives     Start     Dose/Rate Route Frequency Ordered Stop   02/24/12 1600   levofloxacin (LEVAQUIN) IVPB 750 mg  Status:  Discontinued        750 mg 100 mL/hr over 90 Minutes Intravenous Every 24 hours 02/24/12 1526 02/26/12 1012   02/20/12 1415   cefTRIAXone (ROCEPHIN) 1 g in dextrose 5 % 50 mL IVPB  Status:  Discontinued        1 g 100 mL/hr over 30 Minutes Intravenous Every 24 hours 02/20/12 1404 02/24/12 1526   02/19/12 1800   ciprofloxacin (CIPRO) IVPB 400 mg  Status:   Discontinued        400 mg 200 mL/hr over 60 Minutes Intravenous Every 12 hours 02/19/12 1612 02/20/12 1404   02/19/12 1700   metroNIDAZOLE (FLAGYL) IVPB 500 mg  Status:  Discontinued        500 mg 100 mL/hr over 60 Minutes Intravenous 3 times per day 02/19/12 1612 02/26/12 1012   02/12/12 0800   piperacillin-tazobactam (ZOSYN) IVPB 3.375 g  Status:  Discontinued        3.375 g 12.5 mL/hr over 240 Minutes Intravenous 3 times per day 02/12/12 0800 02/19/12 1612   02/10/12 1000   amoxicillin-clavulanate (AUGMENTIN) 875-125 MG per tablet 1 tablet  Status:  Discontinued        1 tablet Oral Every 12 hours 02/10/12 0823 02/12/12 0800   02/04/12 1800   cefOXitin (MEFOXIN) 1 g in dextrose 5 % 50 mL IVPB        1 g 100 mL/hr over 30 Minutes Intravenous Every 6 hours 02/04/12 1621 02/05/12 0617   02/04/12 0000   cefOXitin (MEFOXIN) 2 g in dextrose 5 % 50 mL IVPB        2 g 100 mL/hr over 30 Minutes Intravenous 60 min pre-op 02/03/12 0333 02/04/12 1145   02/03/12 0500   cefOXitin (MEFOXIN) 2 g in dextrose 5 % 50 mL IVPB  Status:  Discontinued        2 g 100 mL/hr  over 30 Minutes Intravenous 60 min pre-op 02/02/12 1156 02/03/12 0333   02/03/12 0000   cefOXitin (MEFOXIN) 2 g in dextrose 5 % 50 mL IVPB  Status:  Discontinued        2 g 100 mL/hr over 30 Minutes Intravenous 60 min pre-op 02/02/12 1004 02/02/12 1156   01/27/12 2200   cefTRIAXone (ROCEPHIN) 1 g in dextrose 5 % 50 mL IVPB        1 g 100 mL/hr over 30 Minutes Intravenous Every 24 hours 01/27/12 2152 01/29/12 2359   01/27/12 1915   cefTRIAXone (ROCEPHIN) 1 g in dextrose 5 % 50 mL IVPB        1 g 100 mL/hr over 30 Minutes Intravenous  Once 01/27/12 1914 01/27/12 1957          Assessment/Plan: s/p Procedure(s) (LRB): COLON RESECTION SIGMOID (N/A) S/P sigmoid colectomy and ileostomy with anastamotic leak Abscesses - drain repositioned and new drain placed by IR last Friday, off ABX due to thrombocytopenia ID - off ABX as  above, appreciate ID F/U Thrombocytopenia - likely med re;lated, significant improvement Anemia ABL - transfuse 1u PRBC Ileus - significant OGT output, continue for now FEN - TNA, K better, Na lower -  IVF at [facility] Overall significant improvement since Friday.  No need for further surgery at this point but will see how she does.  LOS: 35 days    Vanessa [name] 03/02/2012

## 2012-03-02 NOTE — Progress Notes (Signed)
Name: Vanessa [name] MRN: [id number] DOB: 1975-10-13    LOS: 35  Referring Provider:  CCS  Reason for Referral:  Thrombocytopenia    PULMONARY / CRITICAL CARE MEDICINE  Brief patient description:  36 y/o F with Hx of cervical cancer s/p chemo / radiation (2012), admitted 5/6 with abd pain, n/v now with abdominal adhesions / abscess and profound thrombocytopenia -not responsive to infusion.  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  Fixed sigmoid with sharp angulation 5/14 Colon resection with sigmoid / diverting loop ileostomy 5/23 Perc Drain by IR of abd abscesses 5/31 PICC line placed Received PRBC 6/2, 6/3, 6/4, 6/5, 6/6, 6/7 ~3000cc BRBPR 6/5  Subjective/Interval events:  bno pressors, better with OGT, no bleeding  Vital Signs: Temp:  [98.2 F (36.8 C)-99.3 F (37.4 C)] 98.6 F (37 C) (06/10 1135) Pulse Rate:  [96-114] 114  (06/10 1100) Resp:  [11-23] 23  (06/10 1100) BP: (111-139)/(73-85) 139/85 mmHg (06/10 1100) SpO2:  [99 %-100 %] 100 % (06/10 1100)   Intake/Output Summary (Last 24 hours) at 03/02/12 1216 Last data filed at 03/02/12 1000  Gross per 24 hour  Intake   3595 ml  Output   4180 ml  Net   -585 ml    Physical Examination: General:  Chronically ill, appears older than age Neuro: follows commands Cardiovascular:  s1s2 rrr, no m/r/g Lungs:  resp's even/non-labored, CTA anterior Abdomen:  Round / soft, tender,  3 JP drains, RLQ ileostomy, NG tube with large volume emesis  Musculoskeletal:  No obvious deformities Skin:  Warm/dry  Principal Problem:  *Sigmoid stricture Active Problems:  Cervical cancer  Post-radiation rectal bleeding  Pyelonephritis  Nausea & vomiting  Hypercalcemia  Radiation colitis  Hemorrhage of rectum and anus  Acute posthemorrhagic anemia  Thrombocytopenia   ASSESSMENT AND PLAN  PULMONARY  CXR:  none ETT:  none  A:   At risk  ATX P:   -at risk ATX  CARDIOVASCULAR  ECG:   Lines:  RUE PICC 5/31>>>  A:  Hypertension- improved P:  -she is allergic to beta blockers, PRN hydralazine  -ICU monitoring -controlling pain  RENAL  Lab 03/02/12 0445 03/01/12 1625 03/01/12 0442 02/29/12 0415 02/28/12 0600 02/27/12 0500 02/26/12 0402  NA 128* 130* 132* 131* 132* -- --  K 4.9 5.3* -- -- -- -- --  CL 97 99 100 97 98 -- --  CO2 25 27 27 30 30  -- --  BUN 10 11 12 11 10  -- --  CREATININE 0.40* 0.40* 0.29* 0.32* 0.32* -- --  CALCIUM 9.4 9.6 10.0 9.8 9.5 -- --  MG 1.9 -- 1.7 -- 1.6 1.4* 1.5  PHOS 5.0* -- 3.0 -- 3.0 2.2* 3.2   Intake/Output      06/09 0701 - 06/10 0700 06/10 0701 - 06/11 0700   P.O.     I.V. (mL/kg) 2220 (29.8) 175 (2.3)   Blood     Other 50    NG/GT 60    IV Piggyback 50    TPN 1680 210   Total Intake(mL/kg) 4060 (54.5) 385 (5.2)   Urine (mL/kg/hr) 1000 (0.6) 375 (1)   Emesis/NG output 4250 600   Drains 325    Stool 230    Total Output 5805 975   Net -1745 -590         Foley:    A:   Hyponatremia Hypophosphatemia  P:   -TNA per Pharmacy >>  fats and pepcid removed to avoid thrombocytopenia -f/u chem repeat, error? Hyponatremia, tpn affect? Repeat labs  GASTROINTESTINAL  Lab 03/02/12 0445 02/27/12 0500  AST 18 12  ALT 19 11  ALKPHOS 99 47  BILITOT 0.6 0.4  PROT 5.4* 4.6*  ALBUMIN 1.6* 1.8*   CT ABD/Pelvis 6/7>>>Multiple persistent abscess collections in the upper abdomen and pelvis, including persistent subcapsular collections at the liver  and spleen. Persistent rectal wall thickening.  Mild degree of wall of small bowel distention could reflect a  partial degree of obstruction or ileus.   A:   Abdominal Pain. S/P sigmoid colectomy with primary anastomosis and diverting ileostomy. Probable anastomotic leak, multiple inta abd abscesses.  S/p new drain placement in IR 6/8 now with total 4 drains.  P: -pain control  -Recs per CCS - no surgery recommended currently,  continue draining -WOC for ileostomy  A: ? Ileus/nausea/vomiting P: -Zofran, phenergan PRN  -Reglan added 6/8 -Keep NPO  Rectal bleeding- continues to have large amt of BRB mixed with large clots - resolving 6/8, 9,10 P:   -continue to monitor -f/u CBC -transfuse PRBC as needed for 1 unit PRBC  HEMATOLOGIC  Lab 03/02/12 0445 03/01/12 0442 02/29/12 1800 02/29/12 0415 02/28/12 1825 02/28/12 0600 02/26/12 0400  HGB 6.9* 8.2* 6.7* 8.1* 5.6* -- --  HCT 22.1* 24.3* 20.4* 23.3* 16.2* -- --  PLT 85* 56* 38* 17* 10* -- --  INR -- -- -- -- -- 1.33 1.39  APTT -- -- -- -- -- 32 28   Hematology panel 6/1: no schitocytes, haptoglogin 154, fibrinogen 410  A:   Refractory Thrombocytopenia, suspect d/t consumption vs. Drug related (? Cephlosporin).  Cannot r/o antibody induced drop. S/p chemo / radiation for cervical cancer. Last treatment in 10/2010. Appreciate Dr Precious Reel input - no evidence for TTP, HITT, or DIC. 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 Suspect due to meds, but all possible culprits have been stopped. Has failed Amicar, IVIg, and steroid treatment.   **plt trending up 6/9.  Off abx x 4 days ? abx as culprit.   P: -f/u hematology recs -improved plat today Continue to drain infection and follow trend ENT removed packing  Anemia -secondary to bleeding from nose and rectum, 2 u PRBCs 6/3, 6/6, 6/8.  Cont to have ?worsenig rectal bleeding.  P: -Nasal packing per ENT - think this bleeding has stopped, removed -1 unit oprdered prbc Cbc in am   INFECTIOUS  Lab 03/02/12 0445 03/01/12 0442 02/29/12 1800 02/29/12 0415 02/28/12 1825  WBC 12.8* 10.9* 11.2* 10.8* 11.6*  PROCALCITON -- <0.10 -- -- --   Cultures: UC 5/6>>>neg 5/13 MRSA PCR>>>neg 5/23 Abd abscess drain>>>multiple organisims 5/23 Abd drainage>>>few e-Coli 5/23 RU abd drain>>>rare viridans, abundant bacteroides fragilis, beta lactam positive 6/2 HIV>>> neg  Antibiotics: Augmentin  5/20>>5/22 Cefoxitin 5/13>>>5/15 Rocephin 5/6>>>5/8, 5/30>>>6/3 Cipro 5/29>>>5/30 Flagyl 5/29>>>6/5 Zosyn 5/22>>>5/29 Levaquin 6/3>>>6/5  A:   Abdominal Abscess - IR drains in place P:   -All ABX d/c'd 6/5  ??they are the culprit for persistant thrombocytopenia with possible rebound of plt 6/8 so will cont to hold abx for now  -follow closely for signs evolving infection Drain sin place  ENDOCRINE  Lab 03/01/12 2016 03/01/12 1622 03/01/12 1212 03/01/12 0826 03/01/12 0415  GLUCAP 110* 95 103* 124* 128*   A:   Hyperglycemia- well controled  P:   -monitor CBG glucose -Continue SSI  NEUROLOGIC  A:  Anxiety  P:   -supportive care -PRN  ativan  Mobilize when able, PT?  BEST PRACTICE / DISPOSITION Level of Care:  ICU Primary Service:  CCS Consultants:  PCCM, Hematology, GI Code Status:  Full Diet:  Clear DVT Px:  scd's GI Px:  pepcid Skin Integrity:  intact Social / Family:   Mcarthur Rossetti. Tyson Alias, MD, FACP Pgr: [phone] [facility]

## 2012-03-02 NOTE — Progress Notes (Signed)
Steady rise in platelet count over the last 72 hours! 85,000 today. Multiple interventions: steroids, IVIG, N-Plate (2nd weekly dose given 6/8).Multiple meds discontinued. Surgical drains placed to partially drain intra-abdominal abscesses. PE: Left nasal packing dry; abdominal wound dressing left abdomen dry; multiple drainage catheters in place left & right; RLQ colostomy. Bowel sounds present; abdomen soft. Impression: 1. Idiopathic thrombocytopenia:  Finally improving. We will continue to monitor daily. If this was medication related, we should be able to stop weekly N-Plate injections if counts continue to improve. Remove nasal packing for her comfort. 2. Bowel stricture/bowel necrosis/ S/P sigmoid colectomy ileostomy 5/14 3. Radiation proctitis. 4. Hemorrhage from rectum due to #1 &3 now subsiding 5.Epistaxis due to #1. 6. Complex, intra-abdominal/pelvic abscess formation S/P percutaneous drainage procedure. Being monitored closely by general surgery.

## 2012-03-02 NOTE — Progress Notes (Signed)
Subjective: No significant bleeding over the weekend. No significant facial pain.  Objective: Vital signs in last 24 hours: Temp:  [98.2 F (36.8 C)-99.3 F (37.4 C)] 98.6 F (37 C) (06/10 1135) Pulse Rate:  [96-114] 114  (06/10 1100) Resp:  [11-23] 23  (06/10 1100) BP: (111-139)/(73-85) 139/85 mmHg (06/10 1100) SpO2:  [99 %-100 %] 100 % (06/10 1100)  Physical Exam  Constitutional: Well-developed and well-nourished. No distress.  Head: Normocephalic and atraumatic.  Ears: External ears normal.  Nose: Left nasal packing removed. Septum midline.  No bleeding Mouth/Throat: Oropharynx without blood  Eyes: Conjunctivae normal. Pupils are equal, round, and reactive to light.  Neck: Normal range of motion. Neck supple.    Basename 03/02/12 0445 03/01/12 0442  WBC 12.8* 10.9*  HGB 6.9* 8.2*  HCT 22.1* 24.3*  PLT 85* 56*    Basename 03/02/12 0445 03/01/12 1625  NA 128* 130*  K 4.9 5.3*  CL 97 99  CO2 25 27  GLUCOSE 706* 526*  BUN 10 11  CREATININE 0.40* 0.40*  CALCIUM 9.4 9.6    Medications:  I have reviewed the patient's current medications. Scheduled:   . alteplase  2 mg Intracatheter Once  . barrier cream  1 application Topical TID  . folic acid  1 mg Intravenous Daily  . HYDROmorphone PCA 0.3 mg/mL   Intravenous Q4H  . metoCLOPramide (REGLAN) injection  5 mg Intravenous Q6H  . oxymetazoline  2 spray Each Nare BID  . prochlorperazine  5 mg Intravenous QID  . sodium chloride  10-40 mL Intracatheter Q12H  . DISCONTD: insulin aspart  0-9 Units Subcutaneous Q4H  . DISCONTD: sodium chloride 0.9 % 50 mL with aminocaproic acid (AMICAR) 1 g infusion   Intravenous Q6H   BJY:NWGNFA chloride, acetaminophen, diphenhydrAMINE, diphenhydrAMINE, hydrALAZINE, HYDROmorphone (DILAUDID) injection, LORazepam, naloxone, ondansetron (ZOFRAN) IV, ondansetron (ZOFRAN) IV, ondansetron, promethazine, sodium chloride, sodium chloride, zolpidem  Assessment/Plan: Thrombocytopenia improved. No  more epistaxis.  Packing removed. Will signoff for now.   LOS: 35 days   Gordon Carlson,SUI W 03/02/2012, 12:32 PM

## 2012-03-03 DIAGNOSIS — R109 Unspecified abdominal pain: Secondary | ICD-10-CM

## 2012-03-03 LAB — PHOSPHORUS: Phosphorus: 4.1 mg/dL (ref 2.3–4.6)

## 2012-03-03 LAB — BASIC METABOLIC PANEL
BUN: 10 mg/dL (ref 6–23)
Calcium: 10.1 mg/dL (ref 8.4–10.5)
Calcium: 10 mg/dL (ref 8.4–10.5)
Creatinine, Ser: 0.34 mg/dL — ABNORMAL LOW (ref 0.50–1.10)
GFR calc Af Amer: >90 mL/min (ref >90)
GFR calc Af Amer: >90 mL/min (ref >90)
GFR calc non Af Amer: >90 mL/min (ref >90)
GFR calc non Af Amer: >90 mL/min (ref >90)
Glucose, Bld: 85 mg/dL (ref 70–99)
Potassium: 3.8 mEq/L (ref 3.5–5.1)
Sodium: 130 mEq/L — ABNORMAL LOW (ref 135–145)
Sodium: 131 mEq/L — ABNORMAL LOW (ref 135–145)

## 2012-03-03 LAB — TYPE AND SCREEN: Antibody Screen: NEGATIVE

## 2012-03-03 LAB — CBC
HCT: 26.7 % — ABNORMAL LOW (ref 36.0–46.0)
MCHC: 34.8 g/dL (ref 30.0–36.0)
MCV: 91.4 fL (ref 78.0–100.0)
Platelets: 109 10*3/uL — ABNORMAL LOW (ref 150–400)
RDW: 21.6 % — ABNORMAL HIGH (ref 11.5–15.5)
WBC: 12.8 10*3/uL — ABNORMAL HIGH (ref 4.0–10.5)

## 2012-03-03 LAB — DIFFERENTIAL
Basophils Absolute: 0.1 10*3/uL (ref 0.0–0.1)
Basophils Relative: 1 % (ref 0–1)
Eosinophils Relative: 1 % (ref 0–5)
Monocytes Absolute: 2.8 10*3/uL — ABNORMAL HIGH (ref 0.1–1.0)

## 2012-03-03 LAB — GLUCOSE, CAPILLARY
Glucose-Capillary: 107 mg/dL — ABNORMAL HIGH (ref 70–99)
Glucose-Capillary: 110 mg/dL — ABNORMAL HIGH (ref 70–99)
Glucose-Capillary: 113 mg/dL — ABNORMAL HIGH (ref 70–99)

## 2012-03-03 MED ORDER — METOCLOPRAMIDE HCL 5 MG/ML IJ SOLN
10.0000 mg | Freq: Three times a day (TID) | INTRAMUSCULAR | Status: DC
  Administered 2012-03-03 – 2012-03-11 (×24): 10 mg via INTRAVENOUS
  Filled 2012-03-03 (×27): qty 2

## 2012-03-03 MED ORDER — SODIUM CHLORIDE 0.9 % IJ SOLN
INTRAMUSCULAR | Status: AC
  Filled 2012-03-03: qty 20

## 2012-03-03 MED ORDER — CLINIMIX E/DEXTROSE (5/20) 5 % IV SOLN
INTRAVENOUS | Status: AC
  Administered 2012-03-03: 18:00:00 via INTRAVENOUS
  Filled 2012-03-03: qty 2000

## 2012-03-03 MED ORDER — MAGNESIUM SULFATE 40 MG/ML IJ SOLN
2.0000 g | Freq: Once | INTRAMUSCULAR | Status: AC
  Administered 2012-03-03: 2 g via INTRAVENOUS
  Filled 2012-03-03: qty 50

## 2012-03-03 MED ORDER — METRONIDAZOLE IN NACL 5-0.79 MG/ML-% IV SOLN
500.0000 mg | Freq: Three times a day (TID) | INTRAVENOUS | Status: DC
  Administered 2012-03-03 – 2012-03-12 (×27): 500 mg via INTRAVENOUS
  Filled 2012-03-03 (×32): qty 100

## 2012-03-03 MED ORDER — CIPROFLOXACIN IN D5W 400 MG/200ML IV SOLN
400.0000 mg | Freq: Two times a day (BID) | INTRAVENOUS | Status: DC
  Administered 2012-03-03 – 2012-03-11 (×18): 400 mg via INTRAVENOUS
  Filled 2012-03-03 (×20): qty 200

## 2012-03-03 NOTE — Progress Notes (Signed)
28 Days Post-Op  Subjective: Much less abdominal pain, ileostomy functioning   Objective: Vital signs in last 24 hours: Temp:  [98.4 F (36.9 C)-99.9 F (37.7 C)] 99 F (37.2 C) (06/11 0800) Pulse Rate:  [101-114] 103  (06/11 0700) Resp:  [12-23] 20  (06/11 0800) BP: (115-139)/(75-89) 117/78 mmHg (06/11 0700) SpO2:  [98 %-100 %] 99 % (06/11 0800) Last BM Date: 03/02/12  Intake/Output from previous day: 06/10 0701 - 06/11 0700 In: 2971.3 [I.V.:736.3; Blood:350; NG/GT:60; TPN:1680] Out: 4612 [Urine:2350; Emesis/NG output:1900; Drains:362] Intake/Output this shift:    General appearance: alert and cooperative Resp: clear to auscultation bilaterally Cardio: regular rate and rhythm GI: soft. minimal LLQ tenderness, few BS, ileostomy pink - bag just emptied, wound with clean granulation tissue EXT: calves soft  Lab Results:   Basename 03/03/12 0458 03/02/12 1600  WBC 12.8* 13.6*  HGB 9.3* 8.7*  HCT 26.7* 26.3*  PLT 109* 101*   BMET  Basename 03/03/12 0458 03/02/12 1600  NA 130* 131*  K 3.8 3.8  CL 96 97  CO2 28 27  GLUCOSE 85 86  BUN 10 11  CREATININE 0.35* 0.37*  CALCIUM 10.1 10.1   PT/INR No results found for this basename: LABPROT:2,INR:2 in the last 72 hours ABG No results found for this basename: PHART:2,PCO2:2,PO2:2,HCO3:2 in the last 72 hours  Studies/Results: No results found.  Anti-infectives: Anti-infectives     Start     Dose/Rate Route Frequency Ordered Stop   02/24/12 1600   levofloxacin (LEVAQUIN) IVPB 750 mg  Status:  Discontinued        750 mg 100 mL/hr over 90 Minutes Intravenous Every 24 hours 02/24/12 1526 02/26/12 1012   02/20/12 1415   cefTRIAXone (ROCEPHIN) 1 g in dextrose 5 % 50 mL IVPB  Status:  Discontinued        1 g 100 mL/hr over 30 Minutes Intravenous Every 24 hours 02/20/12 1404 02/24/12 1526   02/19/12 1800   ciprofloxacin (CIPRO) IVPB 400 mg  Status:  Discontinued        400 mg 200 mL/hr over 60 Minutes Intravenous Every  12 hours 02/19/12 1612 02/20/12 1404   02/19/12 1700   metroNIDAZOLE (FLAGYL) IVPB 500 mg  Status:  Discontinued        500 mg 100 mL/hr over 60 Minutes Intravenous 3 times per day 02/19/12 1612 02/26/12 1012   02/12/12 0800   piperacillin-tazobactam (ZOSYN) IVPB 3.375 g  Status:  Discontinued        3.375 g 12.5 mL/hr over 240 Minutes Intravenous 3 times per day 02/12/12 0800 02/19/12 1612   02/10/12 1000   amoxicillin-clavulanate (AUGMENTIN) 875-125 MG per tablet 1 tablet  Status:  Discontinued        1 tablet Oral Every 12 hours 02/10/12 0823 02/12/12 0800   02/04/12 1800   cefOXitin (MEFOXIN) 1 g in dextrose 5 % 50 mL IVPB        1 g 100 mL/hr over 30 Minutes Intravenous Every 6 hours 02/04/12 1621 02/05/12 0617   02/04/12 0000   cefOXitin (MEFOXIN) 2 g in dextrose 5 % 50 mL IVPB        2 g 100 mL/hr over 30 Minutes Intravenous 60 min pre-op 02/03/12 0333 02/04/12 1145   02/03/12 0500   cefOXitin (MEFOXIN) 2 g in dextrose 5 % 50 mL IVPB  Status:  Discontinued        2 g 100 mL/hr over 30 Minutes Intravenous 60 min pre-op 02/02/12 1156 02/03/12 0333  02/03/12 0000   cefOXitin (MEFOXIN) 2 g in dextrose 5 % 50 mL IVPB  Status:  Discontinued        2 g 100 mL/hr over 30 Minutes Intravenous 60 min pre-op 02/02/12 1004 02/02/12 1156   01/27/12 2200   cefTRIAXone (ROCEPHIN) 1 g in dextrose 5 % 50 mL IVPB        1 g 100 mL/hr over 30 Minutes Intravenous Every 24 hours 01/27/12 2152 01/29/12 2359   01/27/12 1915   cefTRIAXone (ROCEPHIN) 1 g in dextrose 5 % 50 mL IVPB        1 g 100 mL/hr over 30 Minutes Intravenous  Once 01/27/12 1914 01/27/12 1957          Assessment/Plan: s/p Procedure(s) (LRB): COLON RESECTION SIGMOID (N/A) S/P sigmoid colectomy and ileostomy with anastamotic leak Abscesses - drain repositioned and new drain placed by IR last Friday, off ABX due to thrombocytopenia, continue drain care ID - off ABX as above, appreciate ID F/U Thrombocytopenia - likely  med re;lated, significant improvement, appreciate Hematology F/U Anemia ABL - better S/P [facility] yesterday Ileus - significant OGT output (1900), continue for now.  Patient is sad about this but I spoke to her about it. FEN - TNA, Na slightly better with IVF at [facility] Overall significant improvement since Friday.  No need for further surgery at this point. Transfer to 3300  LOS: 36 days    Vanessa [name] 03/03/2012

## 2012-03-03 NOTE — Progress Notes (Signed)
28 Days Post-Op  Subjective: Cervical ca; post op abscesses Drains x 4 Pt actually seems better today Up in bed; smiling despite OG  Objective: Vital signs in last 24 hours: Temp:  [98.4 F (36.9 C)-99.9 F (37.7 C)] 99 F (37.2 C) (06/11 0800) Pulse Rate:  [101-114] 103  (06/11 0700) Resp:  [12-23] 20  (06/11 0800) BP: (115-139)/(75-89) 117/78 mmHg (06/11 0700) SpO2:  [98 %-100 %] 99 % (06/11 0800) Last BM Date: 03/02/12  Intake/Output from previous day: 06/10 0701 - 06/11 0700 In: 2971.3 [I.V.:736.3; Blood:350; NG/GT:60; TPN:1680] Out: 4612 [Urine:2350; Emesis/NG output:1900; Drains:362] Intake/Output this shift:    PE:  All drains intact; new dressings applied; all clean and dry LLQ with most ouput, still >380mL/day Other drains with scant output plts 109 today Pt in better spirits Wbc 12.8  Lab Results:   Basename 03/03/12 0458 03/02/12 1600  WBC 12.8* 13.6*  HGB 9.3* 8.7*  HCT 26.7* 26.3*  PLT 109* 101*   BMET  Basename 03/03/12 0458 03/02/12 1600  NA 130* 131*  K 3.8 3.8  CL 96 97  CO2 28 27  GLUCOSE 85 86  BUN 10 11  CREATININE 0.35* 0.37*  CALCIUM 10.1 10.1   PT/INR No results found for this basename: LABPROT:2,INR:2 in the last 72 hours ABG No results found for this basename: PHART:2,PCO2:2,PO2:2,HCO3:2 in the last 72 hours  Studies/Results: No results found.  Anti-infectives:   Assessment/Plan: s/p Procedure(s) (LRB): COLON RESECTION SIGMOID (N/A)  Post op abd abscesses Drains x4 placed in IR Pt looks clinically better Afeb; plts rising   Vanessa [name] 03/03/2012

## 2012-03-03 NOTE — Progress Notes (Signed)
Name: Vanessa [name] MRN: [id number] DOB: 03/29/1976    LOS: 36  Referring Provider:  CCS  Reason for Referral:  Thrombocytopenia    PULMONARY / CRITICAL CARE MEDICINE  Brief patient description:  36 y/o F with Hx of cervical cancer s/p chemo / radiation (2012), admitted 5/6 with abd pain, n/v now with abdominal adhesions / abscess and profound thrombocytopenia -not responsive to infusion.  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  Fixed sigmoid with sharp angulation 5/14 Colon resection with sigmoid / diverting loop ileostomy 5/23 Perc Drain by IR of abd abscesses 5/31 PICC line placed Received PRBC 6/2, 6/3, 6/4, 6/5, 6/6, 6/7 ~3000cc BRBPR 6/5  Subjective/Interval events:  Neg  Balance on own  Vital Signs: Temp:  [98.4 F (36.9 C)-99.9 F (37.7 C)] 99 F (37.2 C) (06/11 0800) Pulse Rate:  [101-114] 103  (06/11 0700) Resp:  [12-23] 20  (06/11 0800) BP: (115-139)/(75-89) 117/78 mmHg (06/11 0700) SpO2:  [98 %-100 %] 99 % (06/11 0800)   Intake/Output Summary (Last 24 hours) at 03/03/12 1044 Last data filed at 03/03/12 0700  Gross per 24 hour  Intake 2586.34 ml  Output   3637 ml  Net -1050.66 ml    Physical Examination: General:  Chronically ill, appears older than age Neuro: follows commands Cardiovascular:  s1s2 rrr, no m/r/g Lungs:  CTA Abdomen:  Round / soft, tender,  3 JP drains, RLQ ileostomy, NG tube with large output remains Musculoskeletal:  No obvious deformities Skin:  Warm/dry  Principal Problem:  *Sigmoid stricture Active Problems:  Cervical cancer  Post-radiation rectal bleeding  Pyelonephritis  Nausea & vomiting  Hypercalcemia  Radiation colitis  Hemorrhage of rectum and anus  Acute posthemorrhagic anemia  Thrombocytopenia   ASSESSMENT AND PLAN  PULMONARY  CXR:  none ETT:  none  A:   At risk ATX P:   -at risk ATX -IS  CARDIOVASCULAR  ECG:    Lines:  RUE PICC 5/31>>>  A:  Hypertension- improved P:  -she is allergic to beta blockers, PRN hydralazine  -continue tele -controlling pain  RENAL  Lab 03/03/12 0458 03/02/12 1600 03/02/12 0445 03/01/12 1625 03/01/12 0442 02/28/12 0600 02/27/12 0500  NA 130* 131* 128* 130* 132* -- --  K 3.8 3.8 -- -- -- -- --  CL 96 97 97 99 100 -- --  CO2 28 27 25 27 27  -- --  BUN 10 11 10 11 12  -- --  CREATININE 0.35* 0.37* 0.40* 0.40* 0.29* -- --  CALCIUM 10.1 10.1 9.4 9.6 10.0 -- --  MG 1.6 -- 1.9 -- 1.7 1.6 1.4*  PHOS 4.1 -- 5.0* -- 3.0 3.0 2.2*   Intake/Output      06/10 0701 - 06/11 0700 06/11 0701 - 06/12 0700   I.V. (mL/kg) 736.3 (9.9)    Blood 350    Other 145    NG/GT 60    IV Piggyback     TPN 1680    Total Intake(mL/kg) 2971.3 (39.9)    Urine (mL/kg/hr) 2350 (1.3)    Emesis/NG output 1900    Drains 362    Stool     Total Output 4612    Net -1640.7          Foley:    A:   Hyponatremia Hypophosphatemia, corrcted hypomag  P:   -TNA per Pharmacy, follow Na, low threshold lasix -f/u chem in am   GASTROINTESTINAL  Lab 03/02/12 0445  02/27/12 0500  AST 18 12  ALT 19 11  ALKPHOS 99 47  BILITOT 0.6 0.4  PROT 5.4* 4.6*  ALBUMIN 1.6* 1.8*   CT ABD/Pelvis 6/7>>>Multiple persistent abscess collections in the upper abdomen and pelvis, including persistent subcapsular collections at the liver  and spleen. Persistent rectal wall thickening.  Mild degree of wall of small bowel distention could reflect a  partial degree of obstruction or ileus.   A:   Abdominal Pain. S/P sigmoid colectomy with primary anastomosis and diverting ileostomy. Probable anastomotic leak, multiple inta abd abscesses.  S/p new drain placement in IR 6/8 now with total 4 drains.  P: -pain control  -Recs per CCS - no surgery recommended currently, continue draining -WOC for ileostomy  A: ? Ileus/nausea/vomiting P: -Zofran, phenergan PRN  -Reglan added 6/8, increase to q8h 10 mg  -Keep  NPO -can we limit Dilaudid which would help issue  Rectal bleeding- continues to have large amt of BRB mixed with large clots - resolving 6/8, 9,10 P:   -continue to monitor -f/u CBC -NO indication further Hope for further rhemoconcetration  HEMATOLOGIC  Lab 03/03/12 0458 03/02/12 1600 03/02/12 0445 03/01/12 0442 02/29/12 1800 02/28/12 0600 02/26/12 0400  HGB 9.3* 8.7* 6.9* 8.2* 6.7* -- --  HCT 26.7* 26.3* 22.1* 24.3* 20.4* -- --  PLT 109* 101* 85* 56* 38* -- --  INR -- -- -- -- -- 1.33 1.39  APTT -- -- -- -- -- 32 28   Hematology panel 6/1: no schitocytes, haptoglogin 154, fibrinogen 410  A:   Refractory Thrombocytopenia, suspect d/t consumption vs. Drug related (? Cephlosporin).  Cannot r/o antibody induced drop. S/p chemo / radiation for cervical cancer. Last treatment in 10/2010. Appreciate Dr Precious Reel input - no evidence for TTP, HITT, or DIC. 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 Suspect due to meds, but all possible culprits have been stopped. Has failed Amicar, IVIg, and steroid treatment.   **plt trending up 6/9.  Off abx x 4 days ? abx as culprit.   P: -improved plat daily Continue to drain infection and follow trend ENT removed packing 6/10- no bleeding  Anemia -secondary to bleeding from nose and rectum, 2 u PRBCs 6/3, 6/6, 6/8.  Cont to have ?worsenig rectal bleeding.  P: -avoid further Tx  INFECTIOUS  Lab 03/03/12 0458 03/02/12 1600 03/02/12 0445 03/01/12 0442 02/29/12 1800  WBC 12.8* 13.6* 12.8* 10.9* 11.2*  PROCALCITON -- -- -- <0.10 --   Cultures: UC 5/6>>>neg 5/13 MRSA PCR>>>neg 5/23 Abd abscess drain>>>multiple organisims 5/23 Abd drainage>>>few e-Coli 5/23 RU abd drain>>>rare viridans, abundant bacteroides fragilis, beta lactam positive 6/2 HIV>>> neg  Antibiotics: Augmentin 5/20>>5/22 Cefoxitin 5/13>>>5/15 Rocephin 5/6>>>5/8, 5/30>>>6/3 Cipro 5/29>>>5/30 Flagyl 5/29>>>6/5 Zosyn 5/22>>>5/29 Levaquin 6/3>>>6/5  A:    Abdominal Abscess - IR drains in place P:   -cipro added per ID, plat trending upward Drain sin place  ENDOCRINE  Lab 03/01/12 2016 03/01/12 1622 03/01/12 1212 03/01/12 0826 03/01/12 0415  GLUCAP 110* 95 103* 124* 128*   A:   Hyperglycemia- well controled  P:   -monitor CBG glucose -Continue SSI  NEUROLOGIC  A:  Anxiety  P:   -supportive care -PRN ativan  Mobilize when able, PT consulted 6/10  BEST PRACTICE / DISPOSITION Level of Care:  ICU--> agree to sdu Primary Service:  CCS Consultants:  PCCM, Hematology, GI Code Status:  Full Diet:  Clear DVT Px:  scd's GI Px:  pepcid Skin Integrity:  intact Social /  Family:   Mcarthur Rossetti. Tyson Alias, MD, FACP Pgr: [phone] [facility]

## 2012-03-03 NOTE — Progress Notes (Addendum)
INFECTIOUS DISEASE PROGRESS NOTE  ID: Vanessa [name] is a 36 y.o. female with complex medical history of cervical CA, s/p XRT in Jan 2012 s/p colectomy and divertin ileostomy ion 02/04/12 POD #27 complicated with multiple abdominal abscesses status post bowel surgery placed on broad spectrum abtx for polymicrobial peritoneal abscess complicated by bleeding and thrombocytopenia, off of antibiotics since 6/4, peritoneal drain repositioned on 6/7  Subjective: Remains afebrile,  abdominal pain controlled with pca. She denies fevers, chills, nightsweats nor spontaneously bleeding  24hr: wbc still elevated at12.8 ; plt increased from   85 to 109K;   Abtx:  Off of abtx since 6/4  Ceftriaxone d/c'd 6/3 Metronidazole d/c'd 6/4 Levo on 6/3 and 6/4  Medications:     . alteplase  2 mg Intracatheter Once  . antiseptic oral rinse  15 mL Mouth Rinse QID  . barrier cream  1 application Topical TID  . chlorhexidine  15 mL Mouth/Throat BID  . folic acid  1 mg Intravenous Daily  . HYDROmorphone PCA 0.3 mg/mL   Intravenous Q4H  . metoCLOPramide (REGLAN) injection  5 mg Intravenous Q6H  . prochlorperazine  5 mg Intravenous QID  . sodium chloride  10-40 mL Intracatheter Q12H  . white petrolatum      . DISCONTD: oxymetazoline  2 spray Each Nare BID  . DISCONTD: sodium chloride 0.9 % 50 mL with aminocaproic acid (AMICAR) 1 g infusion   Intravenous Q6H    Objective: Vital signs in last 24 hours: Temp:  [98.4 F (36.9 C)-99.9 F (37.7 C)] 99 F (37.2 C) (06/11 0800) Pulse Rate:  [101-114] 103  (06/11 0700) Resp:  [12-23] 20  (06/11 0800) BP: (115-139)/(75-89) 117/78 mmHg (06/11 0700) SpO2:  [98 %-100 %] 99 % (06/11 0800) General appearance: alert, in no distress and appears fatigued, chronically ill, strong necrotic smell  HEENt= og in place to decompress stomach. Resp: clear to auscultation bilaterally  Cardio: Tachy, RR  GI: + drain in place, staples c/d/i,  Extremities: extremities  normal, atraumatic, no cyanosis or edema      Lab Results  Basename 03/03/12 0458 03/02/12 1600  WBC 12.8* 13.6*  HGB 9.3* 8.7*  HCT 26.7* 26.3*  NA 130* 131*  K 3.8 3.8  CL 96 97  CO2 28 27  BUN 10 11  CREATININE 0.35* 0.37*  GLU -- --   Liver Panel  Basename 03/02/12 0445  PROT 5.4*  ALBUMIN 1.6*  AST 18  ALT 19  ALKPHOS 99  BILITOT 0.6  BILIDIR --  IBILI --    Microbiology: Recent Results (from the past 240 hour(s))  CULTURE, ROUTINE-ABSCESS     Status: Normal   Collection Time   02/28/12  3:52 PM      Component Value Range Status Comment   Specimen Description ABSCESS PERITONEAL CAVITY   Final    Special Requests LEFT UPPER QUADRANT ABSCESS,YELLOW PURULENT FLUID   Final    Gram Stain     Final    Value: ABUNDANT WBC PRESENT,BOTH PMN AND MONONUCLEAR     NO SQUAMOUS EPITHELIAL CELLS SEEN     ABUNDANT GRAM POSITIVE COCCI IN PAIRS     MODERATE GRAM NEGATIVE COCCOBACILLI   Culture     Final    Value: MODERATE ESCHERICHIA COLI     ABUNDANT VIRIDANS STREPTOCOCCUS   Report Status 03/02/2012 FINAL   Final    Organism ID, Bacteria ESCHERICHIA COLI   Final      Assessment/Plan: 36yo F with hx of  cervical Ca, XRT with numerous pelvic adhesions s/p colectomy and diverting ileostomy in 02/04/12 complicated by multiple peritoneal abscess( polymicrobial including pansensitive strep), c/b bleeding and thrombocytopenia likely from betalactams. Currently off of antibiotics, normotensive, no fevers, but leukocytosis of 12.8 increased from 10.9  1) still has leukocytosis and new IR drain and culture shows polymicrobial bacteria from intraperitoneal abscess, including pan sensitive ecoli. will need to restart ciprofloxacin and metronidazole +/- re-image in a few weeks to ensure there is appropriate drainage of abscess.  2) thrombocytopenia improving but not back to baseline. Will refrain from using beta lactams  [facility] 03/03/2012, 9:53  AM

## 2012-03-03 NOTE — Progress Notes (Signed)
  03/03/2012, 7:45 AM  Hospital day: 37 Antibiotics: none IVIG 6-1, 6-2 and 6-6,6-7,6-8 Nplate 6-1 and 6-8 Tranexemic acid 6-3, amicar 6-4 thru 6-9 Total 6 days IV steroids, last 6-9 IV folate continues  Subjective:  Awake, alert, looks much better. Very uncomfortable from NG, with 1900 out yesterday. Only pain is "pressure" in low abdomen when voids. No epistaxis or rectal bleeding now. Odors aggravating nausea.  Objective: Vital signs in last 24 hours: Blood pressure 117/78, pulse 103, temperature 99.1 F (37.3 C), temperature source Oral, resp. rate 16, height 5\' 4"  (1.626 m), weight 164 lb 3.2 oz (74.481 kg), SpO2 99.00%. PERRL. Nasal packing out. NG in mouth to suction with clear greenish fluid, no blood. Mouth moist, no clear thrush. Lungs clear anteriorly. Heart tachy, regular. Abdomen soft, not tender to gentle palpation. Dark slightly formed stool in ostomy. No bleeding at lines. LE with SCD on, feet warm without edema. Moves all extremities.  Intake/Output from previous day: 06/10 0701 - 06/11 0700 In: 2971.3 [I.V.:736.3; Blood:350; NG/GT:60; TPN:1680] Out: 4612 [Urine:2350; Emesis/NG output:1900; Drains:362] Intake/Output this shift:     Lab Results:  Basename 03/03/12 0458 03/02/12 1600  WBC 12.8* 13.6*  HGB 9.3* 8.7*  HCT 26.7* 26.3*  PLT 109* 101*   BMET  Basename 03/03/12 0458 03/02/12 1600  NA 130* 131*  K 3.8 3.8  CL 96 97  CO2 28 27  GLUCOSE 85 86  BUN 10 11  CREATININE 0.35* 0.37*  CALCIUM 10.1 10.1    Studies/Results: No results found.   Assessment/Plan: 1.Thrombocytopenia: count stable today just over 100k without bleeding.  Have DCd afrin spray and nasal packing out yesterday. ID recommends cipro/ flagyl if needs to resume antibiotics; WBC no higher today.  HIT was negative, presently using SCDs. 2. Bowel stricture/ necrosis post sigmoid resection, with multiple intraabdominal absesses, drains in by IR. 3.History of cervical cancer: no  documented recurrence, with path from bowel resection negative for malignancy. Some findings on CT AP 6-6 not clearly recurrent cancer. 4.continuing TNA  Vanessa [name] P

## 2012-03-03 NOTE — Progress Notes (Signed)
Chaplain Note:  Chaplain visited with pt.  Pt was in bed, awake, and alert.  Pt was positive about her progress and looking forward to transferring out of [facility].   Pt continues to hold family up as motivation for recovery. Chaplain provided spiritual comfort, support, and prayer.  Chaplain will follow up as needed.  03/03/12 1500  Clinical Encounter Type  Visited With Patient  Visit Type Spiritual support;Follow-up  Referral From Other (Comment) (Purl Claytor-referral)  Spiritual Encounters  Spiritual Needs Emotional;Prayer  Stress Factors  Patient Stress Factors Major life changes;Health changes  Family Stress Factors None identified (family not present)   Verdie Shire, chaplain resident [phone]

## 2012-03-03 NOTE — Progress Notes (Signed)
PT Cancellation Note  Treatment cancelled today due to patient's refusal to participate.  "I am tired."  Patient adamantly refused.  Thanks.  INGOLD,Rachelanne Whidby 03/03/2012, 3:49 PM  [facility] [phone] (pager)

## 2012-03-03 NOTE — Progress Notes (Signed)
UR Completed.  Vanessa [name] [phone] 03/03/2012  

## 2012-03-03 NOTE — Progress Notes (Signed)
PARENTERAL NUTRITION CONSULT NOTE - FOLLOW UP  Pharmacy Consult for TPN Indication: intraabdominal abscess, ileus  Allergies  Allergen Reactions  . Labetalol Hcl Itching and Other (See Comments)    Bumps to bilateral arms.  . Morphine And Related Hives   Patient Measurements: Height: 5\' 4"  (162.6 cm) Weight: 164 lb 3.2 oz (74.481 kg) IBW/kg (Calculated) : 54.7   Vital Signs: Temp: 99.1 F (37.3 C) (06/11 0343) Temp src: Oral (06/11 0343) BP: 117/78 mmHg (06/11 0700) Pulse Rate: 103  (06/11 0700) Intake/Output from previous day: 06/10 0701 - 06/11 0700 In: 2971.3 [I.V.:736.3; Blood:350; NG/GT:60; TPN:1680] Out: 4612 [Urine:2350; Emesis/NG output:1900; Drains:362] Intake/Output from this shift:  Labs:  [facility] 03/03/12 0458 03/02/12 1600 03/02/12 0445  WBC 12.8* 13.6* 12.8*  HGB 9.3* 8.7* 6.9*  HCT 26.7* 26.3* 22.1*  PLT 109* 101* 85*  APTT -- -- --  INR -- -- --    Basename 03/03/12 0458 03/02/12 1600 03/02/12 0445 03/01/12 0442  NA 130* 131* 128* --  K 3.8 3.8 4.9 --  CL 96 97 97 --  CO2 28 27 25  --  GLUCOSE 85 86 706* --  BUN 10 11 10  --  CREATININE 0.35* 0.37* 0.40* --  LABCREA -- -- -- --  CREAT24HRUR -- -- -- --  CALCIUM 10.1 10.1 9.4 --  MG 1.6 -- 1.9 1.7  PHOS 4.1 -- 5.0* 3.0  PROT -- -- 5.4* --  ALBUMIN -- -- 1.6* --  AST -- -- 18 --  ALT -- -- 19 --  ALKPHOS -- -- 99 --  BILITOT -- -- 0.6 --  BILIDIR -- -- -- --  IBILI -- -- -- --  PREALBUMIN -- -- 21.5 --  TRIG -- -- 135 --  CHOLHDL -- -- -- --  CHOL -- -- 75 --   Estimated Creatinine Clearance: 96.1 ml/min (by C-G formula based on Cr of 0.35).   Basename 03/01/12 2016 03/01/12 1622 03/01/12 1212  GLUCAP 110* 95 103*   Insulin Requirements in the past 24 hours:  SSI, CBG checks d/c'd 6/10 d/t minimal SSI needs.  NO insulin in TNA    Current Nutrition:  - Clinimix E5/20 at 70 ml/hr (at goal) - Avoiding lipids due to possible contribution to thrombocytopenia.   - NPO  Nutritional  Goals:  1800-2000 kCal, 80-95 grams of protein per day.    Clinimix E5/20 goal rate of 35ml/hr will provide an average of 1478 kcal and 84g protein per day based on RD assessment (82% total kcal and 100% protein needs)  Assessment: 53 yof with a history of cervical cancer who is s/p sigmoid colectomy and diverting ileostomy on 5/14. Developed multiple intra-abdominal abscesses d/t anastomotic leak and had 3 perc drains placed. On 5/29 patient had multiple blood BM, hemoptysis, and abdominal pain. Plts dropped to <5 (240k on 5/25) but have now improved to 109 . CT 5/30 showed additional abscesses and concern for fistula formation.  New periotoneal drain placed by IR 6/7, also has OGT with significant output.    GI:  CT scan abdomen and pelvis 6/6 - Continues to show persistent abscess collections in the upper abd/pelvis. New perioteneal drain placed 6/7.  OR plans deferred d/t significant improvement since 6/7. High volume emesis/NGT output-scheduled reglan continues. Off PPI and H2RA d/t thrombocytopenia risk. Doubt any PO absorption.  Endo: No hx DM. CBG controlled.  Isolated BG 706 on 6/9 contaminated from TPN line. Minimal SSI requirement.   Lytes:  Na low-? Fluid o/load. IVF  at [facility]. Mag decreased to 1.6.  Corr Ca 12.02. Other lytes ok.  Acid-base status ok.  Renal: SCr good, UOP 1.3 ml/kg/hr.    Pulm: RA  Cards:Hemodynamically stable  Hepatobil: Alk phos/LFTs/bili wnl. Albumin remains low.  Triglycerides and cholesterol WNL, prealbumin trending up, WNL  Neuro: standing po morphine BID and dilaudid PCA   Heme/Onc: Persistent thrombocytopenia improving with steroids, IVIG, N-Plate, discontinuation of multiple offending meds. Nasal and rectal bleeding decreased, but still needing transfusions for low Hgb; she received tranexamic acid (6/3) and is receiving amicar. Noted several blood product transfusions to date.    ID: Pt with known peritoneal infection now off all Abx (CCM stopped all  medications that could induce thrombocytopenia); WBC elevated, stable; afebrile. ID following, will restart abx if WBC trends up +/- reimage to ensure appropriate drainage of abscess  Best Practices: SCDs  Plan:  - Continue Clinimix E 5/20 at goal of 70 ml/hr  - Magnesium bolus 2gm IV x 1 - MVI and Trace elements MWF only d/t national shortage - Fat emulsion omitted d/t to chance of worsening thrombocytopenia. Risk of EFAD if fats completely avoided for 3-4 weeks- consider resuming IV fat emulsion the week of 6/24 if TPN continues. - f/u AM labs  Haynes Hoehn, PharmD 03/03/2012 8:07 AM  Pager: [phone]

## 2012-03-03 NOTE — Consult Note (Signed)
WOC ostomy follow-up  consult  Stoma type/location: Right upper quadrant ileostomy Stomal assessment/size: Red and moist, oval shape, raised above skin.   Output:  Not observed, no stool in bad.  Patient admits to changing ostomy bag recently.  Denies any blood in stool recently. Ostomy pouching: 2pc. Pouch   Education provided: Patient expressed that she was aware of how to change ostomy bag and recently done so this morning without assistance.  Discussed that surgery may not be necessary for abscess as long as condition remains stable and drains continue to have output.  Supplies at bedside for patient use.  No further teaching sessions necessary.    Nefeteria Jeter RN, BSN, WOC Nurse/Will not plan to follow further unless re-consulted.  [address] RN, MSN, [employer]  [phone]

## 2012-03-04 DIAGNOSIS — K651 Peritoneal abscess: Secondary | ICD-10-CM

## 2012-03-04 LAB — BASIC METABOLIC PANEL
BUN: 9 mg/dL (ref 6–23)
BUN: 9 mg/dL (ref 6–23)
Calcium: 10.3 mg/dL (ref 8.4–10.5)
Creatinine, Ser: 0.35 mg/dL — ABNORMAL LOW (ref 0.50–1.10)
GFR calc Af Amer: >90 mL/min (ref >90)
GFR calc Af Amer: >90 mL/min (ref >90)
GFR calc non Af Amer: >90 mL/min (ref >90)
GFR calc non Af Amer: >90 mL/min (ref >90)
Potassium: 3.5 mEq/L (ref 3.5–5.1)
Potassium: 3.6 mEq/L (ref 3.5–5.1)
Sodium: 133 mEq/L — ABNORMAL LOW (ref 135–145)

## 2012-03-04 LAB — DIFFERENTIAL
Basophils Absolute: 0 10*3/uL (ref 0.0–0.1)
Lymphs Abs: 1.3 10*3/uL (ref 0.7–4.0)
Monocytes Relative: 17 % — ABNORMAL HIGH (ref 3–12)

## 2012-03-04 LAB — CBC
Hemoglobin: 8.4 g/dL — ABNORMAL LOW (ref 12.0–15.0)
MCHC: 33.3 g/dL (ref 30.0–36.0)
RBC: 2.71 MIL/uL — ABNORMAL LOW (ref 3.87–5.11)

## 2012-03-04 LAB — MAGNESIUM: Magnesium: 1.9 mg/dL (ref 1.5–2.5)

## 2012-03-04 LAB — PHOSPHORUS: Phosphorus: 3.4 mg/dL (ref 2.3–4.6)

## 2012-03-04 MED ORDER — SODIUM CHLORIDE 0.9 % IJ SOLN
INTRAMUSCULAR | Status: AC
  Filled 2012-03-04: qty 50

## 2012-03-04 MED ORDER — ZINC TRACE METAL 1 MG/ML IV SOLN
INTRAVENOUS | Status: AC
  Administered 2012-03-04: 18:00:00 via INTRAVENOUS
  Filled 2012-03-04: qty 2000

## 2012-03-04 NOTE — Progress Notes (Signed)
Name: Vanessa [name] MRN: [id number] DOB: Feb 05, 1976    LOS: 37  Referring Provider:  CCS  Reason for Referral:  Thrombocytopenia    PULMONARY / CRITICAL CARE MEDICINE  Brief patient description:  36 y/o F with Hx of cervical cancer s/p chemo / radiation (2012), admitted 5/6 with abd pain, n/v now with abdominal adhesions / abscess and profound thrombocytopenia -not responsive to infusion.  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  Fixed sigmoid with sharp angulation 5/14 Colon resection with sigmoid / diverting loop ileostomy 5/23 Perc Drain by IR of abd abscesses 5/31 PICC line placed Received PRBC 6/2, 6/3, 6/4, 6/5, 6/6, 6/7 ~3000cc BRBPR 6/5  Subjective/Interval events:  Now on SDU, no new issues.  Vital Signs: Temp:  [98.4 F (36.9 C)-99.4 F (37.4 C)] 99 F (37.2 C) (06/12 0755) Pulse Rate:  [93-107] 100  (06/12 0328) Resp:  [14-22] 20  (06/12 0842) BP: (115-138)/(73-92) 122/79 mmHg (06/12 0328) SpO2:  [99 %-100 %] 100 % (06/12 0842) Weight:  [68.176 kg (150 lb 4.8 oz)] 68.176 kg (150 lb 4.8 oz) (06/11 1546)   Intake/Output Summary (Last 24 hours) at 03/04/12 0939 Last data filed at 03/04/12 4098  Gross per 24 hour  Intake    890 ml  Output 3692.5 ml  Net -2802.5 ml    Physical Examination: General:  Chronically ill, appears older than age Neuro: follows commands Cardiovascular:  s1s2 rrr, no m/r/g Lungs:  CTA Abdomen:  Round / soft, tender,  4 JP drains, RLQ ileostomy, NG tube with large output remains Musculoskeletal:  No obvious deformities Skin:  Warm/dry  Principal Problem:  *Sigmoid stricture Active Problems:  Cervical cancer  Post-radiation rectal bleeding  Pyelonephritis  Nausea & vomiting  Hypercalcemia  Radiation colitis  Hemorrhage of rectum and anus  Acute posthemorrhagic anemia  Thrombocytopenia   ASSESSMENT AND PLAN  PULMONARY  CXR:   none ETT:  none  A:   At risk ATX P:   -at risk ATX -IS  CARDIOVASCULAR  ECG:   Lines:  RUE PICC 5/31>>>  A:  Hypertension- improved P:  -she is allergic to beta blockers, PRN hydralazine  -continue tele -controlling pain  RENAL  Lab 03/04/12 0440 03/03/12 1522 03/03/12 0458 03/02/12 1600 03/02/12 0445 03/01/12 0442 02/28/12 0600  NA 133* 131* 130* 131* 128* -- --  K 3.5 3.5 -- -- -- -- --  CL 98 96 96 97 97 -- --  CO2 27 28 28 27 25  -- --  BUN 9 10 10 11 10  -- --  CREATININE 0.41* 0.34* 0.35* 0.37* 0.40* -- --  CALCIUM 10.2 10.0 10.1 10.1 9.4 -- --  MG 1.9 -- 1.6 -- 1.9 1.7 1.6  PHOS 3.4 -- 4.1 -- 5.0* 3.0 3.0   Intake/Output      06/11 0701 - 06/12 0700 06/12 0701 - 06/13 0700   I.V. (mL/kg)     Blood     Other 50    NG/GT 0    TPN 840    Total Intake(mL/kg) 890 (13.1)    Urine (mL/kg/hr) 2050 (1.3)    Emesis/NG output 1500    Drains 85 57.5   Stool 40    Total Output 3675 57.5   Net -2785 -57.5         Foley:    A:   Hyponatremia Hypophosphatemia, corrcted hypomag  P:   -TNA per Pharmacy -f/u chem in am  GASTROINTESTINAL  Lab 03/02/12 0445 02/27/12 0500  AST 18 12  ALT 19 11  ALKPHOS 99 47  BILITOT 0.6 0.4  PROT 5.4* 4.6*  ALBUMIN 1.6* 1.8*   CT ABD/Pelvis 6/7>>>Multiple persistent abscess collections in the upper abdomen and pelvis, including persistent subcapsular collections at the liver  and spleen. Persistent rectal wall thickening.  Mild degree of wall of small bowel distention could reflect a  partial degree of obstruction or ileus.   A:   Abdominal Pain. S/P sigmoid colectomy with primary anastomosis and diverting ileostomy. Probable anastomotic leak, multiple inta abd abscesses.  S/p new drain placement in IR 6/8 now with total 4 drains.  P: -pain control  -Recs per CCS - no surgery recommended currently, continue draining -WOC for ileostomy  A: ? Ileus/nausea/vomiting P: -Zofran, phenergan PRN  -Reglan added 6/8,  increase to q8h 10 mg  -Keep NPO -can we limit Dilaudid which would help issue -OG tube per CCS  Rectal bleeding- no further since 6/8 P:   -continue to monitor -f/u CBC -NO indication further   HEMATOLOGIC  Lab 03/04/12 0440 03/03/12 0458 03/02/12 1600 03/02/12 0445 03/01/12 0442 02/28/12 0600  HGB 8.4* 9.3* 8.7* 6.9* 8.2* --  HCT 25.2* 26.7* 26.3* 22.1* 24.3* --  PLT 120* 109* 101* 85* 56* --  INR -- -- -- -- -- 1.33  APTT -- -- -- -- -- 32   Hematology panel 6/1: no schitocytes, haptoglogin 154, fibrinogen 410  A:   Refractory Thrombocytopenia, suspect d/t consumption vs. Drug related (? Cephlosporin).  Cannot r/o antibody induced drop. S/p chemo / radiation for cervical cancer. Last treatment in 10/2010. Appreciate Dr Precious Reel input - no evidence for TTP, HITT, or DIC. 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 Suspect due to meds, but all possible culprits have been stopped. Has failed Amicar, IVIg, and steroid treatment.   **plt trending up 6/9.  ?beta lactam as culprit?  P: -improved plat daily Continue to drain infection and follow trend ENT removed packing 6/10- no bleeding  Anemia -secondary to bleeding from nose and rectum, 2 u PRBCs 6/3, 6/6, 6/8.  Cont to have ?worsenig rectal bleeding.  P: -avoid further Tx  INFECTIOUS  Lab 03/04/12 0440 03/03/12 0458 03/02/12 1600 03/02/12 0445 03/01/12 0442  WBC 10.3 12.8* 13.6* 12.8* 10.9*  PROCALCITON -- -- -- -- <0.10   Cultures: UC 5/6>>>neg 5/13 MRSA PCR>>>neg 5/23 Abd abscess drain>>>multiple organisims 5/23 Abd drainage>>>few e-Coli 5/23 RU abd drain>>>rare viridans, abundant bacteroides fragilis, beta lactam positive 6/2 HIV>>> neg  Antibiotics: Augmentin 5/20>>5/22 Cefoxitin 5/13>>>5/15 Rocephin 5/6>>>5/8, 5/30>>>6/3 Cipro 5/29>>>5/30>>resumed 6/11>> Flagyl 5/29>>>6/5>>resumed 6/10>> Zosyn 5/22>>>5/29 Levaquin 6/3>>>6/5   A:   Abdominal Abscess - IR drains in place P:   On  flagyl/cipro per ID Avoid beta lactams   ENDOCRINE  Lab 03/02/12 0802 03/02/12 0402 03/02/12 0014 03/01/12 2016 03/01/12 1622  GLUCAP 113* 110* 107* 110* 95   A:   Hyperglycemia- well controled  P:   -monitor CBG glucose -Continue SSI  NEUROLOGIC  A:  Anxiety  P:   -supportive care -PRN ativan  Mobilize when able, PT consulted 6/10  BEST PRACTICE / DISPOSITION Level of Care: SDU Primary Service:  CCS Consultants: Hematology, GI  I will ask [name] to assume medical care 6/13 Code Status:  Full Diet:  Clear DVT Px:  scd's GI Px:  pepcid Skin Integrity:  intact Social / Family:   PCCM to sign off, will ask [name] to assume medical  care 6/13  Shan Levans Beeper  [phone]  Cell  [phone]  If no response or cell goes to voicemail, call beeper [phone]

## 2012-03-04 NOTE — Progress Notes (Signed)
29 Days Post-Op  Subjective: Cervical ca; post op abscesses Drains x 4 Pt actually seems better today, moved up to SDU Wants OG out, has been passing flatus. Not using much pain meds  Objective: Vital signs in last 24 hours: Temp:  [98.4 F (36.9 C)-99.4 F (37.4 C)] 99 F (37.2 C) (06/12 0755) Pulse Rate:  [93-107] 100  (06/12 0328) Resp:  [14-22] 20  (06/12 0842) BP: (115-138)/(73-92) 122/79 mmHg (06/12 0328) SpO2:  [99 %-100 %] 100 % (06/12 0842) Weight:  [150 lb 4.8 oz (68.176 kg)] 150 lb 4.8 oz (68.176 kg) (06/11 1546) Last BM Date: 03/02/12  Intake/Output from previous day: 06/11 0701 - 06/12 0700 In: 890 [TPN:840] Out: 3675 [Urine:2050; Emesis/NG output:1500; Drains:85; Stool:40] Intake/Output this shift: Total I/O In: -  Out: 57.5 [Drains:57.5]  PE:  All drains intact; new dressings applied; all clean and dry LLQ with most ouput, though down to only 30mL, no longer blood tinged Other drains with scant output plts 120 today Pt in better spirits Wbc 10.3  Lab Results:   Basename 03/04/12 0440 03/03/12 0458  WBC 10.3 12.8*  HGB 8.4* 9.3*  HCT 25.2* 26.7*  PLT 120* 109*   BMET  Basename 03/04/12 0440 03/03/12 1522  NA 133* 131*  K 3.5 3.5  CL 98 96  CO2 27 28  GLUCOSE 106* 107*  BUN 9 10  CREATININE 0.41* 0.34*  CALCIUM 10.2 10.0   PT/INR No results found for this basename: LABPROT:2,INR:2 in the last 72 hours ABG No results found for this basename: PHART:2,PCO2:2,PO2:2,HCO3:2 in the last 72 hours  Studies/Results: No results found.  Anti-infectives:   Assessment/Plan: s/p Procedure(s) (LRB): COLON RESECTION SIGMOID (N/A)  Post op abd abscesses Drains x4 placed in IR Pt looks clinically better Afeb; plts rising   Hilario Robarts 03/04/2012

## 2012-03-04 NOTE — Progress Notes (Signed)
PARENTERAL NUTRITION CONSULT NOTE - FOLLOW UP  Pharmacy Consult for TPN Indication: intraabdominal abscess, ileus  Allergies  Allergen Reactions  . Labetalol Hcl Itching and Other (See Comments)    Bumps to bilateral arms.  . Morphine And Related Hives   Patient Measurements: Height: 5\' 4"  (162.6 cm) Weight: 150 lb 4.8 oz (68.176 kg) IBW/kg (Calculated) : 54.7   Vital Signs: Temp: 98.4 F (36.9 C) (06/12 0328) Temp src: Oral (06/12 0328) BP: 122/79 mmHg (06/12 0328) Pulse Rate: 100  (06/12 0328) Intake/Output from previous day: 06/11 0701 - 06/12 0700 In: 610 [TPN:560] Out: 2125 [Urine:1200; Emesis/NG output:800; Drains:85; Stool:40] Intake/Output from this shift:  Labs:  [facility] 03/04/12 0440 03/03/12 0458 03/02/12 1600  WBC 10.3 12.8* 13.6*  HGB 8.4* 9.3* 8.7*  HCT 25.2* 26.7* 26.3*  PLT 120* 109* 101*  APTT -- -- --  INR -- -- --    Basename 03/04/12 0440 03/03/12 1522 03/03/12 0458 03/02/12 0445  NA 133* 131* 130* --  K 3.5 3.5 3.8 --  CL 98 96 96 --  CO2 27 28 28  --  GLUCOSE 106* 107* 85 --  BUN 9 10 10  --  CREATININE 0.41* 0.34* 0.35* --  LABCREA -- -- -- --  CREAT24HRUR -- -- -- --  CALCIUM 10.2 10.0 10.1 --  MG 1.9 -- 1.6 1.9  PHOS 3.4 -- 4.1 5.0*  PROT -- -- -- 5.4*  ALBUMIN -- -- -- 1.6*  AST -- -- -- 18  ALT -- -- -- 19  ALKPHOS -- -- -- 99  BILITOT -- -- -- 0.6  BILIDIR -- -- -- --  IBILI -- -- -- --  PREALBUMIN -- -- -- 21.5  TRIG -- -- -- 135  CHOLHDL -- -- -- --  CHOL -- -- -- 75   Estimated Creatinine Clearance: 92.2 ml/min (by C-G formula based on Cr of 0.41).   Basename 03/02/12 0802 03/02/12 0402 03/02/12 0014  GLUCAP 113* 110* 107*   Insulin Requirements in the past 24 hours:  SSI, CBG checks d/c'd 6/10 d/t minimal SSI needs.  NO insulin in TNA    Current Nutrition:  - Clinimix E5/20 at 70 ml/hr (at goal) - Avoiding lipids due to possible contribution to thrombocytopenia.   - NPO  Nutritional Goals:  1800-2000 kCal,  80-95 grams of protein per day.    Clinimix E5/20 goal rate of 48ml/hr will provide an average of 1478 kcal and 84g protein per day based on RD assessment (82% total kcal and 100% protein needs)  Assessment: 41 yof with a history of cervical cancer who is s/p sigmoid colectomy and diverting ileostomy on 5/14. Developed multiple intra-abdominal abscesses d/t anastomotic leak and had 3 perc drains placed. On 5/29 patient had multiple blood BM, hemoptysis, and abdominal pain. Plts dropped to <5 (240k on 5/25) but have now improved to 120 . CT 5/30 showed additional abscesses and concern for fistula formation  CT 6/6: persistent abscesses.  New periotoneal drain placed by IR 6/7, also has OGT with significant output.    GI:  No epistaxis or rectal bleeding currently. OR plans deferred d/t significant improvement since 6/7. High volume emesis/NGT output-scheduled reglan continues. Off PPI and H2RA d/t thrombocytopenia risk. Doubt any PO absorption.  Endo: No hx DM. CBG controlled.  Isolated elevated BGs contaminated from TPN line.  SSI, CBG checks d/c'd 6/10.   Lytes:  Na low, trending up. ?Fluid o/load. IVF at [facility]. Mag increased to 1.9 after bolus.  Corr Ca  12.12. Other lytes ok.  Acid-base status ok.  Renal: SCr good, UOP 0.7 ml/kg/hr.  Neg balance  Pulm: RA  Cards:Hemodynamically stable  Hepatobil: Alk phos/LFTs/bili wnl. Albumin remains low.  Triglycerides and cholesterol WNL, prealbumin trending up, WNL  Neuro: standing po morphine BID and dilaudid PCA   Heme/Onc: Persistent thrombocytopenia improving with steroids, IVIG, N-Plate, discontinuation of multiple offending meds. Nasal and rectal bleeding decreased, but still needing transfusions for low Hgb; she received tranexamic acid (6/3) and is receiving amicar. Noted several blood product transfusions to date.    ID:D#2 cipro/flagyl. Pt restarted on abx for leukocytosis, polymicrobial bacterial from new IR drain culture.  (CCM stopped all  abx 6/4 d/t potential for inducing thrombocytopenia);  WBC trending down; afebrile.  ID continues to refrain from using beta-lactams d/t potential cause of thrombocytopenia.   Best Practices: SCDs.    Plan:  - Continue Clinimix E 5/20 at goal of 70 ml/hr  - MVI and Trace elements MWF only d/t national shortage - Fat emulsion omitted d/t to chance of worsening thrombocytopenia. Risk of EFAD if fats completely avoided for 3-4 weeks- consider resuming IV fat emulsion the week of 6/24 if TPN continues. - f/u AM labs, monitor Mag, K+ trend  Haynes Hoehn, PharmD 03/04/2012 7:40 AM  Pager: [phone]

## 2012-03-04 NOTE — Evaluation (Addendum)
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   Pt limited from overal fatigue and impaired endurance due to multiple abdominal surgery and lengthy hospital admission.  Pt will benefit from acute PT services to improve overall mobility and endurance to prepare for safe d/c.    PT Assessment  Patient needs continued PT services    Follow Up Recommendations  Home health PT    Barriers to Discharge        lEquipment Recommendations  Rolling walker with 5" wheels    Recommendations for Other Services     Frequency Min 3X/week    Precautions / Restrictions Restrictions Weight Bearing Restrictions: No   Pertinent Vitals/Pain 8/10 abdominal pain       Mobility  Bed Mobility Bed Mobility: Sit to Supine Sit to Supine: 4: Min assist Details for Bed Mobility Assistance: (A) with LE into bed and cues for safety due to lines Transfers Transfers: Sit to Stand;Stand to Sit Sit to Stand: 4: Min assist;From chair/3-in-1;From bed Stand to Sit: 4: Min assist;To chair/3-in-1;To bed Stand Pivot Transfers: 4: Min assist Details for Transfer Assistance: (A) to initiate transfer and slowly descend to bed/3n1.  VCs for hand placement. Ambulation/Gait Ambulation/Gait Assistance: 1: +2 Total assist Ambulation/Gait: Patient Percentage: 70% Assistive device: Rolling walker; 5' Ambulation/Gait Assistance Details: (A) to maintain balance with cues for body position within RW. Gait Pattern: Decreased stride length;Shuffle;Antalgic General Gait Details: +2 needed for safety with lines      Exercises     PT Diagnosis: Difficulty walking;Generalized weakness;Acute pain  PT Problem List: Decreased activity tolerance;Decreased mobility;Decreased knowledge of use of DME;Pain PT Treatment Interventions: DME instruction;Gait training;Stair training;Functional mobility training;Therapeutic activities;Therapeutic exercise;Balance training;Patient/family education   PT Goals Acute Rehab PT Goals PT Goal Formulation: With patient Time For Goal Achievement: 03/18/12 Potential to Achieve Goals: Good Pt will go Supine/Side to Sit: with modified independence PT Goal: Supine/Side to Sit - Progress: Goal set today Pt will go Sit to Stand: with modified independence PT Goal: Sit to Stand - Progress: Goal set today Pt will go Stand to Sit: with modified independence PT Goal: Stand to Sit - Progress: Goal set today Pt will Ambulate: >150 feet;with modified independence;with least restrictive assistive device PT Goal: Ambulate - Progress: Goal set today Pt will Go Up / Down Stairs: 1-2 stairs;with modified independence;with least restrictive assistive device PT Goal: Up/Down Stairs - Progress: Goal set today  Visit Information  Last PT Received On: 03/04/12 Assistance Needed: +2 (for lines)    Subjective Data  Subjective: "I've been wanting to move but get so    Prior Functioning  Home Living Lives With: Family Available Help at Discharge: Family Type of Home: Apartment Home Access: Stairs to enter Secretary/administrator of Steps: 1 Entrance Stairs-Rails: None Home Layout: Two level;Full bath on main level (Plans to stay downstairs) Bathroom Shower/Tub: Walk-in Contractor: Standard Home Adaptive Equipment: None Prior Function Level of Independence: Independent Able to Take Stairs?: Yes Driving: Yes Communication Communication: No difficulties    Cognition  Overall Cognitive Status: Appears within functional limits for tasks  assessed/performed Arousal/Alertness: Awake/alert Orientation Level: Appears intact for  tasks assessed Behavior During Session: Flat affect    Extremity/Trunk Assessment Right Lower Extremity Assessment RLE ROM/Strength/Tone: Within functional levels Left Lower Extremity Assessment LLE ROM/Strength/Tone: Within functional levels   Balance    End of Session PT - End of Session Activity Tolerance: Patient limited by fatigue;Patient limited by pain Patient left: in bed;with call bell/phone within reach Nurse Communication: Mobility status   Malayna Noori 03/04/2012, 1:54 PM Jake Shark, PT DPT [phone]

## 2012-03-04 NOTE — Progress Notes (Signed)
Nutrition Follow-up  Hematology note reviewed -- idiopathic thrombocytopenia improving.  NGT to LIS. Drains x 4, placed in IR. Abdominal pain better, ileostomy functioning.  Patient is receiving TPN with Clinimix E 5/20 @ 70 ml/hr. Multivitamins, and trace elements are provided 3 times weekly (MWF) due to national backorder. Lipids are not being given in an effort to help prevent platelets from dropping further. Provides 1478 kcal and 84 grams protein daily. Meets 82% minimum estimated kcal and 100% minimum estimated protein needs.  Diet Order:  NPO  Meds: Scheduled Meds:   . alteplase  2 mg Intracatheter Once  . antiseptic oral rinse  15 mL Mouth Rinse QID  . barrier cream  1 application Topical TID  . chlorhexidine  15 mL Mouth/Throat BID  . ciprofloxacin  400 mg Intravenous Q12H  . folic acid  1 mg Intravenous Daily  . HYDROmorphone PCA 0.3 mg/mL   Intravenous Q4H  . magnesium sulfate 1 - 4 g bolus IVPB  2 g Intravenous Once  . metoCLOPramide (REGLAN) injection  10 mg Intravenous Q8H  . metronidazole  500 mg Intravenous Q8H  . prochlorperazine  5 mg Intravenous QID  . sodium chloride  10-40 mL Intracatheter Q12H  . sodium chloride      . sodium chloride       Continuous Infusions:   . sodium chloride 20 mL/hr at 03/02/12 2000  . TPN (CLINIMIX) +/- additives 70 mL/hr at 03/02/12 2000  . TPN (CLINIMIX) +/- additives 70 mL/hr at 03/04/12 1000  . TPN (CLINIMIX) +/- additives     PRN Meds:.sodium chloride, acetaminophen, diphenhydrAMINE, diphenhydrAMINE, hydrALAZINE, HYDROmorphone (DILAUDID) injection, LORazepam, naloxone, ondansetron (ZOFRAN) IV, ondansetron (ZOFRAN) IV, ondansetron, promethazine, sodium chloride, sodium chloride, zolpidem  Labs:  CMP     Component Value Date/Time   NA 133* 03/04/2012 0440   K 3.5 03/04/2012 0440   CL 98 03/04/2012 0440   CO2 27 03/04/2012 0440   GLUCOSE 106* 03/04/2012 0440   BUN 9 03/04/2012 0440   CREATININE 0.41* 03/04/2012 0440   CALCIUM 10.2  03/04/2012 0440   CALCIUM 10.7* 02/01/2012 0705   PROT 5.4* 03/02/2012 0445   ALBUMIN 1.6* 03/02/2012 0445   AST 18 03/02/2012 0445   ALT 19 03/02/2012 0445   ALKPHOS 99 03/02/2012 0445   BILITOT 0.6 03/02/2012 0445   GFRNONAA >90 03/04/2012 0440   GFRAA >90 03/04/2012 0440     Intake/Output Summary (Last 24 hours) at 03/04/12 1130 Last data filed at 03/04/12 1000  Gross per 24 hour  Intake   1100 ml  Output 3992.5 ml  Net -2892.5 ml    CBG (last 3)   Basename 03/02/12 0802 03/02/12 0402 03/02/12 0014  GLUCAP 113* 110* 107*    Weight Status:  68.1 kg (6/11) -- fluctuating  Re-estimated needs:  1800-2000 kcals, 80-95 gm protein  Nutrition Dx:  Inadequate Oral Intake, ongoing  Goal:  TPN to meet >90% of estimated protein needs, maximize energy provision as able during lipid omission due to chance of worsening thrombocytopenia, met Monitor: TPN prescription, PO diet advancement, weight, labs, I/O's  Intervention:    TPN per pharmacy  RD to follow for nutrition care plan  Alger Memos Pager #:  [phone]

## 2012-03-04 NOTE — Progress Notes (Signed)
INFECTIOUS DISEASE PROGRESS NOTE  ID: Vanessa [name] is a 36 y.o. female with complex medical history of cervical CA, s/p XRT in Jan 2012 s/p colectomy and divertin ileostomy ion 02/04/12 POD #27 complicated with multiple abdominal abscesses status post bowel surgery placed on broad spectrum abtx for polymicrobial peritoneal abscess complicated by bleeding and thrombocytopenia, off of antibiotics since 6/4, peritoneal drain repositioned on 6/7  Subjective: Remains afebrile,  Tired of her OGT.  She denies fevers, chills, nightsweats nor spontaneously bleeding  24hr: wbc down to 10 ; plts in 100s  Abtx: day #2 cipro IV 6/11 Metronidazole IV 6/11  Ceftriaxone d/c'd 6/3 Metronidazole d/c'd 6/4 Levo on 6/3 and 6/4  Medications:     . alteplase  2 mg Intracatheter Once  . antiseptic oral rinse  15 mL Mouth Rinse QID  . barrier cream  1 application Topical TID  . chlorhexidine  15 mL Mouth/Throat BID  . ciprofloxacin  400 mg Intravenous Q12H  . folic acid  1 mg Intravenous Daily  . HYDROmorphone PCA 0.3 mg/mL   Intravenous Q4H  . metoCLOPramide (REGLAN) injection  10 mg Intravenous Q8H  . metronidazole  500 mg Intravenous Q8H  . prochlorperazine  5 mg Intravenous QID  . sodium chloride  10-40 mL Intracatheter Q12H  . sodium chloride      . sodium chloride        Objective: Vital signs in last 24 hours: Temp:  [98.4 F (36.9 C)-99.4 F (37.4 C)] 99.4 F (37.4 C) (06/12 1218) Pulse Rate:  [93-102] 102  (06/12 1200) Resp:  [14-22] 16  (06/12 1200) BP: (119-133)/(74-90) 121/74 mmHg (06/12 1200) SpO2:  [98 %-100 %] 100 % (06/12 1200) Weight:  [150 lb 4.8 oz (68.176 kg)] 150 lb 4.8 oz (68.176 kg) (06/11 1546) General appearance: alert, in no distress and appears fatigued, chronically ill, strong necrotic smell  HEENt= og in place to decompress stomach. Resp: clear to auscultation bilaterally  Cardio: Tachy, RR  GI: + drain in place, staples c/d/i,  Extremities: extremities  normal, atraumatic, no cyanosis or edema      Lab Results  Basename 03/04/12 0440 03/03/12 1522 03/03/12 0458  WBC 10.3 -- 12.8*  HGB 8.4* -- 9.3*  HCT 25.2* -- 26.7*  NA 133* 131* --  K 3.5 3.5 --  CL 98 96 --  CO2 27 28 --  BUN 9 10 --  CREATININE 0.41* 0.34* --  GLU -- -- --   Liver Panel  Basename 03/02/12 0445  PROT 5.4*  ALBUMIN 1.6*  AST 18  ALT 19  ALKPHOS 99  BILITOT 0.6  BILIDIR --  IBILI --    Microbiology: Recent Results (from the past 240 hour(s))  CULTURE, ROUTINE-ABSCESS     Status: Normal   Collection Time   02/28/12  3:52 PM      Component Value Range Status Comment   Specimen Description ABSCESS PERITONEAL CAVITY   Final    Special Requests LEFT UPPER QUADRANT ABSCESS,YELLOW PURULENT FLUID   Final    Gram Stain     Final    Value: ABUNDANT WBC PRESENT,BOTH PMN AND MONONUCLEAR     NO SQUAMOUS EPITHELIAL CELLS SEEN     ABUNDANT GRAM POSITIVE COCCI IN PAIRS     MODERATE GRAM NEGATIVE COCCOBACILLI   Culture     Final    Value: MODERATE ESCHERICHIA COLI     ABUNDANT VIRIDANS STREPTOCOCCUS   Report Status 03/02/2012 FINAL   Final    Organism  ID, Bacteria ESCHERICHIA COLI   Final      Assessment/Plan: 36yo F with hx of cervical Ca, XRT with numerous pelvic adhesions s/p colectomy and diverting ileostomy in 02/04/12 complicated by multiple peritoneal abscess( polymicrobial including pansensitive strep), c/b bleeding and thrombocytopenia likely from betalactams.   1)  polymicrobial intraperitoneal abscess, including pan sensitive ecoli. restarted ciprofloxacin and metronidazole on 6/11. Would still consider to need surgery if she does not clinically improve. re-image in a 10-12 days to ensure there is appropriate drainage of abscess.  2) thrombocytopenia improving but not back to baseline. Will refrain from using beta lactams  Monico Sudduth Infectious Diseases 03/04/2012, 1:40 PM

## 2012-03-04 NOTE — Progress Notes (Signed)
misreable with OGT but has put out 1L No nausea Patient agrees to have NGT if N/V - which is likely Will remove OGT Patient examined and I agree with the assessment and plan  Violeta Gelinas, MD, MPH, FACS Pager: [phone]  03/04/2012 5:59 PM

## 2012-03-04 NOTE — Progress Notes (Signed)
Patient ID: Vanessa [name], female   DOB: [birth year], 36 y.o.   MRN: [id number] 29 Days Post-Op  Subjective: Very distressed about the OGT, wants it pulled, Much less abdominal pain, ileostomy functioning   Objective: Vital signs in last 24 hours: Temp:  [98.4 F (36.9 C)-99.4 F (37.4 C)] 99 F (37.2 C) (06/12 0800) Pulse Rate:  [93-107] 99  (06/12 0800) Resp:  [14-22] 20  (06/12 0842) BP: (115-133)/(73-90) 119/81 mmHg (06/12 0800) SpO2:  [99 %-100 %] 100 % (06/12 0842) Weight:  [150 lb 4.8 oz (68.176 kg)] 150 lb 4.8 oz (68.176 kg) (06/11 1546) Last BM Date: 03/02/12  Intake/Output from previous day: 06/11 0701 - 06/12 0700 In: 890 [TPN:840] Out: 3675 [Urine:2050; Emesis/NG output:1500; Drains:85; Stool:40] Intake/Output this shift: Total I/O In: 210 [TPN:210] Out: 357.5 [Emesis/NG output:300; Drains:57.5]  General appearance: alert, cooperative and mild distress Resp: clear to auscultation bilaterally Cardio: regular rate and rhythm GI: soft. minimal LLQ tenderness, few BS, ileostomy pink with output, wound granulating well EXT: calves soft  Lab Results:   Basename 03/04/12 0440 03/03/12 0458  WBC 10.3 12.8*  HGB 8.4* 9.3*  HCT 25.2* 26.7*  PLT 120* 109*   BMET  Basename 03/04/12 0440 03/03/12 1522  NA 133* 131*  K 3.5 3.5  CL 98 96  CO2 27 28  GLUCOSE 106* 107*  BUN 9 10  CREATININE 0.41* 0.34*  CALCIUM 10.2 10.0   PT/INR No results found for this basename: LABPROT:2,INR:2 in the last 72 hours ABG No results found for this basename: PHART:2,PCO2:2,PO2:2,HCO3:2 in the last 72 hours  Studies/Results: No results found.  Anti-infectives: Anti-infectives     Start     Dose/Rate Route Frequency Ordered Stop   03/03/12 1200   metroNIDAZOLE (FLAGYL) IVPB 500 mg        500 mg 100 mL/hr over 60 Minutes Intravenous Every 8 hours 03/03/12 0957     03/03/12 1100   ciprofloxacin (CIPRO) IVPB 400 mg        400 mg 200 mL/hr over 60 Minutes Intravenous Every  12 hours 03/03/12 0957     02/24/12 1600   levofloxacin (LEVAQUIN) IVPB 750 mg  Status:  Discontinued        750 mg 100 mL/hr over 90 Minutes Intravenous Every 24 hours 02/24/12 1526 02/26/12 1012   02/20/12 1415   cefTRIAXone (ROCEPHIN) 1 g in dextrose 5 % 50 mL IVPB  Status:  Discontinued        1 g 100 mL/hr over 30 Minutes Intravenous Every 24 hours 02/20/12 1404 02/24/12 1526   02/19/12 1800   ciprofloxacin (CIPRO) IVPB 400 mg  Status:  Discontinued        400 mg 200 mL/hr over 60 Minutes Intravenous Every 12 hours 02/19/12 1612 02/20/12 1404   02/19/12 1700   metroNIDAZOLE (FLAGYL) IVPB 500 mg  Status:  Discontinued        500 mg 100 mL/hr over 60 Minutes Intravenous 3 times per day 02/19/12 1612 02/26/12 1012   02/12/12 0800   piperacillin-tazobactam (ZOSYN) IVPB 3.375 g  Status:  Discontinued        3.375 g 12.5 mL/hr over 240 Minutes Intravenous 3 times per day 02/12/12 0800 02/19/12 1612   02/10/12 1000   amoxicillin-clavulanate (AUGMENTIN) 875-125 MG per tablet 1 tablet  Status:  Discontinued        1 tablet Oral Every 12 hours 02/10/12 0823 02/12/12 0800   02/04/12 1800   cefOXitin (MEFOXIN) 1 g in  dextrose 5 % 50 mL IVPB        1 g 100 mL/hr over 30 Minutes Intravenous Every 6 hours 02/04/12 1621 02/05/12 0617   02/04/12 0000   cefOXitin (MEFOXIN) 2 g in dextrose 5 % 50 mL IVPB        2 g 100 mL/hr over 30 Minutes Intravenous 60 min pre-op 02/03/12 0333 02/04/12 1145   02/03/12 0500   cefOXitin (MEFOXIN) 2 g in dextrose 5 % 50 mL IVPB  Status:  Discontinued        2 g 100 mL/hr over 30 Minutes Intravenous 60 min pre-op 02/02/12 1156 02/03/12 0333   02/03/12 0000   cefOXitin (MEFOXIN) 2 g in dextrose 5 % 50 mL IVPB  Status:  Discontinued        2 g 100 mL/hr over 30 Minutes Intravenous 60 min pre-op 02/02/12 1004 02/02/12 1156   01/27/12 2200   cefTRIAXone (ROCEPHIN) 1 g in dextrose 5 % 50 mL IVPB        1 g 100 mL/hr over 30 Minutes Intravenous Every 24 hours  01/27/12 2152 01/29/12 2359   01/27/12 1915   cefTRIAXone (ROCEPHIN) 1 g in dextrose 5 % 50 mL IVPB        1 g 100 mL/hr over 30 Minutes Intravenous  Once 01/27/12 1914 01/27/12 1957          Assessment/Plan: s/p Procedure(s) (LRB): COLON RESECTION SIGMOID (N/A) S/P sigmoid colectomy and ileostomy with anastamotic leak Abscesses - drain repositioned and new drain placed by IR last Friday, off ABX due to thrombocytopenia, continue drain care ID - off ABX as above, appreciate ID F/U Thrombocytopenia - likely med related, significant improvement, appreciate Hematology F/U Anemia ABL - better S/P PRBC Ileus - significant OGT output (700 overnight and about 500 out this am), less output but will continue for now.  But will discuss with Dr. Janee Morn   FEN - TNA, Na slightly better with IVF at [facility]  LOS: 37 days    Martavis Gurney 03/04/2012

## 2012-03-05 DIAGNOSIS — K567 Ileus, unspecified: Secondary | ICD-10-CM | POA: Diagnosis not present

## 2012-03-05 DIAGNOSIS — K631 Perforation of intestine (nontraumatic): Secondary | ICD-10-CM

## 2012-03-05 DIAGNOSIS — Z9049 Acquired absence of other specified parts of digestive tract: Secondary | ICD-10-CM

## 2012-03-05 DIAGNOSIS — D62 Acute posthemorrhagic anemia: Secondary | ICD-10-CM | POA: Diagnosis present

## 2012-03-05 DIAGNOSIS — K625 Hemorrhage of anus and rectum: Secondary | ICD-10-CM

## 2012-03-05 DIAGNOSIS — Z789 Other specified health status: Secondary | ICD-10-CM | POA: Diagnosis not present

## 2012-03-05 DIAGNOSIS — M311 Thrombotic microangiopathy: Secondary | ICD-10-CM

## 2012-03-05 DIAGNOSIS — K651 Peritoneal abscess: Secondary | ICD-10-CM | POA: Clinically undetermined

## 2012-03-05 LAB — COMPREHENSIVE METABOLIC PANEL
AST: 15 U/L (ref 0–37)
Albumin: 1.7 g/dL — ABNORMAL LOW (ref 3.5–5.2)
Calcium: 10.2 mg/dL (ref 8.4–10.5)
Chloride: 97 mEq/L (ref 96–112)
Creatinine, Ser: 0.38 mg/dL — ABNORMAL LOW (ref 0.50–1.10)
Total Protein: 6 g/dL (ref 6.0–8.3)

## 2012-03-05 LAB — CBC
HCT: 24.2 % — ABNORMAL LOW (ref 36.0–46.0)
Hemoglobin: 8 g/dL — ABNORMAL LOW (ref 12.0–15.0)
RBC: 2.59 MIL/uL — ABNORMAL LOW (ref 3.87–5.11)
WBC: 11 10*3/uL — ABNORMAL HIGH (ref 4.0–10.5)

## 2012-03-05 LAB — PHOSPHORUS: Phosphorus: 3.4 mg/dL (ref 2.3–4.6)

## 2012-03-05 MED ORDER — CLINIMIX E/DEXTROSE (5/20) 5 % IV SOLN
INTRAVENOUS | Status: AC
  Administered 2012-03-05: 17:00:00 via INTRAVENOUS
  Filled 2012-03-05: qty 2000

## 2012-03-05 MED ORDER — GATORADE (BH)
240.0000 mL | Freq: Three times a day (TID) | ORAL | Status: DC
  Administered 2012-03-05 – 2012-03-08 (×6): 240 mL via ORAL

## 2012-03-05 MED ORDER — FOLIC ACID 1 MG PO TABS
1.0000 mg | ORAL_TABLET | Freq: Every day | ORAL | Status: DC
  Administered 2012-03-05 – 2012-03-11 (×7): 1 mg via ORAL
  Filled 2012-03-05 (×7): qty 1

## 2012-03-05 MED ORDER — HYDROMORPHONE 0.3 MG/ML IV SOLN
INTRAVENOUS | Status: DC
  Administered 2012-03-05: 17:00:00 via INTRAVENOUS
  Administered 2012-03-05: 3.58 mg via INTRAVENOUS
  Administered 2012-03-05: 3.74 mg via INTRAVENOUS
  Administered 2012-03-05: 4.32 mg via INTRAVENOUS
  Administered 2012-03-06: 2.97 mg via INTRAVENOUS
  Administered 2012-03-06: 3.02 mg via INTRAVENOUS
  Administered 2012-03-06: 4 mg via INTRAVENOUS
  Administered 2012-03-06: 15:00:00 via INTRAVENOUS
  Administered 2012-03-06: 1.24 mg via INTRAVENOUS
  Administered 2012-03-06: 3.2 mg via INTRAVENOUS
  Administered 2012-03-06: via INTRAVENOUS
  Administered 2012-03-07: 0.943 mg via INTRAVENOUS
  Administered 2012-03-07: 25 mL via INTRAVENOUS
  Filled 2012-03-05 (×4): qty 25

## 2012-03-05 MED ORDER — FERROUS FUMARATE 325 (106 FE) MG PO TABS
1.0000 | ORAL_TABLET | Freq: Every day | ORAL | Status: DC
  Administered 2012-03-06 – 2012-03-11 (×6): 106 mg via ORAL
  Filled 2012-03-05 (×7): qty 1

## 2012-03-05 MED ORDER — BOOST / RESOURCE BREEZE PO LIQD
1.0000 | Freq: Three times a day (TID) | ORAL | Status: DC
  Administered 2012-03-05: 1 via ORAL

## 2012-03-05 NOTE — Progress Notes (Signed)
PARENTERAL NUTRITION CONSULT NOTE - FOLLOW UP  Pharmacy Consult for TPN Indication: intraabdominal abscess, ileus  Allergies  Allergen Reactions  . Labetalol Hcl Itching and Other (See Comments)    Bumps to bilateral arms.  . Morphine And Related Hives   Patient Measurements: Height: 5\' 4"  (162.6 cm) Weight: 150 lb 4.8 oz (68.176 kg) IBW/kg (Calculated) : 54.7   Vital Signs: Temp: 99.6 F (37.6 C) (06/13 0400) Temp src: Oral (06/13 0400) BP: 122/75 mmHg (06/13 0400) Pulse Rate: 105  (06/13 0400) Intake/Output from previous day: 06/12 0701 - 06/13 0700 In: 3050 [P.O.:40; I.V.:920; IV Piggyback:500; TPN:1540] Out: 2667.5 [Urine:1225; Emesis/NG output:1300; Drains:142.5] Intake/Output from this shift:  Labs:  [facility] 03/05/12 0500 03/04/12 0440 03/03/12 0458  WBC 11.0* 10.3 12.8*  HGB 8.0* 8.4* 9.3*  HCT 24.2* 25.2* 26.7*  PLT 150 120* 109*  APTT -- -- --  INR -- -- --    Basename 03/05/12 0500 03/04/12 1550 03/04/12 0440 03/03/12 0458  NA 131* 132* 133* --  K 3.6 3.6 3.5 --  CL 97 98 98 --  CO2 26 28 27  --  GLUCOSE 109* 97 106* --  BUN 8 9 9  --  CREATININE 0.38* 0.35* 0.41* --  LABCREA -- -- -- --  CREAT24HRUR -- -- -- --  CALCIUM 10.2 10.3 10.2 --  MG 1.8 -- 1.9 1.6  PHOS 3.4 -- 3.4 4.1  PROT 6.0 -- -- --  ALBUMIN 1.7* -- -- --  AST 15 -- -- --  ALT 14 -- -- --  ALKPHOS 103 -- -- --  BILITOT 0.6 -- -- --  BILIDIR -- -- -- --  IBILI -- -- -- --  PREALBUMIN -- -- -- --  TRIG -- -- -- --  CHOLHDL -- -- -- --  CHOL -- -- -- --   Estimated Creatinine Clearance: 92.2 ml/min (by C-G formula based on Cr of 0.38).   Basename 03/02/12 0802  GLUCAP 113*   Insulin Requirements in the past 24 hours:  SSI, CBG checks d/c'd d/t minimal SSI needs.  NO insulin in TNA    Current Nutrition:  - Clinimix E5/20 at 70 ml/hr (at goal) - Avoiding lipids due to possible contribution to thrombocytopenia.   - NPO  Nutritional Goals:  1800-2000 kCal, 80-95 grams of  protein per day.    Clinimix E5/20 goal rate of 95ml/hr will provide an average of 1478 kcal and 84g protein per day based on RD assessment (82% total kcal and 100% protein needs)  Assessment: 19 yof with a history of cervical cancer who is s/p sigmoid colectomy and diverting ileostomy on 5/14. Developed multiple intra-abdominal abscesses d/t anastomotic leak and had 3 perc drains placed. On 5/29 patient had multiple blood BM, hemoptysis, and abdominal pain. . CT 5/30 showed additional abscesses and concern for fistula formation  CT 6/6: persistent abscesses.  New periotoneal drain placed by IR 6/7, also has OGT with significant output.  Plts dropped to <5 (240k on 5/25) but have now improved to 150  GI:  Started on clear liquids.  No further epistaxis or rectal bleeding. OR plans deferred d/t significant improvement since 6/7. 4 abdominal drains in place with significant output - OG tube removed 6/12 -continued on scheduled reglan. Off PPI and H2RA d/t thrombocytopenia risk. Doubt any PO absorption.  Endo: No hx DM. CBG controlled. SSI, CBG checks d/c'd 6/10.   Lytes:  Na low - ?Fluid o/load. IVF at [facility]. K/Mag stable.  Corr Ca 12.04. Other lytes  ok.  Acid-base status ok.  Renal: SCr good, UOP 0.7 ml/kg/hr.  Positive balance.    Pulm: RA  Cards:Hemodynamically stable  Hepatobil: Alk phos/LFTs/bili wnl. Albumin remains low.  Triglycerides and cholesterol WNL, prealbumin trending up, WNL  Neuro: standing po morphine BID and dilaudid PCA   Heme/Onc: Thrombocytopenia improving with steroids, IVIG, N-Plate, discontinuation of multiple offending meds. Nasal and rectal bleeding decreased, but still needing transfusions for low Hgb; she received tranexamic acid (6/3) and is receiving amicar. Noted several blood product transfusions to date.  Hgb trending down, platelets continue to rise.   ID:D#3 cipro/flagyl. Pt restarted on abx for leukocytosis, polymicrobial bacterial from 6/7 peritoneal abscess.   (CCM stopped all abx 6/4 d/t potential for inducing thrombocytopenia);  WBC stable; afebrile.  ID continues to refrain from using beta-lactams d/t potential cause of thrombocytopenia.   Best Practices: SCDs.    Plan:  - Continue Clinimix E 5/20 at goal of 70 ml/hr  - MVI and Trace elements MWF only d/t national shortage - Fat emulsion omitted d/t to chance of worsening thrombocytopenia. Risk of EFAD if fats completely avoided for 3-4 weeks- consider resuming IV fat emulsion the week of 6/24 if TPN continues. - f/u AM labs, monitor Mag, K+ trend  Haynes Hoehn, PharmD 03/05/2012 7:41 AM  Pager: [phone]

## 2012-03-05 NOTE — Progress Notes (Signed)
MEDICATION RELATED CONSULT NOTE - FOLLOW UP   Pharmacy Consult for Dilaudid PCA Indication: Pain consult   Allergies  Allergen Reactions  . Labetalol Hcl Itching and Other (See Comments)    Bumps to bilateral arms.  . Morphine And Related Hives    Patient Measurements: Height: 5\' 4"  (162.6 cm) Weight: 150 lb 4.8 oz (68.176 kg) IBW/kg (Calculated) : 54.7  Adjusted Body Weight:   Vital Signs: Temp: 98.4 F (36.9 C) (06/13 0800) Temp src: Oral (06/13 0800) BP: 112/78 mmHg (06/13 0800) Pulse Rate: 102  (06/13 0800) Intake/Output from previous day: 06/12 0701 - 06/13 0700 In: 3140 [P.O.:40; I.V.:940; IV Piggyback:500; TPN:1610] Out: 2667.5 [Urine:1225; Emesis/NG output:1300; Drains:142.5] Intake/Output from this shift: Total I/O In: 90 [I.V.:20; TPN:70] Out: -   Labs:  Basename 03/05/12 0500 03/04/12 1550 03/04/12 0440 03/03/12 0458  WBC 11.0* -- 10.3 12.8*  HGB 8.0* -- 8.4* 9.3*  HCT 24.2* -- 25.2* 26.7*  PLT 150 -- 120* 109*  APTT -- -- -- --  CREATININE 0.38* 0.35* 0.41* --  LABCREA -- -- -- --  CREATININE 0.38* 0.35* 0.41* --  CREAT24HRUR -- -- -- --  MG 1.8 -- 1.9 1.6  PHOS 3.4 -- 3.4 4.1  ALBUMIN 1.7* -- -- --  PROT 6.0 -- -- --  ALBUMIN 1.7* -- -- --  AST 15 -- -- --  ALT 14 -- -- --  ALKPHOS 103 -- -- --  BILITOT 0.6 -- -- --  BILIDIR -- -- -- --  IBILI -- -- -- --   Estimated Creatinine Clearance: 92.2 ml/min (by C-G formula based on Cr of 0.38).   Microbiology: Recent Results (from the past 720 hour(s))  CULTURE, ROUTINE-ABSCESS     Status: Normal   Collection Time   02/13/12  6:33 PM      Component Value Range Status Comment   Specimen Description ABSCESS DRAINAGE ABDOMEN LEFT LOWER   Final    Special Requests DRAIN NO1   Final    Gram Stain     Final    Value: ABUNDANT WBC PRESENT,BOTH PMN AND MONONUCLEAR     NO SQUAMOUS EPITHELIAL CELLS SEEN     ABUNDANT GRAM POSITIVE COCCI IN PAIRS     ABUNDANT GRAM POSITIVE RODS     ABUNDANT GRAM  NEGATIVE RODS   Culture FEW ESCHERICHIA COLI   Final    Report Status 02/17/2012 FINAL   Final    Organism ID, Bacteria ESCHERICHIA COLI   Final   CULTURE, ROUTINE-ABSCESS     Status: Normal   Collection Time   02/13/12  7:00 PM      Component Value Range Status Comment   Specimen Description ABSCESS ABDOMEN DRAINAGE LEFT UPPER   Final    Special Requests DRAIN NO 2   Final    Gram Stain     Final    Value: ABUNDANT WBC PRESENT,BOTH PMN AND MONONUCLEAR     NO SQUAMOUS EPITHELIAL CELLS SEEN     MODERATE GRAM POSITIVE COCCI IN PAIRS AND CHAINS     ABUNDANT GRAM NEGATIVE RODS   Culture     Final    Value: MULTIPLE ORGANISMS PRESENT, NONE PREDOMINANT     Note: NO STAPHYLOCOCCUS AUREUS ISOLATED NO GROUP A STREP (S.PYOGENES) ISOLATED   Report Status 02/17/2012 FINAL   Final   CULTURE, ROUTINE-ABSCESS     Status: Normal   Collection Time   02/13/12  7:00 PM      Component Value Range Status Comment  Specimen Description ABSCESS ABDOMEN DRAINAGE RIGHT UPPER   Final    Special Requests DRAIN NO 3   Final    Gram Stain     Final    Value: ABUNDANT WBC PRESENT,BOTH PMN AND MONONUCLEAR     NO SQUAMOUS EPITHELIAL CELLS SEEN     ABUNDANT GRAM POSITIVE COCCI IN PAIRS     ABUNDANT GRAM NEGATIVE RODS   Culture     Final    Value: RARE VIRIDANS STREPTOCOCCUS     ABUNDANT BACTEROIDES FRAGILIS     Note: BETA LACTAMASE POSITIVE   Report Status 02/17/2012 FINAL   Final   CULTURE, ROUTINE-ABSCESS     Status: Normal   Collection Time   02/28/12  3:52 PM      Component Value Range Status Comment   Specimen Description ABSCESS PERITONEAL CAVITY   Final    Special Requests LEFT UPPER QUADRANT ABSCESS,YELLOW PURULENT FLUID   Final    Gram Stain     Final    Value: ABUNDANT WBC PRESENT,BOTH PMN AND MONONUCLEAR     NO SQUAMOUS EPITHELIAL CELLS SEEN     ABUNDANT GRAM POSITIVE COCCI IN PAIRS     MODERATE GRAM NEGATIVE COCCOBACILLI   Culture     Final    Value: MODERATE ESCHERICHIA COLI     ABUNDANT  VIRIDANS STREPTOCOCCUS   Report Status 03/02/2012 FINAL   Final    Organism ID, Bacteria ESCHERICHIA COLI   Final     Medications:  Scheduled:    . alteplase  2 mg Intracatheter Once  . antiseptic oral rinse  15 mL Mouth Rinse QID  . barrier cream  1 application Topical TID  . chlorhexidine  15 mL Mouth/Throat BID  . ciprofloxacin  400 mg Intravenous Q12H  . feeding supplement  1 Container Oral TID BM  . ferrous fumarate  1 tablet Oral Daily  . folic acid  1 mg Oral Daily  . gatorade (BH)  240 mL Oral TID  . HYDROmorphone PCA 0.3 mg/mL   Intravenous Q4H  . metoCLOPramide (REGLAN) injection  10 mg Intravenous Q8H  . metronidazole  500 mg Intravenous Q8H  . prochlorperazine  5 mg Intravenous QID  . sodium chloride  10-40 mL Intracatheter Q12H  . sodium chloride      . sodium chloride      . DISCONTD: folic acid  1 mg Intravenous Daily  . DISCONTD: HYDROmorphone PCA 0.3 mg/mL   Intravenous Q4H    Assessment: 36yo female with h/o cervical cancer & radiation, s/p sigmoid colectomy & diverting ileostomy with multiple intra-abdominal abscesses.  Pain is well controlled, with pain score of 2 this AM.  She received a total of 13.72mg  of Dilaudid in the last 24 hrs (9.6mg  basal, 4.12mg  pushes).  This AM her OGT has been d/c'd and she is receiving a CL diet with plan to advance to [facility] later today.  Goal of Therapy:  Adequate pain relief  Plan:  1.  Decrease basal rate to 0.2mg /hr (= 4.8mg /day) 2.  D/C additional order for Dilaudid prn, not necessary with PCA 3.  Monitor diet advancement- plan to change to PO pain medications when tolerating solid foods 4.  F/U in AM  Marisue Humble, PharmD Clinical Pharmacist [facility]

## 2012-03-05 NOTE — Progress Notes (Signed)
Patient ID: Vanessa [name], female   DOB: [birth year], 36 y.o.   MRN: [id number] Patient ID: Vanessa [name], female   DOB: 1975-12-17, 36 y.o.   MRN: [id number] 30 Days Post-Op  Subjective: Feeling much better today, no n/v with OGT out, tolerating clear liquids in small amounts, walking in halls, spirits much better today  Objective: Vital signs in last 24 hours: Temp:  [98.4 F (36.9 C)-99.6 F (37.6 C)] 99.6 F (37.6 C) (06/13 0400) Pulse Rate:  [97-105] 105  (06/13 0400) Resp:  [15-22] 22  (06/13 0758) BP: (113-125)/(72-84) 122/75 mmHg (06/13 0400) SpO2:  [98 %-100 %] 100 % (06/13 0758) Last BM Date: 03/04/12  Intake/Output from previous day: 06/12 0701 - 06/13 0700 In: 3050 [P.O.:40; I.V.:920; IV Piggyback:500; TPN:1540] Out: 2667.5 [Urine:1225; Emesis/NG output:1300; Drains:142.5] Intake/Output this shift:    General appearance: alert, cooperative, in good spirits Resp: clear to auscultation bilaterally Cardio: regular rate and rhythm GI: soft. minimal tenderness, +BS, ileostomy pink with output, wound granulating well  JP drains with serous output  Other drain with bilous output  EXT: calves soft  Lab Results:   Basename 03/05/12 0500 03/04/12 0440  WBC 11.0* 10.3  HGB 8.0* 8.4*  HCT 24.2* 25.2*  PLT 150 120*   BMET  Basename 03/05/12 0500 03/04/12 1550  NA 131* 132*  K 3.6 3.6  CL 97 98  CO2 26 28  GLUCOSE 109* 97  BUN 8 9  CREATININE 0.38* 0.35*  CALCIUM 10.2 10.3   PT/INR No results found for this basename: LABPROT:2,INR:2 in the last 72 hours ABG No results found for this basename: PHART:2,PCO2:2,PO2:2,HCO3:2 in the last 72 hours  Studies/Results: No results found.  Anti-infectives: Anti-infectives     Start     Dose/Rate Route Frequency Ordered Stop   03/03/12 1200   metroNIDAZOLE (FLAGYL) IVPB 500 mg        500 mg 100 mL/hr over 60 Minutes Intravenous Every 8 hours 03/03/12 0957     03/03/12 1100   ciprofloxacin (CIPRO) IVPB 400 mg         400 mg 200 mL/hr over 60 Minutes Intravenous Every 12 hours 03/03/12 0957     02/24/12 1600   levofloxacin (LEVAQUIN) IVPB 750 mg  Status:  Discontinued        750 mg 100 mL/hr over 90 Minutes Intravenous Every 24 hours 02/24/12 1526 02/26/12 1012   02/20/12 1415   cefTRIAXone (ROCEPHIN) 1 g in dextrose 5 % 50 mL IVPB  Status:  Discontinued        1 g 100 mL/hr over 30 Minutes Intravenous Every 24 hours 02/20/12 1404 02/24/12 1526   02/19/12 1800   ciprofloxacin (CIPRO) IVPB 400 mg  Status:  Discontinued        400 mg 200 mL/hr over 60 Minutes Intravenous Every 12 hours 02/19/12 1612 02/20/12 1404   02/19/12 1700   metroNIDAZOLE (FLAGYL) IVPB 500 mg  Status:  Discontinued        500 mg 100 mL/hr over 60 Minutes Intravenous 3 times per day 02/19/12 1612 02/26/12 1012   02/12/12 0800   piperacillin-tazobactam (ZOSYN) IVPB 3.375 g  Status:  Discontinued        3.375 g 12.5 mL/hr over 240 Minutes Intravenous 3 times per day 02/12/12 0800 02/19/12 1612   02/10/12 1000   amoxicillin-clavulanate (AUGMENTIN) 875-125 MG per tablet 1 tablet  Status:  Discontinued        1 tablet Oral Every 12 hours 02/10/12 0823  02/12/12 0800   02/04/12 1800   cefOXitin (MEFOXIN) 1 g in dextrose 5 % 50 mL IVPB        1 g 100 mL/hr over 30 Minutes Intravenous Every 6 hours 02/04/12 1621 02/05/12 0617   02/04/12 0000   cefOXitin (MEFOXIN) 2 g in dextrose 5 % 50 mL IVPB        2 g 100 mL/hr over 30 Minutes Intravenous 60 min pre-op 02/03/12 0333 02/04/12 1145   02/03/12 0500   cefOXitin (MEFOXIN) 2 g in dextrose 5 % 50 mL IVPB  Status:  Discontinued        2 g 100 mL/hr over 30 Minutes Intravenous 60 min pre-op 02/02/12 1156 02/03/12 0333   02/03/12 0000   cefOXitin (MEFOXIN) 2 g in dextrose 5 % 50 mL IVPB  Status:  Discontinued        2 g 100 mL/hr over 30 Minutes Intravenous 60 min pre-op 02/02/12 1004 02/02/12 1156   01/27/12 2200   cefTRIAXone (ROCEPHIN) 1 g in dextrose 5 % 50 mL IVPB         1 g 100 mL/hr over 30 Minutes Intravenous Every 24 hours 01/27/12 2152 01/29/12 2359   01/27/12 1915   cefTRIAXone (ROCEPHIN) 1 g in dextrose 5 % 50 mL IVPB        1 g 100 mL/hr over 30 Minutes Intravenous  Once 01/27/12 1914 01/27/12 1957          Assessment/Plan: s/p Procedure(s) (LRB): COLON RESECTION SIGMOID (N/A) S/P sigmoid colectomy and ileostomy with anastamotic leak Abscesses - drain repositioned and new drain placed by IR last Friday, off ABX due to thrombocytopenia, continue drain care, WBC overall stable but slight trend up today ID - off ABX as above, appreciate ID F/U Thrombocytopenia - likely med related, significant improvement, appreciate Hematology F/U Anemia ABL - better S/P PRBC, Hgb stable at 8.4 Ileus - OGT out now and tolerating this well, taking small amount of clears, will continue this today FEN - TNA, Na slightly better with IVF at [facility]  LOS: 38 days    Shiva Karis 03/05/2012

## 2012-03-05 NOTE — Progress Notes (Signed)
If clears go very well today, try fulls tonight.  Going slowly.  Patient looks much better today. Add resource breeze.  Hope to transition off TNA soon. Continue 3300 today with high RN care needs. Patient examined and I agree with the assessment and plan  Violeta Gelinas, MD, MPH, FACS Pager: [phone]  03/05/2012 8:53 AM

## 2012-03-05 NOTE — Progress Notes (Signed)
  03/05/2012, 8:01 AM  Hospital day: 34 Antibiotics: cipro/flagyl day 3   Subjective: Awake, alert, smiling, wants to get up to chair and work with PT today. No nausea since OG out yesterday. No bleeding. Pain improved, needing less pain meds. Sat up total ~ 7 hrs yesterday. Bottom still a little sore, using barrier cream and now able to use BSC instead of bedpan. Tolerating sips of water. Slept a little last pm. Has been using SCDs in bed.  Now up walking with RN and walker! Objective: Vital signs in last 24 hours: Blood pressure 122/75, pulse 105, temperature 99.6 F (37.6 C), temperature source Oral, resp. rate 22, height 5\' 4"  (1.626 m), weight 150 lb 4.8 oz (68.176 kg), SpO2 100.00%. Mucous membranes pale, oral mucosa moist, no lesions. Heart RRR. Lungs clear anteriorly/ laterally. Abdomen soft, soft dark stool in ostomy, dressings dry. LE no edema, cords, tenderness. PICC fine.  Intake/Output from previous day: 06/12 0701 - 06/13 0700 In: 3050 [P.O.:40; I.V.:920; IV Piggyback:500; TPN:1540] Out: 2667.5 [Urine:1225; Emesis/NG output:1300; Drains:142.5] Intake/Output this shift:      Lab Results:  Basename 03/05/12 0500 03/04/12 0440  WBC 11.0* 10.3  HGB 8.0* 8.4*  HCT 24.2* 25.2*  PLT 150 120*   BMET  Basename 03/05/12 0500 03/04/12 1550  NA 131* 132*  K 3.6 3.6  CL 97 98  CO2 26 28  GLUCOSE 109* 97  BUN 8 9  CREATININE 0.38* 0.35*  CALCIUM 10.2 10.3  rest of full CMET today with alb 1.7, T bili 0.6, other LFTs also normal  Studies/Results: No results found.   Assessment/Plan: 1.acute thrombocytopenia resolved. Likely medication related tho agent not definitely identified, but agree with strictly avoiding beta lactams. HIT was negative, however I would prefer SCDs and increasing activity to restarting prophylactic anticoagulation now.  2. Anemia from blood loss: hgb a little lower today may be volume related, not symptomatic enough to need more PRBCs. Will add  oral iron if able to take. 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 Post sigmoid resection with diverting loop colostomy 02-04-12. 4.intra-abdominal abscesses: percutaneous drains and back on cipro/flagyl 5.no documented recurrent cervical cancer.   Vanessa [name] P

## 2012-03-05 NOTE — Progress Notes (Signed)
Utilization review completed.  

## 2012-03-05 NOTE — Clinical Social Work Psychosocial (Signed)
     Clinical Social Work Department BRIEF PSYCHOSOCIAL ASSESSMENT 03/05/2012  Patient:  Vanessa [name], Vanessa [name]     Account Number:  [id number]     Admit date:  01/27/2012  Clinical Social Worker:  Dennison Bulla  Date/Time:  03/05/2012 02:00 PM  Referred by:  RN  Date Referred:  03/05/2012 Referred for  Other - See comment   Other Referral:   Interview type:  Patient Other interview type:    PSYCHOSOCIAL DATA Living Status:  FAMILY Admitted from facility:   Level of care:   Primary support name:  Toniann Fail Primary support relationship to patient:  FAMILY Degree of support available:   Strong    CURRENT CONCERNS Current Concerns  Financial Resources   Other Concerns:    SOCIAL WORK ASSESSMENT / PLAN CSW received referral from RN due to patient having questions regarding SSI disability. CSW met with patient at bedside. No visitors were present.    CSW introduced room and explained role. Patient reported that she had questions regarding disability but reported that her aunt had assisted her online. Patient reported that she called social security administration who completed the rest of the application via the phone. CSW spoke with patient regarding her living arrangements before admission and plans at dc. Patient reports that aunt is very supportive and will assist her at dc. Patient reports that she has several young children and that her brother sent her balloons and a figurine. Patient reported that she wants to see her family and feels that she has adequate support. CSW asked to follow up with patient on 03/06/12 to offer support and patient was agreeable.   Assessment/plan status:  Psychosocial Support/Ongoing Assessment of Needs Other assessment/ plan:   Information/referral to community resources:   SSI disability/social security administration    PATIENTS/FAMILYS RESPONSE TO PLAN OF CARE: Patient was alert and oriented. Patient was receptive to CSW consult and spoke  with CSW regarding her needs. Patient reports no current concerns but did report that CSW could follow up with her since she has not had any family visitors recently.

## 2012-03-05 NOTE — Progress Notes (Signed)
[facility] CONSULTATION NOTE  Assumption of care from PCCM / Interval History: 36 y/o F with Hx of cervical cancer s/p chemo / radiation (2012). Patient was seen in the emergency room on May 5 with abdominal pain nausea vomiting. She was diagnosed as UTI and sent home on medication. She subsequently was admitted 5/6 with abd pain and n/v. After admission was found to have a sigmoid colon stricture found on colonoscopy, CT and BE at 20 cm. Pt has a history of cervical cancer and radiation in 2011. Many week history of nausea and vomiting.  High grade stricture noted. .Colonoscopy also showed mild proctitis. BE shows 95 % narrowing at 20 cm from anal verge. She underwent colectomy and diverting ileostomy (02/04/12) and her path showed radiation changes. She underwent repeat CT 5-29 which was worrisome for anastomotic breakdown. Several abscesses were identified and she subsequently underwent percutaneous drain placement x4 in interventional radiology on 02/13/2012. By 02/19/2012 she developed bloody stools with clots, this was in association with the development of profound thrombocytopenia. Platelets were as low as less than 5. Hematology was consulted. Was felt that thrombocytopenia etiology was due to consumption but likely related to beta lactams. There was no evidence of TTP, hit or DIC. In addition to receiving platelets empiric treatment for ITP with steroids as well as IV Ig was given the patient did not have response. She also failed Amicar treatment. She has subsequently stabilized enough to be transferred out of the intensive care unit to the step down unit. Pulmonary critical care medicine had been consulted while the patient was in the intensive care unit and has now transferred medicine consultation care over to [facility] team [facility].  Subjective: Today she is alert and sitting up in a chair in good spirits. She denies abdominal pain. States she is tolerating clear liquids but is eager  to advanced to solid food. She states she is hungry. She verbalizes previous instructions from the surgical team to eat small frequent amounts of food.  Objective: Vital signs in last 24 hours: Temp:  [97.9 F (36.6 C)-99.6 F (37.6 C)] 97.9 F (36.6 C) (06/13 1146) Pulse Rate:  [97-105] 102  (06/13 0800) Resp:  [15-23] 16  (06/13 1133) BP: (112-125)/(72-84) 112/78 mmHg (06/13 0800) SpO2:  [98 %-100 %] 98 % (06/13 1133) Weight change:  Last BM Date: 03/05/12  Intake/Output from previous day: 06/12 0701 - 06/13 0700 In: 3140 [P.O.:40; I.V.:940; IV Piggyback:500; TPN:1610] Out: 2667.5 [Urine:1225; Emesis/NG output:1300; Drains:142.5] Intake/Output this shift: Total I/O In: 1010 [P.O.:720; I.V.:80; TPN:210] Out: 65 [Drains:65]  General appearance: alert, cooperative, appears stated age and no distress Resp: clear to auscultation bilaterally, room air Cardio: regular rate and rhythm, S1, S2 normal, no murmur, click, rub or gallop GI: soft, non-tender; bowel sounds normal; no masses,  no organomegaly, right abdominal ileostomy stoma is peak and small amounts of dark bilious green feculent material noted in bag, several abdominal drains some of which are leaking around the insertion site Extremities: extremities normal, atraumatic, no cyanosis or edema Neurologic: Grossly normal Other: Patient has TNA infusing  Lab Results:  Basename 03/05/12 0500 03/04/12 0440  WBC 11.0* 10.3  HGB 8.0* 8.4*  HCT 24.2* 25.2*  PLT 150 120*   BMET  Basename 03/05/12 0500 03/04/12 1550  NA 131* 132*  K 3.6 3.6  CL 97 98  CO2 26 28  GLUCOSE 109* 97  BUN 8 9  CREATININE 0.38* 0.35*  CALCIUM 10.2 10.3    Studies/Results: No  results found.  Medications: I have reviewed the patient's current medications.  Assessment/Plan:  Principal Problem:  *Sigmoid stricture/S/P sigmoid colectomy and ileostomy with anastomotic leak/IA abscesss *Per primary team/surgery  Active  Problems: Thrombocytopenia due to medication *Platelets have now normalized *Primary etiology to platelet abnormality was due to beta lactams which were discontinued *Also process related to consumption from acute septic illness and likely from gram-negative pathogens do to anastomotic leak and diffuse intra-abdominal abscesses *No evidence of ITP, HIT or true DIC *Status post transfusion of 13 units pheresed platelets this admission *Status post transfusion of 4 units FFP this admission   Ileus following gastrointestinal surgery/Nausea & vomiting/On total parenteral nutrition *Management per surgery *Currently on clears with plans to advance to full liquids later this afternoon if she tolerates *Continue parenteral nutritional support   Post-radiation anus & rectal bleeding due to colitis *Exacerbated by profound thrombocytopenia *Resolved   Acute posthemorrhagic anemia *Hemoglobin stable for 72 hours *Nadir this admission was 5.6 *Status post transfusion 27 units of packed red blood cells his admit *Baseline hemeglobin prior to this admit between 9.5 and 10 dating back to June 2012   Cervical cancer   Hypercalcemia *Was noted at time of admission on 01/27/2012 and felt at that time to be related to hydrochlorothiazide volume deplete *Calcium currently averaging between 9.5 and 10.2 *No apparent recurrence of malignancy has been identified with recent surgical procedures  Disposition *At discretion of primary surgical team *Currently in stepdown    LOS: 38 days   Junious Silk, ANP pager [phone]  [facility] [email or web address] Password: [name]  03/05/2012, 12:38 PM  I have examined the patient and reviewed the chart. I agree with the above note.   Calvert Cantor, MD [phone]

## 2012-03-06 DIAGNOSIS — M311 Thrombotic microangiopathy: Secondary | ICD-10-CM

## 2012-03-06 DIAGNOSIS — K631 Perforation of intestine (nontraumatic): Secondary | ICD-10-CM

## 2012-03-06 DIAGNOSIS — K651 Peritoneal abscess: Secondary | ICD-10-CM

## 2012-03-06 DIAGNOSIS — K625 Hemorrhage of anus and rectum: Secondary | ICD-10-CM

## 2012-03-06 LAB — BASIC METABOLIC PANEL
BUN: 7 mg/dL (ref 6–23)
BUN: 8 mg/dL (ref 6–23)
CO2: 25 mEq/L (ref 19–32)
Calcium: 9.8 mg/dL (ref 8.4–10.5)
Glucose, Bld: 96 mg/dL (ref 70–99)
Potassium: 4.5 mEq/L (ref 3.5–5.1)
Potassium: 3.6 mEq/L (ref 3.5–5.1)
Sodium: 132 mEq/L — ABNORMAL LOW (ref 135–145)

## 2012-03-06 MED ORDER — WHITE PETROLATUM GEL
Status: AC
  Administered 2012-03-06: 01:00:00
  Filled 2012-03-06: qty 5

## 2012-03-06 MED ORDER — ALTEPLASE 100 MG IV SOLR
2.0000 mg | Freq: Once | INTRAVENOUS | Status: AC
  Administered 2012-03-06: 2 mg
  Filled 2012-03-06: qty 2

## 2012-03-06 MED ORDER — LIDOCAINE 4 % EX CREA
TOPICAL_CREAM | Freq: Three times a day (TID) | CUTANEOUS | Status: DC
  Administered 2012-03-06 – 2012-03-07 (×3): 1 via TOPICAL
  Administered 2012-03-07: 16:00:00 via TOPICAL
  Administered 2012-03-07 – 2012-03-08 (×2): 1 via TOPICAL
  Administered 2012-03-08: 14:00:00 via TOPICAL
  Filled 2012-03-06 (×28): qty 5

## 2012-03-06 MED ORDER — ZINC TRACE METAL 1 MG/ML IV SOLN
INTRAVENOUS | Status: AC
  Administered 2012-03-06: 17:00:00 via INTRAVENOUS
  Filled 2012-03-06: qty 2000

## 2012-03-06 NOTE — Progress Notes (Signed)
MEDICATION RELATED CONSULT NOTE - FOLLOW UP   Pharmacy Consult for Dilaudid PCA Indication: Pain consult   Allergies  Allergen Reactions  . Labetalol Hcl Itching and Other (See Comments)    Bumps to bilateral arms.  . Morphine And Related Hives    Patient Measurements: Height: 5\' 4"  (162.6 cm) Weight: 150 lb 4.8 oz (68.176 kg) IBW/kg (Calculated) : 54.7   Vital Signs: Temp: 99 F (37.2 C) (06/14 0742) Temp src: Oral (06/14 0742) BP: 123/79 mmHg (06/14 0742) Pulse Rate: 108  (06/14 0355) Intake/Output from previous day: 06/13 0701 - 06/14 0700 In: 3340 [P.O.:840; I.V.:480; IV Piggyback:300; TPN:1680] Out: 965 [Urine:800; Drains:165] Intake/Output from this shift: Total I/O In: -  Out: 40 [Drains:40]  Labs:  [facility] 03/05/12 0500 03/04/12 1550 03/04/12 0440  WBC 11.0* -- 10.3  HGB 8.0* -- 8.4*  HCT 24.2* -- 25.2*  PLT 150 -- 120*  APTT -- -- --  CREATININE 0.38* 0.35* 0.41*  LABCREA -- -- --  CREATININE 0.38* 0.35* 0.41*  CREAT24HRUR -- -- --  MG 1.8 -- 1.9  PHOS 3.4 -- 3.4  ALBUMIN 1.7* -- --  PROT 6.0 -- --  ALBUMIN 1.7* -- --  AST 15 -- --  ALT 14 -- --  ALKPHOS 103 -- --  BILITOT 0.6 -- --  BILIDIR -- -- --  IBILI -- -- --   Estimated Creatinine Clearance: 92.2 ml/min (by C-G formula based on Cr of 0.38).   Medications:  Scheduled:     . alteplase  2 mg Intracatheter Once  . antiseptic oral rinse  15 mL Mouth Rinse QID  . barrier cream  1 application Topical TID  . chlorhexidine  15 mL Mouth/Throat BID  . ciprofloxacin  400 mg Intravenous Q12H  . feeding supplement  1 Container Oral TID BM  . ferrous fumarate  1 tablet Oral Daily  . folic acid  1 mg Oral Daily  . gatorade (BH)  240 mL Oral TID  . HYDROmorphone PCA 0.3 mg/mL   Intravenous Q4H  . lidocaine   Topical TID  . metoCLOPramide (REGLAN) injection  10 mg Intravenous Q8H  . metronidazole  500 mg Intravenous Q8H  . prochlorperazine  5 mg Intravenous QID  . sodium chloride  10-40 mL  Intracatheter Q12H  . white petrolatum      . DISCONTD: HYDROmorphone PCA 0.3 mg/mL   Intravenous Q4H    Assessment: 36yo female with h/o cervical cancer & radiation, s/p sigmoid colectomy & diverting ileostomy with multiple intra-abdominal abscesses.  Pain is controlled following reduction of basal rate.  However, demand dose usage did increase.  She received a total of 17.6mg  of Dilaudid in the last 24 hrs (4.8mg  basal, 12.8mg  demand).  Pt states she is hungry and hopeful to further advance diet and wean TPN today.  Goal of Therapy:  Adequate pain relief  Plan:  1.  Will not adjust basal PCA rate at this time since demand dosing and pain score have increased. 2.  Reevaluate pain control in AM and hope to d/c basal rate. 3.  Monitor diet advancement- plan to change to PO pain medications when tolerating solid foods 4.  F/U in AM  [facility], Pharm.D., BCPS Clinical Pharmacist Pager [phone] 03/06/2012 9:27 AM

## 2012-03-06 NOTE — Progress Notes (Signed)
PT Cancellation Note  Treatment cancelled today due to pt declining to participate secondary to feeling too tired to move.  Will try back another time.    Sunny Schlein, [place] [phone] 03/06/2012, 11:44 AM

## 2012-03-06 NOTE — Progress Notes (Signed)
Hematology/ Medical Oncology  Hospital day 40 Cipro / flagyl day 4  She is sleeping soundly now, no family present. I have spoken with RN, who reports no bleeding and no problems overnight. VItals all stable,   No labs including CBC this am.   Assessment/Recommendations:  1. Thrombocytopenia: medication related +/- consumption with intra abdominal abscesses. Resolved as of labs yesterday. Avoid B lactams. Should be using SCDs when not walking. Does not need repeat Nplate this weekend. 2.acute blood loss anemia: from rectal bleeding with RT proctitis and epistaxis while profoundly thrombocytopenic. Oral iron if tolerates. 3.history of cervical cancer, post RT with sensitizing cisplatin in late 2011, not known active. 4.bowel stricture and necrosis and proctitis related to #3, post sigmoid colectomy and colostomy 02-04-12.  5.intra abdominal abscesses, percutaneous drains in place and ID following. Requiring less IV dilaudid. 6.on TNA  Please call over weekend if our service can assist. I will follow up next week.  Jama Flavors, MD [phone]

## 2012-03-06 NOTE — Progress Notes (Signed)
Clinical Social Work  CSW attempted to meet with patient to offer support. Patient was sleeping and unable to be aroused. CSW will follow up at another time.  [place], [place] [phone]

## 2012-03-06 NOTE — Progress Notes (Addendum)
Tolerating fulls well - keep her on fulls for now.  Does not like [employer] much.  Likely worth seeing prealbumin Monday prior to D/C TNA. Add lidocaine cream to barrier cream to buttock area. Drain with bag still tan liquid. JPs serous. Transfer to 5100 Patient examined and I agree with the assessment and plan  Violeta Gelinas, MD, MPH, FACS Pager: [phone]  03/06/2012 9:04 AM

## 2012-03-06 NOTE — Progress Notes (Signed)
Patient ID: Vanessa [name], female   DOB: 07-21-1976, 36 y.o.   MRN: [id number]  Subjective: Continues to improve, ostomy with good output, abd sore but well controlled-only using PCA minimally, tolerating diet well, denies n/v  Objective: Vital signs in last 24 hours: Temp:  [97.9 F (36.6 C)-99 F (37.2 C)] 99 F (37.2 C) (06/14 0742) Pulse Rate:  [96-108] 108  (06/14 0355) Resp:  [16-28] 23  (06/14 0356) BP: (105-125)/(74-86) 124/85 mmHg (06/14 0355) SpO2:  [98 %-100 %] 100 % (06/14 0356) Last BM Date: 03/05/12  Intake/Output from previous day: 06/13 0701 - 06/14 0700 In: 3340 [P.O.:840; I.V.:480; IV Piggyback:300; TPN:1680] Out: 965 [Urine:800; Drains:165] Intake/Output this shift: Total I/O In: -  Out: 40 [Drains:40]  General appearance: alert, cooperative, in good spirits Resp: clear to auscultation bilaterally Cardio: regular rate and rhythm GI: soft. minimal tenderness, +BS, ileostomy pink with output, wound granulating well  JP drains with serous output  Other drain with bilous output  EXT: calves soft  Lab Results:   Basename 03/05/12 0500 03/04/12 0440  WBC 11.0* 10.3  HGB 8.0* 8.4*  HCT 24.2* 25.2*  PLT 150 120*   BMET  Basename 03/05/12 0500 03/04/12 1550  NA 131* 132*  K 3.6 3.6  CL 97 98  CO2 26 28  GLUCOSE 109* 97  BUN 8 9  CREATININE 0.38* 0.35*  CALCIUM 10.2 10.3   PT/INR No results found for this basename: LABPROT:2,INR:2 in the last 72 hours ABG No results found for this basename: PHART:2,PCO2:2,PO2:2,HCO3:2 in the last 72 hours  Studies/Results: No results found.  Anti-infectives: Anti-infectives     Start     Dose/Rate Route Frequency Ordered Stop   03/03/12 1200   metroNIDAZOLE (FLAGYL) IVPB 500 mg        500 mg 100 mL/hr over 60 Minutes Intravenous Every 8 hours 03/03/12 0957     03/03/12 1100   ciprofloxacin (CIPRO) IVPB 400 mg        400 mg 200 mL/hr over 60 Minutes Intravenous Every 12 hours 03/03/12 0957      02/24/12 1600   levofloxacin (LEVAQUIN) IVPB 750 mg  Status:  Discontinued        750 mg 100 mL/hr over 90 Minutes Intravenous Every 24 hours 02/24/12 1526 02/26/12 1012   02/20/12 1415   cefTRIAXone (ROCEPHIN) 1 g in dextrose 5 % 50 mL IVPB  Status:  Discontinued        1 g 100 mL/hr over 30 Minutes Intravenous Every 24 hours 02/20/12 1404 02/24/12 1526   02/19/12 1800   ciprofloxacin (CIPRO) IVPB 400 mg  Status:  Discontinued        400 mg 200 mL/hr over 60 Minutes Intravenous Every 12 hours 02/19/12 1612 02/20/12 1404   02/19/12 1700   metroNIDAZOLE (FLAGYL) IVPB 500 mg  Status:  Discontinued        500 mg 100 mL/hr over 60 Minutes Intravenous 3 times per day 02/19/12 1612 02/26/12 1012   02/12/12 0800   piperacillin-tazobactam (ZOSYN) IVPB 3.375 g  Status:  Discontinued        3.375 g 12.5 mL/hr over 240 Minutes Intravenous 3 times per day 02/12/12 0800 02/19/12 1612   02/10/12 1000   amoxicillin-clavulanate (AUGMENTIN) 875-125 MG per tablet 1 tablet  Status:  Discontinued        1 tablet Oral Every 12 hours 02/10/12 0823 02/12/12 0800   02/04/12 1800   cefOXitin (MEFOXIN) 1 g in dextrose 5 % 50  mL IVPB        1 g 100 mL/hr over 30 Minutes Intravenous Every 6 hours 02/04/12 1621 02/05/12 0617   02/04/12 0000   cefOXitin (MEFOXIN) 2 g in dextrose 5 % 50 mL IVPB        2 g 100 mL/hr over 30 Minutes Intravenous 60 min pre-op 02/03/12 0333 02/04/12 1145   02/03/12 0500   cefOXitin (MEFOXIN) 2 g in dextrose 5 % 50 mL IVPB  Status:  Discontinued        2 g 100 mL/hr over 30 Minutes Intravenous 60 min pre-op 02/02/12 1156 02/03/12 0333   02/03/12 0000   cefOXitin (MEFOXIN) 2 g in dextrose 5 % 50 mL IVPB  Status:  Discontinued        2 g 100 mL/hr over 30 Minutes Intravenous 60 min pre-op 02/02/12 1004 02/02/12 1156   01/27/12 2200   cefTRIAXone (ROCEPHIN) 1 g in dextrose 5 % 50 mL IVPB        1 g 100 mL/hr over 30 Minutes Intravenous Every 24 hours 01/27/12 2152 01/29/12 2359    01/27/12 1915   cefTRIAXone (ROCEPHIN) 1 g in dextrose 5 % 50 mL IVPB        1 g 100 mL/hr over 30 Minutes Intravenous  Once 01/27/12 1914 01/27/12 1957          Assessment/Plan: s/p Procedure(s) (LRB): COLON RESECTION SIGMOID (N/A) S/P sigmoid colectomy and ileostomy with anastamotic leak Abscesses - Drains with serous op, off ABX due to thrombocytopenia, continue drain care, WBC overall stable  ID - off ABX as above, appreciate ID F/U Thrombocytopenia - likely med related, significant improvement, appreciate Hematology F/U Anemia ABL - better S/P PRBC, Hgb stable Ileus - OGT out now and tolerating this well, ostomy functioning well, full liquids today, can consider starting PO pain meds and begin weaning PCA. FEN - TNA, start weaning this with the increase in PO intake.  DISPO: arrangements being made for home health ostomy nurse, aide and PT, will begin weaning PCA and TNA in hopes of discharge home soon.  LOS: 39 days    Kemaria Dedic 03/06/2012

## 2012-03-06 NOTE — Progress Notes (Signed)
[facility] CONSULTATION NOTE [place] TEAM 1 - Stepdown/ICU TEAM  Interval History: 36 y/o F with Hx of cervical cancer s/p chemo / radiation (2011). Patient was seen in the emergency room on May 5 with abdominal pain nausea vomiting. She was diagnosed as UTI and sent home on medication. She subsequently was admitted 5/6 with abd pain and n/v. After admission was found to have a sigmoid colon stricture found on colonoscopy, CT and BE at 20 cm. Pt has a history of cervical cancer and radiation in 2011. Many week history of nausea and vomiting.  High grade stricture noted. .Colonoscopy also showed mild proctitis. BE shows 95 % narrowing at 20 cm from anal verge. She underwent colectomy and diverting ileostomy (02/04/12) and her path showed radiation changes. She underwent repeat CT 5-29 which was worrisome for anastomotic breakdown. Several abscesses were identified and she subsequently underwent percutaneous drain placement x4 in interventional radiology on 02/13/2012. By 02/19/2012 she developed bloody stools with clots, this was in association with the development of profound thrombocytopenia. Platelets were as low as less than 5. Hematology was consulted. Was felt that thrombocytopenia etiology was due to consumption but likely related to beta lactams. There was no evidence of TTP, hit or DIC. In addition to receiving platelets empiric treatment for ITP with steroids as well as IV Ig was given the patient did not have response. She also failed Amicar treatment. She has subsequently stabilized enough to be transferred out of the intensive care unit to the step down unit. Pulmonary critical care medicine had been consulted while the patient was in the intensive care unit and has now transferred medicine consultation care over to [facility] team [facility].  Subjective: Getting up with assistance to ambulate to the bathroom. No complaints of abdominal pain. Tolerated advanced to full liquid  diet.  Objective: Blood pressure 121/88, pulse 94, temperature 98.2 F (36.8 C), temperature source Oral, resp. rate 20, height 5\' 4"  (1.626 m), weight 68.176 kg (150 lb 4.8 oz), SpO2 98.00%.  Intake/Output from previous day: 06/13 0701 - 06/14 0700 In: 3340 [P.O.:840; I.V.:480; IV Piggyback:300; TPN:1680] Out: 965 [Urine:800; Drains:165] Intake/Output this shift: Total I/O In: -  Out: 40 [Drains:40]  General appearance: alert, cooperative, appears stated age and no distress Resp: clear to auscultation bilaterally w/o wheeze Cardio: regular rate and rhythm, S1, S2 normal, no murmur, click, rub or gallop GI: soft, non-tender; bowel sounds normal; no masses,  no organomegaly, right abdominal ileostomy stoma Extremities: extremities normal, atraumatic, no cyanosis or edema Neurologic: Grossly normal  Lab Results:  Basename 03/05/12 0500 03/04/12 0440  WBC 11.0* 10.3  HGB 8.0* 8.4*  HCT 24.2* 25.2*  PLT 150 120*   BMET  Basename 03/06/12 1050 03/06/12 0830  NA 132* 128*  K 3.6 4.5  CL 99 95*  CO2 25 26  GLUCOSE 96 708*  BUN 8 7  CREATININE 0.36* 0.40*  CALCIUM 10.0 9.8   Medications: I have reviewed the patient's current medications.  Assessment/Plan:  Sigmoid stricture/S/P sigmoid colectomy and ileostomy with anastomotic leak/IA abscesss *Per primary team - General Surgery  Thrombocytopenia due to medication *Platelets have now normalized *Primary etiology felt to be beta lactams which were discontinued *Also process related to consumption from acute septic illness and likely from gram-negative pathogens due diffuse intra-abdominal abscesses *No evidence of ITP, HIT or true DIC *Status post transfusion of 13 units pheresed platelets this admission *Status post transfusion of 4 units FFP this admission  Ileus following gastrointestinal surgery/Nausea &  vomiting/On total parenteral nutrition *Management per Gen Surgery  Post-radiation rectal bleeding due to  colitis *Exacerbated by profound thrombocytopenia *Resolved  Acute posthemorrhagic anemia *Hemoglobin stable for 72 hours *Nadir this admission was 5.6 *Status post transfusion 27 units of packed red blood cells his admit *Baseline hemaglobin prior to this admit between 9.5 and 10 dating back to June 2012  Cervical cancer  Hypercalcemia *Was noted at time of admission on 01/27/2012 and felt at that time to be related to hydrochlorothiazide + volume deplete *Calcium currently averaging between 9.5 and 10.2 *No apparent recurrence of malignancy has been identified with recent surgical procedures  Disposition *At discretion of primary surgical team *Currently in stepdown but from a medical standpoint she is appropriate to transfer to the surgical floor *[name] will continue to follow as a Medical Consult   LOS: 39 days   Junious Silk, ANP pager [phone]  [facility] [email or web address] Password: [name]  03/06/2012, 11:45 AM  I have personally examined this patient and reviewed the entire database. I have reviewed the above note, made any necessary editorial changes, and agree with its content.  Lonia Blood, MD [facility]

## 2012-03-06 NOTE — Progress Notes (Signed)
Patient being transferred to [id number] via wheelchair. Phone report called to Lindsay,rn. Patient made aware of the transfer. Patient alittle upset about leaving the unit

## 2012-03-06 NOTE — Progress Notes (Signed)
PARENTERAL NUTRITION CONSULT NOTE - FOLLOW UP  Pharmacy Consult for TPN Indication: intraabdominal abscess, ileus  Allergies  Allergen Reactions  . Labetalol Hcl Itching and Other (See Comments)    Bumps to bilateral arms.  . Morphine And Related Hives   Patient Measurements: Height: 5\' 4"  (162.6 cm) Weight: 150 lb 4.8 oz (68.176 kg) IBW/kg (Calculated) : 54.7   Vital Signs: Temp: 98.7 F (37.1 C) (06/14 0355) Temp src: Oral (06/14 0355) BP: 124/85 mmHg (06/14 0355) Pulse Rate: 108  (06/14 0355) Intake/Output from previous day: 06/13 0701 - 06/14 0700 In: 3340 [P.O.:840; I.V.:480; IV Piggyback:300; TPN:1680] Out: 965 [Urine:800; Drains:165] Intake/Output from this shift:  Labs:  Basename 03/05/12 0500 03/04/12 0440  WBC 11.0* 10.3  HGB 8.0* 8.4*  HCT 24.2* 25.2*  PLT 150 120*  APTT -- --  INR -- --    Basename 03/05/12 0500 03/04/12 1550 03/04/12 0440  NA 131* 132* 133*  K 3.6 3.6 3.5  CL 97 98 98  CO2 26 28 27   GLUCOSE 109* 97 106*  BUN 8 9 9   CREATININE 0.38* 0.35* 0.41*  LABCREA -- -- --  CREAT24HRUR -- -- --  CALCIUM 10.2 10.3 10.2  MG 1.8 -- 1.9  PHOS 3.4 -- 3.4  PROT 6.0 -- --  ALBUMIN 1.7* -- --  AST 15 -- --  ALT 14 -- --  ALKPHOS 103 -- --  BILITOT 0.6 -- --  BILIDIR -- -- --  IBILI -- -- --  PREALBUMIN -- -- --  TRIG -- -- --  CHOLHDL -- -- --  CHOL -- -- --   Estimated Creatinine Clearance: 92.2 ml/min (by C-G formula based on Cr of 0.38).  No results found for this basename: GLUCAP:3 in the last 72 hours Insulin Requirements in the past 24 hours:  SSI, CBG checks d/c'd d/t minimal SSI needs.  NO insulin in TNA    Current Nutrition:  - Clinimix E5/20 at 70 ml/hr (at goal) - Avoiding lipids due to possible contribution to thrombocytopenia.   - NPO  Nutritional Goals:  1800-2000 kCal, 80-95 grams of protein per day.    Clinimix E5/20 goal rate of 37ml/hr will provide an average of 1478 kcal and 84g protein per day based on RD  assessment (82% total kcal and 100% protein needs)  Assessment: 89 yof with a history of cervical cancer who is s/p sigmoid colectomy and diverting ileostomy on 5/14.   Developed multiple intra-abdominal abscesses d/t anastomotic leak and had 3 perc drains placed.  5/29 patient had multiple blood BM, hemoptysis, and abdominal pain. CT 5/30 showed additional abscesses and concern for fistula formation. CT 6/6: persistent abscesses. New periotoneal drain placed by IR 6/7. Plts dropped to <5 (240k on 5/25) but have now improved to 150  GI:  Advanced to full liquid diet with resource TID BM and gatorade TID. Thrombocytopenia resolved with no further epistaxis or rectal bleeding. OR plans deferred d/t significant improvement. 4 abdominal drains in place. OG tube removed 6/12 -continued on scheduled reglan/compazine. Off PPI and H2RA d/t thrombocytopenia risk.  Endo: No hx DM. CBG controlled. SSI, CBG checks d/c'd 6/10.   Lytes:  Na low - ?Fluid o/load. IVF at [facility]. K/Mag stable.  Corr Ca 12.04. Other lytes ok.  Acid-base status ok.  Renal: Na continues to trend down, other lytes OK. SCr good, UOP 0.5 ml/kg/hr.  Positive balance.    Pulm: RA  Cards:Hemodynamically stable  Hepatobil: Alk phos/LFTs/bili wnl. Albumin remains low.  Triglycerides  and cholesterol WNL, prealbumin trending up, WNL  Neuro: pain controlled on dilaudid PCA   Heme/Onc: Thrombocytopenia resolved with steroids, IVIG, N-Plate, tranexamic acid, amicar, and discontinuation of multiple offending meds. Noted several blood product transfusions to date.  Hgb continues trending down. Ferrous fumarate added.    ID:D#4 cipro/flagyl - restarted for peritoneal abscesses with polymicrobial bacteria from 6/7 cx and  leukocytosis; CCM stopped all abx 6/4 d/t potential for inducing thrombocytopenia;  WBC stable; afebrile.  ID continues to refrain from using beta-lactams d/t potential cause of thrombocytopenia.   Best Practices: SCDs.     Plan:  - Continue Clinimix E 5/20 at goal of 70 ml/hr.  If po intake increases, could consider decreasing rate of TPN.   - MVI and Trace elements MWF only d/t national shortage - Fat emulsion omitted d/t to chance of worsening thrombocytopenia. Risk of EFAD no longer significant concern now that pt on full liquid diet.   - f/u AM labs, monitor Mag, K+ trend - f/u advancing diet plans and wean of TNA  Haynes Hoehn, PharmD 03/06/2012 7:41 AM  Pager: [phone]

## 2012-03-07 DIAGNOSIS — K631 Perforation of intestine (nontraumatic): Secondary | ICD-10-CM

## 2012-03-07 DIAGNOSIS — K625 Hemorrhage of anus and rectum: Secondary | ICD-10-CM

## 2012-03-07 DIAGNOSIS — M311 Thrombotic microangiopathy: Secondary | ICD-10-CM

## 2012-03-07 DIAGNOSIS — K651 Peritoneal abscess: Secondary | ICD-10-CM

## 2012-03-07 LAB — CBC
HCT: 24 % — ABNORMAL LOW (ref 36.0–46.0)
Hemoglobin: 8 g/dL — ABNORMAL LOW (ref 12.0–15.0)
MCH: 31.4 pg (ref 26.0–34.0)
MCHC: 33.3 g/dL (ref 30.0–36.0)
MCV: 94.1 fL (ref 78.0–100.0)
RDW: 21.3 % — ABNORMAL HIGH (ref 11.5–15.5)

## 2012-03-07 LAB — BASIC METABOLIC PANEL
BUN: 7 mg/dL (ref 6–23)
Chloride: 99 mEq/L (ref 96–112)
Creatinine, Ser: 0.37 mg/dL — ABNORMAL LOW (ref 0.50–1.10)
Glucose, Bld: 119 mg/dL — ABNORMAL HIGH (ref 70–99)
Potassium: 3.2 mEq/L — ABNORMAL LOW (ref 3.5–5.1)

## 2012-03-07 MED ORDER — HYDROMORPHONE HCL PF 1 MG/ML IJ SOLN
0.5000 mg | INTRAMUSCULAR | Status: DC | PRN
  Administered 2012-03-07 (×2): 0.5 mg via INTRAVENOUS
  Filled 2012-03-07 (×2): qty 1

## 2012-03-07 MED ORDER — HYDROMORPHONE HCL PF 1 MG/ML IJ SOLN
1.0000 mg | INTRAMUSCULAR | Status: DC | PRN
  Administered 2012-03-07 – 2012-03-12 (×30): 1 mg via INTRAVENOUS
  Filled 2012-03-07 (×31): qty 1

## 2012-03-07 MED ORDER — CLINIMIX E/DEXTROSE (5/20) 5 % IV SOLN
INTRAVENOUS | Status: AC
  Administered 2012-03-07: 18:00:00 via INTRAVENOUS
  Filled 2012-03-07: qty 2000

## 2012-03-07 MED ORDER — HYDROMORPHONE HCL 2 MG PO TABS
1.0000 mg | ORAL_TABLET | ORAL | Status: DC | PRN
  Administered 2012-03-07: 1 mg via ORAL
  Filled 2012-03-07: qty 1

## 2012-03-07 MED ORDER — OXYCODONE HCL 20 MG PO TB12
20.0000 mg | ORAL_TABLET | Freq: Two times a day (BID) | ORAL | Status: DC
  Administered 2012-03-07 – 2012-03-12 (×10): 20 mg via ORAL
  Filled 2012-03-07 (×10): qty 1

## 2012-03-07 MED ORDER — HYDROMORPHONE HCL PF 1 MG/ML IJ SOLN
1.5000 mg | INTRAMUSCULAR | Status: AC
  Administered 2012-03-07: 1.5 mg via INTRAVENOUS
  Filled 2012-03-07: qty 2

## 2012-03-07 MED ORDER — POTASSIUM CHLORIDE 20 MEQ/15ML (10%) PO LIQD
40.0000 meq | ORAL | Status: DC
  Administered 2012-03-07: 40 meq via ORAL
  Filled 2012-03-07 (×2): qty 30

## 2012-03-07 MED ORDER — POTASSIUM CHLORIDE 10 MEQ/50ML IV SOLN
10.0000 meq | INTRAVENOUS | Status: AC
  Administered 2012-03-07 (×6): 10 meq via INTRAVENOUS
  Filled 2012-03-07 (×6): qty 50

## 2012-03-07 NOTE — Progress Notes (Addendum)
MEDICATION RELATED CONSULT NOTE - FOLLOW UP   Pharmacy Consult for pain management Indication: Pain consult   Allergies  Allergen Reactions  . Labetalol Hcl Itching and Other (See Comments)    Bumps to bilateral arms.  . Morphine And Related Hives    Patient Measurements: Height: 5\' 4"  (162.6 cm) Weight: 150 lb 4.8 oz (68.176 kg) IBW/kg (Calculated) : 54.7   Vital Signs: Temp: 98.4 F (36.9 C) (06/15 1430) Temp src: Oral (06/15 1430) BP: 159/99 mmHg (06/15 1430) Pulse Rate: 82  (06/15 1430)  Assessment: 36yo female with h/o cervical cancer & radiation, s/p sigmoid colectomy & diverting ileostomy with multiple intra-abdominal abscesses.   Pain score has improved slightly, dilaudid use decreased to 8.4 mg over the last 24 hours (4.8mg  basal, 3.6mg  demand). Rn called tonight stating that the pt is complaining of 9/10 pain. She is off PCA now. PO and IV prn dilaudid doesn't seem to help much. I'll start her on scheduled PO pain med and increase her PRN dilaudid.   Goal of Therapy:  Adequate pain relief  Plan:  1. Dilaudid 1.5mg  IV now 2. Oxycontin 20mg  PO bid 3. Increase dilaudid to 1mg  IV q2h prn

## 2012-03-07 NOTE — Progress Notes (Signed)
MEDICATION RELATED CONSULT NOTE - FOLLOW UP   Pharmacy Consult for Dilaudid PCA Indication: Pain consult   Allergies  Allergen Reactions  . Labetalol Hcl Itching and Other (See Comments)    Bumps to bilateral arms.  . Morphine And Related Hives    Patient Measurements: Height: 5\' 4"  (162.6 cm) Weight: 150 lb 4.8 oz (68.176 kg) IBW/kg (Calculated) : 54.7   Vital Signs: Temp: 98.3 F (36.8 C) (06/15 0620) Temp src: Oral (06/15 0620) BP: 138/52 mmHg (06/15 0620) Pulse Rate: 79  (06/15 1610)  Assessment: 36yo female with h/o cervical cancer & radiation, s/p sigmoid colectomy & diverting ileostomy with multiple intra-abdominal abscesses.   Pain score has improved slightly, dilaudid use decreased to 8.4 mg over the last 24 hours (4.8mg  basal, 3.6mg  demand). Spoke with RN who informed me that the patient has only received her basal dose over the past 4 h, and has not been using demand doses. She is also refusing the CO2 monitor. She is tolerating her full liquid diet.  Goal of Therapy:  Adequate pain relief  Plan:  1. Will d/c PCA and start Dilaudid 1mg  PO q 2h prn, will have Dilaudid 0.5mg  IV q 2h prn available if patient cannot tolerate PO, although now expect minimal IV use as patient is tolerating diet. 2. Will sign off pain consult and f/up peripherally- please reconsult if needed.  Thanks, Jamae Tison K. Allena Katz, PharmD, BCPS.  Clinical Pharmacist Pager [phone]. 03/07/2012 8:29 AM

## 2012-03-07 NOTE — Progress Notes (Signed)
32 Days Post-Op  Subjective: Some nausea with oral potassium, +BM x2 from rectum, pain controlled  Objective: Vital signs in last 24 hours: Temp:  [98.2 F (36.8 C)-98.8 F (37.1 C)] 98.3 F (36.8 C) (06/15 0620) Pulse Rate:  [79-94] 79  (06/15 0620) Resp:  [18-22] 20  (06/15 0800) BP: (121-138)/(52-88) 138/52 mmHg (06/15 0620) SpO2:  [96 %-100 %] 100 % (06/15 0800) FiO2 (%):  [100 %] 100 % (06/14 1218) Last BM Date: 03/06/12  Intake/Output from previous day: 06/14 0701 - 06/15 0700 In: 630 [TPN:630] Out: 1247.5 [Drains:222.5; Stool:1025] Intake/Output this shift:    General appearance: alert, cooperative and no distress GI: soft, no significant tenderness, mild distension with some upper abdominal tympany, no peritoneal signs, drains with serous output except for drainage bag with feculent material, she has some purulent fluid draining from her lower midline wound as well.  Lab Results:   Basename 03/07/12 0555 03/05/12 0500  WBC 9.4 11.0*  HGB 8.0* 8.0*  HCT 24.0* 24.2*  PLT 191 150   BMET  Basename 03/07/12 0555 03/06/12 1050  NA 133* 132*  K 3.2* 3.6  CL 99 99  CO2 27 25  GLUCOSE 119* 96  BUN 7 8  CREATININE 0.37* 0.36*  CALCIUM 10.0 10.0   PT/INR No results found for this basename: LABPROT:2,INR:2 in the last 72 hours ABG No results found for this basename: PHART:2,PCO2:2,PO2:2,HCO3:2 in the last 72 hours  Studies/Results: No results found.  Anti-infectives: Anti-infectives     Start     Dose/Rate Route Frequency Ordered Stop   03/03/12 1200   metroNIDAZOLE (FLAGYL) IVPB 500 mg        500 mg 100 mL/hr over 60 Minutes Intravenous Every 8 hours 03/03/12 0957     03/03/12 1100   ciprofloxacin (CIPRO) IVPB 400 mg        400 mg 200 mL/hr over 60 Minutes Intravenous Every 12 hours 03/03/12 0957     02/24/12 1600   levofloxacin (LEVAQUIN) IVPB 750 mg  Status:  Discontinued        750 mg 100 mL/hr over 90 Minutes Intravenous Every 24 hours 02/24/12  1526 02/26/12 1012   02/20/12 1415   cefTRIAXone (ROCEPHIN) 1 g in dextrose 5 % 50 mL IVPB  Status:  Discontinued        1 g 100 mL/hr over 30 Minutes Intravenous Every 24 hours 02/20/12 1404 02/24/12 1526   02/19/12 1800   ciprofloxacin (CIPRO) IVPB 400 mg  Status:  Discontinued        400 mg 200 mL/hr over 60 Minutes Intravenous Every 12 hours 02/19/12 1612 02/20/12 1404   02/19/12 1700   metroNIDAZOLE (FLAGYL) IVPB 500 mg  Status:  Discontinued        500 mg 100 mL/hr over 60 Minutes Intravenous 3 times per day 02/19/12 1612 02/26/12 1012   02/12/12 0800   piperacillin-tazobactam (ZOSYN) IVPB 3.375 g  Status:  Discontinued        3.375 g 12.5 mL/hr over 240 Minutes Intravenous 3 times per day 02/12/12 0800 02/19/12 1612   02/10/12 1000   amoxicillin-clavulanate (AUGMENTIN) 875-125 MG per tablet 1 tablet  Status:  Discontinued        1 tablet Oral Every 12 hours 02/10/12 0823 02/12/12 0800   02/04/12 1800   cefOXitin (MEFOXIN) 1 g in dextrose 5 % 50 mL IVPB        1 g 100 mL/hr over 30 Minutes Intravenous Every 6 hours 02/04/12 1621  02/05/12 0617   02/04/12 0000   cefOXitin (MEFOXIN) 2 g in dextrose 5 % 50 mL IVPB        2 g 100 mL/hr over 30 Minutes Intravenous 60 min pre-op 02/03/12 0333 02/04/12 1145   02/03/12 0500   cefOXitin (MEFOXIN) 2 g in dextrose 5 % 50 mL IVPB  Status:  Discontinued        2 g 100 mL/hr over 30 Minutes Intravenous 60 min pre-op 02/02/12 1156 02/03/12 0333   02/03/12 0000   cefOXitin (MEFOXIN) 2 g in dextrose 5 % 50 mL IVPB  Status:  Discontinued        2 g 100 mL/hr over 30 Minutes Intravenous 60 min pre-op 02/02/12 1004 02/02/12 1156   01/27/12 2200   cefTRIAXone (ROCEPHIN) 1 g in dextrose 5 % 50 mL IVPB        1 g 100 mL/hr over 30 Minutes Intravenous Every 24 hours 01/27/12 2152 01/29/12 2359   01/27/12 1915   cefTRIAXone (ROCEPHIN) 1 g in dextrose 5 % 50 mL IVPB        1 g 100 mL/hr over 30 Minutes Intravenous  Once 01/27/12 1914 01/27/12  1957          Assessment/Plan: s/p Procedure(s) (LRB): COLON RESECTION SIGMOID (N/A) tolerating full liquids but given abdominal distension and tympany, would not advance diet yet.  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  other drains may be able to go soon.  wound care  LOS: 40 days    Lodema Pilot DAVID 03/07/2012

## 2012-03-07 NOTE — Progress Notes (Signed)
[name] f/u internal medicine consult   Interval History: 36 y/o F with Hx of cervical cancer s/p chemo / radiation (2011). Patient was seen in the emergency room on May 5 with abdominal pain nausea vomiting. She was diagnosed as UTI and sent home on medication. She subsequently was admitted 5/6 with abd pain and n/v. After admission was found to have a sigmoid colon stricture found on colonoscopy, CT and BE at 20 cm. Pt has a history of cervical cancer and radiation in 2011. Many week history of nausea and vomiting.  High grade stricture noted. .Colonoscopy also showed mild proctitis. BE shows 95 % narrowing at 20 cm from anal verge. She underwent colectomy and diverting ileostomy (02/04/12) and her path showed radiation changes. She underwent repeat CT 5-29 which was worrisome for anastomotic breakdown. Several abscesses were identified and she subsequently underwent percutaneous drain placement x4 in interventional radiology on 02/13/2012. By 02/19/2012 she developed bloody stools with clots, this was in association with the development of profound thrombocytopenia. Platelets were as low as less than 5. Hematology was consulted. Was felt that thrombocytopenia etiology was due to consumption but likely related to beta lactams. There was no evidence of TTP, hit or DIC. In addition to receiving platelets empiric treatment for ITP with steroids as well as IV Ig was given the patient did not have response. She also failed Amicar treatment. She has subsequently stabilized enough to be transferred out of the intensive care unit to the step down unit. Pulmonary critical care medicine had been consulted while the patient was in the intensive care unit and has transferred medicine consultation care over to triad hospitalists on 6/13 .  Subjective:  No new events Objective: Blood pressure 138/52, pulse 79, temperature 98.3 F (36.8 C), temperature source Oral, resp. rate 20, height 5\' 4"  (1.626 m), weight 68.176 kg  (150 lb 4.8 oz), SpO2 100.00%.  Intake/Output from previous day: 06/14 0701 - 06/15 0700 In: 630 [TPN:630] Out: 1247.5 [Drains:222.5; Stool:1025] Intake/Output this shift:   alert oriented times 3 Heart regular rate and rhythms without murmurs rubs or gallops Chest clear to auscultation     Lab Results:  Basename 03/07/12 0555 03/05/12 0500  WBC 9.4 11.0*  HGB 8.0* 8.0*  HCT 24.0* 24.2*  PLT 191 150   BMET  Basename 03/07/12 0555 03/06/12 1050  NA 133* 132*  K 3.2* 3.6  CL 99 99  CO2 27 25  GLUCOSE 119* 96  BUN 7 8  CREATININE 0.37* 0.36*  CALCIUM 10.0 10.0   Medications:     . alteplase  2 mg Intracatheter Once  . alteplase  2 mg Intracatheter Once  . antiseptic oral rinse  15 mL Mouth Rinse QID  . barrier cream  1 application Topical TID  . chlorhexidine  15 mL Mouth/Throat BID  . ciprofloxacin  400 mg Intravenous Q12H  . feeding supplement  1 Container Oral TID BM  . ferrous fumarate  1 tablet Oral Daily  . folic acid  1 mg Oral Daily  . gatorade (BH)  240 mL Oral TID  . lidocaine   Topical TID  . metoCLOPramide (REGLAN) injection  10 mg Intravenous Q8H  . metronidazole  500 mg Intravenous Q8H  . potassium chloride  10 mEq Intravenous Q1 Hr x 6  . prochlorperazine  5 mg Intravenous QID  . sodium chloride  10-40 mL Intracatheter Q12H  . DISCONTD: HYDROmorphone PCA 0.3 mg/mL   Intravenous Q4H  . DISCONTD: potassium chloride  40  mEq Oral Q4H    Assessment/Plan:  Sigmoid stricture/S/P sigmoid colectomy and ileostomy with anastomotic leak/IA abscesss *Per primary team - General Surgery  Thrombocytopenia due to medication *Platelets have now normalized *Primary etiology felt to be beta lactams which were discontinued *Also process related to consumption from acute septic illness and likely from gram-negative pathogens due diffuse intra-abdominal abscesses *No evidence of ITP, HIT or true DIC *Status post transfusion of 13 units pheresed platelets this  admission *Status post transfusion of 4 units FFP this admission  Ileus following gastrointestinal surgery/Nausea & vomiting/On total parenteral nutrition *Management per Gen Surgery  Post-radiation rectal bleeding due to colitis *Exacerbated by profound thrombocytopenia *Resolved  Acute posthemorrhagic anemia *Hemoglobin stable  *Nadir this admission was 5.6 *Status post transfusion 27 units of packed red blood cells his admit *Baseline hemaglobin prior to this admit between 9.5 and 10 dating back to June 2012  Cervical cancer S/p radiation    Hypercalcemia *Was noted at time of admission on 01/27/2012 and felt at that time to be related to hydrochlorothiazide + volume deplete *Calcium currently averaging between 9.5 and 10.2 *No apparent recurrence of malignancy has been identified with recent surgical procedures  Disposition *At discretion of primary surgical team  *[name] will continue to follow as a Medical Consult   LOS: 40 days   Vanessa [name]

## 2012-03-07 NOTE — Progress Notes (Addendum)
PARENTERAL NUTRITION CONSULT NOTE - FOLLOW UP  Pharmacy Consult for TPN Indication: intraabdominal abscess, ileus  Allergies  Allergen Reactions  . Labetalol Hcl Itching and Other (See Comments)    Bumps to bilateral arms.  . Morphine And Related Hives   Patient Measurements: Height: 5\' 4"  (162.6 cm) Weight: 150 lb 4.8 oz (68.176 kg) IBW/kg (Calculated) : 54.7   Vital Signs: Temp: 98.3 F (36.8 C) (06/15 0620) Temp src: Oral (06/15 0620) BP: 138/52 mmHg (06/15 0620) Pulse Rate: 79  (06/15 0620) Intake/Output from previous day: 06/14 0701 - 06/15 0700 In: 630 [TPN:630] Out: 1247.5 [Drains:222.5; Stool:1025] Intake/Output from this shift:  Labs:  Basename 03/07/12 0555 03/05/12 0500  WBC 9.4 11.0*  HGB 8.0* 8.0*  HCT 24.0* 24.2*  PLT 191 150  APTT -- --  INR -- --    Basename 03/07/12 0555 03/06/12 1050 03/06/12 0830 03/05/12 0500  NA 133* 132* 128* --  K 3.2* 3.6 4.5 --  CL 99 99 95* --  CO2 27 25 26  --  GLUCOSE 119* 96 708* --  BUN 7 8 7  --  CREATININE 0.37* 0.36* 0.40* --  LABCREA -- -- -- --  CREAT24HRUR -- -- -- --  CALCIUM 10.0 10.0 9.8 --  MG -- -- -- 1.8  PHOS -- -- -- 3.4  PROT -- -- -- 6.0  ALBUMIN -- -- -- 1.7*  AST -- -- -- 15  ALT -- -- -- 14  ALKPHOS -- -- -- 103  BILITOT -- -- -- 0.6  BILIDIR -- -- -- --  IBILI -- -- -- --  PREALBUMIN -- -- -- --  TRIG -- -- -- --  CHOLHDL -- -- -- --  CHOL -- -- -- --   Estimated Creatinine Clearance: 92.2 ml/min (by C-G formula based on Cr of 0.37).  No results found for this basename: GLUCAP:3 in the last 72 hours  Insulin Requirements in the past 24 hours:  SSI, CBG checks d/c'd d/t minimal SSI needs.  NO insulin in TNA    Current Nutrition:  - Clinimix E5/20 at 70 ml/hr (at goal) - Avoiding lipids due to possible contribution to thrombocytopenia.   - Full liquids  Nutritional Goals:  1800-2000 kCal, 80-95 grams of protein per day.    Clinimix E5/20 goal rate of 15ml/hr will provide an  average of 1478 kcal and 84g protein per day based on RD assessment (82% total kcal and 100% protein needs)  Assessment: 27 yof with a history of cervical cancer who is s/p sigmoid colectomy and diverting ileostomy on 5/14.  Developed multiple intra-abdominal abscesses d/t anastomotic leak and had 3 perc drains placed.  5/29 patient had multiple blood BM, hemoptysis, and abdominal pain. CT 5/30 showed additional abscesses and concern for fistula formation. CT 6/6: persistent abscesses. New periotoneal drain placed by IR 6/7. Plts dropped to <5 (240k on 5/25) but have now improved to 191  GI:  Advanced to full liquid diet with resource TID BM and gatorade TID.  OG tube removed 6/12 -continued on scheduled reglan/compazine. Off PPI and H2RA d/t thrombocytopenia risk.  Endo: No hx DM. CBG controlled. SSI, CBG checks d/c'd 6/10.   Lytes:  Na low - ?Fluid o/load. NS at [facility]. K low.  Corr Ca 12.04. Other lytes ok.  Acid-base status ok.  Renal: SCr good, UOP not quantified.    Pulm: RA  Cards:Hemodynamically stable  Hepatobil: Alk phos/LFTs/bili, triglycerides and cholesterol WNL. Prealbumin steadily increased, WNL on last check (21.5 on  6/9)  Neuro: pain controlled on dilaudid PCA, now transitioning to prn PO/IV dilaudid  Heme/Onc: Thrombocytopenia resolved with steroids, IVIG, N-Plate, tranexamic acid, amicar, and discontinuation of multiple offending meds. Noted several blood product transfusions to date.  Hgb low, but stable. Ferrous fumarate added.    ID:D#5 cipro/flagyl - restarted for peritoneal abscesses with polymicrobial bacteria from 6/7 cx and  leukocytosis; CCM stopped all abx 6/4 d/t potential for inducing thrombocytopenia;  WBC stable; afebrile.   ID continues to refrain from using beta-lactams d/t potential cause of thrombocytopenia.   Best Practices: SCDs.    Plan:  - Replete K with KCL liquid PO q 4 x 2 doses today - Decrease Clinimix E 5/20 to 75% goal at 50 ml/hr.  Noted MD plans to assess prealbumin before d/c TPN- will f/up prealbumin Monday - MVI and Trace elements MWF only d/t national shortage - Fat emulsion omitted d/t to chance of worsening thrombocytopenia. Risk of EFAD no longer significant concern now that pt on full liquid diet.   - f/u AM labs- BMET tomorrow, Prealbumin ordered for 6/16 so results will be available 6/17 - f/u d/c TPN when diet is adequate and prealbumin stable   Uyen Eichholz K. Allena Katz, PharmD, BCPS.  Clinical Pharmacist Pager [phone]. 03/07/2012 8:42 AM    Per RN request and patient comfort, will change KCL to IV Lakindra Wible K. Allena Katz, PharmD Pgr [phone] 03/07/2012 9:38 AM

## 2012-03-08 DIAGNOSIS — K651 Peritoneal abscess: Secondary | ICD-10-CM

## 2012-03-08 DIAGNOSIS — K631 Perforation of intestine (nontraumatic): Secondary | ICD-10-CM

## 2012-03-08 DIAGNOSIS — K625 Hemorrhage of anus and rectum: Secondary | ICD-10-CM

## 2012-03-08 DIAGNOSIS — M311 Thrombotic microangiopathy: Secondary | ICD-10-CM

## 2012-03-08 LAB — BASIC METABOLIC PANEL
BUN: 6 mg/dL (ref 6–23)
CO2: 24 mEq/L (ref 19–32)
Calcium: 10.4 mg/dL (ref 8.4–10.5)
Creatinine, Ser: 0.4 mg/dL — ABNORMAL LOW (ref 0.50–1.10)
Glucose, Bld: 102 mg/dL — ABNORMAL HIGH (ref 70–99)
Sodium: 131 mEq/L — ABNORMAL LOW (ref 135–145)

## 2012-03-08 MED ORDER — CLINIMIX E/DEXTROSE (5/20) 5 % IV SOLN
INTRAVENOUS | Status: AC
  Administered 2012-03-08: 18:00:00 via INTRAVENOUS
  Filled 2012-03-08: qty 2000

## 2012-03-08 MED ORDER — WHITE PETROLATUM GEL
Status: AC
  Filled 2012-03-08: qty 5

## 2012-03-08 MED ORDER — POTASSIUM CHLORIDE 10 MEQ/50ML IV SOLN
10.0000 meq | INTRAVENOUS | Status: AC
  Administered 2012-03-08 (×2): 10 meq via INTRAVENOUS
  Filled 2012-03-08 (×2): qty 50

## 2012-03-08 MED ORDER — LIDOCAINE 4 % EX CREA
TOPICAL_CREAM | Freq: Four times a day (QID) | CUTANEOUS | Status: DC
  Administered 2012-03-08 – 2012-03-09 (×5): 1 via TOPICAL
  Administered 2012-03-09 – 2012-03-11 (×6): via TOPICAL
  Filled 2012-03-08: qty 30
  Filled 2012-03-08: qty 5
  Filled 2012-03-08 (×5): qty 30
  Filled 2012-03-08 (×3): qty 5
  Filled 2012-03-08: qty 30
  Filled 2012-03-08: qty 5
  Filled 2012-03-08: qty 30
  Filled 2012-03-08: qty 5
  Filled 2012-03-08 (×2): qty 30
  Filled 2012-03-08 (×2): qty 5
  Filled 2012-03-08: qty 30
  Filled 2012-03-08 (×4): qty 5
  Filled 2012-03-08: qty 30
  Filled 2012-03-08 (×4): qty 5
  Filled 2012-03-08: qty 30
  Filled 2012-03-08 (×3): qty 5
  Filled 2012-03-08: qty 30
  Filled 2012-03-08: qty 5
  Filled 2012-03-08 (×2): qty 30
  Filled 2012-03-08: qty 5
  Filled 2012-03-08 (×2): qty 30
  Filled 2012-03-08 (×4): qty 5
  Filled 2012-03-08: qty 30
  Filled 2012-03-08 (×6): qty 5
  Filled 2012-03-08 (×2): qty 30
  Filled 2012-03-08 (×2): qty 5
  Filled 2012-03-08 (×2): qty 30
  Filled 2012-03-08: qty 5
  Filled 2012-03-08: qty 30

## 2012-03-08 MED ORDER — PANTOPRAZOLE SODIUM 40 MG IV SOLR
40.0000 mg | INTRAVENOUS | Status: DC
  Administered 2012-03-08 – 2012-03-11 (×3): 40 mg via INTRAVENOUS
  Filled 2012-03-08 (×4): qty 40

## 2012-03-08 MED ORDER — MAGNESIUM SULFATE 40 MG/ML IJ SOLN
2.0000 g | Freq: Once | INTRAMUSCULAR | Status: AC
  Administered 2012-03-08: 2 g via INTRAVENOUS
  Filled 2012-03-08: qty 50

## 2012-03-08 NOTE — Progress Notes (Signed)
MEDICATION RELATED CONSULT NOTE - FOLLOW UP   Pharmacy Consult for pain management Indication: Pain consult   Allergies  Allergen Reactions  . Labetalol Hcl Itching and Other (See Comments)    Bumps to bilateral arms.  . Morphine And Related Hives    Patient Measurements: Height: 5\' 4"  (162.6 cm) Weight: 150 lb 4.8 oz (68.176 kg) IBW/kg (Calculated) : 54.7   Vital Signs: Temp: 98.8 F (37.1 C) (06/16 0636) Temp src: Oral (06/16 0636) BP: 120/64 mmHg (06/16 0636) Pulse Rate: 115  (06/16 0636)  Assessment: 36yo female with h/o cervical cancer & radiation, s/p sigmoid colectomy & diverting ileostomy with multiple intra-abdominal abscesses.    Rn called 6/15 PM stating that the pt is complaining of 9/10 pain. She is off PCA and PO/IV prn dilaudid didn't seem to help much. She was started on scheduled Oxycontin 20mg  q 12h and and prn dilaudid was increased to 1mg  IV q2h. Noted pain scores and down significantly following this change (now in 2-3s). PRN dilaudid use: 4.5mg  in last 24h, and Oxycontin given last PM ~ 2100.  Goal of Therapy:  Adequate pain relief  Plan:  - Will f/up pain scores daily  Callum Wolf K. Allena Katz, PharmD, BCPS.  Clinical Pharmacist Pager [phone]. 03/08/2012 7:45 AM

## 2012-03-08 NOTE — Progress Notes (Signed)
PARENTERAL NUTRITION CONSULT NOTE - FOLLOW UP  Pharmacy Consult for TPN Indication: intraabdominal abscess, ileus  Allergies  Allergen Reactions  . Labetalol Hcl Itching and Other (See Comments)    Bumps to bilateral arms.  . Morphine And Related Hives   Patient Measurements: Height: 5\' 4"  (162.6 cm) Weight: 150 lb 4.8 oz (68.176 kg) IBW/kg (Calculated) : 54.7   Vital Signs: Temp: 98.8 F (37.1 C) (06/16 0636) Temp src: Oral (06/16 0636) BP: 120/64 mmHg (06/16 0636) Pulse Rate: 115  (06/16 0636) Intake/Output from previous day: 06/15 0701 - 06/16 0700 In: 2604 [P.O.:203; I.V.:1041; TPN:1360] Out: 2554 [Urine:400; Emesis/NG output:1200; Drains:154; Stool:800] Intake/Output from this shift:  Labs:  [facility] 03/07/12 0555  WBC 9.4  HGB 8.0*  HCT 24.0*  PLT 191  APTT --  INR --    Basename 03/08/12 0500 03/07/12 0555 03/06/12 1050  NA 131* 133* 132*  K 3.7 3.2* 3.6  CL 99 99 99  CO2 24 27 25   GLUCOSE 102* 119* 96  BUN 6 7 8   CREATININE 0.40* 0.37* 0.36*  LABCREA -- -- --  CREAT24HRUR -- -- --  CALCIUM 10.4 10.0 10.0  MG 1.6 -- --  PHOS -- -- --  PROT -- -- --  ALBUMIN -- -- --  AST -- -- --  ALT -- -- --  ALKPHOS -- -- --  BILITOT -- -- --  BILIDIR -- -- --  IBILI -- -- --  PREALBUMIN -- -- --  TRIG -- -- --  CHOLHDL -- -- --  CHOL -- -- --   Estimated Creatinine Clearance: 92.2 ml/min (by C-G formula based on Cr of 0.4).  No results found for this basename: GLUCAP:3 in the last 72 hours  Insulin Requirements in the past 24 hours:  SSI, CBG checks d/c'd d/t minimal SSI needs.  NO insulin in TNA    Current Nutrition:  - Clinimix E5/20 at 70 ml/hr (at goal) - Avoiding lipids due to possible contribution to thrombocytopenia.   - Full liquids  Nutritional Goals:  1800-2000 kCal, 80-95 grams of protein per day.    Clinimix E5/20 goal rate of 69ml/hr will provide an average of 1478 kcal and 84g protein per day based on RD assessment (82% total kcal  and 100% protein needs)  Assessment: 36 yof with a history of cervical cancer who is s/p sigmoid colectomy and diverting ileostomy on 5/14.  Developed multiple intra-abdominal abscesses d/t anastomotic leak and had 3 perc drains placed.  5/29 patient had multiple blood BM, hemoptysis, and abdominal pain. CT 5/30 showed additional abscesses and concern for fistula formation. CT 6/6: persistent abscesses. New periotoneal drain placed by IR 6/7. Plts dropped to <5 (240k on 5/25) but have now improved to 191  GI:  Advanced to full liquid diet with resource TID BM and gatorade TID.  OG tube removed 6/12 -continued on scheduled reglan/compazine. Off PPI and H2RA d/t thrombocytopenia risk. Noted diet not advanced further d/t abd distention 6/15. Pt continues to have BM. Noted emesis last PM.  Endo: No hx DM. CBG controlled. SSI, CBG checks d/c'd 6/10.   Lytes:  Na low - ?Fluid o/load. NS at [facility]. Corr Ca 12.04. Other lytes ok, would like to target K ~4 and Mg ~2.  Acid-base status ok.  Renal: SCr good, UOP seems low, suspect inaccurate.    Pulm: RA  Cards:Hemodynamically stable  Hepatobil: Alk phos/LFTs/bili, triglycerides and cholesterol WNL. Prealbumin steadily increased, WNL on last check (21.5 on 6/9)  Neuro:  pain control issues continue, PCA off, pt now on prn PO/IV dilaudid and oxycontin.  Heme/Onc: Thrombocytopenia resolved with steroids, IVIG, N-Plate, tranexamic acid, amicar, and discontinuation of multiple offending meds. Noted several blood product transfusions to date.  Hgb low, but stable. Ferrous fumarate added.    ID:D#6 cipro/flagyl - restarted for peritoneal abscesses with polymicrobial bacteria from 6/7 cx and  leukocytosis; CCM stopped all abx 6/4 d/t potential for inducing thrombocytopenia;  WBC stable; afebrile.   ID continues to refrain from using beta-lactams d/t potential cause of thrombocytopenia.   Best Practices: SCDs.    Plan:  - Replete K with KCL runs x 2  doses. - Magnesium 2gm IV bolus today - Continue Clinimix E 5/20 at 75% goal (50 ml/hr). Noted MD plans to assess prealbumin before d/c TPN- will f/up prealbumin Monday, also would like to see significant PO intake (>60% goal) before d/c TPN - MVI and Trace elements MWF only d/t national shortage - Fat emulsion omitted d/t to chance of worsening thrombocytopenia. Risk of EFAD no longer significant concern now that pt on full liquid diet.   - f/u AM labs, Prealbumin ordered for 6/16 so results will be available 6/17  Shizue Kaseman K. Allena Katz, PharmD, BCPS.  Clinical Pharmacist Pager [phone]. 03/08/2012 7:30 AM

## 2012-03-08 NOTE — Progress Notes (Signed)
[name] f/u internal medicine consult   Interval History: 36 y/o F with Hx of cervical cancer s/p chemo / radiation (2011). Patient was seen in the emergency room on May 5 with abdominal pain nausea vomiting. She was diagnosed as UTI and sent home on medication. She subsequently was admitted 5/6 with abd pain and n/v. After admission was found to have a sigmoid colon stricture found on colonoscopy, CT and BE at 20 cm. Pt has a history of cervical cancer and radiation in 2011. Many week history of nausea and vomiting.  High grade stricture noted. .Colonoscopy also showed mild proctitis. BE shows 95 % narrowing at 20 cm from anal verge. She underwent colectomy and diverting ileostomy (02/04/12) and her path showed radiation changes. She underwent repeat CT 5-29 which was worrisome for anastomotic breakdown. Several abscesses were identified and she subsequently underwent percutaneous drain placement x4 in interventional radiology on 02/13/2012. By 02/19/2012 she developed bloody stools with clots, this was in association with the development of profound thrombocytopenia. Platelets were as low as less than 5. Hematology was consulted. Was felt that thrombocytopenia etiology was due to consumption but likely related to beta lactams. There was no evidence of TTP, hit or DIC. In addition to receiving platelets empiric treatment for ITP with steroids as well as IV Ig was given the patient did not have response. She also failed Amicar treatment. She has subsequently stabilized enough to be transferred out of the intensive care unit to the step down unit. Pulmonary critical care medicine had been consulted while the patient was in the intensive care unit and has transferred medicine consultation care over to triad hospitalists on 6/13 .  Subjective: Complaining of bilateral buttock pain  Objective: Blood pressure 120/64, pulse 115, temperature 98.8 F (37.1 C), temperature source Oral, resp. rate 20, height 5\' 4"  (1.626  m), weight 68.176 kg (150 lb 4.8 oz), SpO2 97.00%. Patient Vitals for the past 24 hrs:  BP Temp Temp src Pulse Resp SpO2  03/08/12 0636 120/64 mmHg 98.8 F (37.1 C) Oral 115  20  97 %  03/07/12 2216 129/95 mmHg 98.2 F (36.8 C) Oral 83  19  100 %  03/07/12 1900 139/99 mmHg 98.1 F (36.7 C) Oral 84  18  100 %   alert and oriented x3 Heart regular rate and rhythm without murmurs rubs gallops Examined the patient's sacral area she does have bilateral skin thickening and color change suggestive of stage I decubitus  Intake/Output from previous day: 06/15 0701 - 06/16 0700 In: 2604 [P.O.:203; I.V.:1041; TPN:1360] Out: 2554 [Urine:400; Emesis/NG output:1200; Drains:154; Stool:800] Intake/Output this shift:    Lab Results:  Basename 03/07/12 0555  WBC 9.4  HGB 8.0*  HCT 24.0*  PLT 191   BMET  Basename 03/08/12 0500 03/07/12 0555  NA 131* 133*  K 3.7 3.2*  CL 99 99  CO2 24 27  GLUCOSE 102* 119*  BUN 6 7  CREATININE 0.40* 0.37*  CALCIUM 10.4 10.0   Medications:     . alteplase  2 mg Intracatheter Once  . antiseptic oral rinse  15 mL Mouth Rinse QID  . barrier cream  1 application Topical TID  . chlorhexidine  15 mL Mouth/Throat BID  . ciprofloxacin  400 mg Intravenous Q12H  . feeding supplement  1 Container Oral TID BM  . ferrous fumarate  1 tablet Oral Daily  . folic acid  1 mg Oral Daily  . gatorade (BH)  240 mL Oral TID  .  HYDROmorphone (DILAUDID) injection  1.5 mg Intravenous NOW  . lidocaine   Topical TID  . magnesium sulfate 1 - 4 g bolus IVPB  2 g Intravenous Once  . metoCLOPramide (REGLAN) injection  10 mg Intravenous Q8H  . metronidazole  500 mg Intravenous Q8H  . oxyCODONE  20 mg Oral BID  . potassium chloride  10 mEq Intravenous Q1 Hr x 6  . potassium chloride  10 mEq Intravenous Q1 Hr x 2  . prochlorperazine  5 mg Intravenous QID  . sodium chloride  10-40 mL Intracatheter Q12H  . DISCONTD: potassium chloride  40 mEq Oral Q4H     Assessment/Plan:  Sigmoid stricture/S/P sigmoid colectomy and ileostomy with anastomotic leak/IA abscesss *Per primary team - General Surgery  Thrombocytopenia due to medication *Platelets have now normalized *Primary etiology felt to be beta lactams which were discontinued *Also process related to consumption from acute septic illness and likely from gram-negative pathogens due diffuse intra-abdominal abscesses *No evidence of ITP, HIT or true DIC *Status post transfusion of 13 units pheresed platelets this admission *Status post transfusion of 4 units FFP this admission  Ileus following gastrointestinal surgery/Nausea & vomiting/On total parenteral nutrition *Management per Gen Surgery  Post-radiation rectal bleeding due to colitis *Exacerbated by profound thrombocytopenia *Resolved  Acute posthemorrhagic anemia *Hemoglobin stable  *Nadir this admission was 5.6 *Status post transfusion 27 units of packed red blood cells his admit *Baseline hemaglobin prior to this admit between 9.5 and 10 dating back to June 2012  Cervical cancer S/p radiation    Hypercalcemia *Was noted at time of admission on 01/27/2012 and felt at that time to be related to hydrochlorothiazide + volume deplete *Calcium on 03/08/12 is 10.4 *No apparent recurrence of malignancy has been identified with recent surgical procedures  Disposition *At discretion of primary surgical team  *[name] will continue to follow as a Medical Consult   LOS: 41 days   Peder Allums

## 2012-03-08 NOTE — Progress Notes (Signed)
33 Days Post-Op  Subjective: Vomited yesterday but nausea now improved. No more BM  Objective: Vital signs in last 24 hours: Temp:  [98.1 F (36.7 C)-98.8 F (37.1 C)] 98.8 F (37.1 C) (06/16 0636) Pulse Rate:  [82-115] 115  (06/16 0636) Resp:  [18-20] 20  (06/16 0636) BP: (119-159)/(64-99) 120/64 mmHg (06/16 0636) SpO2:  [97 %-100 %] 97 % (06/16 0636) Last BM Date: 03/08/12  Intake/Output from previous day: 06/15 0701 - 06/16 0700 In: 2604 [P.O.:203; I.V.:1041; TPN:1360] Out: 2554 [Urine:400; Emesis/NG output:1200; Drains:154; Stool:800] Intake/Output this shift:    General appearance: alert, cooperative and no distress GI: soft, NT, ND, wound granulating well, drains unchanged  Lab Results:   [facility] 03/07/12 0555  WBC 9.4  HGB 8.0*  HCT 24.0*  PLT 191   BMET  Basename 03/08/12 0500 03/07/12 0555  NA 131* 133*  K 3.7 3.2*  CL 99 99  CO2 24 27  GLUCOSE 102* 119*  BUN 6 7  CREATININE 0.40* 0.37*  CALCIUM 10.4 10.0   PT/INR No results found for this basename: LABPROT:2,INR:2 in the last 72 hours ABG No results found for this basename: PHART:2,PCO2:2,PO2:2,HCO3:2 in the last 72 hours  Studies/Results: No results found.  Anti-infectives: Anti-infectives     Start     Dose/Rate Route Frequency Ordered Stop   03/03/12 1200   metroNIDAZOLE (FLAGYL) IVPB 500 mg        500 mg 100 mL/hr over 60 Minutes Intravenous Every 8 hours 03/03/12 0957     03/03/12 1100   ciprofloxacin (CIPRO) IVPB 400 mg        400 mg 200 mL/hr over 60 Minutes Intravenous Every 12 hours 03/03/12 0957     02/24/12 1600   levofloxacin (LEVAQUIN) IVPB 750 mg  Status:  Discontinued        750 mg 100 mL/hr over 90 Minutes Intravenous Every 24 hours 02/24/12 1526 02/26/12 1012   02/20/12 1415   cefTRIAXone (ROCEPHIN) 1 g in dextrose 5 % 50 mL IVPB  Status:  Discontinued        1 g 100 mL/hr over 30 Minutes Intravenous Every 24 hours 02/20/12 1404 02/24/12 1526   02/19/12 1800    ciprofloxacin (CIPRO) IVPB 400 mg  Status:  Discontinued        400 mg 200 mL/hr over 60 Minutes Intravenous Every 12 hours 02/19/12 1612 02/20/12 1404   02/19/12 1700   metroNIDAZOLE (FLAGYL) IVPB 500 mg  Status:  Discontinued        500 mg 100 mL/hr over 60 Minutes Intravenous 3 times per day 02/19/12 1612 02/26/12 1012   02/12/12 0800   piperacillin-tazobactam (ZOSYN) IVPB 3.375 g  Status:  Discontinued        3.375 g 12.5 mL/hr over 240 Minutes Intravenous 3 times per day 02/12/12 0800 02/19/12 1612   02/10/12 1000   amoxicillin-clavulanate (AUGMENTIN) 875-125 MG per tablet 1 tablet  Status:  Discontinued        1 tablet Oral Every 12 hours 02/10/12 0823 02/12/12 0800   02/04/12 1800   cefOXitin (MEFOXIN) 1 g in dextrose 5 % 50 mL IVPB        1 g 100 mL/hr over 30 Minutes Intravenous Every 6 hours 02/04/12 1621 02/05/12 0617   02/04/12 0000   cefOXitin (MEFOXIN) 2 g in dextrose 5 % 50 mL IVPB        2 g 100 mL/hr over 30 Minutes Intravenous 60 min pre-op 02/03/12 0333 02/04/12 1145  02/03/12 0500   cefOXitin (MEFOXIN) 2 g in dextrose 5 % 50 mL IVPB  Status:  Discontinued        2 g 100 mL/hr over 30 Minutes Intravenous 60 min pre-op 02/02/12 1156 02/03/12 0333   02/03/12 0000   cefOXitin (MEFOXIN) 2 g in dextrose 5 % 50 mL IVPB  Status:  Discontinued        2 g 100 mL/hr over 30 Minutes Intravenous 60 min pre-op 02/02/12 1004 02/02/12 1156   01/27/12 2200   cefTRIAXone (ROCEPHIN) 1 g in dextrose 5 % 50 mL IVPB        1 g 100 mL/hr over 30 Minutes Intravenous Every 24 hours 01/27/12 2152 01/29/12 2359   01/27/12 1915   cefTRIAXone (ROCEPHIN) 1 g in dextrose 5 % 50 mL IVPB        1 g 100 mL/hr over 30 Minutes Intravenous  Once 01/27/12 1914 01/27/12 1957          Assessment/Plan: s/p Procedure(s) (LRB): COLON RESECTION SIGMOID (N/A)  nausea and distension improved now, diet as tolerated.  Replete fluids and electrolytes and losses due to ileostomy.  wound care.     LOS: 41 days    Vanessa [name] 03/08/2012

## 2012-03-09 DIAGNOSIS — K631 Perforation of intestine (nontraumatic): Secondary | ICD-10-CM

## 2012-03-09 DIAGNOSIS — K651 Peritoneal abscess: Secondary | ICD-10-CM

## 2012-03-09 DIAGNOSIS — K625 Hemorrhage of anus and rectum: Secondary | ICD-10-CM

## 2012-03-09 DIAGNOSIS — M311 Thrombotic microangiopathy: Secondary | ICD-10-CM

## 2012-03-09 LAB — COMPREHENSIVE METABOLIC PANEL
AST: 11 U/L (ref 0–37)
Albumin: 2 g/dL — ABNORMAL LOW (ref 3.5–5.2)
Alkaline Phosphatase: 85 U/L (ref 39–117)
BUN: 6 mg/dL (ref 6–23)
CO2: 25 mEq/L (ref 19–32)
Chloride: 100 mEq/L (ref 96–112)
GFR calc non Af Amer: >90 mL/min (ref >90)
Potassium: 4 mEq/L (ref 3.5–5.1)
Total Bilirubin: 0.3 mg/dL (ref 0.3–1.2)

## 2012-03-09 LAB — CBC
HCT: 25 % — ABNORMAL LOW (ref 36.0–46.0)
Hemoglobin: 8.1 g/dL — ABNORMAL LOW (ref 12.0–15.0)
MCH: 31.2 pg (ref 26.0–34.0)
MCHC: 32.4 g/dL (ref 30.0–36.0)

## 2012-03-09 LAB — DIFFERENTIAL
Basophils Relative: 1 % (ref 0–1)
Eosinophils Absolute: 0.2 10*3/uL (ref 0.0–0.7)
Monocytes Absolute: 0.7 10*3/uL (ref 0.1–1.0)
Neutro Abs: 5.9 10*3/uL (ref 1.7–7.7)

## 2012-03-09 LAB — MAGNESIUM: Magnesium: 1.7 mg/dL (ref 1.5–2.5)

## 2012-03-09 MED ORDER — MAGNESIUM SULFATE 40 MG/ML IJ SOLN
4.0000 g | Freq: Once | INTRAMUSCULAR | Status: AC
  Administered 2012-03-09: 4 g via INTRAVENOUS
  Filled 2012-03-09: qty 100

## 2012-03-09 MED ORDER — ZINC TRACE METAL 1 MG/ML IV SOLN
INTRAVENOUS | Status: AC
  Administered 2012-03-09: 18:00:00 via INTRAVENOUS
  Filled 2012-03-09: qty 2000

## 2012-03-09 NOTE — Progress Notes (Signed)
[name] f/u internal medicine consult   Interval History: 36 y/o F with Hx of cervical cancer s/p chemo / radiation (2011). Patient was seen in the emergency room on May 5 with abdominal pain nausea vomiting. She was diagnosed as UTI and sent home on medication. She subsequently was admitted 5/6 with abd pain and n/v. After admission was found to have a sigmoid colon stricture found on colonoscopy, CT and BE at 20 cm. Pt has a history of cervical cancer and radiation in 2011. Many week history of nausea and vomiting.  High grade stricture noted. .Colonoscopy also showed mild proctitis. BE shows 95 % narrowing at 20 cm from anal verge. She underwent colectomy and diverting ileostomy (02/04/12) and her path showed radiation changes. She underwent repeat CT 5-29 which was worrisome for anastomotic breakdown. Several abscesses were identified and she subsequently underwent percutaneous drain placement x4 in interventional radiology on 02/13/2012. By 02/19/2012 she developed bloody stools with clots, this was in association with the development of profound thrombocytopenia. Platelets were as low as less than 5. Hematology was consulted. Was felt that thrombocytopenia etiology was due to consumption but likely related to beta lactams. There was no evidence of TTP, hit or DIC. In addition to receiving platelets empiric treatment for ITP with steroids as well as IV Ig was given the patient did not have response. She also failed Amicar treatment. She has subsequently stabilized enough to be transferred out of the intensive care unit to the step down unit. Pulmonary critical care medicine had been consulted while the patient was in the intensive care unit and has transferred medicine consultation care over to triad hospitalists on 6/13 .  Subjective: Wants food  Objective: Blood pressure 121/77, pulse 93, temperature 98.7 F (37.1 C), temperature source Oral, resp. rate 19, height 5\' 4"  (1.626 m), weight 68.176 kg (150  lb 4.8 oz), SpO2 96.00%. Patient Vitals for the past 24 hrs:  BP Temp Temp src Pulse Resp SpO2  03/09/12 1008 121/77 mmHg 98.7 F (37.1 C) Oral 93  19  96 %  03/09/12 0542 113/66 mmHg 98.7 F (37.1 C) Oral 105  18  98 %  03/08/12 2215 119/76 mmHg 98.4 F (36.9 C) Oral 89  20  100 %  03/08/12 1848 131/84 mmHg 98.1 F (36.7 C) Oral 91  20  100 %  03/08/12 1430 108/72 mmHg 98.2 F (36.8 C) Oral 107  20  100 %   alert and oriented x3 Heart regular rate and rhythm without murmurs rubs gallops   Intake/Output from previous day: 06/16 0701 - 06/17 0700 In: 2506 [P.O.:480; I.V.:739; TPN:1174] Out: 440 [Drains:190; Stool:250] Intake/Output this shift:    Lab Results:  Basename 03/09/12 0508 03/07/12 0555  WBC 8.8 9.4  HGB 8.1* 8.0*  HCT 25.0* 24.0*  PLT 200 191   BMET  Basename 03/09/12 0508 03/08/12 0500  NA 131* 131*  K 4.0 3.7  CL 100 99  CO2 25 24  GLUCOSE 101* 102*  BUN 6 6  CREATININE 0.45* 0.40*  CALCIUM 10.4 10.4   Medications:     . antiseptic oral rinse  15 mL Mouth Rinse QID  . barrier cream  1 application Topical TID  . chlorhexidine  15 mL Mouth/Throat BID  . ciprofloxacin  400 mg Intravenous Q12H  . feeding supplement  1 Container Oral TID BM  . ferrous fumarate  1 tablet Oral Daily  . folic acid  1 mg Oral Daily  . gatorade [facility])  240  mL Oral TID  . lidocaine   Topical QID  . magnesium sulfate 1 - 4 g bolus IVPB  4 g Intravenous Once  . metoCLOPramide (REGLAN) injection  10 mg Intravenous Q8H  . metronidazole  500 mg Intravenous Q8H  . oxyCODONE  20 mg Oral BID  . pantoprazole (PROTONIX) IV  40 mg Intravenous Q24H  . prochlorperazine  5 mg Intravenous QID  . sodium chloride  10-40 mL Intracatheter Q12H  . white petrolatum      . DISCONTD: alteplase  2 mg Intracatheter Once  . DISCONTD: lidocaine   Topical TID    Assessment/Plan:  Sigmoid stricture/S/P sigmoid colectomy and ileostomy with anastomotic leak/IA abscesss *Per primary team -  General Surgery  Thrombocytopenia due to medication *Platelets have now normalized *Primary etiology felt to be beta lactams which were discontinued *Also process related to consumption from acute septic illness and likely from gram-negative pathogens due diffuse intra-abdominal abscesses *No evidence of ITP, HIT or true DIC *Status post transfusion of 13 units pheresed platelets this admission *Status post transfusion of 4 units FFP this admission  Ileus following gastrointestinal surgery/Nausea & vomiting/On total parenteral nutrition *Management per Gen Surgery  Post-radiation rectal bleeding due to colitis *Exacerbated by profound thrombocytopenia *Resolved  Acute posthemorrhagic anemia *Hemoglobin stable  *Nadir this admission was 5.6 *Status post transfusion 27 units of packed red blood cells his admit *Baseline hemaglobin prior to this admit between 9.5 and 10 dating back to June 2012  Cervical cancer S/p radiation    Hypercalcemia *Was noted at time of admission on 01/27/2012 and felt at that time to be related to hydrochlorothiazide + volume deplete *Calcium on 03/08/12 is 10.4 *No apparent recurrence of malignancy has been identified with recent surgical procedures  Disposition *At discretion of primary surgical team  *[name] will continue to follow as a Medical Consult   LOS: 42 days   Domingos Riggi

## 2012-03-09 NOTE — Progress Notes (Signed)
Agree with A&P per JD,PA. Still feculent drainage from one drain, others are minimal Will pull one today

## 2012-03-09 NOTE — Progress Notes (Addendum)
PARENTERAL NUTRITION CONSULT NOTE - FOLLOW UP  Pharmacy Consult for TPN Indication: intraabdominal abscess, ileus  Allergies  Allergen Reactions  . Labetalol Hcl Itching and Other (See Comments)    Bumps to bilateral arms.  . Morphine And Related Hives   Patient Measurements: Height: 5\' 4"  (162.6 cm) Weight: 150 lb 4.8 oz (68.176 kg) IBW/kg (Calculated) : 54.7   Vital Signs: Temp: 98.7 F (37.1 C) (06/17 0542) Temp src: Oral (06/17 0542) BP: 113/66 mmHg (06/17 0542) Pulse Rate: 105  (06/17 0542) Intake/Output from previous day: 06/16 0701 - 06/17 0700 In: 2506 [P.O.:480; I.V.:739; TPN:1174] Out: 440 [Drains:190; Stool:250] Intake/Output from this shift:  Labs:  [facility] 03/09/12 0508 03/07/12 0555  WBC 8.8 9.4  HGB 8.1* 8.0*  HCT 25.0* 24.0*  PLT 200 191  APTT -- --  INR -- --    Basename 03/09/12 0508 03/08/12 0500 03/07/12 0555  NA 131* 131* 133*  K 4.0 3.7 3.2*  CL 100 99 99  CO2 25 24 27   GLUCOSE 101* 102* 119*  BUN 6 6 7   CREATININE 0.45* 0.40* 0.37*  LABCREA -- -- --  CREAT24HRUR -- -- --  CALCIUM 10.4 10.4 10.0  MG 1.7 1.6 --  PHOS 3.2 -- --  PROT 6.4 -- --  ALBUMIN 2.0* -- --  AST 11 -- --  ALT 9 -- --  ALKPHOS 85 -- --  BILITOT 0.3 -- --  BILIDIR -- -- --  IBILI -- -- --  PREALBUMIN -- 18.4 --  TRIG 70 -- --  CHOLHDL -- -- --  CHOL 61 -- --   Estimated Creatinine Clearance: 92.2 ml/min (by C-G formula based on Cr of 0.45).  No results found for this basename: GLUCAP:3 in the last 72 hours  Insulin Requirements in the past 24 hours:  SSI, CBG checks d/c'd d/t minimal SSI needs.  NO insulin in TNA    Current Nutrition:  - Clinimix E5/20 at 70 ml/hr (at goal) - Avoiding lipids due to possible contribution to thrombocytopenia.   - Full liquids  Nutritional Goals:  1800-2000 kCal, 80-95 grams of protein per day.    Clinimix E5/20 goal rate of 49ml/hr will provide an average of 1478 kcal and 84g protein per day based on RD assessment  (82% total kcal and 100% protein needs). Current rate 50 ml/hr = 056 kcal and 60g protein  Assessment: 26 yof with a history of cervical cancer who is s/p sigmoid colectomy and diverting ileostomy on 5/14. Developed multiple intra-abdominal abscesses d/t anastomotic leak and had 3 perc drains placed.   5/29: patient had multiple bloody BM, hemoptysis, and abdominal pain.  5/30: CT showed additional abscesses and concern for fistula formation.  6/6: CT: persistent abscesses.  6/7: New peritoneal drain placed by IR. Plts dropped to <5 (240k on 5/25) but have now improved to 191  GI:  Advanced to full liquid diet with resource TID BM and gatorade TID.  OG tube removed 6/12.Noted diet not advanced further d/t abd distention 6/15. No emesis noted 6/16. 480cc po intaked noted. Prealbumin dropped slightly to 18.4 Meds: Reglan 10 IV/8h, Compazine 5mg  IV/QID, IV PPI  Endo: No hx DM. CBG controlled. SSI, CBG checks d/c'd 6/10.   Lytes:  Na low - ?Fluid o/load. NS at [facility]. Corr Ca 12.04. Target K ~4 and Mg ~2.   Renal: SCr good, UOP not recorded 6/16.  Pulm: RA  Cards:Hemodynamically stable  Hepatobil: Alk phos/LFTs/bili, triglycerides 70 and cholesterol WNL. Prealbumin WNL  (21.5  on 6/9 down to 18.4 6/16)  Neuro: pain control issues continue, PCA off, pt now on prn IV dilaudid and oxycontin. Pain score 3 this am. Will follow  Heme/Onc: Thrombocytopenia resolved with steroids, IVIG, N-Plate, tranexamic acid, amicar, and discontinuation of multiple offending meds (beta-lactams?). Noted several blood product transfusions to date (13 units platelets, 4 units FFP, 27 units PRBC).  Hgb low, but stable. Ferrous fumarate added.    ID:D#7 cipro/flagyl - restarted for peritoneal abscesses with polymicrobial bacteria from 6/7 cx and  leukocytosis; WBC stable; afebrile.    Best Practices: SCDs.    Plan:  - Give Magnesium 4g IV bolus today over 4 hrs. - Continue Clinimix E 5/20 at 75% goal (50 ml/hr).  Noted MD plans to assess prealbumin before d/c TPN- will f/up prealbumin Monday, also would like to see significant PO intake (>60% goal) before d/c TPN - MVI and Trace elements MWF only d/t national shortage - Fat emulsion omitted d/t to chance of worsening thrombocytopenia. Risk of EFAD no longer significant concern now that pt on full liquid diet.      Kiran Carline S. Merilynn Finland, PharmD, [facility] Clinical Staff Pharmacist Pager [phone]  03/09/2012 8:06 AM

## 2012-03-09 NOTE — Progress Notes (Addendum)
34 Days Post-Op  Subjective: Doing well, would like "some real food!" no c/o of pain or nausea.  Objective: Vital signs in last 24 hours: Temp:  [98.1 F (36.7 C)-98.7 F (37.1 C)] 98.7 F (37.1 C) (06/17 0542) Pulse Rate:  [89-107] 105  (06/17 0542) Resp:  [18-20] 18  (06/17 0542) BP: (108-131)/(66-88) 113/66 mmHg (06/17 0542) SpO2:  [98 %-100 %] 98 % (06/17 0542) Last BM Date: 03/09/12  Intake/Output from previous day: 06/16 0701 - 06/17 0700 In: 2506 [P.O.:480; I.V.:739; TPN:1174] Out: 440 [Drains:190; Stool:250] Intake/Output this shift: Total I/O In: -  Out: 50 [Drains:50]  General appearance: alert, cooperative, appears older than stated age and no distress Abdomen is soft,non-tender,positive bowel sounds, illeostomy is drng dark fluid, JP's are currently empty, last abdominal drain is drng purulent tan fluid. WBC is slowly improving, HGB 8.1, PLts also slowly climbing.  Lab Results:   [facility] 03/09/12 0508 03/07/12 0555  WBC 8.8 9.4  HGB 8.1* 8.0*  HCT 25.0* 24.0*  PLT 200 191   BMET  Basename 03/09/12 0508 03/08/12 0500  NA 131* 131*  K 4.0 3.7  CL 100 99  CO2 25 24  GLUCOSE 101* 102*  BUN 6 6  CREATININE 0.45* 0.40*  CALCIUM 10.4 10.4   PT/INR No results found for this basename: LABPROT:2,INR:2 in the last 72 hours ABG No results found for this basename: PHART:2,PCO2:2,PO2:2,HCO3:2 in the last 72 hours  Studies/Results: No results found.  Anti-infectives: Anti-infectives     Start     Dose/Rate Route Frequency Ordered Stop   03/03/12 1200   metroNIDAZOLE (FLAGYL) IVPB 500 mg        500 mg 100 mL/hr over 60 Minutes Intravenous Every 8 hours 03/03/12 0957     03/03/12 1100   ciprofloxacin (CIPRO) IVPB 400 mg        400 mg 200 mL/hr over 60 Minutes Intravenous Every 12 hours 03/03/12 0957     02/24/12 1600   levofloxacin (LEVAQUIN) IVPB 750 mg  Status:  Discontinued        750 mg 100 mL/hr over 90 Minutes Intravenous Every 24 hours  02/24/12 1526 02/26/12 1012   02/20/12 1415   cefTRIAXone (ROCEPHIN) 1 g in dextrose 5 % 50 mL IVPB  Status:  Discontinued        1 g 100 mL/hr over 30 Minutes Intravenous Every 24 hours 02/20/12 1404 02/24/12 1526   02/19/12 1800   ciprofloxacin (CIPRO) IVPB 400 mg  Status:  Discontinued        400 mg 200 mL/hr over 60 Minutes Intravenous Every 12 hours 02/19/12 1612 02/20/12 1404   02/19/12 1700   metroNIDAZOLE (FLAGYL) IVPB 500 mg  Status:  Discontinued        500 mg 100 mL/hr over 60 Minutes Intravenous 3 times per day 02/19/12 1612 02/26/12 1012   02/12/12 0800   piperacillin-tazobactam (ZOSYN) IVPB 3.375 g  Status:  Discontinued        3.375 g 12.5 mL/hr over 240 Minutes Intravenous 3 times per day 02/12/12 0800 02/19/12 1612   02/10/12 1000   amoxicillin-clavulanate (AUGMENTIN) 875-125 MG per tablet 1 tablet  Status:  Discontinued        1 tablet Oral Every 12 hours 02/10/12 0823 02/12/12 0800   02/04/12 1800   cefOXitin (MEFOXIN) 1 g in dextrose 5 % 50 mL IVPB        1 g 100 mL/hr over 30 Minutes Intravenous Every 6 hours 02/04/12 1621 02/05/12  5621   02/04/12 0000   cefOXitin (MEFOXIN) 2 g in dextrose 5 % 50 mL IVPB        2 g 100 mL/hr over 30 Minutes Intravenous 60 min pre-op 02/03/12 0333 02/04/12 1145   02/03/12 0500   cefOXitin (MEFOXIN) 2 g in dextrose 5 % 50 mL IVPB  Status:  Discontinued        2 g 100 mL/hr over 30 Minutes Intravenous 60 min pre-op 02/02/12 1156 02/03/12 0333   02/03/12 0000   cefOXitin (MEFOXIN) 2 g in dextrose 5 % 50 mL IVPB  Status:  Discontinued        2 g 100 mL/hr over 30 Minutes Intravenous 60 min pre-op 02/02/12 1004 02/02/12 1156   01/27/12 2200   cefTRIAXone (ROCEPHIN) 1 g in dextrose 5 % 50 mL IVPB        1 g 100 mL/hr over 30 Minutes Intravenous Every 24 hours 01/27/12 2152 01/29/12 2359   01/27/12 1915   cefTRIAXone (ROCEPHIN) 1 g in dextrose 5 % 50 mL IVPB        1 g 100 mL/hr over 30 Minutes Intravenous  Once 01/27/12 1914  01/27/12 1957          Assessment/Plan: s/p Procedure(s) (LRB): COLON RESECTION SIGMOID (N/A) 1. Continue with full liquid diet. 2. Remove RUQ JP drain  LOS: 42 days    Daya Dutt 03/09/2012

## 2012-03-09 NOTE — Progress Notes (Signed)
Physical Therapy Treatment Patient Details Name: Vanessa [name] MRN: [id number] DOB: December 24, 1975 Today's Date: 03/09/2012 Time: 4098-1191 PT Time Calculation (min): 17 min  PT Assessment / Plan / Recommendation Comments on Treatment Session  Making good progress from initial eval. Pt aware that she should continue with HEP that was given to her today so that we can continue to progress her strength and endurance for ambulation. Pt also aware that she needs to be ambulating daily with nursing staff.     Follow Up Recommendations  Home health PT    Barriers to Discharge        Equipment Recommendations  None recommended by PT    Recommendations for Other Services    Frequency Min 3X/week   Plan Discharge plan remains appropriate;Frequency remains appropriate    Precautions / Restrictions Precautions Precautions: Fall       Mobility  Transfers Sit to Stand: 5: Supervision;With upper extremity assist;From chair/3-in-1 Stand to Sit: 5: Supervision;With upper extremity assist;To chair/3-in-1 Details for Transfer Assistance: slow to rise, stabilizes independently Ambulation/Gait Ambulation/Gait Assistance: 4: Min guard;4: Min assist Ambulation Distance (Feet): 200 Feet Assistive device:  (pushing IV pole) Ambulation/Gait Assistance Details: close gaurding for safety Gait Pattern: Trunk flexed;Narrow base of support;Shuffle;Decreased stride length General Gait Details: floppy easily fatigued gait quality    Exercises General Exercises - Lower Extremity Long Arc Quad: AROM;Both;10 reps;Seated Toe Raises: AROM;Both;20 reps;Seated Heel Raises: AROM;20 reps;Seated    PT Goals Acute Rehab PT Goals PT Goal: Sit to Stand - Progress: Progressing toward goal PT Goal: Stand to Sit - Progress: Progressing toward goal PT Goal: Ambulate - Progress: Progressing toward goal  Visit Information  Last PT Received On: 03/09/12 Assistance Needed: +1    Subjective Data  Subjective: I  want to wait until after my pain medicine.    Cognition  Overall Cognitive Status: Appears within functional limits for tasks assessed/performed Arousal/Alertness: Awake/alert Orientation Level: Appears intact for tasks assessed Behavior During Session: [facility] for tasks performed    Balance     End of Session PT - End of Session Equipment Utilized During Treatment: Gait belt Activity Tolerance: Patient limited by fatigue;Patient tolerated treatment well Patient left: in chair Nurse Communication: Mobility status    WHITLOW,Jatniel Verastegui HELEN 03/09/2012, 12:52 PM

## 2012-03-09 NOTE — Progress Notes (Signed)
Nutrition Follow-up  Diet Order:  Full liquids  Pt requesting diet advancement to solid foods. RD discussed current goals of diet with NP who states surgeon will need to assess drains prior to diet advancement.  One of pt's drains with questionable output- ?fecal matter. If slow diet advancement preferred, could consider adding soft solids slowly to increased variety, nutritional quality, and trial tolerance.  Food items such as mashed potatoes and applesauce could be added to diet order to test tolerance.  Pt continues Clinimix 5/20 @ 50 mL/hr, no lipids which provides 1056 kcal, 60g protein which meets 58% kcal needs and 75% protein needs.  Meds: Scheduled Meds:   . antiseptic oral rinse  15 mL Mouth Rinse QID  . barrier cream  1 application Topical TID  . chlorhexidine  15 mL Mouth/Throat BID  . ciprofloxacin  400 mg Intravenous Q12H  . feeding supplement  1 Container Oral TID BM  . ferrous fumarate  1 tablet Oral Daily  . folic acid  1 mg Oral Daily  . gatorade (BH)  240 mL Oral TID  . lidocaine   Topical QID  . magnesium sulfate 1 - 4 g bolus IVPB  4 g Intravenous Once  . metoCLOPramide (REGLAN) injection  10 mg Intravenous Q8H  . metronidazole  500 mg Intravenous Q8H  . oxyCODONE  20 mg Oral BID  . pantoprazole (PROTONIX) IV  40 mg Intravenous Q24H  . prochlorperazine  5 mg Intravenous QID  . sodium chloride  10-40 mL Intracatheter Q12H  . white petrolatum      . DISCONTD: alteplase  2 mg Intracatheter Once  . DISCONTD: lidocaine   Topical TID   Continuous Infusions:   . sodium chloride 20 mL/hr at 03/07/12 1237  . TPN (CLINIMIX) +/- additives 50 mL/hr at 03/07/12 1735  . TPN (CLINIMIX) +/- additives 50 mL/hr at 03/08/12 1751  . TPN (CLINIMIX) +/- additives     PRN Meds:.sodium chloride, acetaminophen, diphenhydrAMINE, hydrALAZINE, HYDROmorphone (DILAUDID) injection, LORazepam, ondansetron (ZOFRAN) IV, ondansetron, promethazine, sodium chloride, zolpidem  Labs:  CMP    Component Value Date/Time   NA 131* 03/09/2012 0508   K 4.0 03/09/2012 0508   CL 100 03/09/2012 0508   CO2 25 03/09/2012 0508   GLUCOSE 101* 03/09/2012 0508   BUN 6 03/09/2012 0508   CREATININE 0.45* 03/09/2012 0508   CALCIUM 10.4 03/09/2012 0508   CALCIUM 10.7* 02/01/2012 0705   PROT 6.4 03/09/2012 0508   ALBUMIN 2.0* 03/09/2012 0508   AST 11 03/09/2012 0508   ALT 9 03/09/2012 0508   ALKPHOS 85 03/09/2012 0508   BILITOT 0.3 03/09/2012 0508   GFRNONAA >90 03/09/2012 0508   GFRAA >90 03/09/2012 0508     Intake/Output Summary (Last 24 hours) at 03/09/12 1315 Last data filed at 03/09/12 0900  Gross per 24 hour  Intake   2386 ml  Output    490 ml  Net   1896 ml    Weight Status:  Overall stable Admission wt: 158 lbs  Restatement needs:  1800-2000 kcal, 80-90g protein  Nutrition Dx:  Inadequate oral intake, ongoing  Intervention:   1.  Parenteral nutrition; wean per PharmD.  Agree with slow wean due to slow diet advancement with questionable tolerance. 2.  Modify diet; per MD discretion.  Recommend addition of soft solids if appropriate.  May limit to specific menu items such as mashed/baked potato, applesauce initially to ease into solids. 3.  Supplements; pt declines at this time  Monitor:  1.  Parenteral nutrition; conitnued tolerance.  Met continue. 2.  Food/Beverage; diet advancement with tolerance once appropriate per MD.   Hoyt Koch Pager #:  [phone]

## 2012-03-09 NOTE — Progress Notes (Signed)
Clinical Social Work  CSW met with patient to discuss plans. Patient reports that she has spoken to family and she feels that family will be able to assist her with needs at dc. CSW spoke with patient about SNF option if she was not comfortable going home. CSW explained SNF process and CSW role. Patient reports that she prefers to return home to be with family but is understanding of SNF option if needed. CSW is signing off but available if needed.  [place], [place] [phone]

## 2012-03-09 NOTE — Progress Notes (Signed)
Will start removing drains and slowly advance diet

## 2012-03-10 DIAGNOSIS — K631 Perforation of intestine (nontraumatic): Secondary | ICD-10-CM

## 2012-03-10 DIAGNOSIS — K651 Peritoneal abscess: Secondary | ICD-10-CM

## 2012-03-10 DIAGNOSIS — M311 Thrombotic microangiopathy: Secondary | ICD-10-CM

## 2012-03-10 MED ORDER — OXYCODONE-ACETAMINOPHEN 5-325 MG PO TABS
1.0000 | ORAL_TABLET | ORAL | Status: DC | PRN
  Administered 2012-03-10 – 2012-03-12 (×2): 2 via ORAL
  Filled 2012-03-10 (×2): qty 2

## 2012-03-10 MED ORDER — CLINIMIX E/DEXTROSE (5/20) 5 % IV SOLN
INTRAVENOUS | Status: AC
  Administered 2012-03-10: 18:00:00 via INTRAVENOUS
  Filled 2012-03-10: qty 2000

## 2012-03-10 NOTE — Progress Notes (Signed)
[name] f/u internal medicine consult   Interval History: 36 y/o F with Hx of cervical cancer s/p chemo / radiation (2011). Patient was seen in the emergency room on May 5 with abdominal pain nausea vomiting. She was diagnosed as UTI and sent home on medication. She subsequently was admitted 5/6 with abd pain and n/v. After admission was found to have a sigmoid colon stricture found on colonoscopy, CT and BE at 20 cm. Pt has a history of cervical cancer and radiation in 2011. Many week history of nausea and vomiting.  High grade stricture noted. .Colonoscopy also showed mild proctitis. BE shows 95 % narrowing at 20 cm from anal verge. She underwent colectomy and diverting ileostomy (02/04/12) and her path showed radiation changes. She underwent repeat CT 5-29 which was worrisome for anastomotic breakdown. Several abscesses were identified and she subsequently underwent percutaneous drain placement x4 in interventional radiology on 02/13/2012. By 02/19/2012 she developed bloody stools with clots, this was in association with the development of profound thrombocytopenia. Platelets were as low as less than 5. Hematology was consulted. Was felt that thrombocytopenia etiology was due to consumption but likely related to beta lactams. There was no evidence of TTP, hit or DIC. In addition to receiving platelets empiric treatment for ITP with steroids as well as IV Ig was given the patient did not have response. She also failed Amicar treatment. She has subsequently stabilized enough to be transferred out of the intensive care unit to the step down unit. Pulmonary critical care medicine had been consulted while the patient was in the intensive care unit and has transferred medicine consultation care over to triad hospitalists on 6/13 .  Subjective: Excited about drains being pulled out. Eating regular food  Objective: Blood pressure 136/91, pulse 101, temperature 98.5 F (36.9 C), temperature source Oral, resp. rate  18, height 5\' 4"  (1.626 m), weight 68.176 kg (150 lb 4.8 oz), SpO2 100.00%. Patient Vitals for the past 24 hrs:  BP Temp Temp src Pulse Resp SpO2  03/10/12 1451 136/91 mmHg 98.5 F (36.9 C) Oral 101  18  100 %  03/10/12 0556 124/77 mmHg 98.5 F (36.9 C) Oral 91  18  100 %  03/09/12 2139 130/83 mmHg 98.9 F (37.2 C) Oral 90  18  100 %   alert and oriented x3 Heart regular rate and rhythm without murmurs rubs gallops   Intake/Output from previous day: 06/17 0701 - 06/18 0700 In: 703 [P.O.:240; TPN:463] Out: 220 [Drains:170; Stool:50] Intake/Output this shift:    Lab Results:  [facility] 03/09/12 0508  WBC 8.8  HGB 8.1*  HCT 25.0*  PLT 200   BMET  Basename 03/09/12 0508 03/08/12 0500  NA 131* 131*  K 4.0 3.7  CL 100 99  CO2 25 24  GLUCOSE 101* 102*  BUN 6 6  CREATININE 0.45* 0.40*  CALCIUM 10.4 10.4   Medications:     . antiseptic oral rinse  15 mL Mouth Rinse QID  . barrier cream  1 application Topical TID  . chlorhexidine  15 mL Mouth/Throat BID  . ciprofloxacin  400 mg Intravenous Q12H  . feeding supplement  1 Container Oral TID BM  . ferrous fumarate  1 tablet Oral Daily  . folic acid  1 mg Oral Daily  . gatorade (BH)  240 mL Oral TID  . lidocaine   Topical QID  . metoCLOPramide (REGLAN) injection  10 mg Intravenous Q8H  . metronidazole  500 mg Intravenous Q8H  . oxyCODONE  20 mg Oral BID  . pantoprazole (PROTONIX) IV  40 mg Intravenous Q24H  . prochlorperazine  5 mg Intravenous QID  . 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 leak/IA abscesss *Per primary team - General Surgery  Thrombocytopenia due to medication *Platelets have now normalized *Primary etiology felt to be beta lactams which were discontinued *Also process related to consumption from acute septic illness and likely from gram-negative pathogens due diffuse intra-abdominal abscesses *No  evidence of ITP, HIT or true DIC *Status post transfusion of 13 units pheresed platelets this admission *Status post transfusion of 4 units FFP this admission  Ileus following gastrointestinal surgery/Nausea & vomiting/On total parenteral nutrition *Management per Gen Surgery - diet has been advanced to regular diet  If she tolerates diet consider weaning tna off  Post-radiation rectal bleeding due to colitis Was Exacerbated by profound thrombocytopenia  now has Resolved once platelets are ok  Acute posthemorrhagic anemia *Hemoglobin stable  *Nadir this admission was 5.6 *Status post transfusion 27 units of packed red blood cells his admit *Baseline hemaglobin prior to this admit between 9.5 and 10 dating back to June 2012  Cervical cancer S/p radiation    Hypercalcemia *Was noted at time of admission on 01/27/2012 and felt at that time to be related to hydrochlorothiazide + volume deplete *Calcium on 03/08/12 is 10.4 *No apparent recurrence of malignancy has been identified with recent surgical procedures  Disposition *At discretion of primary surgical team  *[name] will continue to follow as a Medical Consult   LOS: 43 days   Theophilus Walz

## 2012-03-10 NOTE — Progress Notes (Signed)
PT Cancellation Note  Treatment cancelled today due to medical issues with patient which prohibited therapy. Pt with n/v declining participating with PT this morning. Will f/u as schedule allows.   [facility] HELEN 03/10/2012, 10:11 AM Pager: [phone]

## 2012-03-10 NOTE — Progress Notes (Signed)
PARENTERAL NUTRITION CONSULT NOTE - FOLLOW UP  Pharmacy Consult for TPN Indication: intraabdominal abscess, ileus  Allergies  Allergen Reactions  . Labetalol Hcl Itching and Other (See Comments)    Bumps to bilateral arms.  . Morphine And Related Hives   Patient Measurements: Height: 5\' 4"  (162.6 cm) Weight: 150 lb 4.8 oz (68.176 kg) IBW/kg (Calculated) : 54.7   Vital Signs: Temp: 98.5 F (36.9 C) (06/18 0556) Temp src: Oral (06/18 0556) BP: 124/77 mmHg (06/18 0556) Pulse Rate: 91  (06/18 0556) Intake/Output from previous day: 06/17 0701 - 06/18 0700 In: 703 [P.O.:240; TPN:463] Out: 220 [Drains:170; Stool:50] Intake/Output from this shift:  Labs:  [facility] 03/09/12 0508  WBC 8.8  HGB 8.1*  HCT 25.0*  PLT 200  APTT --  INR --    Basename 03/09/12 0508 03/08/12 0500  NA 131* 131*  K 4.0 3.7  CL 100 99  CO2 25 24  GLUCOSE 101* 102*  BUN 6 6  CREATININE 0.45* 0.40*  LABCREA -- --  CREAT24HRUR -- --  CALCIUM 10.4 10.4  MG 1.7 1.6  PHOS 3.2 --  PROT 6.4 --  ALBUMIN 2.0* --  AST 11 --  ALT 9 --  ALKPHOS 85 --  BILITOT 0.3 --  BILIDIR -- --  IBILI -- --  PREALBUMIN -- 18.4  TRIG 70 --  CHOLHDL -- --  CHOL 61 --   Estimated Creatinine Clearance: 92.2 ml/min (by C-G formula based on Cr of 0.45).  No results found for this basename: GLUCAP:3 in the last 72 hours  Insulin Requirements in the past 24 hours:  SSI, CBG checks d/c'd d/t minimal SSI needs.  NO insulin in TNA    Current Nutrition:  - Clinimix E5/20 at 70 ml/hr (GOAL) - Clinimix currently running at 15ml/hr - Have been avoiding lipids due to possible contribution to thrombocytopenia?? - Full liquids  Nutritional Goals:  1800-2000 kCal, 80-95 grams of protein per day.    Clinimix E5/20 goal rate of 10ml/hr will provide an average of 1478 kcal and 84g protein per day based on RD assessment (82% total kcal and 100% protein needs). Current rate 50 ml/hr = 1056 kcal and 60g  protein  Assessment: 62 yof with a history of cervical cancer who is s/p sigmoid colectomy and diverting ileostomy on 5/14. Developed multiple intra-abdominal abscesses d/t anastomotic leak and had 3 perc drains placed.   5/29: patient had multiple bloody BM, hemoptysis, and abdominal pain.  5/30: CT showed additional abscesses and concern for fistula formation.  6/6: CT: persistent abscesses.  6/7: New peritoneal drain placed by IR. Plts dropped to <5 (240k on 5/25) but have now improved to 200  GI:  Advanced to full liquid diet with resource TID BM and gatorade TID. 240cc po intaked noted yesterday. Prealbumin dropped slightly to 18.4. Meds: Reglan 10 IV/8h, Compazine 5mg  IV/QID, IV PPI  Endo: No hx DM. CBG controlled. SSI, CBG checks d/c'd 6/10.   Lytes:  Na low - ?Fluid o/load. NS at [facility]. Corr Ca 12.04. Target K ~4 and Mg ~2. No new labs today  Renal: SCr 0.45, UOP not recorded  Cards:VSS  Hepatobil: Alk phos/LFTs/bili, triglycerides 70 and cholesterol WNL. Prealbumin WNL  (21.5 on 6/9 down to 18.4 6/16)  Neuro: pain control issues continue, PCA off, pt now on prn IV dilaudid and oxycontin. Pain score up around 6 since midnight. May need to consider increasing Oxycontin tomorrow.  Heme/Onc: Thrombocytopenia resolved with steroids, IVIG, N-Plate, tranexamic acid, amicar, and  discontinuation of multiple offending meds (beta-lactams?). Noted several blood product transfusions to date (13 units platelets, 4 units FFP, 27 units PRBC).  Hgb low, but stable. Ferrous fumarate added.    ID:D#8 cipro/flagyl - restarted for peritoneal abscesses with polymicrobial bacteria from 6/7 cx and  leukocytosis; WBC stable; afebrile.    Best Practices: SCDs.    Plan:  - Recheck electrolytes tomorrow. - Continue Clinimix E 5/20 at 75% goal (50 ml/hr). Noted MD plans to assess prealbumin before d/c TPN-also would like to see significant PO intake (>60% goal) before d/c TPN) - MVI and Trace elements MWF  only d/t national shortage - May resume lipids tomorrow now that thrombocytopenia resolved. (MWF only)    Vanessa [name], PharmD, [facility] Clinical Staff Pharmacist Pager [phone]  03/10/2012 10:12 AM

## 2012-03-10 NOTE — Progress Notes (Signed)
Physical Therapy Treatment Patient Details Name: Vanessa [name] MRN: [id number] DOB: 1976-02-27 Today's Date: 03/10/2012 Time: 4098-1191 PT Time Calculation (min): 25 min  PT Assessment / Plan / Recommendation Comments on Treatment Session  Pt with a great attitude this afternoon as she is excited about possibly going home this Friday. Will try to see her often to make sure she doesn't need the RW when going home because I think she will progress past it quickly. Still needs to be ambulating daily with staff, she knows this. She is aware to perform the standing exercises only with PT staff. Pt able to walk outside on the balcony today which brightened her spirits tremendously.     Follow Up Recommendations  Home health PT;Supervision for mobility/OOB    Barriers to Discharge        Equipment Recommendations  None recommended by PT    Recommendations for Other Services    Frequency Min 3X/week (+)   Plan Discharge plan remains appropriate;Frequency remains appropriate    Precautions / Restrictions Precautions Precautions: Fall       Mobility  Bed Mobility Bed Mobility: Supine to Sit Supine to Sit: 6: Modified independent (Device/Increase time) Transfers Sit to Stand: 4: Min guard;Without upper extremity assist;From bed Stand to Sit: Without upper extremity assist;With armrests;To chair/3-in-1;To bed;5: Supervision Details for Transfer Assistance: sit->stand x5 for strengthening (pt relying on the backs of her legs to stabilize) Ambulation/Gait Ambulation/Gait Assistance: 4: Min guard;4: Min assist (minA only without RW and when fatigued) Ambulation Distance (Feet): 220 Feet Assistive device: None Ambulation/Gait Assistance Details: pushing IV pole (supervision), with no AD (mingaurd, minA) pt staggering a bit with decreased stepping/balance reaction Gait Pattern: Shuffle;Decreased trunk rotation;Narrow base of support General Gait Details: still staggered, relying heavily  on IV pole initially but then took the IV pole away and pt staggering a bit more    Exercises General Exercises - Lower Extremity Long Arc Quad: AROM;Both;10 reps;Seated Toe Raises: AROM;20 reps;Seated Heel Raises: AROM;20 reps;Seated Other Exercises Other Exercises: sit<->stand x5 for strengthening (using the backs of her legs to stabilize against the bed); mingaurdA     PT Goals Acute Rehab PT Goals PT Goal: Supine/Side to Sit - Progress: Met PT Goal: Sit to Stand - Progress: Progressing toward goal PT Goal: Stand to Sit - Progress: Progressing toward goal PT Goal: Ambulate - Progress: Progressing toward goal  Visit Information  Last PT Received On: 03/10/12 Assistance Needed: +1    Subjective Data  Subjective: I think I get to go home at the end of this week!   Cognition  Overall Cognitive Status: Appears within functional limits for tasks assessed/performed Arousal/Alertness: Awake/alert Orientation Level: Appears intact for tasks assessed Behavior During Session: [facility] for tasks performed Cognition - Other Comments: a little slower cognitively but appears oriented    Balance     End of Session PT - End of Session Equipment Utilized During Treatment: Gait belt Activity Tolerance: Patient tolerated treatment well;Patient limited by fatigue Patient left: in chair;with call bell/phone within reach;with family/visitor present Nurse Communication: Other (comment) (IV beeping)    WHITLOW,Zerah Hilyer HELEN 03/10/2012, 2:53 PM

## 2012-03-10 NOTE — Progress Notes (Signed)
Patient ID: Vanessa [name], female   DOB: 11-26-1975, 36 y.o.   MRN: [id number] 35 Days Post-Op  Subjective: Doing well, would like "some real food!" no c/o of pain or nausea. But doing well with low residue diet.  Objective: Vital signs in last 24 hours: Temp:  [98.3 F (36.8 C)-98.9 F (37.2 C)] 98.5 F (36.9 C) (06/18 0556) Pulse Rate:  [84-91] 91  (06/18 0556) Resp:  [17-18] 18  (06/18 0556) BP: (120-130)/(73-83) 124/77 mmHg (06/18 0556) SpO2:  [100 %] 100 % (06/18 0556) Last BM Date: 03/10/12  Intake/Output from previous day: 06/17 0701 - 06/18 0700 In: 703 [P.O.:240; TPN:463] Out: 220 [Drains:170; Stool:50] Intake/Output this shift: Total I/O In: 160 [P.O.:160] Out: -    Abdomen is soft,non-tender,positive bowel sounds, illeostomy is drng dark fluid, JP's are currently empty, last abdominal drain is drng purulent tan fluid. Does not appear to be any significant increase in out put in last abdominal drain since changing over to low residue diet. Will continue to monitor.  Lab Results:   [facility] 03/09/12 0508  WBC 8.8  HGB 8.1*  HCT 25.0*  PLT 200   BMET  Basename 03/09/12 0508 03/08/12 0500  NA 131* 131*  K 4.0 3.7  CL 100 99  CO2 25 24  GLUCOSE 101* 102*  BUN 6 6  CREATININE 0.45* 0.40*  CALCIUM 10.4 10.4   PT/INR No results found for this basename: LABPROT:2,INR:2 in the last 72 hours ABG No results found for this basename: PHART:2,PCO2:2,PO2:2,HCO3:2 in the last 72 hours  Studies/Results: No results found.  Anti-infectives: Anti-infectives     Start     Dose/Rate Route Frequency Ordered Stop   03/03/12 1200   metroNIDAZOLE (FLAGYL) IVPB 500 mg        500 mg 100 mL/hr over 60 Minutes Intravenous Every 8 hours 03/03/12 0957     03/03/12 1100   ciprofloxacin (CIPRO) IVPB 400 mg        400 mg 200 mL/hr over 60 Minutes Intravenous Every 12 hours 03/03/12 0957     02/24/12 1600   levofloxacin (LEVAQUIN) IVPB 750 mg  Status:  Discontinued       750 mg 100 mL/hr over 90 Minutes Intravenous Every 24 hours 02/24/12 1526 02/26/12 1012   02/20/12 1415   cefTRIAXone (ROCEPHIN) 1 g in dextrose 5 % 50 mL IVPB  Status:  Discontinued        1 g 100 mL/hr over 30 Minutes Intravenous Every 24 hours 02/20/12 1404 02/24/12 1526   02/19/12 1800   ciprofloxacin (CIPRO) IVPB 400 mg  Status:  Discontinued        400 mg 200 mL/hr over 60 Minutes Intravenous Every 12 hours 02/19/12 1612 02/20/12 1404   02/19/12 1700   metroNIDAZOLE (FLAGYL) IVPB 500 mg  Status:  Discontinued        500 mg 100 mL/hr over 60 Minutes Intravenous 3 times per day 02/19/12 1612 02/26/12 1012   02/12/12 0800   piperacillin-tazobactam (ZOSYN) IVPB 3.375 g  Status:  Discontinued        3.375 g 12.5 mL/hr over 240 Minutes Intravenous 3 times per day 02/12/12 0800 02/19/12 1612   02/10/12 1000   amoxicillin-clavulanate (AUGMENTIN) 875-125 MG per tablet 1 tablet  Status:  Discontinued        1 tablet Oral Every 12 hours 02/10/12 0823 02/12/12 0800   02/04/12 1800   cefOXitin (MEFOXIN) 1 g in dextrose 5 % 50 mL IVPB  1 g 100 mL/hr over 30 Minutes Intravenous Every 6 hours 02/04/12 1621 02/05/12 0617   02/04/12 0000   cefOXitin (MEFOXIN) 2 g in dextrose 5 % 50 mL IVPB        2 g 100 mL/hr over 30 Minutes Intravenous 60 min pre-op 02/03/12 0333 02/04/12 1145   02/03/12 0500   cefOXitin (MEFOXIN) 2 g in dextrose 5 % 50 mL IVPB  Status:  Discontinued        2 g 100 mL/hr over 30 Minutes Intravenous 60 min pre-op 02/02/12 1156 02/03/12 0333   02/03/12 0000   cefOXitin (MEFOXIN) 2 g in dextrose 5 % 50 mL IVPB  Status:  Discontinued        2 g 100 mL/hr over 30 Minutes Intravenous 60 min pre-op 02/02/12 1004 02/02/12 1156   01/27/12 2200   cefTRIAXone (ROCEPHIN) 1 g in dextrose 5 % 50 mL IVPB        1 g 100 mL/hr over 30 Minutes Intravenous Every 24 hours 01/27/12 2152 01/29/12 2359   01/27/12 1915   cefTRIAXone (ROCEPHIN) 1 g in dextrose 5 % 50 mL IVPB         1 g 100 mL/hr over 30 Minutes Intravenous  Once 01/27/12 1914 01/27/12 1957          Assessment/Plan: s/p Procedure(s) (LRB): COLON RESECTION SIGMOID (N/A) 1. Continue with low residue diet; monitor output from abdominal drain. 2. Remove LUQ JP drain  LOS: 43 days    Vanessa [name] 03/10/2012

## 2012-03-10 NOTE — Consult Note (Signed)
Ostomy follow-up:  Pt basically independent with pouching activities.  Pouch changed yesterday, pt denies further questions or need for assistance.  States, "I have been showing nurses how to change it." Has been placed on [place] discharge program.  Reviewed pouching routines, dietary precautions, and ordering supplies.  Pouching supplies ordered to keep in room for patient use. Will not plan to follow further unless re-consulted.  [address] RN, MSN, [employer]  [phone]

## 2012-03-10 NOTE — Progress Notes (Signed)
Seems to be doing well one more drain out earlier today and will get last [place] tomorrow, but will need to keep the one draining brown fluid

## 2012-03-10 NOTE — Progress Notes (Signed)
35 Days Post-Op  Subjective: Feeling better overall, in good spirits a drains starting to be discontinued.   Objective: Vital signs in last 24 hours: Temp:  [98.3 F (36.8 C)-98.9 F (37.2 C)] 98.5 F (36.9 C) (06/18 0556) Pulse Rate:  [84-93] 91  (06/18 0556) Resp:  [17-19] 18  (06/18 0556) BP: (120-130)/(73-83) 124/77 mmHg (06/18 0556) SpO2:  [96 %-100 %] 100 % (06/18 0556) Last BM Date: 03/10/12  Intake/Output from previous day: 06/17 0701 - 06/18 0700 In: 703 [P.O.:240; TPN:463] Out: 220 [Drains:170; Stool:50]  Physical examination : Awake and alert in bed.  2 drains have been removed by surgery.  LLQ remains intact with feculent/purulent drainage.  JP bulb on left has been emptied - serous appearing fluid in tubing.  All sites clean and dry with clean dressings.  Per patient - surgery to remove left JP drain tomorrow given minimal output last few days   Lab Results:   [facility] 03/09/12 0508  WBC 8.8  HGB 8.1*  HCT 25.0*  PLT 200   BMET  Basename 03/09/12 0508 03/08/12 0500  NA 131* 131*  K 4.0 3.7  CL 100 99  CO2 25 24  GLUCOSE 101* 102*  BUN 6 6  CREATININE 0.45* 0.40*  CALCIUM 10.4 10.4    Anti-infectives: Anti-infectives     Start     Dose/Rate Route Frequency Ordered Stop   03/03/12 1200   metroNIDAZOLE (FLAGYL) IVPB 500 mg        500 mg 100 mL/hr over 60 Minutes Intravenous Every 8 hours 03/03/12 0957     03/03/12 1100   ciprofloxacin (CIPRO) IVPB 400 mg        400 mg 200 mL/hr over 60 Minutes Intravenous Every 12 hours 03/03/12 0957     02/24/12 1600   levofloxacin (LEVAQUIN) IVPB 750 mg  Status:  Discontinued        750 mg 100 mL/hr over 90 Minutes Intravenous Every 24 hours 02/24/12 1526 02/26/12 1012   02/20/12 1415   cefTRIAXone (ROCEPHIN) 1 g in dextrose 5 % 50 mL IVPB  Status:  Discontinued        1 g 100 mL/hr over 30 Minutes Intravenous Every 24 hours 02/20/12 1404 02/24/12 1526   02/19/12 1800   ciprofloxacin (CIPRO) IVPB 400 mg   Status:  Discontinued        400 mg 200 mL/hr over 60 Minutes Intravenous Every 12 hours 02/19/12 1612 02/20/12 1404   02/19/12 1700   metroNIDAZOLE (FLAGYL) IVPB 500 mg  Status:  Discontinued        500 mg 100 mL/hr over 60 Minutes Intravenous 3 times per day 02/19/12 1612 02/26/12 1012   02/12/12 0800   piperacillin-tazobactam (ZOSYN) IVPB 3.375 g  Status:  Discontinued        3.375 g 12.5 mL/hr over 240 Minutes Intravenous 3 times per day 02/12/12 0800 02/19/12 1612   02/10/12 1000   amoxicillin-clavulanate (AUGMENTIN) 875-125 MG per tablet 1 tablet  Status:  Discontinued        1 tablet Oral Every 12 hours 02/10/12 0823 02/12/12 0800   02/04/12 1800   cefOXitin (MEFOXIN) 1 g in dextrose 5 % 50 mL IVPB        1 g 100 mL/hr over 30 Minutes Intravenous Every 6 hours 02/04/12 1621 02/05/12 0617   02/04/12 0000   cefOXitin (MEFOXIN) 2 g in dextrose 5 % 50 mL IVPB        2 g 100 mL/hr  over 30 Minutes Intravenous 60 min pre-op 02/03/12 0333 02/04/12 1145   02/03/12 0500   cefOXitin (MEFOXIN) 2 g in dextrose 5 % 50 mL IVPB  Status:  Discontinued        2 g 100 mL/hr over 30 Minutes Intravenous 60 min pre-op 02/02/12 1156 02/03/12 0333   02/03/12 0000   cefOXitin (MEFOXIN) 2 g in dextrose 5 % 50 mL IVPB  Status:  Discontinued        2 g 100 mL/hr over 30 Minutes Intravenous 60 min pre-op 02/02/12 1004 02/02/12 1156   01/27/12 2200   cefTRIAXone (ROCEPHIN) 1 g in dextrose 5 % 50 mL IVPB        1 g 100 mL/hr over 30 Minutes Intravenous Every 24 hours 01/27/12 2152 01/29/12 2359   01/27/12 1915   cefTRIAXone (ROCEPHIN) 1 g in dextrose 5 % 50 mL IVPB        1 g 100 mL/hr over 30 Minutes Intravenous  Once 01/27/12 1914 01/27/12 1957          Assessment/Plan: Post operative abscess s/p multiple percutaneous drains.  RUQ drain removed yesterday - left upper quadrant JP  drain to be removed tomorrow per patient. Surgery mostly managing drains at this time - IR available prn- will  replace LLQ drainage bag for patient given significant odor.     LOS: 43 days    Charmaine Placido D 03/10/2012

## 2012-03-11 DIAGNOSIS — M311 Thrombotic microangiopathy, unspecified: Secondary | ICD-10-CM

## 2012-03-11 DIAGNOSIS — K631 Perforation of intestine (nontraumatic): Secondary | ICD-10-CM

## 2012-03-11 DIAGNOSIS — K651 Peritoneal abscess: Secondary | ICD-10-CM

## 2012-03-11 DIAGNOSIS — K625 Hemorrhage of anus and rectum: Secondary | ICD-10-CM

## 2012-03-11 LAB — BASIC METABOLIC PANEL
BUN: 5 mg/dL — ABNORMAL LOW (ref 6–23)
CO2: 26 mEq/L (ref 19–32)
Chloride: 101 mEq/L (ref 96–112)
Glucose, Bld: 108 mg/dL — ABNORMAL HIGH (ref 70–99)
Potassium: 3.5 mEq/L (ref 3.5–5.1)

## 2012-03-11 MED ORDER — METOCLOPRAMIDE HCL 10 MG PO TABS
10.0000 mg | ORAL_TABLET | Freq: Three times a day (TID) | ORAL | Status: DC
  Administered 2012-03-11 – 2012-03-12 (×4): 10 mg via ORAL
  Filled 2012-03-11 (×6): qty 1

## 2012-03-11 MED ORDER — PROCHLORPERAZINE MALEATE 5 MG PO TABS
5.0000 mg | ORAL_TABLET | Freq: Three times a day (TID) | ORAL | Status: DC
  Administered 2012-03-11 – 2012-03-12 (×5): 5 mg via ORAL
  Filled 2012-03-11 (×8): qty 1

## 2012-03-11 MED ORDER — ZINC TRACE METAL 1 MG/ML IV SOLN
INTRAVENOUS | Status: DC
  Administered 2012-03-11: 18:00:00 via INTRAVENOUS
  Filled 2012-03-11: qty 2000

## 2012-03-11 MED ORDER — POTASSIUM CHLORIDE 10 MEQ/100ML IV SOLN
10.0000 meq | INTRAVENOUS | Status: AC
  Administered 2012-03-11 (×5): 10 meq via INTRAVENOUS
  Filled 2012-03-11 (×7): qty 100

## 2012-03-11 MED ORDER — PANTOPRAZOLE SODIUM 40 MG PO TBEC
40.0000 mg | DELAYED_RELEASE_TABLET | Freq: Every day | ORAL | Status: DC
  Administered 2012-03-11: 40 mg via ORAL
  Filled 2012-03-11: qty 1

## 2012-03-11 MED ORDER — MAGNESIUM SULFATE 40 MG/ML IJ SOLN
4.0000 g | Freq: Once | INTRAMUSCULAR | Status: AC
  Administered 2012-03-11: 4 g via INTRAVENOUS
  Filled 2012-03-11: qty 100

## 2012-03-11 MED ORDER — FAT EMULSION 20 % IV EMUL
250.0000 mL | INTRAVENOUS | Status: DC
  Administered 2012-03-11: 250 mL via INTRAVENOUS
  Filled 2012-03-11: qty 250

## 2012-03-11 NOTE — Progress Notes (Addendum)
PARENTERAL NUTRITION CONSULT NOTE - FOLLOW UP  Pharmacy Consult for TPN Indication: intraabdominal abscess, ileus  Allergies  Allergen Reactions  . Labetalol Hcl Itching and Other (See Comments)    Bumps to bilateral arms.  . Morphine And Related Hives   Patient Measurements: Height: 5\' 4"  (162.6 cm) Weight: 150 lb 4.8 oz (68.176 kg) IBW/kg (Calculated) : 54.7   Vital Signs: Temp: 98.7 F (37.1 C) (06/18 2144) Temp src: Oral (06/18 2144) BP: 128/88 mmHg (06/18 2144) Pulse Rate: 84  (06/18 2144) Intake/Output from previous day: 06/18 0701 - 06/19 0700 In: 1180 [P.O.:400; I.V.:280; IV Piggyback:100; TPN:400] Out: 1090 [Urine:500; Drains:90; Stool:500] Intake/Output from this shift:  Labs:  [facility] 03/09/12 0508  WBC 8.8  HGB 8.1*  HCT 25.0*  PLT 200  APTT --  INR --    Basename 03/11/12 0500 03/09/12 0508  NA 132* 131*  K 3.5 4.0  CL 101 100  CO2 26 25  GLUCOSE 108* 101*  BUN 5* 6  CREATININE 0.44* 0.45*  LABCREA -- --  CREAT24HRUR -- --  CALCIUM 9.7 10.4  MG 1.6 1.7  PHOS -- 3.2  PROT -- 6.4  ALBUMIN -- 2.0*  AST -- 11  ALT -- 9  ALKPHOS -- 85  BILITOT -- 0.3  BILIDIR -- --  IBILI -- --  PREALBUMIN -- --  TRIG -- 70  CHOLHDL -- --  CHOL -- 61   Estimated Creatinine Clearance: 92.2 ml/min (by C-G formula based on Cr of 0.44).  No results found for this basename: GLUCAP:3 in the last 72 hours  Insulin Requirements in the past 24 hours:  SSI, CBG checks d/c'd d/t minimal SSI needs.  NO insulin in TNA    Current Nutrition:  - Clinimix E5/20 at 70 ml/hr (GOAL) - Clinimix currently running at 36ml/hr - Have been avoiding lipids due to possible contribution to thrombocytopenia?? - Full liquids  Nutritional Goals:  1800-2000 kCal, 80-95 grams of protein per day.    Clinimix E5/20 goal rate of 46ml/hr will provide an average of 1478 kcal and 84g protein per day based on RD assessment (82% total kcal and 100% protein needs). Current rate 50 ml/hr  = 1056 kcal and 60g protein  Assessment: 46 yof with a history of cervical cancer who is s/p sigmoid colectomy and diverting ileostomy on 5/14. Developed multiple intra-abdominal abscesses d/t anastomotic leak and had 3 perc drains placed.   5/29: patient had multiple bloody BM, hemoptysis, and abdominal pain.  5/30: CT showed additional abscesses and concern for fistula formation.  6/6: CT: persistent abscesses.  6/7: New peritoneal drain placed by IR. Plts dropped to <5 (240k on 5/25) but have now improved to 200  GI:  Advanced to regular diet with resource TID BM and gatorade TID. 400cc po intaked noted yesterday (not sure if this includes the Gatorade given TID). Prealbumin dropped slightly to 18.4. Meds: Reglan 10 IV/8h, Compazine 5mg  IV/QID, IV PPI---change to po today  Endo: No hx DM. CBG controlled. SSI, CBG checks d/c'd 6/10.   Lytes:  Na low - ?Fluid o/load. NS at [facility]. Corr Ca 11.3. Target K ~4 and Mg ~2.   Renal: SCr 0.44, UOP not recorded  Cards:VSS  Hepatobil: Alk phos/LFTs/bili, triglycerides 70 and cholesterol WNL. Prealbumin WNL  (21.5 on 6/9 down to 18.4 6/16)  Neuro: pain control issues continue, PCA off, pt now on prn IV dilaudid and oxycontin. Pain scores 3-7 yesterday but down to 3 this am. May need to consider  increasing Oxycontin. Left MD sticky note.  Heme/Onc: Thrombocytopenia resolved with steroids, IVIG, N-Plate, tranexamic acid, amicar, and discontinuation of multiple offending meds (beta-lactams?). Noted several blood product transfusions to date (13 units platelets, 4 units FFP, 27 units PRBC).  No evidence of ITP, HIT or true DIC. Hgb low, but stable. Ferrous fumarate added for posthemorrhagic anemia.   ID:D#9 cipro/flagyl - restarted for peritoneal abscesses with polymicrobial bacteria from 6/7 cx and leukocytosis; WBC stable; afebrile.    Best Practices: SCDs.    Plan:  - Supplement K and Magnesium. - Continue Clinimix E 5/20 at 75% goal (50 ml/hr).  Noted MD plans to assess prealbumin before d/c TPN-also would like to see significant PO intake (>60% goal) before d/c TPN) - MVI and Trace elements MWF only d/t national shortage - Resume lipids now hat thrombocytopenia resolved. (MWF only) - If tolerates diet, may wean TPN more tomorrow    Monroe Toure S. Merilynn Finland, PharmD, [facility] Clinical Staff Pharmacist Pager [phone] 03/11/2012 9:14 AM

## 2012-03-11 NOTE — Progress Notes (Signed)
Heme/ Onc  Stopped by to see patient, who anticipates DC hopefully tomorrow. She looks great! Please continue oral iron at DC.  Folate DCd. Jama Flavors, MD

## 2012-03-11 NOTE — Progress Notes (Signed)
Patient ID: Vanessa [name], female   DOB: 07-17-1976, 36 y.o.   MRN: [id number] Patient ID: Vanessa [name], female   DOB: Jan 24, 1976, 36 y.o.   MRN: [id number] 36 Days Post-Op  Subjective: Doing well,  no c/o of pain or nausea. doing well with low residue diet. No reported episodes of NV over past 12 hours. Objective: Vital signs in last 24 hours: Temp:  [98.5 F (36.9 C)-98.7 F (37.1 C)] 98.7 F (37.1 C) (06/18 2144) Pulse Rate:  [84-101] 84  (06/18 2144) Resp:  [18] 18  (06/18 2144) BP: (128-136)/(88-91) 128/88 mmHg (06/18 2144) SpO2:  [100 %] 100 % (06/18 2144) Last BM Date: 03/10/12  Intake/Output from previous day: 06/18 0701 - 06/19 0700 In: 1180 [P.O.:400; I.V.:280; IV Piggyback:100; TPN:400] Out: 1090 [Urine:500; Drains:90; Stool:500] Intake/Output this shift:     Abdomen is soft,non-tender,positive bowel sounds, illeostomy is drng dark fluid, JP is empty, last abdominal drain is drng purulent tan fluid. Does not appear to be any significant increase in out put in last abdominal drain since changing over to low residue diet. Will continue to monitor.  Lab Results:   [facility] 03/09/12 0508  WBC 8.8  HGB 8.1*  HCT 25.0*  PLT 200   BMET  Basename 03/11/12 0500 03/09/12 0508  NA 132* 131*  K 3.5 4.0  CL 101 100  CO2 26 25  GLUCOSE 108* 101*  BUN 5* 6  CREATININE 0.44* 0.45*  CALCIUM 9.7 10.4   PT/INR No results found for this basename: LABPROT:2,INR:2 in the last 72 hours ABG No results found for this basename: PHART:2,PCO2:2,PO2:2,HCO3:2 in the last 72 hours  Studies/Results: No results found.  Anti-infectives: Anti-infectives     Start     Dose/Rate Route Frequency Ordered Stop   03/03/12 1200   metroNIDAZOLE (FLAGYL) IVPB 500 mg        500 mg 100 mL/hr over 60 Minutes Intravenous Every 8 hours 03/03/12 0957     03/03/12 1100   ciprofloxacin (CIPRO) IVPB 400 mg        400 mg 200 mL/hr over 60 Minutes Intravenous Every 12 hours 03/03/12 0957      02/24/12 1600   levofloxacin (LEVAQUIN) IVPB 750 mg  Status:  Discontinued        750 mg 100 mL/hr over 90 Minutes Intravenous Every 24 hours 02/24/12 1526 02/26/12 1012   02/20/12 1415   cefTRIAXone (ROCEPHIN) 1 g in dextrose 5 % 50 mL IVPB  Status:  Discontinued        1 g 100 mL/hr over 30 Minutes Intravenous Every 24 hours 02/20/12 1404 02/24/12 1526   02/19/12 1800   ciprofloxacin (CIPRO) IVPB 400 mg  Status:  Discontinued        400 mg 200 mL/hr over 60 Minutes Intravenous Every 12 hours 02/19/12 1612 02/20/12 1404   02/19/12 1700   metroNIDAZOLE (FLAGYL) IVPB 500 mg  Status:  Discontinued        500 mg 100 mL/hr over 60 Minutes Intravenous 3 times per day 02/19/12 1612 02/26/12 1012   02/12/12 0800   piperacillin-tazobactam (ZOSYN) IVPB 3.375 g  Status:  Discontinued        3.375 g 12.5 mL/hr over 240 Minutes Intravenous 3 times per day 02/12/12 0800 02/19/12 1612   02/10/12 1000   amoxicillin-clavulanate (AUGMENTIN) 875-125 MG per tablet 1 tablet  Status:  Discontinued        1 tablet Oral Every 12 hours 02/10/12 0823 02/12/12 0800  02/04/12 1800   cefOXitin (MEFOXIN) 1 g in dextrose 5 % 50 mL IVPB        1 g 100 mL/hr over 30 Minutes Intravenous Every 6 hours 02/04/12 1621 02/05/12 0617   02/04/12 0000   cefOXitin (MEFOXIN) 2 g in dextrose 5 % 50 mL IVPB        2 g 100 mL/hr over 30 Minutes Intravenous 60 min pre-op 02/03/12 0333 02/04/12 1145   02/03/12 0500   cefOXitin (MEFOXIN) 2 g in dextrose 5 % 50 mL IVPB  Status:  Discontinued        2 g 100 mL/hr over 30 Minutes Intravenous 60 min pre-op 02/02/12 1156 02/03/12 0333   02/03/12 0000   cefOXitin (MEFOXIN) 2 g in dextrose 5 % 50 mL IVPB  Status:  Discontinued        2 g 100 mL/hr over 30 Minutes Intravenous 60 min pre-op 02/02/12 1004 02/02/12 1156   01/27/12 2200   cefTRIAXone (ROCEPHIN) 1 g in dextrose 5 % 50 mL IVPB        1 g 100 mL/hr over 30 Minutes Intravenous Every 24 hours 01/27/12 2152 01/29/12  2359   01/27/12 1915   cefTRIAXone (ROCEPHIN) 1 g in dextrose 5 % 50 mL IVPB        1 g 100 mL/hr over 30 Minutes Intravenous  Once 01/27/12 1914 01/27/12 1957          Assessment/Plan: s/p Procedure(s) (LRB): COLON RESECTION SIGMOID (N/A) 1. Continue with low residue diet; monitor output from abdominal drain. 2. Remove LLQ JP drain 3. Dietary consult to assist patient with outpatient dietary needs.  LOS: 44 days    Constantino Starace 03/11/2012

## 2012-03-11 NOTE — Progress Notes (Signed)
Subjective: Wants to go home Denies any nausea vomiting abdominal pain Tolerating her oral regular diet  Objective: Vital signs in last 24 hours: Filed Vitals:   03/09/12 2139 03/10/12 0556 03/10/12 1451 03/10/12 2144  BP: 130/83 124/77 136/91 128/88  Pulse: 90 91 101 84  Temp: 98.9 F (37.2 C) 98.5 F (36.9 C) 98.5 F (36.9 C) 98.7 F (37.1 C)  TempSrc: Oral Oral Oral Oral  Resp: 18 18 18 18   Height:      Weight:      SpO2: 100% 100% 100% 100%    Intake/Output Summary (Last 24 hours) at 03/11/12 1134 Last data filed at 03/11/12 0946  Gross per 24 hour  Intake   1260 ml  Output   1090 ml  Net    170 ml    Weight change:  Physical examination : Awake and alert in bed. LLQ remains intact with feculent/purulent drainage. JP bulb on left has been emptied - serous appearing fluid in tubing. All sites clean and dry with clean dressings. Per patient - surgery to remove left JP drain today given minimal output last few days     Lab Results: Results for orders placed during the hospital encounter of 01/27/12 (from the past 24 hour(s))  BASIC METABOLIC PANEL     Status: Abnormal   Collection Time   03/11/12  5:00 AM      Component Value Range   Sodium 132 (*) 135 - 145 mEq/L   Potassium 3.5  3.5 - 5.1 mEq/L   Chloride 101  96 - 112 mEq/L   CO2 26  19 - 32 mEq/L   Glucose, Bld 108 (*) 70 - 99 mg/dL   BUN 5 (*) 6 - 23 mg/dL   Creatinine, Ser 4.09 (*) 0.50 - 1.10 mg/dL   Calcium 9.7  8.4 - 81.1 mg/dL   GFR calc non Af Amer >90  >90 mL/min   GFR calc Af Amer >90  >90 mL/min  MAGNESIUM     Status: Normal   Collection Time   03/11/12  5:00 AM      Component Value Range   Magnesium 1.6  1.5 - 2.5 mg/dL     Micro: No results found for this or any previous visit (from the past 240 hour(s)).  Studies/Results: No results found.  Medications:  Scheduled Meds:   . antiseptic oral rinse  15 mL Mouth Rinse QID  . barrier cream  1 application Topical TID  . chlorhexidine   15 mL Mouth/Throat BID  . ciprofloxacin  400 mg Intravenous Q12H  . feeding supplement  1 Container Oral TID BM  . ferrous fumarate  1 tablet Oral Daily  . folic acid  1 mg Oral Daily  . gatorade (BH)  240 mL Oral TID  . lidocaine   Topical QID  . magnesium sulfate 1 - 4 g bolus IVPB  4 g Intravenous Once  . metoCLOPramide  10 mg Oral TID AC  . metronidazole  500 mg Intravenous Q8H  . oxyCODONE  20 mg Oral BID  . pantoprazole  40 mg Oral Q1200  . potassium chloride  10 mEq Intravenous Q1 Hr x 5  . prochlorperazine  5 mg Oral TID WC & HS  . sodium chloride  10-40 mL Intracatheter Q12H  . DISCONTD: metoCLOPramide (REGLAN) injection  10 mg Intravenous Q8H  . DISCONTD: pantoprazole (PROTONIX) IV  40 mg Intravenous Q24H  . DISCONTD: prochlorperazine  5 mg Intravenous QID   Continuous Infusions:   .  sodium chloride 20 mL/hr at 03/07/12 1237  . fat emulsion    . TPN (CLINIMIX) +/- additives 50 mL/hr at 03/09/12 1801  . TPN (CLINIMIX) +/- additives 50 mL/hr at 03/10/12 1757  . TPN (CLINIMIX) +/- additives     PRN Meds:.sodium chloride, acetaminophen, diphenhydrAMINE, hydrALAZINE, HYDROmorphone (DILAUDID) injection, LORazepam, ondansetron (ZOFRAN) IV, ondansetron, oxyCODONE-acetaminophen, promethazine, sodium chloride, zolpidem   Assessment: Principal Problem:  *Sigmoid stricture Active Problems:  Cervical cancer  Post-radiation rectal bleeding  Pyelonephritis  Nausea & vomiting  Hypercalcemia  Radiation colitis  Hemorrhage of rectum and anus  Acute posthemorrhagic anemia  Thrombocytopenia due to medication  S/P left/sigmoid colectomy with anastomotic leak- ileostomy  Ileus following gastrointestinal surgery  On total parenteral nutrition  Abscess, peritoneal   Plan:  #1 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

## 2012-03-11 NOTE — Progress Notes (Signed)
36 Days Post-Op  Subjective: Feeling better overall, excited about going home soon. JP drain to come out today.  Objective: Vital signs in last 24 hours: Temp:  [98.5 F (36.9 C)-98.7 F (37.1 C)] 98.7 F (37.1 C) (06/18 2144) Pulse Rate:  [84-101] 84  (06/18 2144) Resp:  [18] 18  (06/18 2144) BP: (128-136)/(88-91) 128/88 mmHg (06/18 2144) SpO2:  [100 %] 100 % (06/18 2144) Last BM Date: 03/11/12  Intake/Output from previous day: 06/18 0701 - 06/19 0700 In: 1180 [P.O.:400; I.V.:280; IV Piggyback:100; TPN:400] Out: 1090 [Urine:500; Drains:90; Stool:500]  Physical examination : Awake and alert in bed.  LLQ remains intact with feculent/purulent drainage.  JP bulb on left has been emptied - serous appearing fluid in tubing.  All sites clean and dry with clean dressings.  Per patient - surgery to remove left JP drain today given minimal output last few days   Lab Results:   [facility] 03/09/12 0508  WBC 8.8  HGB 8.1*  HCT 25.0*  PLT 200   BMET  Basename 03/11/12 0500 03/09/12 0508  NA 132* 131*  K 3.5 4.0  CL 101 100  CO2 26 25  GLUCOSE 108* 101*  BUN 5* 6  CREATININE 0.44* 0.45*  CALCIUM 9.7 10.4    Anti-infectives: Anti-infectives     Start     Dose/Rate Route Frequency Ordered Stop   03/03/12 1200   metroNIDAZOLE (FLAGYL) IVPB 500 mg        500 mg 100 mL/hr over 60 Minutes Intravenous Every 8 hours 03/03/12 0957     03/03/12 1100   ciprofloxacin (CIPRO) IVPB 400 mg        400 mg 200 mL/hr over 60 Minutes Intravenous Every 12 hours 03/03/12 0957     02/24/12 1600   levofloxacin (LEVAQUIN) IVPB 750 mg  Status:  Discontinued        750 mg 100 mL/hr over 90 Minutes Intravenous Every 24 hours 02/24/12 1526 02/26/12 1012   02/20/12 1415   cefTRIAXone (ROCEPHIN) 1 g in dextrose 5 % 50 mL IVPB  Status:  Discontinued        1 g 100 mL/hr over 30 Minutes Intravenous Every 24 hours 02/20/12 1404 02/24/12 1526   02/19/12 1800   ciprofloxacin (CIPRO) IVPB 400 mg  Status:   Discontinued        400 mg 200 mL/hr over 60 Minutes Intravenous Every 12 hours 02/19/12 1612 02/20/12 1404   02/19/12 1700   metroNIDAZOLE (FLAGYL) IVPB 500 mg  Status:  Discontinued        500 mg 100 mL/hr over 60 Minutes Intravenous 3 times per day 02/19/12 1612 02/26/12 1012   02/12/12 0800   piperacillin-tazobactam (ZOSYN) IVPB 3.375 g  Status:  Discontinued        3.375 g 12.5 mL/hr over 240 Minutes Intravenous 3 times per day 02/12/12 0800 02/19/12 1612   02/10/12 1000   amoxicillin-clavulanate (AUGMENTIN) 875-125 MG per tablet 1 tablet  Status:  Discontinued        1 tablet Oral Every 12 hours 02/10/12 0823 02/12/12 0800   02/04/12 1800   cefOXitin (MEFOXIN) 1 g in dextrose 5 % 50 mL IVPB        1 g 100 mL/hr over 30 Minutes Intravenous Every 6 hours 02/04/12 1621 02/05/12 0617   02/04/12 0000   cefOXitin (MEFOXIN) 2 g in dextrose 5 % 50 mL IVPB        2 g 100 mL/hr over 30 Minutes Intravenous  60 min pre-op 02/03/12 0333 02/04/12 1145   02/03/12 0500   cefOXitin (MEFOXIN) 2 g in dextrose 5 % 50 mL IVPB  Status:  Discontinued        2 g 100 mL/hr over 30 Minutes Intravenous 60 min pre-op 02/02/12 1156 02/03/12 0333   02/03/12 0000   cefOXitin (MEFOXIN) 2 g in dextrose 5 % 50 mL IVPB  Status:  Discontinued        2 g 100 mL/hr over 30 Minutes Intravenous 60 min pre-op 02/02/12 1004 02/02/12 1156   01/27/12 2200   cefTRIAXone (ROCEPHIN) 1 g in dextrose 5 % 50 mL IVPB        1 g 100 mL/hr over 30 Minutes Intravenous Every 24 hours 01/27/12 2152 01/29/12 2359   01/27/12 1915   cefTRIAXone (ROCEPHIN) 1 g in dextrose 5 % 50 mL IVPB        1 g 100 mL/hr over 30 Minutes Intravenous  Once 01/27/12 1914 01/27/12 1957          Assessment/Plan: Post operative abscess s/p multiple percutaneous drains. Left upper quadrant JP  drain to be removed. Surgery mostly managing drains at this time - IR available prn- will replace LLQ drainage bag for patient given significant odor.     LOS: 44 days    Vanessa [name] 03/11/2012

## 2012-03-11 NOTE — Progress Notes (Signed)
Doing well and anticipate dc tomorrow

## 2012-03-11 NOTE — Plan of Care (Signed)
Problem: Food- and Nutrition-Related Knowledge Deficit (NB-1.1) Goal: Nutrition education Formal process to instruct or train a patient/client in a skill or to impart knowledge to help patients/clients voluntarily manage or modify food choices and eating behavior to maintain or improve health.  Outcome: Completed/Met Date Met:  03/11/12 RD consult for Low Fiber diet education. Patient sleepy and non-verbal upon visitation; nodded "yes" or "no" to diet recall questions. Reviewed guidelines and foods not recommended. Handouts provided from [employer]; left on patient's tray table. Noted antcipated discharge tomorrow.   Alger Memos, RD, LDN Pager #: [phone]

## 2012-03-11 NOTE — Progress Notes (Signed)
Physical Therapy Treatment Patient Details Name: Vanessa [name] MRN: [id number] DOB: [birth year] Today's Date: 03/11/2012 Time: 4098-1191 PT Time Calculation (min): 15 min  PT Assessment / Plan / Recommendation Comments on Treatment Session  Pt still fatigues very quickly but continues to progress and very motivated by possibility of going home tomorrow. Because she become easily fatigued and the distance she will be required to ambulate to get into her apartment I will recommend the RW for energy conservation and safety.     Follow Up Recommendations  Home health PT;Supervision for mobility/OOB    Barriers to Discharge        Equipment Recommendations  Rolling walker with 5" wheels    Recommendations for Other Services    Frequency Min 3X/week (+)   Plan Discharge plan remains appropriate;Frequency remains appropriate    Precautions / Restrictions Precautions Precautions: Fall       Mobility  Bed Mobility Supine to Sit: 6: Modified independent (Device/Increase time) Transfers Sit to Stand: 5: Supervision;With upper extremity assist;From bed Stand to Sit: 5: Supervision;To bed;With upper extremity assist Details for Transfer Assistance: cues for technique and safety Ambulation/Gait Ambulation/Gait Assistance: 4: Min guard Ambulation Distance (Feet): 250 Feet Assistive device: None Ambulation/Gait Assistance Details: slow staggered gait, very narrow BOS tending to throw her laterally but able to correct; encouraged pt to improve posture and stride length for increased speed Gait Pattern: Trunk flexed;Shuffle;Narrow base of support;Decreased stride length Stairs: Yes Stairs Assistance: 4: Min assist Stairs Assistance Details (indicate cue type and reason): facilitation for stability Stair Management Technique: Two rails;Step to pattern Number of Stairs: 3     Exercises General Exercises - Lower Extremity Hip ABduction/ADduction: AROM;Both;5 reps;Standing (supported by  bilateral upper extremities as well as SBA ) Hip Flexion/Marching: AROM;Both;10 reps;Standing (SBA and supported bilateral upper extremities)    PT Goals Acute Rehab PT Goals PT Goal: Supine/Side to Sit - Progress: Met PT Goal: Sit to Stand - Progress: Progressing toward goal PT Goal: Stand to Sit - Progress: Progressing toward goal PT Goal: Ambulate - Progress: Progressing toward goal PT Goal: Up/Down Stairs - Progress: Progressing toward goal  Visit Information  Last PT Received On: 03/11/12 Assistance Needed: +1    Subjective Data  Subjective: I get to go home tomorrow!   Cognition  Overall Cognitive Status: Appears within functional limits for tasks assessed/performed Arousal/Alertness: Awake/alert Orientation Level: Appears intact for tasks assessed Behavior During Session: [facility] for tasks performed    Balance  Balance Balance Assessed: Yes Dynamic Standing Balance Dynamic Standing - Balance Support: No upper extremity supported Dynamic Standing - Level of Assistance: 5: Stand by assistance Dynamic Standing - Comments: stood for approx 2-3 minutes speaking to PA in the hallway (able to perform trunk rotation and weight shift with no LOB)   End of Session PT - End of Session Equipment Utilized During Treatment: Gait belt Activity Tolerance: Patient tolerated treatment well;Patient limited by fatigue Patient left: in bed;with call bell/phone within reach    [facility] HELEN 03/11/2012, 1:36 PM

## 2012-03-12 ENCOUNTER — Telehealth (INDEPENDENT_AMBULATORY_CARE_PROVIDER_SITE_OTHER): Payer: Self-pay

## 2012-03-12 ENCOUNTER — Emergency Department (HOSPITAL_COMMUNITY)
Admission: EM | Admit: 2012-03-12 | Discharge: 2012-03-13 | Discharge status: Against Medical Advice | Payer: Medicaid Other | Attending: Emergency Medicine | Admitting: Emergency Medicine

## 2012-03-12 ENCOUNTER — Telehealth (INDEPENDENT_AMBULATORY_CARE_PROVIDER_SITE_OTHER): Payer: Self-pay | Admitting: General Surgery

## 2012-03-12 DIAGNOSIS — M311 Thrombotic microangiopathy: Secondary | ICD-10-CM

## 2012-03-12 DIAGNOSIS — Z0389 Encounter for observation for other suspected diseases and conditions ruled out: Secondary | ICD-10-CM | POA: Insufficient documentation

## 2012-03-12 DIAGNOSIS — K631 Perforation of intestine (nontraumatic): Secondary | ICD-10-CM

## 2012-03-12 DIAGNOSIS — K625 Hemorrhage of anus and rectum: Secondary | ICD-10-CM

## 2012-03-12 DIAGNOSIS — K651 Peritoneal abscess: Secondary | ICD-10-CM

## 2012-03-12 LAB — COMPREHENSIVE METABOLIC PANEL
Albumin: 2.2 g/dL — ABNORMAL LOW (ref 3.5–5.2)
Alkaline Phosphatase: 73 U/L (ref 39–117)
BUN: 5 mg/dL — ABNORMAL LOW (ref 6–23)
Chloride: 101 mEq/L (ref 96–112)
GFR calc Af Amer: >90 mL/min (ref >90)
Glucose, Bld: 103 mg/dL — ABNORMAL HIGH (ref 70–99)
Potassium: 4.1 mEq/L (ref 3.5–5.1)
Total Bilirubin: 0.2 mg/dL — ABNORMAL LOW (ref 0.3–1.2)

## 2012-03-12 LAB — MAGNESIUM: Magnesium: 1.8 mg/dL (ref 1.5–2.5)

## 2012-03-12 MED ORDER — PROCHLORPERAZINE MALEATE 5 MG PO TABS: 5.0000 mg | ORAL_TABLET | Freq: Three times a day (TID) | ORAL | Status: DC

## 2012-03-12 MED ORDER — FERROUS FUMARATE 325 (106 FE) MG PO TABS: 1.0000 | ORAL_TABLET | Freq: Every day | ORAL | Status: DC

## 2012-03-12 MED ORDER — PANTOPRAZOLE SODIUM 40 MG PO TBEC: 40.0000 mg | DELAYED_RELEASE_TABLET | Freq: Every day | ORAL | Status: DC

## 2012-03-12 MED ORDER — OXYCODONE-ACETAMINOPHEN 5-325 MG PO TABS: 1.0000 | ORAL_TABLET | ORAL | Status: DC | PRN

## 2012-03-12 MED ORDER — METOCLOPRAMIDE HCL 10 MG PO TABS: 10.0000 mg | ORAL_TABLET | Freq: Three times a day (TID) | ORAL | Status: DC

## 2012-03-12 MED ORDER — OXYCODONE HCL 20 MG PO TB12: 20.0000 mg | ORAL_TABLET | Freq: Two times a day (BID) | ORAL | Status: DC

## 2012-03-12 MED ORDER — ZOLPIDEM TARTRATE 10 MG PO TABS: 10.0000 mg | ORAL_TABLET | Freq: Every evening | ORAL | Status: DC | PRN

## 2012-03-12 NOTE — Progress Notes (Signed)
Per MD order, PICC line removed. Cath intact at 38cm. Vaseline pressure gauze to site, pressure held x . No bleeding to site. Pt instructed to keep dressing CDI x 24 hours. Avoid heavy lifting, pushing or pulling x 24 hours,  If bleeding occurs hold pressure, if bleeding does not stop contact MD or go to the ED. Teach back done successfully. Pt does not have any questions. Consuello Masse

## 2012-03-12 NOTE — Progress Notes (Signed)
Discharge teaching completed with patient and family, including abdominal dressing change, discharge meds and signs and symptoms of infection.  Verbalizes understanding and no further questions.  Discharged with family in wheelchair in stable condition.

## 2012-03-12 NOTE — Telephone Encounter (Signed)
LORETTA CALLED RE F/U APPT WITH DR. Trudie Buckler DISCHARGE/SEE DR. [name] IN 7 DAYS// MICHELLE MADE AN APPT FOR PT ON 03-18-12 AT 2:15PM/ I LEFT MESSAGE AT CALL BACK # [phone]/GY

## 2012-03-12 NOTE — Discharge Summary (Signed)
She is anxious to go and has been progressing this week. Abd benign, persistant drainage, stable in amount and consistency

## 2012-03-12 NOTE — Progress Notes (Signed)
Physical Therapy Treatment Patient Details Name: ALESHKA CORNEY MRN: [id number] DOB: [birth year] Today's Date: 03/12/2012 Time: 6962-9528 PT Time Calculation (min): 16 min  PT Assessment / Plan / Recommendation Comments on Treatment Session  Patient very excited about going home today. Safer with the RW and reports feeling confident with it. Still nervous on the stairs. Educated pt to have someone with her when going in her home.     Follow Up Recommendations  Home health PT;Supervision for mobility/OOB    Barriers to Discharge        Equipment Recommendations  Rolling walker with 5" wheels    Recommendations for Other Services    Frequency Min 3X/week   Plan Discharge plan remains appropriate;Frequency remains appropriate    Precautions / Restrictions Precautions Precautions: Fall       Mobility  Bed Mobility Supine to Sit: 6: Modified independent (Device/Increase time);HOB flat Details for Bed Mobility Assistance: pulling on therapist to bring herself upright Transfers Transfers: Sit to Stand;Stand to Sit Sit to Stand: 5: Supervision;With upper extremity assist;From bed Stand to Sit: 5: Supervision;With upper extremity assist;To chair/3-in-1;With armrests Details for Transfer Assistance: cues for safe hand placement with regards to RW Ambulation/Gait Ambulation/Gait Assistance: 5: Supervision Ambulation Distance (Feet): 200 Feet Assistive device: Rolling walker Ambulation/Gait Assistance Details: slow gait, more stable with the RW, cues for upright posture and safe technique with RW especially during turning Stairs Assistance: 4: Min guard Stairs Assistance Details (indicate cue type and reason): cues for safe technique, pt very nervous on the stairs so mingaurdA Stair Management Technique: Two rails;Step to pattern Number of Stairs: 3     Exercises      PT Goals Acute Rehab PT Goals PT Goal: Supine/Side to Sit - Progress: Met PT Goal: Sit to Stand - Progress:  Progressing toward goal PT Goal: Stand to Sit - Progress: Progressing toward goal PT Goal: Ambulate - Progress: Progressing toward goal PT Goal: Up/Down Stairs - Progress: Progressing toward goal  Visit Information  Last PT Received On: 03/12/12 Assistance Needed: +1    Subjective Data  Subjective: They are filling out my paperwork now.    Cognition  Overall Cognitive Status: Appears within functional limits for tasks assessed/performed Arousal/Alertness: Awake/alert Orientation Level: Appears intact for tasks assessed Behavior During Session: [facility] for tasks performed    Balance     End of Session PT - End of Session Equipment Utilized During Treatment: Gait belt Activity Tolerance: Patient tolerated treatment well Patient left: in chair;with call bell/phone within reach Nurse Communication: Mobility status    WHITLOW,Kasiyah Platter HELEN 03/12/2012, 8:54 AM

## 2012-03-12 NOTE — Telephone Encounter (Signed)
[facility] ED notified to page [facility] On-Call General Surgeon when patient arrives to ED.

## 2012-03-12 NOTE — Discharge Summary (Signed)
Physician Discharge Summary  Patient ID: Vanessa [name] MRN: [id number] DOB/AGE: 36-Apr-1977 36 y.o.  Admit date: 01/27/2012 Discharge date: 03/12/2012  Discharge Diagnoses Patient Active Problem List   Diagnosis Date Noted  . S/P left/sigmoid colectomy with anastomotic leak- ileostomy 03/05/2012  . Ileus following gastrointestinal surgery 03/05/2012  . On total parenteral nutrition 03/05/2012  . Abscess, peritoneal 03/05/2012  . Hemorrhage of rectum and anus 02/20/2012  . Acute posthemorrhagic anemia 02/20/2012  . Thrombocytopenia due to medication 02/20/2012  . 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 Pyelonephritis 01/27/2012  . Nausea & vomiting 01/27/2012  . Hypercalcemia 01/27/2012  . Cervical cancer 01/07/2012  . Post-radiation rectal bleeding 01/07/2012    Consultants GI IM Wound care Hematology Critical Care Infectious Disease ENT Radiology  Procedures COLON RESECTION SIGMOID/DIVERTING LOOP ILEOSTOMY 02/04/12   HPI: We were asked to see patient at the request of Dr Rhea Belton due to a sigmoid colon stricture found on colonoscopy, CT and BE at 20 cm. Pt had a history of cervical cancer and radiation in 2011. Many week history of nausea and vomiting.  High grade stricture noted. No fever or chills.Colonoscopy shows mild proctitis. BE showed 95 % narrowing at 20 cm from anal verge.    Hospital Course: Patient was admitted through the ED with several week history of nausea and vomiting.  GI was consulted and a colonoscopy was performed which showed the findings above.  Given this surgery was consulted and we recommended sigmoid colon resection and diverting loop ileostomy which was performed on 02/04/12.  The patients post-operative course was very complicated including drug induced thrombocytopenia, abdominal abscesses secondary to anastomotic breakdown requiring CT guided drain aspiration and placement times 3, severe pain, wound care issues.   Multiple consultants were called in for assistance with the patient's care given her complicated course.  For her thrombocytopenia, all antibiotics and platelet insulting drugs were d/c'd and this resolved.  Her abscesses were drained and all but one drain were d/c'd before discharge.  There was still purulent drainage from one drain and this was left in place.  At the time of d/c her vitals were stable, she was afebrile, she was walking well, tolerating a regular diet, her ostomy was functioning well, her wound was healing well and home health was set up for the patient to go home.    Medication List  As of 03/12/2012  8:05 AM   STOP taking these medications         cephALEXin 500 MG capsule      morphine 15 MG 12 hr tablet         TAKE these medications         ferrous fumarate 325 (106 FE) MG Tabs   Commonly known as: HEMOCYTE - 106 mg FE   Take 1 tablet (106 mg of iron total) by mouth daily.      hydrochlorothiazide 12.5 MG tablet   Commonly known as: HYDRODIURIL   Take 12.5 mg by mouth daily.      ibuprofen 800 MG tablet   Commonly known as: ADVIL,MOTRIN   Take 800 mg by mouth 3 (three) times daily.      metoCLOPramide 10 MG tablet   Commonly known as: REGLAN   Take 1 tablet (10 mg total) by mouth 3 (three) times daily before meals.      ondansetron 4 MG tablet   Commonly known as: ZOFRAN   Take 4 mg by mouth every 6 (six) hours as  needed. For nausea.      oxyCODONE 20 MG 12 hr tablet   Commonly known as: OXYCONTIN   Take 1 tablet (20 mg total) by mouth 2 (two) times daily.      oxyCODONE-acetaminophen 5-325 MG per tablet   Commonly known as: PERCOCET   Take 1-2 tablets by mouth every 6 (six) hours as needed. For pain.      oxyCODONE-acetaminophen 5-325 MG per tablet   Commonly known as: PERCOCET   Take 1-2 tablets by mouth every 4 (four) hours as needed (pain).      pantoprazole 40 MG tablet   Commonly known as: PROTONIX   Take 1 tablet (40 mg total) by mouth  daily at 12 noon.      prochlorperazine 5 MG tablet   Commonly known as: COMPAZINE   Take 1 tablet (5 mg total) by mouth 4 (four) times daily -  with meals and at bedtime.      zolpidem 10 MG tablet   Commonly known as: AMBIEN   Take 1 tablet (10 mg total) by mouth at bedtime as needed for sleep.             Follow-up Information    Follow up with Oneita Hurt, MD. Schedule an appointment as soon as possible for a visit today. (Please make an appointment to follow up with Dr. Kathrynn Running 2 - 4 weeks after discharge.)    Contact information:   [address] [place] [address] [phone]       Follow up with Dorrene German, MD. Schedule an appointment as soon as possible for a visit in 2 weeks.   Contact information:   [address] [place] [address] [phone]       Follow up with Cherylynn Ridges, MD. Schedule an appointment as soon as possible for a visit in 7 days. [facility] follow up)    Contact information:   [address] [facility], [place] [address] [phone]          Signed: Clance Boll, PA-C  03/12/2012, 8:05 AM

## 2012-03-12 NOTE — Telephone Encounter (Signed)
Patient calling to report continuous vomiting since being D/C today.  Patient states "food she ate 2 day's ago and thick green bile" due to patient complex medical history patient was instructed to go back to [facility] ED.  Paged [place], [place] @ [facility]

## 2012-03-12 NOTE — Telephone Encounter (Signed)
LMOM  At home and cell for pt to call me back. I have been in touch with Pre authorization and so has Hurshel Keys. Trying to get the Oxycontin authorized. I completed the form from [facility] and faxed to [phone]. I notified Neysa Bonito and Marcelino Duster what is going on with the pt. Marisue Ivan wants the pt to know that she can take Oxycodone 3tabs q 6hrs for the next 24 hrs only b/c she really shouldn't continue that b/c of the Tylenol after the 24hrs.

## 2012-03-12 NOTE — Progress Notes (Signed)
Subjective: Vein well  Objective: Vital signs in last 24 hours: Filed Vitals:   03/10/12 2144 03/11/12 1354 03/11/12 2119 03/12/12 0541  BP: 128/88 122/89 137/98 129/81  Pulse: 84 93 78 92  Temp: 98.7 F (37.1 C) 98.7 F (37.1 C) 98.8 F (37.1 C) 99.1 F (37.3 C)  TempSrc: Oral  Oral Oral  Resp: 18 16 18 18   Height:      Weight:      SpO2: 100% 100% 100% 100%    Intake/Output Summary (Last 24 hours) at 03/12/12 1215 Last data filed at 03/12/12 0844  Gross per 24 hour  Intake   2755 ml  Output    760 ml  Net   1995 ml    Weight change:    alert and oriented x3  Heart regular rate and rhythm without murmurs rubs gallops  Lab Results: Results for orders placed during the hospital encounter of 01/27/12 (from the past 24 hour(s))  COMPREHENSIVE METABOLIC PANEL     Status: Abnormal   Collection Time   03/12/12  6:39 AM      Component Value Range   Sodium 133 (*) 135 - 145 mEq/L   Potassium 4.1  3.5 - 5.1 mEq/L   Chloride 101  96 - 112 mEq/L   CO2 26  19 - 32 mEq/L   Glucose, Bld 103 (*) 70 - 99 mg/dL   BUN 5 (*) 6 - 23 mg/dL   Creatinine, Ser 1.61 (*) 0.50 - 1.10 mg/dL   Calcium 09.6  8.4 - 04.5 mg/dL   Total Protein 7.0  6.0 - 8.3 g/dL   Albumin 2.2 (*) 3.5 - 5.2 g/dL   AST 12  0 - 37 U/L   ALT 5  0 - 35 U/L   Alkaline Phosphatase 73  39 - 117 U/L   Total Bilirubin 0.2 (*) 0.3 - 1.2 mg/dL   GFR calc non Af Amer >90  >90 mL/min   GFR calc Af Amer >90  >90 mL/min  MAGNESIUM     Status: Normal   Collection Time   03/12/12  6:39 AM      Component Value Range   Magnesium 1.8  1.5 - 2.5 mg/dL  PHOSPHORUS     Status: Normal   Collection Time   03/12/12  6:39 AM      Component Value Range   Phosphorus 3.3  2.3 - 4.6 mg/dL     Micro: No results found for this or any previous visit (from the past 240 hour(s)).  Studies/Results: No results found.  Medications:  Scheduled Meds:   . magnesium sulfate 1 - 4 g bolus IVPB  4 g Intravenous Once  . potassium  chloride  10 mEq Intravenous Q1 Hr x 5  . DISCONTD: antiseptic oral rinse  15 mL Mouth Rinse QID  . DISCONTD: barrier cream  1 application Topical TID  . DISCONTD: chlorhexidine  15 mL Mouth/Throat BID  . DISCONTD: ciprofloxacin  400 mg Intravenous Q12H  . DISCONTD: feeding supplement  1 Container Oral TID BM  . DISCONTD: ferrous fumarate  1 tablet Oral Daily  . DISCONTD: folic acid  1 mg Oral Daily  . DISCONTD: gatorade (BH)  240 mL Oral TID  . DISCONTD: lidocaine   Topical QID  . DISCONTD: metoCLOPramide  10 mg Oral TID AC  . DISCONTD: metronidazole  500 mg Intravenous Q8H  . DISCONTD: oxyCODONE  20 mg Oral BID  . DISCONTD: pantoprazole  40 mg Oral Q1200  .  DISCONTD: prochlorperazine  5 mg Oral TID WC & HS  . DISCONTD: sodium chloride  10-40 mL Intracatheter Q12H   Continuous Infusions:   . TPN (CLINIMIX) +/- additives 50 mL/hr at 03/10/12 1757  . DISCONTD: sodium chloride 20 mL/hr at 03/07/12 1237  . DISCONTD: fat emulsion 250 mL (03/11/12 1749)  . DISCONTD: TPN (CLINIMIX) +/- additives 50 mL/hr at 03/11/12 1749   PRN Meds:.DISCONTD: sodium chloride, DISCONTD: acetaminophen, DISCONTD: diphenhydrAMINE, DISCONTD: hydrALAZINE, DISCONTD:  HYDROmorphone (DILAUDID) injection, DISCONTD: LORazepam, DISCONTD: ondansetron (ZOFRAN) IV, DISCONTD: ondansetron, DISCONTD: oxyCODONE-acetaminophen, DISCONTD: promethazine, DISCONTD: sodium chloride, DISCONTD: zolpidem   Assessment: Principal Problem:  *Sigmoid stricture Active Problems:  Cervical cancer  Post-radiation rectal bleeding  Pyelonephritis  Nausea & vomiting  Hypercalcemia  Radiation colitis  Hemorrhage of rectum and anus  Acute posthemorrhagic anemia  Thrombocytopenia due to medication  S/P left/sigmoid colectomy with anastomotic leak- ileostomy  Ileus following gastrointestinal surgery  On total parenteral nutrition  Abscess, peritoneal   Plan: #1 Sigmoid stricture status post sigmoid colectomy and ileostomy    Interventional radiology and general surgery following as far as the JP drain is concerned , to be discharged today by surgery #2 thrombocytopenia improved  #3 ileus still on TPN, however is on a regular diet and tolerating it well , plan is to discontinue TPN Will differ management of TPN to general surgery  #4 postradiation rectal bleeding resolved , hemoglobin stable #5 anemia hemoglobin now stable at 8.1, for documentation the patient has received 27 units of packed red blood cells this admission , CBC in one week #6 disposition per surgery    LOS: 45 days   [facility] 03/12/2012, 12:15 PM

## 2012-03-12 NOTE — ED Notes (Signed)
Called admitting,  No request has been made.  Patient then left the triage area.  She did not talk to staff before leaving

## 2012-03-12 NOTE — Plan of Care (Signed)
Problem: Discharge Progression Outcomes Goal: Tubes and drains discontinued if indicated Outcome: Adequate for Discharge Discharge teaching complete on drain care and dressing stage.

## 2012-03-13 ENCOUNTER — Telehealth (INDEPENDENT_AMBULATORY_CARE_PROVIDER_SITE_OTHER): Payer: Self-pay | Admitting: General Surgery

## 2012-03-13 NOTE — Telephone Encounter (Signed)
Report from home health nurse on pt's condition today.  Pt reported to her that she had gone to the ER last evening, but left without being seen because she felt fine after she finished vomiting.  She has not vomited since then at all.  The drain was emptied for 75-100 ml of purulent fluid.  Her aunt has been taught to do the BID dressing changes.  Nurse said she looks good today.

## 2012-03-16 ENCOUNTER — Emergency Department (HOSPITAL_COMMUNITY): Payer: Medicaid Other

## 2012-03-16 ENCOUNTER — Encounter (HOSPITAL_COMMUNITY): Payer: Self-pay | Admitting: Emergency Medicine

## 2012-03-16 ENCOUNTER — Inpatient Hospital Stay (HOSPITAL_COMMUNITY)
Admission: EM | Admit: 2012-03-16 | Discharge: 2012-03-19 | DRG: 390 | Disposition: A | Discharge status: Home / Self Care | Payer: Medicaid Other | Attending: Surgery | Admitting: Surgery

## 2012-03-16 DIAGNOSIS — D72829 Elevated white blood cell count, unspecified: Secondary | ICD-10-CM | POA: Diagnosis present

## 2012-03-16 DIAGNOSIS — Z932 Ileostomy status: Secondary | ICD-10-CM

## 2012-03-16 DIAGNOSIS — E876 Hypokalemia: Secondary | ICD-10-CM | POA: Diagnosis present

## 2012-03-16 DIAGNOSIS — K56609 Unspecified intestinal obstruction, unspecified as to partial versus complete obstruction: Secondary | ICD-10-CM

## 2012-03-16 DIAGNOSIS — Z9049 Acquired absence of other specified parts of digestive tract: Secondary | ICD-10-CM

## 2012-03-16 DIAGNOSIS — Z8541 Personal history of malignant neoplasm of cervix uteri: Secondary | ICD-10-CM

## 2012-03-16 HISTORY — DX: Personal history of other medical treatment: Z92.89

## 2012-03-16 LAB — POCT I-STAT, CHEM 8
Calcium, Ion: 1.41 mmol/L — ABNORMAL HIGH (ref 1.12–1.32)
Chloride: 98 mEq/L (ref 96–112)
HCT: 40 % (ref 36.0–46.0)
Hemoglobin: 13.6 g/dL (ref 12.0–15.0)

## 2012-03-16 LAB — POCT PREGNANCY, URINE: Preg Test, Ur: NEGATIVE

## 2012-03-16 LAB — DIFFERENTIAL
Lymphs Abs: 1.8 10*3/uL (ref 0.7–4.0)
Monocytes Relative: 8 % (ref 3–12)
Neutro Abs: 11.2 10*3/uL — ABNORMAL HIGH (ref 1.7–7.7)
Neutrophils Relative %: 78 % — ABNORMAL HIGH (ref 43–77)

## 2012-03-16 LAB — URINALYSIS, ROUTINE W REFLEX MICROSCOPIC
Glucose, UA: NEGATIVE mg/dL
Hgb urine dipstick: NEGATIVE
Specific Gravity, Urine: 1.034 — ABNORMAL HIGH (ref 1.005–1.030)
Urobilinogen, UA: 0.2 mg/dL (ref 0.0–1.0)

## 2012-03-16 LAB — URINE MICROSCOPIC-ADD ON

## 2012-03-16 LAB — LACTIC ACID, PLASMA: Lactic Acid, Venous: 1.5 mmol/L (ref 0.5–2.2)

## 2012-03-16 LAB — CBC
Hemoglobin: 11.5 g/dL — ABNORMAL LOW (ref 12.0–15.0)
RBC: 3.56 MIL/uL — ABNORMAL LOW (ref 3.87–5.11)

## 2012-03-16 LAB — LIPASE, BLOOD: Lipase: <3 U/L — ABNORMAL LOW (ref 11–59)

## 2012-03-16 MED ORDER — SIMETHICONE 40 MG/0.6ML PO SUSP
40.0000 mg | Freq: Four times a day (QID) | ORAL | Status: DC
  Administered 2012-03-16 – 2012-03-19 (×12): 40 mg via ORAL
  Filled 2012-03-16 (×18): qty 0.6

## 2012-03-16 MED ORDER — IOHEXOL 300 MG/ML  SOLN
80.0000 mL | Freq: Once | INTRAMUSCULAR | Status: AC | PRN
  Administered 2012-03-16: 80 mL via INTRAVENOUS

## 2012-03-16 MED ORDER — WHITE PETROLATUM GEL
Status: AC
  Administered 2012-03-16: 21:00:00
  Filled 2012-03-16: qty 5

## 2012-03-16 MED ORDER — ONDANSETRON HCL 4 MG/2ML IJ SOLN
4.0000 mg | Freq: Once | INTRAMUSCULAR | Status: AC
  Administered 2012-03-16: 4 mg via INTRAVENOUS
  Filled 2012-03-16: qty 2

## 2012-03-16 MED ORDER — KCL IN DEXTROSE-NACL 20-5-0.45 MEQ/L-%-% IV SOLN: INTRAVENOUS | Status: DC

## 2012-03-16 MED ORDER — ONDANSETRON HCL 4 MG/2ML IJ SOLN
4.0000 mg | Freq: Four times a day (QID) | INTRAMUSCULAR | Status: DC | PRN
  Administered 2012-03-17 – 2012-03-18 (×2): 4 mg via INTRAVENOUS
  Filled 2012-03-16 (×2): qty 2

## 2012-03-16 MED ORDER — HYDROMORPHONE HCL PF 1 MG/ML IJ SOLN
1.0000 mg | INTRAMUSCULAR | Status: DC | PRN
  Administered 2012-03-16: 1 mg via INTRAVENOUS
  Administered 2012-03-16: 2 mg via INTRAVENOUS
  Administered 2012-03-16 – 2012-03-17 (×5): 1 mg via INTRAVENOUS
  Administered 2012-03-17 (×4): 2 mg via INTRAVENOUS
  Administered 2012-03-17: 1 mg via INTRAVENOUS
  Administered 2012-03-17 – 2012-03-18 (×3): 2 mg via INTRAVENOUS
  Administered 2012-03-18 (×3): 1 mg via INTRAVENOUS
  Administered 2012-03-18 (×2): 2 mg via INTRAVENOUS
  Administered 2012-03-18 – 2012-03-19 (×2): 1 mg via INTRAVENOUS
  Filled 2012-03-16: qty 1
  Filled 2012-03-16 (×2): qty 2
  Filled 2012-03-16 (×5): qty 1
  Filled 2012-03-16 (×2): qty 2
  Filled 2012-03-16: qty 1
  Filled 2012-03-16: qty 2
  Filled 2012-03-16 (×2): qty 1
  Filled 2012-03-16: qty 2
  Filled 2012-03-16: qty 1
  Filled 2012-03-16 (×5): qty 2
  Filled 2012-03-16: qty 1

## 2012-03-16 MED ORDER — HYDROMORPHONE HCL PF 1 MG/ML IJ SOLN
1.0000 mg | Freq: Once | INTRAMUSCULAR | Status: AC
  Administered 2012-03-16: 1 mg via INTRAVENOUS
  Filled 2012-03-16: qty 1

## 2012-03-16 MED ORDER — IOHEXOL 300 MG/ML  SOLN
20.0000 mL | INTRAMUSCULAR | Status: DC
  Administered 2012-03-16: 20 mL via ORAL

## 2012-03-16 MED ORDER — PANTOPRAZOLE SODIUM 40 MG IV SOLR
40.0000 mg | Freq: Every day | INTRAVENOUS | Status: DC
  Administered 2012-03-16 – 2012-03-18 (×3): 40 mg via INTRAVENOUS
  Filled 2012-03-16 (×6): qty 40

## 2012-03-16 MED ORDER — KCL IN DEXTROSE-NACL 20-5-0.45 MEQ/L-%-% IV SOLN
INTRAVENOUS | Status: DC
  Administered 2012-03-16 – 2012-03-19 (×8): via INTRAVENOUS
  Filled 2012-03-16 (×12): qty 1000

## 2012-03-16 MED ORDER — SODIUM CHLORIDE 0.9 % IV BOLUS (SEPSIS)
1000.0000 mL | Freq: Once | INTRAVENOUS | Status: AC
  Administered 2012-03-16: 1000 mL via INTRAVENOUS

## 2012-03-16 NOTE — ED Notes (Signed)
Let pt know we needed urine. Can not go at this time

## 2012-03-16 NOTE — Progress Notes (Signed)
Subjective: Feels better, but still with c/o feeling full of "gas", no NV reported.  Objective: Vital signs in last 24 hours: Temp:  [97.7 F (36.5 C)-98.4 F (36.9 C)] 98.2 F (36.8 C) (06/24 0900) Pulse Rate:  [101-118] 101  (06/24 0900) Resp:  [16-24] 17  (06/24 0900) BP: (113-129)/(79-89) 118/79 mmHg (06/24 0900) SpO2:  [99 %-100 %] 100 % (06/24 0900)    Intake/Output from previous day:   Intake/Output this shift:    General appearance: alert, cooperative and no distress Abdomen: soft, tympanic, hypoactive BS, illeostomy draining dark liquid. Abdominal drain draining tanish, thin liquid; consistent with previously noted drainage on discharge.  Lab Results:   Basename 03/16/12 0222 03/16/12 0153  WBC -- 14.4*  HGB 13.6 11.5*  HCT 40.0 34.6*  PLT -- 550*   BMET  Basename 03/16/12 0222  NA 135  K 3.4*  CL 98  CO2 --  GLUCOSE 92  BUN 4*  CREATININE 0.50  CALCIUM --   PT/INR No results found for this basename: LABPROT:2,INR:2 in the last 72 hours ABG No results found for this basename: PHART:2,PCO2:2,PO2:2,HCO3:2 in the last 72 hours  Studies/Results: Ct Abdomen Pelvis W Contrast  03/16/2012  *RADIOLOGY REPORT*  Clinical Data: Nausea, vomiting, and abdominal pain.  CT ABDOMEN AND PELVIS WITH CONTRAST  Technique:  Multidetector CT imaging of the abdomen and pelvis was performed following the standard protocol during bolus administration of intravenous contrast.  Contrast: 80mL OMNIPAQUE IOHEXOL 300 MG/ML  SOLN  Comparison: 02/27/2012  Findings: Atelectasis in the lung bases, improved since the previous study.  Focal subcapsular fluid collection along the right anterolateral liver edge is decreasing in size since the previous study, now measuring about 18 mm maximal diameter.  Left subdiaphragmatic and subcapsular splenic collections have also decreased in size.  Surgical absence of the gallbladder.  Pancreas, adrenal glands, kidneys, and abdominal aorta are  unremarkable. Infiltration or edema in the mesentery.  Small amount of free fluid in Morison's pouch on the right with mild fluid in the pericolic gutters bilaterally.  Tiny free air collections are demonstrated in the abdomen, likely remaining from previous surgery and catheter insertions.  Small umbilical hernia with fluid and air.  Skin clips have been removed.  Mildly dilated small bowel without obvious wall thickening.  Changes could represent early obstruction versus ileus.  Stool in the right colon with decompression of the distal colon.  Colonic wall thickening cannot be excluded due to decompression.  Right lower quadrant ileostomy.  Surgical clips in the transverse colon region.  Pelvis:  Small amount of free fluid in the pelvis.  Interval development of bladder wall thickening and bladder gas which could represent fistula versus bladder infection.  Left lower quadrant pelvic drainage catheter remains unchanged in position.  The previously demonstrated abscess cavity is significantly decreased in size.  IMPRESSION: Left lower quadrant pelvic drainage catheter remains in place. There is significant decrease in size of the left pelvic, hepatic subcapsular, and splenic subcapsular/subdiaphragmatic abscess is since the previous study.  Small bowel are mildly dilated which could be due to early obstruction or ileus.  Follow-up recommended depending on the clinical course.  Interval development of gas in the bladder which could represent fistula versus infection.  Small amount of free fluid in the pelvis with mesenteric infiltration and scattered fluid along the pericolic gutters bilaterally.  Original Report Authenticated By: Marlon Pel, M.D.   Dg Abd Acute W/chest  03/16/2012  *RADIOLOGY REPORT*  Clinical Data: Left lower quadrant  pain for 3 hours.  Recent history of bowel obstruction with colostomy surgery in May 2013.  ACUTE ABDOMEN SERIES (ABDOMEN 2 VIEW & CHEST 1 VIEW)  Comparison: 02/02/2012   Findings: Normal heart size and pulmonary vascularity.  No focal airspace consolidation in the lungs.  Mild linear atelectasis in the lung bases.  No pneumothorax.  No blunting of costophrenic angles.  Mild gaseous distension of small bowel with multiple air-fluid levels suggesting early mechanical obstruction.  No free intra- abdominal air.  Postsurgical changes scattered throughout the abdomen with drainage catheter in the left lower quadrant.  Stool in the right colon.  IMPRESSION: No evidence of active pulmonary disease.  Mild small bowel distension with multiple air-fluid levels suggesting early obstruction.  Original Report Authenticated By: Marlon Pel, M.D.    Anti-infectives: Anti-infectives    None      Assessment/Plan: s/p * No surgery found * 1. NPO status 2. IVF 3. Await ID's input for antibiotic therapy. 4. Will recheck K+ in am.  LOS: 0 days    Vanessa [name] 03/16/2012

## 2012-03-16 NOTE — ED Notes (Signed)
Pt being transferred to [id number].

## 2012-03-16 NOTE — Progress Notes (Signed)
Distended; thick ileostomy output - minimal gas  Minimal bowel sounds  PSBO - bowel rest; NPO  Wilmon Arms. Corliss Skains, MD, [facility]  03/16/2012 3:11 PM

## 2012-03-16 NOTE — ED Notes (Signed)
Pt reports being nauseated all day and had onset of pain @ 1 hr ago large emesis on arrival @ 1800 ml food mucus mix

## 2012-03-16 NOTE — ED Provider Notes (Signed)
History     CSN: [id number]  Arrival date & time 03/16/12  0124   First MD Initiated Contact with Patient 03/16/12 0135      Chief Complaint  Patient presents with  . Abdominal Pain  . Nausea  . Emesis    (Consider location/radiation/quality/duration/timing/severity/associated sxs/prior treatment) Patient is a 36 y.o. female presenting with abdominal pain and vomiting. The history is provided by the patient. No language interpreter was used.  Abdominal Pain The primary symptoms of the illness include abdominal pain, nausea and vomiting. The primary symptoms of the illness do not include fever, fatigue, shortness of breath, diarrhea, hematemesis, hematochezia or dysuria. The current episode started yesterday. The onset of the illness was sudden. The problem has not changed since onset. The abdominal pain began yesterday. The pain came on suddenly. The abdominal pain has been unchanged since its onset. The abdominal pain is generalized. The abdominal pain does not radiate. The severity of the abdominal pain is 10/10. The abdominal pain is relieved by nothing. Exacerbated by: nothing.  The vomiting began today. Vomiting occurred once. The emesis contains stomach contents.  The illness is associated with a recent illness. The patient states that she believes she is currently not pregnant. Risk factors for an acute abdominal problem include a history of abdominal surgery. Symptoms associated with the illness do not include diaphoresis. Significant associated medical issues do not include cardiac disease.  Emesis  Associated symptoms include abdominal pain. Pertinent negatives include no diarrhea and no fever.    Past Medical History  Diagnosis Date  . Hypertension   . Cervical cancer     Past Surgical History  Procedure Date  . Cholecystectomy   . Radiation implants   . Colonoscopy 01/30/2012    Procedure: COLONOSCOPY;  Surgeon: Beverley Fiedler, MD;  Location: [facility] ENDOSCOPY;  Service:  Gastroenterology;  Laterality: N/A;  . Colonoscopy 01/30/2012    Procedure: COLONOSCOPY;  Surgeon: Beverley Fiedler, MD;  Location: [facility] OR;  Service: Gastroenterology;  Laterality: N/A;  . Colostomy revision 02/04/2012    Procedure: COLON RESECTION SIGMOID;  Surgeon: Cherylynn Ridges, MD;  Location: [facility] OR;  Service: General;  Laterality: N/A;    History reviewed. No pertinent family history.  History  Substance Use Topics  . Smoking status: Current Everyday Smoker -- 0.5 packs/day  . Smokeless tobacco: Not on file  . Alcohol Use: No    OB History    Grav Para Term Preterm Abortions TAB SAB Ect Mult Living   10 8              Review of Systems  Constitutional: Negative for fever, diaphoresis and fatigue.  Respiratory: Negative for shortness of breath.   Gastrointestinal: Positive for nausea, vomiting and abdominal pain. Negative for diarrhea, hematochezia and hematemesis.  Genitourinary: Negative for dysuria.  All other systems reviewed and are negative.    Allergies  Labetalol hcl and Morphine and related  Home Medications   Current Outpatient Rx  Name Route Sig Dispense Refill  . FERROUS FUMARATE 325 (106 FE) MG PO TABS Oral Take 1 tablet (106 mg of iron total) by mouth daily. 30 each 3  . HYDROCHLOROTHIAZIDE 12.5 MG PO TABS Oral Take 12.5 mg by mouth daily.    Marland Kitchen METOCLOPRAMIDE HCL 10 MG PO TABS Oral Take 1 tablet (10 mg total) by mouth 3 (three) times daily before meals. 90 tablet 1  . OXYCODONE-ACETAMINOPHEN 5-325 MG PO TABS Oral Take 1-2 tablets by mouth every 4 (four)  hours as needed (pain). 60 tablet 0  . PANTOPRAZOLE SODIUM 40 MG PO TBEC Oral Take 1 tablet (40 mg total) by mouth daily at 12 noon. 30 tablet 3  . PROCHLORPERAZINE MALEATE 5 MG PO TABS Oral Take 1 tablet (5 mg total) by mouth 4 (four) times daily -  with meals and at bedtime. 90 tablet 3  . ZOLPIDEM TARTRATE 10 MG PO TABS Oral Take 1 tablet (10 mg total) by mouth at bedtime as needed for sleep. 30 tablet 3  .  OXYCODONE HCL ER 20 MG PO TB12 Oral Take 1 tablet (20 mg total) by mouth 2 (two) times daily. 60 tablet 0    BP 120/88  Pulse 118  Temp 98.4 F (36.9 C) (Oral)  Resp 24  SpO2 100%  Physical Exam  Constitutional: She is oriented to person, place, and time. She appears well-developed and well-nourished.  HENT:  Head: Normocephalic and atraumatic.  Mouth/Throat: Oropharynx is clear and moist.  Eyes: Conjunctivae are normal. Pupils are equal, round, and reactive to light.  Neck: Normal range of motion. Neck supple.  Cardiovascular: Normal rate and regular rhythm.   Pulmonary/Chest: Effort normal and breath sounds normal. She has no wheezes. She has no rales.  Abdominal: She exhibits distension. Bowel sounds are absent. There is generalized tenderness. There is no rebound.    Musculoskeletal: Normal range of motion. She exhibits no edema.  Neurological: She is alert and oriented to person, place, and time.  Skin: Skin is warm and dry. She is not diaphoretic.  Psychiatric: She has a normal mood and affect.    ED Course  Procedures (including critical care time)  Labs Reviewed  CBC - Abnormal; Notable for the following:    WBC 14.4 (*)     RBC 3.56 (*)     Hemoglobin 11.5 (*)     HCT 34.6 (*)     RDW 20.3 (*)     Platelets 550 (*)     All other components within normal limits  DIFFERENTIAL - Abnormal; Notable for the following:    Neutrophils Relative 78 (*)     Neutro Abs 11.2 (*)     Monocytes Absolute 1.2 (*)     All other components within normal limits  LIPASE, BLOOD - Abnormal; Notable for the following:    Lipase <3 (*)     All other components within normal limits  POCT I-STAT, CHEM 8 - Abnormal; Notable for the following:    Potassium 3.4 (*)     BUN 4 (*)     Calcium, Ion 1.41 (*)     All other components within normal limits  LACTIC ACID, PLASMA  POCT PREGNANCY, URINE  URINALYSIS, ROUTINE W REFLEX MICROSCOPIC  CULTURE, BLOOD (ROUTINE X 2)  CULTURE, BLOOD  (ROUTINE X 2)   Dg Abd Acute W/chest  03/16/2012  *RADIOLOGY REPORT*  Clinical Data: Left lower quadrant pain for 3 hours.  Recent history of bowel obstruction with colostomy surgery in May 2013.  ACUTE ABDOMEN SERIES (ABDOMEN 2 VIEW & CHEST 1 VIEW)  Comparison: 02/02/2012  Findings: Normal heart size and pulmonary vascularity.  No focal airspace consolidation in the lungs.  Mild linear atelectasis in the lung bases.  No pneumothorax.  No blunting of costophrenic angles.  Mild gaseous distension of small bowel with multiple air-fluid levels suggesting early mechanical obstruction.  No free intra- abdominal air.  Postsurgical changes scattered throughout the abdomen with drainage catheter in the left lower quadrant.  Stool in  the right colon.  IMPRESSION: No evidence of active pulmonary disease.  Mild small bowel distension with multiple air-fluid levels suggesting early obstruction.  Original Report Authenticated By: Marlon Pel, M.D.     No diagnosis found.    MDM  Case d/w Dr. [name] of surgery.  No need for antibiotics at this time.  Will admit at Six am place in cdu        Vonte Rossin Smitty Cords, MD 03/16/12 [id number]

## 2012-03-16 NOTE — ED Notes (Signed)
Ready for transport, CT called

## 2012-03-16 NOTE — Progress Notes (Signed)
BRIEF READMIT NOTE Subjective: This is a very complicated 36 year old African American female who was just discharged from the hospital on June 20 after very prolonged hospitalization. She was in the hospital from May 6 through June 20. 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 She was taken to the operating room for sigmoid colectomy and diverting loop ileostomy. Her postoperative course was complicated by anastomotic breakdown and formation of multiple intra-abdominal abscesses. She had several percutaneous drains placed by interventional radiology. She also developed refractory thrombocytopenia possibly due to medication versus consumptive coagulopathy. Her thrombocytopenia eventually improved.  Since she was discharged, she has had some intermittent episodes of vomiting. She went to the emergency room on Thursday evening and had multiple episodes of emesis but left the ER prior to being seen by a physician. She states that after she threw-up she felt better.  She states that she felt okay on Saturday and Sunday morning. However throughout the day on Sunday she developed worsening abdominal pain mainly in her upper abdomen. She describes it as constant. She describes it as a bubble in her stomach. She had multiple episodes of vomiting. She states that everything she ate on Saturday and early Sunday came back up. She still reports having air and stool in her ostomy bag. She denies any fevers or chills. She denies any dysuria. She denies any bleeding from her lips or rectum. She denies any chest pain shortness of breath.  Objective: Vital signs in last 24 hours: Temp:  [97.7 F (36.5 C)-98.4 F (36.9 C)] 98.4 F (36.9 C) (06/24 0259) Pulse Rate:  [109-118] 109  (06/24 0625) Resp:  [16-24] 16  (06/24 0625) BP: (113-129)/(81-89) 129/81 mmHg (06/24 0625) SpO2:  [99 %-100 %] 99 % (06/24 0625)    Intake/Output from previous day:   Intake/Output this shift:    Alert, nad, cta Mild tachy Soft, TTP in upper abd. A little distension. hypoBS Ostomy RLQ - pink, viable. Some air and stool in bag.  Lower midline incision almost closed - 0.5cm opening. No cellulitis.  Perc drain on L - purulent No edema  Lab Results:   Basename 03/16/12 0222 03/16/12 0153  WBC -- 14.4*  HGB 13.6 11.5*  HCT 40.0 34.6*  PLT -- 550*   BMET  Basename 03/16/12 0222  NA 135  K 3.4*  CL 98  CO2 --  GLUCOSE 92  BUN 4*  CREATININE 0.50  CALCIUM --   PT/INR No results found for this basename: LABPROT:2,INR:2 in the last 72 hours ABG No results found for this basename: PHART:2,PCO2:2,PO2:2,HCO3:2 in the last 72 hours  Studies/Results: Ct Abdomen Pelvis W Contrast  03/16/2012  *RADIOLOGY REPORT*  Clinical Data: Nausea, vomiting, and abdominal pain.  CT ABDOMEN AND PELVIS WITH CONTRAST  Technique:  Multidetector CT imaging of the abdomen and pelvis was performed following the standard protocol during bolus administration of intravenous contrast.  Contrast: 80mL OMNIPAQUE IOHEXOL 300 MG/ML  SOLN  Comparison: 02/27/2012  Findings: Atelectasis in the lung bases, improved since the previous study.  Focal subcapsular fluid collection along the right anterolateral liver edge is decreasing in size since the previous study, now measuring about 18 mm maximal diameter.  Left subdiaphragmatic and subcapsular splenic collections have also decreased in size.  Surgical absence of the gallbladder.  Pancreas, adrenal glands, kidneys, and abdominal aorta are unremarkable. Infiltration or edema in the mesentery.  Small amount of free fluid in Morison's pouch on the right with mild fluid in the  pericolic gutters bilaterally.  Tiny free air collections are demonstrated in the abdomen, likely remaining from previous surgery and catheter insertions.  Small umbilical hernia with fluid and air.  Skin clips have been removed.  Mildly dilated small bowel without obvious wall thickening.  Changes  could represent early obstruction versus ileus.  Stool in the right colon with decompression of the distal colon.  Colonic wall thickening cannot be excluded due to decompression.  Right lower quadrant ileostomy.  Surgical clips in the transverse colon region.  Pelvis:  Small amount of free fluid in the pelvis.  Interval development of bladder wall thickening and bladder gas which could represent fistula versus bladder infection.  Left lower quadrant pelvic drainage catheter remains unchanged in position.  The previously demonstrated abscess cavity is significantly decreased in size.  IMPRESSION: Left lower quadrant pelvic drainage catheter remains in place. There is significant decrease in size of the left pelvic, hepatic subcapsular, and splenic subcapsular/subdiaphragmatic abscess is since the previous study.  Small bowel are mildly dilated which could be due to early obstruction or ileus.  Follow-up recommended depending on the clinical course.  Interval development of gas in the bladder which could represent fistula versus infection.  Small amount of free fluid in the pelvis with mesenteric infiltration and scattered fluid along the pericolic gutters bilaterally.  Original Report Authenticated By: Marlon Pel, M.D.   Dg Abd Acute W/chest  03/16/2012  *RADIOLOGY REPORT*  Clinical Data: Left lower quadrant pain for 3 hours.  Recent history of bowel obstruction with colostomy surgery in May 2013.  ACUTE ABDOMEN SERIES (ABDOMEN 2 VIEW & CHEST 1 VIEW)  Comparison: 02/02/2012  Findings: Normal heart size and pulmonary vascularity.  No focal airspace consolidation in the lungs.  Mild linear atelectasis in the lung bases.  No pneumothorax.  No blunting of costophrenic angles.  Mild gaseous distension of small bowel with multiple air-fluid levels suggesting early mechanical obstruction.  No free intra- abdominal air.  Postsurgical changes scattered throughout the abdomen with drainage catheter in the left  lower quadrant.  Stool in the right colon.  IMPRESSION: No evidence of active pulmonary disease.  Mild small bowel distension with multiple air-fluid levels suggesting early obstruction.  Original Report Authenticated By: Marlon Pel, M.D.    Anti-infectives: Anti-infectives    None      Assessment/Plan: 36 year old [place] American female with a history of cervical cancer with radiation treatment who developed a sigmoid stricture. She ultimately underwent when a sigmoid colectomy with a diverting loop ileostomy complicated by anastomotic breakdown and formation of multiple intra-abdominal abscesses these requiring percutaneous drainage. She also developed refractory thrombocytopenia but this subsequently improved. She has now developed findings consistent with partial small bowel obstruction. However her CT scan shows that the majority of her intra-abdominal abscesses have resolved.  Hypokalemia Leukocytosis  We will admit the patient for bowel rest. I will off on NG tube placement at the time being. I advised the patient that we will place a nasogastric tube should she develop worsening pain or emesis. She still has purulent drainage from her one remaining percutaneous drain. She does need antibiotic therapy. I will hold off on starting antibiotics until discussing with infectious disease in an attempt to avoid a repeat thrombocytopenia. Vanessa [name]. [name], MD, FACS General, Bariatric, & Minimally Invasive Surgery [facility], [place]   LOS: 0 days    Atilano Ina 03/16/2012

## 2012-03-17 ENCOUNTER — Inpatient Hospital Stay (HOSPITAL_COMMUNITY): Payer: Medicaid Other

## 2012-03-17 LAB — CBC
HCT: 31.9 % — ABNORMAL LOW (ref 36.0–46.0)
MCV: 97.3 fL (ref 78.0–100.0)
Platelets: 581 10*3/uL — ABNORMAL HIGH (ref 150–400)
RBC: 3.28 MIL/uL — ABNORMAL LOW (ref 3.87–5.11)
WBC: 12.1 10*3/uL — ABNORMAL HIGH (ref 4.0–10.5)

## 2012-03-17 LAB — COMPREHENSIVE METABOLIC PANEL
Albumin: 2.8 g/dL — ABNORMAL LOW (ref 3.5–5.2)
BUN: 4 mg/dL — ABNORMAL LOW (ref 6–23)
Creatinine, Ser: 0.48 mg/dL — ABNORMAL LOW (ref 0.50–1.10)
Total Protein: 8 g/dL (ref 6.0–8.3)

## 2012-03-17 MED ORDER — METOCLOPRAMIDE HCL 5 MG/ML IJ SOLN
10.0000 mg | Freq: Four times a day (QID) | INTRAMUSCULAR | Status: DC
  Administered 2012-03-17 – 2012-03-19 (×9): 10 mg via INTRAVENOUS
  Filled 2012-03-17 (×12): qty 2

## 2012-03-17 NOTE — Progress Notes (Signed)
[facility] Daily Progress Note Subjective: Feels somewhat better, but still with c/o feeling full of "gas", no NV reported. Minimal al pain.   Objective: Vital signs in last 24 hours: Temp:  [97.8 F (36.6 C)-98.7 F (37.1 C)] 98 F (36.7 C) (06/25 0532) Pulse Rate:  [98-107] 101  (06/25 0532) Resp:  [15-18] 18  (06/25 0532) BP: (115-143)/(74-96) 131/96 mmHg (06/25 0532) SpO2:  [96 %-100 %] 100 % (06/25 0532)   Intake/Output from previous day: 06/24 0701 - 06/25 0700 In: 1782.7 [I.V.:1782.7] Out: -   General appearance: alert, cooperative and no distress Pulm: CTAB CV: RRR, no m/r/g Abdomen: soft, tympanic, hypoactive BS, illeostomy draining dark liquid. Abdominal drain draining tanish, thin liquid; consistent with previously noted drainage on discharge.  Lab Results:   Basename 03/17/12 0637 03/16/12 0222 03/16/12 0153  WBC 12.1* -- 14.4*  HGB 10.4* 13.6 --  HCT 31.9* 40.0 --  PLT 581* -- 550*   BMET  Basename 03/17/12 0637 03/16/12 0222  NA 130* 135  K 4.0 3.4*  CL 97 98  CO2 23 --  GLUCOSE 103* 92  BUN 4* 4*  CREATININE 0.48* 0.50  CALCIUM 11.0* --   Studies/Results: Ct Abdomen Pelvis W Contrast  03/16/2012  *RADIOLOGY REPORT*  Clinical Data: Nausea, vomiting, and abdominal pain.  CT ABDOMEN AND PELVIS WITH CONTRAST  Technique:  Multidetector CT imaging of the abdomen and pelvis was performed following the standard protocol during bolus administration of intravenous contrast.  Contrast: 80mL OMNIPAQUE IOHEXOL 300 MG/ML  SOLN  Comparison: 02/27/2012  Findings: Atelectasis in the lung bases, improved since the previous study.  Focal subcapsular fluid collection along the right anterolateral liver edge is decreasing in size since the previous study, now measuring about 18 mm maximal diameter.  Left subdiaphragmatic and subcapsular splenic collections have also decreased in size.  Surgical absence of the gallbladder.  Pancreas, adrenal glands, kidneys,  and abdominal aorta are unremarkable. Infiltration or edema in the mesentery.  Small amount of free fluid in Morison's pouch on the right with mild fluid in the pericolic gutters bilaterally.  Tiny free air collections are demonstrated in the abdomen, likely remaining from previous surgery and catheter insertions.  Small umbilical hernia with fluid and air.  Skin clips have been removed.  Mildly dilated small bowel without obvious wall thickening.  Changes could represent early obstruction versus ileus.  Stool in the right colon with decompression of the distal colon.  Colonic wall thickening cannot be excluded due to decompression.  Right lower quadrant ileostomy.  Surgical clips in the transverse colon region.  Pelvis:  Small amount of free fluid in the pelvis.  Interval development of bladder wall thickening and bladder gas which could represent fistula versus bladder infection.  Left lower quadrant pelvic drainage catheter remains unchanged in position.  The previously demonstrated abscess cavity is significantly decreased in size.  IMPRESSION: Left lower quadrant pelvic drainage catheter remains in place. There is significant decrease in size of the left pelvic, hepatic subcapsular, and splenic subcapsular/subdiaphragmatic abscess is since the previous study.  Small bowel are mildly dilated which could be due to early obstruction or ileus.  Follow-up recommended depending on the clinical course.  Interval development of gas in the bladder which could represent fistula versus infection.  Small amount of free fluid in the pelvis with mesenteric infiltration and scattered fluid along the pericolic gutters bilaterally.  Original Report Authenticated By: Marlon Pel, M.D.   Dg Abd 2 Views  03/17/2012  *  RADIOLOGY REPORT*  Clinical Data: Abdominal pain, emesis, history of colostomy  ABDOMEN - 2 VIEW  Comparison: 06/24/2013radiographs and CT  Findings: Surgical clips right upper quadrant likely  cholecystectomy. Percutaneous drain left lower quadrant. Stool right colon. Scattered air-fluid levels on upright view with a few minimally prominent loops of small bowel the mid abdomen. These are nonspecific however and could be result of ileus or obstruction. No definite free intraperitoneal air. A questionable focus of gas lucency projects over the liver on the upright view, similar to 03/16/2012 exam, though no extraluminal gas collection was identified at this site by CT. No acute osseous findings.  IMPRESSION: Few mildly prominent loops of air-filled small bowel in mid abdomen with scattered air-fluid levels, question ileus versus obstruction.  Original Report Authenticated By: Lollie Marrow, M.D.   Dg Abd Acute W/chest  03/16/2012  *RADIOLOGY REPORT*  Clinical Data: Left lower quadrant pain for 3 hours.  Recent history of bowel obstruction with colostomy surgery in May 2013.  ACUTE ABDOMEN SERIES (ABDOMEN 2 VIEW & CHEST 1 VIEW)  Comparison: 02/02/2012  Findings: Normal heart size and pulmonary vascularity.  No focal airspace consolidation in the lungs.  Mild linear atelectasis in the lung bases.  No pneumothorax.  No blunting of costophrenic angles.  Mild gaseous distension of small bowel with multiple air-fluid levels suggesting early mechanical obstruction.  No free intra- abdominal air.  Postsurgical changes scattered throughout the abdomen with drainage catheter in the left lower quadrant.  Stool in the right colon.  IMPRESSION: No evidence of active pulmonary disease.  Mild small bowel distension with multiple air-fluid levels suggesting early obstruction.  Original Report Authenticated By: Marlon Pel, M.D.    Anti-infectives: Anti-infectives    None      Assessment/Plan: 36 year old Female s/p sigmoid colon resection and diverting loop ileostomy who had a long, complicated hospital course with multiple abdominal abscesses due to anastomotic breakdown requiring drainage and JP  tubes. She was discharged 6/21 in stable condition and presented to the hospital on 5/24 with abdominal pain, nausea and vomiting, now thought to have a small bowel obstruction vs. Ileus  1. Small bowel obstruction - continue NPO for now. Continue simethicone and encourage ambulation. Add Reglan to see if this will improve peristalsis. 2. FEN/GI- patient with new hypercalcemia, corrects to 12.  Unclear etiology.  Will increase IVF and continue to monitor.   3. Await ID's input for ? need antibiotic therapy.  She is afebrile, but drain still with purulent output (stable for > 1 week).    LOS: 1 day    Donnalyn Juran 03/17/2012

## 2012-03-17 NOTE — Clinical Social Work Psychosocial (Signed)
     Clinical Social Work Department BRIEF PSYCHOSOCIAL ASSESSMENT 03/17/2012  Patient:  Vanessa [name], Vanessa [name]     Account Number:  [id number]     Admit date:  03/16/2012  Clinical Social Worker:  Dennison Bulla  Date/Time:  03/17/2012 11:00 AM  Referred by:  Physician  Date Referred:  03/17/2012 Referred for  Advanced Directives   Other Referral:   Interview type:  Patient Other interview type:    PSYCHOSOCIAL DATA Living Status:  FAMILY Admitted from facility:   Level of care:   Primary support name:  Toniann Fail Primary support relationship to patient:  FAMILY Degree of support available:   Adequate    CURRENT CONCERNS Current Concerns  Other - See comment   Other Concerns:   Advanced directives    SOCIAL WORK ASSESSMENT / PLAN CSW received referral to assist with advanced directives. CSW is familiar with patient from previous admission. Patient was walking in the room when CSW entered. Patient reported she was feeling better and was interested in speaking with CSW.    CSW spoke about referral and patient reported she was interested in [place]. Advanced directives packet has living will included as well. CSW explained each section and answered some questions. Patient stated she will review packet and agreed for CSW to follow up with tomorrow to notarize packet if patient is ready.   Assessment/plan status:  Psychosocial Support/Ongoing Assessment of Needs Other assessment/ plan:   Information/referral to community resources:   Advanced directives packet    PATIENTS/FAMILYS RESPONSE TO PLAN OF CARE: Patient was alert and oriented. Patient was engaged throughout assessment and agreeable to CSW following up tomorrow to determine if she had further questions or wanted to notarize packet.

## 2012-03-17 NOTE — Progress Notes (Signed)
INITIAL ADULT NUTRITION ASSESSMENT Date: 03/17/2012   Time: 10:16 AM Reason for Assessment: nutrition risk; wt loss  ASSESSMENT: Female 36 y.o.  Dx: nausea, vomiting  Hx:  Past Medical History  Diagnosis Date  . Hypertension   . Cervical cancer ~ 2011  . History of blood transfusion     "I had ~ 7 in 01/2012"   Past Surgical History  Procedure Date  . Radiation implants ~ 2011    "for cervical cancer"  . Colonoscopy 01/30/2012    Procedure: COLONOSCOPY;  Surgeon: Beverley Fiedler, MD;  Location: [facility] ENDOSCOPY;  Service: Gastroenterology;  Laterality: N/A;  . Colonoscopy 01/30/2012    Procedure: COLONOSCOPY;  Surgeon: Beverley Fiedler, MD;  Location: [facility] OR;  Service: Gastroenterology;  Laterality: N/A;  . Colostomy revision 02/04/2012    Procedure: COLON RESECTION SIGMOID;  Surgeon: Cherylynn Ridges, MD;  Location: [facility] OR;  Service: General;  Laterality: N/A;  . Cholecystectomy 2006    Related Meds:  Scheduled Meds:   . pantoprazole (PROTONIX) IV  40 mg Intravenous QHS  . simethicone  40 mg Oral QID  . white petrolatum      . DISCONTD: iohexol  20 mL Oral Q1 Hr x 2   Continuous Infusions:   . dextrose 5 % and 0.45 % NaCl with KCl 20 mEq/L 125 mL/hr at 03/17/12 0612  . DISCONTD: dextrose 5 % and 0.45 % NaCl with KCl 20 mEq/L     PRN Meds:.HYDROmorphone (DILAUDID) injection, ondansetron   Ht:  162.6 cm (data obtained from previous admission)  Wt:  65.3 kg (RD obtained via bed scale)  Ideal Wt:    54.5 kg % Ideal Wt: 116%  Usual Wt: 158-161 lbs % Usual Wt: 90.9%  There is no height or weight on file to calculate BMI.  Food/Nutrition Related Hx: Pt with recent admission for nausea, vomiting, abdominal pain found to have anastomotic leak, ileus, stricture, thrombocytopenia with slow diet advance and decreased tolerance leading to parenteral nutrition support for several weeks.  Pt was able to wean from TPN and resume a PO diet with tolerance prior to d/c.   Labs:  CMP       Component Value Date/Time   NA 130* 03/17/2012 0637   K 4.0 03/17/2012 0637   CL 97 03/17/2012 0637   CO2 23 03/17/2012 0637   GLUCOSE 103* 03/17/2012 0637   BUN 4* 03/17/2012 0637   CREATININE 0.48* 03/17/2012 0637   CALCIUM 11.0* 03/17/2012 0637   CALCIUM 10.7* 02/01/2012 0705   PROT 8.0 03/17/2012 0637   ALBUMIN 2.8* 03/17/2012 0637   AST 21 03/17/2012 0637   ALT 10 03/17/2012 0637   ALKPHOS 80 03/17/2012 0637   BILITOT 0.3 03/17/2012 0637   GFRNONAA >90 03/17/2012 0637   GFRAA >90 03/17/2012 0637    CBC    Component Value Date/Time   WBC 12.1* 03/17/2012 0637   WBC 5.3 10/22/2010 1258   RBC 3.28* 03/17/2012 0637   RBC 3.48* 10/22/2010 1258   HGB 10.4* 03/17/2012 0637   HGB 11.2* 10/22/2010 1258   HCT 31.9* 03/17/2012 0637   HCT 33.1* 10/22/2010 1258   PLT 581* 03/17/2012 0637   PLT 287 10/22/2010 1258   MCV 97.3 03/17/2012 0637   MCV 94.9 10/22/2010 1258   MCH 31.7 03/17/2012 0637   MCH 29.1 09/19/2010 0847   MCHC 32.6 03/17/2012 0637   MCHC 33.8 10/22/2010 1258   RDW 20.2* 03/17/2012 0637   RDW 25.0* 10/22/2010 1258  LYMPHSABS 1.8 03/16/2012 0153   LYMPHSABS 0.5* 10/22/2010 1258   MONOABS 1.2* 03/16/2012 0153   MONOABS 0.6 10/22/2010 1258   EOSABS 0.2 03/16/2012 0153   EOSABS 0.1 10/22/2010 1258   BASOSABS 0.0 03/16/2012 0153   BASOSABS 0.0 10/22/2010 1258   Intake: NPO Output:   Intake/Output Summary (Last 24 hours) at 03/17/12 1105 Last data filed at 03/17/12 1055  Gross per 24 hour  Intake 1782.67 ml  Output    100 ml  Net 1682.67 ml   100 mL output from ileostomy Thick drainage from remaining drain noted  Diet Order: NPO  Supplements/Tube Feeding:  IVF:    dextrose 5 % and 0.45 % NaCl with KCl 20 mEq/L Last Rate: 125 mL/hr at 03/17/12 0612  DISCONTD: dextrose 5 % and 0.45 % NaCl with KCl 20 mEq/L     Estimated Nutritional Needs:   Kcal: 1950-2100 kcal Protein: 84-97g Fluid: ~2.0 L/day  Pt admitted with increased nausea and vomiting.  Pt s/p recent bowel surgery with  colostomy and one remaining drain.  Pt was able to tolerate PO diet prior to d/c and ate several foods at home for 3 days before sudden onset of sharp abdominal pain.  Pt did have one additional episode of nausea/vomiting, however she describes this as being due to sensitivity to smell- once she removed herself from situation and vomited, she felt better.  The pain she experience prior to this admission was much different.   Noted drain with purulent drainage. Pt is tolerating ice chips. Despite being able to eat some foods, pt has demonstrated further wt loss.  She now weighs 143 lbs which is 9.4% wt loss in <3 months.  Pt wt as d/c last week was 150 lbs, which is a 4.6% loss in 1 week. Due to significant wt loss and intake estimated to be <75% of needs per food recall, pt qualifies for severe malnutrition of chronic disease.    Pt did require TPN during recent admission to meet needs due to prolonged NPO status.  TPN regimen during previous admission met 93% estimated kcal needs, and 100% protein needs.  NUTRITION DIAGNOSIS: -Altered GI function (NI-1.4).  Status: Ongoing  RELATED TO: complicated anatomy, s/p several surgeries, s/p cervical cancer  AS EVIDENCE BY: pt unable to tolerate PO diet  MONITORING/EVALUATION(Goals): 1.  Food/Beverage; diet advancement per MD discretion.  Pt with difficulty tolerating diets in the past 2.  Parenteral nutrition; initiation if no diet advancement anticipated in 48-72 hrs.  EDUCATION NEEDS: -No education needs identified at this time  INTERVENTION: 1.  Parenteral nutrition;  Pt required nutrition support during recent hospitalization due to prolonged NPO status.  If NPO expected to continue, recommend initiating support to prevent gaps in nutrition provision. 2.  Enteral nutrition; Pt was able to tolerance diet at d/c, however was unable to consume enough calories to support increased needs required for healing and wt maintenance.  Pt may benefit from  consideration of long-term nutrition support 3.  General healthful diet; RD to follow closely for resume of diet and long-term nutrition-related plans.  If PO to be sole source nutrition for pt, will need careful diet planning to ensure she is meeting her needs post d/c.  Dietitian #: [phone]  DOCUMENTATION CODES Per approved criteria  -Severe malnutrition in the context of chronic illness    Loyce Dys [facility] 03/17/2012, 10:16 AM

## 2012-03-17 NOTE — Progress Notes (Signed)
Slightly improved More ileostomy output. Continue bowel rest for now.  Wilmon Arms. Corliss Skains, MD, [facility]  03/17/2012 12:14 PM

## 2012-03-18 LAB — BASIC METABOLIC PANEL
BUN: 3 mg/dL — ABNORMAL LOW (ref 6–23)
Calcium: 10.4 mg/dL (ref 8.4–10.5)
GFR calc non Af Amer: >90 mL/min (ref >90)
Glucose, Bld: 97 mg/dL (ref 70–99)

## 2012-03-18 MED ORDER — TRAMADOL HCL 50 MG PO TABS
100.0000 mg | ORAL_TABLET | Freq: Four times a day (QID) | ORAL | Status: DC
  Administered 2012-03-18 – 2012-03-19 (×4): 100 mg via ORAL
  Filled 2012-03-18 (×7): qty 2

## 2012-03-18 NOTE — Progress Notes (Signed)
Clinical Social Work  CSW followed up with patient regarding advanced directives to determine if patient wanted CSW to notarize or had any follow up questions. Patient reports that she has not had time to review packet or decide her wishes. CSW asked patient to contact her if she completed packet and CSW will notarize. CSW informed patient that she could get packet notarized in the community. CSW is signing off but available if needed.  [place], [place] [phone]

## 2012-03-18 NOTE — Progress Notes (Signed)
Less distended; more ileostomy output PSBO resolving Advance diet Possible discharge tomorrow.  Wilmon Arms. Corliss Skains, MD, [facility]  03/18/2012 1:00 PM

## 2012-03-18 NOTE — Progress Notes (Signed)
  Subjective: Feels good this morning, wants to go home! No c/op of bloated feeling this morning, states that she is putting out more "poop" from her illeostomy today.  Objective: Vital signs in last 24 hours: Temp:  [97.8 F (36.6 C)-98.4 F (36.9 C)] 98.1 F (36.7 C) (06/26 0540) Pulse Rate:  [85-106] 106  (06/26 0540) Resp:  [18-20] 20  (06/26 0540) BP: (120-135)/(82-98) 131/82 mmHg (06/26 0540) SpO2:  [99 %-100 %] 100 % (06/26 0540) Weight:  [143 lb 11.2 oz (65.182 kg)] 143 lb 11.2 oz (65.182 kg) 2023-04-17 1100) Last BM Date: 2012-04-16  Intake/Output from previous day: Apr 17, 2023 0701 - 06/26 0700 In: 1995.8 [I.V.:1995.8] Out: 550 [Stool:550] Intake/Output this shift: Total I/O In: 1031 [I.V.:1031] Out: 350 [Stool:350]  General appearance: alert, cooperative and no distress,afebrile, abdominal drain draining tanish, thin liquid; consistent with previously noted drainage on discharge.  ileostomy draining brownish fluid,(900cc over past 24 hrs)  abdomen otherwise soft, non tender, no bloating, positive BS. Lab Results:   Basename 2012/04/16 0637 03/16/12 0222 03/16/12 0153  WBC 12.1* -- 14.4*  HGB 10.4* 13.6 --  HCT 31.9* 40.0 --  PLT 581* -- 550*   BMET  Basename April 16, 2012 0637 03/16/12 0222  NA 130* 135  K 4.0 3.4*  CL 97 98  CO2 23 --  GLUCOSE 103* 92  BUN 4* 4*  CREATININE 0.48* 0.50  CALCIUM 11.0* --   PT/INR No results found for this basename: LABPROT:2,INR:2 in the last 72 hours ABG No results found for this basename: PHART:2,PCO2:2,PO2:2,HCO3:2 in the last 72 hours  Studies/Results: Dg Abd 2 Views  Apr 16, 2012  *RADIOLOGY REPORT*  Clinical Data: Abdominal pain, emesis, history of colostomy  ABDOMEN - 2 VIEW  Comparison: 06/24/2013radiographs and CT  Findings: Surgical clips right upper quadrant likely cholecystectomy. Percutaneous drain left lower quadrant. Stool right colon. Scattered air-fluid levels on upright view with a few minimally prominent loops of small  bowel the mid abdomen. These are nonspecific however and could be result of ileus or obstruction. No definite free intraperitoneal air. A questionable focus of gas lucency projects over the liver on the upright view, similar to 03/16/2012 exam, though no extraluminal gas collection was identified at this site by CT. No acute osseous findings.  IMPRESSION: Few mildly prominent loops of air-filled small bowel in mid abdomen with scattered air-fluid levels, question ileus versus obstruction.  Original Report Authenticated By: Lollie Marrow, M.D.    Anti-infectives: Anti-infectives    None      Assessment/Plan: s/p * No surgery found * Continue with NPO Continue Reglan,simethicone and encourage ambulation.  Await ID's input for ? need antibiotic therapy. Currently do not think she needs ABX as she is afebrile, WBC last report was 12.1 down from 14.4 2 days previously:but drain still with purulent output (stable for > 1 week).  Hypercalcemia (yesterday was 12) is slowly correcting with IVF (11.4) today. Will continue with IVF.  Possible discharge soon? Await Dr. Lyndee Leo input.      LOS: 2 days    Vanessa [name] 03/18/2012

## 2012-03-19 MED ORDER — OXYCODONE-ACETAMINOPHEN 5-325 MG PO TABS
1.0000 | ORAL_TABLET | ORAL | Status: DC | PRN
  Administered 2012-03-19: 1 via ORAL
  Filled 2012-03-19: qty 1

## 2012-03-19 MED ORDER — OXYCODONE-ACETAMINOPHEN 5-325 MG PO TABS: 1.0000 | ORAL_TABLET | ORAL | Status: DC | PRN

## 2012-03-19 MED ORDER — SIMETHICONE 40 MG/0.6ML PO SUSP: 40.0000 mg | Freq: Four times a day (QID) | ORAL | Status: DC

## 2012-03-19 NOTE — Discharge Instructions (Signed)
Ileus The intestine (bowel, or gut) is a long muscular tube connecting your stomach to your rectum. If the intestine stops working, food cannot pass through. This is called an ileus. This can happen for a variety of reasons. Ileus is a major medical problem that usually requires hospitalization. If your intestine stops working because of a blockage, this is called a bowel obstruction, and is a different condition. CAUSES   Surgery in your abdomen. This can last from a few hours to a few days.   An infection or inflammation in the belly (abdomen). This includes inflammation of the lining of the abdomen (peritonitis).   Infection or inflammation in other parts of the body, such as pneumonia or pancreatitis.   Passage of gallstones or kidney stones.   Damage to the nerves or blood vessels which go to the bowel.   Imbalance in the salts in the blood (electrolytes).   Injury to the brain and or spinal cord.   Medications. Many medications can cause ileus or make it worse. The most common of these are strong pain medications.  SYMPTOMS  Symptoms of bowel obstruction come from the bowel inactivity. They may include:  Bloating. Your belly gets bigger (distension).   Pain or discomfort in the abdomen.   Poor appetite, feeling sick to your stomach (nausea) and vomiting.   You may also not be able to hear your normal bowel sounds, such as "growling" in your stomach.  DIAGNOSIS   Your history and a physical exam will usually suggest to your caregiver that you have an ileus.   X-rays or a CT scan of your abdomen will confirm the diagnosis. X-rays, CT scans and lab tests may also suggest the cause.  TREATMENT   Rest the intestine until it starts working again. This is most often accomplished by:   Stopping intake of oral food and drink. Dehydration is prevented by using IV (intravenous) fluids.   Sometimes, a naso-gastric tube (NG tube) is needed. This is a narrow plastic tube inserted  through your nose and into your stomach. It is connected to suction to keep the stomach emptied out. This also helps treat the nausea and vomiting.   If there is an imbalance in the electrolytes, they are corrected with supplements in your intravenous fluids.   Medications that might make an ileus worse might be stopped.   There are no medications that reliably treat ileus, though your caregiver may suggest a trial of certain medications.   If your condition is slow to resolve, you will be re-evaluated to be sure another condition, such as a blockage, is not present.  Ileus is common and usually has a good outcome. Depending on cause of your ileus, it usually can be treated by your caregivers with good results. Sometimes, specialists (surgeons or gastroenterologists) are asked to assist in your care.  HOME CARE INSTRUCTIONS   Follow your caregiver's instructions regarding diet and fluid intake. This will usually include drinking plenty of clear fluids, avoiding alcohol and caffeine, and eating a gentle diet.   Follow your caregiver's instructions regarding activity. A period of rest is sometimes advised before returning to work or school.   Take only medications prescribed by your caregiver. Be especially careful with narcotic pain medication, which can slow your bowel activity and contribute to ileus.   Keep any follow-up appointments with your caregiver or specialists.  SEEK MEDICAL CARE IF:   You have a recurrence of nausea, vomiting or abdominal discomfort.   You develop  fever of more than 102 F (38.9 C).  SEEK IMMEDIATE MEDICAL CARE IF:   You have severe abdominal pain.   You are unable to keep fluids down.  Document Released: 09/12/2003 Document Revised: 08/29/2011 Document Reviewed: 01/12/2009 [facility] Patient Information 2012 [place], [place].Intestinal Obstruction An intestinal obstruction comes from a physical blockage in the bowel. Different problems in the bowel may cause  these blockages.  CAUSES   Adhesions from previous surgeries   Cancer or tumor   A hernia, which is a condition in which a portion of the bowel bulges out through an opening or weakness in the abdomen (belly). This sometimes squeezes the bowel.   A swallowed object.   Blockage (impaction) with worms is common in third world countries.   A twisting of the bowel or telescoping of a portion of the bowel into another portion (intussusceptions)   Anything that stops food from going through from the stomach to the anus.  SYMPTOMS  Symptoms of bowel obstruction may result in your belly getting bigger (bloating), nausea, vomiting, explosive diarrhea, explosive stool. You may not be able to hear your normal bowel sounds (such as "growling in your stomach"). You may also stop having bowel movements or passing gas. DIAGNOSIS  Usually this condition is diagnosed with a history and an examination. Often, lab studies (blood work) and x-rays may be used to find the cause. TREATMENT  The main treatment of this condition is to rest the intestine (gut). Often times the obstruction may relieve itself and allow the gut to start working again. Think of the gut like a balloon that is blown up (filled with trapped food and water) which has squeezed into a hole or area which it cannot get through.   If the obstruction is complete, an NG tube (nasogastric) is passed through the nose and into the stomach. It is then connected to suction to keep the stomach emptied out. This also helps treat the nausea and vomiting.   If there is an imbalance in the electrolytes, they are corrected with intravenous fluids. These have the proper chemicals in them to correct the problem.   If the reason for the obstruction (blockage) does not get better with conservative (non-surgical) treatment, surgery may be necessary. Sometimes, surgery is done immediately if your surgeon knows that the problem is not going to get better with  conservative treatment.  PROGNOSIS  Depending on what the problem is, most of these problems can be treated by your caregivers with good results. Your surgeon will discuss the best course of action to take, with you or your loved ones. FOLLOWING SURGERY Seek immediate medical attention if you have:  Increasing abdominal pain, repeated vomiting, dehydration or fainting.   Severe weakness, chest pain or back pain.   Blood in your vomit, stool or you have tarry stool.  Document Released: 11/30/2003 Document Revised: 08/29/2011 Document Reviewed: 04/29/2008 [facility] Patient Information 2012 [place], [place].

## 2012-03-19 NOTE — Discharge Summary (Signed)
Physician Discharge Summary  Patient ID: Vanessa [name] MRN: [id number] DOB/AGE: 1976/08/06 36 y.o.  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 She was discharged 6/21 in stable condition and presented to the hospital on 5/24 with abdominal pain, nausea and vomiting, now thought to have a small bowel obstruction vs. Ileus   Consults: None  Significant Diagnostic Studies: labs,radiology,microbiology.  Treatments: IV hydration, analgesia, and NPO.  Discharge Exam: Blood pressure 133/92, pulse 100, temperature 97.9 F (36.6 C), temperature source Oral, resp. rate 16, height 5\' 4"  (1.626 m), weight 143 lb 11.2 oz (65.182 kg), SpO2 99.00%. General appearance: alert, cooperative, appears stated age and no distress abdominal drain draining tanish, thin liquid; remains consistent with previously noted drainage.  ileostomy draining brownish fluid,(1600 cc over past 24 hrs) abdomen otherwise soft, non tender, no bloating, positive BS.   Disposition: Home -self care, follow up with our office in 1 weeks time.  Discharge Orders    Future Appointments: Provider: Department: Dept Phone: Center:   04/17/2012 2:15 PM Cherylynn Ridges, MD [facility] [phone] None   04/23/2012 11:00 AM Oneita Hurt, MD [facility] [phone] None     Medication List  As of 03/19/2012  1:12 PM   ASK your doctor about these medications         ferrous fumarate 325 (106 FE) MG Tabs   Commonly known as: HEMOCYTE - 106 mg FE   Take 1 tablet (106 mg of iron total) by mouth daily.      hydrochlorothiazide 12.5 MG tablet   Commonly  known as: HYDRODIURIL   Take 12.5 mg by mouth daily.      metoCLOPramide 10 MG tablet   Commonly known as: REGLAN   Take 1 tablet (10 mg total) by mouth 3 (three) times daily before meals.      oxyCODONE 20 MG 12 hr tablet   Commonly known as: OXYCONTIN   Take 1 tablet (20 mg total) by mouth 2 (two) times daily.      oxyCODONE-acetaminophen 5-325 MG per tablet   Commonly known as: PERCOCET   Take 1-2 tablets by mouth every 4 (four) hours as needed (pain).      pantoprazole 40 MG tablet   Commonly known as: PROTONIX   Take 1 tablet (40 mg total) by mouth daily at 12 noon.      prochlorperazine 5 MG tablet   Commonly known as: COMPAZINE   Take 1 tablet (5 mg total) by mouth 4 (four) times daily -  with meals and at bedtime.      zolpidem 10 MG tablet   Commonly known as: AMBIEN   Take 1 tablet (10 mg total) by mouth at bedtime as needed for sleep.             SignedBlenda Mounts 03/19/2012, 1:12 PM

## 2012-03-19 NOTE — Discharge Summary (Signed)
Vanessa [name]. Corliss Skains, MD, [facility] Surgery  03/19/2012 1:37 PM

## 2012-03-19 NOTE — Progress Notes (Signed)
Will discharge this afternoon.  Follow-up as previously arranged.  Wilmon Arms. Corliss Skains, MD, [facility] Surgery  03/19/2012 1:00 PM

## 2012-03-19 NOTE — Progress Notes (Signed)
Discharge home.

## 2012-03-19 NOTE — Progress Notes (Signed)
Patient ID: Vanessa [name], female   DOB: 1975/10/23, 36 y.o.   MRN: [id number]    Subjective: Feels good this morning, wants to go home! No c/op of bloated feeling this morning. Eating regular diet without NV, but does experience rapid satiaty.  Objective:  Vital signs in last 24 hours: Temp:  [97.8 F (36.6 C)-98.4 F (36.9 C)] 97.9 F (36.6 C) (06/27 0540) Pulse Rate:  [88-100] 100  (06/27 0540) Resp:  [16-18] 16  (06/27 0540) BP: (118-133)/(79-92) 133/92 mmHg (06/27 0540) SpO2:  [99 %-100 %] 99 % (06/27 0540) Last BM Date: 03/18/12  Intake/Output from previous day: 06/26 0701 - 06/27 0700 In: 3396.1 [P.O.:120; I.V.:3276.1] Out: 1705 [Drains:105; Stool:1600] Intake/Output this shift: Total I/O In: -  Out: 100 [Stool:100]  General appearance: alert, cooperative and no distress,afebrile, abdominal drain draining tanish, thin liquid; remains consistent with previously noted drainage.  ileostomy draining brownish fluid,(1600 cc over past 24 hrs)  abdomen otherwise soft, non tender, no bloating, positive BS. Lab Results:   [facility] 03/17/12 0637  WBC 12.1*  HGB 10.4*  HCT 31.9*  PLT 581*   BMET  Basename 03/18/12 0938 03/17/12 0637  NA 133* 130*  K 4.1 4.0  CL 99 97  CO2 24 23  GLUCOSE 97 103*  BUN 3* 4*  CREATININE 0.48* 0.48*  CALCIUM 10.4 11.0*   PT/INR No results found for this basename: LABPROT:2,INR:2 in the last 72 hours ABG No results found for this basename: PHART:2,PCO2:2,PO2:2,HCO3:2 in the last 72 hours  Studies/Results: No results found.  Anti-infectives: Anti-infectives    None      Assessment/Plan: s/p * No surgery found * Advanced to regular diet. Continue Reglan,simethicone and encourage ambulation. Hypercalcemia (resolved) Probable discharge today after lunchtime.     LOS: 3 days    Norman Piacentini 03/19/2012

## 2012-03-21 ENCOUNTER — Inpatient Hospital Stay (HOSPITAL_COMMUNITY)
Admission: EM | Admit: 2012-03-21 | Discharge: 2012-03-25 | DRG: 389 | Disposition: A | Discharge status: Home / Self Care | Payer: Medicaid Other | Attending: General Surgery | Admitting: General Surgery

## 2012-03-21 ENCOUNTER — Encounter (HOSPITAL_COMMUNITY): Payer: Self-pay | Admitting: Emergency Medicine

## 2012-03-21 ENCOUNTER — Emergency Department (HOSPITAL_COMMUNITY): Payer: Medicaid Other

## 2012-03-21 DIAGNOSIS — Z8541 Personal history of malignant neoplasm of cervix uteri: Secondary | ICD-10-CM

## 2012-03-21 DIAGNOSIS — Z933 Colostomy status: Secondary | ICD-10-CM

## 2012-03-21 DIAGNOSIS — I10 Essential (primary) hypertension: Secondary | ICD-10-CM | POA: Diagnosis present

## 2012-03-21 DIAGNOSIS — F1210 Cannabis abuse, uncomplicated: Secondary | ICD-10-CM | POA: Diagnosis present

## 2012-03-21 DIAGNOSIS — E871 Hypo-osmolality and hyponatremia: Secondary | ICD-10-CM | POA: Diagnosis not present

## 2012-03-21 DIAGNOSIS — D62 Acute posthemorrhagic anemia: Secondary | ICD-10-CM

## 2012-03-21 DIAGNOSIS — K56609 Unspecified intestinal obstruction, unspecified as to partial versus complete obstruction: Principal | ICD-10-CM | POA: Diagnosis present

## 2012-03-21 DIAGNOSIS — Z79899 Other long term (current) drug therapy: Secondary | ICD-10-CM

## 2012-03-21 HISTORY — DX: Anemia, unspecified: D64.9

## 2012-03-21 LAB — CBC WITH DIFFERENTIAL/PLATELET
Eosinophils Absolute: 0.1 10*3/uL (ref 0.0–0.7)
MCH: 31.5 pg (ref 26.0–34.0)
MCHC: 32.5 g/dL (ref 30.0–36.0)
Monocytes Absolute: 1.3 10*3/uL — ABNORMAL HIGH (ref 0.1–1.0)
Neutrophils Relative %: 72 % (ref 43–77)
Platelets: 519 10*3/uL — ABNORMAL HIGH (ref 150–400)
RDW: 18.4 % — ABNORMAL HIGH (ref 11.5–15.5)

## 2012-03-21 LAB — COMPREHENSIVE METABOLIC PANEL
AST: 18 U/L (ref 0–37)
Albumin: 2.6 g/dL — ABNORMAL LOW (ref 3.5–5.2)
CO2: 23 mEq/L (ref 19–32)
Calcium: 9.9 mg/dL (ref 8.4–10.5)
Creatinine, Ser: 0.54 mg/dL (ref 0.50–1.10)
GFR calc non Af Amer: >90 mL/min (ref >90)

## 2012-03-21 MED ORDER — WHITE PETROLATUM GEL
Status: AC
  Administered 2012-03-21: 16:00:00
  Filled 2012-03-21: qty 5

## 2012-03-21 MED ORDER — METOCLOPRAMIDE HCL 10 MG PO TABS
10.0000 mg | ORAL_TABLET | Freq: Three times a day (TID) | ORAL | Status: DC
  Administered 2012-03-21 – 2012-03-25 (×12): 10 mg via ORAL
  Filled 2012-03-21 (×16): qty 1

## 2012-03-21 MED ORDER — SODIUM CHLORIDE 0.9 % IV BOLUS (SEPSIS)
500.0000 mL | Freq: Once | INTRAVENOUS | Status: AC
  Administered 2012-03-21: 500 mL via INTRAVENOUS

## 2012-03-21 MED ORDER — OXYCODONE-ACETAMINOPHEN 5-325 MG PO TABS: 1.0000 | ORAL_TABLET | ORAL | Status: DC | PRN

## 2012-03-21 MED ORDER — HYDROMORPHONE HCL PF 1 MG/ML IJ SOLN
0.5000 mg | INTRAMUSCULAR | Status: DC | PRN
  Administered 2012-03-21 (×3): 2 mg via INTRAVENOUS
  Administered 2012-03-21: 1 mg via INTRAVENOUS
  Administered 2012-03-22 – 2012-03-23 (×13): 2 mg via INTRAVENOUS
  Filled 2012-03-21 (×17): qty 2

## 2012-03-21 MED ORDER — PROCHLORPERAZINE MALEATE 5 MG PO TABS: 5.0000 mg | ORAL_TABLET | Freq: Three times a day (TID) | ORAL | Status: DC

## 2012-03-21 MED ORDER — KCL IN DEXTROSE-NACL 20-5-0.45 MEQ/L-%-% IV SOLN
INTRAVENOUS | Status: DC
  Administered 2012-03-21 – 2012-03-22 (×4): via INTRAVENOUS
  Filled 2012-03-21 (×7): qty 1000

## 2012-03-21 MED ORDER — HEPARIN SODIUM (PORCINE) 5000 UNIT/ML IJ SOLN
5000.0000 [IU] | Freq: Three times a day (TID) | INTRAMUSCULAR | Status: DC
  Administered 2012-03-21 – 2012-03-24 (×8): 5000 [IU] via SUBCUTANEOUS
  Filled 2012-03-21 (×17): qty 1

## 2012-03-21 MED ORDER — HYDROMORPHONE HCL PF 1 MG/ML IJ SOLN
1.0000 mg | Freq: Once | INTRAMUSCULAR | Status: AC
  Administered 2012-03-21: 1 mg via INTRAVENOUS
  Filled 2012-03-21: qty 1

## 2012-03-21 MED ORDER — BIOTENE DRY MOUTH MT LIQD
15.0000 mL | Freq: Two times a day (BID) | OROMUCOSAL | Status: DC
  Administered 2012-03-21 – 2012-03-22 (×3): 15 mL via OROMUCOSAL

## 2012-03-21 MED ORDER — SIMETHICONE 40 MG/0.6ML PO SUSP
40.0000 mg | Freq: Four times a day (QID) | ORAL | Status: DC
  Administered 2012-03-21 – 2012-03-25 (×16): 40 mg via ORAL
  Filled 2012-03-21 (×20): qty 0.6

## 2012-03-21 MED ORDER — PROCHLORPERAZINE MALEATE 5 MG PO TABS
5.0000 mg | ORAL_TABLET | Freq: Three times a day (TID) | ORAL | Status: DC
  Administered 2012-03-21 – 2012-03-25 (×16): 5 mg via ORAL
  Filled 2012-03-21 (×23): qty 1

## 2012-03-21 MED ORDER — HYDROMORPHONE HCL PF 1 MG/ML IJ SOLN
1.0000 mg | Freq: Once | INTRAMUSCULAR | Status: AC
  Administered 2012-03-21: 1 mg via INTRAVENOUS

## 2012-03-21 MED ORDER — HYDROMORPHONE HCL PF 1 MG/ML IJ SOLN
0.5000 mg | INTRAMUSCULAR | Status: DC | PRN
  Administered 2012-03-21 (×2): 0.5 mg via INTRAVENOUS
  Filled 2012-03-21 (×2): qty 1

## 2012-03-21 MED ORDER — ONDANSETRON HCL 4 MG/2ML IJ SOLN
4.0000 mg | Freq: Once | INTRAMUSCULAR | Status: AC
  Administered 2012-03-21: 4 mg via INTRAVENOUS
  Filled 2012-03-21: qty 2

## 2012-03-21 MED ORDER — PANTOPRAZOLE SODIUM 40 MG PO TBEC
40.0000 mg | DELAYED_RELEASE_TABLET | Freq: Every day | ORAL | Status: DC
  Administered 2012-03-21 – 2012-03-24 (×4): 40 mg via ORAL
  Filled 2012-03-21 (×4): qty 1

## 2012-03-21 MED ORDER — CHLORHEXIDINE GLUCONATE 0.12 % MT SOLN
15.0000 mL | Freq: Two times a day (BID) | OROMUCOSAL | Status: DC
Start: 2012-03-21 — End: 2012-03-23
  Administered 2012-03-21 – 2012-03-22 (×3): 15 mL via OROMUCOSAL
  Filled 2012-03-21 (×2): qty 15

## 2012-03-21 MED ORDER — HYDROCHLOROTHIAZIDE 25 MG PO TABS: 12.5000 mg | ORAL_TABLET | Freq: Every day | ORAL | Status: DC

## 2012-03-21 MED ORDER — HYDROCHLOROTHIAZIDE 12.5 MG PO CAPS
12.5000 mg | ORAL_CAPSULE | Freq: Every day | ORAL | Status: DC
  Administered 2012-03-21 – 2012-03-25 (×5): 12.5 mg via ORAL
  Filled 2012-03-21 (×5): qty 1

## 2012-03-21 MED ORDER — FERROUS FUMARATE 325 (106 FE) MG PO TABS
1.0000 | ORAL_TABLET | Freq: Every day | ORAL | Status: DC
  Administered 2012-03-21 – 2012-03-25 (×5): 106 mg via ORAL
  Filled 2012-03-21 (×5): qty 1

## 2012-03-21 NOTE — ED Notes (Signed)
PT. REPORTS PERSISTENT VOMITTING WITH MID ABDOMINAL PAIN ONSET YESTERDAY . DENIES DIARRHEA OR FEVER .

## 2012-03-21 NOTE — ED Provider Notes (Signed)
History     CSN: [id number]  Arrival date & time 03/21/12  0602   First MD Initiated Contact with Patient 03/21/12 [phone]      Chief Complaint  Patient presents with  . Emesis    (Consider location/radiation/quality/duration/timing/severity/associated sxs/prior treatment) Patient is a 36 y.o. female presenting with vomiting. The history is provided by the patient and medical records.  Emesis     Patient who was recently d/c from hospital 4 days ago for SBO with hx of colostomy and colostomy revision s/p SBO presents to ER complaining of a 24 hx of of gradual onset of abdominal pain stating "yesterday my stomach started getting bloated again and how it's just hurting more and more" as well as complaining of vomiting x 2 and decreased colostomy output since yesterday. Patient states she called and spoke with the on call general surgeon for Dr. Luisa Hart and was instructed to come to ER. She denies fevers, chills, CP, SOB, aggravating or alleviating factors. Patient states symptoms are similar to past symptoms associated with SBO.   Past Medical History  Diagnosis Date  . Hypertension   . Cervical cancer ~ 2011  . History of blood transfusion     "I had ~ 7 in 01/2012"    Past Surgical History  Procedure Date  . Radiation implants ~ 2011    "for cervical cancer"  . Colonoscopy 01/30/2012    Procedure: COLONOSCOPY;  Surgeon: Beverley Fiedler, MD;  Location: [facility] ENDOSCOPY;  Service: Gastroenterology;  Laterality: N/A;  . Colonoscopy 01/30/2012    Procedure: COLONOSCOPY;  Surgeon: Beverley Fiedler, MD;  Location: [facility] OR;  Service: Gastroenterology;  Laterality: N/A;  . Colostomy revision 02/04/2012    Procedure: COLON RESECTION SIGMOID;  Surgeon: Cherylynn Ridges, MD;  Location: [facility] OR;  Service: General;  Laterality: N/A;  . Cholecystectomy 2006    No family history on file.  History  Substance Use Topics  . Smoking status: Former Smoker -- 0.5 packs/day for 10 years    Types: Cigarettes    Quit  date: 01/26/2012  . Smokeless tobacco: Never Used  . Alcohol Use: 0.0 oz/week     03/16/12 "haven't drank in ~ 2-3 years"    OB History    Grav Para Term Preterm Abortions TAB SAB Ect Mult Living   10 8              Review of Systems  Gastrointestinal: Positive for vomiting.  All other systems reviewed and are negative.    Allergies  Penicillins; Labetalol hcl; and Morphine and related  Home Medications   Current Outpatient Rx  Name Route Sig Dispense Refill  . METOCLOPRAMIDE HCL 10 MG PO TABS Oral Take 10 mg by mouth 3 (three) times daily before meals.    . OXYCODONE-ACETAMINOPHEN 5-325 MG PO TABS Oral Take 1-2 tablets by mouth every 4 (four) hours as needed. pain    . PANTOPRAZOLE SODIUM 40 MG PO TBEC Oral Take 1 tablet (40 mg total) by mouth daily at 12 noon. 30 tablet 3  . PROCHLORPERAZINE MALEATE 5 MG PO TABS Oral Take 5 mg by mouth 4 (four) times daily -  before meals and at bedtime. nausea    . SIMETHICONE 40 MG/0.6ML PO SUSP Oral Take 0.6 mLs (40 mg total) by mouth 4 (four) times daily. 30 mL 0  . PROCHLORPERAZINE MALEATE 5 MG PO TABS Oral Take 1 tablet (5 mg total) by mouth 4 (four) times daily -  with meals and  at bedtime. 90 tablet 3    BP 126/80  Pulse 130  Temp 97.4 F (36.3 C) (Oral)  Resp 22  SpO2 100%  Physical Exam  Nursing note and vitals reviewed. Constitutional: She is oriented to person, place, and time. She appears well-developed and well-nourished. No distress.  HENT:  Head: Normocephalic and atraumatic.  Eyes: Conjunctivae are normal.  Neck: Normal range of motion. Neck supple.  Cardiovascular: Regular rhythm, normal heart sounds and intact distal pulses.  Tachycardia present.  Exam reveals no gallop and no friction rub.   No murmur heard. Pulmonary/Chest: Effort normal and breath sounds normal. No respiratory distress. She has no wheezes. She has no rales. She exhibits no tenderness.  Abdominal: Bowel sounds are normal. She exhibits  distension. She exhibits no mass. There is tenderness. There is guarding. There is no rebound.       Diffuse TTP of entire abdomen with guarding and mild distension. No rigidity.   Scant amount of BM in colostomy bag. No surrounding erythema or drainage from colostomy bag.   Musculoskeletal: Normal range of motion. She exhibits no edema and no tenderness.  Neurological: She is alert and oriented to person, place, and time.  Skin: Skin is warm and dry. No rash noted. She is not diaphoretic. No erythema.  Psychiatric: She has a normal mood and affect.    ED Course  Procedures (including critical care time)  IV fluids, IV dilaudid and zofran.   I spoke with Dr. Janee Morn with general surgery who will see patient in ER.   Labs Reviewed  CBC WITH DIFFERENTIAL - Abnormal; Notable for the following:    WBC 11.9 (*)     RBC 3.81 (*)     RDW 18.4 (*)     Platelets 519 (*)     Neutro Abs 8.6 (*)     Monocytes Absolute 1.3 (*)     All other components within normal limits  COMPREHENSIVE METABOLIC PANEL - Abnormal; Notable for the following:    Sodium 134 (*)     Potassium 3.3 (*)     Albumin 2.6 (*)     Total Bilirubin 0.2 (*)     All other components within normal limits  LIPASE, BLOOD   Dg Abd Acute W/chest  03/21/2012  *RADIOLOGY REPORT*  Clinical Data: History of small bowel obstruction, vomiting, decreased stool output via colostomy  ACUTE ABDOMEN SERIES (ABDOMEN 2 VIEW & CHEST 1 VIEW)  Comparison: Abdominal radiographs dated 03/17/2012.  CT abdomen pelvis dated 03/16/2012.  Findings: Lungs are essentially clear.  No pleural effusion or pneumothorax.  The heart is normal in size.  Multiple dilated loops of small bowel in the mid abdomen, worrisome for small bowel obstruction.  No evidence of free air under the diaphragm on the upright view.  Surgical sutures in the right mid abdomen.  Known right lower quadrant colostomy.  Pelvic drain.  Cholecystectomy clips.  Two surgical clips  overlying the pelvis.  IMPRESSION: No evidence of acute cardiopulmonary disease.  Multiple dilated loops of small bowel in the mid abdomen, worrisome for small bowel obstruction.  No free air.  Left pelvic drain.  Original Report Authenticated By: Charline Bills, M.D.     1. SBO (small bowel obstruction)       MDM  Acute abdominal series worrisome for recurrence of SBO. General surgery to see patient in ER. assessment and plan discussed with Dr. Manus Gunning.         [place], [place] 03/21/12 [phone]

## 2012-03-21 NOTE — ED Provider Notes (Signed)
Medical screening examination/treatment/procedure(s) were conducted as a shared visit with non-physician practitioner(s) and myself.  I personally evaluated the patient during the encounter  D/c 4 days ago for SBO with increasin abdominal pain and vomiting. Tender diffusely with gas and stool in colostomy bag   Glynn Octave, MD 03/21/12 1727

## 2012-03-21 NOTE — ED Notes (Signed)
Lupita Leash, RN transporting pt to 5150.  Unable to move pt OTF d/t someone is in chart.

## 2012-03-21 NOTE — H&P (Signed)
Vanessa [name] is an 36 y.o. female.   Chief Complaint: N/V  HPI: 36 year old Female s/p sigmoid colon resection and diverting loop ileostomy who had a long, complicated hospital course with multiple abdominal abscesses due to anastomotic breakdown requiring drainage.Marland Kitchen She was discharged 6/21 in stable condition and presented to the hospital on 6/24 with abdominal pain, nausea and vomiting C/W PSBO.  She stayed in until 6/27 and had improved and was D/Cd then.  Yesterday, she had recurrent N/V and no output into her ileostomy so she returned to the ED.  Abdominal Xrays show SBO.  Feeling better now than during the night.   Past Medical History  Diagnosis Date  . Hypertension   . Cervical cancer ~ 2011  . History of blood transfusion     "I had ~ 7 in 01/2012"    Past Surgical History  Procedure Date  . Radiation implants ~ 2011    "for cervical cancer"  . Colonoscopy 01/30/2012    Procedure: COLONOSCOPY;  Surgeon: Beverley Fiedler, MD;  Location: [facility] ENDOSCOPY;  Service: Gastroenterology;  Laterality: N/A;  . Colonoscopy 01/30/2012    Procedure: COLONOSCOPY;  Surgeon: Beverley Fiedler, MD;  Location: [facility] OR;  Service: Gastroenterology;  Laterality: N/A;  . Colostomy revision 02/04/2012    Procedure: COLON RESECTION SIGMOID;  Surgeon: Cherylynn Ridges, MD;  Location: [facility] OR;  Service: General;  Laterality: N/A;  . Cholecystectomy 2006    No family history on file. Social History:  reports that she quit smoking about 7 weeks ago. Her smoking use included Cigarettes. She has a 5 pack-year smoking history. She has never used smokeless tobacco. She reports that she drinks alcohol. She reports that she uses illicit drugs (Marijuana).  Allergies:  Allergies  Allergen Reactions  . Penicillins Other (See Comments)    "anything w/penicillin in it makes me bleed"  . Labetalol Hcl Itching and Other (See Comments)    Bumps to bilateral arms.  . Morphine And Related Hives     (Not in a hospital  admission)  Results for orders placed during the hospital encounter of 03/21/12 (from the past 48 hour(s))  CBC WITH DIFFERENTIAL     Status: Abnormal   Collection Time   03/21/12  6:47 AM      Component Value Range Comment   WBC 11.9 (*) 4.0 - 10.5 K/uL    RBC 3.81 (*) 3.87 - 5.11 MIL/uL    Hemoglobin 12.0  12.0 - 15.0 g/dL    HCT 16.1  09.6 - 04.5 %    MCV 96.9  78.0 - 100.0 fL    MCH 31.5  26.0 - 34.0 pg    MCHC 32.5  30.0 - 36.0 g/dL    RDW 40.9 (*) 81.1 - 15.5 %    Platelets 519 (*) 150 - 400 K/uL    Neutrophils Relative 72  43 - 77 %    Lymphocytes Relative 16  12 - 46 %    Monocytes Relative 11  3 - 12 %    Eosinophils Relative 1  0 - 5 %    Basophils Relative 0  0 - 1 %    Neutro Abs 8.6 (*) 1.7 - 7.7 K/uL    Lymphs Abs 1.9  0.7 - 4.0 K/uL    Monocytes Absolute 1.3 (*) 0.1 - 1.0 K/uL    Eosinophils Absolute 0.1  0.0 - 0.7 K/uL    Basophils Absolute 0.0  0.0 - 0.1 K/uL  RBC Morphology POLYCHROMASIA PRESENT      Smear Review LARGE PLATELETS PRESENT     COMPREHENSIVE METABOLIC PANEL     Status: Abnormal   Collection Time   03/21/12  6:47 AM      Component Value Range Comment   Sodium 134 (*) 135 - 145 mEq/L    Potassium 3.3 (*) 3.5 - 5.1 mEq/L    Chloride 97  96 - 112 mEq/L    CO2 23  19 - 32 mEq/L    Glucose, Bld 97  70 - 99 mg/dL    BUN 8  6 - 23 mg/dL    Creatinine, Ser 1.61  0.50 - 1.10 mg/dL    Calcium 9.9  8.4 - 09.6 mg/dL    Total Protein 7.1  6.0 - 8.3 g/dL    Albumin 2.6 (*) 3.5 - 5.2 g/dL    AST 18  0 - 37 U/L    ALT 17  0 - 35 U/L    Alkaline Phosphatase 92  39 - 117 U/L    Total Bilirubin 0.2 (*) 0.3 - 1.2 mg/dL    GFR calc non Af Amer >90  >90 mL/min    GFR calc Af Amer >90  >90 mL/min   LIPASE, BLOOD     Status: Normal   Collection Time   03/21/12  6:47 AM      Component Value Range Comment   Lipase 16  11 - 59 U/L    Dg Abd Acute W/chest  03/21/2012  *RADIOLOGY REPORT*  Clinical Data: History of small bowel obstruction, vomiting, decreased  stool output via colostomy  ACUTE ABDOMEN SERIES (ABDOMEN 2 VIEW & CHEST 1 VIEW)  Comparison: Abdominal radiographs dated 03/17/2012.  CT abdomen pelvis dated 03/16/2012.  Findings: Lungs are essentially clear.  No pleural effusion or pneumothorax.  The heart is normal in size.  Multiple dilated loops of small bowel in the mid abdomen, worrisome for small bowel obstruction.  No evidence of free air under the diaphragm on the upright view.  Surgical sutures in the right mid abdomen.  Known right lower quadrant colostomy.  Pelvic drain.  Cholecystectomy clips.  Two surgical clips overlying the pelvis.  IMPRESSION: No evidence of acute cardiopulmonary disease.  Multiple dilated loops of small bowel in the mid abdomen, worrisome for small bowel obstruction.  No free air.  Left pelvic drain.  Original Report Authenticated By: Charline Bills, M.D.    Review of Systems  Constitutional: Positive for weight loss.  HENT: Negative.   Eyes: Negative.   Respiratory: Negative.   Cardiovascular: Negative.   Gastrointestinal: Positive for nausea, vomiting and abdominal pain. Negative for blood in stool.  Genitourinary: Negative for dysuria.  Musculoskeletal: Negative for back pain and joint pain.  Skin: Negative.   Neurological: Negative.     Blood pressure 123/84, pulse 97, temperature 97.8 F (36.6 C), temperature source Oral, resp. rate 18, SpO2 98.00%. Physical Exam  Constitutional: She is oriented to person, place, and time. She appears well-developed. No distress.  HENT:  Head: Normocephalic.  Eyes: Conjunctivae are normal. Pupils are equal, round, and reactive to light. No scleral icterus.  Neck: Normal range of motion. No tracheal deviation present.  Cardiovascular: Normal rate, regular rhythm, normal heart sounds and intact distal pulses.   No murmur heard. Respiratory: Effort normal and breath sounds normal. No stridor. No respiratory distress. She has no wheezes. She has no rales.  GI: Soft.  She exhibits distension. There is no tenderness. There is  no rebound and no guarding.       A few bowel sounds are present, ileostomy has a small amount of stool and air in the bag. Lower midline wound is less than a centimeter and clean with granulation tissue. Left lower quadrant drain continues with tan output  Musculoskeletal: Normal range of motion.  Neurological: She is alert and oriented to person, place, and time.  Skin: Skin is warm and dry. She is not diaphoretic.       See above     Assessment/Plan Patient with complicated history status post sigmoid resection and diverting ileostomy. Patient has anastomotic leak controlled by drain. She has recurrent partial small bowel obstruction. Plan: admit and resuscitate with IV fluids. Keep on bowel rest. Place nasogastric tube if recurrent vomiting. Follow closely. Do to the recurrent partial small bowel objections, operative intervention made be needed prior to discharge if she fails to improve. I discussed the plan in detail with the patient and her family member. I answered their questions.  Erie Sica E 03/21/2012, 8:45 AM

## 2012-03-22 LAB — CBC
HCT: 36 % (ref 36.0–46.0)
Hemoglobin: 11.5 g/dL — ABNORMAL LOW (ref 12.0–15.0)
MCV: 96.5 fL (ref 78.0–100.0)
RBC: 3.73 MIL/uL — ABNORMAL LOW (ref 3.87–5.11)
RDW: 18.1 % — ABNORMAL HIGH (ref 11.5–15.5)
WBC: 8.5 10*3/uL (ref 4.0–10.5)

## 2012-03-22 LAB — BASIC METABOLIC PANEL
CO2: 24 mEq/L (ref 19–32)
Calcium: 10.2 mg/dL (ref 8.4–10.5)
Chloride: 96 mEq/L (ref 96–112)
Glucose, Bld: 99 mg/dL (ref 70–99)
Potassium: 3.9 mEq/L (ref 3.5–5.1)
Sodium: 131 mEq/L — ABNORMAL LOW (ref 135–145)

## 2012-03-22 LAB — CULTURE, BLOOD (ROUTINE X 2)
Culture: NO GROWTH
Culture: NO GROWTH

## 2012-03-22 LAB — MAGNESIUM: Magnesium: 1.4 mg/dL — ABNORMAL LOW (ref 1.5–2.5)

## 2012-03-22 MED ORDER — ONDANSETRON HCL 4 MG/2ML IJ SOLN
INTRAMUSCULAR | Status: AC
  Filled 2012-03-22: qty 2

## 2012-03-22 MED ORDER — HYDROCERIN EX CREA
TOPICAL_CREAM | Freq: Two times a day (BID) | CUTANEOUS | Status: DC
  Administered 2012-03-22 – 2012-03-25 (×7): via TOPICAL
  Filled 2012-03-22: qty 113

## 2012-03-22 MED ORDER — ONDANSETRON HCL 4 MG/2ML IJ SOLN
4.0000 mg | Freq: Four times a day (QID) | INTRAMUSCULAR | Status: DC | PRN
  Administered 2012-03-22 – 2012-03-25 (×3): 4 mg via INTRAVENOUS
  Filled 2012-03-22 (×2): qty 2

## 2012-03-22 NOTE — Progress Notes (Signed)
Buttocks are extremely dry and flaky but there are no areas of pressure breakdown.  Will apply eucerin.  Ileostomy is productive, drainage is dark green in color, thin liquid.  Aromatherapy used to cut odor from drain, pt likes this.

## 2012-03-22 NOTE — Progress Notes (Signed)
  Subjective: Feeling better Ostomy now productive  Objective: Vital signs in last 24 hours: Temp:  [97.8 F (36.6 C)-98.3 F (36.8 C)] 97.8 F (36.6 C) (06/30 0981) Pulse Rate:  [88-101] 89  (06/30 0633) Resp:  [17-18] 17  (06/30 0633) BP: (118-134)/(82-96) 118/91 mmHg (06/30 0633) SpO2:  [98 %-100 %] 100 % (06/30 1914) Weight:  [143 lb 11.2 oz (65.182 kg)] 143 lb 11.2 oz (65.182 kg) (06/29 1351) Last BM Date: 03/22/12  Intake/Output from previous day: 06/29 0701 - 06/30 0700 In: 292 [I.V.:292] Out: 541 [Urine:1; Drains:40; Stool:500] Intake/Output this shift:    Abdomen soft, ostomy with liquid stool in bag.  Drain purulent Non tender  Lab Results:   Basename 03/21/12 0647  WBC 11.9*  HGB 12.0  HCT 36.9  PLT 519*   BMET  Basename 03/21/12 0647  NA 134*  K 3.3*  CL 97  CO2 23  GLUCOSE 97  BUN 8  CREATININE 0.54  CALCIUM 9.9   PT/INR No results found for this basename: LABPROT:2,INR:2 in the last 72 hours ABG No results found for this basename: PHART:2,PCO2:2,PO2:2,HCO3:2 in the last 72 hours  Studies/Results: Dg Abd Acute W/chest  03/21/2012  *RADIOLOGY REPORT*  Clinical Data: History of small bowel obstruction, vomiting, decreased stool output via colostomy  ACUTE ABDOMEN SERIES (ABDOMEN 2 VIEW & CHEST 1 VIEW)  Comparison: Abdominal radiographs dated 03/17/2012.  CT abdomen pelvis dated 03/16/2012.  Findings: Lungs are essentially clear.  No pleural effusion or pneumothorax.  The heart is normal in size.  Multiple dilated loops of small bowel in the mid abdomen, worrisome for small bowel obstruction.  No evidence of free air under the diaphragm on the upright view.  Surgical sutures in the right mid abdomen.  Known right lower quadrant colostomy.  Pelvic drain.  Cholecystectomy clips.  Two surgical clips overlying the pelvis.  IMPRESSION: No evidence of acute cardiopulmonary disease.  Multiple dilated loops of small bowel in the mid abdomen, worrisome for  small bowel obstruction.  No free air.  Left pelvic drain.  Original Report Authenticated By: Charline Bills, M.D.    Anti-infectives: Anti-infectives    None      Assessment/Plan: Ileus vs partial sbo  Continue iv rehydration Start po  LOS: 1 day    Chung Chagoya A 03/22/2012

## 2012-03-23 LAB — COMPREHENSIVE METABOLIC PANEL
Albumin: 2.1 g/dL — ABNORMAL LOW (ref 3.5–5.2)
Alkaline Phosphatase: 93 U/L (ref 39–117)
BUN: 5 mg/dL — ABNORMAL LOW (ref 6–23)
CO2: 21 mEq/L (ref 19–32)
Chloride: 100 mEq/L (ref 96–112)
GFR calc non Af Amer: >90 mL/min (ref >90)
Potassium: 4.3 mEq/L (ref 3.5–5.1)
Total Bilirubin: 0.2 mg/dL — ABNORMAL LOW (ref 0.3–1.2)

## 2012-03-23 LAB — MAGNESIUM: Magnesium: 1.4 mg/dL — ABNORMAL LOW (ref 1.5–2.5)

## 2012-03-23 MED ORDER — SODIUM CHLORIDE 1 G PO TABS
1.0000 g | ORAL_TABLET | Freq: Three times a day (TID) | ORAL | Status: DC
  Administered 2012-03-23 – 2012-03-25 (×7): 1 g via ORAL
  Filled 2012-03-23 (×10): qty 1

## 2012-03-23 MED ORDER — OXYCODONE HCL 5 MG PO TABS: 10.0000 mg | ORAL_TABLET | ORAL | Status: DC | PRN

## 2012-03-23 MED ORDER — POTASSIUM CHLORIDE IN NACL 20-0.9 MEQ/L-% IV SOLN
INTRAVENOUS | Status: DC
  Administered 2012-03-23 – 2012-03-25 (×6): via INTRAVENOUS
  Filled 2012-03-23 (×10): qty 1000

## 2012-03-23 MED ORDER — OXYCODONE HCL 5 MG PO TABS
10.0000 mg | ORAL_TABLET | ORAL | Status: DC | PRN
  Administered 2012-03-23: 10 mg via ORAL
  Administered 2012-03-23 – 2012-03-25 (×6): 20 mg via ORAL
  Administered 2012-03-25: 10 mg via ORAL
  Filled 2012-03-23: qty 4
  Filled 2012-03-23 (×3): qty 2
  Filled 2012-03-23 (×2): qty 4
  Filled 2012-03-23: qty 2
  Filled 2012-03-23 (×2): qty 4

## 2012-03-23 MED ORDER — ENSURE COMPLETE PO LIQD
237.0000 mL | Freq: Three times a day (TID) | ORAL | Status: DC
  Administered 2012-03-23 – 2012-03-25 (×2): 237 mL via ORAL

## 2012-03-23 MED ORDER — HYDROMORPHONE HCL PF 1 MG/ML IJ SOLN
1.0000 mg | INTRAMUSCULAR | Status: DC | PRN
  Administered 2012-03-23 – 2012-03-25 (×7): 1 mg via INTRAVENOUS
  Filled 2012-03-23 (×7): qty 1

## 2012-03-23 MED ORDER — OXYCODONE HCL 10 MG PO TB12
60.0000 mg | ORAL_TABLET | Freq: Two times a day (BID) | ORAL | Status: DC
  Administered 2012-03-23 – 2012-03-24 (×3): 60 mg via ORAL
  Filled 2012-03-23 (×3): qty 6

## 2012-03-23 NOTE — Care Management Note (Signed)
  Page 1 of 1   03/23/2012     11:15:42 AM   CARE MANAGEMENT NOTE 03/23/2012  Patient:  Vanessa [name], Vanessa [name]   Account Number:  [id number]  Date Initiated:  03/23/2012  Documentation initiated by:  Ronny Flurry  Subjective/Objective Assessment:   sigmoid colon resection and diverting loop ileostomy who had a long, complicated hospital course with multiple abdominal abscesses due to anastomotic breakdown requiring drainage..     Action/Plan:   Anticipated DC Date:     Anticipated DC Plan:  HOME W HOME HEALTH SERVICES         Choice offered to / List presented to:             Status of service:  In process, will continue to follow Medicare Important Message given?   (If response is "NO", the following Medicare IM given date fields will be blank) Date Medicare IM given:   Date Additional Medicare IM given:    Discharge Disposition:    Per UR Regulation:  Reviewed for med. necessity/level of care/duration of stay  If discussed at Long Length of Stay Meetings, dates discussed:    Comments:

## 2012-03-23 NOTE — Progress Notes (Signed)
Patient is much better tomorrow.  Will try different pain medication combination.  Eating well this morning.  Vanessa [name]. [name], MD, FACS [phone] [facility] Surgery

## 2012-03-23 NOTE — Consult Note (Signed)
WOC ostomy consult  Stoma type/location: RLQ double ileostomy, with high output Stomal assessment/size: oval, pink and moist, 1 5/8" oval Peristomal assessment: intact but moist today due to leakage Treatment options for stomal/peristomal skin: none needed, however I will place her in a smaller pouch due to flange on current pouch too large at groin Output high output liquid green Ostomy pouching:2pc. 2 3/4" placed but will switch over to 2 1/4" to try to fit better with skin fold in groin.  She prefers 2pc to 1pc however 1pc may work better due to location of low stoma Education provided: reviewed cutting of wafter, pt has been home with [facility] for assist and previous WOC notes that pt was independent with care.    WOC will follow along for management and assistance with ostomy as needed.

## 2012-03-23 NOTE — Progress Notes (Signed)
  Subjective: Doing ok this am, illeostomy "putting out a lot"  No c/o NV or bloating.  Feels like her home pain meds are inadequate, " lasts only a couple of hours" wants to eat more solid food today.  Objective: Vital signs in last 24 hours: Temp:  [97.9 F (36.6 C)-98.3 F (36.8 C)] 98 F (36.7 C) (07/01 0515) Pulse Rate:  [87-92] 87  (07/01 0515) Resp:  [18] 18  (07/01 0515) BP: (121-140)/(88-102) 125/90 mmHg (07/01 0515) SpO2:  [100 %] 100 % (07/01 0515) Last BM Date: 03/22/12  Intake/Output from previous day: 06/30 0701 - 07/01 0700 In: 3800 [P.O.:800; I.V.:3000] Out: 2585 [Emesis/NG output:750; Drains:35; Stool:1800] Intake/Output this shift:    General appearance: alert, cooperative and no distress Abdomen is soft, non tender, illeostomy is putting out thin brownish drainage,(1800 ml in past 24 hrs) + gas, left abdominal drain has minimal straw colored drainage at present. (35 ml in past 24 hrs) Chest CTA Cardiac: RRR, no murmurs rubs or gallops, denies any chest pain.  Lab Results:   Basename 03/22/12 1200 03/21/12 0647  WBC 8.5 11.9*  HGB 11.5* 12.0  HCT 36.0 36.9  PLT 451* 519*   BMET  Basename 03/22/12 1200 03/21/12 0647  NA 131* 134*  K 3.9 3.3*  CL 96 97  CO2 24 23  GLUCOSE 99 97  BUN 7 8  CREATININE 0.50 0.54  CALCIUM 10.2 9.9   PT/INR No results found for this basename: LABPROT:2,INR:2 in the last 72 hours ABG No results found for this basename: PHART:2,PCO2:2,PO2:2,HCO3:2 in the last 72 hours  Studies/Results: No results found.  Anti-infectives: Anti-infectives    None      Assessment/Plan: s/p * No surgery found * Hyponatremia Hypomagnesia Change IVF to NS with KCL, add 1 g Na+ tabs tid Recheck CMP now for mag (1.4) replete as needed. BMP in am for Na+. Advance diet Will discuss pain management issues with Dr. [name]. ? add gabapentin to pain regimen/change to fentanyl patch.  LOS: 2 days    Vanessa [name] 03/23/2012

## 2012-03-23 NOTE — Progress Notes (Signed)
INITIAL ADULT NUTRITION ASSESSMENT Date: 03/23/2012   Time: 1:54 PM Reason for Assessment: nutrition risk; wt loss  ASSESSMENT: Female 36 y.o.  Dx: increased ileostomy output  Hx:  Past Medical History  Diagnosis Date  . Hypertension   . Cervical cancer ~ 2011  . History of blood transfusion     "I had ~ 7 in 01/2012"  . Anemia    Past Surgical History  Procedure Date  . Radiation implants ~ 2011    "for cervical cancer"  . Colonoscopy 01/30/2012    Procedure: COLONOSCOPY;  Surgeon: Beverley Fiedler, MD;  Location: [facility] ENDOSCOPY;  Service: Gastroenterology;  Laterality: N/A;  . Colonoscopy 01/30/2012    Procedure: COLONOSCOPY;  Surgeon: Beverley Fiedler, MD;  Location: [facility] OR;  Service: Gastroenterology;  Laterality: N/A;  . Colostomy revision 02/04/2012    Procedure: COLON RESECTION SIGMOID;  Surgeon: Cherylynn Ridges, MD;  Location: [facility] OR;  Service: General;  Laterality: N/A;  . Cholecystectomy 2006    Related Meds:  Scheduled Meds:    . antiseptic oral rinse  15 mL Mouth Rinse q12n4p  . chlorhexidine  15 mL Mouth Rinse BID  . ferrous fumarate  1 tablet Oral Daily  . heparin  5,000 Units Subcutaneous Q8H  . hydrocerin   Topical BID  . hydrochlorothiazide  12.5 mg Oral Daily  . metoCLOPramide  10 mg Oral TID AC  . ondansetron      . pantoprazole  40 mg Oral Q1200  . prochlorperazine  5 mg Oral TID AC & HS  . simethicone  40 mg Oral QID  . sodium chloride  1 g Oral TID WC   Continuous Infusions:    . 0.9 % NaCl with KCl 20 mEq / L    . DISCONTD: dextrose 5 % and 0.45 % NaCl with KCl 20 mEq/L 125 mL/hr at 03/22/12 2003   PRN Meds:.HYDROmorphone (DILAUDID) injection, ondansetron, oxyCODONE-acetaminophen   Ht: 5\' 4"  (162.6 cm) (5'4") Wt: 143 lb 11.2 oz (65.182 kg)  Ideal Wt: 54.5 kg % Ideal Wt: 116%  Usual Wt: 158-161 lbs % Usual Wt: 90.9%  Body mass index is 24.67 kg/(m^2).  Food/Nutrition Related Hx: Pt with recent admission for nausea, vomiting, abdominal pain found to  have anastomotic leak, ileus, stricture, thrombocytopenia with slow diet advance and decreased tolerance leading to parenteral nutrition support for several weeks.  Pt was able to wean from TPN and resume a PO diet with tolerance prior to d/c. Pt readmitted last week for nausea/vomiting, readmitted for increased ileostomy output  Labs:  CMP     Component Value Date/Time   NA 131* 03/23/2012 1100   K 4.3 03/23/2012 1100   CL 100 03/23/2012 1100   CO2 21 03/23/2012 1100   GLUCOSE 108* 03/23/2012 1100   BUN 5* 03/23/2012 1100   CREATININE 0.49* 03/23/2012 1100   CALCIUM 9.7 03/23/2012 1100   CALCIUM 10.7* 02/01/2012 0705   PROT 6.2 03/23/2012 1100   ALBUMIN 2.1* 03/23/2012 1100   AST 28 03/23/2012 1100   ALT 21 03/23/2012 1100   ALKPHOS 93 03/23/2012 1100   BILITOT 0.2* 03/23/2012 1100   GFRNONAA >90 03/23/2012 1100   GFRAA >90 03/23/2012 1100    CBC    Component Value Date/Time   WBC 8.5 03/22/2012 1200   WBC 5.3 10/22/2010 1258   RBC 3.73* 03/22/2012 1200   RBC 3.48* 10/22/2010 1258   HGB 11.5* 03/22/2012 1200   HGB 11.2* 10/22/2010 1258   HCT 36.0 03/22/2012  1200   HCT 33.1* 10/22/2010 1258   PLT 451* 03/22/2012 1200   PLT 287 10/22/2010 1258   MCV 96.5 03/22/2012 1200   MCV 94.9 10/22/2010 1258   MCH 30.8 03/22/2012 1200   MCH 29.1 09/19/2010 0847   MCHC 31.9 03/22/2012 1200   MCHC 33.8 10/22/2010 1258   RDW 18.1* 03/22/2012 1200   RDW 25.0* 10/22/2010 1258   LYMPHSABS 1.9 03/21/2012 0647   LYMPHSABS 0.5* 10/22/2010 1258   MONOABS 1.3* 03/21/2012 0647   MONOABS 0.6 10/22/2010 1258   EOSABS 0.1 03/21/2012 0647   EOSABS 0.1 10/22/2010 1258   BASOSABS 0.0 03/21/2012 0647   BASOSABS 0.0 10/22/2010 1258   Intake: NPO Output:   Intake/Output Summary (Last 24 hours) at 03/23/12 1354 Last data filed at 03/23/12 1335  Gross per 24 hour  Intake   3480 ml  Output   2185 ml  Net   1295 ml   1800 mL output yesterday  Diet Order: Dysphagia 3, thin (mechanical soft)  Supplements/Tube Feeding: none at this time  IVF:       0.9 % NaCl with KCl 20 mEq / L   DISCONTD: dextrose 5 % and 0.45 % NaCl with KCl 20 mEq/L Last Rate: 125 mL/hr at 03/22/12 2003    Estimated Nutritional Needs:   Kcal: 1950-2100 kcal Protein: 84-97g Fluid: ~2.2 L/day  Pt admitted with increased ileostomy output.  Pt received education during previous admission for nutrition therapy for new ileostomy, however pt requests additional education and presents with several questions.  Pt has been attempting to choose the best foods for her 'sensitive stomach' however does not feel she has achieved this goal.    Pt dx with severe malnutrition of chronic disease due to significant wt loss and intake estimated to be <75% of needs per food recall.  Pt remains malnourished, however has made improvement in intake.   Pt did require TPN during recent admission to meet needs due to prolonged NPO status.  TPN regimen during previous admission met 93% estimated kcal needs, and 100% protein needs.  NUTRITION DIAGNOSIS: -Altered GI function (NI-1.4).  Status: Ongoing  RELATED TO: complicated anatomy, s/p several surgeries, s/p cervical cancer  AS EVIDENCE BY: increased ileostomy output, dehydration  MONITORING/EVALUATION(Goals): 1.  Food/Beverage; pt consuming >75% of meals, and at least 60-80 oz of fluid daily.   EDUCATION NEEDS: -Education needs addressed with pt re: ileostomy nutrition therapy  INTERVENTION: 1.  General healthful diet; encouraged intake of food and beverages 2.  Brief education; discussed goals of nutrition therapy r/t ileostomy and ways to achieve with pt. Provided with blue booklet "Colostomy, ileostomy, rectal pouch diet." Pt also with questions regarding wt gain after significant recent wt loss and improvement in nutrition status.  Questions are answered, discussed high kcal and high protein intake and ways to achieve.  Pt verbalizes understanding of information presented.  Expect good compliance.  RD contact info provided on  pt handout. 3.  Supplements; Ensure Complete with vanilla ice cream BID  Dietitian #: [phone]  DOCUMENTATION CODES Per approved criteria  -Severe malnutrition in the context of chronic illness    Hoyt Koch 03/23/2012, 1:54 PM

## 2012-03-24 NOTE — Progress Notes (Signed)
Patient ID: Vanessa [name], female   DOB: 1976/04/15, 36 y.o.   MRN: [id number]    Subjective: Patient states she is a lot of pain. Described as constant LLQ pressure that is only relieved with Dilaudid. Nurse states that the patient has not been moaning at all, has been sleeping most of the day, and did not eat well today because she was too tired. Patient is also refusing to get out of bed. Patient states she is "in no rush to get out of here." The smell of the drainage makes her sick to her stomach. Tolerating diet well with no nausea or vomiting.   Objective: Vital signs in last 24 hours: Temp:  [98.1 F (36.7 C)] 98.1 F (36.7 C) (07/02 8657) Pulse Rate:  [97-98] 97  (07/02 0633) Resp:  [18] 18  (07/02 8469) BP: (125-128)/(87-89) 125/87 mmHg (07/02 0633) SpO2:  [100 %] 100 % (07/02 6295) Last BM Date: 03/23/12  Intake/Output from previous day: 07/01 0701 - 07/02 0700 In: 2520 [P.O.:480; I.V.:2040] Out: 740 [Drains:40; Stool:700] Intake/Output this shift:   PE: General: appears very tired, no acute distress Lungs: clear to auscultation Cardiac: regular rate and rhythm Abdomen is soft, + BS, TTP LLQ near drain site, no guarding or rebound tenderness, illeostomy emptied just prior to exam, left abdominal drain has moderate amount of greenish/straw colored drainage at present with foul odor   Lab Results:   Basename 03/22/12 1200  WBC 8.5  HGB 11.5*  HCT 36.0  PLT 451*   BMET  Basename 03/23/12 1100 03/22/12 1200  NA 131* 131*  K 4.3 3.9  CL 100 96  CO2 21 24  GLUCOSE 108* 99  BUN 5* 7  CREATININE 0.49* 0.50  CALCIUM 9.7 10.2   PT/INR No results found for this basename: LABPROT:2,INR:2 in the last 72 hours ABG No results found for this basename: PHART:2,PCO2:2,PO2:2,HCO3:2 in the last 72 hours  Studies/Results: No results found.  Anti-infectives: Anti-infectives    None      Assessment/Plan: Hyponatremia -- continue IVF and Na+ tabs Hypomagnesia --  1.4, consider Mg supplementation S/p left/sigmoid colectomy with anastomotic leak (01/27/2012) -- Advance diet, will discuss pain management issues with Dr. [name], continue ostomy and wound care   LOS: 3 days    Vanessa Hornback PA-S 03/24/2012 11:42 AM

## 2012-03-24 NOTE — Progress Notes (Signed)
Saw patient and left drain output is milky green and stinky.  Try to get home again soon.  Advance diet. Marta Lamas. [name], MD, FACS [phone] [facility] Surgery

## 2012-03-25 MED ORDER — OXYCODONE HCL 10 MG PO TABS: 10.0000 mg | ORAL_TABLET | ORAL | Status: DC | PRN

## 2012-03-25 MED ORDER — HYDROMORPHONE HCL PF 1 MG/ML IJ SOLN
1.0000 mg | Freq: Once | INTRAMUSCULAR | Status: AC
  Administered 2012-03-25: 1 mg via INTRAVENOUS
  Filled 2012-03-25: qty 1

## 2012-03-25 NOTE — Discharge Summary (Signed)
Physician Discharge Summary  Patient ID: Vanessa [name] MRN: [id number] DOB/AGE: 1976-06-19 36 y.o.  Admit date: 03/21/2012 Discharge date: 03/25/2012  Admission Diagnoses: Abdominal pain (most likely related to inadequate pain control)  Discharge Diagnoses: Small bowel obstruction (resolved)   Discharged Condition: stable  Hospital Course: 36 year old Female s/p sigmoid colon resection and diverting loop ileostomy who has had a long, complicated hospital course with multiple abdominal abscesses due to anastomotic breakdown requiring drainage and JP tubes. She was discharged 6/21 in stable condition and presented to the hospital on 6/24 with abdominal pain, nausea and vomiting, admitted for r/o of a small bowel obstruction vs. Ileus. She was again admitted on 03/21/12 with similar complaint. Her symptoms have resolved rapidly each time with bowel rest, longer acting narcotic medications and prn dosing for breakthrough pain, and slow return to a diet. At this time she is again stable for discharge, we have attempted in the past without success to send her home on longer acting pain medication, along with prn medications for breakthrough pain; but these requests( for longer acting narcotics) have been denied by her insurance carrier. We will attempt to get authorization again with this discharge as well as referring her to an outpatient pain management clinic, for her chronic pain issues.  Consults: None  Significant Diagnostic Studies: labs,radiology,microbiology.  Treatments: IV hydration, analgesia, and NPO.  Discharge Exam: Blood pressure 129/92, pulse 99, temperature 97.9 F (36.6 C), temperature source Oral, resp. rate 20, height 5\' 4"  (1.626 m), weight 143 lb 11.2 oz (65.182 kg), SpO2 100.00%. General appearance: alert, cooperative, appears stated age and no distress abdominal drain draining tanish, thin liquid; remains consistent with previously noted drainage.  ileostomy draining  brownish fluid, abdomen otherwise soft, non tender, no bloating, positive BS.   Disposition: Home -self care, follow up with our office as previously scheduled. Discharge Orders    Future Appointments: Provider: Department: Dept Phone: Center:   04/17/2012 2:15 PM Cherylynn Ridges, MD [facility] [phone] None   04/23/2012 11:00 AM Oneita Hurt, MD [facility] [phone] None     Medication List  As of 03/25/2012 10:19 AM   ASK your doctor about these medications         ferrous fumarate 325 (106 FE) MG Tabs   Commonly known as: HEMOCYTE - 106 mg FE   Take 1 tablet (106 mg of iron total) by mouth daily.      hydrochlorothiazide 12.5 MG tablet   Commonly known as: HYDRODIURIL   Take 12.5 mg by mouth daily.      metoCLOPramide 10 MG tablet   Commonly known as: REGLAN   Take 10 mg by mouth 3 (three) times daily before meals.      oxyCODONE-acetaminophen 5-325 MG per tablet   Commonly known as: PERCOCET   Take 1-2 tablets by mouth every 4 (four) hours as needed. pain      pantoprazole 40 MG tablet   Commonly known as: PROTONIX   Take 1 tablet (40 mg total) by mouth daily at 12 noon.      prochlorperazine 5 MG tablet   Commonly known as: COMPAZINE   Take 1 tablet (5 mg total) by mouth 4 (four) times daily -  with meals and at bedtime.      prochlorperazine 5 MG tablet   Commonly known as: COMPAZINE   Take 5 mg by mouth 4 (four) times daily -  before meals and at bedtime. nausea  simethicone 40 MG/0.6ML drops   Commonly known as: MYLICON   Take 0.6 mLs (40 mg total) by mouth 4 (four) times daily.             SignedBlenda Mounts 03/25/2012, 10:19 AM

## 2012-03-25 NOTE — Progress Notes (Signed)
Patient discharged to home with instructions and verbalized understanding, escorted by family member.

## 2012-03-25 NOTE — Consult Note (Signed)
WOC ostomy consult  Stoma type/location:  RLQ, loop ileostomy Stomal assessment/size: 1" x 1 5/8" oval Peristomal assessment: skin was denuded when nurse changed pouch early am yesterday due to previous pouch leakage. Since change to smaller wafer, seal intact and peristomal skin should improve Output liquid, high output Ostomy pouching:  2pc. 2 1/4" in place with good seal.  I have ordered 1pc flat and barrier rings due to the location of the stoma discussed with pt that we may need to go back to this option if leakage is an issue.  She would like to stay in 2pc if possible.    WOC will follow along with you for ostomy management and assistance as needed. Think pt may need HHRN again at dc to continue with teaching of ostomy care.  Damiano Stamper [place], [place] [phone]

## 2012-03-28 ENCOUNTER — Encounter (HOSPITAL_COMMUNITY): Payer: Self-pay | Admitting: *Deleted

## 2012-03-28 ENCOUNTER — Emergency Department (HOSPITAL_COMMUNITY)
Admission: EM | Admit: 2012-03-28 | Discharge: 2012-03-28 | Discharge status: Against Medical Advice | Payer: Medicaid Other | Attending: Emergency Medicine | Admitting: Emergency Medicine

## 2012-03-28 DIAGNOSIS — R112 Nausea with vomiting, unspecified: Secondary | ICD-10-CM | POA: Insufficient documentation

## 2012-03-28 DIAGNOSIS — Z933 Colostomy status: Secondary | ICD-10-CM | POA: Insufficient documentation

## 2012-03-28 NOTE — ED Notes (Signed)
Pt called in triage waiting room, general waiting room with no answer. Will attempted to call again.

## 2012-03-28 NOTE — ED Notes (Signed)
Called x3 in general waiting room, triage waiting, and restrooms in triage and waiting.  No answer.  Patient left AMA without notifying staff.

## 2012-03-28 NOTE — ED Notes (Signed)
Called for pt in waiting room. 

## 2012-03-28 NOTE — ED Notes (Signed)
Attempted to call pt from triage and general waiting room with no response. 

## 2012-03-28 NOTE — ED Notes (Signed)
Patient was d/c from hospital on July 3rd and now present to ED with nausea vomiting.  Patient has colostomy and wound drain.  Patient also has been sweating home

## 2012-03-28 NOTE — ED Notes (Signed)
Spoke with Dr. [name], pt to see EDP and be worked up in ED.

## 2012-04-01 ENCOUNTER — Encounter (HOSPITAL_COMMUNITY): Payer: Self-pay | Admitting: *Deleted

## 2012-04-01 ENCOUNTER — Emergency Department (HOSPITAL_COMMUNITY)
Admission: EM | Admit: 2012-04-01 | Discharge: 2012-04-02 | Disposition: A | Discharge status: Home / Self Care | Payer: Medicaid Other | Attending: Emergency Medicine | Admitting: Emergency Medicine

## 2012-04-01 ENCOUNTER — Emergency Department (HOSPITAL_COMMUNITY): Payer: Medicaid Other

## 2012-04-01 DIAGNOSIS — Z87891 Personal history of nicotine dependence: Secondary | ICD-10-CM | POA: Insufficient documentation

## 2012-04-01 DIAGNOSIS — R109 Unspecified abdominal pain: Secondary | ICD-10-CM | POA: Insufficient documentation

## 2012-04-01 DIAGNOSIS — I10 Essential (primary) hypertension: Secondary | ICD-10-CM | POA: Insufficient documentation

## 2012-04-01 DIAGNOSIS — Z8541 Personal history of malignant neoplasm of cervix uteri: Secondary | ICD-10-CM | POA: Insufficient documentation

## 2012-04-01 DIAGNOSIS — Z933 Colostomy status: Secondary | ICD-10-CM | POA: Insufficient documentation

## 2012-04-01 DIAGNOSIS — R1110 Vomiting, unspecified: Secondary | ICD-10-CM | POA: Insufficient documentation

## 2012-04-01 LAB — CBC
HCT: 45.6 % (ref 36.0–46.0)
MCV: 92.7 fL (ref 78.0–100.0)
RDW: 16.9 % — ABNORMAL HIGH (ref 11.5–15.5)
WBC: 12.7 10*3/uL — ABNORMAL HIGH (ref 4.0–10.5)

## 2012-04-01 LAB — URINALYSIS, ROUTINE W REFLEX MICROSCOPIC
Nitrite: NEGATIVE
Protein, ur: 30 mg/dL — AB
Urobilinogen, UA: 1 mg/dL (ref 0.0–1.0)

## 2012-04-01 LAB — URINE MICROSCOPIC-ADD ON

## 2012-04-01 LAB — COMPREHENSIVE METABOLIC PANEL
Albumin: 2.9 g/dL — ABNORMAL LOW (ref 3.5–5.2)
BUN: 7 mg/dL (ref 6–23)
CO2: 27 mEq/L (ref 19–32)
Chloride: 95 mEq/L — ABNORMAL LOW (ref 96–112)
Creatinine, Ser: 0.54 mg/dL (ref 0.50–1.10)
GFR calc non Af Amer: >90 mL/min (ref >90)
Total Bilirubin: 0.3 mg/dL (ref 0.3–1.2)

## 2012-04-01 MED ORDER — HYDROMORPHONE HCL PF 1 MG/ML IJ SOLN
1.0000 mg | Freq: Once | INTRAMUSCULAR | Status: AC
  Administered 2012-04-01: 1 mg via INTRAVENOUS
  Filled 2012-04-01: qty 1

## 2012-04-01 MED ORDER — ONDANSETRON 4 MG PO TBDP
8.0000 mg | ORAL_TABLET | Freq: Once | ORAL | Status: AC
  Administered 2012-04-01: 8 mg via ORAL
  Filled 2012-04-01: qty 2

## 2012-04-01 MED ORDER — ONDANSETRON 4 MG PO TBDP
ORAL_TABLET | ORAL | Status: AC
  Filled 2012-04-01: qty 2

## 2012-04-01 MED ORDER — ONDANSETRON 4 MG PO TBDP
8.0000 mg | ORAL_TABLET | Freq: Once | ORAL | Status: AC
  Administered 2012-04-01: 8 mg via ORAL

## 2012-04-01 MED ORDER — SODIUM CHLORIDE 0.9 % IV BOLUS (SEPSIS)
1000.0000 mL | Freq: Once | INTRAVENOUS | Status: AC
  Administered 2012-04-01: 1000 mL via INTRAVENOUS

## 2012-04-01 NOTE — ED Provider Notes (Signed)
History     CSN: [id number]  Arrival date & time 04/01/12  1408   None     Chief Complaint  Patient presents with  . Abdominal Pain  . Emesis    (Consider location/radiation/quality/duration/timing/severity/associated sxs/prior treatment) Patient is a 36 y.o. female presenting with abdominal pain. The history is provided by the patient.  Abdominal Pain The primary symptoms of the illness include abdominal pain, nausea and vomiting. The primary symptoms of the illness do not include fever. The current episode started 6 to 12 hours ago.      36 y/o female appears in pain and diaphoretic c/o severe diffuse abdominal pain and 2x episodes of NBNB emesis to day. Pt denies fever/chills. Pt has colostomy spaced secondary to SBO several months ago. Cervical Ca in remission. [name] is her Careers adviser. Pt states that she is out of all her Pian meds x4 hours. She takes Percocet 10/325 q3h.     Past Medical History  Diagnosis Date  . Hypertension   . Cervical cancer ~ 2011  . History of blood transfusion     "I had ~ 7 in 01/2012"  . Anemia     Past Surgical History  Procedure Date  . Radiation implants ~ 2011    "for cervical cancer"  . Colonoscopy 01/30/2012    Procedure: COLONOSCOPY;  Surgeon: Beverley Fiedler, MD;  Location: [facility] ENDOSCOPY;  Service: Gastroenterology;  Laterality: N/A;  . Colonoscopy 01/30/2012    Procedure: COLONOSCOPY;  Surgeon: Beverley Fiedler, MD;  Location: [facility] OR;  Service: Gastroenterology;  Laterality: N/A;  . Colostomy revision 02/04/2012    Procedure: COLON RESECTION SIGMOID;  Surgeon: Cherylynn Ridges, MD;  Location: [facility] OR;  Service: General;  Laterality: N/A;  . Cholecystectomy 2006    History reviewed. No pertinent family history.  History  Substance Use Topics  . Smoking status: Former Smoker -- 0.5 packs/day for 10 years    Types: Cigarettes    Quit date: 01/26/2012  . Smokeless tobacco: Never Used  . Alcohol Use: 0.0 oz/week     03/16/12 "haven't drank in ~  2-3 years"    OB History    Grav Para Term Preterm Abortions TAB SAB Ect Mult Living   10 8              Review of Systems  Constitutional: Negative for fever.  Gastrointestinal: Positive for nausea, vomiting and abdominal pain.    Allergies  Penicillins; Labetalol hcl; and Morphine and related  Home Medications   Current Outpatient Rx  Name Route Sig Dispense Refill  . METOCLOPRAMIDE HCL 10 MG PO TABS Oral Take 10 mg by mouth 3 (three) times daily before meals.    Marland Kitchen PANTOPRAZOLE SODIUM 40 MG PO TBEC Oral Take 1 tablet (40 mg total) by mouth daily at 12 noon. 30 tablet 3  . PROCHLORPERAZINE MALEATE 5 MG PO TABS Oral Take 5 mg by mouth 4 (four) times daily -  before meals and at bedtime.       BP 138/109  Pulse 99  Temp 97.8 F (36.6 C) (Oral)  Resp 16  SpO2 100%  Physical Exam  Vitals reviewed. Constitutional: She is oriented to person, place, and time. She appears well-developed and well-nourished. She appears distressed.       Pt is distressed and diaphoretic  HENT:  Head: Normocephalic and atraumatic.       Mucus membranes dry   Eyes: Conjunctivae and EOM are normal. Pupils are equal, round, and reactive  to light.  Neck: Normal range of motion. Neck supple.  Cardiovascular: Normal rate.   Pulmonary/Chest: Effort normal and breath sounds normal. No respiratory distress. She has no wheezes. She has no rales.  Abdominal: Soft. Bowel sounds are normal. She exhibits no distension. There is tenderness. There is no rebound and no guarding.       Colostomy bag with stool. Diffusely tender. +Bowel sounds  Musculoskeletal: Normal range of motion.  Neurological: She is alert and oriented to person, place, and time.  Skin:       Flaking   Psychiatric: She has a normal mood and affect.    ED Course  Procedures (including critical care time)  Labs Reviewed  CBC - Abnormal; Notable for the following:    WBC 12.7 (*)     Hemoglobin 15.3 (*)     RDW 16.9 (*)     All  other components within normal limits  COMPREHENSIVE METABOLIC PANEL - Abnormal; Notable for the following:    Sodium 134 (*)     Chloride 95 (*)     Albumin 2.9 (*)     All other components within normal limits  URINALYSIS, ROUTINE W REFLEX MICROSCOPIC - Abnormal; Notable for the following:    Color, Urine ORANGE (*)  BIOCHEMICALS MAY BE AFFECTED BY COLOR   APPearance CLOUDY (*)     Specific Gravity, Urine 1.038 (*)     Bilirubin Urine MODERATE (*)     Ketones, ur 15 (*)     Protein, ur 30 (*)     Leukocytes, UA SMALL (*)     All other components within normal limits  URINE MICROSCOPIC-ADD ON - Abnormal; Notable for the following:    Squamous Epithelial / LPF FEW (*)     Crystals CA OXALATE CRYSTALS (*)     All other components within normal limits   Dg Abd Acute W/chest  04/02/2012  *RADIOLOGY REPORT*  Clinical Data: Abdominal pain; history of bowel obstruction.  ACUTE ABDOMEN SERIES (ABDOMEN 2 VIEW & CHEST 1 VIEW)  Comparison: Chest abdominal radiographs performed 03/21/2012  Findings: The lungs are well-aerated and clear.  There is no evidence of focal opacification, pleural effusion or pneumothorax. The cardiomediastinal silhouette is within normal limits.  The visualized bowel gas pattern is nonspecific.  Scattered air- fluid levels are noted within the small and large bowel, without definite evidence of bowel obstruction.  No free intra-abdominal air is identified on the provided upright view.  No acute osseous abnormalities are seen; the sacroiliac joints are unremarkable in appearance.  Clips are noted within the right upper quadrant, reflecting prior cholecystectomy.  A drainage catheter is noted at the left hemipelvis.  IMPRESSION:  1.  Nonspecific bowel gas pattern, with persistent air-fluid levels in the small and large bowel, but no evidence for significant bowel obstruction.  This is improved in appearance from the prior study. No free intra-abdominal air seen. 2.  No acute  cardiopulmonary process identified.  Original Report Authenticated By: Tonia Ghent, M.D.     1. Abdominal  pain, other specified site       MDM  36 y/o female with cervical Ca in remission s/p SBO with colostomy placement c/o abdominal pain. Pt is afebrile and greatly improved with pain Rx. Serial abdominal exams show diffuse tenderness. With pain significant improvement in pain with IV dilaudid and hydration. No vomiting while in the ED. Acute abdominal series shows mildly distended bowel loops with air fluid levels improved from prior. Dr. Judd Lien  spoke with Dr. [name] who advised d/c with outpatient follow up. Shared visit with attending DR. Delo who examined the pt. Discussed case with attending who agrees with plan and stability to d/c to home.  Pt verbalized understanding and agrees with care plan. Outpatient follow-up and return precautions given.           Wynetta Emery, PA-C 04/02/12 0116

## 2012-04-01 NOTE — ED Notes (Signed)
Pt presented to ED with vomiting since morning.Pt complains of weakness and abdominal pain.

## 2012-04-01 NOTE — ED Notes (Signed)
Colostomy care given and colostomy bag changed.

## 2012-04-01 NOTE — ED Notes (Signed)
Pt reports left side abd pain and back pain. Had gi surgery one month ago, has drain in place. Reports always having this pain but has now run out of pain meds. Vomiting at triage.

## 2012-04-02 MED ORDER — ONDANSETRON HCL 4 MG PO TABS: 4.0000 mg | ORAL_TABLET | Freq: Three times a day (TID) | ORAL | Status: AC | PRN

## 2012-04-02 MED ORDER — HYDROMORPHONE HCL PF 1 MG/ML IJ SOLN
1.0000 mg | Freq: Once | INTRAMUSCULAR | Status: DC
  Filled 2012-04-02: qty 1

## 2012-04-02 MED ORDER — OXYCODONE-ACETAMINOPHEN 10-325 MG PO TABS: 1.0000 | ORAL_TABLET | ORAL | Status: AC | PRN

## 2012-04-02 MED ORDER — HYDROMORPHONE HCL PF 1 MG/ML IJ SOLN
1.0000 mg | Freq: Once | INTRAMUSCULAR | Status: AC
  Administered 2012-04-02: 1 mg via INTRAVENOUS

## 2012-04-02 NOTE — H&P (Signed)
BRIEF READMIT NOTE Subjective:  This is a very complicated 36 year old African American female who was just discharged from the hospital on June 20 after very prolonged hospitalization. She was in the hospital from May 6 through June 20. 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 She was taken to the operating room for sigmoid colectomy and diverting loop ileostomy. Her postoperative course was complicated by anastomotic breakdown and formation of multiple intra-abdominal abscesses. She had several percutaneous drains placed by interventional radiology. She also developed refractory thrombocytopenia possibly due to medication versus consumptive coagulopathy. Her thrombocytopenia eventually improved.  Since she was discharged, she has had some intermittent episodes of vomiting. She went to the emergency room on Thursday evening and had multiple episodes of emesis but left the ER prior to being seen by a physician. She states that after she threw-up she felt better. She states that she felt okay on Saturday and Sunday morning. However throughout the day on Sunday she developed worsening abdominal pain mainly in her upper abdomen. She describes it as constant. She describes it as a bubble in her stomach. She had multiple episodes of vomiting. She states that everything she ate on Saturday and early Sunday came back up. She still reports having air and stool in her ostomy bag. She denies any fevers or chills. She denies any dysuria. She denies any bleeding from her lips or rectum. She denies any chest pain shortness of breath.   Objective:  Vital signs in last 24 hours:  Temp: [97.7 F (36.5 C)-98.4 F (36.9 C)] 98.4 F (36.9 C) (06/24 0259)  Pulse Rate: [109-118] 109 (06/24 0625)  Resp: [16-24] 16 (06/24 0625)  BP: (113-129)/(81-89) 129/81 mmHg (06/24 0625)  SpO2: [99 %-100 %] 99 % (06/24 0625)  Intake/Output from previous day:  Intake/Output this shift:  Alert,  nad,  cta  Mild tachy  Soft, TTP in upper abd. A little distension. hypoBS  Ostomy RLQ - pink, viable. Some air and stool in bag.  Lower midline incision almost closed - 0.5cm opening. No cellulitis.  Perc drain on L - purulent  No edema   Lab Results:  Basename  03/16/12 0222  03/16/12 0153   WBC  --  14.4*   HGB  13.6  11.5*   HCT  40.0  34.6*   PLT  --  550*   BMET  Basename  03/16/12 0222   NA  135   K  3.4*   CL  98   CO2  --   GLUCOSE  92   BUN  4*   CREATININE  0.50   CALCIUM  --   PT/INR  No results found for this basename: LABPROT:2,INR:2 in the last 72 hours  ABG  No results found for this basename: PHART:2,PCO2:2,PO2:2,HCO3:2 in the last 72 hours  Studies/Results:   Ct Abdomen Pelvis W Contrast  03/16/2012 *RADIOLOGY REPORT* Clinical Data: Nausea, vomiting, and abdominal pain. CT ABDOMEN AND PELVIS WITH CONTRAST Technique: Multidetector CT imaging of the abdomen and pelvis was performed following the standard protocol during bolus administration of intravenous contrast. Contrast: 80mL OMNIPAQUE IOHEXOL 300 MG/ML SOLN Comparison: 02/27/2012 Findings: Atelectasis in the lung bases, improved since the previous study. Focal subcapsular fluid collection along the right anterolateral liver edge is decreasing in size since the previous study, now measuring about 18 mm maximal diameter. Left subdiaphragmatic and subcapsular splenic collections have also decreased in size. Surgical absence of the gallbladder. Pancreas, adrenal glands, kidneys, and abdominal aorta  are unremarkable. Infiltration or edema in the mesentery. Small amount of free fluid in Morison's pouch on the right with mild fluid in the pericolic gutters bilaterally. Tiny free air collections are demonstrated in the abdomen, likely remaining from previous surgery and catheter insertions. Small umbilical hernia with fluid and air. Skin clips have been removed. Mildly dilated small bowel without obvious wall thickening.  Changes could represent early obstruction versus ileus. Stool in the right colon with decompression of the distal colon. Colonic wall thickening cannot be excluded due to decompression. Right lower quadrant ileostomy. Surgical clips in the transverse colon region. Pelvis: Small amount of free fluid in the pelvis. Interval development of bladder wall thickening and bladder gas which could represent fistula versus bladder infection. Left lower quadrant pelvic drainage catheter remains unchanged in position. The previously demonstrated abscess cavity is significantly decreased in size. IMPRESSION: Left lower quadrant pelvic drainage catheter remains in place. There is significant decrease in size of the left pelvic, hepatic subcapsular, and splenic subcapsular/subdiaphragmatic abscess is since the previous study. Small bowel are mildly dilated which could be due to early obstruction or ileus. Follow-up recommended depending on the clinical course. Interval development of gas in the bladder which could represent fistula versus infection. Small amount of free fluid in the pelvis with mesenteric infiltration and scattered fluid along the pericolic gutters bilaterally. Original Report Authenticated By: Marlon Pel, M.D.   Dg Abd Acute W/chest  03/16/2012 *RADIOLOGY REPORT* Clinical Data: Left lower quadrant pain for 3 hours. Recent history of bowel obstruction with colostomy surgery in May 2013. ACUTE ABDOMEN SERIES (ABDOMEN 2 VIEW & CHEST 1 VIEW) Comparison: 02/02/2012 Findings: Normal heart size and pulmonary vascularity. No focal airspace consolidation in the lungs. Mild linear atelectasis in the lung bases. No pneumothorax. No blunting of costophrenic angles. Mild gaseous distension of small bowel with multiple air-fluid levels suggesting early mechanical obstruction. No free intra- abdominal air. Postsurgical changes scattered throughout the abdomen with drainage catheter in the left lower quadrant. Stool in  the right colon. IMPRESSION: No evidence of active pulmonary disease. Mild small bowel distension with multiple air-fluid levels suggesting early obstruction. Original Report Authenticated By: Marlon Pel, M.D.   Anti-infectives:  Anti-infectives    None     Assessment/Plan:  36 year old [place] American female with a history of cervical cancer with radiation treatment who developed a sigmoid stricture. She ultimately underwent when a sigmoid colectomy with a diverting loop ileostomy complicated by anastomotic breakdown and formation of multiple intra-abdominal abscesses these requiring percutaneous drainage. She also developed refractory thrombocytopenia but this subsequently improved. She has now developed findings consistent with partial small bowel obstruction. However her CT scan shows that the majority of her intra-abdominal abscesses have resolved.   Hypokalemia  Leukocytosis   We will admit the patient for bowel rest. I will off on NG tube placement at the time being. I advised the patient that we will place a nasogastric tube should she develop worsening pain or emesis. She still has purulent drainage from her one remaining percutaneous drain. She does need antibiotic therapy. I will hold off on starting antibiotics until discussing with infectious disease in an attempt to avoid a repeat thrombocytopenia.   [name]. [name], MD, FACS  General, Bariatric, & Minimally Invasive Surgery  [facility]  LOS: 0 days

## 2012-04-02 NOTE — ED Notes (Signed)
Pt for discharge.Pain has improved.Vital signs stable and GCS 15

## 2012-04-02 NOTE — ED Provider Notes (Signed)
Medical screening examination/treatment/procedure(s) were conducted as a shared visit with non-physician practitioner(s) and myself.  I personally evaluated the patient during the encounter.  I saw and examined the patient with N. Pisciotta and agree with her note, assessment, and plan.  This patient has a pmh of cervical cancer diagnosed two years ago and treated with radiation.  She developed subsequent psbo and required surgery and colostomy.  She developed post-op abscess and has a drain tube for this.  She also has had admissions recently for suspected psbo.  She presents today with abd pain, and she is out of her pain meds.  She has vomited at home, but there is stool in the bag.  On exam, the patient is afebrile and vitals are stable.  The heart and lung exam is unremarkable.  The abdomen is ttp in all four quadrants.  There is voluntary guarding but no rebound.  She was given ivf, pain meds and is feeling better.  She has not thrown up since arriving here.  The workup reveals a wbc of 12.7, but this is pretty much her baseline.  The acute abd series shows resolving ileus/psbo from prior films.  I spoke with Dr. [name] who is very familiar with the patient.  He is due to see her in a few days.  He does not feel as though this problem requires surgical intervention or admission.  She will be discharged to home, to return prn if worsens.  We will prescribe her the pain medication she is out of.    Geoffery Lyons, MD 04/02/12 [phone]

## 2012-04-07 ENCOUNTER — Telehealth (INDEPENDENT_AMBULATORY_CARE_PROVIDER_SITE_OTHER): Payer: Self-pay

## 2012-04-07 NOTE — Telephone Encounter (Signed)
Pt calling in requesting refill on pain medicine. I phoned in automatic refill of Hydrocodone 5/325mg  #30 phoned to [facility] [phone].

## 2012-04-16 ENCOUNTER — Telehealth (INDEPENDENT_AMBULATORY_CARE_PROVIDER_SITE_OTHER): Payer: Self-pay

## 2012-04-16 NOTE — Telephone Encounter (Signed)
Called patient to move appointment up due to MD in surgery. Could not complete call due to phone "out of area". Will try to call patient back to push appointment time back to 2:45pm on [phone].

## 2012-04-17 ENCOUNTER — Ambulatory Visit (INDEPENDENT_AMBULATORY_CARE_PROVIDER_SITE_OTHER): Payer: Medicaid Other | Admitting: General Surgery

## 2012-04-17 ENCOUNTER — Encounter (INDEPENDENT_AMBULATORY_CARE_PROVIDER_SITE_OTHER): Payer: Self-pay | Admitting: General Surgery

## 2012-04-17 VITALS — BP 122/76 | HR 68 | Temp 97.3°F | Resp 14 | Ht 64.0 in | Wt 112.4 lb

## 2012-04-17 DIAGNOSIS — K56609 Unspecified intestinal obstruction, unspecified as to partial versus complete obstruction: Secondary | ICD-10-CM

## 2012-04-17 DIAGNOSIS — K56699 Other intestinal obstruction unspecified as to partial versus complete obstruction: Secondary | ICD-10-CM

## 2012-04-17 MED ORDER — OXYCODONE-ACETAMINOPHEN 10-325 MG PO TABS: 1.0000 | ORAL_TABLET | Freq: Four times a day (QID) | ORAL | Status: DC | PRN

## 2012-04-17 NOTE — Progress Notes (Signed)
The patient comes in for her first postoperative visit since her diverting loop ileostomy and sigmoid were colon resection with postoperative anastomotic leak. The patient has a drain in her left lower abdomen which is draining liquid stool-like material. It is draining 100-200 cc per day.  The room where the patient was residing is filled with a foul-smelling odor. The percutaneous drain is in place and secured well. Her right lower quadrant diverting loop ileostomy is in place and appears to be working well.  The patient continues to have abdominal pain. There is no palpable mass. She is afebrile with a temperature of 97.3 her pulse is 68 and blood pressure 122/76. My next step is that we next at this to get a Gastrografin enema on the patient to see where she starts to leak from her distal Hartman's pouch. That point we may require an injection into her percutaneous drain to see where With the colon.  I will see the patient in my next available clinic appointment after she has had the study which I like to get in the middle of next week. She's very complex patient with a complex postoperative problem and this can take several studies and weeks to understand everything is going on. I like to see the patient in my clinic again a week from this coming Tuesday.

## 2012-04-21 ENCOUNTER — Ambulatory Visit (HOSPITAL_COMMUNITY)
Admission: RE | Admit: 2012-04-21 | Discharge: 2012-04-21 | Disposition: A | Discharge status: Home / Self Care | Payer: Medicaid Other | Source: Ambulatory Visit | Attending: General Surgery | Admitting: General Surgery

## 2012-04-21 DIAGNOSIS — K56609 Unspecified intestinal obstruction, unspecified as to partial versus complete obstruction: Secondary | ICD-10-CM | POA: Insufficient documentation

## 2012-04-21 DIAGNOSIS — K56699 Other intestinal obstruction unspecified as to partial versus complete obstruction: Secondary | ICD-10-CM

## 2012-04-21 MED ORDER — IOHEXOL 300 MG/ML  SOLN
200.0000 mL | Freq: Once | INTRAMUSCULAR | Status: AC | PRN
  Administered 2012-04-21: 200 mL

## 2012-04-22 ENCOUNTER — Encounter: Payer: Self-pay | Admitting: Radiation Oncology

## 2012-04-23 ENCOUNTER — Ambulatory Visit: Payer: Medicaid Other | Attending: Radiation Oncology | Admitting: Radiation Oncology

## 2012-04-28 ENCOUNTER — Ambulatory Visit (INDEPENDENT_AMBULATORY_CARE_PROVIDER_SITE_OTHER): Payer: Medicaid Other | Admitting: General Surgery

## 2012-04-28 ENCOUNTER — Encounter (INDEPENDENT_AMBULATORY_CARE_PROVIDER_SITE_OTHER): Payer: Self-pay | Admitting: General Surgery

## 2012-04-28 VITALS — BP 110/76 | HR 64 | Temp 97.1°F | Resp 16 | Ht 64.0 in | Wt 107.5 lb

## 2012-04-28 DIAGNOSIS — K632 Fistula of intestine: Secondary | ICD-10-CM

## 2012-04-28 MED ORDER — OXYCODONE-ACETAMINOPHEN 10-325 MG PO TABS: 1.0000 | ORAL_TABLET | Freq: Four times a day (QID) | ORAL | Status: DC | PRN

## 2012-04-28 NOTE — Progress Notes (Signed)
The patient comes in today complaining of severe abdominal pain. She states this is primarily because she's run out of her pain medication.  I reviewed the Gastrografin enema done to her right arm. 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 Surprisingly it also demonstrates a connection to her ileostomy which was not expected. This is possibly from the he currently on leaking into the subcutaneous and connected with a tract that goes down towards the abscess cavity.  However because of this I do not feel the patient would heal long-term. She will need a procedure prior to the take down of her stoma. I will see the patient back in one month. I will give her 120 Percocet tablets at this time. 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  In approximately 5 weeks I will admit the patient to the doctor the week service implantable reoperating honor to take care of her abscess cavity  Fistula and any other problems leading to continued drainage from her pelvic wound.

## 2012-04-30 ENCOUNTER — Encounter: Payer: Self-pay | Admitting: *Deleted

## 2012-05-15 ENCOUNTER — Telehealth (INDEPENDENT_AMBULATORY_CARE_PROVIDER_SITE_OTHER): Payer: Self-pay | Admitting: General Surgery

## 2012-05-15 NOTE — Telephone Encounter (Signed)
Pt called but can't leave message. The pain medication for Oxycodone 5mg  #50 is on a script at the front desk for the pt to p/u. Copy made for chart. Triage has been notified.

## 2012-05-15 NOTE — Telephone Encounter (Signed)
PT CALLED TO REQUEST PAIN MEDICATION REFILL/ PERCOCET WRITTEN ON 04-28-12 FOR #60 PER PHARMACY AT [facility] [place]/ [phone]/ I REVIEWED THIS WITH DR. [name] AND HE SAID OK IF SOMEONE IN OFFICE WOULD WRITE THE RX/ # PT GAVE IS [phone], I TRIED TO CALL AND VOICE MAIL HAS NOT BEEN ACTIVATED. HOPEFULLY PT WILL CALL BACK TO CHECK ON STATUS OF PERCOCET RX//GY

## 2012-05-26 ENCOUNTER — Ambulatory Visit (INDEPENDENT_AMBULATORY_CARE_PROVIDER_SITE_OTHER): Payer: Medicaid Other | Admitting: General Surgery

## 2012-05-26 ENCOUNTER — Encounter (INDEPENDENT_AMBULATORY_CARE_PROVIDER_SITE_OTHER): Payer: Self-pay | Admitting: General Surgery

## 2012-05-26 VITALS — BP 122/72 | HR 80 | Temp 96.9°F | Resp 18 | Ht 65.0 in | Wt 120.8 lb

## 2012-05-26 DIAGNOSIS — Z932 Ileostomy status: Secondary | ICD-10-CM | POA: Insufficient documentation

## 2012-05-26 MED ORDER — PROCHLORPERAZINE MALEATE 5 MG PO TABS: 5.0000 mg | ORAL_TABLET | Freq: Three times a day (TID) | ORAL | Status: DC

## 2012-05-26 MED ORDER — OXYCODONE-ACETAMINOPHEN 10-325 MG PO TABS: 1.0000 | ORAL_TABLET | Freq: Four times a day (QID) | ORAL | Status: DC | PRN

## 2012-05-26 NOTE — Progress Notes (Signed)
The patient is doing much better and feeling well. She's had minimal drainage from her left lower quadrant percutaneous drain. There still is a foul odor in the room with the patient likely secondary to the drainage around her percutaneous catheter.  Her loop ileostomy will not be removed at this next operation.the plan is to clean out the infection in her abdomen and needs adequate drainage so that she no longer requires percutaneous drainage. Also in the remaining colon that his disease will be removed.  I will call the patient in 2-3 days to schedule readmission next week when I am the doctor of the week the patient and be readmitted to the service for this operation.  I have agreed to refill her prescription for Percocet 10/325 #40. I will also refill her Compazine.

## 2012-06-05 ENCOUNTER — Telehealth (INDEPENDENT_AMBULATORY_CARE_PROVIDER_SITE_OTHER): Payer: Self-pay

## 2012-06-05 ENCOUNTER — Other Ambulatory Visit (INDEPENDENT_AMBULATORY_CARE_PROVIDER_SITE_OTHER): Payer: Self-pay

## 2012-06-05 NOTE — Telephone Encounter (Signed)
Pt called requesting refill percocet rx. Pt advised we will page Dr [name] to request refill and call her back.

## 2012-06-05 NOTE — Telephone Encounter (Signed)
Per Dr [name] Rx written for percocet 10/325 #40 by Dr Maisie Fus and at front desk to pick up.

## 2012-06-15 ENCOUNTER — Telehealth (INDEPENDENT_AMBULATORY_CARE_PROVIDER_SITE_OTHER): Payer: Self-pay | Admitting: General Surgery

## 2012-06-15 NOTE — Telephone Encounter (Signed)
Pt called in for pain med refill.  Per standing order protocol Hydrocodone 5/325 mg,  # 30, 1-2 po Q4-6H prn pain, no refill called to [facility]:  [phone].

## 2012-06-17 ENCOUNTER — Telehealth (INDEPENDENT_AMBULATORY_CARE_PROVIDER_SITE_OTHER): Payer: Self-pay | Admitting: General Surgery

## 2012-06-17 NOTE — Telephone Encounter (Signed)
Pt called to report no pain relief taking Vicodin 5/325 mg 2 tabs Q3H.  She states the meds make her sleepy but doesn't help with the pain.  She had # 30 called in on 06/15/12.  Paged and updated Dr. [name]; stated to continue her Vicodin (until he can get her back on his surgical schedule) as her pain is real.  Called the pharmacist at [facility]:  [phone] to let him make a note that [facility] would be calling in frequently for her until surgery could be scheduled.  He will reissue the Hydrocodone 5/325 mg, # 30, 1-2 po Q4-6H prn pain, no refill.  Pt is aware of all.

## 2012-06-23 NOTE — Telephone Encounter (Signed)
Pt called for another refill.  Rx called to [facility] for Hydrocodone 5/325 #30 w/ no refill.  Pt is aware.

## 2012-06-29 ENCOUNTER — Telehealth (INDEPENDENT_AMBULATORY_CARE_PROVIDER_SITE_OTHER): Payer: Self-pay | Admitting: General Surgery

## 2012-06-29 NOTE — Telephone Encounter (Signed)
LMOM for pharmacy to refill pt's hydrocodone-acetaminophen 5/325 mg #30 1-2 po Q4-6H prn, no refills  Per Dr. Biagio Quint.

## 2012-06-29 NOTE — Telephone Encounter (Signed)
LMOM letting pt know that I called in her Rx.

## 2012-06-29 NOTE — Telephone Encounter (Signed)
Pt called in asking for a refill on her Vicodin.  She states that she still has not been scheduled for surgery.  I informed her that Dr. Myriam Jacobson is out of the office this week but that I would run this by our urgent office doctor.

## 2012-07-06 ENCOUNTER — Telehealth (INDEPENDENT_AMBULATORY_CARE_PROVIDER_SITE_OTHER): Payer: Self-pay

## 2012-07-06 NOTE — Telephone Encounter (Signed)
The patient called in for a refill on her Hydrocodone to [facility].  She wants to know the status of setting up surgery.  Per Marcelino Duster, I called in a refill on her Hydrocodone per Dr Dixon Boos refill protocol.  We will check with Dr [name] on surgery and call her back.  I called in # 30 to the [facility] on [address] with no refill.

## 2012-07-09 ENCOUNTER — Telehealth (INDEPENDENT_AMBULATORY_CARE_PROVIDER_SITE_OTHER): Payer: Self-pay

## 2012-07-09 NOTE — Telephone Encounter (Signed)
I called patient to let her know [name] is looking to get her admitted around Nov. 12 th or 13th and surgery done possibly on Nov. 14th. She did not answer but left message with family member to call me back. We will call her once everything is set with schedulers. Please inform patient of this information if she calls back and I am not available to take the call.

## 2012-07-12 ENCOUNTER — Telehealth (INDEPENDENT_AMBULATORY_CARE_PROVIDER_SITE_OTHER): Payer: Self-pay | Admitting: General Surgery

## 2012-07-12 NOTE — Telephone Encounter (Signed)
She called to say that her catheter pulled out while getting up from the couch.  She says that she feels fine and that her drain had not been draining for a while anyway.I recommended that she call the office to schedule a follow up appt. With Dr. [name] at his next available or if she develops pain or fever or drainage to come to the ER for evaluation.

## 2012-07-13 ENCOUNTER — Telehealth (INDEPENDENT_AMBULATORY_CARE_PROVIDER_SITE_OTHER): Payer: Self-pay

## 2012-07-13 NOTE — Telephone Encounter (Signed)
The pt called for a refill on Vicodin to [facility] on [address].   Dr [name] is unavailable but I spoke to [facility] his nurse who states he is ok refilling her medicine.  I called it in to the [facility] #[facility] and I let the pt know

## 2012-07-21 ENCOUNTER — Encounter (INDEPENDENT_AMBULATORY_CARE_PROVIDER_SITE_OTHER): Payer: Self-pay | Admitting: General Surgery

## 2012-07-21 ENCOUNTER — Encounter (INDEPENDENT_AMBULATORY_CARE_PROVIDER_SITE_OTHER): Payer: Medicaid Other | Admitting: General Surgery

## 2012-07-21 ENCOUNTER — Ambulatory Visit (INDEPENDENT_AMBULATORY_CARE_PROVIDER_SITE_OTHER): Payer: Medicaid Other | Admitting: General Surgery

## 2012-07-21 VITALS — BP 138/96 | HR 90 | Temp 97.0°F | Ht 64.0 in | Wt 136.4 lb

## 2012-07-21 DIAGNOSIS — Z932 Ileostomy status: Secondary | ICD-10-CM

## 2012-07-21 NOTE — Progress Notes (Signed)
The patient comes in today after her percutaneous drain had been accidentally removed. She is feeling great. She's had no fevers or chills. She is eating well. She is gaining weight. She has absolutely no abdominal pain and is not requesting any pain medication.  In light of these significant improvements I will go ahead and get a CT scan of the abdomen and pelvis with oral and rectal contrast. The initial plan was just to remove all the necrotic and infected tissue but now I will plan on trying to reverse her ileostomy and reconnect her. This will be dependent somewhat on the findings on the CT scan with oral and rectal contrast.

## 2012-07-21 NOTE — Addendum Note (Signed)
Addended by: Milas Hock on: 07/21/2012 11:10 AM   Modules accepted: Orders

## 2012-07-23 ENCOUNTER — Ambulatory Visit (HOSPITAL_COMMUNITY)
Admission: RE | Admit: 2012-07-23 | Discharge: 2012-07-23 | Disposition: A | Discharge status: Home / Self Care | Payer: Medicaid Other | Source: Ambulatory Visit | Attending: General Surgery | Admitting: General Surgery

## 2012-07-23 DIAGNOSIS — Z932 Ileostomy status: Secondary | ICD-10-CM

## 2012-07-27 ENCOUNTER — Ambulatory Visit (HOSPITAL_COMMUNITY)
Admission: RE | Admit: 2012-07-27 | Discharge: 2012-07-27 | Disposition: A | Discharge status: Home / Self Care | Payer: Medicaid Other | Source: Ambulatory Visit | Attending: General Surgery | Admitting: General Surgery

## 2012-07-27 ENCOUNTER — Other Ambulatory Visit (INDEPENDENT_AMBULATORY_CARE_PROVIDER_SITE_OTHER): Payer: Self-pay | Admitting: General Surgery

## 2012-07-27 ENCOUNTER — Telehealth (INDEPENDENT_AMBULATORY_CARE_PROVIDER_SITE_OTHER): Payer: Self-pay

## 2012-07-27 DIAGNOSIS — Z432 Encounter for attention to ileostomy: Secondary | ICD-10-CM | POA: Insufficient documentation

## 2012-07-27 DIAGNOSIS — Z932 Ileostomy status: Secondary | ICD-10-CM

## 2012-07-27 NOTE — Telephone Encounter (Signed)
Refill called into pharmacy. Called patient to let her know.

## 2012-07-27 NOTE — Telephone Encounter (Signed)
The patient called requesting more Vicodin.  I see she was just seen last week.  Her CT scan was today.  Please advise.  She uses [facility] on [address].

## 2012-07-28 ENCOUNTER — Other Ambulatory Visit (INDEPENDENT_AMBULATORY_CARE_PROVIDER_SITE_OTHER): Payer: Self-pay | Admitting: General Surgery

## 2012-07-31 ENCOUNTER — Encounter (HOSPITAL_COMMUNITY): Payer: Self-pay | Admitting: Pharmacy Technician

## 2012-08-04 ENCOUNTER — Encounter (HOSPITAL_COMMUNITY)
Admission: RE | Admit: 2012-08-04 | Discharge: 2012-08-04 | Payer: Medicaid Other | Source: Ambulatory Visit | Attending: General Surgery | Admitting: General Surgery

## 2012-08-04 NOTE — Pre-Procedure Instructions (Signed)
20 Vanessa [name]  08/04/2012   Your procedure is scheduled on: 08/11/12  Report to [facility] at 800 AM.  Call this number if you have problems the morning of surgery: [phone]   Remember:   Do not eat food or drink:After Midnight.    Take these medicines the morning of surgery with A SIP OF WATER:protonix, compazine, percocet,   Do not wear jewelry, make-up or nail polish.  Do not wear lotions, powders, or perfumes..  Do not shave 48 hours prior to surgery. Men may shave face and neck.  Do not bring valuables to the hospital.  Contacts, dentures or bridgework may not be worn into surgery.  Leave suitcase in the car. After surgery it may be brought to your room.  For patients admitted to the hospital, checkout time is 11:00 AM the day of discharge.   Patients discharged the day of surgery will not be allowed to drive home.  Name and phone number of your driver:  Special Instructions: Shower using CHG 2 nights before surgery and the night before surgery.  If you shower the day of surgery use CHG.  Use special wash - you have one bottle of CHG for all showers.  You should use approximately 1/3 of the bottle for each shower.   Please read over the following fact sheets that you were given: Pain Booklet, Coughing and Deep Breathing, Blood Transfusion Information, MRSA Information and Surgical Site Infection Prevention

## 2012-08-07 ENCOUNTER — Encounter (HOSPITAL_COMMUNITY)
Admission: RE | Admit: 2012-08-07 | Discharge: 2012-08-07 | Disposition: A | Discharge status: Home / Self Care | Payer: Medicaid Other | Source: Ambulatory Visit | Attending: General Surgery | Admitting: General Surgery

## 2012-08-07 ENCOUNTER — Encounter (HOSPITAL_COMMUNITY): Payer: Self-pay

## 2012-08-07 ENCOUNTER — Ambulatory Visit (HOSPITAL_COMMUNITY)
Admission: RE | Admit: 2012-08-07 | Discharge: 2012-08-07 | Disposition: A | Discharge status: Home / Self Care | Payer: Medicaid Other | Source: Ambulatory Visit | Attending: General Surgery | Admitting: General Surgery

## 2012-08-07 DIAGNOSIS — I10 Essential (primary) hypertension: Secondary | ICD-10-CM | POA: Insufficient documentation

## 2012-08-07 DIAGNOSIS — C539 Malignant neoplasm of cervix uteri, unspecified: Secondary | ICD-10-CM | POA: Insufficient documentation

## 2012-08-07 LAB — SURGICAL PCR SCREEN
MRSA, PCR: NEGATIVE
Staphylococcus aureus: NEGATIVE

## 2012-08-07 LAB — CBC WITH DIFFERENTIAL/PLATELET
Basophils Relative: 1 % (ref 0–1)
Eosinophils Absolute: 0.2 10*3/uL (ref 0.0–0.7)
Hemoglobin: 12.4 g/dL (ref 12.0–15.0)
Lymphs Abs: 1.7 10*3/uL (ref 0.7–4.0)
MCH: 31.4 pg (ref 26.0–34.0)
Monocytes Relative: 10 % (ref 3–12)
Neutro Abs: 3 10*3/uL (ref 1.7–7.7)
Neutrophils Relative %: 55 % (ref 43–77)
Platelets: 336 10*3/uL (ref 150–400)
RBC: 3.95 MIL/uL (ref 3.87–5.11)

## 2012-08-07 LAB — COMPREHENSIVE METABOLIC PANEL
ALT: 9 U/L (ref 0–35)
Albumin: 3.6 g/dL (ref 3.5–5.2)
Alkaline Phosphatase: 74 U/L (ref 39–117)
BUN: 7 mg/dL (ref 6–23)
Chloride: 106 mEq/L (ref 96–112)
Glucose, Bld: 74 mg/dL (ref 70–99)
Potassium: 3.8 mEq/L (ref 3.5–5.1)
Sodium: 142 mEq/L (ref 135–145)
Total Bilirubin: 0.2 mg/dL — ABNORMAL LOW (ref 0.3–1.2)
Total Protein: 7.5 g/dL (ref 6.0–8.3)

## 2012-08-10 MED ORDER — METRONIDAZOLE IN NACL 5-0.79 MG/ML-% IV SOLN
500.0000 mg | INTRAVENOUS | Status: AC
  Administered 2012-08-11: 500 mg via INTRAVENOUS
  Filled 2012-08-10 (×2): qty 100

## 2012-08-10 MED ORDER — ALVIMOPAN 12 MG PO CAPS
12.0000 mg | ORAL_CAPSULE | Freq: Once | ORAL | Status: DC
  Filled 2012-08-10: qty 1

## 2012-08-10 MED ORDER — ALVIMOPAN 12 MG PO CAPS
12.0000 mg | ORAL_CAPSULE | Freq: Once | ORAL | Status: AC
  Administered 2012-08-11: 12 mg via ORAL
  Filled 2012-08-10: qty 1

## 2012-08-10 MED ORDER — CIPROFLOXACIN IN D5W 400 MG/200ML IV SOLN
400.0000 mg | INTRAVENOUS | Status: AC
  Administered 2012-08-11: 400 mg via INTRAVENOUS
  Filled 2012-08-10: qty 200

## 2012-08-11 ENCOUNTER — Encounter (HOSPITAL_COMMUNITY): Payer: Self-pay | Admitting: Anesthesiology

## 2012-08-11 ENCOUNTER — Encounter (HOSPITAL_COMMUNITY)
Admission: RE | Disposition: A | Discharge status: Home / Self Care | Payer: Self-pay | Source: Ambulatory Visit | Attending: General Surgery

## 2012-08-11 ENCOUNTER — Encounter (HOSPITAL_COMMUNITY): Payer: Self-pay

## 2012-08-11 ENCOUNTER — Ambulatory Visit (HOSPITAL_COMMUNITY): Payer: Medicaid Other | Admitting: Anesthesiology

## 2012-08-11 ENCOUNTER — Encounter (HOSPITAL_COMMUNITY): Payer: Self-pay | Admitting: *Deleted

## 2012-08-11 ENCOUNTER — Inpatient Hospital Stay (HOSPITAL_COMMUNITY)
Admission: RE | Admit: 2012-08-11 | Discharge: 2012-08-29 | DRG: 330 | Disposition: A | Discharge status: Home / Self Care | Payer: Medicaid Other | Source: Ambulatory Visit | Attending: General Surgery | Admitting: General Surgery

## 2012-08-11 DIAGNOSIS — IMO0002 Reserved for concepts with insufficient information to code with codable children: Secondary | ICD-10-CM | POA: Diagnosis present

## 2012-08-11 DIAGNOSIS — K560 Paralytic ileus: Secondary | ICD-10-CM | POA: Diagnosis present

## 2012-08-11 DIAGNOSIS — K929 Disease of digestive system, unspecified: Principal | ICD-10-CM | POA: Diagnosis present

## 2012-08-11 DIAGNOSIS — Z432 Encounter for attention to ileostomy: Secondary | ICD-10-CM

## 2012-08-11 DIAGNOSIS — K660 Peritoneal adhesions (postprocedural) (postinfection): Secondary | ICD-10-CM | POA: Diagnosis present

## 2012-08-11 DIAGNOSIS — Z5331 Laparoscopic surgical procedure converted to open procedure: Secondary | ICD-10-CM

## 2012-08-11 DIAGNOSIS — Z8541 Personal history of malignant neoplasm of cervix uteri: Secondary | ICD-10-CM

## 2012-08-11 DIAGNOSIS — Z932 Ileostomy status: Secondary | ICD-10-CM

## 2012-08-11 DIAGNOSIS — Y9210 Unspecified residential institution as the place of occurrence of the external cause: Secondary | ICD-10-CM | POA: Diagnosis present

## 2012-08-11 DIAGNOSIS — L589 Radiodermatitis, unspecified: Secondary | ICD-10-CM

## 2012-08-11 DIAGNOSIS — Y833 Surgical operation with formation of external stoma as the cause of abnormal reaction of the patient, or of later complication, without mention of misadventure at the time of the procedure: Secondary | ICD-10-CM | POA: Diagnosis present

## 2012-08-11 HISTORY — PX: LAPAROTOMY: SHX154

## 2012-08-11 HISTORY — PX: COLOSTOMY: SHX63

## 2012-08-11 HISTORY — PX: LAPAROSCOPIC LYSIS OF ADHESIONS: SHX5905

## 2012-08-11 HISTORY — PX: BOWEL RESECTION: SHX1257

## 2012-08-11 LAB — CBC WITH DIFFERENTIAL/PLATELET
Basophils Absolute: 0 10*3/uL (ref 0.0–0.1)
HCT: 39.2 % (ref 36.0–46.0)
Lymphocytes Relative: 8 % — ABNORMAL LOW (ref 12–46)
Neutro Abs: 7.4 10*3/uL (ref 1.7–7.7)
Platelets: 268 10*3/uL (ref 150–400)
RDW: 15.1 % (ref 11.5–15.5)
WBC: 8.9 10*3/uL (ref 4.0–10.5)

## 2012-08-11 LAB — BASIC METABOLIC PANEL
CO2: 21 mEq/L (ref 19–32)
Chloride: 106 mEq/L (ref 96–112)
Potassium: 4.4 mEq/L (ref 3.5–5.1)
Sodium: 138 mEq/L (ref 135–145)

## 2012-08-11 LAB — PREPARE RBC (CROSSMATCH)

## 2012-08-11 SURGERY — LAPAROTOMY, EXPLORATORY
Anesthesia: General | Site: Abdomen | Wound class: Clean Contaminated

## 2012-08-11 MED ORDER — METOCLOPRAMIDE HCL 5 MG/ML IJ SOLN
10.0000 mg | Freq: Once | INTRAMUSCULAR | Status: AC | PRN
  Administered 2012-08-11: 10 mg via INTRAVENOUS

## 2012-08-11 MED ORDER — WHITE PETROLATUM GEL
Status: AC
  Administered 2012-08-12
  Filled 2012-08-11: qty 5

## 2012-08-11 MED ORDER — INDIGOTINDISULFONATE SODIUM 8 MG/ML IJ SOLN
INTRAMUSCULAR | Status: AC
  Filled 2012-08-11: qty 5

## 2012-08-11 MED ORDER — LIDOCAINE HCL (CARDIAC) 20 MG/ML IV SOLN
INTRAVENOUS | Status: DC | PRN
  Administered 2012-08-11: 100 mg via INTRAVENOUS

## 2012-08-11 MED ORDER — DIPHENHYDRAMINE HCL 50 MG/ML IJ SOLN
12.5000 mg | Freq: Four times a day (QID) | INTRAMUSCULAR | Status: DC | PRN
  Administered 2012-08-12: 12.5 mg via INTRAVENOUS
  Filled 2012-08-11: qty 1

## 2012-08-11 MED ORDER — LACTATED RINGERS IV SOLN
INTRAVENOUS | Status: DC
  Administered 2012-08-11 (×3): via INTRAVENOUS

## 2012-08-11 MED ORDER — SODIUM CHLORIDE 0.9 % IV SOLN
INTRAVENOUS | Status: DC | PRN
  Administered 2012-08-11: 13:00:00 via INTRAVENOUS

## 2012-08-11 MED ORDER — FENTANYL CITRATE 0.05 MG/ML IJ SOLN
25.0000 ug | INTRAMUSCULAR | Status: DC | PRN
  Administered 2012-08-11 (×4): 50 ug via INTRAVENOUS

## 2012-08-11 MED ORDER — HYDROMORPHONE 0.3 MG/ML IV SOLN
INTRAVENOUS | Status: AC
  Filled 2012-08-11: qty 25

## 2012-08-11 MED ORDER — FLEET ENEMA 7-19 GM/118ML RE ENEM: 1.0000 | ENEMA | Freq: Once | RECTAL | Status: DC

## 2012-08-11 MED ORDER — PROPOFOL 10 MG/ML IV BOLUS
INTRAVENOUS | Status: DC | PRN
  Administered 2012-08-11: 50 mg via INTRAVENOUS
  Administered 2012-08-11: 30 mg via INTRAVENOUS
  Administered 2012-08-11: 20 mg via INTRAVENOUS
  Administered 2012-08-11: 50 mg via INTRAVENOUS
  Administered 2012-08-11: 20 mg via INTRAVENOUS
  Administered 2012-08-11: 30 mg via INTRAVENOUS
  Administered 2012-08-11: 200 mg via INTRAVENOUS

## 2012-08-11 MED ORDER — INDIGOTINDISULFONATE SODIUM 8 MG/ML IJ SOLN
INTRAMUSCULAR | Status: DC | PRN
  Administered 2012-08-11: 5 mL via INTRAVENOUS

## 2012-08-11 MED ORDER — HYDROMORPHONE 0.3 MG/ML IV SOLN
INTRAVENOUS | Status: DC
  Administered 2012-08-11: 3.9 mg via INTRAVENOUS
  Administered 2012-08-11 (×2): via INTRAVENOUS
  Administered 2012-08-12: 3.6 mg via INTRAVENOUS
  Administered 2012-08-12: 3.21 mg via INTRAVENOUS
  Administered 2012-08-12: 0.6 mg via INTRAVENOUS
  Administered 2012-08-12: 13:00:00 via INTRAVENOUS
  Administered 2012-08-12: 5.25 mg via INTRAVENOUS
  Administered 2012-08-12: 2.7 mg via INTRAVENOUS
  Administered 2012-08-13: 5.7 mg via INTRAVENOUS
  Administered 2012-08-13: 0.9 mg via INTRAVENOUS
  Administered 2012-08-13: 1.56 mg via INTRAVENOUS
  Administered 2012-08-13: 01:00:00 via INTRAVENOUS
  Administered 2012-08-13: 0.3 mg via INTRAVENOUS
  Administered 2012-08-13 – 2012-08-14 (×2): via INTRAVENOUS
  Administered 2012-08-14 (×2): 1.8 mg via INTRAVENOUS
  Administered 2012-08-14: 17:00:00 via INTRAVENOUS
  Administered 2012-08-14: 2.4 mg via INTRAVENOUS
  Administered 2012-08-14: 4.2 mg via INTRAVENOUS
  Administered 2012-08-14: 2.7 mg via INTRAVENOUS
  Administered 2012-08-15: 3 mg via INTRAVENOUS
  Administered 2012-08-15: 10:00:00 via INTRAVENOUS
  Administered 2012-08-15: 2.7 mg via INTRAVENOUS
  Administered 2012-08-16: 1.8 mg via INTRAVENOUS
  Administered 2012-08-16: 12:00:00 via INTRAVENOUS
  Administered 2012-08-16: 3.3 mg via INTRAVENOUS
  Administered 2012-08-16: 3 mg via INTRAVENOUS
  Administered 2012-08-16: 4 mg via INTRAVENOUS
  Administered 2012-08-16: 3.3 mg via INTRAVENOUS
  Administered 2012-08-17: 3.6 mg via INTRAVENOUS
  Administered 2012-08-17: 2.6 mg via INTRAVENOUS
  Administered 2012-08-17: 2 mg via INTRAVENOUS
  Filled 2012-08-11 (×12): qty 25

## 2012-08-11 MED ORDER — 0.9 % SODIUM CHLORIDE (POUR BTL) OPTIME
TOPICAL | Status: DC | PRN
  Administered 2012-08-11 (×3): 1000 mL

## 2012-08-11 MED ORDER — FENTANYL CITRATE 0.05 MG/ML IJ SOLN
INTRAMUSCULAR | Status: AC
  Filled 2012-08-11: qty 2

## 2012-08-11 MED ORDER — SODIUM CHLORIDE 0.9 % IJ SOLN: 9.0000 mL | INTRAMUSCULAR | Status: DC | PRN

## 2012-08-11 MED ORDER — MIDAZOLAM HCL 5 MG/5ML IJ SOLN
INTRAMUSCULAR | Status: DC | PRN
  Administered 2012-08-11: 2 mg via INTRAVENOUS

## 2012-08-11 MED ORDER — INFLUENZA VIRUS VACC SPLIT PF IM SUSP
0.5000 mL | INTRAMUSCULAR | Status: AC
  Filled 2012-08-11: qty 0.5

## 2012-08-11 MED ORDER — KCL IN DEXTROSE-NACL 20-5-0.45 MEQ/L-%-% IV SOLN
INTRAVENOUS | Status: AC
  Administered 2012-08-11 – 2012-08-14 (×8): via INTRAVENOUS
  Administered 2012-08-15: 125 mL via INTRAVENOUS
  Administered 2012-08-15 – 2012-08-17 (×6): via INTRAVENOUS
  Administered 2012-08-18: 1000 mL via INTRAVENOUS
  Administered 2012-08-18: 90 mL/h via INTRAVENOUS
  Administered 2012-08-19: 04:00:00 via INTRAVENOUS
  Filled 2012-08-11 (×23): qty 1000

## 2012-08-11 MED ORDER — NEOSTIGMINE METHYLSULFATE 1 MG/ML IJ SOLN
INTRAMUSCULAR | Status: DC | PRN
  Administered 2012-08-11: 3 mg via INTRAVENOUS

## 2012-08-11 MED ORDER — POVIDONE-IODINE 10 % EX OINT
TOPICAL_OINTMENT | CUTANEOUS | Status: AC
  Filled 2012-08-11: qty 28.35

## 2012-08-11 MED ORDER — GLYCOPYRROLATE 0.2 MG/ML IJ SOLN
INTRAMUSCULAR | Status: DC | PRN
  Administered 2012-08-11: 0.4 mg via INTRAVENOUS

## 2012-08-11 MED ORDER — ONDANSETRON HCL 4 MG/2ML IJ SOLN
4.0000 mg | Freq: Four times a day (QID) | INTRAMUSCULAR | Status: DC | PRN
  Administered 2012-08-16: 4 mg via INTRAVENOUS
  Filled 2012-08-11: qty 2

## 2012-08-11 MED ORDER — CHLORHEXIDINE GLUCONATE 0.12 % MT SOLN
15.0000 mL | Freq: Two times a day (BID) | OROMUCOSAL | Status: DC
  Administered 2012-08-11 – 2012-08-27 (×32): 15 mL via OROMUCOSAL
  Filled 2012-08-11 (×24): qty 15

## 2012-08-11 MED ORDER — FENTANYL CITRATE 0.05 MG/ML IJ SOLN: 100.0000 ug | INTRAMUSCULAR | Status: DC | PRN

## 2012-08-11 MED ORDER — FLEET ENEMA 7-19 GM/118ML RE ENEM
ENEMA | RECTAL | Status: AC
  Filled 2012-08-11: qty 1

## 2012-08-11 MED ORDER — ROCURONIUM BROMIDE 100 MG/10ML IV SOLN
INTRAVENOUS | Status: DC | PRN
  Administered 2012-08-11: 10 mg via INTRAVENOUS
  Administered 2012-08-11: 50 mg via INTRAVENOUS
  Administered 2012-08-11 (×2): 10 mg via INTRAVENOUS

## 2012-08-11 MED ORDER — PHENYLEPHRINE HCL 10 MG/ML IJ SOLN
INTRAMUSCULAR | Status: DC | PRN
  Administered 2012-08-11: 80 ug via INTRAVENOUS
  Administered 2012-08-11: 120 ug via INTRAVENOUS
  Administered 2012-08-11: 40 ug via INTRAVENOUS
  Administered 2012-08-11: 80 ug via INTRAVENOUS

## 2012-08-11 MED ORDER — NALOXONE HCL 0.4 MG/ML IJ SOLN: 0.4000 mg | INTRAMUSCULAR | Status: DC | PRN

## 2012-08-11 MED ORDER — POVIDONE-IODINE 10 % EX OINT
TOPICAL_OINTMENT | CUTANEOUS | Status: DC | PRN
  Administered 2012-08-11: 1 via TOPICAL

## 2012-08-11 MED ORDER — DIPHENHYDRAMINE HCL 12.5 MG/5ML PO ELIX
12.5000 mg | ORAL_SOLUTION | Freq: Four times a day (QID) | ORAL | Status: DC | PRN
  Filled 2012-08-11: qty 5

## 2012-08-11 MED ORDER — METRONIDAZOLE IN NACL 5-0.79 MG/ML-% IV SOLN
500.0000 mg | Freq: Three times a day (TID) | INTRAVENOUS | Status: AC
  Administered 2012-08-11 – 2012-08-12 (×2): 500 mg via INTRAVENOUS
  Filled 2012-08-11 (×2): qty 100

## 2012-08-11 MED ORDER — METOCLOPRAMIDE HCL 5 MG/ML IJ SOLN
INTRAMUSCULAR | Status: AC
  Filled 2012-08-11: qty 2

## 2012-08-11 MED ORDER — ARTIFICIAL TEARS OP OINT
TOPICAL_OINTMENT | OPHTHALMIC | Status: DC | PRN
  Administered 2012-08-11: 1 via OPHTHALMIC

## 2012-08-11 MED ORDER — ALBUMIN HUMAN 5 % IV SOLN
INTRAVENOUS | Status: DC | PRN
  Administered 2012-08-11: 12:00:00 via INTRAVENOUS

## 2012-08-11 MED ORDER — FENTANYL CITRATE 0.05 MG/ML IJ SOLN
INTRAMUSCULAR | Status: DC | PRN
  Administered 2012-08-11 (×2): 100 ug via INTRAVENOUS
  Administered 2012-08-11 (×2): 50 ug via INTRAVENOUS
  Administered 2012-08-11 (×2): 100 ug via INTRAVENOUS
  Administered 2012-08-11 (×3): 50 ug via INTRAVENOUS

## 2012-08-11 MED ORDER — CIPROFLOXACIN IN D5W 400 MG/200ML IV SOLN
400.0000 mg | Freq: Two times a day (BID) | INTRAVENOUS | Status: AC
  Administered 2012-08-11: 400 mg via INTRAVENOUS
  Filled 2012-08-11: qty 200

## 2012-08-11 MED ORDER — ONDANSETRON HCL 4 MG/2ML IJ SOLN
INTRAMUSCULAR | Status: DC | PRN
  Administered 2012-08-11: 4 mg via INTRAVENOUS

## 2012-08-11 SURGICAL SUPPLY — 60 items
BANDAGE GAUZE ELAST BULKY 4 IN (GAUZE/BANDAGES/DRESSINGS) ×2 IMPLANT
BLADE SURG ROTATE 9660 (MISCELLANEOUS) ×2 IMPLANT
CANISTER SUCTION 2500CC (MISCELLANEOUS) ×2 IMPLANT
CHLORAPREP W/TINT 26ML (MISCELLANEOUS) ×2 IMPLANT
CLOTH BEACON ORANGE TIMEOUT ST (SAFETY) ×2 IMPLANT
COVER MAYO STAND STRL (DRAPES) ×2 IMPLANT
COVER SURGICAL LIGHT HANDLE (MISCELLANEOUS) ×2 IMPLANT
DRAPE LAPAROSCOPIC ABDOMINAL (DRAPES) ×2 IMPLANT
DRAPE PROXIMA HALF (DRAPES) ×4 IMPLANT
DRAPE UTILITY 15X26 W/TAPE STR (DRAPE) ×4 IMPLANT
DRAPE WARM FLUID 44X44 (DRAPE) ×2 IMPLANT
ELECT BLADE 6.5 EXT (BLADE) ×2 IMPLANT
ELECT CAUTERY BLADE 6.4 (BLADE) ×2 IMPLANT
ELECT REM PT RETURN 9FT ADLT (ELECTROSURGICAL) ×2
ELECTRODE REM PT RTRN 9FT ADLT (ELECTROSURGICAL) ×1 IMPLANT
GLOVE BIO SURGEON STRL SZ 6 (GLOVE) ×6 IMPLANT
GLOVE BIO SURGEON STRL SZ7.5 (GLOVE) ×4 IMPLANT
GLOVE BIOGEL PI IND STRL 6.5 (GLOVE) ×2 IMPLANT
GLOVE BIOGEL PI IND STRL 7.5 (GLOVE) ×4 IMPLANT
GLOVE BIOGEL PI IND STRL 8 (GLOVE) ×1 IMPLANT
GLOVE BIOGEL PI INDICATOR 6.5 (GLOVE) ×2
GLOVE BIOGEL PI INDICATOR 7.5 (GLOVE) ×4
GLOVE BIOGEL PI INDICATOR 8 (GLOVE) ×1
GLOVE ECLIPSE 7.5 STRL STRAW (GLOVE) ×8 IMPLANT
GLOVE SURG SS PI 7.5 STRL IVOR (GLOVE) ×4 IMPLANT
GOWN PREVENTION PLUS XLARGE (GOWN DISPOSABLE) ×2 IMPLANT
GOWN PREVENTION PLUS XXLARGE (GOWN DISPOSABLE) ×4 IMPLANT
GOWN STRL NON-REIN LRG LVL3 (GOWN DISPOSABLE) ×12 IMPLANT
KIT BASIN OR (CUSTOM PROCEDURE TRAY) ×2 IMPLANT
KIT OSTOMY DRAINABLE 2.75 STR (WOUND CARE) ×2 IMPLANT
KIT ROOM TURNOVER OR (KITS) ×2 IMPLANT
LEGGING LITHOTOMY PAIR STRL (DRAPES) ×2 IMPLANT
LIGASURE IMPACT 36 18CM CVD LR (INSTRUMENTS) ×2 IMPLANT
NS IRRIG 1000ML POUR BTL (IV SOLUTION) ×6 IMPLANT
PACK GENERAL/GYN (CUSTOM PROCEDURE TRAY) ×2 IMPLANT
PAD ARMBOARD 7.5X6 YLW CONV (MISCELLANEOUS) ×4 IMPLANT
RELOAD PROXIMATE 75MM BLUE (ENDOMECHANICALS) ×8 IMPLANT
SLEEVE SURGEON STRL (DRAPES) ×4 IMPLANT
SPECIMEN JAR X LARGE (MISCELLANEOUS) IMPLANT
SPONGE GAUZE 4X4 12PLY (GAUZE/BANDAGES/DRESSINGS) ×2 IMPLANT
SPONGE LAP 18X18 X RAY DECT (DISPOSABLE) ×8 IMPLANT
STAPLER GUN LINEAR PROX 60 (STAPLE) ×2 IMPLANT
STAPLER PROXIMATE 75MM BLUE (STAPLE) ×2 IMPLANT
STAPLER VISISTAT 35W (STAPLE) ×2 IMPLANT
SUCTION POOLE TIP (SUCTIONS) ×2 IMPLANT
SUT NOVA 1 T20/GS 25DT (SUTURE) ×4 IMPLANT
SUT PDS AB 1 TP1 96 (SUTURE) ×4 IMPLANT
SUT SILK 2 0 SH CR/8 (SUTURE) ×4 IMPLANT
SUT SILK 2 0 TIES 10X30 (SUTURE) ×2 IMPLANT
SUT SILK 3 0 SH CR/8 (SUTURE) ×2 IMPLANT
SUT SILK 3 0 TIES 10X30 (SUTURE) ×2 IMPLANT
SUT VIC AB 3-0 SH 18 (SUTURE) ×2 IMPLANT
SYR BULB IRRIGATION 50ML (SYRINGE) ×2 IMPLANT
TAPE CLOTH SURG 4X10 WHT LF (GAUZE/BANDAGES/DRESSINGS) ×2 IMPLANT
TOWEL OR 17X24 6PK STRL BLUE (TOWEL DISPOSABLE) ×2 IMPLANT
TOWEL OR 17X26 10 PK STRL BLUE (TOWEL DISPOSABLE) ×2 IMPLANT
TOWEL OR NON WOVEN STRL DISP B (DISPOSABLE) ×4 IMPLANT
TRAY FOLEY CATH 14FRSI W/METER (CATHETERS) ×2 IMPLANT
WATER STERILE IRR 1000ML POUR (IV SOLUTION) IMPLANT
YANKAUER SUCT BULB TIP NO VENT (SUCTIONS) ×2 IMPLANT

## 2012-08-11 NOTE — Preoperative (Signed)
Beta Blockers   Reason not to administer Beta Blockers:Not Applicable 

## 2012-08-11 NOTE — Progress Notes (Signed)
Pt admitted to 3300 from PACU. Oriented to unit and room. Safety discussed and call bell with in reach. VSS.  NG tube to intermittent LWS. Colostomy is draining bright red blood and stoma is red. Dressing has been reinforced in PACU. Patient has been oriented to her PCA.Will continue to monitor. Elijah Birk, RN

## 2012-08-11 NOTE — Op Note (Signed)
OPERATIVE REPORT  DATE OF OPERATION: 08/11/2012  PATIENT:  Vanessa [name]  36 y.o. female  PRE-OPERATIVE DIAGNOSIS:  radiation proctitis/diverting ileostomy  POST-OPERATIVE DIAGNOSIS:  radiation proctitis/diverting ileostomy  PROCEDURE:  Procedure(s): EXPLORATORY LAPAROTOMY EXTENSIVE ENTEROLYSIS OF ADHESIONS SMALL BOWEL RESECTION COLOSTOMY RIGID SIGMOIDOSCOPY  SURGEON:  Surgeon(s): Cherylynn Ridges, MD Lodema Pilot, DO Almond Lint, MD  ASSISTANT: Biagio Quint and [facility]  ANESTHESIA:   general  EBL: 350 ml  BLOOD ADMINISTERED: 350 CC PRBC  DRAINS: Nasogastric Tube and Urinary Catheter (Foley)   SPECIMEN:  Source of Specimen:  Resected small bowel  COUNTS CORRECT:  YES  PROCEDURE DETAILS: The patient was taken to the operating room and placed on the table in the supine position. After an adequate endotracheal anesthetic was administered she was placed in lithotomy.  After proper time out was performed identifying the patient and the procedure to be performed we performed a rigid sigmoidoscopy in the operating room. We won't be able to get the rigid sigmoidoscope up to approximately 12 cm. At that point all blood was seen but no pus. We irrigated and flushed it out with Betadine solution.once this was done we prepped the perineum in the rectal area with Betadine in the abdominal wall with ChloraPrep. The ostomy was closed using running locking stitch of 2-0 silk.  A second time out was performed and we made a midline incision from above the previous midline incision above the umbilicus down to and through the old midline incision below the umbilicus. We dissected into the peritoneal cavity using electrocautery. There were extensive adhesions to the anterior abdominal wall including small bowel especially down in the pelvis. This is in an area where the patient had a previous inflammatory process and percutaneous drains for abscess.  A significant portion of time was spent up to  approximately 2 hours taking down adhesions and making sure that small bowel was in continuity. The loops of small bowel that were in a very tightly formed wad was in the pelvis were removed after significant amount of dissection. Approximately a foot and a half of small bowel was resected with primary anastomosis. This was done with a GIA-75 stapler.  This is just proximal to the terminal ileum. We were able to identify the ligament of Treitz. We ran the small bowel from the ligament of Treitz down to the new anastomosis. There were no other enterotomies.  Prior to doing the new anastomosis the loop ileostomy in the right lower quadrant was taken down. This was resected along with the small bowel that had been taken out of the pelvis.  Several surgeons attempted to insert and instrument or dilator through the anus into the rectum to see if we could see evidence of the intraperitoneal rectum. This was totally unsuccessful we could not even feel the sizers that were passed through the anus or the sigmoidoscope. The decision was made to give the patient a colostomy in the left lower to mid abdominal wall.  The proximal to mid descending colon was adhered to small bowel in the left side of the abdomen. Nose no fistulization. It seems as though from the previous process the mid distal to the descending colon must have been completely detached from the rectum respect to the patient's amount of sepsis postoperatively.  A colostomy was brought out the left side the abdomen and mid to upper abdomen. It was matured subsequently with 3-0 Vicryl pop-off sutures. We irrigated with copious amounts saline after changing gowns and gloves. The right  lower quadrant ileostomy site was closed internally and externally with #1 Novafil sutures. The midline fascia was closed using running looped #1 PDS suture. We left the subcutaneous tissue open for packing with a saline soaked Kerlix gauze in the right lower quadrant and the  midline incision. An ostomy appliance was placed on the new colostomy site all counts were correct.  PATIENT DISPOSITION:  PACU - hemodynamically stable.   Cherylynn Ridges 11/19/20132:24 PM

## 2012-08-11 NOTE — H&P (View-Only) (Signed)
The patient comes in today after her percutaneous drain had been accidentally removed. She is feeling great. She's had no fevers or chills. She is eating well. She is gaining weight. She has absolutely no abdominal pain and is not requesting any pain medication.  In light of these significant improvements I will go ahead and get a CT scan of the abdomen and pelvis with oral and rectal contrast. The initial plan was just to remove all the necrotic and infected tissue but now I will plan on trying to reverse her ileostomy and reconnect her. This will be dependent somewhat on the findings on the CT scan with oral and rectal contrast. 

## 2012-08-11 NOTE — Transfer of Care (Signed)
Immediate Anesthesia Transfer of Care Note  Patient: Vanessa [name]  Procedure(s) Performed: Procedure(s) (LRB) with comments: EXPLORATORY LAPAROTOMY (N/A) LAPAROSCOPIC LYSIS OF ADHESIONS (N/A) SMALL BOWEL RESECTION (N/A) COLOSTOMY (N/A)  Patient Location: PACU  Anesthesia Type:General  Level of Consciousness: awake, alert  and oriented  Airway & Oxygen Therapy: Patient Spontanous Breathing and Patient connected to nasal cannula oxygen  Post-op Assessment: Report given to PACU RN  Post vital signs: Reviewed and stable  Complications: No apparent anesthesia complications

## 2012-08-11 NOTE — Interval H&P Note (Signed)
History and Physical Interval Note:  08/11/2012 10:07 AM  Vanessa [name]  has presented today for surgery, with the diagnosis of radiation proctitis/diverting ileostomy  The various methods of treatment have been discussed with the patient and family. After consideration of risks, benefits and other options for treatment, the patient has consented to  Procedure(s) (LRB) with comments: EXPLORATORY LAPAROTOMY (N/A) - exploratory laparotomy/revision of ostomy, possible takedown of ileostomy/rigid sigmoidoscopy as a surgical intervention .  The patient's history has been reviewed, patient examined, no change in status, stable for surgery.  I have reviewed the patient's chart and labs.  Questions were answered to the patient's satisfaction.     Zackrey Dyar O  After he last visit the rectal study was unsuccessful.  May not be able to reverse patient, but may be able to change to a colostomy versus an ileostomy.  Marta Lamas. [name], MD, FACS [phone] [facility]

## 2012-08-11 NOTE — Anesthesia Postprocedure Evaluation (Signed)
Anesthesia Post Note  Patient: Vanessa [name]  Procedure(s) Performed: Procedure(s) (LRB): EXPLORATORY LAPAROTOMY (N/A) LAPAROSCOPIC LYSIS OF ADHESIONS (N/A) SMALL BOWEL RESECTION (N/A) COLOSTOMY (N/A)  Anesthesia type: general  Patient location: PACU  Post pain: Pain level controlled  Post assessment: Patient's Cardiovascular Status Stable  Last Vitals:  Filed Vitals:   08/11/12 1615  BP:   Pulse: 65  Temp:   Resp: 10    Post vital signs: Reviewed and stable  Level of consciousness: sedated  Complications: No apparent anesthesia complications

## 2012-08-11 NOTE — Anesthesia Procedure Notes (Signed)
Procedure Name: Intubation Date/Time: 08/11/2012 10:13 AM Performed by: Jefm Miles E Pre-anesthesia Checklist: Patient identified, Timeout performed, Emergency Drugs available, Suction available and Patient being monitored Patient Re-evaluated:Patient Re-evaluated prior to inductionOxygen Delivery Method: Circle system utilized Preoxygenation: Pre-oxygenation with 100% oxygen Intubation Type: IV induction Ventilation: Mask ventilation without difficulty Laryngoscope Size: Mac and 3 Grade View: Grade I Tube type: Oral Tube size: 7.0 mm Number of attempts: 1 Airway Equipment and Method: Stylet Placement Confirmation: ETT inserted through vocal cords under direct vision,  breath sounds checked- equal and bilateral and positive ETCO2 Secured at: 22 cm Tube secured with: Tape Dental Injury: Teeth and Oropharynx as per pre-operative assessment

## 2012-08-11 NOTE — Progress Notes (Signed)
Dr. Shelby Mattocks called oreders received to give more fentanyl for pain

## 2012-08-11 NOTE — Anesthesia Preprocedure Evaluation (Addendum)
Anesthesia Evaluation  Patient identified by MRN, date of birth, ID band Patient awake    Reviewed: Allergy & Precautions, H&P , NPO status , Patient's Chart, lab work & pertinent test results, reviewed documented beta blocker date and time   Airway Mallampati: II TM Distance: >3 FB Neck ROM: full    Dental  (+) Teeth Intact and Dental Advisory Given   Pulmonary neg pulmonary ROS,  breath sounds clear to auscultation        Cardiovascular hypertension, On Medications Rhythm:regular     Neuro/Psych negative neurological ROS  negative psych ROS   GI/Hepatic negative GI ROS, Neg liver ROS,   Endo/Other  negative endocrine ROS  Renal/GU Renal disease  negative genitourinary   Musculoskeletal   Abdominal   Peds  Hematology negative hematology ROS (+)   Anesthesia Other Findings See surgeon's H&P   Reproductive/Obstetrics negative OB ROS                          Anesthesia Physical Anesthesia Plan  ASA: III  Anesthesia Plan: General   Post-op Pain Management:    Induction: Intravenous  Airway Management Planned: Oral ETT  Additional Equipment:   Intra-op Plan:   Post-operative Plan: Extubation in OR  Informed Consent: I have reviewed the patients History and Physical, chart, labs and discussed the procedure including the risks, benefits and alternatives for the proposed anesthesia with the patient or authorized representative who has indicated his/her understanding and acceptance.   Dental Advisory Given  Plan Discussed with: CRNA and Surgeon  Anesthesia Plan Comments:         Anesthesia Quick Evaluation

## 2012-08-12 LAB — CBC
Hemoglobin: 12.4 g/dL (ref 12.0–15.0)
MCH: 31.2 pg (ref 26.0–34.0)
MCV: 94.7 fL (ref 78.0–100.0)
RBC: 3.97 MIL/uL (ref 3.87–5.11)

## 2012-08-12 LAB — BASIC METABOLIC PANEL
CO2: 25 mEq/L (ref 19–32)
Calcium: 10.4 mg/dL (ref 8.4–10.5)
Chloride: 107 mEq/L (ref 96–112)
Creatinine, Ser: 0.71 mg/dL (ref 0.50–1.10)
Glucose, Bld: 119 mg/dL — ABNORMAL HIGH (ref 70–99)

## 2012-08-12 LAB — TYPE AND SCREEN
Antibody Screen: NEGATIVE
Unit division: 0

## 2012-08-12 MED ORDER — SODIUM CHLORIDE 0.9 % IV SOLN
500.0000 mL | Freq: Once | INTRAVENOUS | Status: AC
  Administered 2012-08-12: 500 mL via INTRAVENOUS

## 2012-08-12 MED ORDER — LIDOCAINE-EPINEPHRINE 1 %-1:100000 IJ SOLN
20.0000 mL | Freq: Once | INTRAMUSCULAR | Status: DC
  Filled 2012-08-12: qty 20

## 2012-08-12 MED ORDER — DIPHENHYDRAMINE HCL 12.5 MG/5ML PO ELIX
12.5000 mg | ORAL_SOLUTION | Freq: Four times a day (QID) | ORAL | Status: DC | PRN
  Filled 2012-08-12: qty 5

## 2012-08-12 MED ORDER — DIPHENHYDRAMINE HCL 50 MG/ML IJ SOLN
25.0000 mg | Freq: Four times a day (QID) | INTRAMUSCULAR | Status: DC | PRN
  Administered 2012-08-12 – 2012-08-13 (×4): 25 mg via INTRAVENOUS
  Filled 2012-08-12 (×4): qty 1

## 2012-08-12 MED ORDER — DIPHENHYDRAMINE HCL 50 MG/ML IJ SOLN
12.5000 mg | Freq: Once | INTRAMUSCULAR | Status: AC
  Administered 2012-08-12: 12.5 mg via INTRAVENOUS
  Filled 2012-08-12: qty 1

## 2012-08-12 MED ORDER — WHITE PETROLATUM GEL
Status: AC
  Administered 2012-08-12: 15:00:00
  Filled 2012-08-12: qty 5

## 2012-08-12 MED ORDER — ACETAMINOPHEN 10 MG/ML IV SOLN
1000.0000 mg | Freq: Four times a day (QID) | INTRAVENOUS | Status: AC
  Administered 2012-08-12 – 2012-08-13 (×4): 1000 mg via INTRAVENOUS
  Filled 2012-08-12 (×5): qty 100

## 2012-08-12 MED ORDER — ENOXAPARIN SODIUM 40 MG/0.4ML ~~LOC~~ SOLN
40.0000 mg | SUBCUTANEOUS | Status: DC
  Filled 2012-08-12 (×2): qty 0.4

## 2012-08-12 MED ORDER — LIDOCAINE HCL (PF) 1 % IJ SOLN
INTRAMUSCULAR | Status: AC
  Filled 2012-08-12: qty 5

## 2012-08-12 MED ORDER — KETOROLAC TROMETHAMINE 15 MG/ML IJ SOLN
15.0000 mg | Freq: Four times a day (QID) | INTRAMUSCULAR | Status: AC | PRN
  Administered 2012-08-12: 15 mg via INTRAVENOUS
  Filled 2012-08-12: qty 1

## 2012-08-12 NOTE — Progress Notes (Signed)
Called by nurse because of bleeding from midline incision.  He had soaked thru an ABD pad and had a bleeder  Coming from the right upper skin edge.  We held pressure for about 30 min and it stopped.  We have suture tray, lidocaine with epinephrine at the bedside if it should occur again.

## 2012-08-12 NOTE — Progress Notes (Signed)
Possibly because the patient is getting Lovenox.  However, stopped without significant intervention.  Marta Lamas. [name], MD, FACS [phone] [facility]

## 2012-08-12 NOTE — Progress Notes (Signed)
Report called to Alona Bene, Consulting civil engineer on 6N.  Pt transferred to 6N15 via wheelchair with all belongings.  Pt to call family with new room number.    Roselie Awkward, RN

## 2012-08-12 NOTE — Progress Notes (Signed)
GS Progress Note Subjective: Patient had some pain issues overnight, treated with IV tylenol.  Urine output okay.  Objective: Vital signs in last 24 hours: Temp:  [96.1 F (35.6 C)-98 F (36.7 C)] 97.4 F (36.3 C) (11/20 0400) Pulse Rate:  [61-107] 89  (11/20 0400) Resp:  [9-23] 23  (11/20 0400) BP: (115-144)/(77-104) 120/80 mmHg (11/20 0400) SpO2:  [97 %-100 %] 100 % (11/20 0400) Weight:  [65.2 kg (143 lb 11.8 oz)] 65.2 kg (143 lb 11.8 oz) (11/19 1739) Last BM Date:  (PTA)  Intake/Output from previous day: 11/19 0701 - 11/20 0700 In: 5125 [I.V.:4125; Blood:350; IV Piggyback:650] Out: 1225 [Urine:570; Emesis/NG output:250; Stool:5; Blood:400] Intake/Output this shift: Total I/O In: 1400 [I.V.:1000; IV Piggyback:400] Out: 655 [Urine:400; Emesis/NG output:250; Stool:5]  Lungs: Clear.  Oxygen saturations 100% on RA  Abd: Soft, no peritonitis.  On full dose PCA  Extremities: No DVT signs or symptoms.  Will start Lovenox  Neuro: Intact  Lab Results: CBC   Basename 08/11/12 1940  WBC 8.9  HGB 13.1  HCT 39.2  PLT 268   BMET  Basename 08/11/12 1940  NA 138  K 4.4  CL 106  CO2 21  GLUCOSE 151*  BUN 10  CREATININE 0.68  CALCIUM 9.8   PT/INR No results found for this basename: LABPROT:2,INR:2 in the last 72 hours ABG No results found for this basename: PHART:2,PCO2:2,PO2:2,HCO3:2 in the last 72 hours  Studies/Results: No results found.  Anti-infectives: Anti-infectives     Start     Dose/Rate Route Frequency Ordered Stop   08/11/12 1800   ciprofloxacin (CIPRO) IVPB 400 mg        400 mg 200 mL/hr over 60 Minutes Intravenous Every 12 hours 08/11/12 1746 08/11/12 2116   08/11/12 1800   metroNIDAZOLE (FLAGYL) IVPB 500 mg        500 mg 100 mL/hr over 60 Minutes Intravenous Every 8 hours 08/11/12 1746 08/12/12 0217   08/11/12 0600   metroNIDAZOLE (FLAGYL) IVPB 500 mg        500 mg 100 mL/hr over 60 Minutes Intravenous On call to O.R. 08/10/12 1428 08/11/12  1020   08/11/12 0600   ciprofloxacin (CIPRO) IVPB 400 mg        400 mg 200 mL/hr over 60 Minutes Intravenous On call to O.R. 08/10/12 1428 08/11/12 1020          Assessment/Plan: s/p Procedure(s): EXPLORATORY LAPAROTOMY LAPAROSCOPIC LYSIS OF ADHESIONS SMALL BOWEL RESECTION COLOSTOMY Continue ABX therapy due to Post-op infection Continue foley due to strict I&O Transfer to floor. OOB with assistance  LOS: 1 day    Marta Lamas. [name], MD, FACS [phone] [facility] 08/12/2012

## 2012-08-12 NOTE — Consult Note (Addendum)
WOC ostomy consult  Stoma type/location:  Pt familiar to ostomy team from previous admission for initial ostomy surgery.  Has now had revision from ileostomy to colostomy with stoma relocation. Stomal assessment/size: 1 1/2 inches, red and viable, appears to be flush with skin level.  Did not remove pouch at this time; visualized through it since this is first post-op day. Output  Small amt bloody drainage in pouch. Ostomy pouching: 2pc.  Education provided: Pt familiar with pouching routines and ordering supplies.  Has been independent with pouch application and emptying prior to admission. Supplies ordered to bedside for staff use.  Will change pouch later this week when feeling better.  Requested to assess abd wounds.   Right abd full thickness wound to previous ostomy site.  100% red, small amt blood-tinged drainage 3X2X1cm. Midline abd 16X2.5X1cm with same appearance.  Pt would be good candidate for vac to promote wound healing once it is not the immediate post-op period, since wound still bleeds easily when assessed.  Please order vac application if you agree with this plan of care when wounds are ready for  both abd full thickness wound sites.   Cammie Mcgee, RN, MSN, [employer]  [phone]

## 2012-08-13 ENCOUNTER — Encounter (HOSPITAL_COMMUNITY): Payer: Self-pay | Admitting: General Surgery

## 2012-08-13 LAB — CBC WITH DIFFERENTIAL/PLATELET
Basophils Absolute: 0 10*3/uL (ref 0.0–0.1)
Eosinophils Relative: 1 % (ref 0–5)
HCT: 30.4 % — ABNORMAL LOW (ref 36.0–46.0)
Hemoglobin: 10.3 g/dL — ABNORMAL LOW (ref 12.0–15.0)
Lymphocytes Relative: 7 % — ABNORMAL LOW (ref 12–46)
MCHC: 33.9 g/dL (ref 30.0–36.0)
MCV: 93.5 fL (ref 78.0–100.0)
Monocytes Absolute: 0.8 10*3/uL (ref 0.1–1.0)
Monocytes Relative: 5 % (ref 3–12)
RDW: 14.4 % (ref 11.5–15.5)
WBC: 15.1 10*3/uL — ABNORMAL HIGH (ref 4.0–10.5)

## 2012-08-13 LAB — BASIC METABOLIC PANEL
BUN: 10 mg/dL (ref 6–23)
BUN: 9 mg/dL (ref 6–23)
CO2: 27 mEq/L (ref 19–32)
CO2: 24 mEq/L (ref 19–32)
Calcium: 11 mg/dL — ABNORMAL HIGH (ref 8.4–10.5)
Chloride: 104 mEq/L (ref 96–112)
Creatinine, Ser: 0.75 mg/dL (ref 0.50–1.10)
Creatinine, Ser: 0.61 mg/dL (ref 0.50–1.10)

## 2012-08-13 LAB — CBC
HCT: 29.4 % — ABNORMAL LOW (ref 36.0–46.0)
MCV: 94.5 fL (ref 78.0–100.0)
RBC: 3.11 MIL/uL — ABNORMAL LOW (ref 3.87–5.11)
WBC: 12 10*3/uL — ABNORMAL HIGH (ref 4.0–10.5)

## 2012-08-13 MED ORDER — HYDROMORPHONE HCL PF 1 MG/ML IJ SOLN
2.0000 mg | Freq: Once | INTRAMUSCULAR | Status: AC
  Administered 2012-08-13: 2 mg via INTRAVENOUS

## 2012-08-13 MED ORDER — HYDROMORPHONE HCL PF 1 MG/ML IJ SOLN
INTRAMUSCULAR | Status: AC
  Filled 2012-08-13: qty 2

## 2012-08-13 MED ORDER — SILVER NITRATE-POT NITRATE 75-25 % EX MISC
1.0000 "application " | Freq: Once | CUTANEOUS | Status: AC
  Administered 2012-08-13: 1 via TOPICAL
  Filled 2012-08-13: qty 1

## 2012-08-13 MED ORDER — SODIUM CHLORIDE 0.9 % IV SOLN
1.0000 g | INTRAVENOUS | Status: DC
  Administered 2012-08-14 – 2012-08-25 (×12): 1 g via INTRAVENOUS
  Filled 2012-08-13 (×15): qty 1

## 2012-08-13 MED ORDER — SILVER NITRATE-POT NITRATE 75-25 % EX MISC
5.0000 | CUTANEOUS | Status: DC | PRN
  Filled 2012-08-13: qty 5

## 2012-08-13 NOTE — Progress Notes (Signed)
Patient looking a bit sicker today.  Has more abdominal pan.  Bleeding from the old ostomy site this morning.  Hemoglobin has dropped.  Applied silver nitrate to wound with success.  Will Stop Lovenox.  Reassess later today.

## 2012-08-13 NOTE — Progress Notes (Signed)
UR completed 

## 2012-08-13 NOTE — Consult Note (Addendum)
Ostomy follow-up:  Pt was having bleeding from both abd full thickness wounds last night and was managed by CCS team.  They continue to follow for assessment and plan of care to abd wound sites. Not ready for Vac therapy until stable.  Bedside nurse changed colostomy pouch last night after bleeding episode.  Intact with good seal at this time.  Small amt bloody drainage in pouch; no stool or flatus.  Pt still with NG and in pain.  Stoma visualized through pouch which was not removed.  Stoma is dark purple and flush with skin level. Pt is familiar with pouching routines, emptying, and ordering supplies from prior ostomy.  She will be able to continue using the pouching system she was previously using, with the addition of barrier rings around stoma to increase wear time and maintain good seal.  Demonstrated application and use of barrier ring to pt and family member at bedside.  Supplies ordered to bedside for staff use.  Cammie Mcgee, RN, MSN, [employer]  [phone]

## 2012-08-13 NOTE — Progress Notes (Signed)
2 Days Post-Op  Subjective: NAEO. Tearful this am stating she wants to go home. Eat 5-6 cups of ice yesterday adn very hungry. Pain well controlled. Passing flatus but no BM.  Objective: Vital signs in last 24 hours: Temp:  [97.1 F (36.2 C)-100 F (37.8 C)] 99 F (37.2 C) (11/21 0604) Pulse Rate:  [74-106] 102  (11/21 0604) Resp:  [13-23] 15  (11/21 0801) BP: (111-137)/(73-86) 131/83 mmHg (11/21 0604) SpO2:  [96 %-100 %] 98 % (11/21 0604) Last BM Date:  (PTA)  Intake/Output from previous day: 11/20 0701 - 11/21 0700 In: 3359.7 [I.V.:2459.7; NG/GT:400; IV Piggyback:100] Out: 1550 [Urine:300; Emesis/NG output:1250] Intake/Output this shift:    PE: Gen: WNWD, tearful CV: RRR Res: normal effort Abd: absent bowel sounds. NGT in place, midline incision open and w/o erythema or purulent DC, old ostomy site w/ fresh bleeding present. Pressure applied for 20 min w/ sliver nitrate sticks applied w/ cessation of bleeding  Lab Results:   Basename 08/13/12 0525 08/12/12 0516  WBC 12.0* 9.8  HGB 9.7* 12.4  HCT 29.4* 37.6  PLT 201 253   BMET  Basename 08/13/12 0525 08/12/12 0516  NA 137 140  K 4.5 4.9  CL 104 107  CO2 27 25  GLUCOSE 94 119*  BUN 10 11  CREATININE 0.75 0.71  CALCIUM 10.8* 10.4   PT/INR No results found for this basename: LABPROT:2,INR:2 in the last 72 hours CMP     Component Value Date/Time   NA 137 08/13/2012 0525   K 4.5 08/13/2012 0525   CL 104 08/13/2012 0525   CO2 27 08/13/2012 0525   GLUCOSE 94 08/13/2012 0525   BUN 10 08/13/2012 0525   CREATININE 0.75 08/13/2012 0525   CALCIUM 10.8* 08/13/2012 0525   CALCIUM 10.7* 02/01/2012 0705   PROT 7.5 08/07/2012 1437   ALBUMIN 3.6 08/07/2012 1437   AST 14 08/07/2012 1437   ALT 9 08/07/2012 1437   ALKPHOS 74 08/07/2012 1437   BILITOT 0.2* 08/07/2012 1437   GFRNONAA >90 08/13/2012 0525   GFRAA >90 08/13/2012 0525   Lipase     Component Value Date/Time   LIPASE 16 03/21/2012 0647        Studies/Results: No results found.  Anti-infectives: Anti-infectives     Start     Dose/Rate Route Frequency Ordered Stop   08/11/12 1800   ciprofloxacin (CIPRO) IVPB 400 mg        400 mg 200 mL/hr over 60 Minutes Intravenous Every 12 hours 08/11/12 1746 08/11/12 2116   08/11/12 1800   metroNIDAZOLE (FLAGYL) IVPB 500 mg        500 mg 100 mL/hr over 60 Minutes Intravenous Every 8 hours 08/11/12 1746 08/12/12 0217   08/11/12 0600   metroNIDAZOLE (FLAGYL) IVPB 500 mg        500 mg 100 mL/hr over 60 Minutes Intravenous On call to O.R. 08/10/12 1428 08/11/12 1020   08/11/12 0600   ciprofloxacin (CIPRO) IVPB 400 mg        400 mg 200 mL/hr over 60 Minutes Intravenous On call to O.R. 08/10/12 1428 08/11/12 1020           Assessment/Plan  36yo F s/p  ExLap adn lysis of adhesions w/ small bowel resection and colostomy.  Hgb down today likely from contnued blood loss. Repeat CBC in am Tachy likely from anemia White count likely from acute stress reaction vs hemoconcentration vs infection vs atelectasis  Cont ABX Continue wet to dry dressing changes Continue  foley w/ strict I&O OOB to chair Continue NGT as w/ >1L output yesterday Continue Dilaudid PCA until PO returns   LOS: 2 days    Vanessa [name] Resident PGY-2 08/13/2012, 8:26 AM

## 2012-08-14 ENCOUNTER — Encounter (HOSPITAL_COMMUNITY): Payer: Self-pay

## 2012-08-14 LAB — BASIC METABOLIC PANEL
BUN: 5 mg/dL — ABNORMAL LOW (ref 6–23)
CO2: 27 mEq/L (ref 19–32)
Chloride: 100 mEq/L (ref 96–112)
Creatinine, Ser: 0.69 mg/dL (ref 0.50–1.10)

## 2012-08-14 LAB — CBC WITH DIFFERENTIAL/PLATELET
HCT: 28.9 % — ABNORMAL LOW (ref 36.0–46.0)
Hemoglobin: 9.4 g/dL — ABNORMAL LOW (ref 12.0–15.0)
Lymphocytes Relative: 9 % — ABNORMAL LOW (ref 12–46)
Lymphs Abs: 1.1 10*3/uL (ref 0.7–4.0)
Monocytes Absolute: 1 10*3/uL (ref 0.1–1.0)
Monocytes Relative: 8 % (ref 3–12)
Neutro Abs: 10.8 10*3/uL — ABNORMAL HIGH (ref 1.7–7.7)
RBC: 3.07 MIL/uL — ABNORMAL LOW (ref 3.87–5.11)
WBC: 13.2 10*3/uL — ABNORMAL HIGH (ref 4.0–10.5)

## 2012-08-14 NOTE — Progress Notes (Signed)
GS Progress Note Subjective: Patient looks a whole lot better today than yesterday.  I started her empirically on Invanz for elevated WBC and low grade fever.  Also, stopped her Lovenox.  No more bleeding from her wound.  Objective: Vital signs in last 24 hours: Temp:  [98 F (36.7 C)-99 F (37.2 C)] 98 F (36.7 C) (11/22 0500) Pulse Rate:  [93-111] 95  (11/22 0500) Resp:  [14-25] 14  (11/22 0817) BP: (114-126)/(75-87) 126/75 mmHg (11/22 0500) SpO2:  [97 %-100 %] 100 % (11/22 0817) Last BM Date:  (PTA)  Intake/Output from previous day: 11/21 0701 - 11/22 0700 In: 2600 [I.V.:2500; NG/GT:100] Out: 1700 [Urine:500; Emesis/NG output:1200] Intake/Output this shift:    Lungs: Clear  Abd: Hypoactive but present bowel sounds.  Stoma is viable but there is no output yet  NGT output 1200 cc per 24 hours.  Extremities: No DVT signs or symptoms.  Wearing SCDs at night time.  Neuro: Intact  Lab Results: CBC   Basename 08/14/12 0608 08/13/12 0947  WBC 13.2* 15.1*  HGB 9.4* 10.3*  HCT 28.9* 30.4*  PLT 230 225   BMET  Basename 08/14/12 0608 08/13/12 0947  NA 134* 133*  K 3.6 3.9  CL 100 101  CO2 27 24  GLUCOSE 100* 103*  BUN 5* 9  CREATININE 0.69 0.61  CALCIUM 11.2* 11.0*   PT/INR No results found for this basename: LABPROT:2,INR:2 in the last 72 hours ABG No results found for this basename: PHART:2,PCO2:2,PO2:2,HCO3:2 in the last 72 hours  Studies/Results: No results found.  Anti-infectives: Anti-infectives     Start     Dose/Rate Route Frequency Ordered Stop   08/13/12 1800   ertapenem (INVANZ) 1 g in sodium chloride 0.9 % 50 mL IVPB        1 g 100 mL/hr over 30 Minutes Intravenous Every 24 hours 08/13/12 1723     08/11/12 1800   ciprofloxacin (CIPRO) IVPB 400 mg        400 mg 200 mL/hr over 60 Minutes Intravenous Every 12 hours 08/11/12 1746 08/11/12 2116   08/11/12 1800   metroNIDAZOLE (FLAGYL) IVPB 500 mg        500 mg 100 mL/hr over 60 Minutes  Intravenous Every 8 hours 08/11/12 1746 08/12/12 0217   08/11/12 0600   metroNIDAZOLE (FLAGYL) IVPB 500 mg        500 mg 100 mL/hr over 60 Minutes Intravenous On call to O.R. 08/10/12 1428 08/11/12 1020   08/11/12 0600   ciprofloxacin (CIPRO) IVPB 400 mg        400 mg 200 mL/hr over 60 Minutes Intravenous On call to O.R. 08/10/12 1428 08/11/12 1020          Assessment/Plan: s/p Procedure(s): EXPLORATORY LAPAROTOMY LAPAROSCOPIC LYSIS OF ADHESIONS SMALL BOWEL RESECTION COLOSTOMY Keep NGT until some activity of stoma.  LOS: 3 days    Marta Lamas. [name], MD, FACS [phone] [facility] 08/14/2012

## 2012-08-15 LAB — CBC WITH DIFFERENTIAL/PLATELET
Basophils Absolute: 0 10*3/uL (ref 0.0–0.1)
Eosinophils Relative: 3 % (ref 0–5)
HCT: 28.1 % — ABNORMAL LOW (ref 36.0–46.0)
Hemoglobin: 9.2 g/dL — ABNORMAL LOW (ref 12.0–15.0)
Lymphocytes Relative: 11 % — ABNORMAL LOW (ref 12–46)
MCHC: 32.7 g/dL (ref 30.0–36.0)
MCV: 93 fL (ref 78.0–100.0)
Monocytes Absolute: 1.2 10*3/uL — ABNORMAL HIGH (ref 0.1–1.0)
Monocytes Relative: 11 % (ref 3–12)
RDW: 13.9 % (ref 11.5–15.5)

## 2012-08-15 LAB — BASIC METABOLIC PANEL
Chloride: 103 mEq/L (ref 96–112)
GFR calc Af Amer: >90 mL/min (ref >90)
GFR calc non Af Amer: >90 mL/min (ref >90)
Glucose, Bld: 105 mg/dL — ABNORMAL HIGH (ref 70–99)

## 2012-08-15 NOTE — Progress Notes (Signed)
4 Days Post-Op  Subjective: She feels OK, not much if any gas in ostomy.  No stool just some clear red tinged liquid. Not walking much  Objective: Vital signs in last 24 hours: Temp:  [98 F (36.7 C)-99 F (37.2 C)] 98 F (36.7 C) (11/23 1019) Pulse Rate:  [93-105] 97  (11/23 1019) Resp:  [8-22] 18  (11/23 1019) BP: (118-133)/(77-92) 133/90 mmHg (11/23 1019) SpO2:  [98 %-100 %] 99 % (11/23 1019) Last BM Date:  (PTA) NPO, afebrile, VSS, labs OK,   Intake/Output from previous day: 11/22 0701 - 11/23 0700 In: 3602.1 [I.V.:3602.1] Out: 500 [Urine:500] Intake/Output this shift:    General appearance: alert, cooperative and no distress Resp: clear to auscultation bilaterally GI: soft, tender Incisions are open, no further bleeding, pink and look pretty good.  few Bs.  Lab Results:   Basename 08/15/12 0600 08/14/12 0608  WBC 10.9* 13.2*  HGB 9.2* 9.4*  HCT 28.1* 28.9*  PLT 266 230    BMET  Basename 08/15/12 0600 08/14/12 0608  NA 136 134*  K 3.8 3.6  CL 103 100  CO2 26 27  GLUCOSE 105* 100*  BUN 4* 5*  CREATININE 0.54 0.69  CALCIUM 10.7* 11.2*   PT/INR No results found for this basename: LABPROT:2,INR:2 in the last 72 hours  No results found for this basename: AST:5,ALT:5,ALKPHOS:5,BILITOT:5,PROT:5,ALBUMIN:5 in the last 168 hours   Lipase     Component Value Date/Time   LIPASE 16 03/21/2012 0647     Studies/Results: No results found.  Medications:    . chlorhexidine  15 mL Mouth Rinse BID  . ertapenem  1 g Intravenous Q24H  . HYDROmorphone PCA 0.3 mg/mL   Intravenous Q4H  . lidocaine-EPINEPHrine  20 mL Other Once    Assessment/Plan radiation proctitis/diverting ileostomy; EXPLORATORY LAPAROTOMY,EXTENSIVE ENTEROLYSIS OF ADHESIONS,SMALL BOWEL RESECTION,COLOSTOMY, RIGID SIGMOIDOSCOPY 08/11/2012, Cherylynn Ridges, MD  S/p sigmoid colon resection with diverting loop ileostomy, and anastomotic break down Hx of cervical cancer  Plan:  Her NG was not  working so we have fixed that. Continue wet to dry dressing changes , walk her in the halls more.     LOS: 4 days    Kendallyn Lippold 08/15/2012

## 2012-08-15 NOTE — Progress Notes (Signed)
Agree with above Await good bowel function Wound care Amb in hall

## 2012-08-16 ENCOUNTER — Inpatient Hospital Stay (HOSPITAL_COMMUNITY): Payer: Medicaid Other

## 2012-08-16 LAB — BASIC METABOLIC PANEL
CO2: 30 mEq/L (ref 19–32)
Chloride: 96 mEq/L (ref 96–112)
Glucose, Bld: 100 mg/dL — ABNORMAL HIGH (ref 70–99)
Potassium: 4 mEq/L (ref 3.5–5.1)
Sodium: 133 mEq/L — ABNORMAL LOW (ref 135–145)

## 2012-08-16 LAB — CBC WITH DIFFERENTIAL/PLATELET
Basophils Relative: 1 % (ref 0–1)
Eosinophils Relative: 1 % (ref 0–5)
Hemoglobin: 10.3 g/dL — ABNORMAL LOW (ref 12.0–15.0)
Lymphocytes Relative: 14 % (ref 12–46)
Neutrophils Relative %: 73 % (ref 43–77)
RBC: 3.28 MIL/uL — ABNORMAL LOW (ref 3.87–5.11)

## 2012-08-16 NOTE — Progress Notes (Signed)
5 Days Post-Op  Subjective: Pain is worse today but nurse notes that she is not using her pain button  Objective: Vital signs in last 24 hours: Temp:  [97.6 F (36.4 C)-98.3 F (36.8 C)] 98.2 F (36.8 C) (11/24 0954) Pulse Rate:  [83-103] 91  (11/24 0954) Resp:  [11-20] 18  (11/24 0954) BP: (127-156)/(92-108) 156/100 mmHg (11/24 0954) SpO2:  [95 %-100 %] 100 % (11/24 0954) Last BM Date:  (PTA)  Intake/Output from previous day: 11/23 0701 - 11/24 0700 In: 1706 [I.V.:1706] Out: 550 [Emesis/NG output:550] Intake/Output this shift:    GI: very tender. dressing clean. quiet  Lab Results:   Basename 08/15/12 0600 08/14/12 0608  WBC 10.9* 13.2*  HGB 9.2* 9.4*  HCT 28.1* 28.9*  PLT 266 230   BMET  Basename 08/15/12 0600 08/14/12 0608  NA 136 134*  K 3.8 3.6  CL 103 100  CO2 26 27  GLUCOSE 105* 100*  BUN 4* 5*  CREATININE 0.54 0.69  CALCIUM 10.7* 11.2*   PT/INR No results found for this basename: LABPROT:2,INR:2 in the last 72 hours ABG No results found for this basename: PHART:2,PCO2:2,PO2:2,HCO3:2 in the last 72 hours  Studies/Results: No results found.  Anti-infectives: Anti-infectives     Start     Dose/Rate Route Frequency Ordered Stop   08/13/12 1800   ertapenem (INVANZ) 1 g in sodium chloride 0.9 % 50 mL IVPB        1 g 100 mL/hr over 30 Minutes Intravenous Every 24 hours 08/13/12 1723     08/11/12 1800   ciprofloxacin (CIPRO) IVPB 400 mg        400 mg 200 mL/hr over 60 Minutes Intravenous Every 12 hours 08/11/12 1746 08/11/12 2116   08/11/12 1800   metroNIDAZOLE (FLAGYL) IVPB 500 mg        500 mg 100 mL/hr over 60 Minutes Intravenous Every 8 hours 08/11/12 1746 08/12/12 0217   08/11/12 0600   metroNIDAZOLE (FLAGYL) IVPB 500 mg        500 mg 100 mL/hr over 60 Minutes Intravenous On call to O.R. 08/10/12 1428 08/11/12 1020   08/11/12 0600   ciprofloxacin (CIPRO) IVPB 400 mg        400 mg 200 mL/hr over 60 Minutes Intravenous On call to O.R.  08/10/12 1428 08/11/12 1020          Assessment/Plan: s/p Procedure(s) (LRB) with comments: EXPLORATORY LAPAROTOMY (N/A) LAPAROSCOPIC LYSIS OF ADHESIONS (N/A) SMALL BOWEL RESECTION (N/A) COLOSTOMY (N/A) will get abd xrays and lab work to eval abd pain ekg Work on pain control  LOS: 5 days    TOTH III,PAUL S 08/16/2012

## 2012-08-16 NOTE — Progress Notes (Signed)
Pt c/o gas pain  Unable to sleep ngt checked positive placement output brown very thick irrigated tube  Pt did not use pca pump since 6 am encouraged to use it and increase activity will monitor

## 2012-08-17 MED ORDER — HYDROMORPHONE HCL PF 1 MG/ML IJ SOLN
0.5000 mg | INTRAMUSCULAR | Status: DC | PRN
  Administered 2012-08-17: 1 mg via INTRAVENOUS
  Administered 2012-08-17 – 2012-08-19 (×14): 2 mg via INTRAVENOUS
  Administered 2012-08-20: 1 mg via INTRAVENOUS
  Administered 2012-08-20 – 2012-08-22 (×10): 2 mg via INTRAVENOUS
  Administered 2012-08-22: 1 mg via INTRAVENOUS
  Administered 2012-08-22 (×2): 2 mg via INTRAVENOUS
  Filled 2012-08-17 (×2): qty 2
  Filled 2012-08-17: qty 1
  Filled 2012-08-17 (×2): qty 2
  Filled 2012-08-17: qty 1
  Filled 2012-08-17: qty 2
  Filled 2012-08-17: qty 1
  Filled 2012-08-17 (×3): qty 2
  Filled 2012-08-17: qty 1
  Filled 2012-08-17 (×4): qty 2
  Filled 2012-08-17: qty 1
  Filled 2012-08-17 (×10): qty 2
  Filled 2012-08-17: qty 1
  Filled 2012-08-17 (×3): qty 2

## 2012-08-17 MED ORDER — FENTANYL 25 MCG/HR TD PT72
25.0000 ug | MEDICATED_PATCH | TRANSDERMAL | Status: DC
  Administered 2012-08-17 – 2012-08-26 (×4): 25 ug via TRANSDERMAL
  Filled 2012-08-17 (×4): qty 1

## 2012-08-17 NOTE — Consult Note (Signed)
Ostomy follow-up:  Pt familiar with ostomy pouching routines and pouch application and emptying.  Changed pouch and demonstrated use of barrier ring, which is new addition to her pouching since current stoma is flush with skin level.  Stoma dark red and bleeds easily, 1inch.  Applied barrier ring and 2 piece pouch.  No stool or flatus in pouch.  Pt familiar with ordering supplies when she discharges home, and supplies are present in room for staff use.  She denies further questions at this time regarding colostomy.  Cammie Mcgee, RN, MSN, [employer]  [phone]

## 2012-08-17 NOTE — Progress Notes (Signed)
GS Progress Note Subjective: Patient without complaints now.  Still has NGT in place.  Objective: Vital signs in last 24 hours: Temp:  [97.8 F (36.6 C)-98.7 F (37.1 C)] 97.9 F (36.6 C) (11/25 0623) Pulse Rate:  [85-103] 95  (11/25 0623) Resp:  [12-20] 18  (11/25 0810) BP: (128-156)/(86-100) 137/86 mmHg (11/25 0623) SpO2:  [94 %-100 %] 98 % (11/25 0810) Last BM Date:  (PTA)  Intake/Output from previous day: 08-23-23 0701 - 11/25 0700 In: 2337 [I.V.:2337] Out: 1700 [Urine:400; Emesis/NG output:1300] Intake/Output this shift: Total I/O In: -  Out: 200 [Emesis/NG output:200]  Lungs: Clear  Abd: some bowel sounds., no gaseous output from stoma, no stool output  Extremities: No DVT signs or symptoms.  Neuro: Intact  Lab Results: CBC   Basename 08/22/12 1045 08/15/12 0600  WBC 9.9 10.9*  HGB 10.3* 9.2*  HCT 30.7* 28.1*  PLT 354 266   BMET  Basename 2012-08-22 1045 08/15/12 0600  NA 133* 136  K 4.0 3.8  CL 96 103  CO2 30 26  GLUCOSE 100* 105*  BUN 3* 4*  CREATININE 0.50 0.54  CALCIUM 11.4* 10.7*   PT/INR No results found for this basename: LABPROT:2,INR:2 in the last 72 hours ABG No results found for this basename: PHART:2,PCO2:2,PO2:2,HCO3:2 in the last 72 hours  Studies/Results: Dg Abd 2 Views  Aug 22, 2012  *RADIOLOGY REPORT*  Clinical Data: Abdominal pain and status post laparotomy with small bowel resection, colostomy and lysis of adhesions.  ABDOMEN - 2 VIEW  Comparison: CT on 07/23/2012  Findings: Nasogastric tube present extending into the stomach. There is no evidence of bowel obstruction.  There are a few gas bubbles in the left upper quadrant at the level of the subphrenic space and it is unclear whether these are within the bowel or potentially extraluminal.  No rising free air is identified on the upright film.  Colostomy present in the left lower quadrant.  Clips are present related to prior cholecystectomy.  IMPRESSION: No evidence of bowel obstruction  or ileus.  Some gas bubbles in the left upper quadrant just under the diaphragm may be in bowel, such as the splenic flexure or extraluminal.  If there is any clinical concern for bowel leak or abscess, CT evaluation may be helpful.   Original Report Authenticated By: Irish Lack, M.D.     Anti-infectives: Anti-infectives     Start     Dose/Rate Route Frequency Ordered Stop   08/13/12 1800   ertapenem (INVANZ) 1 g in sodium chloride 0.9 % 50 mL IVPB        1 g 100 mL/hr over 30 Minutes Intravenous Every 24 hours 08/13/12 1723     08/11/12 1800   ciprofloxacin (CIPRO) IVPB 400 mg        400 mg 200 mL/hr over 60 Minutes Intravenous Every 12 hours 08/11/12 1746 08/11/12 2116   08/11/12 1800   metroNIDAZOLE (FLAGYL) IVPB 500 mg        500 mg 100 mL/hr over 60 Minutes Intravenous Every 8 hours 08/11/12 1746 08/12/12 0217   08/11/12 0600   metroNIDAZOLE (FLAGYL) IVPB 500 mg        500 mg 100 mL/hr over 60 Minutes Intravenous On call to O.R. 08/10/12 1428 08/11/12 1020   08/11/12 0600   ciprofloxacin (CIPRO) IVPB 400 mg        400 mg 200 mL/hr over 60 Minutes Intravenous On call to O.R. 08/10/12 1428 08/11/12 1020  Assessment/Plan: s/p Procedure(s): EXPLORATORY LAPAROTOMY LAPAROSCOPIC LYSIS OF ADHESIONS SMALL BOWEL RESECTION COLOSTOMY Clamp NGT, continue ice chips.  LOS: 6 days    Marta Lamas. [name], MD, FACS [phone] [facility] 08/17/2012

## 2012-08-17 NOTE — Progress Notes (Signed)
NG clamped at 1000 as ordered.  Patient tolerated without nausea and vomiting until 1630, when patient complained of nausea.  NG tube flushed and returned to LIS.  Will continue to monitor.

## 2012-08-18 ENCOUNTER — Inpatient Hospital Stay (HOSPITAL_COMMUNITY): Payer: Medicaid Other

## 2012-08-18 LAB — CBC WITH DIFFERENTIAL/PLATELET
Basophils Relative: 0 % (ref 0–1)
Eosinophils Relative: 3 % (ref 0–5)
Hemoglobin: 9.7 g/dL — ABNORMAL LOW (ref 12.0–15.0)
Lymphs Abs: 1.3 10*3/uL (ref 0.7–4.0)
MCH: 31.4 pg (ref 26.0–34.0)
MCV: 93.5 fL (ref 78.0–100.0)
Monocytes Absolute: 1.6 10*3/uL — ABNORMAL HIGH (ref 0.1–1.0)
Neutrophils Relative %: 73 % (ref 43–77)
RBC: 3.09 MIL/uL — ABNORMAL LOW (ref 3.87–5.11)

## 2012-08-18 NOTE — Progress Notes (Signed)
GS Progress Note Subjective: Patient hooked back up to drainage on NGT and had 1050 out.  Objective: Vital signs in last 24 hours: Temp:  [98.2 F (36.8 C)-99.5 F (37.5 C)] 99.3 F (37.4 C) (11/26 0700) Pulse Rate:  [88-100] 94  (11/26 0700) Resp:  [14-20] 14  (11/26 0700) BP: (129-138)/(88-100) 133/88 mmHg (11/26 0700) SpO2:  [97 %-100 %] 100 % (11/26 0700) Last BM Date: 08/11/12  Intake/Output from previous day: 11/25 0701 - 11/26 0700 In: 2396 [P.O.:120; I.V.:2276] Out: 1850 [Emesis/NG output:1850] Intake/Output this shift:    Lungs: Clear  Abd: soft, some bowel sounds.  Absolutely nothing out of stoma.  Probed stoma with index finger with significant discomfort, tight at the fascia level but patent.  Extremities: No DVT signs or symptoms.  Neuro: Intact  Lab Results: CBC   Basename 09-06-2012 1045  WBC 9.9  HGB 10.3*  HCT 30.7*  PLT 354   BMET  Basename 09-06-2012 1045  NA 133*  K 4.0  CL 96  CO2 30  GLUCOSE 100*  BUN 3*  CREATININE 0.50  CALCIUM 11.4*   PT/INR No results found for this basename: LABPROT:2,INR:2 in the last 72 hours ABG No results found for this basename: PHART:2,PCO2:2,PO2:2,HCO3:2 in the last 72 hours  Studies/Results: Dg Abd 2 Views  06-Sep-2012  *RADIOLOGY REPORT*  Clinical Data: Abdominal pain and status post laparotomy with small bowel resection, colostomy and lysis of adhesions.  ABDOMEN - 2 VIEW  Comparison: CT on 07/23/2012  Findings: Nasogastric tube present extending into the stomach. There is no evidence of bowel obstruction.  There are a few gas bubbles in the left upper quadrant at the level of the subphrenic space and it is unclear whether these are within the bowel or potentially extraluminal.  No rising free air is identified on the upright film.  Colostomy present in the left lower quadrant.  Clips are present related to prior cholecystectomy.  IMPRESSION: No evidence of bowel obstruction or ileus.  Some gas bubbles in the  left upper quadrant just under the diaphragm may be in bowel, such as the splenic flexure or extraluminal.  If there is any clinical concern for bowel leak or abscess, CT evaluation may be helpful.   Original Report Authenticated By: Irish Lack, M.D.     Anti-infectives: Anti-infectives     Start     Dose/Rate Route Frequency Ordered Stop   08/13/12 1800   ertapenem (INVANZ) 1 g in sodium chloride 0.9 % 50 mL IVPB        1 g 100 mL/hr over 30 Minutes Intravenous Every 24 hours 08/13/12 1723     08/11/12 1800   ciprofloxacin (CIPRO) IVPB 400 mg        400 mg 200 mL/hr over 60 Minutes Intravenous Every 12 hours 08/11/12 1746 08/11/12 2116   08/11/12 1800   metroNIDAZOLE (FLAGYL) IVPB 500 mg        500 mg 100 mL/hr over 60 Minutes Intravenous Every 8 hours 08/11/12 1746 08/12/12 0217   08/11/12 0600   metroNIDAZOLE (FLAGYL) IVPB 500 mg        500 mg 100 mL/hr over 60 Minutes Intravenous On call to O.R. 08/10/12 1428 08/11/12 1020   08/11/12 0600   ciprofloxacin (CIPRO) IVPB 400 mg        400 mg 200 mL/hr over 60 Minutes Intravenous On call to O.R. 08/10/12 1428 08/11/12 1020          Assessment/Plan: s/p Procedure(s): EXPLORATORY LAPAROTOMY LAPAROSCOPIC  LYSIS OF ADHESIONS SMALL BOWEL RESECTION COLOSTOMY Get abdominal films Check CBC today Continue NGT drainage.  LOS: 7 days    Marta Lamas. [name], MD, FACS [phone] [facility] 08/18/2012

## 2012-08-18 NOTE — Progress Notes (Signed)
The patient refused to ambulate in the halls tonight. The patient stated that she walked in the halls 3 different times during day shift.

## 2012-08-19 LAB — BASIC METABOLIC PANEL
BUN: 4 mg/dL — ABNORMAL LOW (ref 6–23)
CO2: 31 mEq/L (ref 19–32)
Chloride: 94 mEq/L — ABNORMAL LOW (ref 96–112)
Glucose, Bld: 94 mg/dL (ref 70–99)
Potassium: 4 mEq/L (ref 3.5–5.1)

## 2012-08-19 LAB — CBC WITH DIFFERENTIAL/PLATELET
Basophils Relative: 0 % (ref 0–1)
Eosinophils Relative: 4 % (ref 0–5)
Hemoglobin: 9.8 g/dL — ABNORMAL LOW (ref 12.0–15.0)
MCH: 31.3 pg (ref 26.0–34.0)
Monocytes Absolute: 1.4 10*3/uL — ABNORMAL HIGH (ref 0.1–1.0)
Monocytes Relative: 12 % (ref 3–12)
Neutrophils Relative %: 72 % (ref 43–77)
RBC: 3.13 MIL/uL — ABNORMAL LOW (ref 3.87–5.11)

## 2012-08-19 MED ORDER — FAT EMULSION 20 % IV EMUL
240.0000 mL | INTRAVENOUS | Status: AC
  Administered 2012-08-19: 240 mL via INTRAVENOUS
  Filled 2012-08-19: qty 250

## 2012-08-19 MED ORDER — POTASSIUM CHLORIDE IN NACL 20-0.9 MEQ/L-% IV SOLN
INTRAVENOUS | Status: AC
  Administered 2012-08-19: 50 mL/h via INTRAVENOUS
  Filled 2012-08-19 (×2): qty 1000

## 2012-08-19 MED ORDER — TRACE MINERALS CR-CU-F-FE-I-MN-MO-SE-ZN IV SOLN
INTRAVENOUS | Status: AC
  Administered 2012-08-19: 18:00:00 via INTRAVENOUS
  Filled 2012-08-19: qty 1000

## 2012-08-19 MED ORDER — SODIUM CHLORIDE 0.9 % IJ SOLN
10.0000 mL | INTRAMUSCULAR | Status: DC | PRN
  Administered 2012-08-20 – 2012-08-29 (×9): 10 mL

## 2012-08-19 NOTE — Progress Notes (Signed)
INITIAL ADULT NUTRITION ASSESSMENT Date: 08/19/2012   Time: 10:34 AM  Reason for Assessment: New TPN Consult; Prolonged NPO status  INTERVENTION:  TPN to meet nutrition needs while bowel rest is needed.  May not be able to meet 100% of estimated needs due to national lipid backorder.  DOCUMENTATION CODES Per approved criteria  -Not Applicable   ASSESSMENT: Female 36 y.o.  Dx: radiation proctitis/diverting ileostomy; S/P exploratory laparotomy, extensive enterolysis of adhesions, small bowel resection, colostomy, and rigid sigmoidoscopy 11/19  Hx:  Past Medical History  Diagnosis Date  . History of blood transfusion     "I had ~ 7 in 01/2012"  . Anemia   . Cervical cancer ~ 2011    cervical  . Radiation Nov.18,2011-Jan.23,2012    cervix  . Hypertension     no treatment necessary for BP  since July 2013    Past Surgical History  Procedure Date  . Radiation implants ~ 2011    "for cervical cancer"  . Colonoscopy 01/30/2012    Procedure: COLONOSCOPY;  Surgeon: Beverley Fiedler, MD;  Location: [facility] ENDOSCOPY;  Service: Gastroenterology;  Laterality: N/A;  . Colonoscopy 01/30/2012    Procedure: COLONOSCOPY;  Surgeon: Beverley Fiedler, MD;  Location: [facility] OR;  Service: Gastroenterology;  Laterality: N/A;  . Colostomy revision 02/04/2012    Procedure: COLON RESECTION SIGMOID;  Surgeon: Cherylynn Ridges, MD;  Location: [facility] OR;  Service: General;  Laterality: N/A;  . Cholecystectomy 2006  . Laparotomy 08/11/2012    Procedure: EXPLORATORY LAPAROTOMY;  Surgeon: Cherylynn Ridges, MD;  Location: [facility] OR;  Service: General;  Laterality: N/A;  . Laparoscopic lysis of adhesions 08/11/2012    Procedure: LAPAROSCOPIC LYSIS OF ADHESIONS;  Surgeon: Cherylynn Ridges, MD;  Location: [facility] OR;  Service: General;  Laterality: N/A;  . Bowel resection 08/11/2012    Procedure: SMALL BOWEL RESECTION;  Surgeon: Cherylynn Ridges, MD;  Location: [facility] OR;  Service: General;  Laterality: N/A;  . Colostomy 08/11/2012    Procedure:  COLOSTOMY;  Surgeon: Cherylynn Ridges, MD;  Location: [facility] OR;  Service: General;  Laterality: N/A;    Related Meds:  Scheduled Meds:   . chlorhexidine  15 mL Mouth Rinse BID  . ertapenem  1 g Intravenous Q24H  . fentaNYL  25 mcg Transdermal Q72H  . lidocaine-EPINEPHrine  20 mL Other Once   Continuous Infusions:   . 0.9 % NaCl with KCl 20 mEq / L    . dextrose 5 % and 0.45 % NaCl with KCl 20 mEq/L 90 mL/hr at 08/19/12 0600  . TPN (CLINIMIX) +/- additives     And  . fat emulsion     PRN Meds:.HYDROmorphone (DILAUDID) injection, silver nitrate applicators   Ht: 5\' 4"  (162.6 cm)  Wt: 143 lb 11.8 oz (65.2 kg)  Ideal Wt: 54.5 kg % Ideal Wt: 120%  Usual Wt:  Wt Readings from Last 10 Encounters:  08/11/12 143 lb 11.8 oz (65.2 kg)  08/11/12 143 lb 11.8 oz (65.2 kg)  08/07/12 139 lb 4.8 oz (63.186 kg)  07/21/12 136 lb 6.4 oz (61.871 kg)  05/26/12 120 lb 12.8 oz (54.795 kg)  04/28/12 107 lb 8 oz (48.762 kg)  04/17/12 112 lb 6 oz (50.973 kg)  03/21/12 143 lb 11.2 oz (65.182 kg)  03/17/12 143 lb 11.2 oz (65.182 kg)  03/03/12 150 lb 4.8 oz (68.176 kg)   % Usual Wt: 105%  Body mass index is 24.67 kg/(m^2).  Food/Nutrition Related Hx: No nutrition problems  identified on admission nutrition screen.  Labs:  CMP     Component Value Date/Time   NA 133* 08/19/2012 0600   K 4.0 08/19/2012 0600   CL 94* 08/19/2012 0600   CO2 31 08/19/2012 0600   GLUCOSE 94 08/19/2012 0600   BUN 4* 08/19/2012 0600   CREATININE 0.49* 08/19/2012 0600   CALCIUM 12.1* 08/19/2012 0600   CALCIUM 10.7* 02/01/2012 0705   PROT 7.5 08/07/2012 1437   ALBUMIN 3.6 08/07/2012 1437   AST 14 08/07/2012 1437   ALT 9 08/07/2012 1437   ALKPHOS 74 08/07/2012 1437   BILITOT 0.2* 08/07/2012 1437   GFRNONAA >90 08/19/2012 0600   GFRAA >90 08/19/2012 0600    CBG (last 3)  No results found for this basename: GLUCAP:3 in the last 72 hours    Intake/Output Summary (Last 24 hours) at 08/19/12 1039 Last data filed  at 08/19/12 0930  Gross per 24 hour  Intake 2576.5 ml  Output   4000 ml  Net -1423.5 ml    Diet Order: NPO  Patient to begin receiving TPN today with Clinimix E 5/15 @ 40 ml/hr.  Lipids (20% IVFE @ 10 ml/hr), multivitamins, and trace elements are provided 3 times weekly (MWF) due to national backorder.  Provides 887 kcal and 48 grams protein daily (based on weekly average).  Meets 49% minimum estimated kcal and 62% minimum estimated protein needs.  IVF:    0.9 % NaCl with KCl 20 mEq / L   dextrose 5 % and 0.45 % NaCl with KCl 20 mEq/L Last Rate: 90 mL/hr at 08/19/12 0600  TPN (CLINIMIX) +/- additives   And   fat emulsion     Estimated Nutritional Needs:   Kcal: 1800-1950 Protein: 80-95 gm Fluid: 1.8-2 L  Patient to begin TPN today for prolonged ileus.  Patient is at nutrition risk given prolonged NPO status (8 days since admission to hospital).  PICC being placed today for administration of TPN.  NUTRITION DIAGNOSIS: Inadequate oral intake related to altered GI function as evidenced by prolonged NPO status.  MONITORING/EVALUATION(Goals): Goal:  Intake to meet >90% of estimated nutrition needs. Monitor:  Diet advancement, TPN tolerance, labs, weight trend.  EDUCATION NEEDS: -Education not appropriate at this time   Joaquin Courts, RD, LDN, CNSC Pager# [phone] After Hours Pager# [phone]  08/19/2012, 10:34 AM

## 2012-08-19 NOTE — Progress Notes (Signed)
Peripherally Inserted Central Catheter/Midline Placement  The IV Nurse has discussed with the patient and/or persons authorized to consent for the patient, the purpose of this procedure and the potential benefits and risks involved with this procedure.  The benefits include less needle sticks, lab draws from the catheter and patient may be discharged home with the catheter.  Risks include, but not limited to, infection, bleeding, blood clot (thrombus formation), and puncture of an artery; nerve damage and irregular heat beat.  Alternatives to this procedure were also discussed.  PICC/Midline Placement Documentation        Lisabeth Devoid 08/19/2012, 4:20 PM Consent obtained by Lazarus Gowda, RN

## 2012-08-19 NOTE — Progress Notes (Signed)
PARENTERAL NUTRITION CONSULT NOTE - INITIAL  Pharmacy Consult for TPN Indication: Prolonged ileus following exp lap, LOA, SBR and colostomy on 11/19  Allergies  Allergen Reactions  . Penicillins Other (See Comments)    "anything w/penicillin in it makes me bleed"  . Labetalol Hcl Itching and Other (See Comments)    Bumps to bilateral arms.  . Morphine And Related Hives    Patient Measurements: Height: 5\' 4"  (162.6 cm) Weight: 143 lb 11.8 oz (65.2 kg) IBW/kg (Calculated) : 54.7   Vital Signs: Temp: 98.5 F (36.9 C) (11/27 0536) Temp src: Oral (11/26 2135) BP: 122/86 mmHg (11/27 0536) Pulse Rate: 94  (11/27 0536) Intake/Output from previous day: 11/26 0701 - 11/27 0700 In: 2576.5 [P.O.:240; I.V.:2086.5; IV Piggyback:250] Out: 4000 [Urine:950; Emesis/NG output:3050] Intake/Output from this shift:    Labs:  Basename 08/19/12 0600 08/18/12 0920 08/16/12 1045  WBC 11.3* 12.2* 9.9  HGB 9.8* 9.7* 10.3*  HCT 29.1* 28.9* 30.7*  PLT 550* 453* 354  APTT -- -- --  INR -- -- --     Basename 08/19/12 0600 08/16/12 1045  NA 133* 133*  K 4.0 4.0  CL 94* 96  CO2 31 30  GLUCOSE 94 100*  BUN 4* 3*  CREATININE 0.49* 0.50  LABCREA -- --  CREAT24HRUR -- --  CALCIUM 12.1* 11.4*  MG -- --  PHOS -- --  PROT -- --  ALBUMIN -- --  AST -- --  ALT -- --  ALKPHOS -- --  BILITOT -- --  BILIDIR -- --  IBILI -- --  PREALBUMIN -- --  TRIG -- --  CHOLHDL -- --  CHOL -- --   Estimated Creatinine Clearance: 83.9 ml/min (by C-G formula based on Cr of 0.49).   No results found for this basename: GLUCAP:3 in the last 72 hours  Medical History: Past Medical History  Diagnosis Date  . History of blood transfusion     "I had ~ 7 in 01/2012"  . Anemia   . Cervical cancer ~ 2011    cervical  . Radiation Nov.18,2011-Jan.23,2012    cervix  . Hypertension     no treatment necessary for BP  since July 2013    Medications:  Prescriptions prior to admission  Medication Sig  Dispense Refill  . oxyCODONE-acetaminophen (PERCOCET) 10-325 MG per tablet Take 1 tablet by mouth every 4 (four) hours as needed. For pain      . pantoprazole (PROTONIX) 40 MG tablet Take 1 tablet (40 mg total) by mouth daily at 12 noon.  30 tablet  3  . prochlorperazine (COMPAZINE) 5 MG tablet Take 1 tablet (5 mg total) by mouth 4 (four) times daily -  before meals and at bedtime.  30 tablet  1    Insulin Requirements in the past 24 hours:  No SSI on board  Current Nutrition:  NPO since 11/19  Nutritional Goals:  Estimated:  1750  kCal, 90 grams of protein per day- pending RD assessment.  Assessment: Ms. Heitzenrater has a hx of radiation proctitis/diverting ileostomy, now s/p ex lap woth LOA, SBR and R colostomy.  GI: Noted patient has been NPO since 11/19, NGT in place, now with increasing output, suspicious for prolonged ileus. Baseline albumin was 3.6 depicting good chronic nutritional state. Suspect this may have changed post-op and d/t NPO, will get clearer picture of this with prealbumin. Suspect refeeding risk. Endo: No CBG checks, no prior hx DM/DM meds. Noted glucose on BMETs have been controlled. Lytes: Na  low, persists, K nml, corrected Ca 12.4- no Mg/Phos checks since admit. Noted on D5.45NS with 20K at 90cc/hr. Metabolic alkalosis noted. Renal: Scr nml, UOP good at 0.6 + some unmeasured urine.  Pulm: RA, stable Cards: VSS Hepatobil: LFTs nml at baseline, no prealbumin check this admit. Neuro: nml per MD assessment. ID:  Day#5 Invanz, empirically for elevated WBC. Pt remains afebrile. No micro data.  Best Practices: SCDs (d/c H/H dropping and bleeding from sx incision) TPN Access: Pending PICC placement, ordered as "urgent" TPN day#: 11/27>>   Plan:  - Start Clinimix E 5/15 at 17ml/hr tonight, if PICC is placed. Will plan to advanced to 75 cc/hr to attempt to meet 100% protein needs and 85% of Kcal needs. Will await RD recs for more accurate assessment of needs. - Will  supplement MVI/trace elements and IV fats on MWF d/t national shortages. - Will change IVF to NS with 20 K at 50cc/hr at 1800 tonight.  - Will start SSI and q4h CBG checks- if CBGs controlled at goal TPN, will d/c SSI and CBG checks and if not controlled, will use this as a guide for insulin supplementation. - Will check TPN labs in AM  Thanks, Elice Crigger K. Allena Katz, PharmD, BCPS.  Clinical Pharmacist Pager [phone]. 08/19/2012 10:01 AM

## 2012-08-19 NOTE — Progress Notes (Signed)
GS Progress Note Subjective: Patient is wondering why NGT output has gone up.  I am wondering the same thing.  Objective: Vital signs in last 24 hours: Temp:  [98.3 F (36.8 C)-98.5 F (36.9 C)] 98.5 F (36.9 C) (11/27 0536) Pulse Rate:  [84-94] 94  (11/27 0536) Resp:  [17-18] 17  (11/27 0536) BP: (122-132)/(86-91) 122/86 mmHg (11/27 0536) SpO2:  [99 %-100 %] 99 % (11/27 0536) Last BM Date: 08/11/12  Intake/Output from previous day: 11/26 0701 - 11/27 0700 In: 2576.5 [P.O.:240; I.V.:2086.5; IV Piggyback:250] Out: 4000 [Urine:950; Emesis/NG output:3050] Intake/Output this shift:    Lungs: Clear.  Sats are okay.  Abd: Soft, much less tender than yesterday.  No gas in stoma, no stool  Extremities: No DVT signs or symptoms  Neuro: Intact  Lab Results: CBC   Basename 08/19/12 0600 08/18/12 0920  WBC 11.3* 12.2*  HGB 9.8* 9.7*  HCT 29.1* 28.9*  PLT 550* 453*   BMET  Basename 08/19/12 0600 08/16/12 1045  NA 133* 133*  K 4.0 4.0  CL 94* 96  CO2 31 30  GLUCOSE 94 100*  BUN 4* 3*  CREATININE 0.49* 0.50  CALCIUM 12.1* 11.4*   PT/INR No results found for this basename: LABPROT:2,INR:2 in the last 72 hours ABG No results found for this basename: PHART:2,PCO2:2,PO2:2,HCO3:2 in the last 72 hours  Studies/Results: Dg Abd 1 View  08/18/2012  *RADIOLOGY REPORT*  Clinical Data: Status post colostomy 1 week ago, no output to evaluate for obstruction  ABDOMEN - 1 VIEW  Comparison: Prior abdominal radiograph 08/16/2012; prior abdominal CT 07/23/2012  Findings: Nonspecific, nonobstructed bowel gas pattern.  Formed stool and gas identified within the right and transverse colons. No air-filled, or distended small or large bowel identified.  The shadow of an ostomy and surrounding ostomy bag project over the left hemiabdomen.  Incompletely imaged nasogastric tube.  The tip of the tube projects over the expected location of the gastric fundus.  Surgical clips in the right upper  quadrant suggest prior cholecystectomy.  No acute osseous abnormality.  IMPRESSION:  1.  Nonobstructed bowel gas pattern. 2.  Left mid abdomen ostomy. 3.  Surgical changes of prior cholecystectomy.   Original Report Authenticated By: Malachy Moan, M.D.     Anti-infectives: Anti-infectives     Start     Dose/Rate Route Frequency Ordered Stop   08/13/12 1800   ertapenem (INVANZ) 1 g in sodium chloride 0.9 % 50 mL IVPB        1 g 100 mL/hr over 30 Minutes Intravenous Every 24 hours 08/13/12 1723     08/11/12 1800   ciprofloxacin (CIPRO) IVPB 400 mg        400 mg 200 mL/hr over 60 Minutes Intravenous Every 12 hours 08/11/12 1746 08/11/12 2116   08/11/12 1800   metroNIDAZOLE (FLAGYL) IVPB 500 mg        500 mg 100 mL/hr over 60 Minutes Intravenous Every 8 hours 08/11/12 1746 08/12/12 0217   08/11/12 0600   metroNIDAZOLE (FLAGYL) IVPB 500 mg        500 mg 100 mL/hr over 60 Minutes Intravenous On call to O.R. 08/10/12 1428 08/11/12 1020   08/11/12 0600   ciprofloxacin (CIPRO) IVPB 400 mg        400 mg 200 mL/hr over 60 Minutes Intravenous On call to O.R. 08/10/12 1428 08/11/12 1020          Assessment/Plan: s/p Procedure(s): EXPLORATORY LAPAROTOMY LAPAROSCOPIC LYSIS OF ADHESIONS SMALL BOWEL RESECTION COLOSTOMY Willneed  to continue NGT until output decreases. In the meantime, patient needs nutrition.  Will start PICC and TPN per pharmacy. Check CBC again in the AM May need UGI study or study through stoma.  LOS: 8 days    Marta Lamas. [name], MD, FACS [phone] [facility] 08/19/2012

## 2012-08-20 LAB — COMPREHENSIVE METABOLIC PANEL
AST: 32 U/L (ref 0–37)
Albumin: 2.9 g/dL — ABNORMAL LOW (ref 3.5–5.2)
BUN: 7 mg/dL (ref 6–23)
CO2: 32 mEq/L (ref 19–32)
Calcium: 12.9 mg/dL — ABNORMAL HIGH (ref 8.4–10.5)
Creatinine, Ser: 0.49 mg/dL — ABNORMAL LOW (ref 0.50–1.10)
GFR calc non Af Amer: >90 mL/min (ref >90)
Total Bilirubin: 0.4 mg/dL (ref 0.3–1.2)

## 2012-08-20 LAB — TRIGLYCERIDES: Triglycerides: 131 mg/dL (ref <150)

## 2012-08-20 LAB — CBC WITH DIFFERENTIAL/PLATELET
Basophils Absolute: 0 10*3/uL (ref 0.0–0.1)
Eosinophils Absolute: 0.5 10*3/uL (ref 0.0–0.7)
Lymphocytes Relative: 12 % (ref 12–46)
MCH: 30.3 pg (ref 26.0–34.0)
MCHC: 33 g/dL (ref 30.0–36.0)
Monocytes Absolute: 1.1 10*3/uL — ABNORMAL HIGH (ref 0.1–1.0)
Neutro Abs: 8.4 10*3/uL — ABNORMAL HIGH (ref 1.7–7.7)
Neutrophils Relative %: 74 % (ref 43–77)
RDW: 14 % (ref 11.5–15.5)

## 2012-08-20 LAB — PREALBUMIN: Prealbumin: 12.3 mg/dL — ABNORMAL LOW (ref 17.0–34.0)

## 2012-08-20 MED ORDER — PANTOPRAZOLE SODIUM 40 MG IV SOLR
40.0000 mg | INTRAVENOUS | Status: DC
  Administered 2012-08-20 – 2012-08-26 (×7): 40 mg via INTRAVENOUS
  Filled 2012-08-20 (×8): qty 40

## 2012-08-20 MED ORDER — ENOXAPARIN SODIUM 30 MG/0.3ML ~~LOC~~ SOLN
30.0000 mg | SUBCUTANEOUS | Status: DC
  Administered 2012-08-20 – 2012-08-21 (×2): 30 mg via SUBCUTANEOUS
  Filled 2012-08-20 (×3): qty 0.3

## 2012-08-20 MED ORDER — CLINIMIX E/DEXTROSE (5/15) 5 % IV SOLN
INTRAVENOUS | Status: AC
  Administered 2012-08-20: 17:00:00 via INTRAVENOUS
  Filled 2012-08-20: qty 2000

## 2012-08-20 MED ORDER — POTASSIUM CHLORIDE IN NACL 20-0.9 MEQ/L-% IV SOLN
INTRAVENOUS | Status: AC
  Administered 2012-08-20 – 2012-08-21 (×2): via INTRAVENOUS
  Filled 2012-08-20 (×2): qty 1000

## 2012-08-20 NOTE — Progress Notes (Signed)
PARENTERAL NUTRITION CONSULT NOTE - FOLLOW UP  Pharmacy Consult for TPN Indication: Prolonged ileus following exp lap, LOA, SBR and colostomy on 11/19  Allergies  Allergen Reactions  . Penicillins Other (See Comments)    "anything w/penicillin in it makes me bleed"  . Labetalol Hcl Itching and Other (See Comments)    Bumps to bilateral arms.  . Morphine And Related Hives    Patient Measurements: Height: 5\' 4"  (162.6 cm) Weight: 143 lb 11.8 oz (65.2 kg) IBW/kg (Calculated) : 54.7   Vital Signs: Temp: 98.1 F (36.7 C) (11/28 0622) Temp src: Oral (11/28 0622) BP: 122/71 mmHg (11/28 0622) Pulse Rate: 86  (11/28 0622) Intake/Output from previous day: 11/27 0701 - 11/28 0700 In: 1942 [I.V.:600; NG/GT:750; TPN:592] Out: 1600 [Emesis/NG output:1600] Intake/Output from this shift:    Labs:  [place] 08/20/12 0443 08/19/12 0600 08/18/12 0920  WBC 11.4* 11.3* 12.2*  HGB 9.1* 9.8* 9.7*  HCT 27.6* 29.1* 28.9*  PLT 580* 550* 453*  APTT -- -- --  INR -- -- --     Basename 08/20/12 0443 08/19/12 0600  NA 136 133*  K 4.1 4.0  CL 96 94*  CO2 32 31  GLUCOSE 108* 94  BUN 7 4*  CREATININE 0.49* 0.49*  LABCREA -- --  CREAT24HRUR -- --  CALCIUM 12.9* 12.1*  MG 2.2 --  PHOS 3.2 --  PROT 8.1 --  ALBUMIN 2.9* --  AST 32 --  ALT 23 --  ALKPHOS 149* --  BILITOT 0.4 --  BILIDIR -- --  IBILI -- --  PREALBUMIN -- --  TRIG 131 --  CHOLHDL -- --  CHOL 101 --   Estimated Creatinine Clearance: 83.9 ml/min (by C-G formula based on Cr of 0.49).   No results found for this basename: GLUCAP:3 in the last 72 hours  Medications:  Scheduled:    . chlorhexidine  15 mL Mouth Rinse BID  . ertapenem  1 g Intravenous Q24H  . fentaNYL  25 mcg Transdermal Q72H  . lidocaine-EPINEPHrine  20 mL Other Once    Insulin Requirements in the past 24 hours:  None since TPN start  Current Nutrition:  NPO + TPN at ~50% goal  Nutritional Goals per RD assessment:  1800-1950 kCal, 80-95  grams of protein per day, 1.8-2 L/day  Assessment: Ms. Dobos has a hx of radiation proctitis/diverting ileostomy, now s/p ex lap woth LOA, SBR and R colostomy (11/19).   GI: Noted patient has been NPO since 11/19, NGT in place, had 3 L out 11/26, now with 1.9 L in the last 24h. Noted pt had small brb via NGT last PM, but not ongoing. Noted albumin now down to 2.9, patient's labs do not depict refeeding syndrome. Appreciate RD assessment of nutritional needs. Endo: CBG controlled, no SSI needed, no prior hx DM/DM meds.   LytesJudieth Keens are nml today s/p TPN start. Corrected Ca high at 13.78- Ca x Phos= 44, goal is <50. Clinimix provides 4.5 Meq/L. MIVF with NS20K at 50cc/hr. Metabolic alkalosis resolving, likely attributable to Cl loss via NGT as well.  Renal: Scr nml, UOP not measured.  Pulm: RA, stable  Cards: VSS  Hepatobil: LFTs nml, trig are nml.  Neuro: nml per MD assessment.  ID: Day#6 Invanz, empirically for elevated WBC. Pt remains afebrile. No micro data.  Best Practices: SCDs (d/c H/H dropping and bleeding from sx incision)  TPN Access: PICC placed 11/27 TPN day#: 11/27  Plan:  - Increase Clinimix E 5/15 to goal  rate of 80 ml/hr tonight. Plans is to attempt to meet 100% protein needs and 88% of Kcal needs.  - Will assess Ca in AM, and assess need to omit electrolytes from TPN temporarily. - Will supplement MVI/trace elements and IV fats on MWF d/t national shortages.  - Will change IVF to NS with 20 K at 20cc/hr at 1800 tonight.  - Will continue SSI and q4h CBG checks- if CBGs remain controlled at goal TPN, will d/c SSI and CBG in AM  - Will check BMET, Mg and Phos in AM   Thanks, Madalaine Portier K. Allena Katz, PharmD, BCPS.  Clinical Pharmacist Pager [phone]. 08/20/2012 8:18 AM

## 2012-08-20 NOTE — Progress Notes (Addendum)
9 Days Post-Op  Subjective: Still with very high ng tube output. No output or air in ostomy bag.   Objective: Vital signs in last 24 hours: Temp:  [98.1 F (36.7 C)-98.8 F (37.1 C)] 98.1 F (36.7 C) (11/28 0622) Pulse Rate:  [86-93] 86  (11/28 0622) Resp:  [18-20] 20  (11/28 0622) BP: (122-133)/(71-91) 122/71 mmHg (11/28 0622) SpO2:  [96 %-100 %] 96 % (11/28 0622) Last BM Date: 08/11/12  Intake/Output from previous day: 11/27 0701 - 11/28 0700 In: 1942 [I.V.:600; NG/GT:750; TPN:592] Out: 1600 [Emesis/NG output:1600] Intake/Output this shift:    Alert, soft spoken, nad cta ant Soft, nd. Ostomy viable - no air in bag Ng - bilious  Lab Results:   Basename 08/20/12 0443 08/19/12 0600  WBC 11.4* 11.3*  HGB 9.1* 9.8*  HCT 27.6* 29.1*  PLT 580* 550*   BMET  Basename 08/20/12 0443 08/19/12 0600  NA 136 133*  K 4.1 4.0  CL 96 94*  CO2 32 31  GLUCOSE 108* 94  BUN 7 4*  CREATININE 0.49* 0.49*  CALCIUM 12.9* 12.1*   PT/INR No results found for this basename: LABPROT:2,INR:2 in the last 72 hours ABG No results found for this basename: PHART:2,PCO2:2,PO2:2,HCO3:2 in the last 72 hours  Studies/Results: Dg Abd 1 View  08/18/2012  *RADIOLOGY REPORT*  Clinical Data: Status post colostomy 1 week ago, no output to evaluate for obstruction  ABDOMEN - 1 VIEW  Comparison: Prior abdominal radiograph 08/16/2012; prior abdominal CT 07/23/2012  Findings: Nonspecific, nonobstructed bowel gas pattern.  Formed stool and gas identified within the right and transverse colons. No air-filled, or distended small or large bowel identified.  The shadow of an ostomy and surrounding ostomy bag project over the left hemiabdomen.  Incompletely imaged nasogastric tube.  The tip of the tube projects over the expected location of the gastric fundus.  Surgical clips in the right upper quadrant suggest prior cholecystectomy.  No acute osseous abnormality.  IMPRESSION:  1.  Nonobstructed bowel gas pattern.  2.  Left mid abdomen ostomy. 3.  Surgical changes of prior cholecystectomy.   Original Report Authenticated By: Malachy Moan, M.D.     Anti-infectives: Anti-infectives     Start     Dose/Rate Route Frequency Ordered Stop   08/13/12 1800   ertapenem (INVANZ) 1 g in sodium chloride 0.9 % 50 mL IVPB        1 g 100 mL/hr over 30 Minutes Intravenous Every 24 hours 08/13/12 1723     08/11/12 1800   ciprofloxacin (CIPRO) IVPB 400 mg        400 mg 200 mL/hr over 60 Minutes Intravenous Every 12 hours 08/11/12 1746 08/11/12 2116   08/11/12 1800   metroNIDAZOLE (FLAGYL) IVPB 500 mg        500 mg 100 mL/hr over 60 Minutes Intravenous Every 8 hours 08/11/12 1746 08/12/12 0217   08/11/12 0600   metroNIDAZOLE (FLAGYL) IVPB 500 mg        500 mg 100 mL/hr over 60 Minutes Intravenous On call to O.R. 08/10/12 1428 08/11/12 1020   08/11/12 0600   ciprofloxacin (CIPRO) IVPB 400 mg        400 mg 200 mL/hr over 60 Minutes Intravenous On call to O.R. 08/10/12 1428 08/11/12 1020          Assessment/Plan: s/p Procedure(s) (LRB) with comments: EXPLORATORY LAPAROTOMY (N/A) LAPAROSCOPIC LYSIS OF ADHESIONS (N/A) SMALL BOWEL RESECTION (N/A) COLOSTOMY (N/A)  Postop ileus - high ng tube output - will  monitor for now, if persists - CT or UGI PCMN - cont TPN Anemia - hgb stable Will restart lovenox - low dose; pt apparently had bleeding from incision a few days ago and lovenox was held Ambulate  [name]. [name], MD, FACS General, Bariatric, & Minimally Invasive Surgery [facility], [place]   LOS: 9 days    Atilano Ina 08/20/2012

## 2012-08-21 ENCOUNTER — Inpatient Hospital Stay (HOSPITAL_COMMUNITY): Payer: Medicaid Other

## 2012-08-21 LAB — BASIC METABOLIC PANEL
BUN: 14 mg/dL (ref 6–23)
Chloride: 99 mEq/L (ref 96–112)
GFR calc Af Amer: >90 mL/min (ref >90)
GFR calc non Af Amer: >90 mL/min (ref >90)
Potassium: 4.4 mEq/L (ref 3.5–5.1)
Sodium: 136 mEq/L (ref 135–145)

## 2012-08-21 LAB — CBC
Hemoglobin: 8.8 g/dL — ABNORMAL LOW (ref 12.0–15.0)
MCH: 30.2 pg (ref 26.0–34.0)
Platelets: 603 10*3/uL — ABNORMAL HIGH (ref 150–400)
RBC: 2.91 MIL/uL — ABNORMAL LOW (ref 3.87–5.11)
WBC: 10.9 10*3/uL — ABNORMAL HIGH (ref 4.0–10.5)

## 2012-08-21 LAB — PHOSPHORUS: Phosphorus: 3.4 mg/dL (ref 2.3–4.6)

## 2012-08-21 MED ORDER — FAT EMULSION 20 % IV EMUL
240.0000 mL | INTRAVENOUS | Status: AC
  Administered 2012-08-21: 240 mL via INTRAVENOUS
  Filled 2012-08-21: qty 250

## 2012-08-21 MED ORDER — ENOXAPARIN SODIUM 30 MG/0.3ML ~~LOC~~ SOLN: 30.0000 mg | SUBCUTANEOUS | Status: DC

## 2012-08-21 MED ORDER — TRACE MINERALS CR-CU-F-FE-I-MN-MO-SE-ZN IV SOLN
INTRAVENOUS | Status: AC
  Administered 2012-08-21: 17:00:00 via INTRAVENOUS
  Filled 2012-08-21: qty 2000

## 2012-08-21 MED ORDER — IOHEXOL 300 MG/ML  SOLN
80.0000 mL | Freq: Once | INTRAMUSCULAR | Status: AC | PRN
  Administered 2012-08-21: 80 mL via INTRAVENOUS

## 2012-08-21 NOTE — Progress Notes (Addendum)
Soft, nd. Ostomy viable but nothing in bag.  Still with hi ng tube output Reports trace blood in ng hgb stable  Check CT abd/pelvis Will make determination of cessation of abx based on CT results.  Monitor hgb - will stop lovenox Monitor Ca - take ca out of TPN  Vanessa [name]. [name], MD, FACS General, Bariatric, & Minimally Invasive Surgery [facility], [place]

## 2012-08-21 NOTE — Progress Notes (Signed)
Patient ID: Vanessa [name], female   DOB: 04/19/1976, 36 y.o.   MRN: [id number] 10 Days Post-Op  Subjective: Pt feels ok.  Still not putting anything out in her bag.  NGT started having BRB again this morning.  Just started.  Not a large amount.  Objective: Vital signs in last 24 hours: Temp:  [97.5 F (36.4 C)-98.2 F (36.8 C)] 98.1 F (36.7 C) (11/29 0615) Pulse Rate:  [75-99] 87  (11/29 0615) Resp:  [16-18] 17  (11/29 0615) BP: (121-144)/(76-85) 121/76 mmHg (11/29 0615) SpO2:  [99 %-100 %] 100 % (11/29 0615) Last BM Date: 08/11/12  Intake/Output from previous day: 11/28 0701 - 11/29 0700 In: 1735 [I.V.:1175; TPN:560] Out: 2452 [Urine:600; Emesis/NG output:1250] Intake/Output this shift:    PE: Abd: soft, incision covered.  Ostomy with no output at all.  Stoma is viable.  NGT with bright red blood, small amount  Lab Results:   Basename 08/21/12 0640 08/20/12 0443  WBC 10.9* 11.4*  HGB 8.8* 9.1*  HCT 27.1* 27.6*  PLT 603* 580*   BMET  Basename 08/21/12 0640 08/20/12 0443  NA 136 136  K 4.4 4.1  CL 99 96  CO2 30 32  GLUCOSE 125* 108*  BUN 14 7  CREATININE 0.54 0.49*  CALCIUM 13.1* 12.9*   PT/INR No results found for this basename: LABPROT:2,INR:2 in the last 72 hours CMP     Component Value Date/Time   NA 136 08/21/2012 0640   K 4.4 08/21/2012 0640   CL 99 08/21/2012 0640   CO2 30 08/21/2012 0640   GLUCOSE 125* 08/21/2012 0640   BUN 14 08/21/2012 0640   CREATININE 0.54 08/21/2012 0640   CALCIUM 13.1* 08/21/2012 0640   CALCIUM 10.7* 02/01/2012 0705   PROT 8.1 08/20/2012 0443   ALBUMIN 2.9* 08/20/2012 0443   AST 32 08/20/2012 0443   ALT 23 08/20/2012 0443   ALKPHOS 149* 08/20/2012 0443   BILITOT 0.4 08/20/2012 0443   GFRNONAA >90 08/21/2012 0640   GFRAA >90 08/21/2012 0640   Lipase     Component Value Date/Time   LIPASE 16 03/21/2012 0647       Studies/Results: No results found.  Anti-infectives: Anti-infectives     Start     Dose/Rate  Route Frequency Ordered Stop   08/13/12 1800   ertapenem (INVANZ) 1 g in sodium chloride 0.9 % 50 mL IVPB        1 g 100 mL/hr over 30 Minutes Intravenous Every 24 hours 08/13/12 1723     08/11/12 1800   ciprofloxacin (CIPRO) IVPB 400 mg        400 mg 200 mL/hr over 60 Minutes Intravenous Every 12 hours 08/11/12 1746 08/11/12 2116   08/11/12 1800   metroNIDAZOLE (FLAGYL) IVPB 500 mg        500 mg 100 mL/hr over 60 Minutes Intravenous Every 8 hours 08/11/12 1746 08/12/12 0217   08/11/12 0600   metroNIDAZOLE (FLAGYL) IVPB 500 mg        500 mg 100 mL/hr over 60 Minutes Intravenous On call to O.R. 08/10/12 1428 08/11/12 1020   08/11/12 0600   ciprofloxacin (CIPRO) IVPB 400 mg        400 mg 200 mL/hr over 60 Minutes Intravenous On call to O.R. 08/10/12 1428 08/11/12 1020           Assessment/Plan  1. S/p ileostomy takedown with placement of end permanent colostomy 2. Prolonged post op ileus 3. Hypercalcemia  Plan: 1. Will get a   CT scan today to evaluate reason for increasing NGT output and no ostomy output. 2. Pharmacy has taken out the electrolytes from her TNA today.  Will follow her calcium level.    LOS: 10 days    Macoy Rodwell E 08/21/2012, 8:55 AM Pager: [phone]  

## 2012-08-21 NOTE — Progress Notes (Signed)
PARENTERAL NUTRITION CONSULT NOTE - FOLLOW UP  Pharmacy Consult for TPN Indication: Prolonged ileus following exp lap, LOA, SBR and colostomy on 11/19  Allergies  Allergen Reactions  . Penicillins Other (See Comments)    "anything w/penicillin in it makes me bleed"  . Labetalol Hcl Itching and Other (See Comments)    Bumps to bilateral arms.  . Morphine And Related Hives    Patient Measurements: Height: 5\' 4"  (162.6 cm) Weight: 143 lb 11.8 oz (65.2 kg) IBW/kg (Calculated) : 54.7   Vital Signs: Temp: 98.1 F (36.7 C) (11/29 0615) Temp src: Oral (11/29 0615) BP: 121/76 mmHg (11/29 0615) Pulse Rate: 87  (11/29 0615) Intake/Output from previous day: 11/28 0701 - 11/29 0700 In: 1735 [I.V.:1175; TPN:560] Out: 2452 [Urine:600; Emesis/NG output:1250] Intake/Output from this shift:    Labs:  Vanessa [name] 08/21/12 0640 08/20/12 0443 08/19/12 0600  WBC 10.9* 11.4* 11.3*  HGB 8.8* 9.1* 9.8*  HCT 27.1* 27.6* 29.1*  PLT 603* 580* 550*  APTT -- -- --  INR -- -- --     Basename 08/21/12 0640 08/20/12 0443 08/19/12 0600  NA 136 136 133*  K 4.4 4.1 4.0  CL 99 96 94*  CO2 30 32 31  GLUCOSE 125* 108* 94  BUN 14 7 4*  CREATININE 0.54 0.49* 0.49*  LABCREA -- -- --  CREAT24HRUR -- -- --  CALCIUM 13.1* 12.9* 12.1*  MG -- 2.2 --  PHOS 3.4 3.2 --  PROT -- 8.1 --  ALBUMIN -- 2.9* --  AST -- 32 --  ALT -- 23 --  ALKPHOS -- 149* --  BILITOT -- 0.4 --  BILIDIR -- -- --  IBILI -- -- --  PREALBUMIN -- 12.3* --  TRIG -- 131 --  CHOLHDL -- -- --  CHOL -- 101 --   Estimated Creatinine Clearance: 83.9 ml/min (by C-G formula based on Cr of 0.54).   No results found for this basename: GLUCAP:3 in the last 72 hours  Medications:  Scheduled:     . chlorhexidine  15 mL Mouth Rinse BID  . enoxaparin (LOVENOX) injection  30 mg Subcutaneous Q24H  . ertapenem  1 g Intravenous Q24H  . fentaNYL  25 mcg Transdermal Q72H  . lidocaine-EPINEPHrine  20 mL Other Once  . 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   GI: Noted patient has been NPO since 11/19, NGT in place with large volume output, ~1.25L in the last 24 hours. Noted pt had small brb via NGT 11/27, but not ongoing. Noted albumin now down to 2.9, patient's labs do not depict refeeding syndrome. Appreciate RD assessment of nutritional needs - Prealbumin = 12.3 Endo: Serum CBG controlled, no SSI needed, no prior hx DM/DM meds.   LytesJudieth [name] are WNL except corrected Ca high at 13.98- Ca x Phos= 47.5, goal is <50. Clinimix provides 4.5 Meq/L. MIVF with NS20K at [facility] - lytes trending up Renal: Scr WNL, UOP 0.64ml/kg/hr Pulm: RA, stable  Cards: VSS  Hepatobil: LFTs nml except AlkPhos slightly elevated, trig are nml.  Neuro: nml per MD assessment.  ID: Day#7 Invanz, empirically for elevated  WBC. Pt remains afebrile. No micro data.  Best Practices: lovenox, MC, PPI TPN Access: PICC placed 11/27 TPN day#: 3 (11/27>> )  Plan:  - Change TPN to clinimix 5/15 but continue at goal rate of 80ml/hr (provides 96gm/day protein>>100% goal + average of 1569 kcal/day>>87% goal) - Will supplement MVI/trace elements and IV fats on MWF d/t Publishing copy.  - Will check BMET, Mg and Phos in AM to assess trend and need to add electrolytes back to TPN  Vanessa [name], PharmD, BCPS Pager # [phone] 08/21/2012 8:36 AM

## 2012-08-21 NOTE — Progress Notes (Signed)
CRITICAL VALUE ALERT  Critical value received:  Calcium  Date of notification:  08/21/12  Time of notification: 0745  Critical value read back:yes  Nurse who received alert:  Bonna Gains RN  MD notified (1st page):  Dr. [name]  Time of first page:  0750  MD notified (2nd page):Kelly Earl Gala PA  Time of second page:0820  Responding MD:  Barnetta Chapel PA  Time MD responded:  [phone]

## 2012-08-21 NOTE — H&P (View-Only) (Signed)
Patient ID: Vanessa [name], female   DOB: Jan 19, 1976, 36 y.o.   MRN: [id number] 10 Days Post-Op  Subjective: Pt feels ok.  Still not putting anything out in her bag.  NGT started having BRB again this morning.  Just started.  Not a large amount.  Objective: Vital signs in last 24 hours: Temp:  [97.5 F (36.4 C)-98.2 F (36.8 C)] 98.1 F (36.7 C) (11/29 0615) Pulse Rate:  [75-99] 87  (11/29 0615) Resp:  [16-18] 17  (11/29 0615) BP: (121-144)/(76-85) 121/76 mmHg (11/29 0615) SpO2:  [99 %-100 %] 100 % (11/29 0615) Last BM Date: 08/11/12  Intake/Output from previous day: 11/28 0701 - 11/29 0700 In: 1735 [I.V.:1175; TPN:560] Out: 2452 [Urine:600; Emesis/NG output:1250] Intake/Output this shift:    PE: Abd: soft, incision covered.  Ostomy with no output at all.  Stoma is viable.  NGT with bright red blood, small amount  Lab Results:   Basename 08/21/12 0640 08/20/12 0443  WBC 10.9* 11.4*  HGB 8.8* 9.1*  HCT 27.1* 27.6*  PLT 603* 580*   BMET  Basename 08/21/12 0640 08/20/12 0443  NA 136 136  K 4.4 4.1  CL 99 96  CO2 30 32  GLUCOSE 125* 108*  BUN 14 7  CREATININE 0.54 0.49*  CALCIUM 13.1* 12.9*   PT/INR No results found for this basename: LABPROT:2,INR:2 in the last 72 hours CMP     Component Value Date/Time   NA 136 08/21/2012 0640   K 4.4 08/21/2012 0640   CL 99 08/21/2012 0640   CO2 30 08/21/2012 0640   GLUCOSE 125* 08/21/2012 0640   BUN 14 08/21/2012 0640   CREATININE 0.54 08/21/2012 0640   CALCIUM 13.1* 08/21/2012 0640   CALCIUM 10.7* 02/01/2012 0705   PROT 8.1 08/20/2012 0443   ALBUMIN 2.9* 08/20/2012 0443   AST 32 08/20/2012 0443   ALT 23 08/20/2012 0443   ALKPHOS 149* 08/20/2012 0443   BILITOT 0.4 08/20/2012 0443   GFRNONAA >90 08/21/2012 0640   GFRAA >90 08/21/2012 0640   Lipase     Component Value Date/Time   LIPASE 16 03/21/2012 0647       Studies/Results: No results found.  Anti-infectives: Anti-infectives     Start     Dose/Rate  Route Frequency Ordered Stop   08/13/12 1800   ertapenem (INVANZ) 1 g in sodium chloride 0.9 % 50 mL IVPB        1 g 100 mL/hr over 30 Minutes Intravenous Every 24 hours 08/13/12 1723     08/11/12 1800   ciprofloxacin (CIPRO) IVPB 400 mg        400 mg 200 mL/hr over 60 Minutes Intravenous Every 12 hours 08/11/12 1746 08/11/12 2116   08/11/12 1800   metroNIDAZOLE (FLAGYL) IVPB 500 mg        500 mg 100 mL/hr over 60 Minutes Intravenous Every 8 hours 08/11/12 1746 08/12/12 0217   08/11/12 0600   metroNIDAZOLE (FLAGYL) IVPB 500 mg        500 mg 100 mL/hr over 60 Minutes Intravenous On call to O.R. 08/10/12 1428 08/11/12 1020   08/11/12 0600   ciprofloxacin (CIPRO) IVPB 400 mg        400 mg 200 mL/hr over 60 Minutes Intravenous On call to O.R. 08/10/12 1428 08/11/12 1020           Assessment/Plan  1. S/p ileostomy takedown with placement of end permanent colostomy 2. Prolonged post op ileus 3. Hypercalcemia  Plan: 1. Will get a  CT scan today to evaluate reason for increasing NGT output and no ostomy output. 2. Pharmacy has taken out the electrolytes from her TNA today.  Will follow her calcium level.    LOS: 10 days    Shadana Pry E 08/21/2012, 8:55 AM Pager: [phone]

## 2012-08-21 NOTE — Interval H&P Note (Cosign Needed)
History and Physical Interval Note: CT scan reviewed with Dr. Archer Asa after [facility] called.  Patient with pelvic collection post op amendable to percutaneous drainage.   08/21/2012 4:30 PM  Vanessa [name]  has presented today for surgery, with the diagnosis of post operative fluid collection.   The various methods of treatment have been discussed with the patient.  After consideration of risks, benefits and other options for treatment, the patient has consented to  Percutaneous drainage of abdominal/pelvic fluid collection as a surgical intervention .  The patient's history has been reviewed, patient examined, no change in status, stable for surgery.  I have reviewed the patient's chart and labs.  Questions were answered to the patient's satisfaction.  New labs are ordered for the am prior to procedure to ensure stability of labs prior to proceeding.    Jael Waldorf D, PA-C Dr. Malachy Moan Interventional Radiology

## 2012-08-22 ENCOUNTER — Inpatient Hospital Stay (HOSPITAL_COMMUNITY): Payer: Medicaid Other

## 2012-08-22 LAB — PROTIME-INR
INR: 1.15 (ref 0.00–1.49)
Prothrombin Time: 14.5 seconds (ref 11.6–15.2)

## 2012-08-22 LAB — BASIC METABOLIC PANEL
CO2: 29 mEq/L (ref 19–32)
Calcium: 13.2 mg/dL (ref 8.4–10.5)
Chloride: 98 mEq/L (ref 96–112)
Glucose, Bld: 106 mg/dL — ABNORMAL HIGH (ref 70–99)
Potassium: 4.2 mEq/L (ref 3.5–5.1)
Sodium: 135 mEq/L (ref 135–145)

## 2012-08-22 LAB — CBC
HCT: 28.1 % — ABNORMAL LOW (ref 36.0–46.0)
Hemoglobin: 9.3 g/dL — ABNORMAL LOW (ref 12.0–15.0)
MCHC: 33.1 g/dL (ref 30.0–36.0)
RBC: 2.97 MIL/uL — ABNORMAL LOW (ref 3.87–5.11)
WBC: 11.6 10*3/uL — ABNORMAL HIGH (ref 4.0–10.5)

## 2012-08-22 LAB — MAGNESIUM: Magnesium: 1.9 mg/dL (ref 1.5–2.5)

## 2012-08-22 LAB — PHOSPHORUS: Phosphorus: 2.7 mg/dL (ref 2.3–4.6)

## 2012-08-22 LAB — CALCIUM, IONIZED: Calcium, Ion: 1.87 mmol/L (ref 1.12–1.32)

## 2012-08-22 MED ORDER — FENTANYL CITRATE 0.05 MG/ML IJ SOLN
INTRAMUSCULAR | Status: AC
  Filled 2012-08-22: qty 4

## 2012-08-22 MED ORDER — HYDROMORPHONE HCL PF 1 MG/ML IJ SOLN
2.0000 mg | INTRAMUSCULAR | Status: DC | PRN
  Administered 2012-08-22 – 2012-08-28 (×36): 2 mg via INTRAVENOUS
  Administered 2012-08-28: 1 mg via INTRAVENOUS
  Administered 2012-08-28 – 2012-08-29 (×5): 2 mg via INTRAVENOUS
  Filled 2012-08-22 (×3): qty 2
  Filled 2012-08-22: qty 1
  Filled 2012-08-22 (×12): qty 2
  Filled 2012-08-22: qty 1
  Filled 2012-08-22 (×16): qty 2
  Filled 2012-08-22: qty 1
  Filled 2012-08-22 (×9): qty 2

## 2012-08-22 MED ORDER — FENTANYL CITRATE 0.05 MG/ML IJ SOLN
INTRAMUSCULAR | Status: AC | PRN
  Administered 2012-08-22 (×4): 50 ug via INTRAVENOUS

## 2012-08-22 MED ORDER — MIDAZOLAM HCL 2 MG/2ML IJ SOLN
INTRAMUSCULAR | Status: AC
  Filled 2012-08-22: qty 4

## 2012-08-22 MED ORDER — MIDAZOLAM HCL 2 MG/2ML IJ SOLN
INTRAMUSCULAR | Status: AC | PRN
  Administered 2012-08-22: 2 mg via INTRAVENOUS

## 2012-08-22 MED ORDER — CLINIMIX/DEXTROSE (5/15) 5 % IV SOLN
INTRAVENOUS | Status: AC
  Administered 2012-08-22: 17:00:00 via INTRAVENOUS
  Filled 2012-08-22: qty 2000

## 2012-08-22 MED ORDER — POTASSIUM CHLORIDE IN NACL 20-0.9 MEQ/L-% IV SOLN
INTRAVENOUS | Status: DC
  Administered 2012-08-22 – 2012-08-26 (×5): via INTRAVENOUS
  Filled 2012-08-22 (×5): qty 1000

## 2012-08-22 MED ORDER — MAGNESIUM SULFATE 40 MG/ML IJ SOLN
2.0000 g | Freq: Once | INTRAMUSCULAR | Status: AC
  Administered 2012-08-22: 2 g via INTRAVENOUS
  Filled 2012-08-22: qty 50

## 2012-08-22 NOTE — Progress Notes (Signed)
11 Days Post-Op  Subjective: Ct showed large intra-abd fluid collection and air. Pt exam stable and no peritonitis. Made decision for perc drain for which pt had earlier this am. Nothing in ostomy bag. Still with hyperCa  Objective: Vital signs in last 24 hours: Temp:  [97.9 F (36.6 C)-98.3 F (36.8 C)] 98.3 F (36.8 C) (11/30 1212) Pulse Rate:  [77-97] 86  (11/30 1212) Resp:  [12-20] 18  (11/30 1212) BP: (106-123)/(68-87) 106/74 mmHg (11/30 1212) SpO2:  [100 %] 100 % (11/30 1212) Last BM Date: 08/11/12  Intake/Output from previous day: 11/29 0701 - 11/30 0700 In: 2243 [P.O.:60; I.V.:160; IV Piggyback:60; TPN:1963] Out: 1450 [Emesis/NG output:1450] Intake/Output this shift:    Alert, nad cta  Reg  Flat, nd. Ostomy viable but nothing in bag Drain - old blood  Lab Results:   [facility] 08/22/12 0628 08/21/12 0640  WBC 11.6* 10.9*  HGB 9.3* 8.8*  HCT 28.1* 27.1*  PLT 621* 603*   BMET  Basename 08/22/12 0628 08/21/12 0640  NA 135 136  K 4.2 4.4  CL 98 99  CO2 29 30  GLUCOSE 106* 125*  BUN 17 14  CREATININE 0.51 0.54  CALCIUM 13.2* 13.1*   PT/INR  Basename 08/22/12 0628  LABPROT 14.5  INR 1.15   ABG No results found for this basename: PHART:2,PCO2:2,PO2:2,HCO3:2 in the last 72 hours  Studies/Results: Ct Abdomen Pelvis W Contrast  08/21/2012  *RADIOLOGY REPORT*  Clinical Data: 36 year old female with a history of prior sigmoid colon resection and diverting loop ileostomy now status post ileostomy takedown and creation of a permanent colostomy.  She continues to have prolonged ileus with no output from her colostomy and relatively high output from her NGT.  CT ABDOMEN AND PELVIS WITH CONTRAST  Technique:  Multidetector CT imaging of the abdomen and pelvis was performed following the standard protocol during bolus administration of intravenous contrast.  Contrast: 80mL OMNIPAQUE IOHEXOL 300 MG/ML  SOLN  Comparison: Most recent CT scan abdomen/pelvis 07/23/2012   Findings:  Lower Chest:  Mild elevation of the left hemidiaphragm with resultant subsegmental atelectasis in the left lower lobe. Additionally there is dependent linear atelectasis in the right lower lobe. Visualized cardiac structures within normal limits for size.  No pericardial effusion.  Unremarkable visualized thoracic esophagus carrying a nasogastric tube into the stomach.  Abdomen: Interval development of a loculated peripherally enhancing complex fluid collection in the region of the prior left pericolic gutter.  The fluid collection begins at the level of the inferior pole of the left kidney and extends inferiorly along the anterior pararenal fascia before extending across the midline into the mesentery.  Here, the fluid surrounds the enteral enteric anastomosis and extends anteriorly to the anterior abdominal wall. The greatest dimensions of the left pericolic paracolic gutter collection measure 5.4 x 2.8 x 9.8 cm. The portion of the collection extending from the left pericolic gutter medially into the small bowel mesentery measures approximately 6.9 x 2.1 cm and is relatively high in attenuation with Hounsfield units in the mid to upper 50s.  A lower attenuation fluid collection adjacent to the ileal surgical staple line appears contiguous with the other fluid collections and extends anteriorly toward the anterior abdominal wall.  This portion of the complex collection measures approximately 5.3 x 3.1 cm and is of intermediate attenuation.  Additionally, there is a focal left subdiaphragmatic fluid collection containing several locules of air.  This collection measures 8.3 x 2.8 by up to 2.2 cm and is positioned between  the gastric fundus and the diaphragm.  Free intraperitoneal air is noted in the mid epigastric region anterior to the left hepatic lobe.  There are a few other small scattered locules of air.  An isolated locule of gas deep in the anatomic pelvis may also be extra luminal.  The stomach is  distended with oral contrast material as are multiple loops of proximal jejunum.  There is focal thickening of the first portion of the jejunum where it is in close approximation with the left pericolic fluid collection.  The contrast material becomes diluted as progresses more distally within the bowel. There is no focal transition point.  Otherwise, unremarkable CT appearance of the spleen, liver, kidneys and adrenal glands.  The patient is status post cholecystectomy. New left lower quadrant end colostomy.  Air is noted within the bladder lumen.  Bones: No acute fracture or aggressive appearing lytic or blastic osseous lesion.  Vascular: No focal abnormality.  IMPRESSION:  1.  CT findings are consistent with development of intra-abdominal abscess which appears to be originating around the ileal enteral enteric anastomosis in the deep anatomic pelvis.  The fluid is complex with areas of high attenuation which may represent extravasating oral contrast material (signifying anastomotic leak), or blood products.  The fluid collection extends from the region of the anastomosis anteriorly toward the anterior abdominal wall and also extends across the abdomen into the left pericolic gutter where it ascends to the level of the left lower pole of the kidney. This collection would be amenable to CT guided percutaneous drainage.  2.  Presumed reactive ileus with dilatation of the stomach and proximal small bowel.  There are areas of focal bowel wall thickening where the bowel comes in close contact with the above described fluid collection.  3.  There is a separate loculated fluid and gas collection in the left subdiaphragmatic space interposition between the diaphragm and the dilated fundus of the stomach.  Currently, this fluid collection is not amenable to CT guided drainage without transgressing either gastric wall or pleura.  4.  Free intraperitoneal air anterior to the left hepatic lobe and locules of gas within the  left subdiaphragmatic fluid collection may be residual from the patient's prior operation although this is slightly more than would be expected given the interval.  The gas does not appear to connect with the peri anastomotic fluid collection.  There is a single locule of free air deep within the anatomic pelvis on image 69 which may be extraluminal.  5.  Interval surgical changes including take down of the prior diverting ileostomy with creation of an ileal-ileal enteric anastomosis in the midline anatomic pelvis and a left end colostomy.  The patient is status post prior left hemicolectomy.  6.  Mild elevation of the left hemidiaphragm and left lower lobe atelectasis may be reactive and secondary to the adjacent left subdiaphragmatic fluid collection.  7.  Air within the bladder lumen.  Recommend correlation with history of recent instrumentation.  Infection with a gas-forming organism could have a similar appearance.  These results were called by telephone on 08/21/2012 at 02:35 p.m. to Barnetta Chapel, PA, who verbally acknowledged these results.   Original Report Authenticated By: Malachy Moan, M.D.     Anti-infectives: Anti-infectives     Start     Dose/Rate Route Frequency Ordered Stop   08/13/12 1800   ertapenem (INVANZ) 1 g in sodium chloride 0.9 % 50 mL IVPB        1 g 100  mL/hr over 30 Minutes Intravenous Every 24 hours 08/13/12 1723     08/11/12 1800   ciprofloxacin (CIPRO) IVPB 400 mg        400 mg 200 mL/hr over 60 Minutes Intravenous Every 12 hours 08/11/12 1746 08/11/12 2116   08/11/12 1800   metroNIDAZOLE (FLAGYL) IVPB 500 mg        500 mg 100 mL/hr over 60 Minutes Intravenous Every 8 hours 08/11/12 1746 08/12/12 0217   08/11/12 0600   metroNIDAZOLE (FLAGYL) IVPB 500 mg        500 mg 100 mL/hr over 60 Minutes Intravenous On call to O.R. 08/10/12 1428 08/11/12 1020   08/11/12 0600   ciprofloxacin (CIPRO) IVPB 400 mg        400 mg 200 mL/hr over 60 Minutes Intravenous On  call to O.R. 08/10/12 1428 08/11/12 1020          Assessment/Plan: s/p Procedure(s) (LRB) with comments: EXPLORATORY LAPAROTOMY (N/A) LAPAROSCOPIC LYSIS OF ADHESIONS (N/A) SMALL BOWEL RESECTION (N/A) COLOSTOMY (N/A)   Cont invanz until fluid collection micro comes back Npo, ng, TPN hyperCa - cont to monitor, make sure no Ca in TPN. If cont to rise, will consult medicine; appears asymptomatic Stopped lovenox secondary to blood tinged ng tube.   [name]. [name], MD, FACS General, Bariatric, & Minimally Invasive Surgery [facility], [place]   LOS: 11 days    Atilano Ina 08/22/2012

## 2012-08-22 NOTE — Progress Notes (Signed)
PARENTERAL NUTRITION CONSULT NOTE - FOLLOW UP  Pharmacy Consult for TPN Indication: Prolonged ileus following exp lap, LOA, SBR and colostomy on 11/19  Allergies  Allergen Reactions  . Penicillins Other (See Comments)    "anything w/penicillin in it makes me bleed"  . Labetalol Hcl Itching and Other (See Comments)    Bumps to bilateral arms.  . Morphine And Related Hives    Patient Measurements: Height: 5\' 4"  (162.6 cm) Weight: 143 lb 11.8 oz (65.2 kg) IBW/kg (Calculated) : 54.7   Vital Signs: Temp: 98 F (36.7 C) (11/30 0518) BP: 121/76 mmHg (11/30 0518) Pulse Rate: 89  (11/30 0518) Intake/Output from previous day: 11/29 0701 - 11/30 0700 In: 2243 [P.O.:60; I.V.:160; IV Piggyback:60; TPN:1963] Out: 1450 [Emesis/NG output:1450] Intake/Output from this shift:    Labs:  [facility] 08/22/12 0628 08/21/12 0640 08/20/12 0443  WBC 11.6* 10.9* 11.4*  HGB 9.3* 8.8* 9.1*  HCT 28.1* 27.1* 27.6*  PLT 621* 603* 580*  APTT -- -- --  INR 1.15 -- --     Basename 08/22/12 0628 08/21/12 0640 08/20/12 0443  NA 135 136 136  K 4.2 4.4 4.1  CL 98 99 96  CO2 29 30 32  GLUCOSE 106* 125* 108*  BUN 17 14 7   CREATININE 0.51 0.54 0.49*  LABCREA -- -- --  CREAT24HRUR -- -- --  CALCIUM 13.2* 13.1* 12.9*  MG 1.9 -- 2.2  PHOS 2.7 3.4 3.2  PROT -- -- 8.1  ALBUMIN -- -- 2.9*  AST -- -- 32  ALT -- -- 23  ALKPHOS -- -- 149*  BILITOT -- -- 0.4  BILIDIR -- -- --  IBILI -- -- --  PREALBUMIN -- -- 12.3*  TRIG -- -- 131  CHOLHDL -- -- --  CHOL -- -- 101   Estimated Creatinine Clearance: 83.9 ml/min (by C-G formula based on Cr of 0.51).   No results found for this basename: GLUCAP:3 in the last 72 hours  Medications:  Scheduled:     . chlorhexidine  15 mL Mouth Rinse BID  . ertapenem  1 g Intravenous Q24H  . fentaNYL  25 mcg Transdermal Q72H  . fentaNYL      . lidocaine-EPINEPHrine  20 mL Other Once  . midazolam      . pantoprazole (PROTONIX) IV  40 mg Intravenous Q24H  .  [DISCONTINUED] enoxaparin (LOVENOX) injection  30 mg Subcutaneous Q24H  . 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   GI: Noted patient has been NPO since 11/19, NGT in place with large volume output, ~1.5L in the last 24 hours. Noted pt had small brb via NGT 11/27, then again yesterday AM. Noted albumin now down to 2.9, patient's labs do not depict refeeding syndrome. Appreciate RD assessment of nutritional needs - Prealbumin = 12.3. CT on 11/29 reported with intra-abdominal abscess with fluid likely to be anastomotic leak, ileus, free airNow s/p percutaneous drainage of abdominal/pelvic fluid Endo: Serum CBGs controlled, no SSI needed, no prior hx DM/DM meds.   LytesJudieth [name] are WNL except corrected Ca high at 14.08- Ca x Phos= 38, goal is <50. MIVF  with NS20K at [facility]  - All electrolytes were removed from TPN yesterday due to hypercalcemia Renal: Scr WNL, UOP not documented Pulm: RA, stable  Cards: VSS  Hepatobil: LFTs nml except AlkPhos slightly elevated, trig are nml.  Neuro: nml per MD assessment.  ID: Day#8 Invanz, empirically for elevated WBC. Pt remains afebrile. No micro data.  Best Practices: MC, PPI TPN Access: PICC placed 11/27 TPN day#: 4 (11/27>> )  Plan:  - Continue clinimix 5/15 at goal rate of 63ml/hr (provides 96gm/day protein>>100% goal + average of 1569 kcal/day>>87% goal) - Will supplement MVI/trace elements and IV fats on MWF d/t Publishing copy.  - Will check BMET, Mg and Phos in AM to assess trend and need to add electrolytes back to TPN - Give magnesium 2gm IV x 1  Vanessa [name], PharmD, BCPS Pager # [phone] 08/22/2012 9:07 AM

## 2012-08-22 NOTE — Procedures (Signed)
Interventional Radiology Procedure Note  Procedure: CT guided placement of a 24F drain into the LLQ loculated peritoneal fluid collection.  50 mL of thick, dark gelatinous blood was aspirated c/w old hematoma.  Sample sent to micro for culture.  Complications: None Recommendations: - Maintain tube to bulb suction - Recommend flushing with 10 mL saline or water ar least Q shift and PRN  Signed,  Sterling Big, MD Vascular & Interventional Radiologist [facility] Radiology

## 2012-08-23 LAB — BASIC METABOLIC PANEL
CO2: 27 mEq/L (ref 19–32)
Chloride: 99 mEq/L (ref 96–112)
Creatinine, Ser: 0.5 mg/dL (ref 0.50–1.10)
GFR calc Af Amer: >90 mL/min (ref >90)
Potassium: 4.4 mEq/L (ref 3.5–5.1)
Sodium: 132 mEq/L — ABNORMAL LOW (ref 135–145)

## 2012-08-23 LAB — PHOSPHORUS: Phosphorus: 2.4 mg/dL (ref 2.3–4.6)

## 2012-08-23 LAB — MAGNESIUM: Magnesium: 1.9 mg/dL (ref 1.5–2.5)

## 2012-08-23 MED ORDER — MAGNESIUM SULFATE 40 MG/ML IJ SOLN
2.0000 g | Freq: Once | INTRAMUSCULAR | Status: AC
  Administered 2012-08-23: 2 g via INTRAVENOUS
  Filled 2012-08-23: qty 50

## 2012-08-23 MED ORDER — CLINIMIX/DEXTROSE (5/15) 5 % IV SOLN
INTRAVENOUS | Status: AC
  Administered 2012-08-23: 17:00:00 via INTRAVENOUS
  Filled 2012-08-23: qty 2000

## 2012-08-23 NOTE — Progress Notes (Signed)
Soft, flat. osotmy viable, no air Ng output down some Drain -serosang  Cont current plan as below F/u drain culture, cont iv abx for now HyperCa- ca down some, repeat in am.    Vanessa [name]. [name], MD, FACS General, Bariatric, & Minimally Invasive Surgery [facility], [place]

## 2012-08-23 NOTE — Progress Notes (Signed)
12 Days Post-Op  Subjective: Pt doing ok; has some soreness at drain site LLQ  Objective: Vital signs in last 24 hours: Temp:  [98 F (36.7 C)-98.6 F (37 C)] 98.6 F (37 C) (12/01 0546) Pulse Rate:  [77-97] 87  (12/01 0546) Resp:  [12-20] 18  (12/01 0546) BP: (103-115)/(68-83) 103/73 mmHg (12/01 0546) SpO2:  [100 %] 100 % (12/01 0546) Last BM Date: 08/11/12  Intake/Output from previous day: 11/30 0701 - 12/01 0700 In: 3386.2 [I.V.:1328.8; IV Piggyback:50; QMV:7846.9] Out: 2060 [Urine:1100; Emesis/NG output:900; Drains:60] Intake/Output this shift:    LLQ drain intact , insertion site ok, mildly tender, output 60 cc's bloody fluid; cx's pend  Lab Results:   [facility] 08/22/12 0628 08/21/12 0640  WBC 11.6* 10.9*  HGB 9.3* 8.8*  HCT 28.1* 27.1*  PLT 621* 603*   BMET  Basename 08/23/12 0615 08/22/12 0628  NA 132* 135  K 4.4 4.2  CL 99 98  CO2 27 29  GLUCOSE 113* 106*  BUN 16 17  CREATININE 0.50 0.51  CALCIUM 12.8* 13.2*   PT/INR  Basename 08/22/12 0628  LABPROT 14.5  INR 1.15   ABG No results found for this basename: PHART:2,PCO2:2,PO2:2,HCO3:2 in the last 72 hours Results for orders placed during the hospital encounter of 08/11/12  CULTURE, ROUTINE-ABSCESS     Status: Normal (Preliminary result)   Collection Time   08/22/12 10:39 AM      Component Value Range Status Comment   Specimen Description ABSCESS   Final    Special Requests POST OPERATIVE HEMATOMA   Final    Gram Stain     Final    Value: MODERATE WBC PRESENT, PREDOMINANTLY PMN     NO SQUAMOUS EPITHELIAL CELLS SEEN     NO ORGANISMS SEEN   Culture NO GROWTH 1 DAY   Final    Report Status PENDING   Incomplete     Studies/Results: Ct Guided Abscess Drain  08/22/2012  *RADIOLOGY REPORT*  CT GUIDED ABSCESS DRAIN  Date: 08/22/2012  Clinical History: 36 year old female with a very complicated surgical history.  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  She has experienced little output from her colostomy and copious output, and G tube.  A recent CT scan demonstrated a complex fluid collection in the left pericolic gutter, and around the surgical anastomosis with a secondary reactive ileus.  CT guided abscess drain placement is requested.  Procedures Performed: 1. CT guided drain placement  Interventional Radiologist:  Sterling Big, MD  Sedation: Moderate (conscious) sedation was used.  To mg Versed, 200 mcg Fentanyl were administered intravenously.  The patient's vital signs were monitored continuously by radiology nursing throughout the procedure.  Sedation Time: 40 minutes  Fluoroscopy time: 8 seconds  Contrast volume: None  PROCEDURE/FINDINGS:   Informed consent was obtained from the patient following explanation of the procedure, risks, benefits and alternatives. The patient understands, agrees and consents for the procedure. All questions were addressed. A time out was performed.  Maximal barrier sterile technique utilized including caps, mask, sterile gowns, sterile gloves, large sterile drape, hand hygiene, and betadine skin prep.  A planning axial CT scan was performed.  The left pericolic gutter fluid collection was successfully identified an appropriate skin entry site marked.  Local anesthesia was achieved by infiltration of 1% lidocaine.  Lidocaine infiltration was carried deeper through the abdominal muscles.  Under CT fluoroscopic guidance, and 18 gauge trocar  needle was carefully advanced into the fluid collection.  A 0.035-inch wire was then advanced into the fluid collection.  The wire progressed inferiorly and medially as expected.  The tract was then serially dilated to 14-French.  A 14 French Cook all-purpose drainage catheter modified with additional side holes was then advanced over the wire.  The pigtail was formed and approximately 60 ml of thick, dark red gelatinous blood was successfully  aspirated.  The catheter was then gently flushed with saline and connected to suction bulb drainage.  Post drain placement axial CT imaging confirmed location of the drain within the left hemi abdominal fluid collection.  The fluid collection was decreased in size.  The catheter was then secured to the skin with 0-prolene suture and an adhesive fixator.  The patient tolerated the procedure well, there is no immediate complication.  This sample of aspirated hematoma will be sent to micro for culture.  IMPRESSION:  1.  Technically successful placement of a 14-French all-purpose drainage catheter modified with additional side holes into the left abdominal complex fluid collection.  Approximately 60 ml of old hematoma was successfully aspirated.  This will be sent for culture.  2.  Recommend maintaining drainage catheter to bulb suction with flushes at least Q- shift to facilitate drainage of this presumed post-operative hematoma.  Signed,  Sterling Big, MD Vascular & Interventional Radiologist [facility] Radiology   Original Report Authenticated By: Malachy Moan, M.D.    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  She continues to have prolonged ileus with no output from her colostomy and relatively high output from her NGT.  CT ABDOMEN AND PELVIS WITH CONTRAST  Technique:  Multidetector CT imaging of the abdomen and pelvis was performed following the standard protocol during bolus administration of intravenous contrast.  Contrast: 80mL OMNIPAQUE IOHEXOL 300 MG/ML  SOLN  Comparison: Most recent CT scan abdomen/pelvis 07/23/2012  Findings:  Lower Chest:  Mild elevation of the left hemidiaphragm with resultant subsegmental atelectasis in the left lower lobe. Additionally there is dependent linear  atelectasis in the right lower lobe. Visualized cardiac structures within normal limits for size.  No pericardial effusion.  Unremarkable visualized thoracic esophagus carrying a nasogastric tube into the stomach.  Abdomen: Interval development of a loculated peripherally enhancing complex fluid collection in the region of the prior left pericolic gutter.  The fluid collection begins at the level of the inferior pole of the left kidney and extends inferiorly along the anterior pararenal fascia before extending across the midline into the mesentery.  Here, the fluid surrounds the enteral enteric anastomosis and extends anteriorly to the anterior abdominal wall. The greatest dimensions of the left pericolic paracolic gutter collection measure 5.4 x 2.8 x 9.8 cm. The portion of the collection extending from the left pericolic gutter medially into the small bowel mesentery measures approximately 6.9 x 2.1 cm and is relatively high in attenuation with Hounsfield units in the mid to upper 50s.  A lower attenuation fluid collection adjacent to the ileal surgical staple line appears contiguous with the other fluid collections and extends anteriorly toward the anterior abdominal wall.  This portion of the complex collection measures approximately 5.3 x 3.1 cm and is of intermediate attenuation.  Additionally, there is a focal left subdiaphragmatic fluid collection containing several locules of air.  This  collection measures 8.3 x 2.8 by up to 2.2 cm and is positioned between the gastric fundus and the diaphragm.  Free intraperitoneal air is noted in the mid epigastric region anterior to the left hepatic lobe.  There are a few other small scattered locules of air.  An isolated locule of gas deep in the anatomic pelvis may also be extra luminal.  The stomach is distended with oral contrast material as are multiple loops of proximal jejunum.  There is focal thickening of the first portion of the jejunum where it is in close  approximation with the left pericolic fluid collection.  The contrast material becomes diluted as progresses more distally within the bowel. There is no focal transition point.  Otherwise, unremarkable CT appearance of the spleen, liver, kidneys and adrenal glands.  The patient is status post cholecystectomy. New left lower quadrant end colostomy.  Air is noted within the bladder lumen.  Bones: No acute fracture or aggressive appearing lytic or blastic osseous lesion.  Vascular: No focal abnormality.  IMPRESSION:  1.  CT findings are consistent with development of intra-abdominal abscess which appears to be originating around the ileal enteral enteric anastomosis in the deep anatomic pelvis.  The fluid is complex with areas of high attenuation which may represent extravasating oral contrast material (signifying anastomotic leak), or blood products.  The fluid collection extends from the region of the anastomosis anteriorly toward the anterior abdominal wall and also extends across the abdomen into the left pericolic gutter where it ascends to the level of the left lower pole of the kidney. This collection would be amenable to CT guided percutaneous drainage.  2.  Presumed reactive ileus with dilatation of the stomach and proximal small bowel.  There are areas of focal bowel wall thickening where the bowel comes in close contact with the above described fluid collection.  3.  There is a separate loculated fluid and gas collection in the left subdiaphragmatic space interposition between the diaphragm and the dilated fundus of the stomach.  Currently, this fluid collection is not amenable to CT guided drainage without transgressing either gastric wall or pleura.  4.  Free intraperitoneal air anterior to the left hepatic lobe and locules of gas within the left subdiaphragmatic fluid collection may be residual from the patient's prior operation although this is slightly more than would be expected given the interval.   The gas does not appear to connect with the peri anastomotic fluid collection.  There is a single locule of free air deep within the anatomic pelvis on image 69 which may be extraluminal.  5.  Interval surgical changes including take down of the prior diverting ileostomy with creation of an ileal-ileal enteric anastomosis in the midline anatomic pelvis and a left end colostomy.  The patient is status post prior left hemicolectomy.  6.  Mild elevation of the left hemidiaphragm and left lower lobe atelectasis may be reactive and secondary to the adjacent left subdiaphragmatic fluid collection.  7.  Air within the bladder lumen.  Recommend correlation with history of recent instrumentation.  Infection with a gas-forming organism could have a similar appearance.  These results were called by telephone on 08/21/2012 at 02:35 p.m. to Barnetta Chapel, PA, who verbally acknowledged these results.   Original Report Authenticated By: Malachy Moan, M.D.     Anti-infectives: Anti-infectives     Start     Dose/Rate Route Frequency Ordered Stop   08/13/12 1800   ertapenem (INVANZ) 1 g in sodium chloride 0.9 %  50 mL IVPB        1 g 100 mL/hr over 30 Minutes Intravenous Every 24 hours 08/13/12 1723     08/11/12 1800   ciprofloxacin (CIPRO) IVPB 400 mg        400 mg 200 mL/hr over 60 Minutes Intravenous Every 12 hours 08/11/12 1746 08/11/12 2116   08/11/12 1800   metroNIDAZOLE (FLAGYL) IVPB 500 mg        500 mg 100 mL/hr over 60 Minutes Intravenous Every 8 hours 08/11/12 1746 08/12/12 0217   08/11/12 0600   metroNIDAZOLE (FLAGYL) IVPB 500 mg        500 mg 100 mL/hr over 60 Minutes Intravenous On call to O.R. 08/10/12 1428 08/11/12 1020   08/11/12 0600   ciprofloxacin (CIPRO) IVPB 400 mg        400 mg 200 mL/hr over 60 Minutes Intravenous On call to O.R. 08/10/12 1428 08/11/12 1020          Assessment/Plan: s/p left abd fluid coll/hematoma drainage 11/30; check final cx's, monitor labs, cont drain  flushes with sterile NS; other plans as per [facility].   LOS: 12 days    Nayib Remer,D [facility] 08/23/2012

## 2012-08-23 NOTE — Progress Notes (Signed)
PARENTERAL NUTRITION CONSULT NOTE - FOLLOW UP  Pharmacy Consult for TPN Indication: Prolonged ileus following exp lap, LOA, SBR and colostomy on 11/19  Allergies  Allergen Reactions  . Penicillins Other (See Comments)    "anything w/penicillin in it makes me bleed"  . Labetalol Hcl Itching and Other (See Comments)    Bumps to bilateral arms.  . Morphine And Related Hives    Patient Measurements: Height: 5\' 4"  (162.6 cm) Weight: 143 lb 11.8 oz (65.2 kg) IBW/kg (Calculated) : 54.7   Vital Signs: Temp: 98.6 F (37 C) (12/01 0546) Temp src: Oral (12/01 0546) BP: 103/73 mmHg (12/01 0546) Pulse Rate: 87  (12/01 0546) Intake/Output from previous day: 11/30 0701 - 12/01 0700 In: 3386.2 [I.V.:1328.8; IV Piggyback:50; WUJ:8119.1] Out: 2060 [Urine:1100; Emesis/NG output:900; Drains:60] Intake/Output from this shift:    Labs:  [facility] 08/22/12 0628 08/21/12 0640  WBC 11.6* 10.9*  HGB 9.3* 8.8*  HCT 28.1* 27.1*  PLT 621* 603*  APTT -- --  INR 1.15 --     Basename 08/23/12 0615 08/22/12 0628 08/21/12 0640  NA 132* 135 136  K 4.4 4.2 4.4  CL 99 98 99  CO2 27 29 30   GLUCOSE 113* 106* 125*  BUN 16 17 14   CREATININE 0.50 0.51 0.54  LABCREA -- -- --  CREAT24HRUR -- -- --  CALCIUM 12.8* 13.2* 13.1*  MG 1.9 1.9 --  PHOS 2.4 2.7 3.4  PROT -- -- --  ALBUMIN -- -- --  AST -- -- --  ALT -- -- --  ALKPHOS -- -- --  BILITOT -- -- --  BILIDIR -- -- --  IBILI -- -- --  PREALBUMIN -- -- --  TRIG -- -- --  CHOLHDL -- -- --  CHOL -- -- --   Estimated Creatinine Clearance: 83.9 ml/min (by C-G formula based on Cr of 0.5).   No results found for this basename: GLUCAP:3 in the last 72 hours  Medications:  Scheduled:     . chlorhexidine  15 mL Mouth Rinse BID  . ertapenem  1 g Intravenous Q24H  . fentaNYL  25 mcg Transdermal Q72H  . [EXPIRED] fentaNYL      . lidocaine-EPINEPHrine  20 mL Other Once  . [COMPLETED] magnesium sulfate 1 - 4 g bolus IVPB  2 g Intravenous Once   . [EXPIRED] midazolam      . 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   GI: Noted patient has been NPO since 11/19, NGT in place with output in last 24 hours. Noted pt had small brb via NGT 11/27, then again yesterday AM. Noted albumin now down to 2.9, patient's labs do not depict refeeding syndrome. Appreciate RD assessment of nutritional needs - Prealbumin = 12.3. CT on 11/29 reported with intra-abdominal abscess with fluid likely to be anastomotic leak, ileus, free air. Now s/p percutaneous drainage of abdominal/pelvic fluid Endo: Serum CBGs controlled, no SSI needed, no prior hx DM/DM meds.   LytesJudieth Keens are WNL except corrected Ca high at 13.68, iCa also elevated at 1.87 - Ca x Phos= 32.8, goal is <50. MIVF with NS20K  at [facility], [place]  - All electrolytes were removed from TPN due to hypercalcemia. K+ 4.4, Mg 1.9, Phos 2.4 Renal: Scr WNL, UOP 0.7 Pulm: RA, stable  Cards: VSS  Hepatobil: LFTs nml except AlkPhos slightly elevated, trig are nml.  Neuro: nml per MD assessment.  ID: Day#9 Invanz, empirically for elevated WBC. Pt remains afebrile. No micro data.  Best Practices: MC, PPI TPN Access: PICC placed 11/27 TPN day#: 5 (11/27>> )  Plan:  - Continue clinimix 5/15 at goal rate of 22ml/hr (provides 96gm/day protein>>100% goal + average of 1569 kcal/day>>87% goal) - Will supplement MVI/trace elements and IV fats on MWF d/t Publishing copy.  - f/u AM labs - Repeat magnesium 2gm IV x 1 - f/u hypercalcemia work-up  [name], PharmD, BCPS Pager # [phone] 08/23/2012 9:05 AM

## 2012-08-23 NOTE — Progress Notes (Signed)
12 Days Post-Op  Subjective: Pt feeling okay today, pain well controlled, tolerating ice chips and NG tube.  Pt denies N/V.  No output from ostomy or gas yet.  Pt up ambulating to BR and through halls.    Objective: Vital signs in last 24 hours: Temp:  [98 F (36.7 C)-98.6 F (37 C)] 98.6 F (37 C) (12/01 0546) Pulse Rate:  [77-97] 87  (12/01 0546) Resp:  [12-20] 18  (12/01 0546) BP: (103-115)/(68-83) 103/73 mmHg (12/01 0546) SpO2:  [100 %] 100 % (12/01 0546) Last BM Date: 08/11/12  Intake/Output from previous day: 11/30 0701 - 12/01 0700 In: 3386.2 [I.V.:1328.8; IV Piggyback:50; JWJ:1914.7] Out: 2060 [Urine:1100; Emesis/NG output:900; Drains:60] Intake/Output this shift:    PE: Gen:  Alert, NAD, pleasant Abd: Soft, mild tenderness and distension, diminished BS, no HSM, incisions C/D/I, drain with minimal sanguinous drainage, ostomy viable but no output NG-dark green output   Lab Results:   Basename 08/22/12 0628 08/21/12 0640  WBC 11.6* 10.9*  HGB 9.3* 8.8*  HCT 28.1* 27.1*  PLT 621* 603*   BMET  Basename 08/23/12 0615 08/22/12 0628  NA 132* 135  K 4.4 4.2  CL 99 98  CO2 27 29  GLUCOSE 113* 106*  BUN 16 17  CREATININE 0.50 0.51  CALCIUM 12.8* 13.2*   PT/INR  Basename 08/22/12 0628  LABPROT 14.5  INR 1.15   CMP     Component Value Date/Time   NA 132* 08/23/2012 0615   K 4.4 08/23/2012 0615   CL 99 08/23/2012 0615   CO2 27 08/23/2012 0615   GLUCOSE 113* 08/23/2012 0615   BUN 16 08/23/2012 0615   CREATININE 0.50 08/23/2012 0615   CALCIUM 12.8* 08/23/2012 0615   CALCIUM 10.7* 02/01/2012 0705   PROT 8.1 08/20/2012 0443   ALBUMIN 2.9* 08/20/2012 0443   AST 32 08/20/2012 0443   ALT 23 08/20/2012 0443   ALKPHOS 149* 08/20/2012 0443   BILITOT 0.4 08/20/2012 0443   GFRNONAA >90 08/23/2012 0615   GFRAA >90 08/23/2012 0615   Lipase     Component Value Date/Time   LIPASE 16 03/21/2012 0647       Studies/Results: Ct Guided Abscess Drain  08/22/2012   *RADIOLOGY REPORT*  CT GUIDED ABSCESS DRAIN  Date: 08/22/2012  Clinical History: 36 year old female with a very complicated surgical history.  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  She has experienced little output from her colostomy and copious output, and G tube.  A recent CT scan demonstrated a complex fluid collection in the left pericolic gutter, and around the surgical anastomosis with a secondary reactive ileus.  CT guided abscess drain placement is requested.  Procedures Performed: 1. CT guided drain placement  Interventional Radiologist:  Sterling Big, MD  Sedation: Moderate (conscious) sedation was used.  To mg Versed, 200 mcg Fentanyl were administered intravenously.  The patient's vital signs were monitored continuously by radiology nursing throughout the procedure.  Sedation Time: 40 minutes  Fluoroscopy time: 8 seconds  Contrast volume: None  PROCEDURE/FINDINGS:   Informed consent was obtained from the patient following explanation of the procedure, risks, benefits and alternatives. The patient understands, agrees and consents for the procedure. All questions were addressed. A time out was performed.  Maximal barrier sterile technique utilized including caps, mask, sterile gowns, sterile gloves, large sterile drape, hand hygiene, and betadine skin prep.  A planning axial CT scan was performed.  The left pericolic gutter fluid collection was successfully identified an appropriate skin entry site marked.  Local anesthesia was achieved by infiltration of 1% lidocaine.  Lidocaine infiltration was carried deeper through the abdominal muscles.  Under CT fluoroscopic guidance, and 18 gauge trocar needle was carefully advanced into the fluid collection.  A 0.035-inch wire was then advanced into the fluid collection.  The wire progressed inferiorly and medially as expected.  The tract was then serially dilated to  14-French.  A 14 French Cook all-purpose drainage catheter modified with additional side holes was then advanced over the wire.  The pigtail was formed and approximately 60 ml of thick, dark red gelatinous blood was successfully aspirated.  The catheter was then gently flushed with saline and connected to suction bulb drainage.  Post drain placement axial CT imaging confirmed location of the drain within the left hemi abdominal fluid collection.  The fluid collection was decreased in size.  The catheter was then secured to the skin with 0-prolene suture and an adhesive fixator.  The patient tolerated the procedure well, there is no immediate complication.  This sample of aspirated hematoma will be sent to micro for culture.  IMPRESSION:  1.  Technically successful placement of a 14-French all-purpose drainage catheter modified with additional side holes into the left abdominal complex fluid collection.  Approximately 60 ml of old hematoma was successfully aspirated.  This will be sent for culture.  2.  Recommend maintaining drainage catheter to bulb suction with flushes at least Q- shift to facilitate drainage of this presumed post-operative hematoma.  Signed,  Sterling Big, MD Vascular & Interventional Radiologist [facility]   Original Report Authenticated By: Malachy Moan, M.D.    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  She continues to have prolonged ileus with no output from her colostomy and relatively high output from her NGT.  CT ABDOMEN AND PELVIS WITH CONTRAST  Technique:  Multidetector CT imaging of the abdomen and pelvis was performed following the standard protocol during bolus administration of intravenous contrast.  Contrast: 80mL OMNIPAQUE IOHEXOL 300 MG/ML  SOLN   Comparison: Most recent CT scan abdomen/pelvis 07/23/2012  Findings:  Lower Chest:  Mild elevation of the left hemidiaphragm with resultant subsegmental atelectasis in the left lower lobe. Additionally there is dependent linear atelectasis in the right lower lobe. Visualized cardiac structures within normal limits for size.  No pericardial effusion.  Unremarkable visualized thoracic esophagus carrying a nasogastric tube into the stomach.  Abdomen: Interval development of a loculated peripherally enhancing complex fluid collection in the region of the prior left pericolic gutter.  The fluid collection begins at the level of the inferior pole of the left kidney and extends inferiorly along the anterior pararenal fascia before extending across the midline into the mesentery.  Here, the fluid surrounds the enteral enteric anastomosis and extends anteriorly to the anterior abdominal wall. The greatest dimensions of the left pericolic paracolic gutter collection measure 5.4 x 2.8 x 9.8 cm. The portion of the collection extending from the left pericolic gutter medially into the small bowel mesentery measures approximately 6.9 x 2.1 cm and is relatively high in attenuation with Hounsfield units in the mid to upper 50s.  A lower attenuation fluid collection adjacent to the ileal surgical staple line appears contiguous with the other fluid  collections and extends anteriorly toward the anterior abdominal wall.  This portion of the complex collection measures approximately 5.3 x 3.1 cm and is of intermediate attenuation.  Additionally, there is a focal left subdiaphragmatic fluid collection containing several locules of air.  This collection measures 8.3 x 2.8 by up to 2.2 cm and is positioned between the gastric fundus and the diaphragm.  Free intraperitoneal air is noted in the mid epigastric region anterior to the left hepatic lobe.  There are a few other small scattered locules of air.  An isolated locule of gas deep in the  anatomic pelvis may also be extra luminal.  The stomach is distended with oral contrast material as are multiple loops of proximal jejunum.  There is focal thickening of the first portion of the jejunum where it is in close approximation with the left pericolic fluid collection.  The contrast material becomes diluted as progresses more distally within the bowel. There is no focal transition point.  Otherwise, unremarkable CT appearance of the spleen, liver, kidneys and adrenal glands.  The patient is status post cholecystectomy. New left lower quadrant end colostomy.  Air is noted within the bladder lumen.  Bones: No acute fracture or aggressive appearing lytic or blastic osseous lesion.  Vascular: No focal abnormality.  IMPRESSION:  1.  CT findings are consistent with development of intra-abdominal abscess which appears to be originating around the ileal enteral enteric anastomosis in the deep anatomic pelvis.  The fluid is complex with areas of high attenuation which may represent extravasating oral contrast material (signifying anastomotic leak), or blood products.  The fluid collection extends from the region of the anastomosis anteriorly toward the anterior abdominal wall and also extends across the abdomen into the left pericolic gutter where it ascends to the level of the left lower pole of the kidney. This collection would be amenable to CT guided percutaneous drainage.  2.  Presumed reactive ileus with dilatation of the stomach and proximal small bowel.  There are areas of focal bowel wall thickening where the bowel comes in close contact with the above described fluid collection.  3.  There is a separate loculated fluid and gas collection in the left subdiaphragmatic space interposition between the diaphragm and the dilated fundus of the stomach.  Currently, this fluid collection is not amenable to CT guided drainage without transgressing either gastric wall or pleura.  4.  Free intraperitoneal air  anterior to the left hepatic lobe and locules of gas within the left subdiaphragmatic fluid collection may be residual from the patient's prior operation although this is slightly more than would be expected given the interval.  The gas does not appear to connect with the peri anastomotic fluid collection.  There is a single locule of free air deep within the anatomic pelvis on image 69 which may be extraluminal.  5.  Interval surgical changes including take down of the prior diverting ileostomy with creation of an ileal-ileal enteric anastomosis in the midline anatomic pelvis and a left end colostomy.  The patient is status post prior left hemicolectomy.  6.  Mild elevation of the left hemidiaphragm and left lower lobe atelectasis may be reactive and secondary to the adjacent left subdiaphragmatic fluid collection.  7.  Air within the bladder lumen.  Recommend correlation with history of recent instrumentation.  Infection with a gas-forming organism could have a similar appearance.  These results were called by telephone on 08/21/2012 at 02:35 p.m. to Barnetta Chapel, PA, who verbally acknowledged these  results.   Original Report Authenticated By: Malachy Moan, M.D.     Anti-infectives: Anti-infectives     Start     Dose/Rate Route Frequency Ordered Stop   08/13/12 1800   ertapenem (INVANZ) 1 g in sodium chloride 0.9 % 50 mL IVPB        1 g 100 mL/hr over 30 Minutes Intravenous Every 24 hours 08/13/12 1723     08/11/12 1800   ciprofloxacin (CIPRO) IVPB 400 mg        400 mg 200 mL/hr over 60 Minutes Intravenous Every 12 hours 08/11/12 1746 08/11/12 2116   08/11/12 1800   metroNIDAZOLE (FLAGYL) IVPB 500 mg        500 mg 100 mL/hr over 60 Minutes Intravenous Every 8 hours 08/11/12 1746 08/12/12 0217   08/11/12 0600   metroNIDAZOLE (FLAGYL) IVPB 500 mg        500 mg 100 mL/hr over 60 Minutes Intravenous On call to O.R. 08/10/12 1428 08/11/12 1020   08/11/12 0600   ciprofloxacin (CIPRO) IVPB 400  mg        400 mg 200 mL/hr over 60 Minutes Intravenous On call to O.R. 08/10/12 1428 08/11/12 1020           Assessment/Plan 36 y/o s/p ex lap, lysis of adhesions, and small bowel resection and colostomy 1.  Cont invanz until fluid micro comes back 2.  No bowel function yet-Cont NPO, ng, TPN 3.  HyperCa-improved 0.4 today from yesterday now at 12.8, will continue to monitor and if not improved will get medicine consult, asymptomatic 4.  Hold lovenox today, may restart tomorrow if NG output is improved 5.  Ambulation OOB and IS   LOS: 12 days    DORT, Nelani Schmelzle 08/23/2012, 9:12 AM Pager: [phone]

## 2012-08-24 ENCOUNTER — Inpatient Hospital Stay (HOSPITAL_COMMUNITY): Payer: Medicaid Other

## 2012-08-24 LAB — COMPREHENSIVE METABOLIC PANEL WITH GFR
ALT: 49 U/L — ABNORMAL HIGH (ref 0–35)
AST: 57 U/L — ABNORMAL HIGH (ref 0–37)
Albumin: 2.7 g/dL — ABNORMAL LOW (ref 3.5–5.2)
Alkaline Phosphatase: 234 U/L — ABNORMAL HIGH (ref 39–117)
BUN: 16 mg/dL (ref 6–23)
CO2: 25 meq/L (ref 19–32)
Calcium: 12.9 mg/dL — ABNORMAL HIGH (ref 8.4–10.5)
Chloride: 100 meq/L (ref 96–112)
Creatinine, Ser: 0.57 mg/dL (ref 0.50–1.10)
GFR calc Af Amer: >90 mL/min (ref >90)
GFR calc non Af Amer: >90 mL/min (ref >90)
Glucose, Bld: 114 mg/dL — ABNORMAL HIGH (ref 70–99)
Potassium: 3.9 meq/L (ref 3.5–5.1)
Sodium: 133 meq/L — ABNORMAL LOW (ref 135–145)
Total Bilirubin: 0.3 mg/dL (ref 0.3–1.2)
Total Protein: 7.8 g/dL (ref 6.0–8.3)

## 2012-08-24 LAB — DIFFERENTIAL
Basophils Absolute: 0.1 K/uL (ref 0.0–0.1)
Basophils Relative: 1 % (ref 0–1)
Eosinophils Absolute: 0.3 K/uL (ref 0.0–0.7)
Eosinophils Relative: 3 % (ref 0–5)
Lymphocytes Relative: 13 % (ref 12–46)
Lymphs Abs: 1.3 K/uL (ref 0.7–4.0)
Monocytes Absolute: 1.2 K/uL — ABNORMAL HIGH (ref 0.1–1.0)
Monocytes Relative: 12 % (ref 3–12)
Neutro Abs: 7.2 K/uL (ref 1.7–7.7)
Neutrophils Relative %: 72 % (ref 43–77)

## 2012-08-24 LAB — PREALBUMIN: Prealbumin: 22.7 mg/dL (ref 17.0–34.0)

## 2012-08-24 LAB — CHOLESTEROL, TOTAL: Cholesterol: 108 mg/dL (ref 0–200)

## 2012-08-24 LAB — PHOSPHORUS: Phosphorus: 2.1 mg/dL — ABNORMAL LOW (ref 2.3–4.6)

## 2012-08-24 LAB — TRIGLYCERIDES: Triglycerides: 92 mg/dL (ref <150)

## 2012-08-24 LAB — MAGNESIUM: Magnesium: 1.9 mg/dL (ref 1.5–2.5)

## 2012-08-24 LAB — CBC
HCT: 25.7 % — ABNORMAL LOW (ref 36.0–46.0)
Hemoglobin: 8.5 g/dL — ABNORMAL LOW (ref 12.0–15.0)
MCH: 30.6 pg (ref 26.0–34.0)
MCHC: 33.1 g/dL (ref 30.0–36.0)
MCV: 92.4 fL (ref 78.0–100.0)
Platelets: 572 K/uL — ABNORMAL HIGH (ref 150–400)
RBC: 2.78 MIL/uL — ABNORMAL LOW (ref 3.87–5.11)
RDW: 14 % (ref 11.5–15.5)
WBC: 10 K/uL (ref 4.0–10.5)

## 2012-08-24 MED ORDER — FAT EMULSION 20 % IV EMUL
240.0000 mL | INTRAVENOUS | Status: AC
  Administered 2012-08-24: 240 mL via INTRAVENOUS
  Filled 2012-08-24: qty 250

## 2012-08-24 MED ORDER — MAGNESIUM SULFATE 40 MG/ML IJ SOLN
2.0000 g | Freq: Once | INTRAMUSCULAR | Status: AC
  Administered 2012-08-24: 2 g via INTRAVENOUS
  Filled 2012-08-24: qty 50

## 2012-08-24 MED ORDER — TRACE MINERALS CR-CU-F-FE-I-MN-MO-SE-ZN IV SOLN
INTRAVENOUS | Status: AC
  Administered 2012-08-24: 17:00:00 via INTRAVENOUS
  Filled 2012-08-24: qty 2000

## 2012-08-24 MED ORDER — POTASSIUM PHOSPHATE DIBASIC 3 MMOLE/ML IV SOLN
20.0000 mmol | Freq: Once | INTRAVENOUS | Status: AC
  Administered 2012-08-24: 20 mmol via INTRAVENOUS
  Filled 2012-08-24: qty 6.67

## 2012-08-24 MED ORDER — IOHEXOL 300 MG/ML  SOLN
450.0000 mL | Freq: Once | INTRAMUSCULAR | Status: AC | PRN
  Administered 2012-08-24: 450 mL

## 2012-08-24 NOTE — Progress Notes (Signed)
13 Days Post-Op  Subjective: Pt feeling about the same as yesterday; occ pelvic cramping; no nausea/vomiting  Objective: Vital signs in last 24 hours: Temp:  [98.1 F (36.7 C)-98.6 F (37 C)] 98.6 F (37 C) (12/02 0559) Pulse Rate:  [66-77] 77  (12/02 0559) Resp:  [16-19] 19  (12/02 0559) BP: (106-108)/(64-69) 108/69 mmHg (12/02 0559) SpO2:  [100 %] 100 % (12/02 0559) Last BM Date: 08/11/12  Intake/Output from previous day: 12/01 0701 - 12/02 0700 In: 3175 [I.V.:1200; TPN:1920] Out: 1600 [Urine:1000; Emesis/NG output:500; Drains:100] Intake/Output this shift:    Left abd drain intact, output 100 cc's bloody fluid; drain flushed with 5 cc's sterile NS without difficulty; cx's neg to date; no output from ostomy  Lab Results:   [facility] 08/24/12 0540 08/22/12 0628  WBC 10.0 11.6*  HGB 8.5* 9.3*  HCT 25.7* 28.1*  PLT 572* 621*   BMET  Basename 08/24/12 0540 08/23/12 0615  NA 133* 132*  K 3.9 4.4  CL 100 99  CO2 25 27  GLUCOSE 114* 113*  BUN 16 16  CREATININE 0.57 0.50  CALCIUM 12.9* 12.8*   PT/INR  Basename 08/22/12 0628  LABPROT 14.5  INR 1.15   Results for orders placed during the hospital encounter of 08/11/12  CULTURE, ROUTINE-ABSCESS     Status: Normal (Preliminary result)   Collection Time   08/22/12 10:39 AM      Component Value Range Status Comment   Specimen Description ABSCESS   Final    Special Requests POST OPERATIVE HEMATOMA   Final    Gram Stain     Final    Value: MODERATE WBC PRESENT, PREDOMINANTLY PMN     NO SQUAMOUS EPITHELIAL CELLS SEEN     NO ORGANISMS SEEN   Culture NO GROWTH 2 DAYS   Final    Report Status PENDING   Incomplete     ABG No results found for this basename: PHART:2,PCO2:2,PO2:2,HCO3:2 in the last 72 hours  Studies/Results: Ct Guided Abscess Drain  08/22/2012  *RADIOLOGY REPORT*  CT GUIDED ABSCESS DRAIN  Date: 08/22/2012  Clinical History: 36 year old female with a very complicated surgical history.  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  She has experienced little output from her colostomy and copious output, and G tube.  A recent CT scan demonstrated a complex fluid collection in the left pericolic gutter, and around the surgical anastomosis with a secondary reactive ileus.  CT guided abscess drain placement is requested.  Procedures Performed: 1. CT guided drain placement  Interventional Radiologist:  Sterling Big, MD  Sedation: Moderate (conscious) sedation was used.  To mg Versed, 200 mcg Fentanyl were administered intravenously.  The patient's vital signs were monitored continuously by radiology nursing throughout the procedure.  Sedation Time: 40 minutes  Fluoroscopy time: 8 seconds  Contrast volume: None  PROCEDURE/FINDINGS:   Informed consent was obtained from the patient following explanation of the procedure, risks, benefits and alternatives. The patient understands, agrees and consents for the procedure. All questions were addressed. A time out was performed.  Maximal barrier sterile technique utilized including caps, mask, sterile gowns, sterile gloves, large sterile drape, hand hygiene, and betadine skin prep.  A planning axial CT scan was performed.  The left pericolic gutter fluid collection was successfully identified an appropriate skin entry site marked.  Local anesthesia was achieved by infiltration of 1% lidocaine.  Lidocaine infiltration was carried deeper through the  abdominal muscles.  Under CT fluoroscopic guidance, and 18 gauge trocar needle was carefully advanced into the fluid collection.  A 0.035-inch wire was then advanced into the fluid collection.  The wire progressed inferiorly and medially as expected.  The tract was then serially dilated to 14-French.  A 14 French Cook all-purpose drainage catheter modified with additional side holes was then advanced over the wire.  The pigtail was formed and  approximately 60 ml of thick, dark red gelatinous blood was successfully aspirated.  The catheter was then gently flushed with saline and connected to suction bulb drainage.  Post drain placement axial CT imaging confirmed location of the drain within the left hemi abdominal fluid collection.  The fluid collection was decreased in size.  The catheter was then secured to the skin with 0-prolene suture and an adhesive fixator.  The patient tolerated the procedure well, there is no immediate complication.  This sample of aspirated hematoma will be sent to micro for culture.  IMPRESSION:  1.  Technically successful placement of a 14-French all-purpose drainage catheter modified with additional side holes into the left abdominal complex fluid collection.  Approximately 60 ml of old hematoma was successfully aspirated.  This will be sent for culture.  2.  Recommend maintaining drainage catheter to bulb suction with flushes at least Q- shift to facilitate drainage of this presumed post-operative hematoma.  Signed,  Sterling Big, MD Vascular & Interventional Radiologist [facility]   Original Report Authenticated By: Malachy Moan, M.D.     Anti-infectives: Anti-infectives     Start     Dose/Rate Route Frequency Ordered Stop   08/13/12 1800   ertapenem (INVANZ) 1 g in sodium chloride 0.9 % 50 mL IVPB        1 g 100 mL/hr over 30 Minutes Intravenous Every 24 hours 08/13/12 1723     08/11/12 1800   ciprofloxacin (CIPRO) IVPB 400 mg        400 mg 200 mL/hr over 60 Minutes Intravenous Every 12 hours 08/11/12 1746 08/11/12 2116   08/11/12 1800   metroNIDAZOLE (FLAGYL) IVPB 500 mg        500 mg 100 mL/hr over 60 Minutes Intravenous Every 8 hours 08/11/12 1746 08/12/12 0217   08/11/12 0600   metroNIDAZOLE (FLAGYL) IVPB 500 mg        500 mg 100 mL/hr over 60 Minutes Intravenous On call to O.R. 08/10/12 1428 08/11/12 1020   08/11/12 0600   ciprofloxacin (CIPRO) IVPB 400 mg        400  mg 200 mL/hr over 60 Minutes Intravenous On call to O.R. 08/10/12 1428 08/11/12 1020          Assessment/Plan: s/p left abd fluid coll/hematoma drainage 11/30; check final cx's; pt for contrast study of ostomy today per [facility]. Monitor labs. Check f/u CT once drain output declines.   LOS: 13 days    ALLRED,D [facility] 08/24/2012

## 2012-08-24 NOTE — Progress Notes (Signed)
Patient ID: Vanessa [name], female   DOB: April 24, 1976, 36 y.o.   MRN: [id number] 13 Days Post-Op  Subjective: Pt feels ok today.  No pain.    Objective: Vital signs in last 24 hours: Temp:  [98.1 F (36.7 C)-98.6 F (37 C)] 98.6 F (37 C) (12/02 0559) Pulse Rate:  [66-77] 77  (12/02 0559) Resp:  [16-19] 19  (12/02 0559) BP: (106-108)/(64-69) 108/69 mmHg (12/02 0559) SpO2:  [100 %] 100 % (12/02 0559) Last BM Date: 08/11/12  Intake/Output from previous day: 12/01 0701 - 12/02 0700 In: 3175 [I.V.:1200; TPN:1920] Out: 1600 [Urine:1000; Emesis/NG output:500; Drains:100] Intake/Output this shift:    PE: Abd: soft, wound is clean with 100% granulation tissue.  Ostomy with no output at all.  JP with old bloody drainage.  Lab Results:   Basename 08/24/12 0540 08/22/12 0628  WBC 10.0 11.6*  HGB 8.5* 9.3*  HCT 25.7* 28.1*  PLT 572* 621*   BMET  Basename 08/24/12 0540 08/23/12 0615  NA 133* 132*  K 3.9 4.4  CL 100 99  CO2 25 27  GLUCOSE 114* 113*  BUN 16 16  CREATININE 0.57 0.50  CALCIUM 12.9* 12.8*   PT/INR  Basename 08/22/12 0628  LABPROT 14.5  INR 1.15   CMP     Component Value Date/Time   NA 133* 08/24/2012 0540   K 3.9 08/24/2012 0540   CL 100 08/24/2012 0540   CO2 25 08/24/2012 0540   GLUCOSE 114* 08/24/2012 0540   BUN 16 08/24/2012 0540   CREATININE 0.57 08/24/2012 0540   CALCIUM 12.9* 08/24/2012 0540   CALCIUM 10.7* 02/01/2012 0705   PROT 7.8 08/24/2012 0540   ALBUMIN 2.7* 08/24/2012 0540   AST 57* 08/24/2012 0540   ALT 49* 08/24/2012 0540   ALKPHOS 234* 08/24/2012 0540   BILITOT 0.3 08/24/2012 0540   GFRNONAA >90 08/24/2012 0540   GFRAA >90 08/24/2012 0540   Lipase     Component Value Date/Time   LIPASE 16 03/21/2012 0647       Studies/Results: Ct Guided Abscess Drain  08/22/2012  *RADIOLOGY REPORT*  CT GUIDED ABSCESS DRAIN  Date: 08/22/2012  Clinical History: 36 year old female with a very complicated surgical history.  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  She has experienced little output from her colostomy and copious output, and G tube.  A recent CT scan demonstrated a complex fluid collection in the left pericolic gutter, and around the surgical anastomosis with a secondary reactive ileus.  CT guided abscess drain placement is requested.  Procedures Performed: 1. CT guided drain placement  Interventional Radiologist:  Sterling Big, MD  Sedation: Moderate (conscious) sedation was used.  To mg Versed, 200 mcg Fentanyl were administered intravenously.  The patient's vital signs were monitored continuously by radiology nursing throughout the procedure.  Sedation Time: 40 minutes  Fluoroscopy time: 8 seconds  Contrast volume: None  PROCEDURE/FINDINGS:   Informed consent was obtained from the patient following explanation of the procedure, risks, benefits and alternatives. The patient understands, agrees and consents for the procedure. All questions were addressed. A time out was performed.  Maximal barrier sterile technique utilized including caps, mask, sterile gowns, sterile gloves, large sterile drape, hand hygiene, and betadine skin prep.  A planning axial CT scan was performed.  The left pericolic gutter fluid collection was successfully identified an appropriate skin entry site marked.  Local anesthesia was achieved by infiltration  of 1% lidocaine.  Lidocaine infiltration was carried deeper through the abdominal muscles.  Under CT fluoroscopic guidance, and 18 gauge trocar needle was carefully advanced into the fluid collection.  A 0.035-inch wire was then advanced into the fluid collection.  The wire progressed inferiorly and medially as expected.  The tract was then serially dilated to 14-French.  A 14 French Cook all-purpose drainage catheter modified with additional side holes was then advanced over the wire.  The pigtail was formed and approximately 60 ml of  thick, dark red gelatinous blood was successfully aspirated.  The catheter was then gently flushed with saline and connected to suction bulb drainage.  Post drain placement axial CT imaging confirmed location of the drain within the left hemi abdominal fluid collection.  The fluid collection was decreased in size.  The catheter was then secured to the skin with 0-prolene suture and an adhesive fixator.  The patient tolerated the procedure well, there is no immediate complication.  This sample of aspirated hematoma will be sent to micro for culture.  IMPRESSION:  1.  Technically successful placement of a 14-French all-purpose drainage catheter modified with additional side holes into the left abdominal complex fluid collection.  Approximately 60 ml of old hematoma was successfully aspirated.  This will be sent for culture.  2.  Recommend maintaining drainage catheter to bulb suction with flushes at least Q- shift to facilitate drainage of this presumed post-operative hematoma.  Signed,  Sterling Big, MD Vascular & Interventional Radiologist [facility] Radiology   Original Report Authenticated By: Malachy Moan, M.D.     Anti-infectives: Anti-infectives     Start     Dose/Rate Route Frequency Ordered Stop   08/13/12 1800   ertapenem (INVANZ) 1 g in sodium chloride 0.9 % 50 mL IVPB        1 g 100 mL/hr over 30 Minutes Intravenous Every 24 hours 08/13/12 1723     08/11/12 1800   ciprofloxacin (CIPRO) IVPB 400 mg        400 mg 200 mL/hr over 60 Minutes Intravenous Every 12 hours 08/11/12 1746 08/11/12 2116   08/11/12 1800   metroNIDAZOLE (FLAGYL) IVPB 500 mg        500 mg 100 mL/hr over 60 Minutes Intravenous Every 8 hours 08/11/12 1746 08/12/12 0217   08/11/12 0600   metroNIDAZOLE (FLAGYL) IVPB 500 mg        500 mg 100 mL/hr over 60 Minutes Intravenous On call to O.R. 08/10/12 1428 08/11/12 1020   08/11/12 0600   ciprofloxacin (CIPRO) IVPB 400 mg        400 mg 200 mL/hr over 60 Minutes  Intravenous On call to O.R. 08/10/12 1428 08/11/12 1020           Assessment/Plan  1. S/p ileostomy takedown and creation of end colostomy 2. Prolonged post op ileus 3. Intra-abd fluid collection, old hematoma  Plan: 1. Will get a gastrograffin study today to evaluate stoma.  ? Stricture at stoma site. 2. Cont NGT, output down yesterday.  LOS: 13 days    Holle Sprick E 08/24/2012, 11:05 AM Pager: [phone]

## 2012-08-24 NOTE — Progress Notes (Signed)
PARENTERAL NUTRITION CONSULT NOTE - FOLLOW UP  Pharmacy Consult for TPN Indication: Prolonged ileus following exp lap, LOA, SBR and colostomy on 11/19  Allergies  Allergen Reactions  . Penicillins Other (See Comments)    "anything w/penicillin in it makes me bleed"  . Labetalol Hcl Itching and Other (See Comments)    Bumps to bilateral arms.  . Morphine And Related Hives    Patient Measurements: Height: 5\' 4"  (162.6 cm) Weight: 143 lb 11.8 oz (65.2 kg) IBW/kg (Calculated) : 54.7   Vital Signs: Temp: 98.6 F (37 C) (12/02 0559) Temp src: Oral (12/02 0559) BP: 108/69 mmHg (12/02 0559) Pulse Rate: 77  (12/02 0559) Intake/Output from previous day: 12/01 0701 - 12/02 0700 In: 3175 [I.V.:1200; TPN:1920] Out: 1600 [Urine:1000; Emesis/NG output:500; Drains:100] Intake/Output from this shift:    Labs:  [facility] 08/24/12 0540 08/22/12 0628  WBC 10.0 11.6*  HGB 8.5* 9.3*  HCT 25.7* 28.1*  PLT 572* 621*  APTT -- --  INR -- 1.15     Basename 08/24/12 0540 08/23/12 0615 08/22/12 0628  NA 133* 132* 135  K 3.9 4.4 4.2  CL 100 99 98  CO2 25 27 29   GLUCOSE 114* 113* 106*  BUN 16 16 17   CREATININE 0.57 0.50 0.51  LABCREA -- -- --  CREAT24HRUR -- -- --  CALCIUM 12.9* 12.8* 13.2*  MG 1.9 1.9 1.9  PHOS 2.1* 2.4 2.7  PROT 7.8 -- --  ALBUMIN 2.7* -- --  AST 57* -- --  ALT 49* -- --  ALKPHOS 234* -- --  BILITOT 0.3 -- --  BILIDIR -- -- --  IBILI -- -- --  PREALBUMIN -- -- --  TRIG 92 -- --  CHOLHDL -- -- --  CHOL 108 -- --   Estimated Creatinine Clearance: 83.9 ml/min (by C-G formula based on Cr of 0.57).   No results found for this basename: GLUCAP:3 in the last 72 hours  Medications:  Scheduled:     . chlorhexidine  15 mL Mouth Rinse BID  . ertapenem  1 g Intravenous Q24H  . fentaNYL  25 mcg Transdermal Q72H  . lidocaine-EPINEPHrine  20 mL Other Once  . [COMPLETED] magnesium sulfate 1 - 4 g bolus IVPB  2 g Intravenous Once  . 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   GI: Noted patient has been NPO since 11/19, NGT in place with output in last 24 hours. Noted pt had small brb via NGT 11/27, then again 11/29 AM. Noted albumin now down to 2.9. Appreciate RD assessment of nutritional needs - Prealbumin = 12.3. CT on 11/29 reported with intra-abdominal abscess with fluid likely to be anastomotic leak, ileus, free air. Now s/p percutaneous drainage of abdominal/pelvic fluid-11/30. Endo: Serum CBGs checks and SSI discontinued d/t good glucose control on goal TPN. No prior hx DM/DM meds.   Lytes: Na low-likely related to fluid overload/no supplementation via TPN. Phos low as well. Corrected Ca high at 13.9, iCa also elevated at 1.87 - Ca x Phos= 29, goal is <50. MIVF with NS20K at 50  - All electrolytes were removed from  TPN due to hypercalcemia. Total fluid intake is 3.1L (66ml/kg/day). Renal: Scr WNL, UOP 0.6 Pulm: RA, stable  Cards: VSS  Hepatobil: Noted LFTs trended up slightly- suspect this may be due to no enterohepatic cycling with NPO status and continuous glucose infusion with TPN. AlkPhos slightly elevated, trig are nml.  Neuro: nml per MD assessment.  ID: Day#10 Invanz, empirically for elevated WBC. Pt remains afebrile. No micro data. Noted plans to continue this d/t new abd fluid collection/abscess. Best Practices: MC, PPI TPN Access: PICC placed 11/27 TPN day#: 11/27>>   Plan:  - Continue Clinimix 5/15 at goal rate of 3ml/hr (provides 96gm/day protein>>100% goal + average of 1569 kcal/day>>87% goal) - Will supplement MVI/trace elements and IV fats on MWF d/t Research scientist (life sciences).  - f/u BMET, Mg and Phos in AM - Repeat magnesium 2gm IV x 1, will give 20 mmol of KPhos today. - f/u hypercalcemia work-up  Thanks, Khianna Blazina K. Allena Katz, PharmD, BCPS.  Clinical Pharmacist Pager [phone]. 08/24/2012 9:07 AM

## 2012-08-25 ENCOUNTER — Inpatient Hospital Stay (HOSPITAL_COMMUNITY): Payer: Medicaid Other

## 2012-08-25 LAB — BASIC METABOLIC PANEL WITH GFR
BUN: 14 mg/dL (ref 6–23)
CO2: 26 meq/L (ref 19–32)
Calcium: 12.5 mg/dL — ABNORMAL HIGH (ref 8.4–10.5)
Chloride: 101 meq/L (ref 96–112)
Creatinine, Ser: 0.49 mg/dL — ABNORMAL LOW (ref 0.50–1.10)
GFR calc Af Amer: >90 mL/min
GFR calc non Af Amer: >90 mL/min
Glucose, Bld: 104 mg/dL — ABNORMAL HIGH (ref 70–99)
Potassium: 3.7 meq/L (ref 3.5–5.1)
Sodium: 133 meq/L — ABNORMAL LOW (ref 135–145)

## 2012-08-25 LAB — CULTURE, ROUTINE-ABSCESS: Culture: NO GROWTH

## 2012-08-25 LAB — MAGNESIUM: Magnesium: 1.9 mg/dL (ref 1.5–2.5)

## 2012-08-25 MED ORDER — POTASSIUM PHOSPHATE DIBASIC 3 MMOLE/ML IV SOLN
30.0000 mmol | Freq: Once | INTRAVENOUS | Status: AC
  Administered 2012-08-25: 30 mmol via INTRAVENOUS
  Filled 2012-08-25: qty 10

## 2012-08-25 MED ORDER — MAGNESIUM SULFATE 40 MG/ML IJ SOLN
2.0000 g | Freq: Once | INTRAMUSCULAR | Status: AC
  Administered 2012-08-25: 2 g via INTRAVENOUS
  Filled 2012-08-25: qty 50

## 2012-08-25 MED ORDER — CLINIMIX/DEXTROSE (5/15) 5 % IV SOLN
INTRAVENOUS | Status: AC
  Administered 2012-08-25: 17:00:00 via INTRAVENOUS
  Filled 2012-08-25: qty 2000

## 2012-08-25 MED ORDER — IOHEXOL 300 MG/ML  SOLN
500.0000 mL | Freq: Once | INTRAMUSCULAR | Status: AC | PRN
  Administered 2012-08-25: 500 mL

## 2012-08-25 MED ORDER — METOCLOPRAMIDE HCL 5 MG/ML IJ SOLN
5.0000 mg | Freq: Four times a day (QID) | INTRAMUSCULAR | Status: AC
  Administered 2012-08-25 – 2012-08-26 (×6): 5 mg via INTRAVENOUS
  Administered 2012-08-27: 22:00:00 via INTRAVENOUS
  Administered 2012-08-27 – 2012-08-28 (×5): 5 mg via INTRAVENOUS
  Filled 2012-08-25 (×13): qty 1

## 2012-08-25 NOTE — Progress Notes (Signed)
Mod amount of blood noted in pt's urine. Will continue to monitor

## 2012-08-25 NOTE — Progress Notes (Signed)
Nutrition Follow-up  Intervention:    TPN per pharmacy RD to follow for nutrition care plan  Assessment:   Patient s/p procedures 11/19: EXPLORATORY LAPAROTOMY  EXTENSIVE ENTEROLYSIS OF ADHESIONS  SMALL BOWEL RESECTION  COLOSTOMY  RIGID SIGMOIDOSCOPY   S/p CT guided abscess drain per IR 11/30.  NGT in place to LIS.   Retrograde enema via stoma did not reveal obstruction, now plans for small bowel follow-through (SBFT) to assess anastomotic obstruction.  Patient is receiving TPN with Clinimix E 5/15 @ 80 ml/hr.  Lipids (20% IVFE @ 10 ml/hr), multivitamins, and trace elements are provided 3 times weekly (MWF) due to national backorder.  Provides 1569 kcal and 96 grams protein daily (based on weekly average).  Meets 87% minimum estimated kcal and 100% minimum estimated protein needs.  Diet Order:  NPO  Meds: Scheduled Meds:   . chlorhexidine  15 mL Mouth Rinse BID  . ertapenem  1 g Intravenous Q24H  . fentaNYL  25 mcg Transdermal Q72H  . lidocaine-EPINEPHrine  20 mL Other Once  . [COMPLETED] magnesium sulfate 1 - 4 g bolus IVPB  2 g Intravenous Once  . metoCLOPramide (REGLAN) injection  5 mg Intravenous Q6H  . pantoprazole (PROTONIX) IV  40 mg Intravenous Q24H  . [COMPLETED] potassium phosphate IVPB (mmol)  20 mmol Intravenous Once  . potassium phosphate IVPB (mmol)  30 mmol Intravenous Once   Continuous Infusions:   . 0.9 % NaCl with KCl 20 mEq / L 30 mL/hr at 08/24/12 0954  . TPN (CLINIMIX) +/- additives 80 mL/hr at 08/24/12 1705   And  . fat emulsion 240 mL (08/24/12 1705)  . [EXPIRED] TPN (CLINIMIX) +/- additives 80 mL/hr at 08/23/12 1723  . TPN (CLINIMIX) +/- additives     PRN Meds:.HYDROmorphone (DILAUDID) injection, [COMPLETED] iohexol, silver nitrate applicators, sodium chloride   CMP     Component Value Date/Time   NA 133* 08/25/2012 0500   K 3.7 08/25/2012 0500   CL 101 08/25/2012 0500   CO2 26 08/25/2012 0500   GLUCOSE 104* 08/25/2012 0500   BUN 14 08/25/2012  0500   CREATININE 0.49* 08/25/2012 0500   CALCIUM 12.5* 08/25/2012 0500   CALCIUM 10.7* 02/01/2012 0705   PROT 7.8 08/24/2012 0540   ALBUMIN 2.7* 08/24/2012 0540   AST 57* 08/24/2012 0540   ALT 49* 08/24/2012 0540   ALKPHOS 234* 08/24/2012 0540   BILITOT 0.3 08/24/2012 0540   GFRNONAA >90 08/25/2012 0500   GFRAA >90 08/25/2012 0500    Phosphorus  Date Value Range Status  08/25/2012 2.1* 2.3 - 4.6 mg/dL Final    Magnesium  Date Value Range Status  08/25/2012 1.9  1.5 - 2.5 mg/dL Final    Prealbumin  Date Value Range Status  08/24/2012 22.7  17.0 - 34.0 mg/dL Final     Intake/Output Summary (Last 24 hours) at 08/25/12 1210 Last data filed at 08/25/12 1126  Gross per 24 hour  Intake   2840 ml  Output   1315 ml  Net   1525 ml    Weight Status:  65.2 kg (11/19) -- no new weight available  Re-estimated needs:  1800-1950 kcals, 90-100 gm protein  Nutrition Dx:  Inadequate Oral Intake r/t altered GI function as evidenced by NPO status, ongoing  Goal:  TPN to meet >90% of estimated protein needs, maximize energy provision as able during national lipid backorder, met  Monitor:  TPN prescription, PO diet advancement, weight, labs, I/O's  Kirkland Hun, RD, LDN Pager #:  Flasher Pager #: [phone]

## 2012-08-25 NOTE — Progress Notes (Signed)
Small amount of bright red blood found in NG tube. Suction put on Regular briefly and blood was suctioned into canister. No more bright red blood in tubing at the moment, will continue to monitor.

## 2012-08-25 NOTE — Progress Notes (Signed)
PARENTERAL NUTRITION CONSULT NOTE - FOLLOW UP  Pharmacy Consult for TPN Indication: Prolonged ileus following exp lap, LOA, SBR and colostomy on 11/19  Allergies  Allergen Reactions  . Penicillins Other (See Comments)    "anything w/penicillin in it makes me bleed"  . Labetalol Hcl Itching and Other (See Comments)    Bumps to bilateral arms.  . Morphine And Related Hives    Patient Measurements: Height: 5\' 4"  (162.6 cm) Weight: 143 lb 11.8 oz (65.2 kg) IBW/kg (Calculated) : 54.7   Vital Signs: Temp: 98.1 F (36.7 C) (12/03 0703) Temp src: Oral (12/03 0703) BP: 102/65 mmHg (12/03 0703) Pulse Rate: 71  (12/03 0703) Intake/Output from previous day: 12/02 0701 - 12/03 0700 In: 3347.7 [I.V.:777; IV Piggyback:506.7; TPN:2017] Out: 1080 [Emesis/NG output:950; Drains:80; Stool:50] Intake/Output from this shift:    Labs:  [facility] 08/24/12 0540  WBC 10.0  HGB 8.5*  HCT 25.7*  PLT 572*  APTT --  INR --     Basename 08/25/12 0500 08/24/12 0540 08/23/12 0615  NA 133* 133* 132*  K 3.7 3.9 4.4  CL 101 100 99  CO2 26 25 27   GLUCOSE 104* 114* 113*  BUN 14 16 16   CREATININE 0.49* 0.57 0.50  LABCREA -- -- --  CREAT24HRUR -- -- --  CALCIUM 12.5* 12.9* 12.8*  MG 1.9 1.9 1.9  PHOS 2.1* 2.1* 2.4  PROT -- 7.8 --  ALBUMIN -- 2.7* --  AST -- 57* --  ALT -- 49* --  ALKPHOS -- 234* --  BILITOT -- 0.3 --  BILIDIR -- -- --  IBILI -- -- --  PREALBUMIN -- 22.7 --  TRIG -- 92 --  CHOLHDL -- -- --  CHOL -- 108 --   Estimated Creatinine Clearance: 83.9 ml/min (by C-G formula based on Cr of 0.49).   No results found for this basename: GLUCAP:3 in the last 72 hours  Medications:  Scheduled:     . chlorhexidine  15 mL Mouth Rinse BID  . ertapenem  1 g Intravenous Q24H  . fentaNYL  25 mcg Transdermal Q72H  . lidocaine-EPINEPHrine  20 mL Other Once  . [COMPLETED] magnesium sulfate 1 - 4 g bolus IVPB  2 g Intravenous Once  . pantoprazole (PROTONIX) IV  40 mg Intravenous  Q24H  . 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 Now s/p percutaneous drainage of abdominal/pelvic fluid-11/30.   GI: Noted patient has been NPO since 11/19, NGT in place with output in last 24 hours. Noted pt had small brb via NGT 11/27, then again 11/29 AM and 12/3. Noted retrograde enema via stoma did not reveal obstruction, now plans for SBFT to assess anastomotic obstruction. NGT output back up to . Endo: Serum CBGs checks and SSI discontinued d/t good glucose control on goal TPN. No prior hx DM/DM meds.   Lytes: Na low-likely related to fluid overload/no supplementation via TPN. Phos low as well. Corrected Ca high at 13.9, iCa also elevated at 1.87 - Ca x Phos=  29, goal is <50. MIVF with NS20K at 30 - All electrolytes were removed from TPN due to hypercalcemia.  Renal: Scr WNL, UOP not documented Pulm: RA, stable  Cards: VSS  Hepatobil: Noted LFTs trended up slightly- suspect this may be due to no enterohepatic cycling with NPO status and continuous glucose infusion with TPN. AlkPhos slightly elevated, trig are nml. Prealbumin increased, now within normal range of 18-40 (22.7). Neuro: nml per MD assessment.  ID: Day#11 invanz, empirically for elevated WBC. Pt remains afebrile. No micro data. Noted plans to continue this d/t new abd fluid collection/abscess. Best Practices: MC, PPI TPN Access: PICC placed 11/27 TPN day#: 11/27>>   Plan:  - Continue Clinimix 5/15 at goal  rate of 71ml/hr (provides 96gm/day protein>>100% goal + average of 1569 kcal/day>>87% goal) - Will supplement MVI/trace elements and IV fats on MWF d/t Publishing copy.  - f/u BMET, Mg and Phos in AM - Repeat magnesium 2gm IV x 1, will give 30 mmol of KPhos today. - f/u hypercalcemia work-up. SBFT results and further plans.  Thanks, Vanessa [name], PharmD, BCPS.  Clinical Pharmacist Pager [phone]. 08/25/2012 8:13 AM

## 2012-08-25 NOTE — Progress Notes (Signed)
GS Progress Note Subjective: The patient is asymptomatic.  Retrograde enema through the colostomy showed no obstruction to the cecum.  The contrast CT from before showed contrast down to the anastomosis.  Therefore, the obstructive process has to be at the level of the stapled anastomosis which was widely patent at the time of surgery.  Will need to get SBFT today to confirm, then decide if this will be a limited process or will require repeat surgery.  Objective: Vital signs in last 24 hours: Temp:  [98.1 F (36.7 C)-98.4 F (36.9 C)] 98.1 F (36.7 C) (12/03 0703) Pulse Rate:  [60-77] 71  (12/03 0703) Resp:  [16-18] 16  (12/03 0703) BP: (102-118)/(56-73) 102/65 mmHg (12/03 0703) SpO2:  [100 %] 100 % (12/03 0703) Last BM Date: 08/11/12  Intake/Output from previous day: 12/02 0701 - 12/03 0700 In: 3347.7 [I.V.:777; IV Piggyback:506.7; TPN:2017] Out: 1080 [Emesis/NG output:950; Drains:80; Stool:50] Intake/Output this shift:    Lungs: Clear to auscultation  Abd: Soft, wounds are okay.  Hypoactiv e bowel sounds.  Extremities: No DVT signs or symp[toms  Neuro: Intact, good attitude.  Lab Results: CBC   Basename 08/24/12 0540  WBC 10.0  HGB 8.5*  HCT 25.7*  PLT 572*   BMET  Basename 08/25/12 0500 08/24/12 0540  NA 133* 133*  K 3.7 3.9  CL 101 100  CO2 26 25  GLUCOSE 104* 114*  BUN 14 16  CREATININE 0.49* 0.57  CALCIUM 12.5* 12.9*   PT/INR No results found for this basename: LABPROT:2,INR:2 in the last 72 hours ABG No results found for this basename: PHART:2,PCO2:2,PO2:2,HCO3:2 in the last 72 hours  Studies/Results: Dg Colon W/cm Thru Colostomy  08/24/2012  *RADIOLOGY REPORT*  Clinical Data: Status post colostomy placement for radiation proctitis 08/11/2012.  No output from colostomy.  WATER SOLUBLE CONTRAST ENEMA  Technique:  Initial scout AP supine abdominal image was obtained. Water soluble contrast was introduced into the colon in a retrograde fashion and  refluxed from the rectum to the cecum.  Spot images of the colon followed by overhead radiographs were obtained.  Fluoroscopy time: 1.48 minutes.  Comparison:  CT abdomen and pelvis 08/21/2012.  Findings:  Water-soluble contrast was infused under gravity into the patient's left lower quadrant ostomy.  Contrast opacifies the entire colon.  Stool is present within the colon.  There is no stricture.  No diverticular disease or polyps are identified although evaluation for polyps is limited due to the presence of stool.  IMPRESSION: Negative for stricture.  No finding to explain the lack of colostomy output.   Original Report Authenticated By: Holley Dexter, M.D.     Anti-infectives: Anti-infectives     Start     Dose/Rate Route Frequency Ordered Stop   08/13/12 1800   ertapenem (INVANZ) 1 g in sodium chloride 0.9 % 50 mL IVPB        1 g 100 mL/hr over 30 Minutes Intravenous Every 24 hours 08/13/12 1723     08/11/12 1800   ciprofloxacin (CIPRO) IVPB 400 mg        400 mg 200 mL/hr over 60 Minutes Intravenous Every 12 hours 08/11/12 1746 08/11/12 2116   08/11/12 1800   metroNIDAZOLE (FLAGYL) IVPB 500 mg        500 mg 100 mL/hr over 60 Minutes Intravenous Every 8 hours 08/11/12 1746 08/12/12 0217   08/11/12 0600   metroNIDAZOLE (FLAGYL) IVPB 500 mg        500 mg 100 mL/hr over 60 Minutes Intravenous  On call to O.R. 08/10/12 1428 08/11/12 1020   08/11/12 0600   ciprofloxacin (CIPRO) IVPB 400 mg        400 mg 200 mL/hr over 60 Minutes Intravenous On call to O.R. 08/10/12 1428 08/11/12 1020          Assessment/Plan: s/p Procedure(s): EXPLORATORY LAPAROTOMY LAPAROSCOPIC LYSIS OF ADHESIONS SMALL BOWEL RESECTION COLOSTOMY SBFT today.   LOS: 14 days    Marta Lamas. [name], MD, FACS [phone] [facility] 08/25/2012

## 2012-08-25 NOTE — Progress Notes (Signed)
Will get retrograde enema with watersoluble contrast through stoma and go from there.  If that does not show obstruction with get SBFT.  Marta Lamas. [name], MD, FACS [phone] [facility] Surgery

## 2012-08-26 LAB — CBC
MCH: 30.5 pg (ref 26.0–34.0)
MCV: 92.2 fL (ref 78.0–100.0)
Platelets: 614 10*3/uL — ABNORMAL HIGH (ref 150–400)
RDW: 14 % (ref 11.5–15.5)

## 2012-08-26 LAB — BASIC METABOLIC PANEL
BUN: 14 mg/dL (ref 6–23)
Calcium: 12.5 mg/dL — ABNORMAL HIGH (ref 8.4–10.5)
Creatinine, Ser: 0.47 mg/dL — ABNORMAL LOW (ref 0.50–1.10)
GFR calc Af Amer: >90 mL/min (ref >90)
GFR calc non Af Amer: >90 mL/min (ref >90)

## 2012-08-26 LAB — URINALYSIS, ROUTINE W REFLEX MICROSCOPIC
Bilirubin Urine: NEGATIVE
Ketones, ur: NEGATIVE mg/dL
Nitrite: NEGATIVE
Urobilinogen, UA: 0.2 mg/dL (ref 0.0–1.0)
pH: 5.5 (ref 5.0–8.0)

## 2012-08-26 LAB — URINE MICROSCOPIC-ADD ON

## 2012-08-26 MED ORDER — MAGNESIUM SULFATE 40 MG/ML IJ SOLN
2.0000 g | Freq: Once | INTRAMUSCULAR | Status: AC
  Administered 2012-08-26: 2 g via INTRAVENOUS
  Filled 2012-08-26: qty 50

## 2012-08-26 MED ORDER — POTASSIUM PHOSPHATE DIBASIC 3 MMOLE/ML IV SOLN
Freq: Once | INTRAVENOUS | Status: AC
  Administered 2012-08-26: 09:00:00 via INTRAVENOUS
  Filled 2012-08-26: qty 250

## 2012-08-26 MED ORDER — TRACE MINERALS CR-CU-F-FE-I-MN-MO-SE-ZN IV SOLN
INTRAVENOUS | Status: DC
  Administered 2012-08-26: 18:00:00 via INTRAVENOUS
  Filled 2012-08-26: qty 2000

## 2012-08-26 MED ORDER — FAT EMULSION 20 % IV EMUL
240.0000 mL | INTRAVENOUS | Status: AC
  Administered 2012-08-26: 240 mL via INTRAVENOUS
  Filled 2012-08-26: qty 250

## 2012-08-26 NOTE — Progress Notes (Signed)
15 Days Post-Op  Subjective: Pt doing ok; still has some mild pelvic discomfort and reports hematuria now  Objective: Vital signs in last 24 hours: Temp:  [98.1 F (36.7 C)-98.6 F (37 C)] 98.5 F (36.9 C) (12/04 0604) Pulse Rate:  [79-93] 82  (12/04 0604) Resp:  [18] 18  (12/04 0604) BP: (97-123)/(62-77) 97/62 mmHg (12/04 0604) SpO2:  [98 %-100 %] 98 % (12/04 0604) Last BM Date: 09-11-2012  Intake/Output from previous day: Sep 12, 2023 0701 - 12/04 0700 In: 2912 [I.V.:728; JXB:1478] Out: 1815 [Urine:200; Emesis/NG output:1150; Drains:40; Stool:425] Intake/Output this shift:    Left abd drain intact, insertion site ok, output 40 cc's reddish- brown fluid, cx's neg; drain flushed with sterile NS with return of reddish- brown fluid; fluid noted in ostomy bag now  Lab Results:   Basename 08/26/12 0520 08/24/12 0540  WBC 8.8 10.0  HGB 9.0* 8.5*  HCT 27.2* 25.7*  PLT 614* 572*   BMET  Basename 08/26/12 0520 Sep 11, 2012 0500  NA 128* 133*  K 3.6 3.7  CL 96 101  CO2 25 26  GLUCOSE 109* 104*  BUN 14 14  CREATININE 0.47* 0.49*  CALCIUM 12.5* 12.5*   PT/INR No results found for this basename: LABPROT:2,INR:2 in the last 72 hours ABG No results found for this basename: PHART:2,PCO2:2,PO2:2,HCO3:2 in the last 72 hours  Studies/Results: Dg Small Bowel  Sep 11, 2012  *RADIOLOGY REPORT*  Clinical Data:Suspected obstruction of ileo-ileal anastomoses.  SMALL BOWEL FOLLOW-THROUGH EXAMINATION  Technique: Serial images of the small bowel were obtained including spot views of the terminal ileum.  Fluoroscopy Time: 0.4 minutes  Comparison: 08/24/2012 water soluble enema  Findings: Initial scout image of the abdomen demonstrates contrast medium in the colon extending to the descending colostomy, along with what is later recognizable as dilute contrast in the distal small bowel loops.  The patient was administered Omnipaque-300 water soluble contrast medium via the nasogastric tube.  On the initial  20-minute image, contrast extended through most of the small bowel, but there was no significant differentially increased:  Contrast.  By 50 minutes, we demonstrate early additional filling of the colon, strongly favoring patency of the distal small bowel anastomosis.  By 1 hour and 40 minutes, there is clearly an increasing amount of contrast medium in the colon, indicating a patent anastomoses. Several mildly dilated loops of distal small bowel are present, measuring up to 3.8 cm.  I did obtain several spot images of the cecum to attempt to visualize the terminal ileum. However, given the recent surgery in this vicinity, I was did not apply pressure to the abdomen using the balloon paddle, and the patient remains very tender in this area.  There are loops of superimposed small bowel projecting over the cecum.  IMPRESSION:  1.  Following oral administration of barium, there is increase in overall volume of colonic contrast medium, essentially ruling out a high-grade small bowel obstruction.  There are some mildly dilated distal small bowel loops, best appreciated in the pelvis and left lower abdomen, favoring ileus over a low grade partial small-bowel obstruction. 2.  Please note that there was considerable residual contrast medium in the colon on the initial KUB; this in addition to in the patient's abdominal tenderness in the operative site prevents balloon compression of the ileocecal valve which makes it difficult to distinctly see the terminal ileum.   Original Report Authenticated By: Gaylyn Rong, M.D.    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  No output from colostomy.  WATER SOLUBLE CONTRAST ENEMA  Technique:  Initial scout AP supine abdominal image was obtained. Water soluble contrast was introduced into the colon in a retrograde fashion and refluxed from the rectum to the cecum.  Spot images  of the colon followed by overhead radiographs were obtained.  Fluoroscopy time: 1.48 minutes.  Comparison:  CT abdomen and pelvis 08/21/2012.  Findings:  Water-soluble contrast was infused under gravity into the patient's left lower quadrant ostomy.  Contrast opacifies the entire colon.  Stool is present within the colon.  There is no stricture.  No diverticular disease or polyps are identified although evaluation for polyps is limited due to the presence of stool.  IMPRESSION: Negative for stricture.  No finding to explain the lack of colostomy output.   Original Report Authenticated By: Holley Dexter, M.D.     Anti-infectives: Anti-infectives     Start     Dose/Rate Route Frequency Ordered Stop   08/13/12 1800   ertapenem (INVANZ) 1 g in sodium chloride 0.9 % 50 mL IVPB  Status:  Discontinued        1 g 100 mL/hr over 30 Minutes Intravenous Every 24 hours 08/13/12 1723 08/26/12 0939   08/11/12 1800   ciprofloxacin (CIPRO) IVPB 400 mg        400 mg 200 mL/hr over 60 Minutes Intravenous Every 12 hours 08/11/12 1746 08/11/12 2116   08/11/12 1800   metroNIDAZOLE (FLAGYL) IVPB 500 mg        500 mg 100 mL/hr over 60 Minutes Intravenous Every 8 hours 08/11/12 1746 08/12/12 0217   08/11/12 0600   metroNIDAZOLE (FLAGYL) IVPB 500 mg        500 mg 100 mL/hr over 60 Minutes Intravenous On call to O.R. 08/10/12 1428 08/11/12 1020   08/11/12 0600   ciprofloxacin (CIPRO) IVPB 400 mg        400 mg 200 mL/hr over 60 Minutes Intravenous On call to O.R. 08/10/12 1428 08/11/12 1020          Assessment/Plan: s/p left lower abdominal fluid coll/hematoma drainage 11/30; cont NS flushing of drain; check f/u CT later in week to assess adequacy of drainage,if output increases may need injection ; check UA   LOS: 15 days    Kyle Luppino,D [facility] 08/26/2012

## 2012-08-26 NOTE — Progress Notes (Signed)
PARENTERAL NUTRITION CONSULT NOTE - FOLLOW UP  Pharmacy Consult for TPN Indication: Prolonged ileus following exp lap, LOA, SBR and colostomy on 11/19  Allergies  Allergen Reactions  . Penicillins Other (See Comments)    "anything w/penicillin in it makes me bleed"  . Labetalol Hcl Itching and Other (See Comments)    Bumps to bilateral arms.  . Morphine And Related Hives    Patient Measurements: Height: 5\' 4"  (162.6 cm) Weight: 143 lb 11.8 oz (65.2 kg) IBW/kg (Calculated) : 54.7   Vital Signs: Temp: 98.5 F (36.9 C) (12/04 0604) Temp src: Oral (12/04 0604) BP: 97/62 mmHg (12/04 0604) Pulse Rate: 82  (12/04 0604) Intake/Output from previous day: 12/03 0701 - 12/04 0700 In: 2912 [I.V.:728; NWG:9562] Out: 1815 [Urine:200; Emesis/NG output:1150; Drains:40; Stool:425] Intake/Output from this shift:    Labs:  [facility] 08/26/12 0520 08/24/12 0540  WBC 8.8 10.0  HGB 9.0* 8.5*  HCT 27.2* 25.7*  PLT 614* 572*  APTT -- --  INR -- --     Basename 08/26/12 0520 08/25/12 0500 08/24/12 0540  NA 128* 133* 133*  K 3.6 3.7 3.9  CL 96 101 100  CO2 25 26 25   GLUCOSE 109* 104* 114*  BUN 14 14 16   CREATININE 0.47* 0.49* 0.57  LABCREA -- -- --  CREAT24HRUR -- -- --  CALCIUM 12.5* 12.5* 12.9*  MG 1.8 1.9 1.9  PHOS 2.6 2.1* 2.1*  PROT -- -- 7.8  ALBUMIN -- -- 2.7*  AST -- -- 57*  ALT -- -- 49*  ALKPHOS -- -- 234*  BILITOT -- -- 0.3  BILIDIR -- -- --  IBILI -- -- --  PREALBUMIN -- -- 22.7  TRIG -- -- 92  CHOLHDL -- -- --  CHOL -- -- 108   Estimated Creatinine Clearance: 83.9 ml/min (by C-G formula based on Cr of 0.47).   No results found for this basename: GLUCAP:3 in the last 72 hours  Medications:  Scheduled:     . chlorhexidine  15 mL Mouth Rinse BID  . ertapenem  1 g Intravenous Q24H  . fentaNYL  25 mcg Transdermal Q72H  . lidocaine-EPINEPHrine  20 mL Other Once  . [COMPLETED] magnesium sulfate 1 - 4 g bolus IVPB  2 g Intravenous Once  . metoCLOPramide  (REGLAN) injection  5 mg Intravenous Q6H  . pantoprazole (PROTONIX) IV  40 mg Intravenous Q24H  . [COMPLETED] potassium phosphate IVPB (mmol)  30 mmol Intravenous Once    Insulin Requirements in the past 24 hours:  None since TPN start (SSI d/c)  Current Nutrition:  NPO + Clinimix 5/15 at 70ml/hr  Nutritional Goals per RD assessment:  1800-1950 kCal, 80-95 grams of protein per day, 1.8-2 L/day  Assessment: Vanessa [name] has a hx of radiation proctitis/diverting ileostomy, now s/p ex lap with LOA, SBR, ileostomy takedown and R colostomy (11/19).CT on 11/29 reported with intra-abdominal abscess with fluid likely to be anastomotic leak, ileus, free air. Now s/p percutaneous drainage of abdominal/pelvic fluid-11/30.   GI: Noted patient has been NPO since 11/19, NGT in place with increased, output in last 24 hours. Noted pt had small brb via NGT 11/27, then again 11/29 AM and 12/3. Now with blood in urine. Noted retrograde enema via stoma did not reveal obstruction and SBFT ruled out SBO, suspicious of ileus. Endo: Serum CBGs checks and SSI discontinued d/t good glucose control on goal TPN. No prior hx DM/DM meds. Glucose remains nml on BMET. Lytes: Na low-likely related to fluid  overload/no supplementation via TPN. Phos and Mg low nml, K trending down. Corrected Ca high at 13.9, iCa also elevated at 1.87 - Ca x Phos= 29, goal is <50. MIVF with NS20K at 30 - All electrolytes were removed from TPN due to hypercalcemia.  Renal: Scr WNL, UOP not accurately documented Pulm: RA, stable  Cards: VSS  Hepatobil: Noted LFTs trended up slightly- suspect this may be due to no enterohepatic cycling with NPO status and continuous glucose infusion with TPN. AlkPhos slightly elevated, trig are nml. Prealbumin increased, now within normal range of 18-40 (22.7). Neuro: nml per MD assessment.  ID: Day#12 invanz, empirically for elevated WBC, now nml. Pt remains afebrile. No micro data. Noted plans to  continue this d/t new abd fluid collection/abscess. Best Practices: MC, PPI TPN Access: PICC placed 11/27 TPN day#: 11/27>>   Plan:  - Continue Clinimix 5/15 at goal rate of 13ml/hr (provides 96gm/day protein>>100% goal + average of 1569 kcal/day>>87% goal) - Will supplement MVI/trace elements and IV fats on MWF d/t Publishing copy.  - Repeat magnesium 2gm IV x 1, will give 30 mmol of KPhos today. - f/u hypercalcemia work-up.  - f/u CMET, Mg and Phos in AM  Thanks, Vanessa [name], PharmD, BCPS.  Clinical Pharmacist Pager [phone]. 08/26/2012 7:39 AM

## 2012-08-26 NOTE — Progress Notes (Signed)
0020 Patient void dark red blood with mod size clot. Dr. Derrell Lolling notified of bloody urine. Dr. Derrell Lolling order cbc for am. Will continue to monitor

## 2012-08-26 NOTE — Progress Notes (Signed)
GS Progress Note Subjective: Patient's stoma has been working since contrast study yesterday.  Had some bloody urine, but that may be bloody vaginal discharge.  Will get clean catch UA.  Objective: Vital signs in last 24 hours: Temp:  [98.1 F (36.7 C)-98.6 F (37 C)] 98.5 F (36.9 C) (12/04 0604) Pulse Rate:  [79-93] 82  (12/04 0604) Resp:  [18] 18  (12/04 0604) BP: (97-123)/(62-77) 97/62 mmHg (12/04 0604) SpO2:  [98 %-100 %] 98 % (12/04 0604) Last BM Date: 09/23/2012  Intake/Output from previous day: Sep 24, 2023 0701 - 12/04 0700 In: 2912 [I.V.:728; AVW:0981] Out: 1815 [Urine:200; Emesis/NG output:1150; Drains:40; Stool:425] Intake/Output this shift:    Lungs: Clear  Abd: Benign.  Wound is good and granulating  Extremities: No changes  Neuro: Intact  Lab Results: CBC   Basename 08/26/12 0520 08/24/12 0540  WBC 8.8 10.0  HGB 9.0* 8.5*  HCT 27.2* 25.7*  PLT 614* 572*   BMET  Basename 08/26/12 0520 2012/09/23 0500  NA 128* 133*  K 3.6 3.7  CL 96 101  CO2 25 26  GLUCOSE 109* 104*  BUN 14 14  CREATININE 0.47* 0.49*  CALCIUM 12.5* 12.5*   PT/INR No results found for this basename: LABPROT:2,INR:2 in the last 72 hours ABG No results found for this basename: PHART:2,PCO2:2,PO2:2,HCO3:2 in the last 72 hours  Studies/Results: Dg Small Bowel  09/23/12  *RADIOLOGY REPORT*  Clinical Data:Suspected obstruction of ileo-ileal anastomoses.  SMALL BOWEL FOLLOW-THROUGH EXAMINATION  Technique: Serial images of the small bowel were obtained including spot views of the terminal ileum.  Fluoroscopy Time: 0.4 minutes  Comparison: 08/24/2012 water soluble enema  Findings: Initial scout image of the abdomen demonstrates contrast medium in the colon extending to the descending colostomy, along with what is later recognizable as dilute contrast in the distal small bowel loops.  The patient was administered Omnipaque-300 water soluble contrast medium via the nasogastric tube.  On the initial  20-minute image, contrast extended through most of the small bowel, but there was no significant differentially increased:  Contrast.  By 50 minutes, we demonstrate early additional filling of the colon, strongly favoring patency of the distal small bowel anastomosis.  By 1 hour and 40 minutes, there is clearly an increasing amount of contrast medium in the colon, indicating a patent anastomoses. Several mildly dilated loops of distal small bowel are present, measuring up to 3.8 cm.  I did obtain several spot images of the cecum to attempt to visualize the terminal ileum. However, given the recent surgery in this vicinity, I was did not apply pressure to the abdomen using the balloon paddle, and the patient remains very tender in this area.  There are loops of superimposed small bowel projecting over the cecum.  IMPRESSION:  1.  Following oral administration of barium, there is increase in overall volume of colonic contrast medium, essentially ruling out a high-grade small bowel obstruction.  There are some mildly dilated distal small bowel loops, best appreciated in the pelvis and left lower abdomen, favoring ileus over a low grade partial small-bowel obstruction. 2.  Please note that there was considerable residual contrast medium in the colon on the initial KUB; this in addition to in the patient's abdominal tenderness in the operative site prevents balloon compression of the ileocecal valve which makes it difficult to distinctly see the terminal ileum.   Original Report Authenticated By: Gaylyn Rong, M.D.    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  No output from colostomy.  WATER SOLUBLE CONTRAST ENEMA  Technique:  Initial scout AP supine abdominal image was obtained. Water soluble contrast was introduced into the colon in a retrograde fashion and refluxed from the rectum to the cecum.  Spot images  of the colon followed by overhead radiographs were obtained.  Fluoroscopy time: 1.48 minutes.  Comparison:  CT abdomen and pelvis 08/21/2012.  Findings:  Water-soluble contrast was infused under gravity into the patient's left lower quadrant ostomy.  Contrast opacifies the entire colon.  Stool is present within the colon.  There is no stricture.  No diverticular disease or polyps are identified although evaluation for polyps is limited due to the presence of stool.  IMPRESSION: Negative for stricture.  No finding to explain the lack of colostomy output.   Original Report Authenticated By: Holley Dexter, M.D.     Anti-infectives: Anti-infectives     Start     Dose/Rate Route Frequency Ordered Stop   08/13/12 1800   ertapenem (INVANZ) 1 g in sodium chloride 0.9 % 50 mL IVPB        1 g 100 mL/hr over 30 Minutes Intravenous Every 24 hours 08/13/12 1723     08/11/12 1800   ciprofloxacin (CIPRO) IVPB 400 mg        400 mg 200 mL/hr over 60 Minutes Intravenous Every 12 hours 08/11/12 1746 08/11/12 2116   08/11/12 1800   metroNIDAZOLE (FLAGYL) IVPB 500 mg        500 mg 100 mL/hr over 60 Minutes Intravenous Every 8 hours 08/11/12 1746 08/12/12 0217   08/11/12 0600   metroNIDAZOLE (FLAGYL) IVPB 500 mg        500 mg 100 mL/hr over 60 Minutes Intravenous On call to O.R. 08/10/12 1428 08/11/12 1020   08/11/12 0600   ciprofloxacin (CIPRO) IVPB 400 mg        400 mg 200 mL/hr over 60 Minutes Intravenous On call to O.R. 08/10/12 1428 08/11/12 1020          Assessment/Plan: s/p Procedure(s): EXPLORATORY LAPAROTOMY LAPAROSCOPIC LYSIS OF ADHESIONS SMALL BOWEL RESECTION COLOSTOMY Advance diet Remove NGT Stop Invanz--patient did not have a pelvic abscess, just a pelvic hematoma which may be decompressing vaginally.  LOS: 15 days    Marta Lamas. [name], MD, FACS [phone] [facility] 08/26/2012

## 2012-08-26 NOTE — Consult Note (Signed)
Ostomy follow-up:  Pt familiar with pouching routines from previous surgery.  Stoma red and viable 1 1/4 inches, with small amt bloody stool. Applies 2 piece pouch with barrier ring. Peristomal skin intact without denuded areas. Pt denies questions regarding pouching routines, ordering supplies, or emptying.  Supplies at bedside for staff use.  Cammie Mcgee, RN, MSN, [employer]  [phone]

## 2012-08-27 LAB — COMPREHENSIVE METABOLIC PANEL
ALT: 54 U/L — ABNORMAL HIGH (ref 0–35)
AST: 44 U/L — ABNORMAL HIGH (ref 0–37)
Alkaline Phosphatase: 234 U/L — ABNORMAL HIGH (ref 39–117)
GFR calc Af Amer: >90 mL/min (ref >90)
Glucose, Bld: 105 mg/dL — ABNORMAL HIGH (ref 70–99)
Potassium: 3.8 mEq/L (ref 3.5–5.1)
Sodium: 131 mEq/L — ABNORMAL LOW (ref 135–145)
Total Protein: 7.6 g/dL (ref 6.0–8.3)

## 2012-08-27 MED ORDER — PANTOPRAZOLE SODIUM 40 MG PO TBEC
40.0000 mg | DELAYED_RELEASE_TABLET | Freq: Every day | ORAL | Status: DC
  Administered 2012-08-27 – 2012-08-29 (×3): 40 mg via ORAL
  Filled 2012-08-27 (×2): qty 1

## 2012-08-27 MED ORDER — TRACE MINERALS CR-CU-F-FE-I-MN-MO-SE-ZN IV SOLN
INTRAVENOUS | Status: AC
  Filled 2012-08-27: qty 2000

## 2012-08-27 MED ORDER — OXYCODONE-ACETAMINOPHEN 5-325 MG PO TABS
1.0000 | ORAL_TABLET | ORAL | Status: DC | PRN
Start: 2012-08-27 — End: 2012-08-29
  Administered 2012-08-27 – 2012-08-28 (×3): 2 via ORAL
  Administered 2012-08-28: 1 via ORAL
  Administered 2012-08-28 – 2012-08-29 (×3): 2 via ORAL
  Filled 2012-08-27 (×4): qty 2
  Filled 2012-08-27: qty 1
  Filled 2012-08-27 (×2): qty 2

## 2012-08-27 NOTE — Progress Notes (Signed)
16 Days Post-Op  Subjective: LLQ drain placed 11/30 Output increased today Feeling better  Objective: Vital signs in last 24 hours: Temp:  [98.3 F (36.8 C)] 98.3 F (36.8 C) (12/04 2123) Pulse Rate:  [74] 74  (12/04 2123) Resp:  [18] 18  (12/04 2123) BP: (101)/(66) 101/66 mmHg (12/04 2123) SpO2:  [100 %] 100 % (12/04 2123) Last BM Date: 08/26/12  Intake/Output from previous day: 12/04 0701 - 12/05 0700 In: 360 [P.O.:360] Out: 590 [Urine:400; Drains:190] Intake/Output this shift:    PE:  VSS; afeb Wbc wnl Drain intact Output 190 cc today! Dark bloody fluid Site clean and dry  Lab Results:   [facility] 08/26/12 0520  WBC 8.8  HGB 9.0*  HCT 27.2*  PLT 614*   BMET  Basename 08/27/12 0540 08/26/12 0520  NA 131* 128*  K 3.8 3.6  CL 98 96  CO2 26 25  GLUCOSE 105* 109*  BUN 13 14  CREATININE 0.50 0.47*  CALCIUM 12.3* 12.5*   PT/INR No results found for this basename: LABPROT:2,INR:2 in the last 72 hours ABG No results found for this basename: PHART:2,PCO2:2,PO2:2,HCO3:2 in the last 72 hours  Studies/Results: No results found.  Anti-infectives: Anti-infectives     Start     Dose/Rate Route Frequency Ordered Stop   08/13/12 1800   ertapenem (INVANZ) 1 g in sodium chloride 0.9 % 50 mL IVPB  Status:  Discontinued        1 g 100 mL/hr over 30 Minutes Intravenous Every 24 hours 08/13/12 1723 08/26/12 0939   08/11/12 1800   ciprofloxacin (CIPRO) IVPB 400 mg        400 mg 200 mL/hr over 60 Minutes Intravenous Every 12 hours 08/11/12 1746 08/11/12 2116   08/11/12 1800   metroNIDAZOLE (FLAGYL) IVPB 500 mg        500 mg 100 mL/hr over 60 Minutes Intravenous Every 8 hours 08/11/12 1746 08/12/12 0217   08/11/12 0600   metroNIDAZOLE (FLAGYL) IVPB 500 mg        500 mg 100 mL/hr over 60 Minutes Intravenous On call to O.R. 08/10/12 1428 08/11/12 1020   08/11/12 0600   ciprofloxacin (CIPRO) IVPB 400 mg        400 mg 200 mL/hr over 60 Minutes Intravenous On call  to O.R. 08/10/12 1428 08/11/12 1020          Assessment/Plan: s/p Procedure(s) (LRB) with comments: EXPLORATORY LAPAROTOMY (N/A) LAPAROSCOPIC LYSIS OF ADHESIONS (N/A) SMALL BOWEL RESECTION (N/A) COLOSTOMY (N/A)  LLQ abscess drain intact Will follow for now Will need output 10-15 cc/24 hrs and get new CT Plan per [facility]   LOS: 16 days    Vanessa [name] A 08/27/2012

## 2012-08-27 NOTE — Progress Notes (Signed)
GS Progress Note Subjective: Patient still having some lower suprapubic abdominal pain.  Tolerated PO diet well.  Stoma is still working.  States that she had a soft bowel movement from rectum.  Objective: Vital signs in last 24 hours: Temp:  [98 F (36.7 C)-98.3 F (36.8 C)] 98.3 F (36.8 C) (12/04 2123) Pulse Rate:  [74-75] 74  (12/04 2123) Resp:  [18] 18  (12/04 2123) BP: (101-108)/(64-66) 101/66 mmHg (12/04 2123) SpO2:  [100 %] 100 % (12/04 2123) Last BM Date: 08/26/12  Intake/Output from previous day: 12/04 0701 - 12/05 0700 In: 360 [P.O.:360] Out: 590 [Urine:400; Drains:190] Intake/Output this shift: Total I/O In: 120 [P.O.:120] Out: 95 [Drains:95]  Lungs: Clear  Abd: Soft, minimally tender, good bowel sounds.  Wound is granulating well  Extremities: No DVT signs or symptoms  Neuro: Intact  Lab Results: CBC   Basename 08/26/12 0520  WBC 8.8  HGB 9.0*  HCT 27.2*  PLT 614*   BMET  Basename 08/26/12 0520 Sep 16, 2012 0500  NA 128* 133*  K 3.6 3.7  CL 96 101  CO2 25 26  GLUCOSE 109* 104*  BUN 14 14  CREATININE 0.47* 0.49*  CALCIUM 12.5* 12.5*   PT/INR No results found for this basename: LABPROT:2,INR:2 in the last 72 hours ABG No results found for this basename: PHART:2,PCO2:2,PO2:2,HCO3:2 in the last 72 hours  Studies/Results: Dg Small Bowel  2012/09/16  *RADIOLOGY REPORT*  Clinical Data:Suspected obstruction of ileo-ileal anastomoses.  SMALL BOWEL FOLLOW-THROUGH EXAMINATION  Technique: Serial images of the small bowel were obtained including spot views of the terminal ileum.  Fluoroscopy Time: 0.4 minutes  Comparison: 08/24/2012 water soluble enema  Findings: Initial scout image of the abdomen demonstrates contrast medium in the colon extending to the descending colostomy, along with what is later recognizable as dilute contrast in the distal small bowel loops.  The patient was administered Omnipaque-300 water soluble contrast medium via the nasogastric  tube.  On the initial 20-minute image, contrast extended through most of the small bowel, but there was no significant differentially increased:  Contrast.  By 50 minutes, we demonstrate early additional filling of the colon, strongly favoring patency of the distal small bowel anastomosis.  By 1 hour and 40 minutes, there is clearly an increasing amount of contrast medium in the colon, indicating a patent anastomoses. Several mildly dilated loops of distal small bowel are present, measuring up to 3.8 cm.  I did obtain several spot images of the cecum to attempt to visualize the terminal ileum. However, given the recent surgery in this vicinity, I was did not apply pressure to the abdomen using the balloon paddle, and the patient remains very tender in this area.  There are loops of superimposed small bowel projecting over the cecum.  IMPRESSION:  1.  Following oral administration of barium, there is increase in overall volume of colonic contrast medium, essentially ruling out a high-grade small bowel obstruction.  There are some mildly dilated distal small bowel loops, best appreciated in the pelvis and left lower abdomen, favoring ileus over a low grade partial small-bowel obstruction. 2.  Please note that there was considerable residual contrast medium in the colon on the initial KUB; this in addition to in the patient's abdominal tenderness in the operative site prevents balloon compression of the ileocecal valve which makes it difficult to distinctly see the terminal ileum.   Original Report Authenticated By: Gaylyn Rong, M.D.     Anti-infectives: Anti-infectives     Start  Dose/Rate Route Frequency Ordered Stop   08/13/12 1800   ertapenem (INVANZ) 1 g in sodium chloride 0.9 % 50 mL IVPB  Status:  Discontinued        1 g 100 mL/hr over 30 Minutes Intravenous Every 24 hours 08/13/12 1723 08/26/12 0939   08/11/12 1800   ciprofloxacin (CIPRO) IVPB 400 mg        400 mg 200 mL/hr over 60 Minutes  Intravenous Every 12 hours 08/11/12 1746 08/11/12 2116   08/11/12 1800   metroNIDAZOLE (FLAGYL) IVPB 500 mg        500 mg 100 mL/hr over 60 Minutes Intravenous Every 8 hours 08/11/12 1746 08/12/12 0217   08/11/12 0600   metroNIDAZOLE (FLAGYL) IVPB 500 mg        500 mg 100 mL/hr over 60 Minutes Intravenous On call to O.R. 08/10/12 1428 08/11/12 1020   08/11/12 0600   ciprofloxacin (CIPRO) IVPB 400 mg        400 mg 200 mL/hr over 60 Minutes Intravenous On call to O.R. 08/10/12 1428 08/11/12 1020          Assessment/Plan: s/p Procedure(s): EXPLORATORY LAPAROTOMY LAPAROSCOPIC LYSIS OF ADHESIONS SMALL BOWEL RESECTION COLOSTOMY Advance diet Taper off TPN. Possible home next 48-72 hours.  LOS: 16 days    Marta Lamas. [name], MD, FACS [phone] [facility] 08/27/2012

## 2012-08-28 LAB — BASIC METABOLIC PANEL
BUN: 11 mg/dL (ref 6–23)
CO2: 26 mEq/L (ref 19–32)
Chloride: 99 mEq/L (ref 96–112)
Creatinine, Ser: 0.58 mg/dL (ref 0.50–1.10)
Glucose, Bld: 81 mg/dL (ref 70–99)

## 2012-08-28 LAB — CBC WITH DIFFERENTIAL/PLATELET
Basophils Relative: 2 % — ABNORMAL HIGH (ref 0–1)
Eosinophils Relative: 4 % (ref 0–5)
HCT: 25.1 % — ABNORMAL LOW (ref 36.0–46.0)
Hemoglobin: 8.5 g/dL — ABNORMAL LOW (ref 12.0–15.0)
Lymphs Abs: 1.7 10*3/uL (ref 0.7–4.0)
MCV: 91.3 fL (ref 78.0–100.0)
Monocytes Relative: 10 % (ref 3–12)
Neutro Abs: 5.3 10*3/uL (ref 1.7–7.7)
RDW: 14.1 % (ref 11.5–15.5)
WBC: 8.3 10*3/uL (ref 4.0–10.5)

## 2012-08-28 MED ORDER — OXYCODONE-ACETAMINOPHEN 5-325 MG PO TABS: 1.0000 | ORAL_TABLET | ORAL | Status: DC | PRN

## 2012-08-28 MED ORDER — BOOST / RESOURCE BREEZE PO LIQD: 1.0000 | Freq: Two times a day (BID) | ORAL | Status: DC

## 2012-08-28 MED ORDER — METOCLOPRAMIDE HCL 5 MG/ML IJ SOLN
5.0000 mg | Freq: Two times a day (BID) | INTRAMUSCULAR | Status: DC
  Administered 2012-08-28 – 2012-08-29 (×2): 5 mg via INTRAVENOUS
  Filled 2012-08-28 (×3): qty 1

## 2012-08-28 MED ORDER — MORPHINE SULFATE 2 MG/ML IJ SOLN: 2.0000 mg | Freq: Once | INTRAMUSCULAR | Status: DC

## 2012-08-28 NOTE — Progress Notes (Signed)
UR completed 

## 2012-08-28 NOTE — Progress Notes (Deleted)
No change in pt since initial assessment

## 2012-08-28 NOTE — Progress Notes (Signed)
17 Days Post-Op  Subjective: LLQ abscess drain placed 11/30 Improving daily Output still significant  Objective: Vital signs in last 24 hours: Temp:  [98.3 F (36.8 C)-98.7 F (37.1 C)] 98.4 F (36.9 C) (12/06 0500) Pulse Rate:  [72-77] 77  (12/06 0500) Resp:  [16-17] 16  (12/06 0500) BP: (109-114)/(62-68) 110/64 mmHg (12/06 0500) SpO2:  [100 %] 100 % (12/06 0500) Last BM Date: 08/28/12  Intake/Output from previous day: 12/05 0701 - 12/06 0700 In: 0  Out: 180 [Drains:180] Intake/Output this shift: Total I/O In: 240 [P.O.:240] Out: 100 [Drains:100]  PE:  Afeb; vss Wbc wnl Output 180 cc 12/5 40 cc 12/6: greenish brown output Neg Cx Site clean and dry   Lab Results:   Basename 08/28/12 0500 08/26/12 0520  WBC 8.3 8.8  HGB 8.5* 9.0*  HCT 25.1* 27.2*  PLT 621* 614*   BMET  Basename 08/28/12 0500 08/27/12 0540  NA 133* 131*  K 3.9 3.8  CL 99 98  CO2 26 26  GLUCOSE 81 105*  BUN 11 13  CREATININE 0.58 0.50  CALCIUM 12.8* 12.3*   PT/INR No results found for this basename: LABPROT:2,INR:2 in the last 72 hours ABG No results found for this basename: PHART:2,PCO2:2,PO2:2,HCO3:2 in the last 72 hours  Studies/Results: No results found.  Anti-infectives:   Assessment/Plan: s/p Procedure(s) (LRB) with comments: EXPLORATORY LAPAROTOMY (N/A) LAPAROSCOPIC LYSIS OF ADHESIONS (N/A) SMALL BOWEL RESECTION (N/A) COLOSTOMY (N/A)   LLQ abs drain in place Output significant Need CT when output 10-15 cc/24 hrs   LOS: 17 days    Shianna Bally A 08/28/2012

## 2012-08-28 NOTE — Progress Notes (Signed)
Nutrition Follow-up  Intervention:   Resource Breeze po BID, each supplement provides 250 kcal and 9 grams of protein. RD to follow for nutrition care plan  Assessment:   Patient s/p procedures 11/19: EXPLORATORY LAPAROTOMY  EXTENSIVE ENTEROLYSIS OF ADHESIONS  SMALL BOWEL RESECTION  COLOSTOMY  RIGID SIGMOIDOSCOPY   NGT removed 12/4. Per surgery note on 12/5, pt is tolerating oral diet well. TPN discontinued. Pt confirms that she is tolerating diet well. Does not like Ensure products.  Diet Order:  Full  Liquids (12/5)  Meds: Scheduled Meds:    . fentaNYL  25 mcg Transdermal Q72H  . lidocaine-EPINEPHrine  20 mL Other Once  . [COMPLETED] metoCLOPramide (REGLAN) injection  5 mg Intravenous Q6H  . pantoprazole  40 mg Oral Q1200  . [DISCONTINUED] chlorhexidine  15 mL Mouth Rinse BID  . [DISCONTINUED]  morphine injection  2 mg Intravenous Once   Continuous Infusions:    . [EXPIRED] fat emulsion 240 mL (08/26/12 1742)  . [EXPIRED] TPN (CLINIMIX) +/- additives 40 mL/hr at 08/27/12 0640   PRN Meds:.HYDROmorphone (DILAUDID) injection, oxyCODONE-acetaminophen, silver nitrate applicators, sodium chloride   CMP     Component Value Date/Time   NA 133* 08/28/2012 0500   K 3.9 08/28/2012 0500   CL 99 08/28/2012 0500   CO2 26 08/28/2012 0500   GLUCOSE 81 08/28/2012 0500   BUN 11 08/28/2012 0500   CREATININE 0.58 08/28/2012 0500   CALCIUM 12.8* 08/28/2012 0500   CALCIUM 10.7* 02/01/2012 0705   PROT 7.6 08/27/2012 0540   ALBUMIN 2.6* 08/27/2012 0540   AST 44* 08/27/2012 0540   ALT 54* 08/27/2012 0540   ALKPHOS 234* 08/27/2012 0540   BILITOT 0.2* 08/27/2012 0540   GFRNONAA >90 08/28/2012 0500   GFRAA >90 08/28/2012 0500    Phosphorus  Date Value Range Status  08/27/2012 2.5  2.3 - 4.6 mg/dL Final    Magnesium  Date Value Range Status  08/27/2012 1.7  1.5 - 2.5 mg/dL Final    Prealbumin  Date Value Range Status  08/24/2012 22.7  17.0 - 34.0 mg/dL Final     Intake/Output Summary (Last  24 hours) at 08/28/12 1022 Last data filed at 08/28/12 0848  Gross per 24 hour  Intake    240 ml  Output    180 ml  Net     60 ml  BM 12/6  Weight Status:  65.2 kg (11/19) -- no new weight available  Re-estimated needs:  1800-1950 kcals, 90-100 gm protein  Nutrition Dx:  Inadequate Oral Intake r/t altered GI function as evidenced by slow diet advancement.  Goal:  TPN to meet >90% of estimated protein needs, maximize energy provision as able during national lipid backorder, no longer applicable  New Goal: Pt to meet >/= 90% of their estimated nutrition needs  Monitor:  TPN prescription, PO diet advancement, weight, labs, I/O's  Jarold Motto MS, RD, LDN Pager: [phone] After-hours pager: [phone]

## 2012-08-28 NOTE — Progress Notes (Signed)
GS Progress Note Subjective: Doing better.  TPN is off.  Patient comfortable.  Greenish drainage from per drain now.  180cc yesterday.  Objective: Vital signs in last 24 hours: Temp:  [98.3 F (36.8 C)-98.7 F (37.1 C)] 98.4 F (36.9 C) (12/06 0500) Pulse Rate:  [72-77] 77  (12/06 0500) Resp:  [16-17] 16  (12/06 0500) BP: (109-114)/(62-68) 110/64 mmHg (12/06 0500) SpO2:  [100 %] 100 % (12/06 0500) Last BM Date: 08/28/12  Intake/Output from previous day: 12/05 0701 - 12/06 0700 In: 0  Out: 180 [Drains:180] Intake/Output this shift: Total I/O In: 240 [P.O.:240] Out: 40 [Drains:40]  Lungs: Clear  Abd: Soft, good bowel sounds.  Colsotomy is functioning well  Extremities: No DVt signs or symptoms.  Neuro: Intact  Lab Results: CBC   Basename 08/28/12 0500 08/26/12 0520  WBC 8.3 8.8  HGB 8.5* 9.0*  HCT 25.1* 27.2*  PLT 621* 614*   BMET  Basename 08/28/12 0500 08/27/12 0540  NA 133* 131*  K 3.9 3.8  CL 99 98  CO2 26 26  GLUCOSE 81 105*  BUN 11 13  CREATININE 0.58 0.50  CALCIUM 12.8* 12.3*   PT/INR No results found for this basename: LABPROT:2,INR:2 in the last 72 hours ABG No results found for this basename: PHART:2,PCO2:2,PO2:2,HCO3:2 in the last 72 hours  Studies/Results: No results found.  Anti-infectives: Anti-infectives     Start     Dose/Rate Route Frequency Ordered Stop   08/13/12 1800   ertapenem (INVANZ) 1 g in sodium chloride 0.9 % 50 mL IVPB  Status:  Discontinued        1 g 100 mL/hr over 30 Minutes Intravenous Every 24 hours 08/13/12 1723 08/26/12 0939   08/11/12 1800   ciprofloxacin (CIPRO) IVPB 400 mg        400 mg 200 mL/hr over 60 Minutes Intravenous Every 12 hours 08/11/12 1746 08/11/12 2116   08/11/12 1800   metroNIDAZOLE (FLAGYL) IVPB 500 mg        500 mg 100 mL/hr over 60 Minutes Intravenous Every 8 hours 08/11/12 1746 08/12/12 0217   08/11/12 0600   metroNIDAZOLE (FLAGYL) IVPB 500 mg        500 mg 100 mL/hr over 60 Minutes  Intravenous On call to O.R. 08/10/12 1428 08/11/12 1020   08/11/12 0600   ciprofloxacin (CIPRO) IVPB 400 mg        400 mg 200 mL/hr over 60 Minutes Intravenous On call to O.R. 08/10/12 1428 08/11/12 1020          Assessment/Plan: s/p Procedure(s): EXPLORATORY LAPAROTOMY LAPAROSCOPIC LYSIS OF ADHESIONS SMALL BOWEL RESECTION COLOSTOMY Advance diet Plan for discharge tomorrow Has drian in place which she will likely get to take home with her tomorrow.  LOS: 17 days    Marta Lamas. [name], MD, FACS [phone] [facility] 08/28/2012

## 2012-08-28 NOTE — Discharge Summary (Signed)
Physician Discharge Summary  Patient ID: Vanessa [name] MRN: [id number] DOB/AGE: 09-23-1976 36 y.o.  Admit date: 08/11/2012 Discharge date: 08/28/2012  Admission Diagnoses:  Discharge Diagnoses:  Active Problems:  * No active hospital problems. 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  I ended up converting her ileostomy to a colostomy which did not open up for several days postoperatively.    She developed a pelvic fluid collection which originally drained old bloody fluid, but is now draining slightly bilious contents and may represent a fistula.  She is eating normally, and her colostomy is working, putting out gas and stool.  Her surgical wounds were left open at the time of surgery, and arre improving with wet-to-dry dressing with normal saline.  Her stoma is viable and not retracted  Consults: interventinal radiology  Significant Diagnostic Studies: radiology: CXR: normal, CT scan: abdomen and pelvic with contrast and SBFT and gastrograffing enema through the stoma  Treatments: antibiotics: Invanz and percutaneous drainage of pelvic fluid collection.  Discharge Exam: Blood pressure 104/60, pulse 86, temperature 98.2 F (36.8 C), temperature source Oral, resp. rate 16, height 5\' 4"  (1.626 m), weight 65.2 kg (143 lb 11.8 oz), last menstrual period 04/10/2009, SpO2 100.00%. General appearance: alert, cooperative and no distress GI: soft, non-tender; bowel sounds normal; no masses,  no organomegaly and open wound in midline.  Percutaneous drain in left posterior flank area  Disposition: 01-Home or Self Care  Discharge Orders    Future Orders Please Complete By Expires   Diet general      Increase activity slowly      Discharge instructions      Comments:   Will need home health for drain care and wound care.  Keep output from drain and stoma accurately.   Driving  Restrictions      Comments:   No driving for 3 weeks.   Lifting restrictions      Comments:   No lifting > 20 pounds for the next month   Sexual Activity Restrictions      Comments:   Until pain is well controlled.   Change dressing (specify)      Comments:   Dressing change: abdominal wound dressing one  times per day using wet to dry with saline and 4x4 gauze..   Call MD for:  extreme fatigue      Call MD for:  persistant dizziness or light-headedness      Call MD for:  hives      Call MD for:  difficulty breathing, headache or visual disturbances      Call MD for:  redness, tenderness, or signs of infection (pain, swelling, redness, odor or green/yellow discharge around incision site)      Call MD for:  severe uncontrolled pain      Call MD for:  persistant nausea and vomiting      Call MD for:  temperature >100.4          Medication List     As of 08/28/2012  5:10 PM    STOP taking these medications         oxyCODONE-acetaminophen 10-325 MG per tablet   Commonly known as: PERCOCET      pantoprazole 40 MG tablet   Commonly known as: PROTONIX      TAKE these medications         oxyCODONE-acetaminophen 5-325 MG per tablet  Commonly known as: PERCOCET/ROXICET   Take 1-2 tablets by mouth every 4 (four) hours as needed.      prochlorperazine 5 MG tablet   Commonly known as: COMPAZINE   Take 1 tablet (5 mg total) by mouth 4 (four) times daily -  before meals and at bedtime.           Follow-up Information    Follow up with Roslind Michaux, Marta Lamas, MD. In 3 weeks.   Contact information:   [address] [place] [address] [phone]          Signed: Cherylynn Ridges 08/28/2012, 5:10 PM

## 2012-08-29 MED ORDER — OXYCODONE-ACETAMINOPHEN 10-325 MG PO TABS: 1.0000 | ORAL_TABLET | ORAL | Status: DC | PRN

## 2012-08-29 MED ORDER — ONDANSETRON HCL 4 MG/2ML IJ SOLN
4.0000 mg | Freq: Four times a day (QID) | INTRAMUSCULAR | Status: DC | PRN
  Administered 2012-08-29: 4 mg via INTRAVENOUS
  Filled 2012-08-29: qty 2

## 2012-08-29 NOTE — Progress Notes (Signed)
Order received to d/c PICC line for d/c home.  Pt verbalized understanding of site care, signs of infection and interventions for bleeding using teach back method.  No ADN.  Verbalizes understanding of staying in bed 30 minutes.  Family /friends at bedside also aware.

## 2012-08-29 NOTE — Progress Notes (Signed)
Agree with above 

## 2012-08-29 NOTE — Progress Notes (Signed)
Patient ID: Vanessa [name], female   DOB: 03-07-1976, 36 y.o.   MRN: [id number]  Subjective: She appears comfortable, NAD, TPN off. Small amount of greenish drainage in JP bulb.  Objective: Vital signs in last 24 hours: Temp:  [98.2 F (36.8 C)-99.5 F (37.5 C)] 99.5 F (37.5 C) (12/07 0546) Pulse Rate:  [69-86] 70  (12/07 0546) Resp:  [12-16] 16  (12/07 0546) BP: (101-104)/(60) 101/60 mmHg (12/07 0546) SpO2:  [97 %-100 %] 97 % (12/07 0546) Last BM Date: 08/29/12  Intake/Output from previous day: 12/06 0701 - 12/07 0700 In: 720 [P.O.:720] Out: 313 [Drains:313] Intake/Output this shift: Total I/O In: 10 [I.V.:10] Out: -   General appearance:  Chest CTA Abd: Soft, good bowel sounds.  Colsotomy is functioning well Open abdominal wound w/o erythema or drainage wet to dry drsg in place. Extremities: No edema or tenderness, + Pulses VSS, Tmax 99.5 Labs: Hypercalcemia (probably secondary to TPN)  Lab Results: CBC   Basename 08/28/12 0500  WBC 8.3  HGB 8.5*  HCT 25.1*  PLT 621*   BMET  Basename 08/28/12 0500 08/27/12 0540  NA 133* 131*  K 3.9 3.8  CL 99 98  CO2 26 26  GLUCOSE 81 105*  BUN 11 13  CREATININE 0.58 0.50  CALCIUM 12.8* 12.3*   PT/INR No results found for this basename: LABPROT:2,INR:2 in the last 72 hours ABG No results found for this basename: PHART:2,PCO2:2,PO2:2,HCO3:2 in the last 72 hours  Studies/Results: No results found.  Anti-infectives: Anti-infectives     Start     Dose/Rate Route Frequency Ordered Stop   08/13/12 1800   ertapenem (INVANZ) 1 g in sodium chloride 0.9 % 50 mL IVPB  Status:  Discontinued        1 g 100 mL/hr over 30 Minutes Intravenous Every 24 hours 08/13/12 1723 08/26/12 0939   08/11/12 1800   ciprofloxacin (CIPRO) IVPB 400 mg        400 mg 200 mL/hr over 60 Minutes Intravenous Every 12 hours 08/11/12 1746 08/11/12 2116   08/11/12 1800   metroNIDAZOLE (FLAGYL) IVPB 500 mg        500 mg 100 mL/hr over 60  Minutes Intravenous Every 8 hours 08/11/12 1746 08/12/12 0217   08/11/12 0600   metroNIDAZOLE (FLAGYL) IVPB 500 mg        500 mg 100 mL/hr over 60 Minutes Intravenous On call to O.R. 08/10/12 1428 08/11/12 1020   08/11/12 0600   ciprofloxacin (CIPRO) IVPB 400 mg        400 mg 200 mL/hr over 60 Minutes Intravenous On call to O.R. 08/10/12 1428 08/11/12 1020          Assessment/Plan: s/p Procedure(s): EXPLORATORY LAPAROTOMY LAPAROSCOPIC LYSIS OF ADHESIONS SMALL BOWEL RESECTION COLOSTOMY Probable discharge today Drain to remain in place until follow up wit Dr. [name]   LOS: 18 days   Blenda Mounts [facility] Pager # [phone]  08/29/2012

## 2012-08-29 NOTE — Progress Notes (Signed)
Pt discharged to home accomp by family.  All discharge instructions reviewed and given to pt as well as Rx for Percocet 10/325 mg.  [facility] called to notify of discharge today.  Pt understands follow up in 3 weeks Dr. [name] and the # to call if any problems, questions or concerns.  FU for ostomy care and wound care, drain care for home.

## 2012-09-03 ENCOUNTER — Telehealth (INDEPENDENT_AMBULATORY_CARE_PROVIDER_SITE_OTHER): Payer: Self-pay

## 2012-09-03 ENCOUNTER — Other Ambulatory Visit (INDEPENDENT_AMBULATORY_CARE_PROVIDER_SITE_OTHER): Payer: Self-pay

## 2012-09-03 DIAGNOSIS — G8918 Other acute postprocedural pain: Secondary | ICD-10-CM

## 2012-09-03 MED ORDER — OXYCODONE-ACETAMINOPHEN 10-325 MG PO TABS: 1.0000 | ORAL_TABLET | ORAL | Status: DC | PRN

## 2012-09-03 NOTE — Telephone Encounter (Signed)
Called Vanessa [name] and left message with family that her prescription is ready for pick up at front desk.

## 2012-09-03 NOTE — Telephone Encounter (Signed)
The patient called to check on her refill.  This is the # she can be reached at [phone].

## 2012-09-09 ENCOUNTER — Telehealth (INDEPENDENT_AMBULATORY_CARE_PROVIDER_SITE_OTHER): Payer: Self-pay

## 2012-09-09 NOTE — Telephone Encounter (Signed)
Patient calling into office requesting refill for pain medication.  Patient last refill was on Thursday 09/03/12 percocet 10-325 mg, #60.  Patient advised she may need an office appointment prior to refill.  Call was disconnected due to poor reception.  Called patient back and advised that refill request has been forwarded to Dr. [name] for review and we will call patient when decision has been made.

## 2012-09-09 NOTE — Telephone Encounter (Signed)
Per Dr. [name] okay to refill Oxycodone 10-325mg , take 1-2 tablets q4hrs prn for pain, #30.

## 2012-09-09 NOTE — Telephone Encounter (Signed)
Patient notified of pain medication refill.  Prescription at front desk for patient pick up.

## 2012-09-17 ENCOUNTER — Telehealth (INDEPENDENT_AMBULATORY_CARE_PROVIDER_SITE_OTHER): Payer: Self-pay | Admitting: General Surgery

## 2012-09-17 NOTE — Telephone Encounter (Signed)
Dr. [name] came to office and wrote Rx for Percocet 10/325 mg, # 20, 1 po Q6H prn pain, no refill.  Called pt to pick up Rx at the front desk.  She understands.

## 2012-09-17 NOTE — Telephone Encounter (Signed)
Pt called to request another pain med refill.  Last filled (on 08/11/12) was Oxycodone 10/325 mg.  Paged Dr. [name] for orders.

## 2012-09-22 ENCOUNTER — Ambulatory Visit (INDEPENDENT_AMBULATORY_CARE_PROVIDER_SITE_OTHER): Payer: Medicaid Other | Admitting: General Surgery

## 2012-09-22 ENCOUNTER — Encounter (INDEPENDENT_AMBULATORY_CARE_PROVIDER_SITE_OTHER): Payer: Self-pay | Admitting: General Surgery

## 2012-09-22 VITALS — BP 110/72 | HR 102 | Temp 98.4°F | Resp 16 | Ht 65.0 in | Wt 110.6 lb

## 2012-09-22 DIAGNOSIS — Z09 Encounter for follow-up examination after completed treatment for conditions other than malignant neoplasm: Secondary | ICD-10-CM

## 2012-09-22 MED ORDER — ONDANSETRON HCL 4 MG PO TABS: 4.0000 mg | ORAL_TABLET | Freq: Three times a day (TID) | ORAL | Status: DC | PRN

## 2012-09-22 MED ORDER — OXYCODONE-ACETAMINOPHEN 10-325 MG PO TABS: 1.0000 | ORAL_TABLET | Freq: Four times a day (QID) | ORAL | Status: DC | PRN

## 2012-09-22 NOTE — Progress Notes (Signed)
The patient continues to drain a fair amount of fecal material from her percutaneous drain.  She states that she's had some bloody drainage from her rectum and also some food particles.  On examination her midline wound is almost completely healed. It only requires a shower soap and water and antibiotic ointment coverage.  Her colostomy appears to be functioning well.  With the continued drainage from the percutaneous drain the patient requires a repeat CT scan of her abdomen and pelvis. This will be with oral and IV contrast. Based on those findings will help [place] determine when the drain can be removed or whether not a drain injection will be necessary. The drainage is foul smelling at this time and perhaps represents a fistula of some type.  I will reorder her pain medicines and antinausea medicines.

## 2012-09-22 NOTE — Addendum Note (Signed)
Addended by: Brennan Bailey on: 09/22/2012 10:26 AM   Modules accepted: Orders

## 2012-09-24 ENCOUNTER — Telehealth (INDEPENDENT_AMBULATORY_CARE_PROVIDER_SITE_OTHER): Payer: Self-pay

## 2012-09-24 NOTE — Telephone Encounter (Signed)
The nurse is at the pt's home and the pt told her she didn't need to pack the midline wound but to apply antibiotic ointment.  I confirmed that from Dr Dixon Boos note.  She will change her order.

## 2012-09-28 ENCOUNTER — Telehealth (INDEPENDENT_AMBULATORY_CARE_PROVIDER_SITE_OTHER): Payer: Self-pay | Admitting: General Surgery

## 2012-09-28 ENCOUNTER — Ambulatory Visit
Admission: RE | Admit: 2012-09-28 | Discharge: 2012-09-28 | Disposition: A | Discharge status: Home / Self Care | Payer: Medicaid Other | Source: Ambulatory Visit | Attending: General Surgery | Admitting: General Surgery

## 2012-09-28 DIAGNOSIS — Z09 Encounter for follow-up examination after completed treatment for conditions other than malignant neoplasm: Secondary | ICD-10-CM

## 2012-09-28 MED ORDER — IOHEXOL 300 MG/ML  SOLN
100.0000 mL | Freq: Once | INTRAMUSCULAR | Status: AC | PRN
  Administered 2012-09-28: 100 mL via INTRAVENOUS

## 2012-09-28 NOTE — Telephone Encounter (Signed)
Pt called in wanting a refill on her Rx for oxycodone.  I paged Dr. [name] and he said she could have a refill of her Rx oxy-acetaminophen 10-325 mg 1 tab Q6H prn #30 no refills.  I had Dr. Michaell Cowing sign off on this Rx for Dr. [name].  I called the patient back and informed her that the Rx is ready for pick-up at the front desk.

## 2012-10-04 ENCOUNTER — Encounter (HOSPITAL_COMMUNITY): Payer: Self-pay | Admitting: Emergency Medicine

## 2012-10-04 ENCOUNTER — Emergency Department (HOSPITAL_COMMUNITY)
Admission: EM | Admit: 2012-10-04 | Discharge: 2012-10-04 | Disposition: A | Discharge status: Home / Self Care | Payer: Medicaid Other | Attending: Emergency Medicine | Admitting: Emergency Medicine

## 2012-10-04 DIAGNOSIS — G8929 Other chronic pain: Secondary | ICD-10-CM | POA: Insufficient documentation

## 2012-10-04 DIAGNOSIS — Z923 Personal history of irradiation: Secondary | ICD-10-CM | POA: Insufficient documentation

## 2012-10-04 DIAGNOSIS — I10 Essential (primary) hypertension: Secondary | ICD-10-CM | POA: Insufficient documentation

## 2012-10-04 DIAGNOSIS — Z9089 Acquired absence of other organs: Secondary | ICD-10-CM | POA: Insufficient documentation

## 2012-10-04 DIAGNOSIS — Z862 Personal history of diseases of the blood and blood-forming organs and certain disorders involving the immune mechanism: Secondary | ICD-10-CM | POA: Insufficient documentation

## 2012-10-04 DIAGNOSIS — R109 Unspecified abdominal pain: Secondary | ICD-10-CM | POA: Insufficient documentation

## 2012-10-04 DIAGNOSIS — F1210 Cannabis abuse, uncomplicated: Secondary | ICD-10-CM | POA: Insufficient documentation

## 2012-10-04 DIAGNOSIS — R112 Nausea with vomiting, unspecified: Secondary | ICD-10-CM | POA: Insufficient documentation

## 2012-10-04 LAB — COMPREHENSIVE METABOLIC PANEL
AST: 21 U/L (ref 0–37)
Albumin: 2.1 g/dL — ABNORMAL LOW (ref 3.5–5.2)
Alkaline Phosphatase: 110 U/L (ref 39–117)
CO2: 29 mEq/L (ref 19–32)
Chloride: 101 mEq/L (ref 96–112)
Potassium: 2.9 mEq/L — ABNORMAL LOW (ref 3.5–5.1)
Total Bilirubin: 0.2 mg/dL — ABNORMAL LOW (ref 0.3–1.2)

## 2012-10-04 LAB — CBC WITH DIFFERENTIAL/PLATELET
Basophils Absolute: 0 10*3/uL (ref 0.0–0.1)
Basophils Relative: 0 % (ref 0–1)
Hemoglobin: 11.5 g/dL — ABNORMAL LOW (ref 12.0–15.0)
Lymphocytes Relative: 16 % (ref 12–46)
MCHC: 33.4 g/dL (ref 30.0–36.0)
Monocytes Relative: 11 % (ref 3–12)
Neutro Abs: 5.2 10*3/uL (ref 1.7–7.7)
Neutrophils Relative %: 71 % (ref 43–77)
WBC: 7.3 10*3/uL (ref 4.0–10.5)

## 2012-10-04 LAB — URINALYSIS, ROUTINE W REFLEX MICROSCOPIC
Nitrite: NEGATIVE
Protein, ur: NEGATIVE mg/dL
Urobilinogen, UA: 1 mg/dL (ref 0.0–1.0)

## 2012-10-04 LAB — POCT I-STAT TROPONIN I: Troponin i, poc: 0 ng/mL (ref 0.00–0.08)

## 2012-10-04 LAB — LIPASE, BLOOD: Lipase: 5 U/L — ABNORMAL LOW (ref 11–59)

## 2012-10-04 MED ORDER — HYDROMORPHONE HCL 4 MG PO TABS: 4.0000 mg | ORAL_TABLET | ORAL | Status: DC | PRN

## 2012-10-04 MED ORDER — HYDROMORPHONE HCL PF 1 MG/ML IJ SOLN
1.0000 mg | Freq: Once | INTRAMUSCULAR | Status: AC
  Administered 2012-10-04: 1 mg via INTRAVENOUS
  Filled 2012-10-04: qty 1

## 2012-10-04 MED ORDER — ONDANSETRON HCL 4 MG/2ML IJ SOLN
4.0000 mg | Freq: Once | INTRAMUSCULAR | Status: AC
  Administered 2012-10-04: 4 mg via INTRAVENOUS
  Filled 2012-10-04: qty 2

## 2012-10-04 MED ORDER — SODIUM CHLORIDE 0.9 % IV BOLUS (SEPSIS)
1000.0000 mL | Freq: Once | INTRAVENOUS | Status: AC
  Administered 2012-10-04: 1000 mL via INTRAVENOUS

## 2012-10-04 MED ORDER — PROMETHAZINE HCL 25 MG PO TABS: 25.0000 mg | ORAL_TABLET | Freq: Four times a day (QID) | ORAL | Status: DC | PRN

## 2012-10-04 MED ORDER — POTASSIUM CHLORIDE CRYS ER 20 MEQ PO TBCR
40.0000 meq | EXTENDED_RELEASE_TABLET | Freq: Once | ORAL | Status: AC
  Administered 2012-10-04: 40 meq via ORAL
  Filled 2012-10-04: qty 2

## 2012-10-04 MED ORDER — LORAZEPAM 2 MG/ML IJ SOLN
1.0000 mg | Freq: Once | INTRAMUSCULAR | Status: AC
  Administered 2012-10-04: 1 mg via INTRAVENOUS
  Filled 2012-10-04: qty 1

## 2012-10-04 MED ORDER — SODIUM CHLORIDE 0.9 % IV BOLUS (SEPSIS): 1000.0000 mL | Freq: Once | INTRAVENOUS | Status: DC

## 2012-10-04 NOTE — ED Notes (Signed)
AVW:[id number]<XB> Expected date:<BR> Expected time:<BR> Means of arrival:Ambulance<BR> Comments:<BR> ems - abd pain

## 2012-10-04 NOTE — ED Notes (Signed)
Per EMS: Pt had a colostomy placed on August 11, 2012. Pt denies any complaints or problems, however now c/o of 10/10 lower abdominal pain that comes in waves. Pt states she hasn't been able to eat foods for the past few days due to nausea, pt has attempted oral Zofran. 4 mg of Zofran given via IV en route to ED.

## 2012-10-04 NOTE — ED Provider Notes (Signed)
History     CSN: [id number]  Arrival date & time 10/04/12  1554   First MD Initiated Contact with Patient 10/04/12 1605      Chief Complaint  Patient presents with  . Abdominal Pain    (Consider location/radiation/quality/duration/timing/severity/associated sxs/prior treatment) HPI 37 year old female with a past medical history significant for cervical cancer, radiation to the pelvis, radiation complications, ileostomy, and recent resection 2 colostomy on 08/11/2012 presents today with chief complaint of severe abdominal pain, nausea, vomiting.  The patient has a known pelvic abscess.  She's been followed by Dr. [name] at [facility].  Patient has had chronic abdominal pain.  She had an outpatient CT scan that showed some resolution of her abscess but chronic inflammatory processes.  The patient states that her pain was better but she developed nausea and vomiting 4 days ago.  She developed severe abdominal pain over the past 2 days and stayed home and tried to draw her symptoms with her pain medications.  She has been unable to hold down any fluids or foods for the past 4 days. Past Medical History  Diagnosis Date  . History of blood transfusion     "I had ~ 7 in 01/2012"  . Anemia   . Cervical cancer ~ 2011    cervical  . Radiation Nov.18,2011-Jan.23,2012    cervix  . Hypertension     no treatment necessary for BP  since July 2013    Past Surgical History  Procedure Date  . Radiation implants ~ 2011    "for cervical cancer"  . Colonoscopy 01/30/2012    Procedure: COLONOSCOPY;  Surgeon: Beverley Fiedler, MD;  Location: [facility] ENDOSCOPY;  Service: Gastroenterology;  Laterality: N/A;  . Colonoscopy 01/30/2012    Procedure: COLONOSCOPY;  Surgeon: Beverley Fiedler, MD;  Location: [facility] OR;  Service: Gastroenterology;  Laterality: N/A;  . Colostomy revision 02/04/2012    Procedure: COLON RESECTION SIGMOID;  Surgeon: Cherylynn Ridges, MD;  Location: [facility] OR;  Service: General;  Laterality: N/A;    . Cholecystectomy 2006  . Laparotomy 08/11/2012    Procedure: EXPLORATORY LAPAROTOMY;  Surgeon: Cherylynn Ridges, MD;  Location: [facility] OR;  Service: General;  Laterality: N/A;  . Laparoscopic lysis of adhesions 08/11/2012    Procedure: LAPAROSCOPIC LYSIS OF ADHESIONS;  Surgeon: Cherylynn Ridges, MD;  Location: [facility] OR;  Service: General;  Laterality: N/A;  . Bowel resection 08/11/2012    Procedure: SMALL BOWEL RESECTION;  Surgeon: Cherylynn Ridges, MD;  Location: [facility] OR;  Service: General;  Laterality: N/A;  . Colostomy 08/11/2012    Procedure: COLOSTOMY;  Surgeon: Cherylynn Ridges, MD;  Location: [facility] OR;  Service: General;  Laterality: N/A;    No family history on file.  History  Substance Use Topics  . Smoking status: Former Smoker -- 0.5 packs/day for .5 years    Types: Cigarettes    Quit date: 01/26/2012  . Smokeless tobacco: Never Used  . Alcohol Use: 0.0 oz/week     Comment: 03/16/12 "haven't drank in ~ 2-3 years"    OB History    Grav Para Term Preterm Abortions TAB SAB Ect Mult Living   10 8              Review of Systems Ten systems reviewed and are negative for acute change, except as noted in the HPI.   Allergies  Penicillins; Labetalol hcl; and Morphine and related  Home Medications   Current Outpatient Rx  Name  Route  Sig  Dispense  Refill  . ONDANSETRON HCL 4 MG PO TABS   Oral   Take 1 tablet (4 mg total) by mouth every 8 (eight) hours as needed for nausea.   30 tablet   0   . OXYCODONE-ACETAMINOPHEN 10-325 MG PO TABS   Oral   Take 1 tablet by mouth every 6 (six) hours as needed for pain.   30 tablet   0   . PROCHLORPERAZINE MALEATE 5 MG PO TABS   Oral   Take 1 tablet (5 mg total) by mouth 4 (four) times daily -  before meals and at bedtime.   30 tablet   1     BP 118/69  Pulse 96  Temp 97.3 F (36.3 C) (Oral)  Resp 18  SpO2 97%  Physical Exam  Nursing note and vitals reviewed. Constitutional: She is oriented to person, place, and time.        Ill-appearing female.  She appears to be very uncomfortable.  She is tearful.  She appears clinically dehydrated with dry mucous membranes, dry flaking lips.  HENT:  Head: Normocephalic and atraumatic.  Eyes: Conjunctivae normal are normal. No scleral icterus.  Neck: Normal range of motion.  Cardiovascular: Normal rate, regular rhythm and normal heart sounds.  Exam reveals no gallop and no friction rub.   No murmur heard. Pulmonary/Chest: Effort normal and breath sounds normal. No respiratory distress.  Abdominal: Soft. Bowel sounds are normal.       Colostomy is intact.  No sign of infection surrounding  Neurological: She is alert and oriented to person, place, and time.  Skin: Skin is warm and dry.    ED Course  Procedures (including critical care time)  Labs Reviewed  CBC WITH DIFFERENTIAL - Abnormal; Notable for the following:    RBC 3.83 (*)     Hemoglobin 11.5 (*)     HCT 34.4 (*)     RDW 15.7 (*)     Platelets 484 (*)     All other components within normal limits  COMPREHENSIVE METABOLIC PANEL - Abnormal; Notable for the following:    Potassium 2.9 (*)     Creatinine, Ser 0.45 (*)     Total Protein 5.8 (*)     Albumin 2.1 (*)     Total Bilirubin 0.2 (*)     All other components within normal limits  POCT I-STAT TROPONIN I  URINALYSIS, ROUTINE W REFLEX MICROSCOPIC  LIPASE, BLOOD   No results found.   No diagnosis found.    MDM  5:52 PM BP 118/69  Pulse 96  Temp 97.3 F (36.3 C) (Oral)  Resp 18  SpO2 97% Patient with known intrabdominal abscess. Labs are pending. Will consult with [facility] and manage sxs.    7:05 PM Labs are back and appear to show marked dehydration.  I have redosed her dilauded as her pain is recurring.  Dr. Purnell Shoemaker form [facility] has spoken to me he will consult with Dr. [name].  7:55 PM I have spoken with Dr. Purnell Shoemaker. The patietn has been tolerating PO fluids and kept down PO potassium, however she states that her pain is still  intolerable.  Her colostomy is patent. She continues to get IV fluids.  8:15 PM I have spoken with Dr. Purnell Shoemaker. Will change her pain meds to PO dilaudid. SHe will f/u with [facility] in the morning. Discussed need for oral fluids.  At this time there does not appear to be any evidence of an acute emergency  medical condition and the patient appears stable for discharge with appropriate outpatient follow up.Diagnosis was discussed with patient who verbalizes understanding and is agreeable to discharge. Pt case discussed with Dr. Purnell Shoemaker who agrees with my plan.    Arthor Captain, PA-C 10/04/12 2018

## 2012-10-05 ENCOUNTER — Telehealth (INDEPENDENT_AMBULATORY_CARE_PROVIDER_SITE_OTHER): Payer: Self-pay | Admitting: General Surgery

## 2012-10-05 ENCOUNTER — Other Ambulatory Visit (INDEPENDENT_AMBULATORY_CARE_PROVIDER_SITE_OTHER): Payer: Self-pay | Admitting: General Surgery

## 2012-10-05 MED ORDER — HYDROMORPHONE HCL 4 MG PO TABS: 4.0000 mg | ORAL_TABLET | ORAL | Status: DC | PRN

## 2012-10-05 NOTE — Telephone Encounter (Signed)
Pt called to say she was seen in ER yesterday for abdominal pain, N/V, CT was done. She was told to stop Percocet and was given RX for hydromorphone. She only has supply for 1-2 more days and is calling to see if she can have a refill or another type of pain medication?/She was instructed to stay on clear liquids for several days and she stated " her pain is a little better and that pain medication is helping to control it. She has a follow-up with Dr. [name] on 10-20-12/Ph-[phone]/gy

## 2012-10-05 NOTE — Telephone Encounter (Signed)
Called to let patient know that we had a RX for dilaudid ready for pickup at front desk...asked patient prior if she could take norco per Dr.Blackman's instructions and she stated that it was not going to help nor be strong enough, so i told her that the dilaudid RX would be ready for pick up .she then stated that her father Cruz Condon or Kizzie Ide would be coming to pick it up for her

## 2012-10-08 NOTE — ED Provider Notes (Signed)
Medical screening examination/treatment/procedure(s) were performed by non-physician practitioner and as supervising physician I was immediately available for consultation/collaboration. Devoria Albe, MD, Armando Gang   Ward Givens, MD 10/08/12 [phone]

## 2012-10-13 ENCOUNTER — Telehealth (INDEPENDENT_AMBULATORY_CARE_PROVIDER_SITE_OTHER): Payer: Self-pay | Admitting: General Surgery

## 2012-10-13 MED ORDER — OXYCODONE-ACETAMINOPHEN 5-325 MG PO TABS: 1.0000 | ORAL_TABLET | ORAL | Status: DC | PRN

## 2012-10-13 NOTE — Telephone Encounter (Signed)
I would ask if percocet would help instead of the dilaudid.  It seem like she had called at least once after her visit with Dr.Wyatt for a refill.  She may need to come in to see Dr. [name] for an evaluation if she is continuing to have that much pain.

## 2012-10-13 NOTE — Telephone Encounter (Signed)
Patient calling for a refill of her dilaudid. Dr [name] off after call. Please advise. She is status post exploratory lap with small bowel obstruction and colostomy 08/11/2012.

## 2012-10-13 NOTE — Telephone Encounter (Signed)
Patient is okay with percocet - okay to write #20 per Dr Derrell Lolling. She does have an appt with Dr [name] next week on 10/20/2012 for follow up. Patient aware RX written.

## 2012-10-13 NOTE — Addendum Note (Signed)
Addended byLiliana Cline on: 10/13/2012 02:37 PM   Modules accepted: Orders

## 2012-10-16 ENCOUNTER — Telehealth (INDEPENDENT_AMBULATORY_CARE_PROVIDER_SITE_OTHER): Payer: Self-pay | Admitting: General Surgery

## 2012-10-16 NOTE — Telephone Encounter (Signed)
eror

## 2012-10-16 NOTE — Telephone Encounter (Signed)
Dr. [name] called back and updated; ordered Percocet 10/ 325 mg, # 30, 1-2 po Q4-6H prn pain, no refill.  Called in meds

## 2012-10-16 NOTE — Telephone Encounter (Signed)
Pt calling to report the pain medicine she picked up on 10/13/12 (Percocet 5/325 mg,  # 20) is not working at all and she needs something stronger.  Paged Dr. [name] and will call her back.

## 2012-10-20 ENCOUNTER — Encounter (INDEPENDENT_AMBULATORY_CARE_PROVIDER_SITE_OTHER): Payer: Medicaid Other | Admitting: General Surgery

## 2012-10-21 ENCOUNTER — Telehealth (INDEPENDENT_AMBULATORY_CARE_PROVIDER_SITE_OTHER): Payer: Self-pay | Admitting: General Surgery

## 2012-10-21 NOTE — Telephone Encounter (Signed)
Pt called in asking for more pain medication.  I explained that after speaking with Dr. Dixon Boos nurse that she missed her appt w/ Dr. [name] yesterday and that she will need an appt to see him first before we can give a refill on her pain Rx.  I made her an appt for 10/27/12 at 8:40.

## 2012-10-27 ENCOUNTER — Telehealth (INDEPENDENT_AMBULATORY_CARE_PROVIDER_SITE_OTHER): Payer: Self-pay | Admitting: General Surgery

## 2012-10-27 ENCOUNTER — Encounter (INDEPENDENT_AMBULATORY_CARE_PROVIDER_SITE_OTHER): Payer: Self-pay | Admitting: General Surgery

## 2012-10-27 ENCOUNTER — Ambulatory Visit (INDEPENDENT_AMBULATORY_CARE_PROVIDER_SITE_OTHER): Payer: Medicaid Other | Admitting: General Surgery

## 2012-10-27 VITALS — BP 122/84 | HR 88 | Temp 97.0°F | Resp 16 | Ht 64.0 in | Wt 113.0 lb

## 2012-10-27 DIAGNOSIS — K651 Peritoneal abscess: Secondary | ICD-10-CM

## 2012-10-27 DIAGNOSIS — IMO0002 Reserved for concepts with insufficient information to code with codable children: Secondary | ICD-10-CM

## 2012-10-27 DIAGNOSIS — Z933 Colostomy status: Secondary | ICD-10-CM

## 2012-10-27 MED ORDER — OXYCODONE-ACETAMINOPHEN 10-325 MG PO TABS
1.0000 | ORAL_TABLET | Freq: Four times a day (QID) | ORAL | Status: DC | PRN
Start: 2012-10-27 — End: 2012-12-21

## 2012-10-27 NOTE — Telephone Encounter (Signed)
Vanessa [name] with [facility] called to confirm the quantity of patient's oxycodone written today. Confirmed it was #40. They will call with any other questions.

## 2012-10-27 NOTE — Progress Notes (Signed)
The patient comes in today feeling fairly well. The amount of output from her left upper abdominal drain is minimal. It started to become a little bit more blood-tinged in the last 24-48 hours.  On examination she is exquisitely tender around the drain. There appears to be a little clear plastic bolster near the entrance site of the percutaneous drain. The exact nature that otherwise there is unknown however it is not part of what was left surgically.  Her colostomy is functioning well. It has normal looking stool in it. I will prescribe her 40 Percocet tablets for pain. I will see her back in one month. I scheduled her for repeat CT scan of the abdomen and pelvis with contrast to assess the abscess cavity and the possibility for drain removal. I will see her back in one month.

## 2012-11-03 ENCOUNTER — Telehealth (INDEPENDENT_AMBULATORY_CARE_PROVIDER_SITE_OTHER): Payer: Self-pay

## 2012-11-03 ENCOUNTER — Telehealth (INDEPENDENT_AMBULATORY_CARE_PROVIDER_SITE_OTHER): Payer: Self-pay | Admitting: *Deleted

## 2012-11-03 ENCOUNTER — Ambulatory Visit (HOSPITAL_COMMUNITY): Payer: Medicaid Other

## 2012-11-03 NOTE — Telephone Encounter (Signed)
Patient calling into office to check the status of her pain medication request.  Per Dr. [name], patient refill request has been denied.  Patient did not have her CT Scan that was scheduled for 11/03/12.  Patient became very upset, "What's this got to do with my pills" I tried to explain to patient that it's important she has the CT scan for further assessment and to see if abdominal fluid collection has resolved.  Patient refused to listen to me and she hung up on me.

## 2012-11-03 NOTE — Telephone Encounter (Signed)
Patient called to request refill on her pain medication.  Informed patient that we would need to check with [name] MD prior to approving refill.

## 2012-11-03 NOTE — Telephone Encounter (Signed)
I returned pt call, No answer. Dr [name] will not refill pain medicine at this time since she did not show up for her scheduled CT. She needs to reschedule it before he will refill anything.

## 2012-11-24 ENCOUNTER — Other Ambulatory Visit (INDEPENDENT_AMBULATORY_CARE_PROVIDER_SITE_OTHER): Payer: Self-pay | Admitting: General Surgery

## 2012-11-24 ENCOUNTER — Encounter (INDEPENDENT_AMBULATORY_CARE_PROVIDER_SITE_OTHER): Payer: Medicaid Other | Admitting: General Surgery

## 2012-11-24 ENCOUNTER — Telehealth (INDEPENDENT_AMBULATORY_CARE_PROVIDER_SITE_OTHER): Payer: Self-pay | Admitting: General Surgery

## 2012-11-24 DIAGNOSIS — IMO0002 Reserved for concepts with insufficient information to code with codable children: Secondary | ICD-10-CM

## 2012-11-24 NOTE — Telephone Encounter (Signed)
Spoke with patient she has appt 11/27/12 @ 8am [facility] for ct abd pelvis with contrast .

## 2012-11-27 ENCOUNTER — Ambulatory Visit (HOSPITAL_COMMUNITY): Payer: Medicaid Other

## 2012-12-03 ENCOUNTER — Ambulatory Visit (HOSPITAL_COMMUNITY)
Admission: RE | Admit: 2012-12-03 | Discharge: 2012-12-03 | Disposition: A | Discharge status: Home / Self Care | Payer: Medicaid Other | Source: Ambulatory Visit | Attending: General Surgery | Admitting: General Surgery

## 2012-12-03 ENCOUNTER — Other Ambulatory Visit (INDEPENDENT_AMBULATORY_CARE_PROVIDER_SITE_OTHER): Payer: Self-pay | Admitting: General Surgery

## 2012-12-03 DIAGNOSIS — N731 Chronic parametritis and pelvic cellulitis: Secondary | ICD-10-CM | POA: Insufficient documentation

## 2012-12-03 MED ORDER — IOHEXOL 350 MG/ML SOLN
50.0000 mL | Freq: Once | INTRAVENOUS | Status: AC | PRN
  Administered 2012-12-03: 50 mL via INTRAVENOUS

## 2012-12-03 MED ORDER — IOHEXOL 300 MG/ML  SOLN
50.0000 mL | Freq: Once | INTRAMUSCULAR | Status: AC | PRN
  Administered 2012-12-03: 20 mL via INTRAVENOUS

## 2012-12-21 ENCOUNTER — Encounter (INDEPENDENT_AMBULATORY_CARE_PROVIDER_SITE_OTHER): Payer: Self-pay | Admitting: General Surgery

## 2012-12-21 ENCOUNTER — Ambulatory Visit (INDEPENDENT_AMBULATORY_CARE_PROVIDER_SITE_OTHER): Payer: Medicaid Other | Admitting: General Surgery

## 2012-12-21 VITALS — BP 124/88 | HR 103 | Temp 97.2°F | Resp 16 | Ht 64.0 in | Wt 128.2 lb

## 2012-12-21 DIAGNOSIS — K632 Fistula of intestine: Secondary | ICD-10-CM

## 2012-12-21 MED ORDER — PROMETHAZINE HCL 25 MG PO TABS: 25.0000 mg | ORAL_TABLET | Freq: Four times a day (QID) | ORAL | Status: DC | PRN

## 2012-12-21 MED ORDER — OXYCODONE-ACETAMINOPHEN 10-325 MG PO TABS: 1.0000 | ORAL_TABLET | Freq: Four times a day (QID) | ORAL | Status: DC | PRN

## 2012-12-21 NOTE — Addendum Note (Signed)
Addended by: Frederik Schmidt on: 12/21/2012 03:51 PM   Modules accepted: Orders

## 2012-12-21 NOTE — Progress Notes (Signed)
The patient comes in today complaining of continued pain around her drain site. She states that she is draining some brownish old fluid approximately 50 cc twice a day from her [place] drain.  In order for the pulp to stay connected to the tubing and had to cut out some of the stretched out tubing which is somewhat stiff. The smell is still very followed. It is draining bloody stool it material mostly coming from her distal Hartman's pouch. Her abdomen is mostly benign. She has some suprapubic tenderness and left lower quadrant tenderness.  Because the output has dwindled down I do not think that she requires any further surgical treatment however need to see the patient back within the next 7-10 days to reassess possibly removing her drain. In the meantime I will re\re prescribe her some oxycodone for her pain and discomfort. I will give her that without Tylenol

## 2013-01-04 ENCOUNTER — Ambulatory Visit (INDEPENDENT_AMBULATORY_CARE_PROVIDER_SITE_OTHER): Payer: Medicaid Other | Admitting: General Surgery

## 2013-01-04 ENCOUNTER — Encounter (INDEPENDENT_AMBULATORY_CARE_PROVIDER_SITE_OTHER): Payer: Self-pay | Admitting: General Surgery

## 2013-01-04 VITALS — BP 120/64 | HR 80 | Temp 99.1°F | Resp 20 | Ht 63.0 in | Wt 131.0 lb

## 2013-01-04 DIAGNOSIS — K632 Fistula of intestine: Secondary | ICD-10-CM

## 2013-01-04 MED ORDER — OXYCODONE-ACETAMINOPHEN 10-325 MG PO TABS: 1.0000 | ORAL_TABLET | Freq: Four times a day (QID) | ORAL | Status: DC | PRN

## 2013-01-04 NOTE — Progress Notes (Signed)
The patient states that the amount of drainage from her percutaneous drain fluctuates with what she eats. She also has noted that when she eats in color will come out of the drain. Her colostomy is functioning fine.  She is complaining of pain at the drain sites and it appears as though the suture that was holding the drain in place is being pulled.with a Betadine prep and local anesthesia and was able to re\re suture the drain in place. All previous sutures were removed.  The patient has frank stool coming out of her drain. Very foul smelling. It has to be connected to the colon proximally. She states that when she drinks or eats certain items that they come out both her rectum and her colostomy and her drain. I am unsure as to how this is happening is that for some type of enteral colocutaneous fistula.  I will re\re prescribe the patient her Percocet for her pain in her drain site. I will also prescribe some Ensure for her continued nutrition. I will see the patient back in 3 weeks. However, this dilemma as to where the patient is fistulized has to be resolved.

## 2013-01-07 ENCOUNTER — Other Ambulatory Visit (HOSPITAL_COMMUNITY): Payer: Medicaid Other

## 2013-01-14 ENCOUNTER — Telehealth (INDEPENDENT_AMBULATORY_CARE_PROVIDER_SITE_OTHER): Payer: Self-pay | Admitting: General Surgery

## 2013-01-14 NOTE — Telephone Encounter (Signed)
Pt called to request refill of her Percocet 5/325 mg.  Paged and updated Dr. [name].  Stated okay to refill now, but no further refills until she has completed the barium study (scheduled on 01/18/13.)  She understands.  Percocet 10/325 mg,  # 30, 1 po Q4-6H prn pain, no refill issued for pick up at the front desk.

## 2013-01-18 ENCOUNTER — Other Ambulatory Visit (HOSPITAL_COMMUNITY): Payer: Medicaid Other

## 2013-01-18 ENCOUNTER — Ambulatory Visit (HOSPITAL_COMMUNITY)
Admission: RE | Admit: 2013-01-18 | Discharge: 2013-01-18 | Disposition: A | Discharge status: Home / Self Care | Payer: Medicaid Other | Source: Ambulatory Visit | Attending: General Surgery | Admitting: General Surgery

## 2013-01-18 DIAGNOSIS — K632 Fistula of intestine: Secondary | ICD-10-CM | POA: Insufficient documentation

## 2013-01-18 DIAGNOSIS — C539 Malignant neoplasm of cervix uteri, unspecified: Secondary | ICD-10-CM | POA: Insufficient documentation

## 2013-01-18 DIAGNOSIS — IMO0002 Reserved for concepts with insufficient information to code with codable children: Secondary | ICD-10-CM | POA: Insufficient documentation

## 2013-01-18 MED ORDER — IOHEXOL 300 MG/ML  SOLN
150.0000 mL | Freq: Once | INTRAMUSCULAR | Status: AC | PRN
  Administered 2013-01-18: 300 mL

## 2013-01-21 ENCOUNTER — Telehealth (INDEPENDENT_AMBULATORY_CARE_PROVIDER_SITE_OTHER): Payer: Self-pay | Admitting: *Deleted

## 2013-01-21 NOTE — Telephone Encounter (Signed)
Patient called to ask for a refill of her pain medication Percocet 10-325mg .  Spoke to [place] MD who approved patient for prescription refill Percocet 10-325mg  1 tablet every 4-6 hours as needed for pain #30 no refills.  Prescription written out and will be at front desk for patient to pick up.  Patient aware and agreeable at this time.

## 2013-01-28 ENCOUNTER — Inpatient Hospital Stay (HOSPITAL_COMMUNITY)
Admission: EM | Admit: 2013-01-28 | Discharge: 2013-01-30 | DRG: 394 | Disposition: A | Discharge status: Home / Self Care | Payer: Medicaid Other | Attending: Internal Medicine | Admitting: Internal Medicine

## 2013-01-28 ENCOUNTER — Inpatient Hospital Stay (HOSPITAL_COMMUNITY): Payer: Medicaid Other

## 2013-01-28 ENCOUNTER — Encounter (HOSPITAL_COMMUNITY): Payer: Self-pay | Admitting: Emergency Medicine

## 2013-01-28 DIAGNOSIS — K632 Fistula of intestine: Principal | ICD-10-CM

## 2013-01-28 DIAGNOSIS — E876 Hypokalemia: Secondary | ICD-10-CM

## 2013-01-28 DIAGNOSIS — Z8541 Personal history of malignant neoplasm of cervix uteri: Secondary | ICD-10-CM

## 2013-01-28 DIAGNOSIS — D638 Anemia in other chronic diseases classified elsewhere: Secondary | ICD-10-CM | POA: Diagnosis present

## 2013-01-28 DIAGNOSIS — E8809 Other disorders of plasma-protein metabolism, not elsewhere classified: Secondary | ICD-10-CM | POA: Diagnosis present

## 2013-01-28 DIAGNOSIS — R109 Unspecified abdominal pain: Secondary | ICD-10-CM

## 2013-01-28 DIAGNOSIS — IMO0002 Reserved for concepts with insufficient information to code with codable children: Secondary | ICD-10-CM | POA: Diagnosis present

## 2013-01-28 DIAGNOSIS — C539 Malignant neoplasm of cervix uteri, unspecified: Secondary | ICD-10-CM

## 2013-01-28 DIAGNOSIS — Z6825 Body mass index (BMI) 25.0-25.9, adult: Secondary | ICD-10-CM

## 2013-01-28 DIAGNOSIS — M79609 Pain in unspecified limb: Secondary | ICD-10-CM

## 2013-01-28 DIAGNOSIS — R9431 Abnormal electrocardiogram [ECG] [EKG]: Secondary | ICD-10-CM

## 2013-01-28 DIAGNOSIS — R197 Diarrhea, unspecified: Secondary | ICD-10-CM | POA: Diagnosis present

## 2013-01-28 DIAGNOSIS — E860 Dehydration: Secondary | ICD-10-CM | POA: Diagnosis present

## 2013-01-28 DIAGNOSIS — F1210 Cannabis abuse, uncomplicated: Secondary | ICD-10-CM | POA: Diagnosis present

## 2013-01-28 DIAGNOSIS — E46 Unspecified protein-calorie malnutrition: Secondary | ICD-10-CM

## 2013-01-28 DIAGNOSIS — Z87891 Personal history of nicotine dependence: Secondary | ICD-10-CM

## 2013-01-28 DIAGNOSIS — R112 Nausea with vomiting, unspecified: Secondary | ICD-10-CM

## 2013-01-28 DIAGNOSIS — E440 Moderate protein-calorie malnutrition: Secondary | ICD-10-CM | POA: Diagnosis present

## 2013-01-28 DIAGNOSIS — Z933 Colostomy status: Secondary | ICD-10-CM

## 2013-01-28 DIAGNOSIS — R609 Edema, unspecified: Secondary | ICD-10-CM

## 2013-01-28 LAB — CBC WITH DIFFERENTIAL/PLATELET
Basophils Absolute: 0.1 10*3/uL (ref 0.0–0.1)
Basophils Relative: 1 % (ref 0–1)
HCT: 27.2 % — ABNORMAL LOW (ref 36.0–46.0)
Hemoglobin: 9.4 g/dL — ABNORMAL LOW (ref 12.0–15.0)
Lymphocytes Relative: 23 % (ref 12–46)
Lymphs Abs: 1.8 10*3/uL (ref 0.7–4.0)
MCV: 87.5 fL (ref 78.0–100.0)
Monocytes Relative: 11 % (ref 3–12)
Neutro Abs: 4.9 10*3/uL (ref 1.7–7.7)
RDW: 14.8 % (ref 11.5–15.5)
WBC: 7.9 10*3/uL (ref 4.0–10.5)

## 2013-01-28 LAB — URINALYSIS, ROUTINE W REFLEX MICROSCOPIC
Bilirubin Urine: NEGATIVE
Ketones, ur: NEGATIVE mg/dL
Leukocytes, UA: NEGATIVE
Nitrite: NEGATIVE
Protein, ur: 30 mg/dL — AB
Urobilinogen, UA: 0.2 mg/dL (ref 0.0–1.0)
pH: 7 (ref 5.0–8.0)

## 2013-01-28 LAB — CLOSTRIDIUM DIFFICILE BY PCR: Toxigenic C. Difficile by PCR: NEGATIVE

## 2013-01-28 LAB — CBC
HCT: 27 % — ABNORMAL LOW (ref 36.0–46.0)
MCH: 30 pg (ref 26.0–34.0)
MCHC: 34.4 g/dL (ref 30.0–36.0)
RDW: 14.5 % (ref 11.5–15.5)

## 2013-01-28 LAB — IRON AND TIBC
Saturation Ratios: 5 % — ABNORMAL LOW (ref 20–55)
TIBC: 312 ug/dL (ref 250–470)
UIBC: 295 ug/dL (ref 125–400)

## 2013-01-28 LAB — CREATININE, SERUM
Creatinine, Ser: 0.51 mg/dL (ref 0.50–1.10)
GFR calc non Af Amer: >90 mL/min (ref >90)

## 2013-01-28 LAB — LIPASE, BLOOD: Lipase: 12 U/L (ref 11–59)

## 2013-01-28 LAB — URINE MICROSCOPIC-ADD ON

## 2013-01-28 MED ORDER — POTASSIUM CHLORIDE 10 MEQ/100ML IV SOLN
10.0000 meq | INTRAVENOUS | Status: AC
  Administered 2013-01-28 (×3): 10 meq via INTRAVENOUS
  Filled 2013-01-28 (×3): qty 100

## 2013-01-28 MED ORDER — DIPHENHYDRAMINE HCL 50 MG/ML IJ SOLN
12.5000 mg | Freq: Once | INTRAMUSCULAR | Status: AC
  Administered 2013-01-28: 12.5 mg via INTRAVENOUS
  Filled 2013-01-28: qty 1

## 2013-01-28 MED ORDER — HYDROMORPHONE HCL PF 1 MG/ML IJ SOLN
1.0000 mg | Freq: Once | INTRAMUSCULAR | Status: AC
  Administered 2013-01-28: 1 mg via INTRAVENOUS
  Filled 2013-01-28: qty 1

## 2013-01-28 MED ORDER — ACETAMINOPHEN 325 MG PO TABS: 650.0000 mg | ORAL_TABLET | Freq: Four times a day (QID) | ORAL | Status: DC | PRN

## 2013-01-28 MED ORDER — ONDANSETRON HCL 4 MG PO TABS: 4.0000 mg | ORAL_TABLET | Freq: Four times a day (QID) | ORAL | Status: DC | PRN

## 2013-01-28 MED ORDER — IOHEXOL 300 MG/ML  SOLN
25.0000 mL | INTRAMUSCULAR | Status: AC
  Administered 2013-01-28 (×2): 25 mL via ORAL

## 2013-01-28 MED ORDER — SODIUM CHLORIDE 0.9 % IV BOLUS (SEPSIS)
1000.0000 mL | Freq: Once | INTRAVENOUS | Status: AC
  Administered 2013-01-28: 1000 mL via INTRAVENOUS

## 2013-01-28 MED ORDER — ENOXAPARIN SODIUM 40 MG/0.4ML ~~LOC~~ SOLN: 40.0000 mg | SUBCUTANEOUS | Status: DC

## 2013-01-28 MED ORDER — ACETAMINOPHEN 650 MG RE SUPP: 650.0000 mg | Freq: Four times a day (QID) | RECTAL | Status: DC | PRN

## 2013-01-28 MED ORDER — POTASSIUM CHLORIDE CRYS ER 20 MEQ PO TBCR
40.0000 meq | EXTENDED_RELEASE_TABLET | Freq: Once | ORAL | Status: AC
  Administered 2013-01-28: 40 meq via ORAL
  Filled 2013-01-28: qty 2

## 2013-01-28 MED ORDER — MAGNESIUM SULFATE 40 MG/ML IJ SOLN
2.0000 g | Freq: Once | INTRAMUSCULAR | Status: AC
  Administered 2013-01-28: 2 g via INTRAVENOUS
  Filled 2013-01-28: qty 50

## 2013-01-28 MED ORDER — ENOXAPARIN SODIUM 40 MG/0.4ML ~~LOC~~ SOLN
40.0000 mg | Freq: Every day | SUBCUTANEOUS | Status: DC
  Administered 2013-01-28 – 2013-01-30 (×3): 40 mg via SUBCUTANEOUS
  Filled 2013-01-28 (×4): qty 0.4

## 2013-01-28 MED ORDER — IOHEXOL 300 MG/ML  SOLN
100.0000 mL | Freq: Once | INTRAMUSCULAR | Status: AC | PRN
  Administered 2013-01-28: 80 mL via INTRAVENOUS

## 2013-01-28 MED ORDER — ONDANSETRON HCL 4 MG/2ML IJ SOLN
4.0000 mg | Freq: Once | INTRAMUSCULAR | Status: AC
  Administered 2013-01-28: 4 mg via INTRAVENOUS
  Filled 2013-01-28: qty 2

## 2013-01-28 MED ORDER — HYDROMORPHONE HCL PF 1 MG/ML IJ SOLN
1.0000 mg | INTRAMUSCULAR | Status: DC | PRN
  Administered 2013-01-28 – 2013-01-30 (×10): 1 mg via INTRAVENOUS
  Filled 2013-01-28 (×10): qty 1

## 2013-01-28 MED ORDER — POTASSIUM CHLORIDE 10 MEQ/100ML IV SOLN
10.0000 meq | Freq: Once | INTRAVENOUS | Status: AC
  Administered 2013-01-28: 10 meq via INTRAVENOUS
  Filled 2013-01-28: qty 100

## 2013-01-28 MED ORDER — ONDANSETRON HCL 4 MG/2ML IJ SOLN
4.0000 mg | Freq: Four times a day (QID) | INTRAMUSCULAR | Status: DC | PRN
  Administered 2013-01-28 – 2013-01-30 (×4): 4 mg via INTRAVENOUS
  Filled 2013-01-28 (×4): qty 2

## 2013-01-28 NOTE — Progress Notes (Signed)
Patient ID: Vanessa [name], female   DOB: 1975/10/19, 37 y.o.   MRN: [id number]    Subjective: This is a patient well-known to our service and Dr. [name] for a complex surgical history.  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  She is being followed by Dr. [name] as an outpatient for this.  She has been having some diarrhea out of her JP drain and her rectum.  She presented to  [facility] due to abdominal pain, swelling of her legs and abdomen, and dehydration.  She was found to have hypokalemia with some EKG changes and was admitted.  She had a CT scan which revealed no significant changes to pre-existing conditions in the pelvis and this fistula.  I discussed her findings with Dr. [name] as well, who is supposed to see her tomorrow in the office.  We have been asked to see her while she is here in the hospital.  Objective: Vital signs in last 24 hours: Temp:  [97.7 F (36.5 C)-98.5 F (36.9 C)] 97.7 F (36.5 C) (05/08 0653) Pulse Rate:  [81-100] 81 (05/08 0653) Resp:  [18-20] 20 (05/08 0653) BP: (150-162)/(76-122) 158/118 mmHg (05/08 0653) SpO2:  [94 %-100 %] 94 % (05/08 0653) Weight:  [148 lb (67.132 kg)] 148 lb (67.132 kg) (05/08 0653) Last BM Date: 01/27/13  Intake/Output from previous day: 05/07 0701 - 05/08 0700 In: 1000 [I.V.:1000] Out: -  Intake/Output this shift: Total I/O In: 100 [IV Piggyback:100] Out: 270 [Urine:150; Other:70; Stool:50]  PE: Abd: soft, minimally tender, +BS, JP drain with thin stoolish material, ostomy with viable stoma and appropriate output.     Lab Results:   Recent Labs  01/28/13 0051 01/28/13 0900  WBC 7.9 7.0  HGB 9.4* 9.3*  HCT 27.2* 27.0*  PLT 249 370   BMET  Recent Labs  01/28/13 0051 01/28/13 0900  NA 138  --   K 2.5*  --   CL 103  --   CO2 24  --   GLUCOSE 89  --   BUN 5*  --   CREATININE 0.50 0.51  CALCIUM 9.4  --    PT/INR No results found for this basename: LABPROT, INR,  in the last  72 hours CMP     Component Value Date/Time   NA 138 01/28/2013 0051   K 2.5* 01/28/2013 0051   CL 103 01/28/2013 0051   CO2 24 01/28/2013 0051   GLUCOSE 89 01/28/2013 0051   BUN 5* 01/28/2013 0051   CREATININE 0.51 01/28/2013 0900   CALCIUM 9.4 01/28/2013 0051   CALCIUM 10.7* 02/01/2012 0705   PROT 6.2 01/28/2013 0051   ALBUMIN 2.7* 01/28/2013 0051   AST 15 01/28/2013 0051   ALT 15 01/28/2013 0051   ALKPHOS 105 01/28/2013 0051   BILITOT 0.3 01/28/2013 0051   GFRNONAA >90 01/28/2013 0900   GFRAA >90 01/28/2013 0900   Lipase     Component Value Date/Time   LIPASE 12 01/28/2013 0051       Studies/Results: Ct Abdomen Pelvis W Contrast  01/28/2013  *RADIOLOGY REPORT*  Clinical Data: Left lower quadrant abdominal pain with lower extremity and abdominal edema.  Left lower quadrant drain placed 7 months ago.  History of cervical cancer, radiation and chemotherapy, sigmoid colectomy with ostomy.  CT ABDOMEN AND PELVIS WITH CONTRAST  Technique:  Multidetector CT imaging of the abdomen and pelvis was performed following the standard protocol during bolus administration of intravenous contrast.  Contrast: 80mL OMNIPAQUE  IOHEXOL 300 MG/ML  SOLN  Comparison: Abdominal pelvic CT 12/03/2012.  Barium enema 01/18/2013.  Findings: Images through the lung bases demonstrate stable cardiomegaly and linear scarring or atelectasis at both lung bases. There are trace bilateral pleural effusions.  There is mild ascites with diffuse soft tissue edema consistent with anasarca.  The abdomen is imaged prior to portal vein opacification.  There is some retrograde filling of the IVC and hepatic veins.  The hepatic steatosis has improved compared with the most recent study.  The previously noted low density lesions in the right and left hepatic lobes are grossly stable. The gallbladder is surgically absent. There is a stable nonobstructing calculus in the lower pole of the left kidney.  There is no hydronephrosis.  The right kidney, spleen, pancreas  and adrenal glands appear normal.  Descending colostomy and left lower quadrant percutaneous drain appear unchanged.  Evaluation of the pelvic anatomy is limited by the anasarca and arterial phase imaging.  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 There is likely a small amount of residual extraluminal air and fluid in the left lower quadrant inferior and anterior to the surgical drain.  This appears grossly unchanged.  There is diffuse bladder wall thickening.  There is diffuse thickening of the wall of the rectosigmoid colon and perirectal soft tissue stranding. There is still some air within the vagina, but no contrast.  There are no worrisome osseous findings.  IMPRESSION:  1.  Progressive anasarca with diffuse soft tissue edema, ascites and trace pleural effusions. 2.  Cardiomegaly with reflux of contrasted blood into the IVC and hepatic veins suggesting a degree of right heart failure. 3.  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  No large drainable collection identified.   Original Report Authenticated By: Carey Bullocks, M.D.     Anti-infectives: Anti-infectives   None       Assessment/Plan  1. enterocolonic fistula 2. S/p complex surgical history 3. Hypokalemia  Plan: 1. There is nothing surgical to do right now as an inpatient.  She has an appointment tomorrow with Dr. [name] to discuss possible future surgery to correct this fistula.  I have d/w Dr. Gwenlyn Perking as well. I spoke to her about drinking gatorade or pedialyte to supplement her electrolytes.  Hopefully, she will be able to be dc and make it to her appointment tomorrow afternoon.   LOS: 0 days    Jerrell Mangel  E 01/28/2013, 4:23 PM Pager: [phone]

## 2013-01-28 NOTE — ED Provider Notes (Signed)
Medical screening examination/treatment/procedure(s) were conducted as a shared visit with non-physician practitioner(s) and myself.  I personally evaluated the patient during the encounter   Joya Gaskins, MD 01/28/13 [phone]

## 2013-01-28 NOTE — H&P (Addendum)
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 In November 2013, surgery attempted to reanastomose the patient's ileostomy, but this was unsuccessful due to the patient's dense adhesions. As a result, a colostomy was created. The patient also developed an intrapelvic abscess for which the left lower quadrant percutaneous drain has been placed. The drain has been in place since November 2013. The patient states that she has been taking up to 6 Percocets per day or as on a good day, the patient normally takes only 2-3 Percocets. In addition, the patient states that she has been nauseous as a result only been eating ice, up to 10 cups per day. She denies any fevers, chills, chest discomfort, shortness of breath, vomiting, dysuria, hematuria.  She does note that she has had increase stool from her rectum. She still makes stool from her ostomy. She denies any hematochezia or melena. The patient had a barium enema on 01/18/2013 which revealed a fistula between the sigmoid colon and distal small bowel. In addition, the patient also relates a history of increasing drainage from her left lower quadrant pelvic drain with feculent type material.  In the emergency department, BMP revealed hypokalemia with potassium 2.5. CBC showed anemia with a hemoglobin of 9.4, WBC 7.9. Hepatic enzymes were negative. EKG shows sinus rhythm but showed T-wave inversion in V5, V6, III, avF and prolonged QT. These were new findings when compared to previous EKG in November 2013.  Urinalysis was unremarkable. Because of EKG abnormalities, hypokalemia, abdominal pain admission was requested Assessment/Plan:  abdominal pain with a history of radiation proctitis with coloenteral fistula as noted on 01/10/2013 barium enema -Concerned about development of abscess  -CT abdomen/pelvis  -Consult surgery in the morning  -Currently abdomen is nonacute  -Check stool for C. difficile PCR as patient relates increasing loose stool  -Will not start the patient on antibiotics at this time as she is afebrile, hemodynamically stable, has normal WBC.   hypokalemia -Replete -Check magnesium -pt received po and IV in ED -will give additional IV Abnormal EKG -May be due to the patient's electrolyte abnormalities -Repeat EKG after correction of electrolyte abnormalities -Cycle troponins -admit to telemetry for now -tele can be d/ced if abnormalities resolve and/or if w/u is neg Anemia -Hemoglobin remained stable at the patient's baseline -Baseline hemoglobin 9-10 -Check RBC folate, B12, iron studies Lower extremity edema/lower abdominal edema -Suspect a degree of hypoalbuminemia (2.7) -Venous Doppler lower extremities rule out DVT        Past Medical History  Diagnosis Date  . History of blood transfusion     "I had ~ 7 in 01/2012"  . Anemia   . Cervical cancer ~ 2011    cervical  . Radiation Nov.18,2011-Jan.23,2012    cervix  . Hypertension     no treatment necessary for BP  since July 2013   Past Surgical History  Procedure Laterality Date  . Radiation implants  ~ 2011    "for cervical cancer"  . Colonoscopy  01/30/2012    Procedure: COLONOSCOPY;  Surgeon: Beverley Fiedler, MD;  Location[facility]  ENDOSCOPY;  Service: Gastroenterology;  Laterality: N/A;  . Colonoscopy  01/30/2012    Procedure: COLONOSCOPY;  Surgeon: Beverley Fiedler, MD;  Location: [facility] OR;  Service: Gastroenterology;  Laterality: N/A;  . Colostomy revision  02/04/2012    Procedure: COLON  RESECTION SIGMOID;  Surgeon: Cherylynn Ridges, MD;  Location: [facility] OR;  Service: General;  Laterality: N/A;  . Cholecystectomy  2006  . Laparotomy  08/11/2012    Procedure: EXPLORATORY LAPAROTOMY;  Surgeon: Cherylynn Ridges, MD;  Location: [facility] OR;  Service: General;  Laterality: N/A;  . Laparoscopic lysis of adhesions  08/11/2012    Procedure: LAPAROSCOPIC LYSIS OF ADHESIONS;  Surgeon: Cherylynn Ridges, MD;  Location: [facility] OR;  Service: General;  Laterality: N/A;  . Bowel resection  08/11/2012    Procedure: SMALL BOWEL RESECTION;  Surgeon: Cherylynn Ridges, MD;  Location: [facility] OR;  Service: General;  Laterality: N/A;  . Colostomy  08/11/2012    Procedure: COLOSTOMY;  Surgeon: Cherylynn Ridges, MD;  Location: [facility] OR;  Service: General;  Laterality: N/A;   Social History:  reports that she quit smoking about a year ago. Her smoking use included Cigarettes. She has a .25 pack-year smoking history. She has never used smokeless tobacco. She reports that  drinks alcohol. She reports that she uses illicit drugs (Marijuana).   Family Hx reviewed:  No pertinent family hx  Allergies  Allergen Reactions  . Penicillins Other (See Comments)    "anything w/penicillin in it makes me bleed"  . Labetalol Hcl Itching and Other (See Comments)    Bumps to bilateral arms.  . Morphine And Related Hives      Prior to Admission medications   Medication Sig Start Date End Date Taking? Authorizing Provider  oxyCODONE-acetaminophen (PERCOCET) 10-325 MG per tablet Take 1 tablet by mouth every 6 (six) hours as needed for pain. 01/04/13 01/04/14 Yes Cherylynn Ridges, MD    Review of Systems:  Constitutional:  No weight loss, night sweats, Fevers, chills, fatigue.  Head&Eyes: No headache.  No vision loss.  No eye pain or scotoma ENT:  No Difficulty swallowing,Tooth/dental problems,Sore throat,    Cardio-vascular:  No chest pain, Orthopnea, PND,dizziness, palpitations  GI:  No  vomiting, diarrhea, loss of appetite, hematochezia,  melena, heartburn, indigestion, Resp:  No shortness of breath with exertion or at rest. No cough. No coughing up of blood .No wheezing.No chest wall deformity  Skin:  no rash or lesions.  GU:  no dysuria, change in color of urine, no urgency or frequency. No flank pain.  Musculoskeletal:  No joint pain or swelling. No decreased range of motion. No back pain.  Psych:  No change in mood or affect. No depression or anxiety. Neurologic: No headache, no dysesthesia, no focal weakness, no vision loss. No syncope  Physical Exam: Filed Vitals:   01/28/13 0028  BP: 162/122  Temp: 98.5 F (36.9 C)  TempSrc: Oral  Resp: 18  SpO2: 100%   General:  A&O x 3, NAD, nontoxic, pleasant/cooperative Head/Eye: No conjunctival hemorrhage, no icterus, Belt/AT, No nystagmus ENT:  No icterus,  No thrush,  no pharyngeal exudate Neck:  No masses, no lymphadenpathy, no bruits CV:  RRR, no rub, no gallop, no S3 Lung:  CTAB, good air movement, no wheeze, no rhonchi Abdomen: soft/tender to palpation left lower quadrant without peritoneal signs or guarding., +BS, nondistended, no peritoneal signs; left lower quadrant drain with feculent material. Left lower quadrant ostomy noted stool. Ext: No cyanosis, No rashes,  No petechiae, No lymphangitis, trace edema   Labs on Admission:  Basic Metabolic Panel:  Recent Labs Lab 01/28/13 0051  NA 138  K 2.5*  CL 103  CO2 24  GLUCOSE 89  BUN 5*  CREATININE 0.50  CALCIUM 9.4   Liver Function Tests:  Recent Labs Lab 01/28/13 0051  AST 15  ALT 15  ALKPHOS 105  BILITOT 0.3  PROT 6.2  ALBUMIN 2.7*    Recent Labs Lab 01/28/13 0051  LIPASE 12   No results found for this basename: AMMONIA,  in the last 168 hours CBC:  Recent Labs Lab 01/28/13 0051  WBC 7.9  NEUTROABS 4.9  HGB 9.4*  HCT 27.2*  MCV 87.5  PLT 249   Cardiac Enzymes: No results found for this basename: CKTOTAL, CKMB, CKMBINDEX, TROPONINI,  in the last 168 hours BNP: No  components found with this basename: POCBNP,  CBG: No results found for this basename: GLUCAP,  in the last 168 hours  Radiological Exams on Admission: No results found.  EKG: Independently reviewed. Sinus rhythm, prolonged QTC, T wave inversion V5, V6, III, aVF     Time spent:70 minutes Code Status:   FULL Family Communication:   Family at bedside   Mckenze Slone, DO  [facility] Pager [phone]  If 7PM-7AM, please contact night-coverage [email or web address] Password [facility] 01/28/2013, 4:41 AM

## 2013-01-28 NOTE — Progress Notes (Signed)
VASCULAR LAB PRELIMINARY  PRELIMINARY  PRELIMINARY  PRELIMINARY  Bilateral lower extremity venous Dopplers completed.    Preliminary report:  There is no DVT or SVT noted in the bilateral extremities.  Khizar Fiorella, RVT 01/28/2013, 2:47 PM

## 2013-01-28 NOTE — ED Notes (Signed)
Patient with nausea and right upper and lower quadrant pain.  Patient is CAOx3, with colostomy.  Patient has had pain since her surgery last May.  Patient states she has been nauseated for a few days.

## 2013-01-28 NOTE — ED Provider Notes (Signed)
Patient seen/examined in the Emergency Department in conjunction with Midlevel Provider [place] Patient reports abdominal pain, but reports it is improving on my evaluation Exam : awake/alert.  Colostomy in place and drainage tube in place.  Her abdomen is soft on my exam Plan: hypokalemia noted.  Will need replacement and will also check EKG   Joya Gaskins, MD 01/28/13 [phone]

## 2013-01-28 NOTE — ED Provider Notes (Signed)
History     CSN: [id number]  Arrival date & time 01/28/13  0022   First MD Initiated Contact with Patient 01/28/13 [phone]      Chief Complaint  Patient presents with  . Leg Swelling  . Abdominal Pain   HPI  History provided by the patient. Patient is a 37 year old female with past history of cervical cancer bowel resection with left lower ostomy, feeding tube placement and hypertension who presents with complaints of abdominal pain and swelling around her abdomen genitals and legs. Patient has noticed swelling to lower extremities since Tuesday. This is added some waxing and waning with some recent improvements especially around the lower legs and feet. She continues to feel some swollen areas around the thighs groin area and lower abdomen. She also complains of abdominal pains. She has been taking her oxycodone 10 mg at home without improvement of her pains. She does have history of chronic pains to the abdomen but states this feels worse and changed. Denies any other associated symptoms. Denies any fever, chills or sweats. Denies any urinary changes, dysuria, hematuria or urinary frequency. Denies any vaginal bleeding or vaginal discharge. Denies any erythema of the skin.    Past Medical History  Diagnosis Date  . History of blood transfusion     "I had ~ 7 in 01/2012"  . Anemia   . Cervical cancer ~ 2011    cervical  . Radiation Nov.18,2011-Jan.23,2012    cervix  . Hypertension     no treatment necessary for BP  since July 2013    Past Surgical History  Procedure Laterality Date  . Radiation implants  ~ 2011    "for cervical cancer"  . Colonoscopy  01/30/2012    Procedure: COLONOSCOPY;  Surgeon: Beverley Fiedler, MD;  Location: [facility] ENDOSCOPY;  Service: Gastroenterology;  Laterality: N/A;  . Colonoscopy  01/30/2012    Procedure: COLONOSCOPY;  Surgeon: Beverley Fiedler, MD;  Location: [facility] OR;  Service: Gastroenterology;  Laterality: N/A;  . Colostomy revision  02/04/2012    Procedure: COLON  RESECTION SIGMOID;  Surgeon: Cherylynn Ridges, MD;  Location: [facility] OR;  Service: General;  Laterality: N/A;  . Cholecystectomy  2006  . Laparotomy  08/11/2012    Procedure: EXPLORATORY LAPAROTOMY;  Surgeon: Cherylynn Ridges, MD;  Location: [facility] OR;  Service: General;  Laterality: N/A;  . Laparoscopic lysis of adhesions  08/11/2012    Procedure: LAPAROSCOPIC LYSIS OF ADHESIONS;  Surgeon: Cherylynn Ridges, MD;  Location: [facility] OR;  Service: General;  Laterality: N/A;  . Bowel resection  08/11/2012    Procedure: SMALL BOWEL RESECTION;  Surgeon: Cherylynn Ridges, MD;  Location: [facility] OR;  Service: General;  Laterality: N/A;  . Colostomy  08/11/2012    Procedure: COLOSTOMY;  Surgeon: Cherylynn Ridges, MD;  Location: [facility] OR;  Service: General;  Laterality: N/A;    No family history on file.  History  Substance Use Topics  . Smoking status: Former Smoker -- 0.50 packs/day for .5 years    Types: Cigarettes    Quit date: 01/26/2012  . Smokeless tobacco: Never Used  . Alcohol Use: 0.0 oz/week     Comment: 03/16/12 "haven't drank in ~ 2-3 years"    OB History   Grav Para Term Preterm Abortions TAB SAB Ect Mult Living   10 8              Review of Systems  Constitutional: Negative for fever, chills and diaphoresis.  Respiratory: Negative for  shortness of breath.   Cardiovascular: Positive for leg swelling. Negative for chest pain and palpitations.  Gastrointestinal: Positive for nausea and abdominal pain. Negative for vomiting, diarrhea and constipation.  Genitourinary: Negative for dysuria, frequency, hematuria, flank pain, vaginal bleeding and vaginal discharge.  Skin: Negative for rash.  All other systems reviewed and are negative.    Allergies  Penicillins; Labetalol hcl; and Morphine and related  Home Medications   Current Outpatient Rx  Name  Route  Sig  Dispense  Refill  . oxyCODONE-acetaminophen (PERCOCET) 10-325 MG per tablet   Oral   Take 1 tablet by mouth every 6 (six) hours as needed for  pain.   40 tablet   0     BP 162/122  Pulse   Temp(Src) 98.5 F (36.9 C) (Oral)  Resp 18  SpO2 100%  Physical Exam  Nursing note and vitals reviewed. Constitutional: She is oriented to person, place, and time. She appears well-developed and well-nourished. No distress.  HENT:  Head: Normocephalic.  Cardiovascular: Normal rate and regular rhythm.   Pulmonary/Chest: Effort normal and breath sounds normal. No respiratory distress. She has no wheezes. She has no rales.  Abdominal: Soft. She exhibits no distension. There is tenderness. There is no rebound and no guarding.  Left lower abdomen ostomy in place clean dry and intact. Feeding tube also in place in the left upper quadrant of abdomen. Patient is obese. It is difficult to appreciate swelling of the tissue she is complaining about. There is tenderness diffusely in the abdomen greatest around the feeding tube site. Lower midline abdominal surgical scar consistent with history of prior surgeries.  Musculoskeletal: Normal range of motion. She exhibits tenderness. She exhibits no edema.  No appreciable swelling to bilateral thigh areas. No swelling to the lower feet and legs. Normal dorsal pedal pulses bilaterally. Normal sensations. Strength equal in all extremities.  Neurological: She is alert and oriented to person, place, and time.  Skin: Skin is warm and dry. No rash noted.  Psychiatric: She has a normal mood and affect. Her behavior is normal.    ED Course  Procedures  Results for orders placed during the hospital encounter of 01/28/13  CBC WITH DIFFERENTIAL      Result Value Range   WBC 7.9  4.0 - 10.5 K/uL   RBC 3.11 (*) 3.87 - 5.11 MIL/uL   Hemoglobin 9.4 (*) 12.0 - 15.0 g/dL   HCT 14.7 (*) 82.9 - 56.2 %   MCV 87.5  78.0 - 100.0 fL   MCH 30.2  26.0 - 34.0 pg   MCHC 34.6  30.0 - 36.0 g/dL   RDW 13.0  86.5 - 78.4 %   Platelets 249  150 - 400 K/uL   Neutrophils Relative 63  43 - 77 %   Lymphocytes Relative 23  12 - 46  %   Monocytes Relative 11  3 - 12 %   Eosinophils Relative 2  0 - 5 %   Basophils Relative 1  0 - 1 %   Neutro Abs 4.9  1.7 - 7.7 K/uL   Lymphs Abs 1.8  0.7 - 4.0 K/uL   Monocytes Absolute 0.9  0.1 - 1.0 K/uL   Eosinophils Absolute 0.2  0.0 - 0.7 K/uL   Basophils Absolute 0.1  0.0 - 0.1 K/uL   Smear Review MORPHOLOGY UNREMARKABLE    COMPREHENSIVE METABOLIC PANEL      Result Value Range   Sodium 138  135 - 145 mEq/L   Potassium  2.5 (*) 3.5 - 5.1 mEq/L   Chloride 103  96 - 112 mEq/L   CO2 24  19 - 32 mEq/L   Glucose, Bld 89  70 - 99 mg/dL   BUN 5 (*) 6 - 23 mg/dL   Creatinine, Ser 7.82  0.50 - 1.10 mg/dL   Calcium 9.4  8.4 - 95.6 mg/dL   Total Protein 6.2  6.0 - 8.3 g/dL   Albumin 2.7 (*) 3.5 - 5.2 g/dL   AST 15  0 - 37 U/L   ALT 15  0 - 35 U/L   Alkaline Phosphatase 105  39 - 117 U/L   Total Bilirubin 0.3  0.3 - 1.2 mg/dL   GFR calc non Af Amer >90  >90 mL/min   GFR calc Af Amer >90  >90 mL/min  LIPASE, BLOOD      Result Value Range   Lipase 12  11 - 59 U/L  URINALYSIS, ROUTINE W REFLEX MICROSCOPIC      Result Value Range   Color, Urine YELLOW  YELLOW   APPearance CLOUDY (*) CLEAR   Specific Gravity, Urine 1.008  1.005 - 1.030   pH 7.0  5.0 - 8.0   Glucose, UA NEGATIVE  NEGATIVE mg/dL   Hgb urine dipstick MODERATE (*) NEGATIVE   Bilirubin Urine NEGATIVE  NEGATIVE   Ketones, ur NEGATIVE  NEGATIVE mg/dL   Protein, ur 30 (*) NEGATIVE mg/dL   Urobilinogen, UA 0.2  0.0 - 1.0 mg/dL   Nitrite NEGATIVE  NEGATIVE   Leukocytes, UA NEGATIVE  NEGATIVE  URINE MICROSCOPIC-ADD ON      Result Value Range   Squamous Epithelial / LPF RARE  RARE   WBC, UA 0-2  <3 WBC/hpf   RBC / HPF 3-6  <3 RBC/hpf   Bacteria, UA RARE  RARE  POCT PREGNANCY, URINE      Result Value Range   Preg Test, Ur NEGATIVE  NEGATIVE        1. Hypokalemia   2. Abdominal pain       MDM  12:35AM patient seen and evaluated. Patient resting in the bed and appears comfortable in no acute  distress.  Patient reports feeling improved after pain medications. She is resting comfortably. Labs pending.  Patient has hypokalemia. Potassium ordered. Patient seen and discussed with attending physician. He is also ordered magnesium and EKG.  Spoke with [facility]. They will see patient and admit.     Date: 01/28/2013  Rate: 81  Rhythm: normal sinus rhythm  QRS Axis: normal  Intervals: QT prolonged  ST/T Wave abnormalities: nonspecific T wave changes  Conduction Disutrbances:none  Narrative Interpretation: Questionable U waves, left atrial abnormality, LVH, late RS transition   Old EKG Reviewed: none available             Angus Seller, PA-C 01/28/13 [phone]

## 2013-01-28 NOTE — ED Provider Notes (Signed)
Date: 01/28/2013  Rate: 81  Rhythm: normal sinus rhythm  QRS Axis: right  Intervals: QT prolonged  ST/T Wave abnormalities: nonspecific ST changes  Conduction Disutrbances:none  Narrative Interpretation: ? u waves noted  Old EKG Reviewed: changes noted    Joya Gaskins, MD 01/28/13 [phone]

## 2013-01-28 NOTE — Progress Notes (Signed)
Discussed with Dr. [name].  Wilmon Arms. Corliss Skains, MD, [facility] Surgery  General/ Trauma Surgery  01/28/2013 4:47 PM

## 2013-01-28 NOTE — ED Notes (Signed)
C/o swelling to bilateral legs, vaginal area, R breast, and bilateral sides since Tuesday.  Also c/o chronic abd pain.  Taking Percocet for pain.

## 2013-01-28 NOTE — Progress Notes (Signed)
Patient seen and examined. Admitted after midnight secondary to abdominal pain, diarrhea and nausea/vomiting. Patient found dehydrated, hypokalemic and with EKG abnormalities. Please referred to H&P by Dr. Janalyn Harder for further details and info on admission.  Plan: -replete electrolytes -cycle cardiac enzymes -prn analgesics and antiemetics -consult CCS and follow rec's -patient denies SOB or CP. -CT abdomen w/o acute intracranial process to explain symptoms. Specifically no abscess.  Kincaid Tiger [phone]

## 2013-01-28 NOTE — Progress Notes (Signed)
01/28/13 Patent was admitted from Emergency room to Rm [id number], Tele- NSR, Placed on C-diff precautions until PCR was sent to Lab which was negative.

## 2013-01-28 NOTE — Progress Notes (Signed)
Pt complaining of itching. On call NP notified. New order for benadryl given. Will continue to monitor.

## 2013-01-29 ENCOUNTER — Encounter (INDEPENDENT_AMBULATORY_CARE_PROVIDER_SITE_OTHER): Payer: Medicaid Other | Admitting: General Surgery

## 2013-01-29 DIAGNOSIS — E8809 Other disorders of plasma-protein metabolism, not elsewhere classified: Secondary | ICD-10-CM

## 2013-01-29 DIAGNOSIS — E46 Unspecified protein-calorie malnutrition: Secondary | ICD-10-CM

## 2013-01-29 DIAGNOSIS — E876 Hypokalemia: Secondary | ICD-10-CM

## 2013-01-29 LAB — COMPREHENSIVE METABOLIC PANEL
Alkaline Phosphatase: 105 U/L (ref 39–117)
BUN: 5 mg/dL — ABNORMAL LOW (ref 6–23)
BUN: 7 mg/dL (ref 6–23)
CO2: 24 mEq/L (ref 19–32)
CO2: 27 mEq/L (ref 19–32)
Calcium: 9.3 mg/dL (ref 8.4–10.5)
Chloride: 103 mEq/L (ref 96–112)
Creatinine, Ser: 0.5 mg/dL (ref 0.50–1.10)
Creatinine, Ser: 0.71 mg/dL (ref 0.50–1.10)
GFR calc Af Amer: >90 mL/min (ref >90)
GFR calc Af Amer: >90 mL/min (ref >90)
GFR calc non Af Amer: >90 mL/min (ref >90)
GFR calc non Af Amer: >90 mL/min (ref >90)
Glucose, Bld: 89 mg/dL (ref 70–99)
Glucose, Bld: 121 mg/dL — ABNORMAL HIGH (ref 70–99)
Potassium: 2.5 mEq/L — CL (ref 3.5–5.1)
Sodium: 139 mEq/L (ref 135–145)
Total Bilirubin: 0.3 mg/dL (ref 0.3–1.2)
Total Protein: 6.1 g/dL (ref 6.0–8.3)

## 2013-01-29 LAB — CBC WITH DIFFERENTIAL/PLATELET
Basophils Relative: 1 % (ref 0–1)
Eosinophils Absolute: 0.1 10*3/uL (ref 0.0–0.7)
Eosinophils Relative: 1 % (ref 0–5)
Hemoglobin: 10.3 g/dL — ABNORMAL LOW (ref 12.0–15.0)
Lymphs Abs: 2.3 10*3/uL (ref 0.7–4.0)
MCH: 30.4 pg (ref 26.0–34.0)
MCHC: 33.8 g/dL (ref 30.0–36.0)
MCV: 90 fL (ref 78.0–100.0)
Monocytes Absolute: 0.7 10*3/uL (ref 0.1–1.0)
Neutrophils Relative %: 46 % (ref 43–77)
RBC: 3.39 MIL/uL — ABNORMAL LOW (ref 3.87–5.11)

## 2013-01-29 LAB — BASIC METABOLIC PANEL
CO2: 25 mEq/L (ref 19–32)
Calcium: 10.2 mg/dL (ref 8.4–10.5)
Creatinine, Ser: 0.69 mg/dL (ref 0.50–1.10)
GFR calc non Af Amer: >90 mL/min (ref >90)
Glucose, Bld: 71 mg/dL (ref 70–99)
Sodium: 137 mEq/L (ref 135–145)

## 2013-01-29 MED ORDER — NEOMYCIN SULFATE 500 MG PO TABS
1000.0000 mg | ORAL_TABLET | ORAL | Status: DC
  Filled 2013-01-29: qty 2

## 2013-01-29 MED ORDER — ENSURE COMPLETE PO LIQD
237.0000 mL | Freq: Two times a day (BID) | ORAL | Status: DC
  Administered 2013-01-29 – 2013-01-30 (×3): 237 mL via ORAL

## 2013-01-29 MED ORDER — OXYCODONE-ACETAMINOPHEN 5-325 MG PO TABS
1.0000 | ORAL_TABLET | ORAL | Status: DC | PRN
  Administered 2013-01-29 (×3): 2 via ORAL
  Filled 2013-01-29 (×3): qty 2

## 2013-01-29 MED ORDER — CIPROFLOXACIN IN D5W 400 MG/200ML IV SOLN
400.0000 mg | INTRAVENOUS | Status: DC
  Filled 2013-01-29: qty 200

## 2013-01-29 MED ORDER — METRONIDAZOLE IN NACL 5-0.79 MG/ML-% IV SOLN
500.0000 mg | INTRAVENOUS | Status: DC
  Filled 2013-01-29: qty 100

## 2013-01-29 MED ORDER — POTASSIUM CHLORIDE CRYS ER 20 MEQ PO TBCR
20.0000 meq | EXTENDED_RELEASE_TABLET | Freq: Three times a day (TID) | ORAL | Status: DC
  Administered 2013-01-29 – 2013-01-30 (×5): 20 meq via ORAL
  Filled 2013-01-29 (×7): qty 1

## 2013-01-29 MED ORDER — ERYTHROMYCIN BASE 250 MG PO TABS
1000.0000 mg | ORAL_TABLET | ORAL | Status: DC
  Filled 2013-01-29: qty 4

## 2013-01-29 MED ORDER — MAGNESIUM OXIDE 400 (241.3 MG) MG PO TABS
400.0000 mg | ORAL_TABLET | Freq: Every day | ORAL | Status: DC
  Administered 2013-01-29 – 2013-01-30 (×2): 400 mg via ORAL
  Filled 2013-01-29 (×2): qty 1

## 2013-01-29 MED ORDER — PEG 3350-KCL-NA BICARB-NACL 420 G PO SOLR
4000.0000 mL | Freq: Once | ORAL | Status: DC
  Filled 2013-01-29: qty 4000

## 2013-01-29 MED ORDER — ALVIMOPAN 12 MG PO CAPS
12.0000 mg | ORAL_CAPSULE | Freq: Once | ORAL | Status: DC
  Filled 2013-01-29: qty 1

## 2013-01-29 NOTE — Care Management Note (Unsigned)
    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     Action/Plan:   Anticipated DC Date:  02/01/2013   Anticipated DC Plan:  HOME/SELF CARE      DC Planning Services  CM consult      Choice offered to / List presented to:             Status of service:  In process, will continue to follow Medicare Important Message given?   (If response is "NO", the following Medicare IM given date fields will be blank) Date Medicare IM given:   Date Additional Medicare IM given:    Discharge Disposition:    Per UR Regulation:    If discussed at Long Length of Stay Meetings, dates discussed:    Comments:

## 2013-01-29 NOTE — Progress Notes (Signed)
GS Progress Note Subjective: Patient has developed swelling of her flank on the right and her right thigh.  None of these problems are surgical.  I do plan on operating on her within the next three weeks or so, probably to re-explore and control her fistula  Objective: Vital signs in last 24 hours: Temp:  [97.7 F (36.5 C)-98.3 F (36.8 C)] 98.3 F (36.8 C) (05/09 0500) Pulse Rate:  [76-86] 76 (05/09 0500) Resp:  [18-20] 20 (05/09 0500) BP: (147-151)/(102) 150/102 mmHg (05/09 0500) SpO2:  [96 %-100 %] 100 % (05/09 0500) Last BM Date: 01/28/13  Intake/Output from previous day: 05/08 0701 - 05/09 0700 In: 300 [IV Piggyback:300] Out: 700 [Urine:150; Stool:450] Intake/Output this shift:    Lungs: Clear, although she does have a persistent cough  Abd: Benign, left flank foul-smelling drain in place.  Draining about 100cc over the last 24 hours  Extremities: Right antero-lateral thigh edema.  Unknown cause  Neuro: Intact  Lab Results: CBC   Recent Labs  01/28/13 0051 01/28/13 0900  WBC 7.9 7.0  HGB 9.4* 9.3*  HCT 27.2* 27.0*  PLT 249 370   BMET  Recent Labs  01/28/13 0051 01/28/13 0900 01/29/13 0517  NA 138  --  137  K 2.5*  --  3.1*  CL 103  --  102  CO2 24  --  25  GLUCOSE 89  --  71  BUN 5*  --  6  CREATININE 0.50 0.51 0.69  CALCIUM 9.4  --  10.2   PT/INR No results found for this basename: LABPROT, INR,  in the last 72 hours ABG No results found for this basename: PHART, PCO2, PO2, HCO3,  in the last 72 hours  Studies/Results: Ct Abdomen Pelvis W Contrast  01/28/2013  *RADIOLOGY REPORT*  Clinical Data: Left lower quadrant abdominal pain with lower extremity and abdominal edema.  Left lower quadrant drain placed 7 months ago.  History of cervical cancer, radiation and chemotherapy, sigmoid colectomy with ostomy.  CT ABDOMEN AND PELVIS WITH CONTRAST  Technique:  Multidetector CT imaging of the abdomen and pelvis was performed following the standard  protocol during bolus administration of intravenous contrast.  Contrast: 80mL OMNIPAQUE IOHEXOL 300 MG/ML  SOLN  Comparison: Abdominal pelvic CT 12/03/2012.  Barium enema 01/18/2013.  Findings: Images through the lung bases demonstrate stable cardiomegaly and linear scarring or atelectasis at both lung bases. There are trace bilateral pleural effusions.  There is mild ascites with diffuse soft tissue edema consistent with anasarca.  The abdomen is imaged prior to portal vein opacification.  There is some retrograde filling of the IVC and hepatic veins.  The hepatic steatosis has improved compared with the most recent study.  The previously noted low density lesions in the right and left hepatic lobes are grossly stable. The gallbladder is surgically absent. There is a stable nonobstructing calculus in the lower pole of the left kidney.  There is no hydronephrosis.  The right kidney, spleen, pancreas and adrenal glands appear normal.  Descending colostomy and left lower quadrant percutaneous drain appear unchanged.  Evaluation of the pelvic anatomy is limited by the anasarca and arterial phase imaging.  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 There is likely a small amount of residual extraluminal air and fluid in the left lower quadrant inferior and anterior to  the surgical drain.  This appears grossly unchanged.  There is diffuse bladder wall thickening.  There is diffuse thickening of the wall of the rectosigmoid colon and perirectal soft tissue stranding. There is still some air within the vagina, but no contrast.  There are no worrisome osseous findings.  IMPRESSION:  1.  Progressive anasarca with diffuse soft tissue edema, ascites and trace pleural effusions. 2.  Cardiomegaly with reflux of contrasted blood into the IVC and hepatic  veins suggesting a degree of right heart failure. 3.  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  No large drainable collection identified.   Original Report Authenticated By: Carey Bullocks, M.D.     Anti-infectives: Anti-infectives   None      Assessment/Plan: s/p  Advance diet Replace KCL. Not sure of the etiology of focal areas of edema, but will look at labs and see if there is anything that we can correct.  LOS: 1 day    Marta Lamas. [name], MD, FACS [phone] [facility] Surgery 01/29/2013

## 2013-01-29 NOTE — Addendum Note (Signed)
Addended by: Frederik Schmidt on: 01/29/2013 02:36 PM   Modules accepted: Orders

## 2013-01-29 NOTE — Progress Notes (Signed)
Pts BP 147/102. On call MD notified. No new orders given. Will continue to monitor

## 2013-01-29 NOTE — Progress Notes (Signed)
[facility] PROGRESS NOTE  Vanessa [name] BJY:[id number] DOB: July 22, 1976 DOA: 01/28/2013 PCP: Dorrene German, MD  Assessment/Plan: 1. Abdominal pain: -due to complex hx of surgery and going entero-colic fistula -per CCS -continue supportive care -replete electrolytes and advance diet -CT no showing any acute abscess or process that require intervention at this point  2-Abdominal and leg edema: CT suggest concerns for right heart failure.  -will check 2-D echo -hypoalbuminemia also contributing; will start ensure. -no DVT or SVT on dopplers  3-abnormal EKG: most likely due to electrolytes abnormalities. -CE'z neg -no acute CP or SOB  4-hypokalemia: will start daily maintenance and magox  5-protein calorie malnutrition (moderate): will start ensure and encourage PO intake.  DVT: lovenox   Code Status: Full Family Communication: no family at bedside Disposition Plan: home when medically stable   Consultants:  [facility]  Procedures:  See below for x-ray reports  Antibiotics:  none  HPI/Subjective: No nausea, no vomiting; denies CP or SOB  Objective: Filed Vitals:   01/28/13 0653 01/28/13 2045 01/29/13 0300 01/29/13 0500  BP: 158/118 147/102 151/102 150/102  Pulse: 81 86  76  Temp: 97.7 F (36.5 C) 97.7 F (36.5 C)  98.3 F (36.8 C)  TempSrc: Oral Oral  Oral  Resp: 20 18  20   Height: 5\' 4"  (1.626 m)     Weight: 67.132 kg (148 lb)     SpO2: 94% 96%  100%    Intake/Output Summary (Last 24 hours) at 01/29/13 1309 Last data filed at 01/28/13 2000  Gross per 24 hour  Intake      0 ml  Output    445 ml  Net   -445 ml   Filed Weights   01/28/13 0653  Weight: 67.132 kg (148 lb)    Exam:   General:  NAD, feeling better; denies N/V  Cardiovascular: S1 and S2, no rubs or gallops  Respiratory: CTA bilaterally  Abdomen: soft, colostomy bag in place and drain in place, both with liquid foul-smelling amount of feces; flank edema and mild tenderness  to deep palpation  Musculoskeletal: no joint swelling or erythema; both anterolateral aspects of her thigh felt swollen.  Data Reviewed: Basic Metabolic Panel:  Recent Labs Lab 01/28/13 0051 01/28/13 0900 01/29/13 0517  NA 138  --  137  K 2.5*  --  3.1*  CL 103  --  102  CO2 24  --  25  GLUCOSE 89  --  71  BUN 5*  --  6  CREATININE 0.50 0.51 0.69  CALCIUM 9.4  --  10.2  MG  --  1.9  --    Liver Function Tests:  Recent Labs Lab 01/28/13 0051  AST 15  ALT 15  ALKPHOS 105  BILITOT 0.3  PROT 6.2  ALBUMIN 2.7*    Recent Labs Lab 01/28/13 0051  LIPASE 12   CBC:  Recent Labs Lab 01/28/13 0051 01/28/13 0900  WBC 7.9 7.0  NEUTROABS 4.9  --   HGB 9.4* 9.3*  HCT 27.2* 27.0*  MCV 87.5 87.1  PLT 249 370   Cardiac Enzymes:  Recent Labs Lab 01/28/13 0900  TROPONINI <0.30   BNP (last 3 results) No results found for this basename: PROBNP,  in the last 8760 hours CBG: No results found for this basename: GLUCAP,  in the last 168 hours  Recent Results (from the past 240 hour(s))  CLOSTRIDIUM DIFFICILE BY PCR     Status: None   Collection Time    01/28/13  1:13 PM      Result Value Range Status   C difficile by pcr NEGATIVE  NEGATIVE Final     Studies: Ct Abdomen Pelvis W Contrast  01/28/2013  *RADIOLOGY REPORT*  Clinical Data: Left lower quadrant abdominal pain with lower extremity and abdominal edema.  Left lower quadrant drain placed 7 months ago.  History of cervical cancer, radiation and chemotherapy, sigmoid colectomy with ostomy.  CT ABDOMEN AND PELVIS WITH CONTRAST  Technique:  Multidetector CT imaging of the abdomen and pelvis was performed following the standard protocol during bolus administration of intravenous contrast.  Contrast: 80mL OMNIPAQUE IOHEXOL 300 MG/ML  SOLN  Comparison: Abdominal pelvic CT 12/03/2012.  Barium enema 01/18/2013.  Findings: Images through the lung bases demonstrate stable cardiomegaly and linear scarring or atelectasis at  both lung bases. There are trace bilateral pleural effusions.  There is mild ascites with diffuse soft tissue edema consistent with anasarca.  The abdomen is imaged prior to portal vein opacification.  There is some retrograde filling of the IVC and hepatic veins.  The hepatic steatosis has improved compared with the most recent study.  The previously noted low density lesions in the right and left hepatic lobes are grossly stable. The gallbladder is surgically absent. There is a stable nonobstructing calculus in the lower pole of the left kidney.  There is no hydronephrosis.  The right kidney, spleen, pancreas and adrenal glands appear normal.  Descending colostomy and left lower quadrant percutaneous drain appear unchanged.  Evaluation of the pelvic anatomy is limited by the anasarca and arterial phase imaging.  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 There is likely a small amount of residual extraluminal air and fluid in the left lower quadrant inferior and anterior to the surgical drain.  This appears grossly unchanged.  There is diffuse bladder wall thickening.  There is diffuse thickening of the wall of the rectosigmoid colon and perirectal soft tissue stranding. There is still some air within the vagina, but no contrast.  There are no worrisome osseous findings.  IMPRESSION:  1.  Progressive anasarca with diffuse soft tissue edema, ascites and trace pleural effusions. 2.  Cardiomegaly with reflux of contrasted blood into the IVC and hepatic veins suggesting a degree of right heart failure. 3.  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  No large drainable collection identified.   Original Report  Authenticated By: Carey Bullocks, M.D.     Scheduled Meds: . enoxaparin (LOVENOX) injection  40 mg Subcutaneous Daily  . feeding supplement  237 mL Oral BID BM  . magnesium oxide  400 mg Oral Daily  . potassium chloride  20 mEq Oral TID   Continuous Infusions:   Active Problems:   Entero-colonic fistula    Time spent: >30 minutes    Teagan Heidrick  [facility] Pager [phone]. If 7PM-7AM, please contact night-coverage at [email or web address], password [facility] 01/29/2013, 1:09 PM  LOS: 1 day

## 2013-01-30 DIAGNOSIS — I379 Nonrheumatic pulmonary valve disorder, unspecified: Secondary | ICD-10-CM

## 2013-01-30 DIAGNOSIS — R112 Nausea with vomiting, unspecified: Secondary | ICD-10-CM

## 2013-01-30 DIAGNOSIS — Z933 Colostomy status: Secondary | ICD-10-CM

## 2013-01-30 LAB — BASIC METABOLIC PANEL
CO2: 27 mEq/L (ref 19–32)
Chloride: 103 mEq/L (ref 96–112)
Creatinine, Ser: 0.7 mg/dL (ref 0.50–1.10)
GFR calc Af Amer: >90 mL/min (ref >90)
Potassium: 3.4 mEq/L — ABNORMAL LOW (ref 3.5–5.1)
Sodium: 139 mEq/L (ref 135–145)

## 2013-01-30 MED ORDER — MAGNESIUM OXIDE 400 (241.3 MG) MG PO TABS: 400.0000 mg | ORAL_TABLET | Freq: Every day | ORAL | Status: DC

## 2013-01-30 MED ORDER — ENSURE COMPLETE PO LIQD: 237.0000 mL | Freq: Two times a day (BID) | ORAL | Status: DC

## 2013-01-30 MED ORDER — OXYCODONE-ACETAMINOPHEN 10-325 MG PO TABS: 1.0000 | ORAL_TABLET | Freq: Four times a day (QID) | ORAL | Status: DC | PRN

## 2013-01-30 MED ORDER — POTASSIUM CHLORIDE CRYS ER 20 MEQ PO TBCR: 20.0000 meq | EXTENDED_RELEASE_TABLET | Freq: Three times a day (TID) | ORAL | Status: DC

## 2013-01-30 MED ORDER — ONDANSETRON 8 MG PO TBDP: 8.0000 mg | ORAL_TABLET | Freq: Three times a day (TID) | ORAL | Status: DC | PRN

## 2013-01-30 NOTE — Discharge Summary (Signed)
Physician Discharge Summary  IISHA SOYARS [name]:[id number] DOB: 1976-01-25 DOA: 01/28/2013  PCP: Dorrene German, MD  Admit date: 01/28/2013 Discharge date: 01/30/2013  Time spent: >30 minutes  Recommendations for Outpatient Follow-up:  1. BMET to follow electrolytes and kidney function 2. CBC to follow Hgb 3. Follow 2-D echo  Discharge Diagnoses:  Active Problems:   Entero-colonic fistula abnormal EKG Hypokalemia Lower extremity edema Abdominal pain, nausea and vomiting   Discharge Condition: stable and improved. No further pain, nausea or vomiting. Tolerating diet and PO meds. Will follow with PCP in 2 weeks and with [facility] as an outpatient.  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 In November 2013, surgery attempted to reanastomose the patient's ileostomy, but this was unsuccessful due to the patient's dense adhesions. As a result, a colostomy was created. The patient also developed an intrapelvic abscess for which the left lower quadrant percutaneous drain has been placed. The drain has been in place since November 2013. The patient states that she has been taking up to 6 Percocets per day or as on a good day, the patient normally takes only 2-3 Percocets. In addition, the patient states that she has been nauseous as a result only been eating ice, up to 10 cups per day. She denies any fevers, chills, chest discomfort, shortness of breath, vomiting, dysuria, hematuria.  She does note that she has had increase stool from her rectum. She still makes stool from  her ostomy. She denies any hematochezia or melena. The patient had a barium enema on 01/18/2013 which revealed a fistula between the sigmoid colon and distal small bowel. In addition, the patient also relates a history of increasing drainage from her left lower quadrant pelvic drain with feculent type material.  In the emergency department, BMP revealed hypokalemia with potassium 2.5. CBC showed anemia with a hemoglobin of 9.4, WBC 7.9. Hepatic enzymes were negative. EKG shows sinus rhythm but showed T-wave inversion in V5, V6, III, avF and prolonged QT. These were new findings when compared to previous EKG in November 2013. Urinalysis was unremarkable. Because of EKG abnormalities, hypokalemia, abdominal pain admission was requested   Hospital Course:  1-Abdominal pain, nausea/vomiting and protrusion of stoma: -due to complex hx of surgery and going entero-colic fistula  -further treatment and rec's per [facility]  -tolerating diet and medications -PRN alnalgesics and antiemetics -CT no showing any acute abscess or process that require intervention at this point  -per surgery stoma will slide back in on his own; no intervention needed.  2-Abdominal and leg edema: CT suggesting concerns for right heart failure.  -2-D echo has been done and will follow by PCP -hypoalbuminemia also contributing; started on ensure.  -no DVT or SVT on dopplers  -low sodium diet recommended  3-abnormal EKG: most likely due to electrolytes abnormalities.  -CE'z neg  -no acute CP or SOB  -2-De cho done and will follow by PCP  4-hypokalemia: at discharged potassium was 3.4; will continue daily maintenance and magox supplementation.  5-protein calorie malnutrition (moderate): will encourage PO intake and have started ensure BID.  6-Anemia: most likely  due to anemia of chronic disease and iron deficiency from menstruation and blood loss with surgery.   Procedures: See below for x-ray reports -2-D echo (done and  pending; to be follow by PCP)  Consultations:  [facility]  Discharge Exam: Filed Vitals:   01/29/13 1500 01/29/13 2105 01/30/13 0547 01/30/13 1419  BP: 149/113 138/88 132/91 146/115  Pulse: 79 83 75 86  Temp: 97.6 F (36.4 C) 97.4 F (36.3 C) 98.3 F (36.8 C) 98.5 F (36.9 C)  TempSrc: Oral Oral Oral Oral  Resp: 22 20 18 20   Height:      Weight:      SpO2: 100% 100% 98% 100%   General: NAD, feeling better; denies N/V  Cardiovascular: S1 and S2, no rubs or gallops  Respiratory: CTA bilaterally  Abdomen: soft, colostomy bag in place, stoma is sligthly swollen and protruded into bag; still functioning and per surgery assessment no immediate intervention needed, both with liquid foul-smelling amount of feces; flank edema bilaterally and mild tenderness to deep palpation  Musculoskeletal: no joint swelling or erythema; both anterolateral aspects of her thigh felt swollen.  Discharge Instructions  Discharge Orders   Future Orders Complete By Expires     Discharge instructions  As directed     Comments:      Take medications as prescribed Keep yourself well hydrated Follow with Dr. [name] at the office as instructed to schedule surgery        Medication List    TAKE these medications       feeding supplement Liqd  Take 237 mLs by mouth 2 (two) times daily between meals.     magnesium oxide 400 (241.3 MG) MG tablet  Commonly known as:  MAG-OX  Take 1 tablet (400 mg total) by mouth daily.     ondansetron 8 MG disintegrating tablet  Commonly known as:  ZOFRAN ODT  Take 1 tablet (8 mg total) by mouth every 8 (eight) hours as needed for nausea.     oxyCODONE-acetaminophen 10-325 MG per tablet  Commonly known as:  PERCOCET  Take 1 tablet by mouth every 6 (six) hours as needed for pain.     potassium chloride SA 20 MEQ tablet  Commonly known as:  K-DUR,KLOR-CON  Take 1 tablet (20 mEq total) by mouth 3 (three) times daily.       Allergies  Allergen Reactions  . Penicillins  Other (See Comments)    "anything w/penicillin in it makes me bleed"  . Labetalol Hcl Itching and Other (See Comments)    Bumps to bilateral arms.  . Morphine And Related Hives       Follow-up Information   Follow up with AVBUERE,EDWIN A, MD. Schedule an appointment as soon as possible for a visit in 10 days.   Contact information:   [address] [place] [address] [phone]       Follow up with WYATT, Marta Lamas, MD. (call office to set up appointment)    Contact information:   [address], [address]  [facility] [place] [address] [phone]        The results of significant diagnostics from this hospitalization (including imaging, microbiology, ancillary and laboratory) are listed below for reference.    Significant Diagnostic Studies: Ct Abdomen Pelvis W Contrast  01/28/2013  *RADIOLOGY REPORT*  Clinical Data: Left lower quadrant abdominal pain with lower extremity and abdominal edema.  Left lower quadrant drain placed 7 months ago.  History of cervical cancer, radiation  and chemotherapy, sigmoid colectomy with ostomy.  CT ABDOMEN AND PELVIS WITH CONTRAST  Technique:  Multidetector CT imaging of the abdomen and pelvis was performed following the standard protocol during bolus administration of intravenous contrast.  Contrast: 80mL OMNIPAQUE IOHEXOL 300 MG/ML  SOLN  Comparison: Abdominal pelvic CT 12/03/2012.  Barium enema 01/18/2013.  Findings: Images through the lung bases demonstrate stable cardiomegaly and linear scarring or atelectasis at both lung bases. There are trace bilateral pleural effusions.  There is mild ascites with diffuse soft tissue edema consistent with anasarca.  The abdomen is imaged prior to portal vein opacification.  There is some retrograde filling of the IVC and hepatic veins.  The hepatic steatosis has improved compared with the most recent study.  The previously noted low density lesions in the right and left hepatic lobes  are grossly stable. The gallbladder is surgically absent. There is a stable nonobstructing calculus in the lower pole of the left kidney.  There is no hydronephrosis.  The right kidney, spleen, pancreas and adrenal glands appear normal.  Descending colostomy and left lower quadrant percutaneous drain appear unchanged.  Evaluation of the pelvic anatomy is limited by the anasarca and arterial phase imaging.  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 There is likely a small amount of residual extraluminal air and fluid in the left lower quadrant inferior and anterior to the surgical drain.  This appears grossly unchanged.  There is diffuse bladder wall thickening.  There is diffuse thickening of the wall of the rectosigmoid colon and perirectal soft tissue stranding. There is still some air within the vagina, but no contrast.  There are no worrisome osseous findings.  IMPRESSION:  1.  Progressive anasarca with diffuse soft tissue edema, ascites and trace pleural effusions. 2.  Cardiomegaly with reflux of contrasted blood into the IVC and hepatic veins suggesting a degree of right heart failure. 3.  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  No large drainable collection identified.   Original Report Authenticated By: Carey Bullocks, M.D.    Dg Colon W/cm - Wo/w Kub  01/18/2013  *RADIOLOGY REPORT*  Clinical Data: No complex surgical history.  The patient has a cervical cancer radiation therapy the pelvis.  Subsequent a diverting colostomy and Hartmann's pouch.  Persistent drainage of stool through the Hartmann's pouch.  The patient additionally has a percutaneous drain in the pelvis.  The  SINGLE COLUMN  BARIUM ENEMA  Technique:  Initial scout AP supine abdominal image obtained to insure adequate colon cleansing.  Barium was introduced into the colon in a retrograde fashion and refluxed from the rectum to the cecum. Spot images of the colon followed by overhead radiographs were obtained.  Fluoroscopy time: 2 minutes 29 seconds  Comparison:  CT 12/03/2012, and fistulogram, 08/25/2012, small bowel follow through the 09/20/2012  Findings:  A Foley catheter was inserted per rectum and 5 cc  gas insufflated into the retention balloon.  Under fluoroscopic observation are soluble contrast was administered via gravity feed per rectum.  Contrast filled the Hartmann's pouch.  Contrast was quickly seen within the cecum.  Contrast was also quickly seen within the distal small bowel.  Contrast flowed antegrade into the ascending and transverse colon to the level of the  splenic flexure. The is high density contrast within the terminal ileum.  No significant contrast flowed into the percutaneous drain.  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  Findings discussed with Dr. [name] on 01/18/2013.   Original Report Authenticated By: Genevive Bi, M.D.     Microbiology: Recent Results (from the past 240 hour(s))  CLOSTRIDIUM DIFFICILE BY PCR     Status: None   Collection Time    01/28/13  1:13 PM      Result Value Range Status   C difficile by pcr NEGATIVE  NEGATIVE Final     Labs: Basic Metabolic Panel:  Recent Labs Lab 01/28/13 0051 01/28/13 0900 01/29/13 0517 01/29/13 1706 01/30/13 0405  NA 138  --  137 139 139  K 2.5*  --  3.1* 2.9* 3.4*  CL 103  --  102 104 103  CO2 24  --  25 27 27   GLUCOSE 89  --  71 121* 110*  BUN 5*  --  6 7 8   CREATININE 0.50 0.51 0.69 0.71 0.70  CALCIUM 9.4  --  10.2 9.3 9.4  MG  --  1.9   --   --   --    Liver Function Tests:  Recent Labs Lab 01/28/13 0051 01/29/13 1706  AST 15 18  ALT 15 17  ALKPHOS 105 112  BILITOT 0.3 0.4  PROT 6.2 6.1  ALBUMIN 2.7* 2.9*    Recent Labs Lab 01/28/13 0051  LIPASE 12   CBC:  Recent Labs Lab 01/28/13 0051 01/28/13 0900 01/29/13 1706  WBC 7.9 7.0 5.7  NEUTROABS 4.9  --  2.5  HGB 9.4* 9.3* 10.3*  HCT 27.2* 27.0* 30.5*  MCV 87.5 87.1 90.0  PLT 249 370 491*   Cardiac Enzymes:  Recent Labs Lab 01/28/13 0900  TROPONINI <0.30    Signed:  Izreal Kock  [facility] 01/30/2013, 3:34 PM

## 2013-01-30 NOTE — Progress Notes (Signed)
  Echocardiogram 2D Echocardiogram has been performed.  Vanessa [name] 01/30/2013, 1:31 PM 

## 2013-01-30 NOTE — Progress Notes (Signed)
01/1013 Patient's stoma has swollen, call placed to physician, received call back states he will place a surgical consult to have them look at it.

## 2013-01-30 NOTE — Progress Notes (Signed)
Patient ID: Vanessa [name], female   DOB: [birth year], 37 y.o.   MRN: [id number]   Ostomy protruded further  Was able to slightly reduce.  Will follow

## 2013-01-30 NOTE — Progress Notes (Signed)
01/30/13 Patient is going home today, IV site removed and discharge instructions reviewed with patient.

## 2013-02-01 ENCOUNTER — Encounter (HOSPITAL_COMMUNITY): Payer: Self-pay | Admitting: *Deleted

## 2013-02-01 ENCOUNTER — Emergency Department (HOSPITAL_COMMUNITY)
Admission: EM | Admit: 2013-02-01 | Discharge: 2013-02-01 | Disposition: A | Discharge status: Home / Self Care | Payer: Medicaid Other | Attending: Emergency Medicine | Admitting: Emergency Medicine

## 2013-02-01 ENCOUNTER — Emergency Department (HOSPITAL_COMMUNITY): Payer: Medicaid Other

## 2013-02-01 DIAGNOSIS — Z87891 Personal history of nicotine dependence: Secondary | ICD-10-CM | POA: Insufficient documentation

## 2013-02-01 DIAGNOSIS — Z8541 Personal history of malignant neoplasm of cervix uteri: Secondary | ICD-10-CM | POA: Insufficient documentation

## 2013-02-01 DIAGNOSIS — Z923 Personal history of irradiation: Secondary | ICD-10-CM | POA: Insufficient documentation

## 2013-02-01 DIAGNOSIS — Z8639 Personal history of other endocrine, nutritional and metabolic disease: Secondary | ICD-10-CM | POA: Insufficient documentation

## 2013-02-01 DIAGNOSIS — Z880 Allergy status to penicillin: Secondary | ICD-10-CM | POA: Insufficient documentation

## 2013-02-01 DIAGNOSIS — I10 Essential (primary) hypertension: Secondary | ICD-10-CM | POA: Insufficient documentation

## 2013-02-01 DIAGNOSIS — R1032 Left lower quadrant pain: Secondary | ICD-10-CM | POA: Insufficient documentation

## 2013-02-01 DIAGNOSIS — R109 Unspecified abdominal pain: Secondary | ICD-10-CM

## 2013-02-01 DIAGNOSIS — Z862 Personal history of diseases of the blood and blood-forming organs and certain disorders involving the immune mechanism: Secondary | ICD-10-CM | POA: Insufficient documentation

## 2013-02-01 LAB — URINE MICROSCOPIC-ADD ON

## 2013-02-01 LAB — URINALYSIS, ROUTINE W REFLEX MICROSCOPIC
Nitrite: NEGATIVE
Specific Gravity, Urine: 1.023 (ref 1.005–1.030)
Urobilinogen, UA: 1 mg/dL (ref 0.0–1.0)
pH: 6 (ref 5.0–8.0)

## 2013-02-01 LAB — CBC WITH DIFFERENTIAL/PLATELET
Eosinophils Absolute: 0.1 10*3/uL (ref 0.0–0.7)
Eosinophils Relative: 1 % (ref 0–5)
Lymphocytes Relative: 32 % (ref 12–46)
MCH: 29.9 pg (ref 26.0–34.0)
MCHC: 33.2 g/dL (ref 30.0–36.0)
Monocytes Absolute: 0.6 10*3/uL (ref 0.1–1.0)
Neutrophils Relative %: 57 % (ref 43–77)
Platelets: 483 10*3/uL — ABNORMAL HIGH (ref 150–400)
RBC: 3.18 MIL/uL — ABNORMAL LOW (ref 3.87–5.11)

## 2013-02-01 LAB — COMPREHENSIVE METABOLIC PANEL
ALT: 16 U/L (ref 0–35)
AST: 19 U/L (ref 0–37)
Calcium: 9.6 mg/dL (ref 8.4–10.5)
Potassium: 3.1 mEq/L — ABNORMAL LOW (ref 3.5–5.1)
Sodium: 134 mEq/L — ABNORMAL LOW (ref 135–145)
Total Protein: 6.2 g/dL (ref 6.0–8.3)

## 2013-02-01 LAB — LIPASE, BLOOD: Lipase: 8 U/L — ABNORMAL LOW (ref 11–59)

## 2013-02-01 MED ORDER — METOCLOPRAMIDE HCL 5 MG/ML IJ SOLN
10.0000 mg | Freq: Once | INTRAMUSCULAR | Status: AC
  Administered 2013-02-01: 10 mg via INTRAVENOUS
  Filled 2013-02-01: qty 2

## 2013-02-01 MED ORDER — HYDROMORPHONE HCL PF 1 MG/ML IJ SOLN
1.0000 mg | Freq: Once | INTRAMUSCULAR | Status: AC
  Administered 2013-02-01: 1 mg via INTRAVENOUS
  Filled 2013-02-01: qty 1

## 2013-02-01 MED ORDER — METOCLOPRAMIDE HCL 10 MG PO TABS: 10.0000 mg | ORAL_TABLET | Freq: Four times a day (QID) | ORAL | Status: DC | PRN

## 2013-02-01 NOTE — ED Provider Notes (Signed)
History     CSN: [id number]  Arrival date & time 02/01/13  0155   First MD Initiated Contact with Patient 02/01/13 0423      Chief Complaint  Patient presents with  . Abdominal Pain    (Consider location/radiation/quality/duration/timing/severity/associated sxs/prior treatment) Patient is a 37 y.o. [name] presenting with abdominal pain.  Abdominal Pain  Pt with history of chronic abdominal pain related to previous treatment for cervical cancer with multiple abdominal surgeries, colostomy. She was recently admitted for same as well as hypokalemia. 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 She had her K+ repleted and was discharged day before yesterday. States still having severe pain and nausea. Denies fever or dysuria. She is waiting to hear from [facility] regarding their plan for treatment of the fistula.   Past Medical History  Diagnosis Date  . History of blood transfusion     "I had ~ 7 in 01/2012"  . Anemia   . Cervical cancer ~ 2011    cervical  . Radiation Nov.18,2011-Jan.23,2012    cervix  . Hypertension     no treatment necessary for BP  since July 2013    Past Surgical History  Procedure Laterality Date  . Radiation implants  ~ 2011    "for cervical cancer"  . Colonoscopy  01/30/2012    Procedure: COLONOSCOPY;  Surgeon: Beverley Fiedler, MD;  Location: [facility] ENDOSCOPY;  Service: Gastroenterology;  Laterality: N/A;  . Colonoscopy  01/30/2012    Procedure: COLONOSCOPY;  Surgeon: Beverley Fiedler, MD;  Location: [facility] OR;  Service: Gastroenterology;  Laterality: N/A;  . Colostomy revision  02/04/2012    Procedure: COLON RESECTION SIGMOID;  Surgeon: Cherylynn Ridges, MD;  Location: [facility] OR;  Service: General;  Laterality: N/A;  . Cholecystectomy  2006  . Laparotomy  08/11/2012    Procedure: EXPLORATORY LAPAROTOMY;  Surgeon: Cherylynn Ridges, MD;  Location: [facility] OR;  Service: General;  Laterality: N/A;  . Laparoscopic lysis of adhesions  08/11/2012     Procedure: LAPAROSCOPIC LYSIS OF ADHESIONS;  Surgeon: Cherylynn Ridges, MD;  Location: [facility] OR;  Service: General;  Laterality: N/A;  . Bowel resection  08/11/2012    Procedure: SMALL BOWEL RESECTION;  Surgeon: Cherylynn Ridges, MD;  Location: [facility] OR;  Service: General;  Laterality: N/A;  . Colostomy  08/11/2012    Procedure: COLOSTOMY;  Surgeon: Cherylynn Ridges, MD;  Location: [facility] OR;  Service: General;  Laterality: N/A;    No family history on file.  History  Substance Use Topics  . Smoking status: Former Smoker -- 0.50 packs/day for .5 years    Types: Cigarettes    Quit date: 01/26/2012  . Smokeless tobacco: Never Used  . Alcohol Use: 0.0 oz/week     Comment: 03/16/12 "haven't drank in ~ 2-3 years"    OB History   Grav Para Term Preterm Abortions TAB SAB Ect Mult Living   10 8              Review of Systems  Gastrointestinal: Positive for abdominal pain.   All other systems reviewed and are negative except as noted in HPI.   Allergies  Penicillins; Labetalol hcl; and Morphine and related  Home Medications   Current Outpatient Rx  Name  Route  Sig  Dispense  Refill  . feeding supplement (ENSURE COMPLETE) LIQD   Oral   Take 237 mLs by mouth 2 (two) times daily between meals.         Marland Kitchen  magnesium oxide (MAG-OX) 400 (241.3 MG) MG tablet   Oral   Take 1 tablet (400 mg total) by mouth daily.   30 tablet   1   . ondansetron (ZOFRAN ODT) 8 MG disintegrating tablet   Oral   Take 1 tablet (8 mg total) by mouth every 8 (eight) hours as needed for nausea.   20 tablet   0   . oxyCODONE-acetaminophen (PERCOCET) 10-325 MG per tablet   Oral   Take 1 tablet by mouth every 6 (six) hours as needed for pain.   45 tablet   0   . potassium chloride SA (K-DUR,KLOR-CON) 20 MEQ tablet   Oral   Take 1 tablet (20 mEq total) by mouth 3 (three) times daily.   90 tablet   0     BP 140/100  Pulse 85  Temp(Src) 98 F (36.7 C) (Oral)  Resp 18  SpO2 100%  Physical Exam  Nursing  note and vitals reviewed. Constitutional: She is oriented to person, place, and time. She appears well-developed and well-nourished.  HENT:  Head: Normocephalic and atraumatic.  Eyes: EOM are normal. Pupils are equal, round, and reactive to light.  Neck: Normal range of motion. Neck supple.  Cardiovascular: Normal rate, normal heart sounds and intact distal pulses.   Pulmonary/Chest: Effort normal and breath sounds normal.  Abdominal: Soft. Bowel sounds are normal. She exhibits no distension. There is tenderness (L sided tenderness, moderate). There is no rebound and no guarding.  Colostomy in LLQ and JP drain in L side abd  Musculoskeletal: Normal range of motion. She exhibits no edema and no tenderness.  Neurological: She is alert and oriented to person, place, and time. She has normal strength. No cranial nerve deficit or sensory deficit.  Skin: Skin is warm and dry. No rash noted.  Psychiatric: She has a normal mood and affect.    ED Course  Procedures (including critical care time)  Labs Reviewed  LIPASE, BLOOD - Abnormal; Notable for the following:    Lipase 8 (*)    All other components within normal limits  URINALYSIS, ROUTINE W REFLEX MICROSCOPIC - Abnormal; Notable for the following:    Color, Urine AMBER (*)    APPearance CLOUDY (*)    Hgb urine dipstick LARGE (*)    Bilirubin Urine SMALL (*)    Protein, ur 100 (*)    Leukocytes, UA SMALL (*)    All other components within normal limits  CBC WITH DIFFERENTIAL - Abnormal; Notable for the following:    RBC 3.18 (*)    Hemoglobin 9.5 (*)    HCT 28.6 (*)    Platelets 483 (*)    All other components within normal limits  COMPREHENSIVE METABOLIC PANEL - Abnormal; Notable for the following:    Sodium 134 (*)    Potassium 3.1 (*)    Glucose, Bld 135 (*)    Albumin 2.8 (*)    All other components within normal limits  URINE MICROSCOPIC-ADD ON - Abnormal; Notable for the following:    Squamous Epithelial / LPF FEW (*)     Bacteria, UA FEW (*)    Casts HYALINE CASTS (*)    All other components within normal limits  URINE CULTURE   Dg Abd Acute W/chest  02/01/2013  *RADIOLOGY REPORT*  Clinical Data: Abdominal pain  ACUTE ABDOMEN SERIES (ABDOMEN 2 VIEW & CHEST 1 VIEW)  Comparison: 01/28/2013 CT  Findings: Cardiomegaly.  Central vascular congestion. No focal consolidation.  Surgical clips right upper  quadrant.  No free intraperitoneal air. Surgical drain projects over the left lower quadrant.  There is air within normal caliber colon and a small amount of contrast within the left lower colon projecting just above the colostomy. There is also high attenuation over the colostomy, most in keeping with ingested contrast from the recent comparison. 2 small nonspecific metallic densities project over the mid pelvis. Gentle leftward curvature of the spine.  No acute osseous finding.  IMPRESSION: Surgical drain projects over the left lower abdomen/upper pelvis.  Nonspecific bowel gas pattern without overt obstruction.   Original Report Authenticated By: Jearld Lesch, M.D.      1. Abdominal pain       MDM  Pt feeling better after meds. Labs unchanged from previous. No SBO or free air on AAS. Attempting PO trial now.   6:15 AM Pt feeling better and wants to go home. Advised PCP and general surgery followup.     Charles B. Bernette Mayers, MD 02/01/13 [phone]

## 2013-02-01 NOTE — ED Notes (Signed)
Pt states that she was having abdominal pain before being Discharged from 5500. Pain in LUQ. LBM 01/30/2013

## 2013-02-02 LAB — URINE CULTURE

## 2013-02-03 ENCOUNTER — Inpatient Hospital Stay (HOSPITAL_COMMUNITY)
Admission: EM | Admit: 2013-02-03 | Discharge: 2013-02-19 | DRG: 347 | Disposition: A | Discharge status: Home Health | Payer: Medicaid Other | Attending: General Surgery | Admitting: General Surgery

## 2013-02-03 ENCOUNTER — Encounter (HOSPITAL_COMMUNITY): Payer: Self-pay | Admitting: Anesthesiology

## 2013-02-03 ENCOUNTER — Encounter (HOSPITAL_COMMUNITY): Admission: EM | Disposition: A | Discharge status: Home Health | Payer: Self-pay | Source: Home / Self Care

## 2013-02-03 ENCOUNTER — Inpatient Hospital Stay (HOSPITAL_COMMUNITY): Payer: Medicaid Other | Admitting: Anesthesiology

## 2013-02-03 ENCOUNTER — Encounter (HOSPITAL_COMMUNITY): Payer: Self-pay | Admitting: *Deleted

## 2013-02-03 DIAGNOSIS — I10 Essential (primary) hypertension: Secondary | ICD-10-CM

## 2013-02-03 DIAGNOSIS — D62 Acute posthemorrhagic anemia: Secondary | ICD-10-CM

## 2013-02-03 DIAGNOSIS — Z87891 Personal history of nicotine dependence: Secondary | ICD-10-CM

## 2013-02-03 DIAGNOSIS — K625 Hemorrhage of anus and rectum: Secondary | ICD-10-CM | POA: Diagnosis present

## 2013-02-03 DIAGNOSIS — Z789 Other specified health status: Secondary | ICD-10-CM

## 2013-02-03 DIAGNOSIS — I5041 Acute combined systolic (congestive) and diastolic (congestive) heart failure: Secondary | ICD-10-CM | POA: Diagnosis not present

## 2013-02-03 DIAGNOSIS — E43 Unspecified severe protein-calorie malnutrition: Secondary | ICD-10-CM | POA: Insufficient documentation

## 2013-02-03 DIAGNOSIS — Z933 Colostomy status: Secondary | ICD-10-CM

## 2013-02-03 DIAGNOSIS — K651 Peritoneal abscess: Secondary | ICD-10-CM

## 2013-02-03 DIAGNOSIS — D649 Anemia, unspecified: Secondary | ICD-10-CM

## 2013-02-03 DIAGNOSIS — Z7901 Long term (current) use of anticoagulants: Secondary | ICD-10-CM

## 2013-02-03 DIAGNOSIS — IMO0002 Reserved for concepts with insufficient information to code with codable children: Secondary | ICD-10-CM

## 2013-02-03 DIAGNOSIS — I82409 Acute embolism and thrombosis of unspecified deep veins of unspecified lower extremity: Secondary | ICD-10-CM | POA: Diagnosis not present

## 2013-02-03 DIAGNOSIS — E876 Hypokalemia: Secondary | ICD-10-CM | POA: Diagnosis not present

## 2013-02-03 DIAGNOSIS — N1330 Unspecified hydronephrosis: Secondary | ICD-10-CM | POA: Diagnosis not present

## 2013-02-03 DIAGNOSIS — R609 Edema, unspecified: Secondary | ICD-10-CM

## 2013-02-03 DIAGNOSIS — R600 Localized edema: Secondary | ICD-10-CM | POA: Diagnosis present

## 2013-02-03 DIAGNOSIS — D75839 Thrombocytosis, unspecified: Secondary | ICD-10-CM | POA: Diagnosis present

## 2013-02-03 DIAGNOSIS — I272 Pulmonary hypertension, unspecified: Secondary | ICD-10-CM | POA: Diagnosis present

## 2013-02-03 DIAGNOSIS — I428 Other cardiomyopathies: Secondary | ICD-10-CM | POA: Diagnosis present

## 2013-02-03 DIAGNOSIS — R310 Gross hematuria: Secondary | ICD-10-CM | POA: Diagnosis not present

## 2013-02-03 DIAGNOSIS — K9409 Other complications of colostomy: Secondary | ICD-10-CM | POA: Diagnosis present

## 2013-02-03 DIAGNOSIS — I82419 Acute embolism and thrombosis of unspecified femoral vein: Secondary | ICD-10-CM | POA: Diagnosis present

## 2013-02-03 DIAGNOSIS — K632 Fistula of intestine: Secondary | ICD-10-CM | POA: Diagnosis present

## 2013-02-03 DIAGNOSIS — Z8541 Personal history of malignant neoplasm of cervix uteri: Secondary | ICD-10-CM

## 2013-02-03 DIAGNOSIS — D500 Iron deficiency anemia secondary to blood loss (chronic): Secondary | ICD-10-CM | POA: Diagnosis present

## 2013-02-03 DIAGNOSIS — Z9189 Other specified personal risk factors, not elsewhere classified: Secondary | ICD-10-CM

## 2013-02-03 DIAGNOSIS — K9400 Colostomy complication, unspecified: Secondary | ICD-10-CM

## 2013-02-03 DIAGNOSIS — I429 Cardiomyopathy, unspecified: Secondary | ICD-10-CM

## 2013-02-03 DIAGNOSIS — E8809 Other disorders of plasma-protein metabolism, not elsewhere classified: Secondary | ICD-10-CM | POA: Diagnosis present

## 2013-02-03 DIAGNOSIS — D473 Essential (hemorrhagic) thrombocythemia: Secondary | ICD-10-CM | POA: Diagnosis present

## 2013-02-03 DIAGNOSIS — I509 Heart failure, unspecified: Secondary | ICD-10-CM | POA: Diagnosis not present

## 2013-02-03 DIAGNOSIS — K9189 Other postprocedural complications and disorders of digestive system: Secondary | ICD-10-CM

## 2013-02-03 DIAGNOSIS — R112 Nausea with vomiting, unspecified: Secondary | ICD-10-CM

## 2013-02-03 DIAGNOSIS — I2789 Other specified pulmonary heart diseases: Secondary | ICD-10-CM | POA: Diagnosis present

## 2013-02-03 DIAGNOSIS — Z79899 Other long term (current) drug therapy: Secondary | ICD-10-CM

## 2013-02-03 DIAGNOSIS — Z923 Personal history of irradiation: Secondary | ICD-10-CM

## 2013-02-03 HISTORY — DX: Other specified personal risk factors, not elsewhere classified: Z91.89

## 2013-02-03 HISTORY — DX: Other cardiomyopathies: I42.8

## 2013-02-03 HISTORY — PX: COLOSTOMY REVISION: SHX5232

## 2013-02-03 LAB — URINALYSIS, ROUTINE W REFLEX MICROSCOPIC
Nitrite: NEGATIVE
Specific Gravity, Urine: 1.022 (ref 1.005–1.030)
Urobilinogen, UA: 1 mg/dL (ref 0.0–1.0)

## 2013-02-03 LAB — TYPE AND SCREEN
ABO/RH(D): A POS
Antibody Screen: NEGATIVE

## 2013-02-03 LAB — CBC
HCT: 29.3 % — ABNORMAL LOW (ref 36.0–46.0)
Hemoglobin: 9.9 g/dL — ABNORMAL LOW (ref 12.0–15.0)
MCH: 30.1 pg (ref 26.0–34.0)
MCV: 89.1 fL (ref 78.0–100.0)
RBC: 3.29 MIL/uL — ABNORMAL LOW (ref 3.87–5.11)

## 2013-02-03 LAB — COMPREHENSIVE METABOLIC PANEL
ALT: 13 U/L (ref 0–35)
CO2: 27 mEq/L (ref 19–32)
Calcium: 9.5 mg/dL (ref 8.4–10.5)
Creatinine, Ser: 0.64 mg/dL (ref 0.50–1.10)
GFR calc Af Amer: >90 mL/min (ref >90)
GFR calc non Af Amer: >90 mL/min (ref >90)
Glucose, Bld: 98 mg/dL (ref 70–99)
Sodium: 138 mEq/L (ref 135–145)

## 2013-02-03 SURGERY — REVISION, COLOSTOMY
Anesthesia: General | Site: Abdomen | Wound class: Clean Contaminated

## 2013-02-03 MED ORDER — FENTANYL CITRATE 0.05 MG/ML IJ SOLN
INTRAMUSCULAR | Status: DC | PRN
  Administered 2013-02-03: 100 ug via INTRAVENOUS

## 2013-02-03 MED ORDER — HYDROMORPHONE HCL PF 1 MG/ML IJ SOLN
0.5000 mg | INTRAMUSCULAR | Status: DC | PRN
Start: 2013-02-03 — End: 2013-02-19
  Administered 2013-02-03 (×2): 1 mg via INTRAVENOUS
  Administered 2013-02-03 – 2013-02-05 (×9): 2 mg via INTRAVENOUS
  Administered 2013-02-05 – 2013-02-06 (×2): 1 mg via INTRAVENOUS
  Administered 2013-02-06: 2 mg via INTRAVENOUS
  Administered 2013-02-06: 1 mg via INTRAVENOUS
  Administered 2013-02-07: 2 mg via INTRAVENOUS
  Administered 2013-02-07 (×2): 1 mg via INTRAVENOUS
  Administered 2013-02-07: 2 mg via INTRAVENOUS
  Administered 2013-02-07 (×2): 1 mg via INTRAVENOUS
  Administered 2013-02-08 – 2013-02-11 (×17): 2 mg via INTRAVENOUS
  Administered 2013-02-12: 1 mg via INTRAVENOUS
  Administered 2013-02-12: 2 mg via INTRAVENOUS
  Administered 2013-02-12 – 2013-02-13 (×4): 1 mg via INTRAVENOUS
  Administered 2013-02-13 – 2013-02-14 (×4): 2 mg via INTRAVENOUS
  Administered 2013-02-14: 1 mg via INTRAVENOUS
  Administered 2013-02-14: 2 mg via INTRAVENOUS
  Administered 2013-02-14: 1 mg via INTRAVENOUS
  Administered 2013-02-14 – 2013-02-17 (×11): 2 mg via INTRAVENOUS
  Administered 2013-02-17: 1 mg via INTRAVENOUS
  Administered 2013-02-17: 2 mg via INTRAVENOUS
  Administered 2013-02-17 (×2): 1 mg via INTRAVENOUS
  Administered 2013-02-18: 2 mg via INTRAVENOUS
  Administered 2013-02-18: 1 mg via INTRAVENOUS
  Administered 2013-02-18 – 2013-02-19 (×4): 2 mg via INTRAVENOUS
  Filled 2013-02-03: qty 1
  Filled 2013-02-03 (×10): qty 2
  Filled 2013-02-03 (×2): qty 1
  Filled 2013-02-03 (×7): qty 2
  Filled 2013-02-03 (×2): qty 1
  Filled 2013-02-03 (×4): qty 2
  Filled 2013-02-03: qty 1
  Filled 2013-02-03: qty 2
  Filled 2013-02-03 (×2): qty 1
  Filled 2013-02-03 (×3): qty 2
  Filled 2013-02-03: qty 1
  Filled 2013-02-03 (×11): qty 2
  Filled 2013-02-03: qty 1
  Filled 2013-02-03: qty 2
  Filled 2013-02-03 (×2): qty 1
  Filled 2013-02-03: qty 2
  Filled 2013-02-03: qty 1
  Filled 2013-02-03 (×3): qty 2
  Filled 2013-02-03: qty 1
  Filled 2013-02-03 (×4): qty 2
  Filled 2013-02-03 (×2): qty 1
  Filled 2013-02-03 (×2): qty 2
  Filled 2013-02-03: qty 1
  Filled 2013-02-03 (×5): qty 2
  Filled 2013-02-03: qty 1
  Filled 2013-02-03: qty 2
  Filled 2013-02-03: qty 1

## 2013-02-03 MED ORDER — 0.9 % SODIUM CHLORIDE (POUR BTL) OPTIME
TOPICAL | Status: DC | PRN
  Administered 2013-02-03: 1000 mL

## 2013-02-03 MED ORDER — HYDROMORPHONE HCL PF 1 MG/ML IJ SOLN
0.2500 mg | INTRAMUSCULAR | Status: DC | PRN
  Administered 2013-02-03 (×2): 0.5 mg via INTRAVENOUS

## 2013-02-03 MED ORDER — MIDAZOLAM HCL 5 MG/5ML IJ SOLN
INTRAMUSCULAR | Status: DC | PRN
  Administered 2013-02-03: 2 mg via INTRAVENOUS

## 2013-02-03 MED ORDER — METOCLOPRAMIDE HCL 10 MG PO TABS
10.0000 mg | ORAL_TABLET | Freq: Four times a day (QID) | ORAL | Status: DC | PRN
  Administered 2013-02-06 – 2013-02-07 (×3): 10 mg via ORAL
  Filled 2013-02-03 (×3): qty 1

## 2013-02-03 MED ORDER — SODIUM CHLORIDE 0.9 % IV SOLN
1.0000 g | Freq: Once | INTRAVENOUS | Status: DC
  Filled 2013-02-03: qty 1

## 2013-02-03 MED ORDER — HYDROMORPHONE HCL PF 2 MG/ML IJ SOLN
2.0000 mg | Freq: Once | INTRAMUSCULAR | Status: AC
  Administered 2013-02-03: 2 mg via INTRAVENOUS
  Filled 2013-02-03: qty 1

## 2013-02-03 MED ORDER — OXYCODONE-ACETAMINOPHEN 5-325 MG PO TABS
2.0000 | ORAL_TABLET | Freq: Four times a day (QID) | ORAL | Status: DC | PRN
  Administered 2013-02-03 – 2013-02-07 (×7): 2 via ORAL
  Filled 2013-02-03 (×7): qty 2

## 2013-02-03 MED ORDER — ERTAPENEM SODIUM 1 G IJ SOLR
1.0000 g | INTRAMUSCULAR | Status: DC
  Administered 2013-02-03: 1 g via INTRAVENOUS
  Filled 2013-02-03 (×2): qty 1

## 2013-02-03 MED ORDER — POTASSIUM CHLORIDE CRYS ER 20 MEQ PO TBCR
40.0000 meq | EXTENDED_RELEASE_TABLET | Freq: Once | ORAL | Status: AC
  Administered 2013-02-03: 40 meq via ORAL
  Filled 2013-02-03: qty 2

## 2013-02-03 MED ORDER — ONDANSETRON HCL 4 MG/2ML IJ SOLN
4.0000 mg | Freq: Once | INTRAMUSCULAR | Status: AC
  Administered 2013-02-03: 4 mg via INTRAVENOUS
  Filled 2013-02-03: qty 2

## 2013-02-03 MED ORDER — HYDROMORPHONE HCL PF 1 MG/ML IJ SOLN
INTRAMUSCULAR | Status: AC
  Filled 2013-02-03: qty 1

## 2013-02-03 MED ORDER — LABETALOL HCL 5 MG/ML IV SOLN
5.0000 mg | INTRAVENOUS | Status: DC | PRN
  Administered 2013-02-03: 5 mg via INTRAVENOUS

## 2013-02-03 MED ORDER — PROPOFOL 10 MG/ML IV BOLUS
INTRAVENOUS | Status: DC | PRN
  Administered 2013-02-03: 110 mg via INTRAVENOUS

## 2013-02-03 MED ORDER — ENSURE COMPLETE PO LIQD
237.0000 mL | Freq: Two times a day (BID) | ORAL | Status: DC
  Administered 2013-02-04 – 2013-02-19 (×20): 237 mL via ORAL

## 2013-02-03 MED ORDER — ENOXAPARIN SODIUM 40 MG/0.4ML ~~LOC~~ SOLN
40.0000 mg | SUBCUTANEOUS | Status: DC
  Administered 2013-02-04 – 2013-02-11 (×7): 40 mg via SUBCUTANEOUS
  Filled 2013-02-03 (×12): qty 0.4

## 2013-02-03 MED ORDER — LIDOCAINE HCL (CARDIAC) 20 MG/ML IV SOLN
INTRAVENOUS | Status: DC | PRN
  Administered 2013-02-03: 30 mg via INTRAVENOUS

## 2013-02-03 MED ORDER — NEOSTIGMINE METHYLSULFATE 1 MG/ML IJ SOLN
INTRAMUSCULAR | Status: DC | PRN
  Administered 2013-02-03: 4 mg via INTRAVENOUS

## 2013-02-03 MED ORDER — ONDANSETRON HCL 4 MG/2ML IJ SOLN
4.0000 mg | Freq: Four times a day (QID) | INTRAMUSCULAR | Status: DC | PRN
  Administered 2013-02-04 – 2013-02-19 (×18): 4 mg via INTRAVENOUS
  Filled 2013-02-03 (×18): qty 2

## 2013-02-03 MED ORDER — LACTATED RINGERS IV SOLN
INTRAVENOUS | Status: DC | PRN
  Administered 2013-02-03 (×2): via INTRAVENOUS

## 2013-02-03 MED ORDER — LABETALOL HCL 5 MG/ML IV SOLN
INTRAVENOUS | Status: AC
  Filled 2013-02-03: qty 4

## 2013-02-03 MED ORDER — KCL IN DEXTROSE-NACL 20-5-0.45 MEQ/L-%-% IV SOLN
INTRAVENOUS | Status: DC
  Administered 2013-02-03: 11:00:00 via INTRAVENOUS
  Filled 2013-02-03 (×4): qty 1000

## 2013-02-03 MED ORDER — ONDANSETRON HCL 4 MG/2ML IJ SOLN
INTRAMUSCULAR | Status: DC | PRN
  Administered 2013-02-03: 4 mg via INTRAVENOUS

## 2013-02-03 MED ORDER — SODIUM CHLORIDE 0.9 % IV SOLN
Freq: Once | INTRAVENOUS | Status: AC
  Administered 2013-02-03: 75 mL/h via INTRAVENOUS

## 2013-02-03 MED ORDER — KCL IN DEXTROSE-NACL 20-5-0.45 MEQ/L-%-% IV SOLN
INTRAVENOUS | Status: AC
  Administered 2013-02-03 – 2013-02-04 (×2): via INTRAVENOUS
  Administered 2013-02-05: 10 mL/h via INTRAVENOUS
  Administered 2013-02-05: 20 mL/h via INTRAVENOUS
  Administered 2013-02-08: 10 mL/h via INTRAVENOUS
  Administered 2013-02-09: 03:00:00 via INTRAVENOUS
  Filled 2013-02-03 (×6): qty 1000

## 2013-02-03 MED ORDER — LACTATED RINGERS IV SOLN
INTRAVENOUS | Status: DC
  Administered 2013-02-03: 12:00:00 via INTRAVENOUS

## 2013-02-03 MED ORDER — PANTOPRAZOLE SODIUM 40 MG IV SOLR
40.0000 mg | Freq: Every day | INTRAVENOUS | Status: DC
  Administered 2013-02-03: 40 mg via INTRAVENOUS
  Filled 2013-02-03 (×2): qty 40

## 2013-02-03 MED ORDER — ROCURONIUM BROMIDE 100 MG/10ML IV SOLN
INTRAVENOUS | Status: DC | PRN
  Administered 2013-02-03: 40 mg via INTRAVENOUS

## 2013-02-03 MED ORDER — GLYCOPYRROLATE 0.2 MG/ML IJ SOLN
INTRAMUSCULAR | Status: DC | PRN
  Administered 2013-02-03: .6 mg via INTRAVENOUS

## 2013-02-03 SURGICAL SUPPLY — 17 items
CLOTH BEACON ORANGE TIMEOUT ST (SAFETY) ×2 IMPLANT
COVER SURGICAL LIGHT HANDLE (MISCELLANEOUS) ×2 IMPLANT
DRAPE LAPAROSCOPIC ABDOMINAL (DRAPES) ×2 IMPLANT
DRAPE UTILITY 15X26 W/TAPE STR (DRAPE) ×4 IMPLANT
GLOVE BIOGEL PI IND STRL 8 (GLOVE) ×1 IMPLANT
GLOVE BIOGEL PI INDICATOR 8 (GLOVE) ×1
GLOVE ECLIPSE 7.5 STRL STRAW (GLOVE) ×2 IMPLANT
GOWN STRL NON-REIN LRG LVL3 (GOWN DISPOSABLE) ×2 IMPLANT
KIT COLOSTOMY ILEOSTOMY 4 (WOUND CARE) ×2 IMPLANT
PACK GENERAL/GYN (CUSTOM PROCEDURE TRAY) ×2 IMPLANT
STAPLER VISISTAT 35W (STAPLE) ×2 IMPLANT
SUT SILK 2 0 SH CR/8 (SUTURE) ×2 IMPLANT
SUT SILK 2 0 TIES 10X30 (SUTURE) ×2 IMPLANT
SUT SILK 3 0 TIES 10X30 (SUTURE) ×2 IMPLANT
SUT VIC AB 3-0 SH 8-18 (SUTURE) ×2 IMPLANT
SYR BULB IRRIGATION 50ML (SYRINGE) ×2 IMPLANT
TOWEL OR 17X26 10 PK STRL BLUE (TOWEL DISPOSABLE) ×4 IMPLANT

## 2013-02-03 NOTE — Op Note (Signed)
OPERATIVE REPORT  DATE OF OPERATION: 02/03/2013  PATIENT:  Vanessa [name]  37 y.o. female  PRE-OPERATIVE DIAGNOSIS:  Prolapsed colostomy  POST-OPERATIVE DIAGNOSIS:  Prolapsed colostomy with obstruction and ischemia  PROCEDURE:  Procedure(s): COLOSTOMY REVISION  SURGEON:  Surgeon(s): Cherylynn Ridges, MD  ASSISTANT: Riebock, PA-C  ANESTHESIA:   general  EBL: <50 ml  BLOOD ADMINISTERED: none  DRAINS: none   SPECIMEN:  Source of Specimen:  resected prolapsed colon.  COUNTS CORRECT:  YES  PROCEDURE DETAILS: The patient was taken to the operating room and placed on the table in the supine position. After an adequate general endotracheal anesthetic was administered, she was prepped and draped in usual sterile manner exposing the prolapsed colostomy on the left side of her abdomen.  After proper time out was performed identifying the patient and the procedure to be performed, we started by using electrocautery to incise into the mucosal surface of the prolapsed colostomy approximately a centimeter to 2 cm from the epidermal/mucosal interface. We used electrocautery to get into the plane between the prolapsed inner portion of the colon into the colostomy. Once we were in that plane we circumferentially dissected away the prolapsed portion of the colon away from the serosal surface of the protruding colon. As we did so circumferentially the blood supply had to be controlled using 2-0 silk ties and hemostat clamps and Kelly clamps. Once we had adequate control we came across the protruding colon with a straight Spring clamp.  We cut across the distal portion of the colon using a #10 blade. Again control of bleeding was done with hemostat clamps and 20 and 3-0 silk ties. The resected portion of colon was sent off as a specimen. We then released a spring clamp matured the colostomy using interrupted simple stitches of 3-0 Vicryl undyed suture. This was done circumferentially. All counts were  correct.  The patient also had a percutaneous drain in the left side which was resutured with 2-0 nylon suture. A colostomy device appliance was applied to the newly matured colostomy. All needle counts, sponge counts, and instrument counts were correct.  PATIENT DISPOSITION:  PACU - hemodynamically stable.   Cherylynn Ridges 5/14/20142:47 PM

## 2013-02-03 NOTE — H&P (Signed)
Vanessa [name] is an 37 y.o. female.   Chief Complaint: Abdominal pain at colostomy site HPI: 2 days of abdominal pain and prolapse of her colostomy.  Did not come in right away, colostomy has still been functional.  Cannot reduce in the ED even with osmotic edema reduction attempt with dextrose.  Will need to be admitted for colostomy revision.  Past Medical History  Diagnosis Date  . History of blood transfusion     "I had ~ 7 in 01/2012"  . Anemia   . Cervical cancer ~ 2011    cervical  . Radiation Nov.18,2011-Jan.23,2012    cervix  . Hypertension     no treatment necessary for BP  since July 2013    Past Surgical History  Procedure Laterality Date  . Radiation implants  ~ 2011    "for cervical cancer"  . Colonoscopy  01/30/2012    Procedure: COLONOSCOPY;  Surgeon: Beverley Fiedler, MD;  Location: [facility] ENDOSCOPY;  Service: Gastroenterology;  Laterality: N/A;  . Colonoscopy  01/30/2012    Procedure: COLONOSCOPY;  Surgeon: Beverley Fiedler, MD;  Location: [facility] OR;  Service: Gastroenterology;  Laterality: N/A;  . Colostomy revision  02/04/2012    Procedure: COLON RESECTION SIGMOID;  Surgeon: Cherylynn Ridges, MD;  Location: [facility] OR;  Service: General;  Laterality: N/A;  . Cholecystectomy  2006  . Laparotomy  08/11/2012    Procedure: EXPLORATORY LAPAROTOMY;  Surgeon: Cherylynn Ridges, MD;  Location: [facility] OR;  Service: General;  Laterality: N/A;  . Laparoscopic lysis of adhesions  08/11/2012    Procedure: LAPAROSCOPIC LYSIS OF ADHESIONS;  Surgeon: Cherylynn Ridges, MD;  Location: [facility] OR;  Service: General;  Laterality: N/A;  . Bowel resection  08/11/2012    Procedure: SMALL BOWEL RESECTION;  Surgeon: Cherylynn Ridges, MD;  Location: [facility] OR;  Service: General;  Laterality: N/A;  . Colostomy  08/11/2012    Procedure: COLOSTOMY;  Surgeon: Cherylynn Ridges, MD;  Location: [facility] OR;  Service: General;  Laterality: N/A;    No family history on file. Social History:  reports that she quit smoking about a year ago. Her  smoking use included Cigarettes. She has a .25 pack-year smoking history. She has never used smokeless tobacco. She reports that  drinks alcohol. She reports that she uses illicit drugs (Marijuana).  Allergies:  Allergies  Allergen Reactions  . Penicillins Other (See Comments)    "anything w/penicillin in it makes me bleed"  . Labetalol Hcl Itching and Other (See Comments)    Bumps to bilateral arms.  . Morphine And Related Hives     (Not in a hospital admission)  Results for orders placed during the hospital encounter of 02/03/13 (from the past 48 hour(s))  CBC     Status: Abnormal   Collection Time    02/03/13  1:54 AM      Result Value Range   WBC 7.0  4.0 - 10.5 K/uL   RBC 3.29 (*) 3.87 - 5.11 MIL/uL   Hemoglobin 9.9 (*) 12.0 - 15.0 g/dL   HCT 46.9 (*) 62.9 - 52.8 %   MCV 89.1  78.0 - 100.0 fL   MCH 30.1  26.0 - 34.0 pg   MCHC 33.8  30.0 - 36.0 g/dL   RDW 41.3  24.4 - 01.0 %   Platelets 471 (*) 150 - 400 K/uL  COMPREHENSIVE METABOLIC PANEL     Status: Abnormal   Collection Time    02/03/13  1:54 AM  Result Value Range   Sodium 138  135 - 145 mEq/L   Potassium 3.0 (*) 3.5 - 5.1 mEq/L   Chloride 103  96 - 112 mEq/L   CO2 27  19 - 32 mEq/L   Glucose, Bld 98  70 - 99 mg/dL   BUN 8  6 - 23 mg/dL   Creatinine, Ser 0.45  0.50 - 1.10 mg/dL   Calcium 9.5  8.4 - 40.9 mg/dL   Total Protein 6.4  6.0 - 8.3 g/dL   Albumin 2.7 (*) 3.5 - 5.2 g/dL   AST 15  0 - 37 U/L   ALT 13  0 - 35 U/L   Alkaline Phosphatase 116  39 - 117 U/L   Total Bilirubin 0.2 (*) 0.3 - 1.2 mg/dL   GFR calc non Af Amer >90  >90 mL/min   GFR calc Af Amer >90  >90 mL/min   Comment:            The eGFR has been calculated     using the CKD EPI equation.     This calculation has not been     validated in all clinical     situations.     eGFR's persistently     <90 mL/min signify     possible Chronic Kidney Disease.  URINALYSIS, ROUTINE W REFLEX MICROSCOPIC     Status: Abnormal   Collection Time     02/03/13  6:19 AM      Result Value Range   Color, Urine AMBER (*) YELLOW   Comment: BIOCHEMICALS MAY BE AFFECTED BY COLOR   APPearance CLOUDY (*) CLEAR   Specific Gravity, Urine 1.022  1.005 - 1.030   pH 6.0  5.0 - 8.0   Glucose, UA NEGATIVE  NEGATIVE mg/dL   Hgb urine dipstick MODERATE (*) NEGATIVE   Bilirubin Urine SMALL (*) NEGATIVE   Ketones, ur NEGATIVE  NEGATIVE mg/dL   Protein, ur 811 (*) NEGATIVE mg/dL   Urobilinogen, UA 1.0  0.0 - 1.0 mg/dL   Nitrite NEGATIVE  NEGATIVE   Leukocytes, UA NEGATIVE  NEGATIVE  URINE MICROSCOPIC-ADD ON     Status: Abnormal   Collection Time    02/03/13  6:19 AM      Result Value Range   Squamous Epithelial / LPF RARE  RARE   WBC, UA 0-2  <3 WBC/hpf   RBC / HPF 11-20  <3 RBC/hpf   Bacteria, UA RARE  RARE   Crystals CA OXALATE CRYSTALS (*) NEGATIVE   No results found.  ROS  Blood pressure 144/96, pulse 81, temperature 97.7 F (36.5 C), temperature source Oral, SpO2 99.00%. Physical Exam  Constitutional: She is oriented to person, place, and time. She appears well-developed and well-nourished.  Odor persistent in room from drain  HENT:  Head: Normocephalic and atraumatic.  Eyes: Conjunctivae are normal. Pupils are equal, round, and reactive to light.  Neck: Normal range of motion.  Cardiovascular: Normal rate, regular rhythm, S1 normal and intact distal pulses.  Exam reveals gallop, S3 and S4.   No murmur heard. Respiratory: Effort normal and breath sounds normal.  GI: Soft. Bowel sounds are normal. There is tenderness in the periumbilical area and left upper quadrant.    Musculoskeletal: Normal range of motion.       Right lower leg: She exhibits swelling. She exhibits no tenderness.  Neurological: She is alert and oriented to person, place, and time.  Skin: Skin is warm and dry.  Psychiatric: She has a  normal mood and affect. Her behavior is normal. Judgment and thought content normal.     Assessment/Plan Prolapsed ischemic  colostomy with pain.  Will need admission and revision ASAP. Invanz for OR.  Cherylynn Ridges 02/03/2013, 7:59 AM

## 2013-02-03 NOTE — Anesthesia Procedure Notes (Signed)
Procedure Name: Intubation Date/Time: 02/03/2013 1:42 PM Performed by: Rogelia Boga Pre-anesthesia Checklist: Patient identified, Emergency Drugs available, Suction available, Patient being monitored and Timeout performed Patient Re-evaluated:Patient Re-evaluated prior to inductionOxygen Delivery Method: Circle system utilized Preoxygenation: Pre-oxygenation with 100% oxygen Intubation Type: IV induction Ventilation: Mask ventilation without difficulty Laryngoscope Size: Mac and 4 Grade View: Grade II Tube type: Oral Tube size: 7.5 mm Number of attempts: 1 Airway Equipment and Method: Stylet Secured at: 21 cm Tube secured with: Tape Dental Injury: Teeth and Oropharynx as per pre-operative assessment

## 2013-02-03 NOTE — ED Notes (Signed)
Pt. Just arrived to Pod C 29.   Pt. Is alert and oriented X4   Pt. Understands that she will be having surgery.  Consent signed. NPO maintained.   Pt. Is changing her colostomy bag.

## 2013-02-03 NOTE — ED Notes (Signed)
Patient given urinal to drain her bag.   Patient advised she did not need help, she is able to drain.

## 2013-02-03 NOTE — ED Notes (Signed)
Pt reports pain at colostomy with diarrhea and swelling since yesterday. Pt also reports bilateral leg swelling.

## 2013-02-03 NOTE — Progress Notes (Signed)
Unit CM UR Completed by [facility] ED CM  W. Simya Tercero RN  

## 2013-02-03 NOTE — ED Notes (Signed)
Pt reports having a bowel obstruction and having a colostomy placed approximately a year ago. Reports increased swelling, redness, and pain at colostomy site and stoma since yesterday. Reports multiple diarrhea episodes and foul smell coming form the colostomy bag. Reports taking 1 percocet 10mg  tablet around 11pm last night with no relief. Pt reports stoma pain 10/10 at this time. Denies nausea and vomiting at this time.

## 2013-02-03 NOTE — Progress Notes (Signed)
Spoke with dr singer regarding pt bp=148/117 orders obtained and carried out

## 2013-02-03 NOTE — ED Notes (Signed)
Colostomy  Supplies ordered from material mx pt npo for or today

## 2013-02-03 NOTE — Transfer of Care (Signed)
Immediate Anesthesia Transfer of Care Note  Patient: Vanessa [name]  Procedure(s) Performed: Procedure(s): COLOSTOMY REVISION (N/A)  Patient Location: PACU  Anesthesia Type:General  Level of Consciousness: awake, alert , oriented and patient cooperative  Airway & Oxygen Therapy: Patient Spontanous Breathing and Patient connected to nasal cannula oxygen  Post-op Assessment: Report given to PACU RN, Post -op Vital signs reviewed and stable and Patient moving all extremities X 4  Post vital signs: Reviewed and stable  Complications: No apparent anesthesia complications

## 2013-02-03 NOTE — ED Provider Notes (Signed)
History     CSN: [id number]  Arrival date & time 02/03/13  0023   First MD Initiated Contact with Patient 02/03/13 [phone]      Chief Complaint  Patient presents with  . Abdominal Pain  . Diarrhea    (Consider location/radiation/quality/duration/timing/severity/associated sxs/prior treatment) HPI  37 year old female presents the emergency department with chief complaint of abdominal pain and diarrhea.  She is status post colostomy placement for small bowel obstruction approximately one year ago.  The patient was seen 2 days ago and 2 days prior to that for severe abdominal pain and high output from her colostomy.  Patient states that over the past few day she has had increasing swelling, ulceration and pain at her ostomy site.  She also report high output from her ostomy.  She states that her pain is severe and unrelieved at home with Percocet 10 mg which he has been on chronically since her colostomy one year ago.  Patient denies any blood or dark tarry discharge from the ostomy.  Patient denies any fevers, chills, nausea or vomiting.  He denies any myalgias or arthralgias.  He should seen 2 days ago with the same as well as hypoproteinemia.  She is here again and appears to be hypokalemic with a potassium of 3.0.  Urine was questionable for infection.  Will repeat a urinalysis today.  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  She is followed by [facility] surgery and Dr. [name] specifically.  Past Medical History  Diagnosis Date  . History of blood transfusion     "I had ~ 7 in 01/2012"  . Anemia   . Cervical cancer ~ 2011    cervical  . Radiation Nov.18,2011-Jan.23,2012    cervix  . Hypertension     no treatment necessary for BP  since July 2013    Past Surgical History  Procedure Laterality Date  . Radiation implants  ~ 2011    "for cervical cancer"  . Colonoscopy  01/30/2012    Procedure: COLONOSCOPY;  Surgeon: Beverley Fiedler, MD;   Location: [facility] ENDOSCOPY;  Service: Gastroenterology;  Laterality: N/A;  . Colonoscopy  01/30/2012    Procedure: COLONOSCOPY;  Surgeon: Beverley Fiedler, MD;  Location: [facility] OR;  Service: Gastroenterology;  Laterality: N/A;  . Colostomy revision  02/04/2012    Procedure: COLON RESECTION SIGMOID;  Surgeon: Cherylynn Ridges, MD;  Location: [facility] OR;  Service: General;  Laterality: N/A;  . Cholecystectomy  2006  . Laparotomy  08/11/2012    Procedure: EXPLORATORY LAPAROTOMY;  Surgeon: Cherylynn Ridges, MD;  Location: [facility] OR;  Service: General;  Laterality: N/A;  . Laparoscopic lysis of adhesions  08/11/2012    Procedure: LAPAROSCOPIC LYSIS OF ADHESIONS;  Surgeon: Cherylynn Ridges, MD;  Location: [facility] OR;  Service: General;  Laterality: N/A;  . Bowel resection  08/11/2012    Procedure: SMALL BOWEL RESECTION;  Surgeon: Cherylynn Ridges, MD;  Location: [facility] OR;  Service: General;  Laterality: N/A;  . Colostomy  08/11/2012    Procedure: COLOSTOMY;  Surgeon: Cherylynn Ridges, MD;  Location: [facility] OR;  Service: General;  Laterality: N/A;    No family history on file.  History  Substance Use Topics  . Smoking status: Former Smoker -- 0.50 packs/day for .5 years    Types: Cigarettes    Quit date: 01/26/2012  . Smokeless tobacco: Never Used  . Alcohol Use: 0.0 oz/week     Comment: 03/16/12 "haven't drank in ~  2-3 years"    OB History   Grav Para Term Preterm Abortions TAB SAB Ect Mult Living   10 8              Review of Systems Ten systems reviewed and are negative for acute change, except as noted in the HPI.   Allergies  Penicillins; Labetalol hcl; and Morphine and related  Home Medications   Current Outpatient Rx  Name  Route  Sig  Dispense  Refill  . feeding supplement (ENSURE COMPLETE) LIQD   Oral   Take 237 mLs by mouth 2 (two) times daily between meals.         . magnesium oxide (MAG-OX) 400 (241.3 MG) MG tablet   Oral   Take 1 tablet (400 mg total) by mouth daily.   30 tablet   1   . metoCLOPramide  (REGLAN) 10 MG tablet   Oral   Take 1 tablet (10 mg total) by mouth 4 (four) times daily as needed (nausea).   30 tablet   0   . ondansetron (ZOFRAN ODT) 8 MG disintegrating tablet   Oral   Take 1 tablet (8 mg total) by mouth every 8 (eight) hours as needed for nausea.   20 tablet   0   . oxyCODONE-acetaminophen (PERCOCET) 10-325 MG per tablet   Oral   Take 1 tablet by mouth every 6 (six) hours as needed for pain.   45 tablet   0   . potassium chloride SA (K-DUR,KLOR-CON) 20 MEQ tablet   Oral   Take 1 tablet (20 mEq total) by mouth 3 (three) times daily.   90 tablet   0     BP 164/105  Pulse 96  Temp(Src) 97.7 F (36.5 C) (Oral)  SpO2 98%  Physical Exam Physical Exam  Nursing note and vitals reviewed. Constitutional: She is oriented to person, place, and time. She appears well-developed and well-nourished.  Patient is tearful and crying loudly.  She asks for help due to her pain.  She appears extremely uncomfortable. HENT:  Head: Normocephalic and atraumatic.  Eyes: Conjunctivae normal and EOM are normal. Pupils are equal, round, and reactive to light. No scleral icterus.  Neck: Normal range of motion.  Cardiovascular: Normal rate, regular rhythm and normal heart sounds.  Exam reveals no gallop and no friction rub.   No murmur heard. Pulmonary/Chest: Effort normal and breath sounds normal. No respiratory distress.  Abdominal: Soft. Bowel sounds are normal.  No distention.  Exquisitely tender to palpation.  Colostomy site appears swollen with with areas of ulceration on on surrounding tissue.  It appears quite swollen. Neurological: She is alert and oriented to person, place, and time.  Skin: Skin is warm and dry. She is not diaphoretic.    ED Course  Procedures (including critical care time)  Labs Reviewed  CBC - Abnormal; Notable for the following:    RBC 3.29 (*)    Hemoglobin 9.9 (*)    HCT 29.3 (*)    Platelets 471 (*)    All other components within normal  limits  COMPREHENSIVE METABOLIC PANEL - Abnormal; Notable for the following:    Potassium 3.0 (*)    Albumin 2.7 (*)    Total Bilirubin 0.2 (*)    All other components within normal limits  URINALYSIS, ROUTINE W REFLEX MICROSCOPIC   No results found.   1. Colostomy complication       MDM  6:31 AM Filed Vitals:   02/03/13 0600  BP:  164/105  Pulse: 96  Temp:    Patient with severe pain and exquisite tenderness.  Patient may have infection and they ostomy site. I discussed the case with Dr. Quentin Ore.  Will call surgery for consult.  Plan to repeat urine today.  Analgesia offered to the patient that.  We'll manage her pain.  Repleat potassium.      7:16 AM I have spoken with Dr. Janee Morn. He will have Dr. [name] come and take a look at her ostomy site.    8:20 AM Filed Vitals:   02/03/13 0645 02/03/13 0700 02/03/13 0715 02/03/13 0730  BP: 144/106 153/111 147/106 144/96  Pulse: 82 86 83 81  Temp:      TempSrc:      SpO2: 100% 96% 97% 99%    Patient with prolapse and restriction of her ostomy. She will be admitted for ostomy revision. Re-dosed pain medication after attempted reduction and patient is more comfortable.  The patient appears reasonably stabilized for admission considering the current resources, flow, and capabilities available in the ED at this time, and I doubt any other [facility] requiring further screening and/or treatment in the ED prior to admission.   Arthor Captain, PA-C 02/03/13 [phone]

## 2013-02-03 NOTE — Progress Notes (Signed)
Dr fitzgerald here aware of pts bp 145/114

## 2013-02-03 NOTE — Preoperative (Signed)
Beta Blockers   Reason not to administer Beta Blockers:Not Applicable 

## 2013-02-03 NOTE — Anesthesia Preprocedure Evaluation (Signed)
Anesthesia Evaluation  Patient identified by MRN, date of birth, ID band Patient awake    Reviewed: Allergy & Precautions, H&P , NPO status , Patient's Chart, lab work & pertinent test results  Airway Mallampati: I TM Distance: >3 FB Neck ROM: Full    Dental no notable dental hx. (+) Teeth Intact, Chipped and Dental Advisory Given   Pulmonary neg pulmonary ROS,  breath sounds clear to auscultation  Pulmonary exam normal       Cardiovascular hypertension, Rhythm:Regular Rate:Normal     Neuro/Psych negative neurological ROS  negative psych ROS   GI/Hepatic negative GI ROS, Neg liver ROS,   Endo/Other  negative endocrine ROS  Renal/GU negative Renal ROS  negative genitourinary   Musculoskeletal   Abdominal   Peds  Hematology negative hematology ROS (+) anemia ,   Anesthesia Other Findings   Reproductive/Obstetrics negative OB ROS                           Anesthesia Physical Anesthesia Plan  ASA: III  Anesthesia Plan: General   Post-op Pain Management:    Induction: Intravenous  Airway Management Planned: Oral ETT  Additional Equipment:   Intra-op Plan:   Post-operative Plan: Extubation in OR  Informed Consent: I have reviewed the patients History and Physical, chart, labs and discussed the procedure including the risks, benefits and alternatives for the proposed anesthesia with the patient or authorized representative who has indicated his/her understanding and acceptance.   Dental advisory given  Plan Discussed with: CRNA  Anesthesia Plan Comments:         Anesthesia Quick Evaluation

## 2013-02-04 MED ORDER — PANTOPRAZOLE SODIUM 40 MG PO TBEC
40.0000 mg | DELAYED_RELEASE_TABLET | Freq: Every day | ORAL | Status: DC
  Administered 2013-02-04 – 2013-02-18 (×15): 40 mg via ORAL
  Filled 2013-02-04 (×14): qty 1

## 2013-02-04 NOTE — Anesthesia Postprocedure Evaluation (Signed)
  Anesthesia Post-op Note  Patient: Vanessa [name]  Procedure(s) Performed: Procedure(s): COLOSTOMY REVISION (N/A)  Patient Location: PACU  Anesthesia Type:General  Level of Consciousness: awake and alert   Airway and Oxygen Therapy: Patient Spontanous Breathing  Post-op Pain: mild  Post-op Assessment: Post-op Vital signs reviewed, Patient's Cardiovascular Status Stable, Respiratory Function Stable, Patent Airway and No signs of Nausea or vomiting  Post-op Vital Signs: Reviewed and stable  Complications: No apparent anesthesia complications

## 2013-02-04 NOTE — Consult Note (Signed)
WOC consult Note Reason for Consult: Consult requested for stoma prolapse.  Pt is familiar to [facility] nurse from multiple previous admissions. She is very knowledgeable and independent with pouch application and routines.  Stoma opening usually 2 inches when cutting pouch until this week when it began to prolapse.  She had her third ostomy surgery yesterday for stoma prolapse (she has had a previous revision in addition to the original stoma surgery) and stoma has prolapsed again at this time. Stoma is very swollen and painful.  Current pouch is leaking behind barrier.  Will need to use largest pouching system available to allow for adequate opening to be cut.  Sprinkled sugar on stoma to attempt to retract but no change in size noted. Stoma type/location: Colostomy to left lower quad Stomal assessment/size:  2 and 3/4 inches Peristomal assessment: Skin is intact surrounding stoma Output: Small amount of blood noted in bag, small brown liquid stool in pouch Ostomy pouching: 2pc large barrier pouch.  Supplies ordered to bedside for patient use. Cammie Mcgee MSN, RN, CWOCN, [place], CNS [phone] .

## 2013-02-04 NOTE — ED Provider Notes (Signed)
Medical screening examination/treatment/procedure(s) were conducted as a shared visit with non-physician practitioner(s) and myself.  I personally evaluated the patient during the encounter.  Pt with colostomy, complicated surgical course, scheduled for surgery later this month due to complications.  She has had 1 day h/o swelling of colostomy site, and breakdown of skin/mucosa of the site.  Pt with prolapse of the colostomy with some sloughing of skin.  Pt to be seen by surgery in the ED.  Expect will need either manual reduction or possible surgical reduction of the prolapse  Olivia Mackie, MD 02/04/13 0700

## 2013-02-04 NOTE — Progress Notes (Signed)
GS Progress Note Subjective: Patient still having pain at the site of previous prolapse, and the stoma is starting to prolapse again.  Not as big as before.  Will get the Ostomy nurse to see patient today.  The stoma was flat at the end of surgery yesterday,  Objective: Vital signs in last 24 hours: Temp:  [97.4 F (36.3 C)-98.5 F (36.9 C)] 98.5 F (36.9 C) (05/15 0619) Pulse Rate:  [64-82] 78 (05/15 0619) Resp:  [12-19] 18 (05/15 0619) BP: (129-153)/(92-117) 132/97 mmHg (05/15 0619) SpO2:  [93 %-100 %] 100 % (05/15 0619) Weight:  [59.875 kg (132 lb)] 59.875 kg (132 lb) (05/14 1720) Last BM Date: 02/02/13  Intake/Output from previous day: 05/14 0701 - 05/15 0700 In: 1598 [I.V.:1598] Out: 660 [Urine:650; Drains:10] Intake/Output this shift:    Lungs: clear  Abd: Stoma is edematous and early prolapse.  Extremities: No DVT signs or symptoms  Neuro: Intact  Lab Results: CBC   Recent Labs  02/03/13 0154  WBC 7.0  HGB 9.9*  HCT 29.3*  PLT 471*   BMET  Recent Labs  02/03/13 0154  NA 138  K 3.0*  CL 103  CO2 27  GLUCOSE 98  BUN 8  CREATININE 0.64  CALCIUM 9.5   PT/INR No results found for this basename: LABPROT, INR,  in the last 72 hours ABG No results found for this basename: PHART, PCO2, PO2, HCO3,  in the last 72 hours  Studies/Results: No results found.  Anti-infectives: Anti-infectives   Start     Dose/Rate Route Frequency Ordered Stop   02/03/13 1315  ertapenem (INVANZ) 1 g in sodium chloride 0.9 % 50 mL IVPB  Status:  Discontinued     1 g 100 mL/hr over 30 Minutes Intravenous  Once 02/03/13 1312 02/03/13 1650   02/03/13 0800  ertapenem (INVANZ) 1 g in sodium chloride 0.9 % 50 mL IVPB  Status:  Discontinued     1 g 100 mL/hr over 30 Minutes Intravenous Every 24 hours 02/03/13 0759 02/03/13 1702      Assessment/Plan: s/p Procedure(s): COLOSTOMY REVISION Advance diet Get Ostomy nurse to see ASAP.  LOS: 1 day    Marta Lamas. [name],  MD, FACS [phone] [facility] 02/04/2013

## 2013-02-05 ENCOUNTER — Encounter (HOSPITAL_COMMUNITY): Payer: Self-pay | Admitting: General Surgery

## 2013-02-05 NOTE — Consult Note (Signed)
WOC ostomy consult: 2 piece large pouch remains intact, stoma is pink and moist, appears more swollen in size than previous day.  Small amount of yellowish drainage noted to bag, no stool.  Pt continues to have pain at site.  Some supplies at bedside, more supplies ordered for patient or bedside nurse usage.  Norva Karvonen RN, MSN Student Cammie Mcgee MSN, RN, CWOCN, [place], CNS [phone]

## 2013-02-05 NOTE — Progress Notes (Signed)
GS Progress Note Subjective: The stoma is massively edematous and protruding, but does nto seem like true prolapse like before.  Still very tender.  Will try to get advice from colorectal colleague.  Objective: Vital signs in last 24 hours: Temp:  [97.4 F (36.3 C)-98.5 F (36.9 C)] 98.3 F (36.8 C) (05/16 0625) Pulse Rate:  [72-87] 77 (05/16 0625) Resp:  [16-18] 18 (05/16 0625) BP: (127-139)/(89-97) 139/96 mmHg (05/16 0625) SpO2:  [98 %-100 %] 100 % (05/16 0625) Last BM Date: 02/02/13  Intake/Output from previous day: 05/15 0701 - 05/16 0700 In: 311 [I.V.:311] Out: 965 [Urine:950; Drains:15] Intake/Output this shift:    Lungs: Clear.  Abd: Overall benign.  Stoma is very edematous, protruding about1.5 inches.  Only leaking small amount of loose stool  Extremities: No changes  Neuro: intact  Lab Results: CBC   Recent Labs  02/03/13 0154  WBC 7.0  HGB 9.9*  HCT 29.3*  PLT 471*   BMET  Recent Labs  02/03/13 0154  NA 138  K 3.0*  CL 103  CO2 27  GLUCOSE 98  BUN 8  CREATININE 0.64  CALCIUM 9.5   PT/INR No results found for this basename: LABPROT, INR,  in the last 72 hours ABG No results found for this basename: PHART, PCO2, PO2, HCO3,  in the last 72 hours  Studies/Results: No results found.  Anti-infectives: Anti-infectives   Start     Dose/Rate Route Frequency Ordered Stop   02/03/13 1315  ertapenem (INVANZ) 1 g in sodium chloride 0.9 % 50 mL IVPB  Status:  Discontinued     1 g 100 mL/hr over 30 Minutes Intravenous  Once 02/03/13 1312 02/03/13 1650   02/03/13 0800  ertapenem (INVANZ) 1 g in sodium chloride 0.9 % 50 mL IVPB  Status:  Discontinued     1 g 100 mL/hr over 30 Minutes Intravenous Every 24 hours 02/03/13 0759 02/03/13 1702      Assessment/Plan: s/p Procedure(s): COLOSTOMY REVISION Concerned about edematous mild prolapse, may need revision again.   Will contact my colorectal partner ans see what whe may have to say about why this  recurred so quickly.  LOS: 2 days    Marta Lamas. [name], MD, FACS [phone] [facility] 02/05/2013

## 2013-02-06 NOTE — Progress Notes (Signed)
3 Days Post-Op  Subjective: Doing better, tolerating diet, still needing IV pain meds  Objective: Vital signs in last 24 hours: Temp:  [98.1 F (36.7 C)-98.2 F (36.8 C)] 98.2 F (36.8 C) (05/17 0524) Pulse Rate:  [78-87] 78 (05/17 0524) Resp:  [16] 16 (05/17 0524) BP: (135-144)/(99-111) 138/103 mmHg (05/17 0524) SpO2:  [92 %-100 %] 99 % (05/17 0524) Last BM Date: 02/05/13  Intake/Output from previous day: 05/16 0701 - 05/17 0700 In: 1217.3 [P.O.:560; I.V.:657.3] Out: 1010 [Urine:900; Drains:35; Stool:75] Intake/Output this shift:    General appearance: alert and cooperative Resp: clear to auscultation bilaterally Cardio: regular rate and rhythm GI: stoma pink with only mild prolapse, cloudy fluid output, abdomen soft in general Ext: calves soft Lab Results:  No results found for this basename: WBC, HGB, HCT, PLT,  in the last 72 hours BMET No results found for this basename: NA, K, CL, CO2, GLUCOSE, BUN, CREATININE, CALCIUM,  in the last 72 hours PT/INR No results found for this basename: LABPROT, INR,  in the last 72 hours ABG No results found for this basename: PHART, PCO2, PO2, HCO3,  in the last 72 hours  Studies/Results: No results found.  Anti-infectives: Anti-infectives   Start     Dose/Rate Route Frequency Ordered Stop   02/03/13 1315  ertapenem (INVANZ) 1 g in sodium chloride 0.9 % 50 mL IVPB  Status:  Discontinued     1 g 100 mL/hr over 30 Minutes Intravenous  Once 02/03/13 1312 02/03/13 1650   02/03/13 0800  ertapenem (INVANZ) 1 g in sodium chloride 0.9 % 50 mL IVPB  Status:  Discontinued     1 g 100 mL/hr over 30 Minutes Intravenous Every 24 hours 02/03/13 0759 02/03/13 1702      Assessment/Plan: s/p Procedure(s): COLOSTOMY REVISION (N/A)  LOS: 3 days  Colostomy improved Possibly home tomorrow if colostomy output good and pain controlled Other surgery later this month by Dr. Rolly Pancake E 02/06/2013

## 2013-02-07 LAB — CBC
MCV: 89 fL (ref 78.0–100.0)
Platelets: 546 10*3/uL — ABNORMAL HIGH (ref 150–400)
RBC: 3.54 MIL/uL — ABNORMAL LOW (ref 3.87–5.11)
RDW: 15.1 % (ref 11.5–15.5)
WBC: 9 10*3/uL (ref 4.0–10.5)

## 2013-02-07 LAB — URINALYSIS, ROUTINE W REFLEX MICROSCOPIC
Bilirubin Urine: NEGATIVE
Protein, ur: 100 mg/dL — AB
Specific Gravity, Urine: 1.006 (ref 1.005–1.030)
pH: 7 (ref 5.0–8.0)

## 2013-02-07 LAB — URINE MICROSCOPIC-ADD ON

## 2013-02-07 MED ORDER — TRAMADOL HCL 50 MG PO TABS
50.0000 mg | ORAL_TABLET | Freq: Four times a day (QID) | ORAL | Status: DC
  Administered 2013-02-07 – 2013-02-19 (×38): 50 mg via ORAL
  Filled 2013-02-07 (×63): qty 1

## 2013-02-07 MED ORDER — LISINOPRIL 5 MG PO TABS
5.0000 mg | ORAL_TABLET | Freq: Every day | ORAL | Status: DC
  Administered 2013-02-07 – 2013-02-09 (×3): 5 mg via ORAL
  Filled 2013-02-07 (×3): qty 1

## 2013-02-07 MED ORDER — OXYCODONE HCL 5 MG PO TABS
5.0000 mg | ORAL_TABLET | ORAL | Status: DC | PRN
  Administered 2013-02-08: 15 mg via ORAL
  Administered 2013-02-09: 10 mg via ORAL
  Administered 2013-02-09: 15 mg via ORAL
  Administered 2013-02-09: 10 mg via ORAL
  Administered 2013-02-10: 15 mg via ORAL
  Administered 2013-02-11 – 2013-02-13 (×5): 10 mg via ORAL
  Administered 2013-02-13 – 2013-02-18 (×17): 15 mg via ORAL
  Administered 2013-02-18 (×2): 10 mg via ORAL
  Administered 2013-02-19: 15 mg via ORAL
  Filled 2013-02-07: qty 3
  Filled 2013-02-07 (×2): qty 2
  Filled 2013-02-07 (×5): qty 3
  Filled 2013-02-07: qty 2
  Filled 2013-02-07: qty 3
  Filled 2013-02-07 (×2): qty 2
  Filled 2013-02-07 (×5): qty 3
  Filled 2013-02-07: qty 2
  Filled 2013-02-07 (×4): qty 3
  Filled 2013-02-07: qty 2
  Filled 2013-02-07: qty 3
  Filled 2013-02-07: qty 2
  Filled 2013-02-07: qty 3
  Filled 2013-02-07: qty 2
  Filled 2013-02-07 (×2): qty 3
  Filled 2013-02-07: qty 2
  Filled 2013-02-07: qty 3

## 2013-02-07 MED ORDER — HYDRALAZINE HCL 20 MG/ML IJ SOLN
10.0000 mg | Freq: Once | INTRAMUSCULAR | Status: AC
  Administered 2013-02-07: 10 mg via INTRAVENOUS
  Filled 2013-02-07: qty 1

## 2013-02-07 NOTE — Progress Notes (Signed)
4 Days Post-Op  Subjective: Still quite sore at ostomy site, mild nausea but tolerating some PO  Objective: Vital signs in last 24 hours: Temp:  [98.2 F (36.8 C)-98.6 F (37 C)] 98.2 F (36.8 C) (05/18 0551) Pulse Rate:  [81-92] 92 (05/18 0551) Resp:  [16-18] 18 (05/18 0551) BP: (129-149)/(93-115) 149/115 mmHg (05/18 0551) SpO2:  [99 %-100 %] 99 % (05/18 0551) Last BM Date: 02/05/13  Intake/Output from previous day: 05/17 0701 - 05/18 0700 In: -  Out: 1025 [Urine:1000; Drains:25] Intake/Output this shift:    General appearance: alert and cooperative Resp: clear to auscultation bilaterally Cardio: regular rate and rhythm GI: soft, stoma with some edema but liquid and solid stool output  Lab Results:  No results found for this basename: WBC, HGB, HCT, PLT,  in the last 72 hours BMET No results found for this basename: NA, K, CL, CO2, GLUCOSE, BUN, CREATININE, CALCIUM,  in the last 72 hours PT/INR No results found for this basename: LABPROT, INR,  in the last 72 hours ABG No results found for this basename: PHART, PCO2, PO2, HCO3,  in the last 72 hours  Studies/Results: No results found.  Anti-infectives: Anti-infectives   Start     Dose/Rate Route Frequency Ordered Stop   02/03/13 1315  ertapenem (INVANZ) 1 g in sodium chloride 0.9 % 50 mL IVPB  Status:  Discontinued     1 g 100 mL/hr over 30 Minutes Intravenous  Once 02/03/13 1312 02/03/13 1650   02/03/13 0800  ertapenem (INVANZ) 1 g in sodium chloride 0.9 % 50 mL IVPB  Status:  Discontinued     1 g 100 mL/hr over 30 Minutes Intravenous Every 24 hours 02/03/13 0759 02/03/13 1702      Assessment/Plan: s/p Procedure(s): COLOSTOMY REVISION (N/A) Colostomy improved Adjust pain meds Possibly home tomorrow if colostomy output good and pain controlled Other surgery later this month by Dr. [name]  LOS: 4 days    [name] 02/07/2013

## 2013-02-07 NOTE — Progress Notes (Signed)
Pt. BP 154/104. MD notified. MD request to place OTO for hydralazine 10 mg. Order placed and med given. BP rechecked. BP 151/108. Pt. Also complained of blood and clots in urine. Clots and blood visible. Pt. Stated to have not had a cycle in two years.MD notified. MD request to place order for CBC and UA. Orders placed and obtained. MD also ordered Lisinopril. Lisinopril given. Will pass on to oncoming nurse and will continue to monitor pt.

## 2013-02-08 ENCOUNTER — Inpatient Hospital Stay (HOSPITAL_COMMUNITY): Payer: Medicaid Other

## 2013-02-08 DIAGNOSIS — D473 Essential (hemorrhagic) thrombocythemia: Secondary | ICD-10-CM | POA: Diagnosis present

## 2013-02-08 DIAGNOSIS — I10 Essential (primary) hypertension: Secondary | ICD-10-CM

## 2013-02-08 DIAGNOSIS — R319 Hematuria, unspecified: Secondary | ICD-10-CM

## 2013-02-08 DIAGNOSIS — E8809 Other disorders of plasma-protein metabolism, not elsewhere classified: Secondary | ICD-10-CM

## 2013-02-08 LAB — BASIC METABOLIC PANEL
Chloride: 107 mEq/L (ref 96–112)
GFR calc Af Amer: >90 mL/min (ref >90)
GFR calc non Af Amer: 89 mL/min — ABNORMAL LOW (ref >90)
Potassium: 4.1 mEq/L (ref 3.5–5.1)
Sodium: 143 mEq/L (ref 135–145)

## 2013-02-08 LAB — GLUCOSE, CAPILLARY: Glucose-Capillary: 95 mg/dL (ref 70–99)

## 2013-02-08 LAB — MAGNESIUM: Magnesium: 1.9 mg/dL (ref 1.5–2.5)

## 2013-02-08 LAB — URINE CULTURE: Colony Count: >=100000

## 2013-02-08 LAB — PHOSPHORUS: Phosphorus: 3.4 mg/dL (ref 2.3–4.6)

## 2013-02-08 MED ORDER — WHITE PETROLATUM GEL
Status: AC
  Filled 2013-02-08: qty 5

## 2013-02-08 MED ORDER — INSULIN ASPART 100 UNIT/ML ~~LOC~~ SOLN: 0.0000 [IU] | SUBCUTANEOUS | Status: DC

## 2013-02-08 MED ORDER — AMLODIPINE BESYLATE 2.5 MG PO TABS
2.5000 mg | ORAL_TABLET | Freq: Every day | ORAL | Status: DC
  Administered 2013-02-08 – 2013-02-11 (×4): 2.5 mg via ORAL
  Filled 2013-02-08 (×4): qty 1

## 2013-02-08 MED ORDER — TRACE MINERALS CR-CU-F-FE-I-MN-MO-SE-ZN IV SOLN
INTRAVENOUS | Status: AC
  Administered 2013-02-08 – 2013-02-09 (×2): via INTRAVENOUS
  Filled 2013-02-08: qty 1000

## 2013-02-08 MED ORDER — FUROSEMIDE 10 MG/ML IJ SOLN
20.0000 mg | Freq: Two times a day (BID) | INTRAMUSCULAR | Status: AC
  Administered 2013-02-08 – 2013-02-10 (×4): 20 mg via INTRAVENOUS
  Filled 2013-02-08 (×4): qty 2

## 2013-02-08 MED ORDER — FAT EMULSION 20 % IV EMUL
240.0000 mL | INTRAVENOUS | Status: AC
  Administered 2013-02-08 – 2013-02-09 (×2): 240 mL via INTRAVENOUS
  Filled 2013-02-08: qty 250

## 2013-02-08 MED ORDER — SODIUM CHLORIDE 0.9 % IJ SOLN
10.0000 mL | INTRAMUSCULAR | Status: DC | PRN
  Administered 2013-02-08 – 2013-02-16 (×13): 10 mL
  Administered 2013-02-17: 30 mL
  Administered 2013-02-18: 10 mL
  Administered 2013-02-19: 20 mL

## 2013-02-08 MED ORDER — PROMETHAZINE HCL 25 MG/ML IJ SOLN
6.2500 mg | Freq: Four times a day (QID) | INTRAMUSCULAR | Status: DC | PRN
  Administered 2013-02-08: 6.25 mg via INTRAVENOUS
  Administered 2013-02-09 – 2013-02-18 (×9): 12.5 mg via INTRAVENOUS
  Filled 2013-02-08 (×10): qty 1

## 2013-02-08 NOTE — Progress Notes (Signed)
INITIAL NUTRITION ASSESSMENT  DOCUMENTATION CODES Per approved criteria  -Severe malnutrition in the context of chronic illness   INTERVENTION: 1.  General healthful diet; discussed with PA who reports concern for pt's nutrition status and absorptive capabilities given presence of colostomy and fistula.  Recommend PO diet with supplements as able to promote gut integrity. 2.  Parenteral nutrition; per PharmD. Discussed with PharmD.   NUTRITION DIAGNOSIS: Altered GI function related to bowel surgeries, ileus, h/o SBOs as evidenced by medical status, MD report, need for nutrition support.   Monitor:  1.  Food/Beverage; PO/enteral diet as able to preserve gut integrity and promote gut health. 2.  Parenteral nutrition; per PharmD management to meet >/=90% estimated needs.  Reason for Assessment: consult; new TPN  37 y.o. female  Admitting Dx: abdominal pain  ASSESSMENT: Pt admitted with abdominal pain at colostomy site.  Pt with prolapse of colostomy s/p revision.  Pt progressed post-op and was able to achieve Regular diet, however unable to sustain well with bloating, distention, and pain. Discussed with PA who reports pt with upcoming surgery planned in 8 days and concern for poor nutrition status.  PA has ordered TPN for pt.  Discussed possibility of enteral nutrition for pt to promote gut health, however concern for current distention and malaborption with terminal ileal-rectal fistula.   Pt with h/o wt loss which is now masked by fluid status- edema in lower extremities.  Pt eating small amounts at meals.  Is receptive to Ensure Complete.   Pt previously dx with severe malnutrition of chronic illness based on wt and intake which is not improved.  Malnutrition ongoing.   Height: Ht Readings from Last 1 Encounters:  02/03/13 5\' 4"  (1.626 m)    Weight: Wt Readings from Last 1 Encounters:  02/03/13 132 lb (59.875 kg)    Ideal Body Weight: 120 lbs  % Ideal Body Weight:  108%  Wt Readings from Last 10 Encounters:  02/03/13 132 lb (59.875 kg)  02/03/13 132 lb (59.875 kg)  01/28/13 148 lb (67.132 kg)  01/04/13 131 lb (59.421 kg)  12/21/12 128 lb 3.2 oz (58.151 kg)  10/27/12 113 lb (51.256 kg)  09/22/12 110 lb 9.6 oz (50.168 kg)  08/11/12 143 lb 11.8 oz (65.2 kg)  08/11/12 143 lb 11.8 oz (65.2 kg)  08/07/12 139 lb 4.8 oz (63.186 kg)    Usual Body Weight: 160 lbs, last weighed 1 year ago  % Usual Body Weight: 82%  BMI:  Body mass index is 22.65 kg/(m^2).  Estimated Nutritional Needs: Kcal: 1800-1920 Protein: 80-95 Fluid: 1.8-2.0 L/day or as appropriate with ostomy output  Skin: incision, drain  Diet Order: Full Liquid  EDUCATION NEEDS: -No education needs identified at this time   Intake/Output Summary (Last 24 hours) at 02/08/13 1145 Last data filed at 02/08/13 0528  Gross per 24 hour  Intake 355.83 ml  Output   1710 ml  Net -1354.17 ml    Last BM: + colostomy output  Labs:   Recent Labs Lab 02/03/13 0154  NA 138  K 3.0*  CL 103  CO2 27  BUN 8  CREATININE 0.64  CALCIUM 9.5  GLUCOSE 98    CBG (last 3)  No results found for this basename: GLUCAP,  in the last 72 hours  Scheduled Meds: . enoxaparin (LOVENOX) injection  40 mg Subcutaneous Q24H  . feeding supplement  237 mL Oral BID BM  . lisinopril  5 mg Oral Daily  . pantoprazole  40 mg Oral  QHS  . traMADol  50 mg Oral Q6H    Continuous Infusions: . dextrose 5 % and 0.45 % NaCl with KCl 20 mEq/L 10 mL/hr (02/05/13 1819)    Past Medical History  Diagnosis Date  . History of blood transfusion     "I had ~ 7 in 01/2012"  . Anemia   . Cervical cancer ~ 2011    cervical  . Radiation Nov.18,2011-Jan.23,2012    cervix  . Hypertension     no treatment necessary for BP  since July 2013    Past Surgical History  Procedure Laterality Date  . Radiation implants  ~ 2011    "for cervical cancer"  . Colonoscopy  01/30/2012    Procedure: COLONOSCOPY;  Surgeon: Beverley Fiedler, MD;  Location: [facility] ENDOSCOPY;  Service: Gastroenterology;  Laterality: N/A;  . Colonoscopy  01/30/2012    Procedure: COLONOSCOPY;  Surgeon: Beverley Fiedler, MD;  Location: [facility] OR;  Service: Gastroenterology;  Laterality: N/A;  . Colostomy revision  02/04/2012    Procedure: COLON RESECTION SIGMOID;  Surgeon: Cherylynn Ridges, MD;  Location: [facility] OR;  Service: General;  Laterality: N/A;  . Cholecystectomy  2006  . Laparotomy  08/11/2012    Procedure: EXPLORATORY LAPAROTOMY;  Surgeon: Cherylynn Ridges, MD;  Location: [facility] OR;  Service: General;  Laterality: N/A;  . Laparoscopic lysis of adhesions  08/11/2012    Procedure: LAPAROSCOPIC LYSIS OF ADHESIONS;  Surgeon: Cherylynn Ridges, MD;  Location: [facility] OR;  Service: General;  Laterality: N/A;  . Bowel resection  08/11/2012    Procedure: SMALL BOWEL RESECTION;  Surgeon: Cherylynn Ridges, MD;  Location: [facility] OR;  Service: General;  Laterality: N/A;  . Colostomy  08/11/2012    Procedure: COLOSTOMY;  Surgeon: Cherylynn Ridges, MD;  Location: [facility] OR;  Service: General;  Laterality: N/A;  . Colostomy revision N/A 02/03/2013    Procedure: COLOSTOMY REVISION;  Surgeon: Cherylynn Ridges, MD;  Location: [facility] OR;  Service: General;  Laterality: N/A;    Loyce Dys, MS RD LDN Clinical Inpatient Dietitian Pager: [phone] Weekend/After hours pager: [phone]

## 2013-02-08 NOTE — Progress Notes (Signed)
Peripherally Inserted Central Catheter/Midline Placement  The IV Nurse has discussed with the patient and/or persons authorized to consent for the patient, the purpose of this procedure and the potential benefits and risks involved with this procedure.  The benefits include less needle sticks, lab draws from the catheter and patient may be discharged home with the catheter.  Risks include, but not limited to, infection, bleeding, blood clot (thrombus formation), and puncture of an artery; nerve damage and irregular heat beat.  Alternatives to this procedure were also discussed.  PICC/Midline Placement Documentation        Vanessa [name] 02/08/2013, 4:34 PM

## 2013-02-08 NOTE — Progress Notes (Signed)
PARENTERAL NUTRITION CONSULT NOTE - INITIAL  Pharmacy Consult for TPN Indication: possible obstruction, complex GI history with pending fistula repair  Allergies  Allergen Reactions  . Penicillins Other (See Comments)    "anything w/penicillin in it makes me bleed"  . Labetalol Hcl Itching and Other (See Comments)    Bumps to bilateral arms.  . Morphine And Related Hives    Patient Measurements: Height: 5\' 4"  (162.6 cm) Weight: 132 lb (59.875 kg) IBW/kg (Calculated) : 54.7  Vital Signs: Temp: 98.1 F (36.7 C) (05/19 0527) BP: 151/103 mmHg (05/19 0527) Pulse Rate: 86 (05/19 0527) Intake/Output from previous day: 05/18 0701 - 05/19 0700 In: 355.8 [I.V.:355.8] Out: 1710 [Urine:1700; Drains:10] Intake/Output from this shift:    Labs:  Recent Labs  02/07/13 1704  WBC 9.0  HGB 10.5*  HCT 31.5*  PLT 546*    No results found for this basename: NA, K, CL, CO2, GLUCOSE, BUN, CREATININE, LABCREA, CREAT24HRUR, CALCIUM, MG, PHOS, PROT, ALBUMIN, AST, ALT, ALKPHOS, BILITOT, BILIDIR, IBILI, PREALBUMIN, TRIG, CHOLHDL, CHOL,  in the last 72 hours Estimated Creatinine Clearance: 83.1 ml/min (by C-G formula based on Cr of 0.64).   No results found for this basename: GLUCAP,  in the last 72 hours  Medical History: Past Medical History  Diagnosis Date  . History of blood transfusion     "I had ~ 7 in 01/2012"  . Anemia   . Cervical cancer ~ 2011    cervical  . Radiation Nov.18,2011-Jan.23,2012    cervix  . Hypertension     no treatment necessary for BP  since July 2013    Medications:  Scheduled:  . enoxaparin (LOVENOX) injection  40 mg Subcutaneous Q24H  . feeding supplement  237 mL Oral BID BM  . lisinopril  5 mg Oral Daily  . pantoprazole  40 mg Oral QHS  . traMADol  50 mg Oral Q6H    Insulin Requirements in the past 24 hours:  None- no SSI ordered  Current Nutrition:  Full liquid diet, but patient with minimal intake; also receiving Ensure BID  BM  Assessment: Patient known to pharmacy from past TPN consults. She has a prolapsed ostomy which is s/p repair and is awaiting a fistula repair (scheduled for 5/27). Patient has extensive GI history. Now being worked up for possible obstruction. Through conversation with RD and surgery PA, desire TPN at this point to ensure patient stays nutritionally stable prior to any other procedures.  Nutritional Goals:  ~1800 kCal, 65 grams of protein per day per calculations- will update based on RD recommendations  GI: s/p prolapsed colectomy revision, now with potential obstruction. PO PPI  Endo: no hx, has not required insulin in her TPN in the past  Lytes: last BMET from 5/14- K was low at 3 and had received po that day; other lytes were normal on the 14th  Renal: SCr 0.64, CrCl ~70mL/min; has D5W/half NS with KCl running at 63mL/hr  Pulm: RA  Cards: BP sl elevated, HR nml- lisinopril  Hepatobil: alkphos, AST, ALT nml on 5/14- albumin low at 2.7, TBili low at 0.2  Neuro: no issues  ID:   WBC 9, afebrile, no abx  Best Practices: Lovenox 40  TPN Access: PICC to be placed 5/19 TPN day#: 0  Plan:  - will order a stat BMET, magnesium, and phosphorus to assess patient's baseline status - will replace lytes as necessary based on labs - Will start Clinimix E 5/15 at 39mL/hr at 1800 tonight  if PICC has been placed. Will communicate with RN. - will provide MVI/trace and IV Lipids MWF only due to ongoing shortages - will order TPN labs to start tomorrow - will start sensitive SSI/CBG q4h- if requirements are minimal, will D/C - will keep IVF at D5W/half NS with kCl today, will follow up IVF needs tomorrow - will follow up RD recommendations  Vanessa [name] D. Vanessa [name], PharmD Clinical Pharmacist Pager: [phone] 02/08/2013 12:04 PM

## 2013-02-08 NOTE — Progress Notes (Signed)
Patient ID: Vanessa [name], female   DOB: 1975-09-30, 37 y.o.   MRN: [id number] 5 Days Post-Op  Subjective: Pt c/o continued nausea and pain.  Oral pain meds not working, still taking quite a bit of dilaudid.  C/o blood clots in urine.  Tolerating some solid food, but states she did have an episode of emesis last night.  Still c/o lower extremity edema (explained the reasoning for this to her)  Objective: Vital signs in last 24 hours: Temp:  [98.1 F (36.7 C)-98.3 F (36.8 C)] 98.1 F (36.7 C) (05/19 0527) Pulse Rate:  [79-86] 86 (05/19 0527) Resp:  [16-18] 18 (05/19 0527) BP: (148-154)/(103-108) 151/103 mmHg (05/19 0527) SpO2:  [98 %-100 %] 100 % (05/19 0527) Last BM Date: 02/05/13  Intake/Output from previous day: 05/18 0701 - 05/19 0700 In: 355.8 [I.V.:355.8] Out: 1710 [Urine:1700; Drains:10] Intake/Output this shift:    PE: Heart: regular Lungs: CTAB Abd: soft, moderate distention, particularly around her ostomy.  Stoma is edematous with some prolapse.  There is some liquids in her bag, but nothing solid.  Some BS, tender around ostomy mostly Ext: bilateral lower extremity edema  Lab Results:   Recent Labs  02/07/13 1704  WBC 9.0  HGB 10.5*  HCT 31.5*  PLT 546*   BMET No results found for this basename: NA, K, CL, CO2, GLUCOSE, BUN, CREATININE, CALCIUM,  in the last 72 hours PT/INR No results found for this basename: LABPROT, INR,  in the last 72 hours CMP     Component Value Date/Time   NA 138 02/03/2013 0154   K 3.0* 02/03/2013 0154   CL 103 02/03/2013 0154   CO2 27 02/03/2013 0154   GLUCOSE 98 02/03/2013 0154   BUN 8 02/03/2013 0154   CREATININE 0.64 02/03/2013 0154   CALCIUM 9.5 02/03/2013 0154   CALCIUM 10.7* 02/01/2012 0705   PROT 6.4 02/03/2013 0154   ALBUMIN 2.7* 02/03/2013 0154   AST 15 02/03/2013 0154   ALT 13 02/03/2013 0154   ALKPHOS 116 02/03/2013 0154   BILITOT 0.2* 02/03/2013 0154   GFRNONAA >90 02/03/2013 0154   GFRAA >90 02/03/2013 0154   Lipase      Component Value Date/Time   LIPASE 8* 02/01/2013 0210       Studies/Results: No results found.  Anti-infectives: Anti-infectives   Start     Dose/Rate Route Frequency Ordered Stop   02/03/13 1315  ertapenem (INVANZ) 1 g in sodium chloride 0.9 % 50 mL IVPB  Status:  Discontinued     1 g 100 mL/hr over 30 Minutes Intravenous  Once 02/03/13 1312 02/03/13 1650   02/03/13 0800  ertapenem (INVANZ) 1 g in sodium chloride 0.9 % 50 mL IVPB  Status:  Discontinued     1 g 100 mL/hr over 30 Minutes Intravenous Every 24 hours 02/03/13 0759 02/03/13 1702       Assessment/Plan  1. 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 Bilateral lower extremity edema, secondary to #3 5. Hematuria 6. HTN 7. H/o anemia secondary to ABL from severe thrombocytopenia from sepsis 8. H/o cervical cancer, s/p hysterectomy and radiation  Plan: 1. I have thoroughly discussed this patient with Dr. Dwain Sarna.  She is having continued c/o pain and nausea with one episode of emesis over night.  She was able to eat a bagel this morning; however, she seems distended.  We will obtain abdominal films today to rule out obstruction  or any other cause of distention. She is still passing things from her rectum and her bag, but mostly liquid. 2. She also has an albumin of 2.7.  Her edema is likely secondary to hypoalbuminemia.  Since she has an operation in just 8 days for a fistula repair, her nutrition should be maximized.  Therefore, we will place a PICC and start her on TNA since she is unable to take in much orally as well. 3. I have spoken with Dr. Robb Matar with the medicine service.  They will come evaluate the patient for her HTN and initiate scheduled therapy.  We appreciate their assistance. 4. The patient has also c/o hematuria.  Apparently on her recent UAs over the last week or so she has had quite  a bit of blood present.  This has not been followed up on.  Last night she started passing clots in her urine, which have ceased as of now.  A repeat UA shows persistent gross blood.  She has significant bladder wall thickening on her CT scan, likely secondary to surrounding inflammation from her abdominal processes; however, as this is abnormal, and I have called urology to evaluate her for further work up and recommendations.  We appreciate their input as well. 5. Will check labs in the am.  K low yesterday.  Will likely need some replacement.    LOS: 5 days    Cecille Mcclusky E 02/08/2013, 10:42 AM Pager: [phone]

## 2013-02-08 NOTE — Consult Note (Addendum)
[facility] Medical Consultation  Vanessa [name] EXB:[id number] DOB: Jul 12, 1976 DOA: 02/03/2013 PCP: Dorrene German, MD   Requesting physician: Dr. Janee Morn Date of consultation: 5.19.2014 Reason for consultation: HTN  Impression/Recommendations Principal Problem:   Essential hypertension, benign Active Problems:   Post-radiation rectal bleeding   Ileostomy in place   Colocutaneous fistula   Thrombocytosis  - I'm glad to be able to participate on Mrs. Andreen is care, she is a slightly sad because of everything that's going on. She relates to me no expressions of suicidal thoughts or that degree of depression. She just like to be at home with her family. She has no anhedonia.  - She is mildly overloaded with positive JVD and mild crackles on the right. Her albumin is only mildly decreased at 2.7. Which may be contributin 2 lower extremity edema but will not explain her JVD and her crackles in her lungs.  - I will go ahead and start her on low-dose diuretic, check a basic metabolic panel in the morning to monitor her electrolytes. I will start her on Norvasc 2.5. We'll try to keep a close eye on her electrolytes especially her potassium.  - Her thrombocytosis most likely reactive as she is having ongoing inflammation.  Her hemoglobin seems to be stable.  I will followup again tomorrow. Please contact me if I can be of assistance in the meanwhile. Thank you for this consultation.  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 We were asked to consult for further management of her hypertension. She relates to some mild shortness of breath and abdominal discomfort. She also relates that she has  lower extremity swelling that is progressively been getting worse.  She relates she would like to go home and she has been in the hospital for the last several months.   Review of Systems:  Constitutional:  No  night sweats, Fevers, chills, fatigue.  HEENT:  No headaches, Difficulty swallowing,Tooth/dental problems,Sore throat,  No sneezing, itching, ear ache, nasal congestion, post nasal drip,  Cardio-vascular:  No chest pain, Orthopnea, PND,  dizziness, palpitations  Resp:  No excess mucus, no productive cough, No non-productive cough, No coughing up of blood.No change in color of mucus.No wheezing.No chest wall deformity  Skin:  no rash or lesions.  GU:  no dysuria, change in color of urine, no urgency or frequency. No flank pain.  Musculoskeletal:  No joint pain or swelling. No decreased range of motion. No back pain.  Psych:  No change in mood or affect. No depression or anxiety. No memory loss.    Past Medical History  Diagnosis Date  . History of blood transfusion     "I had ~ 7 in 01/2012"  . Anemia   . Cervical cancer ~ 2011    cervical  . Radiation Nov.18,2011-Jan.23,2012    cervix  . Hypertension     no treatment necessary for BP  since July 2013   Past Surgical History  Procedure Laterality Date  . Radiation implants  ~ 2011    "for cervical cancer"  . Colonoscopy  01/30/2012    Procedure: COLONOSCOPY;  Surgeon: Beverley Fiedler, MD;  Location: [facility] ENDOSCOPY;  Service: Gastroenterology;  Laterality: N/A;  . Colonoscopy  01/30/2012    Procedure: COLONOSCOPY;  Surgeon: Beverley Fiedler, MD;  Location: [facility] OR;  Service: Gastroenterology;  Laterality: N/A;  . Colostomy revision  02/04/2012    Procedure: COLON RESECTION SIGMOID;  Surgeon: Cherylynn Ridges, MD;  Location: [facility] OR;  Service: General;  Laterality: N/A;  . Cholecystectomy  2006  . Laparotomy  08/11/2012    Procedure: EXPLORATORY LAPAROTOMY;  Surgeon: Cherylynn Ridges, MD;  Location: [facility] OR;  Service: General;  Laterality:  N/A;  . Laparoscopic lysis of adhesions  08/11/2012    Procedure: LAPAROSCOPIC LYSIS OF ADHESIONS;  Surgeon: Cherylynn Ridges, MD;  Location: [facility] OR;  Service: General;  Laterality: N/A;  . Bowel resection  08/11/2012    Procedure: SMALL BOWEL RESECTION;  Surgeon: Cherylynn Ridges, MD;  Location: [facility] OR;  Service: General;  Laterality: N/A;  . Colostomy  08/11/2012    Procedure: COLOSTOMY;  Surgeon: Cherylynn Ridges, MD;  Location: [facility] OR;  Service: General;  Laterality: N/A;  . Colostomy revision N/A 02/03/2013    Procedure: COLOSTOMY REVISION;  Surgeon: Cherylynn Ridges, MD;  Location: [facility] OR;  Service: General;  Laterality: N/A;   Social History:  reports that she quit smoking about a year ago. Her smoking use included Cigarettes. She has a .25 pack-year smoking history. She has never used smokeless tobacco. She reports that  drinks alcohol. She reports that she uses illicit drugs (Marijuana).  Allergies  Allergen Reactions  . Penicillins Other (See Comments)    "anything w/penicillin in it makes me bleed"  . Labetalol Hcl Itching and Other (See Comments)    Bumps to bilateral arms.  . Morphine And Related Hives   History reviewed. No pertinent family history.  Prior to Admission medications   Medication Sig Start Date End Date Taking? Authorizing Provider  feeding supplement (ENSURE COMPLETE) LIQD Take 237 mLs by mouth 2 (two) times daily between meals. 01/30/13  Yes Vassie Loll, MD  magnesium oxide (MAG-OX) 400 (241.3 MG) MG tablet Take 1 tablet (400 mg total) by mouth daily. 01/30/13  Yes Vassie Loll, MD  metoCLOPramide (REGLAN) 10 MG tablet Take 1 tablet (10 mg total) by mouth 4 (four) times daily as needed (nausea). 02/01/13  Yes Charles B. Bernette Mayers, MD  ondansetron (ZOFRAN ODT) 8 MG disintegrating tablet Take 1 tablet (8 mg total) by mouth every 8 (eight) hours as needed for nausea. 01/30/13  Yes Vassie Loll, MD  oxyCODONE-acetaminophen (PERCOCET) 10-325 MG per tablet Take 1 tablet by mouth  every 6 (six) hours as needed for pain. 01/30/13 01/30/14 Yes Vassie Loll, MD  potassium chloride SA (K-DUR,KLOR-CON) 20 MEQ tablet Take 1 tablet (20 mEq total) by mouth 3 (three) times daily. 01/30/13  Yes Vassie Loll, MD   Physical Exam: Blood pressure 151/103, pulse 86, temperature 98.1 F (36.7 C), temperature source Oral, resp. rate 18, height 5\' 4"  (1.626 m), weight 59.875 kg (132 lb), SpO2 100.00%. Filed Vitals:   02/07/13 1649 02/07/13 2130 02/07/13 2310 02/08/13 0527  BP: 151/108 149/104 148/108 151/103  Pulse:   86 86  Temp:   98.3 F (36.8 C) 98.1 F (36.7 C)  TempSrc:   Oral   Resp:   17 18  Height:      Weight:      SpO2:   100% 100%     General:  Awake alert oriented x3  Eyes: Anicteric  ENT: Moist mucous membranes, poor oral hygiene  Neck: Positive JVD positive hepatojugular reflux  Cardiovascular: Regular rate and rhythm with positive S1-S2 no appreciated  murmurs rubs gallops  Respiratory: Good air movement with crackles on the right.  Abdomen: Abdomen is soft mildly distended with edematous stoma.  Skin: No rashes or ulcerations  Musculoskeletal: Intact  Psychiatric: Appropriate  Neurologic: Weak alert and oriented x4 coherent for language grossly intact  Labs on Admission:  Basic Metabolic Panel:  Recent Labs Lab 02/03/13 0154 02/08/13 1200  NA 138 143  K 3.0* 4.1  CL 103 107  CO2 27 25  GLUCOSE 98 98  BUN 8 7  CREATININE 0.64 0.83  CALCIUM 9.5 10.3  MG  --  1.9  PHOS  --  3.4   Liver Function Tests:  Recent Labs Lab 02/03/13 0154  AST 15  ALT 13  ALKPHOS 116  BILITOT 0.2*  PROT 6.4  ALBUMIN 2.7*   No results found for this basename: LIPASE, AMYLASE,  in the last 168 hours No results found for this basename: AMMONIA,  in the last 168 hours CBC:  Recent Labs Lab 02/03/13 0154 02/07/13 1704  WBC 7.0 9.0  HGB 9.9* 10.5*  HCT 29.3* 31.5*  MCV 89.1 89.0  PLT 471* 546*   Cardiac Enzymes: No results found for this  basename: CKTOTAL, CKMB, CKMBINDEX, TROPONINI,  in the last 168 hours BNP: No components found with this basename: POCBNP,  CBG: No results found for this basename: GLUCAP,  in the last 168 hours  Radiological Exams on Admission: No results found.  EKG: Independently reviewed.  None  Time spent: 70 minutes  Marinda Elk [facility] Pager [phone]  If 7PM-7AM, please contact night-coverage [email or web address] Password [name] 02/08/2013, 1:17 PM

## 2013-02-08 NOTE — Progress Notes (Signed)
Agree with above, I digitalized her swollen colostomy, it is patent deep to fascia but edematous

## 2013-02-08 NOTE — Progress Notes (Signed)
Patient ID: Vanessa [name], female   DOB: Jun 01, 1976, 37 y.o.   MRN: [id number]  I came by to see patient but they had just started a PICC line. I was not granted access to room. I will return to see patient for consult.

## 2013-02-09 DIAGNOSIS — E46 Unspecified protein-calorie malnutrition: Secondary | ICD-10-CM

## 2013-02-09 DIAGNOSIS — K929 Disease of digestive system, unspecified: Secondary | ICD-10-CM

## 2013-02-09 DIAGNOSIS — D649 Anemia, unspecified: Secondary | ICD-10-CM

## 2013-02-09 DIAGNOSIS — E876 Hypokalemia: Secondary | ICD-10-CM

## 2013-02-09 LAB — COMPREHENSIVE METABOLIC PANEL
ALT: 10 U/L (ref 0–35)
AST: 15 U/L (ref 0–37)
Albumin: 2.5 g/dL — ABNORMAL LOW (ref 3.5–5.2)
CO2: 25 mEq/L (ref 19–32)
Calcium: 9.9 mg/dL (ref 8.4–10.5)
GFR calc non Af Amer: >90 mL/min (ref >90)
Sodium: 143 mEq/L (ref 135–145)

## 2013-02-09 LAB — CBC
MCH: 28.9 pg (ref 26.0–34.0)
Platelets: 500 10*3/uL — ABNORMAL HIGH (ref 150–400)
RBC: 3.18 MIL/uL — ABNORMAL LOW (ref 3.87–5.11)
RDW: 15.4 % (ref 11.5–15.5)
WBC: 7.7 10*3/uL (ref 4.0–10.5)

## 2013-02-09 LAB — GLUCOSE, CAPILLARY
Glucose-Capillary: 94 mg/dL (ref 70–99)
Glucose-Capillary: 99 mg/dL (ref 70–99)
Glucose-Capillary: 94 mg/dL (ref 70–99)
Glucose-Capillary: 98 mg/dL (ref 70–99)
Glucose-Capillary: 88 mg/dL (ref 70–99)
Glucose-Capillary: 118 mg/dL — ABNORMAL HIGH (ref 70–99)

## 2013-02-09 LAB — DIFFERENTIAL
Basophils Relative: 1 % (ref 0–1)
Eosinophils Absolute: 0.2 10*3/uL (ref 0.0–0.7)
Eosinophils Relative: 2 % (ref 0–5)
Lymphocytes Relative: 18 % (ref 12–46)
Monocytes Relative: 11 % (ref 3–12)
Neutro Abs: 5.2 10*3/uL (ref 1.7–7.7)

## 2013-02-09 LAB — CHOLESTEROL, TOTAL: Cholesterol: 64 mg/dL (ref 0–200)

## 2013-02-09 LAB — TRIGLYCERIDES: Triglycerides: 71 mg/dL (ref <150)

## 2013-02-09 MED ORDER — FLEET ENEMA 7-19 GM/118ML RE ENEM
1.0000 | ENEMA | Freq: Once | RECTAL | Status: AC
  Administered 2013-02-09: 15:00:00 via RECTAL
  Filled 2013-02-09 (×2): qty 1

## 2013-02-09 MED ORDER — POTASSIUM CHLORIDE 10 MEQ/50ML IV SOLN
10.0000 meq | INTRAVENOUS | Status: AC
  Administered 2013-02-09 (×4): 10 meq via INTRAVENOUS
  Filled 2013-02-09 (×5): qty 50

## 2013-02-09 MED ORDER — CLINIMIX E/DEXTROSE (5/15) 5 % IV SOLN
INTRAVENOUS | Status: AC
  Administered 2013-02-09: 17:00:00 via INTRAVENOUS
  Filled 2013-02-09: qty 2000

## 2013-02-09 MED ORDER — LISINOPRIL 10 MG PO TABS
10.0000 mg | ORAL_TABLET | Freq: Every day | ORAL | Status: DC
  Administered 2013-02-10 – 2013-02-16 (×7): 10 mg via ORAL
  Filled 2013-02-09 (×8): qty 1

## 2013-02-09 MED ORDER — SODIUM CHLORIDE 0.45 % IV SOLN
INTRAVENOUS | Status: DC
  Administered 2013-02-09 – 2013-02-14 (×2): 20 mL/h via INTRAVENOUS
  Administered 2013-02-17: 20 mL via INTRAVENOUS

## 2013-02-09 MED ORDER — HYDRALAZINE HCL 20 MG/ML IJ SOLN
5.0000 mg | INTRAMUSCULAR | Status: DC | PRN
  Administered 2013-02-10: 5 mg via INTRAVENOUS
  Filled 2013-02-09: qty 1

## 2013-02-09 NOTE — Progress Notes (Signed)
PARENTERAL NUTRITION CONSULT NOTE - INITIAL  Pharmacy Consult for TPN Indication: possible obstruction, complex GI history with pending fistula repair  Allergies  Allergen Reactions  . Penicillins Other (See Comments)    "anything w/penicillin in it makes me bleed"  . Labetalol Hcl Itching and Other (See Comments)    Bumps to bilateral arms.  . Morphine And Related Hives    Patient Measurements: Height: 5\' 4"  (162.6 cm) Weight: 131 lb 8 oz (59.648 kg) IBW/kg (Calculated) : 54.7  Vital Signs: Temp: 97.4 F (36.3 C) (05/20 0528) Temp src: Oral (05/20 0528) BP: 151/109 mmHg (05/20 0528) Pulse Rate: 94 (05/20 0528) Intake/Output from previous day: 05/19 0701 - 05/20 0700 In: 2614 [P.O.:720; I.V.:1166; TPN:728] Out: 1929 [Urine:1900; Drains:28; Stool:1] Intake/Output from this shift:    Labs:  Recent Labs  02/07/13 1704 02/09/13 0622  WBC 9.0 7.7  HGB 10.5* 9.2*  HCT 31.5* 28.4*  PLT 546* 500*     Recent Labs  02/08/13 1200 02/09/13 0622  NA 143 143  K 4.1 3.1*  CL 107 107  CO2 25 25  GLUCOSE 98 102*  BUN 7 8  CREATININE 0.83 0.75  CALCIUM 10.3 9.9  MG 1.9  --   PHOS 3.4  --   PROT  --  6.1  ALBUMIN  --  2.5*  AST  --  15  ALT  --  10  ALKPHOS  --  90  BILITOT  --  0.2*  PREALBUMIN 12.1*  --   TRIG  --  71  CHOL  --  64   Estimated Creatinine Clearance: 83.1 ml/min (by C-G formula based on Cr of 0.75).    Recent Labs  02/08/13 2330 02/09/13 0345 02/09/13 0754  GLUCAP 99 94 94    Medical History: Past Medical History  Diagnosis Date  . History of blood transfusion     "I had ~ 7 in 01/2012"  . Anemia   . Cervical cancer ~ 2011    cervical  . Radiation Nov.18,2011-Jan.23,2012    cervix  . Hypertension     no treatment necessary for BP  since July 2013    Insulin Requirements in the past 24 hours:  None  Current Nutrition:  Full liquid diet- noted 50% intake yesterday, also receiving Ensure BID BM; Clinimix E 5/15 at 109mL/hr;  goal rate of 86mL/hr would provide 95g of protein/day and average of 1560kcal/day with fats given 3x week. Would meet 100% of protein goal and 86% of kcal goal  Assessment: Patient known to pharmacy from past TPN consults. She has a prolapsed ostomy which is s/p repair and is awaiting a fistula repair (scheduled for 5/27). Patient has extensive GI history. Now being worked up for possible obstruction. Through conversation with RD and surgery PA, desire TPN at this point to ensure patient stays nutritionally stable prior to any other procedures.  Nutritional Goals:  1800-1920 kCal, 80-95 grams of protein per day per RD recommendations 5/19  GI: s/p prolapsed colectomy revision, now with potential obstruction. PO PPI  Endo: no hx, has not required insulin in her TPN in the past; CBGs 94-99 in the last 24 hours (all taken while on TPN)  Lytes: K low at 3.1, baseline phos good at 3.4, baseline mag good at 1.9, all other lytes normal  Renal: SCr 0.75, CrCl ~37mL/min; has D5W/half NS with KCl running at 110mL/hr  Pulm: RA  Cards: BP sl elevated, HR nml- lisinopril, amlodipine, Lasix po  Hepatobil: alkphos, AST,  ALT all nml, albumin low at 2.5, TBili low at 0.2; baseline prealbumin 12.1; baseline trigs 71  Neuro: no issues  ID: WBC 7.7, afebrile, no abx  Best Practices: Lovenox 40  TPN Access: PICC placed 5/19 TPN day#: 1 (started 5/19)  Plan:  - will give 4 runs of KCl to replete potassium - Will increase Clinimix E 5/15 to goal rate of 60mL/hr - will provide MVI/trace and IV Lipids MWF only due to ongoing shortages - will continue SSI/CBG q4h- if requirements remain minimal, will D/C - will change IVF to half NS WITHOUT KCl starting with bag at 1800 - will follow up diet progression - will order BMET, phos and mag tomorrow morning  Larry Knipp D. Graciano Batson, PharmD Clinical Pharmacist Pager: [phone] 02/09/2013 9:09 AM

## 2013-02-09 NOTE — Progress Notes (Signed)
Patient ID: Vanessa [name], female   DOB: [birth year], 37 y.o.   MRN: [id number] 6 Days Post-Op  Subjective: Pt feels about the same today.  Not much coming from her ostomy except liquid.  Still with bloating.  Still with some hematuria, but less.  Objective: Vital signs in last 24 hours: Temp:  [97.4 F (36.3 C)-98.2 F (36.8 C)] 97.4 F (36.3 C) (05/20 0528) Pulse Rate:  [87-94] 94 (05/20 0528) Resp:  [15-18] 15 (05/20 0528) BP: (132-151)/(93-109) 151/109 mmHg (05/20 0528) SpO2:  [100 %] 100 % (05/20 0528) Weight:  [131 lb 8 oz (59.648 kg)] 131 lb 8 oz (59.648 kg) (05/20 0528) Last BM Date: 02/05/13  Intake/Output from previous day: 2023-02-25 0701 - 05/20 0700 In: 2614 [P.O.:720; I.V.:1166; TPN:728] Out: 1929 [Urine:1900; Drains:28; Stool:1] Intake/Output this shift:    PE: Abd: soft, but distended, few BS, ostomy still edematous.  Digitalization reveals a tight stoma at the fascia with what feels like solid stool behind this.   Heart: regular Lungs: CTAB  Lab Results:   Recent Labs  02/07/13 1704 02/09/13 0622  WBC 9.0 7.7  HGB 10.5* 9.2*  HCT 31.5* 28.4*  PLT 546* 500*   BMET  Recent Labs  2013-02-24 1200 02/09/13 0622  NA 143 143  K 4.1 3.1*  CL 107 107  CO2 25 25  GLUCOSE 98 102*  BUN 7 8  CREATININE 0.83 0.75  CALCIUM 10.3 9.9   PT/INR No results found for this basename: LABPROT, INR,  in the last 72 hours CMP     Component Value Date/Time   NA 143 02/09/2013 0622   K 3.1* 02/09/2013 0622   CL 107 02/09/2013 0622   CO2 25 02/09/2013 0622   GLUCOSE 102* 02/09/2013 0622   BUN 8 02/09/2013 0622   CREATININE 0.75 02/09/2013 0622   CALCIUM 9.9 02/09/2013 0622   CALCIUM 10.7* 02/01/2012 0705   PROT 6.1 02/09/2013 0622   ALBUMIN 2.5* 02/09/2013 0622   AST 15 02/09/2013 0622   ALT 10 02/09/2013 0622   ALKPHOS 90 02/09/2013 0622   BILITOT 0.2* 02/09/2013 0622   GFRNONAA >90 02/09/2013 0622   GFRAA >90 02/09/2013 0622   Lipase     Component Value Date/Time   LIPASE 8* 02/01/2013 0210       Studies/Results: Dg Abd 2 Views  Feb 24, 2013   *RADIOLOGY REPORT*  Clinical Data: Prolapsing colostomy and abdominal distention.  ABDOMEN - 2 VIEW  Comparison: 02/01/2013  Findings: There is a left lower quadrant colostomy.  Left lower quadrant pigtail drainage catheter is identified in the projection of the left iliac bone.  There is moderate gaseous distention of the transverse colon.  A few prominent small bowel loops are noted which measure up to 2.8 cm.  IMPRESSION:  1.  Moderate gaseous distention of the transverse colon. 2.  Pigtail drainage catheter projects over the left iliac bone and is presumably within the left iliac fossa as before.   Original Report Authenticated By: Signa Kell, M.D.    Anti-infectives: Anti-infectives   Start     Dose/Rate Route Frequency Ordered Stop   02/03/13 1315  ertapenem (INVANZ) 1 g in sodium chloride 0.9 % 50 mL IVPB  Status:  Discontinued     1 g 100 mL/hr over 30 Minutes Intravenous  Once 02/03/13 1312 02/03/13 1650   02/03/13 0800  ertapenem (INVANZ) 1 g in sodium chloride 0.9 % 50 mL IVPB  Status:  Discontinued     1 g 100  mL/hr over 30 Minutes Intravenous Every 24 hours 02/03/13 0759 02/03/13 1702       Assessment/Plan 1. Prolapsed ostomy, s/p repair with recurrent non-ischemic prolapse  2. enterocolonic fistula, needs repair, scheduled for 02-16-13  3. 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 Bilateral lower extremity edema, secondary to #3  5. Hematuria  6. HTN  7. H/o anemia secondary to ABL from severe thrombocytopenia from sepsis, still with anemia 8. H/o cervical cancer, s/p hysterectomy and radiation 9. PCM/TNA (prealbumin 12) 10. Hypokalemia  Plan: 1. Appreciate urology's note.  Will d/w with MD.  Unfortunately, Dr. [name] will not be back until next week to talk to about how he would like to proceed.  Will d/w Dr. Dwain Sarna in the meantime. 2. Follow hgb 3. Will d/w MD about stoma.  ? Whether an enema  may help some of this more solid stool liquify and pass. 4. Cont TNA for PCM 5. Replace K 6. BP per medicine     LOS: 6 days    [name] E 02/09/2013, 10:47 AM Pager: [phone]

## 2013-02-09 NOTE — Progress Notes (Signed)
Received call from lab with panic INR of 5.59,md paged,awaiting reply.

## 2013-02-09 NOTE — Progress Notes (Signed)
[facility] PROGRESS NOTE  Vanessa [name] WUJ:[id number] DOB: 04/03/1976 DOA: 02/03/2013 PCP: Vanessa German, MD  Assessment/Plan:  Hypertension: This is a supple pressure remains over 100. We will increase in patients in a lisinopril to 10 mg.  We'll also add hydralazine PRN.  Volume overload: The patient's fluid status appears to be stable currently I/O. patient's GFR is normal. Lungs are clear on exam.  Thrombocytosis: Likely reactive this appears be stable.    HPI/Subjective: Patient is without complaint  Objective: Filed Vitals:   02/08/13 0527 02/08/13 1402 02/08/13 2114 02/09/13 0528  BP: 151/103 132/93 149/102 151/109  Pulse: 86 88 87 94  Temp: 98.1 F (36.7 C) 98.2 F (36.8 C) 98 F (36.7 C) 97.4 F (36.3 C)  TempSrc:  Oral Oral Oral  Resp: 18 18 18 15   Height:      Weight:    59.648 kg (131 lb 8 oz)  SpO2: 100% 100% 100% 100%    Intake/Output Summary (Last 24 hours) at 02/09/13 1345 Last data filed at 02/09/13 0520  Gross per 24 hour  Intake   2374 ml  Output   1929 ml  Net    445 ml   Filed Weights   02/03/13 1720 02/09/13 0528  Weight: 59.875 kg (132 lb) 59.648 kg (131 lb 8 oz)    Exam:   General:  Patient is awake sitting in no apparent distress  Cardiovascular: Regular, S1-S2  Respiratory: Normal respiratory effort, no crackles no wheezing  Abdomen: Soft, positive bowel sounds  Musculoskeletal: Perfused, no clubbing or cyanosis   Data Reviewed: Basic Metabolic Panel:  Recent Labs Lab 02/03/13 0154 02/08/13 1200 02/09/13 0622  NA 138 143 143  K 3.0* 4.1 3.1*  CL 103 107 107  CO2 27 25 25   GLUCOSE 98 98 102*  BUN 8 7 8   CREATININE 0.64 0.83 0.75  CALCIUM 9.5 10.3 9.9  MG  --  1.9  --   PHOS  --  3.4  --    Liver Function Tests:  Recent Labs Lab 02/03/13 0154 02/09/13 0622  AST 15 15  ALT 13 10  ALKPHOS 116 90  BILITOT 0.2* 0.2*  PROT 6.4 6.1  ALBUMIN 2.7* 2.5*   No results found for this basename:  LIPASE, AMYLASE,  in the last 168 hours No results found for this basename: AMMONIA,  in the last 168 hours CBC:  Recent Labs Lab 02/03/13 0154 02/07/13 1704 02/09/13 0622  WBC 7.0 9.0 7.7  NEUTROABS  --   --  5.2  HGB 9.9* 10.5* 9.2*  HCT 29.3* 31.5* 28.4*  MCV 89.1 89.0 89.3  PLT 471* 546* 500*   Cardiac Enzymes: No results found for this basename: CKTOTAL, CKMB, CKMBINDEX, TROPONINI,  in the last 168 hours BNP (last 3 results) No results found for this basename: PROBNP,  in the last 8760 hours CBG:  Recent Labs Lab 02/08/13 1941 02/08/13 2330 02/09/13 0345 02/09/13 0754 02/09/13 1148  GLUCAP 95 99 94 94 98    Recent Results (from the past 240 hour(s))  URINE CULTURE     Status: None   Collection Time    02/01/13  4:45 AM      Result Value Range Status   Specimen Description URINE, CLEAN CATCH   Final   Special Requests CX ADDED AT 0510 ON [id number]   Final   Culture  Setup Time 02/01/2013 11:31   Final   Colony Count 40,000 COLONIES/ML   Final   Culture  Final   Value: Multiple bacterial morphotypes present, none predominant. Suggest appropriate recollection if clinically indicated.   Report Status 02/02/2013 FINAL   Final  URINE CULTURE     Status: None   Collection Time    02/07/13  4:59 PM      Result Value Range Status   Specimen Description URINE, CLEAN CATCH   Final   Special Requests NONE   Final   Culture  Setup Time 02/07/2013 22:10   Final   Colony Count >=100,000 COLONIES/ML   Final   Culture     Final   Value: Multiple bacterial morphotypes present, none predominant. Suggest appropriate recollection if clinically indicated.   Report Status 02/08/2013 FINAL   Final     Studies: Dg Abd 2 Views  02/08/2013   *RADIOLOGY REPORT*  Clinical Data: Prolapsing colostomy and abdominal distention.  ABDOMEN - 2 VIEW  Comparison: 02/01/2013  Findings: There is a left lower quadrant colostomy.  Left lower quadrant pigtail drainage catheter is identified in  the projection of the left iliac bone.  There is moderate gaseous distention of the transverse colon.  A few prominent small bowel loops are noted which measure up to 2.8 cm.  IMPRESSION:  1.  Moderate gaseous distention of the transverse colon. 2.  Pigtail drainage catheter projects over the left iliac bone and is presumably within the left iliac fossa as before.   Original Report Authenticated By: Signa Kell, M.D.    Scheduled Meds: . amLODipine  2.5 mg Oral Daily  . enoxaparin (LOVENOX) injection  40 mg Subcutaneous Q24H  . feeding supplement  237 mL Oral BID BM  . furosemide  20 mg Intravenous BID  . insulin aspart  0-9 Units Subcutaneous Q4H  . [START ON 02/10/2013] lisinopril  10 mg Oral Daily  . pantoprazole  40 mg Oral QHS  . potassium chloride  10 mEq Intravenous Q1 Hr x 4  . sodium phosphate  1 enema Rectal Once  . traMADol  50 mg Oral Q6H   Continuous Infusions: . Marland KitchenTPN (CLINIMIX-E) Adult    . sodium chloride    . dextrose 5 % and 0.45 % NaCl with KCl 20 mEq/L 20 mL/hr at 02/09/13 0241  . Marland KitchenTPN (CLINIMIX-E) Adult 40 mL/hr at 02/09/13 0902   And  . fat emulsion 240 mL (02/09/13 0903)    Principal Problem:   Essential hypertension, benign Active Problems:   Post-radiation rectal bleeding   Ileostomy in place   Colocutaneous fistula   Thrombocytosis    Time spent: 20 minutes    Vanessa [name] K  [facility] Pager [phone]. If 7PM-7AM, please contact night-coverage at [email or web address], password [email or web address] 02/09/2013, 1:45 PM  LOS: 6 days

## 2013-02-09 NOTE — Consult Note (Addendum)
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 In hospital with prolapsed, ischemia of stoma. She has had red urine for past few days. Denies significant dysuria or bothersome urinary symptoms. Voids with a good flow. She denies hysterectomy or other pelvic surgery and GU surgery.   Past Medical History  Diagnosis Date  . History of blood transfusion     "I had ~ 7 in 01/2012"  . Anemia   . Cervical cancer ~ 2011    cervical  . Radiation Nov.18,2011-Jan.23,2012    cervix  . Hypertension     no treatment necessary for BP  since July 2013   Past Surgical History  Procedure Laterality Date  . Radiation implants  ~ 2011    "for cervical cancer"  . Colonoscopy  01/30/2012    Procedure: COLONOSCOPY;  Surgeon: Beverley Fiedler, MD;  Location: [facility] ENDOSCOPY;  Service: Gastroenterology;  Laterality: N/A;  . Colonoscopy  01/30/2012    Procedure: COLONOSCOPY;  Surgeon: Beverley Fiedler, MD;  Location: [facility] OR;  Service: Gastroenterology;  Laterality: N/A;  . Colostomy revision  02/04/2012    Procedure: COLON RESECTION SIGMOID;  Surgeon: Cherylynn Ridges, MD;  Location: [facility] OR;  Service: General;  Laterality: N/A;  . Cholecystectomy  2006  . Laparotomy  08/11/2012    Procedure: EXPLORATORY LAPAROTOMY;  Surgeon: Cherylynn Ridges, MD;  Location: [facility] OR;  Service: General;  Laterality: N/A;  . Laparoscopic lysis of adhesions  08/11/2012    Procedure: LAPAROSCOPIC LYSIS OF ADHESIONS;  Surgeon: Cherylynn Ridges, MD;  Location: [facility] OR;  Service: General;  Laterality: N/A;  . Bowel resection  08/11/2012    Procedure: SMALL BOWEL RESECTION;  Surgeon: Cherylynn Ridges, MD;  Location: [facility] OR;  Service: General;  Laterality: N/A;  . Colostomy  08/11/2012    Procedure: COLOSTOMY;  Surgeon: Cherylynn Ridges, MD;   Location: [facility] OR;  Service: General;  Laterality: N/A;  . Colostomy revision N/A 02/03/2013    Procedure: COLOSTOMY REVISION;  Surgeon: Cherylynn Ridges, MD;  Location: [facility] OR;  Service: General;  Laterality: N/A;    Home Medications:  Prescriptions prior to admission  Medication Sig Dispense Refill  . feeding supplement (ENSURE COMPLETE) LIQD Take 237 mLs by mouth 2 (two) times daily between meals.      . magnesium oxide (MAG-OX) 400 (241.3 MG) MG tablet Take 1 tablet (400 mg total) by mouth daily.  30 tablet  1  . metoCLOPramide (REGLAN) 10 MG tablet Take 1 tablet (10 mg total) by mouth 4 (four) times daily as needed (nausea).  30 tablet  0  . ondansetron (ZOFRAN ODT) 8 MG disintegrating tablet Take 1 tablet (8 mg total) by mouth every 8 (eight) hours as needed for nausea.  20 tablet  0  . oxyCODONE-acetaminophen (PERCOCET) 10-325 MG per tablet Take 1 tablet by mouth every 6 (six) hours as needed for pain.  45 tablet  0  . potassium chloride SA (K-DUR,KLOR-CON) 20 MEQ tablet Take 1 tablet (20 mEq total) by mouth 3 (three) times daily.  90 tablet  0   Allergies:  Allergies  Allergen Reactions  . Penicillins Other (See Comments)    "anything w/penicillin in it makes me bleed"  . Labetalol Hcl Itching and Other (See Comments)  Bumps to bilateral arms.  . Morphine And Related Hives    History reviewed. No pertinent family history. Social History:  reports that she quit smoking about 12 months ago. Her smoking use included Cigarettes. She has a .25 pack-year smoking history. She has never used smokeless tobacco. She reports that  drinks alcohol. She reports that she uses illicit drugs (Marijuana).  ROS: A complete review of systems was performed.  All systems are negative except for pertinent findings as noted. @ROS @   Physical Exam:  Vital signs in last 24 hours: Temp:  [97.4 F (36.3 C)-98.2 F (36.8 C)] 97.4 F (36.3 C) (05/20 0528) Pulse Rate:  [87-94] 94 (05/20 0528) Resp:   [15-18] 15 (05/20 0528) BP: (132-151)/(93-109) 151/109 mmHg (05/20 0528) SpO2:  [100 %] 100 % (05/20 0528) Weight:  [59.648 kg (131 lb 8 oz)] 59.648 kg (131 lb 8 oz) (05/20 0528) General:  Alert and oriented, No acute distress HEENT: Normocephalic, atraumatic Neck: No JVD or lymphadenopathy Cardiovascular: Regular rate and rhythm Lungs: Regular rate and effort Abdomen: Soft, nontender, mild distention  Back: No CVA tenderness Extremities: No edema Neurologic: Grossly intact  Laboratory Data:  Results for orders placed during the hospital encounter of 02/03/13 (from the past 24 hour(s))  PREALBUMIN     Status: Abnormal   Collection Time    02/08/13 12:00 PM      Result Value Range   Prealbumin 12.1 (*) 17.0 - 34.0 mg/dL  BASIC METABOLIC PANEL     Status: Abnormal   Collection Time    02/08/13 12:00 PM      Result Value Range   Sodium 143  135 - 145 mEq/L   Potassium 4.1  3.5 - 5.1 mEq/L   Chloride 107  96 - 112 mEq/L   CO2 25  19 - 32 mEq/L   Glucose, Bld 98  70 - 99 mg/dL   BUN 7  6 - 23 mg/dL   Creatinine, Ser 8.11  0.50 - 1.10 mg/dL   Calcium 91.4  8.4 - 78.2 mg/dL   GFR calc non Af Amer 89 (*) >90 mL/min   GFR calc Af Amer >90  >90 mL/min  PHOSPHORUS     Status: None   Collection Time    02/08/13 12:00 PM      Result Value Range   Phosphorus 3.4  2.3 - 4.6 mg/dL  MAGNESIUM     Status: None   Collection Time    02/08/13 12:00 PM      Result Value Range   Magnesium 1.9  1.5 - 2.5 mg/dL  GLUCOSE, CAPILLARY     Status: None   Collection Time    02/08/13  7:41 PM      Result Value Range   Glucose-Capillary 95  70 - 99 mg/dL  GLUCOSE, CAPILLARY     Status: None   Collection Time    02/08/13 11:30 PM      Result Value Range   Glucose-Capillary 99  70 - 99 mg/dL  GLUCOSE, CAPILLARY     Status: None   Collection Time    02/09/13  3:45 AM      Result Value Range   Glucose-Capillary 94  70 - 99 mg/dL  CBC     Status: Abnormal   Collection Time    02/09/13  6:22  AM      Result Value Range   WBC 7.7  4.0 - 10.5 K/uL   RBC 3.18 (*) 3.87 - 5.11 MIL/uL  Hemoglobin 9.2 (*) 12.0 - 15.0 g/dL   HCT 14.7 (*) 82.9 - 56.2 %   MCV 89.3  78.0 - 100.0 fL   MCH 28.9  26.0 - 34.0 pg   MCHC 32.4  30.0 - 36.0 g/dL   RDW 13.0  86.5 - 78.4 %   Platelets 500 (*) 150 - 400 K/uL  COMPREHENSIVE METABOLIC PANEL     Status: Abnormal   Collection Time    02/09/13  6:22 AM      Result Value Range   Sodium 143  135 - 145 mEq/L   Potassium 3.1 (*) 3.5 - 5.1 mEq/L   Chloride 107  96 - 112 mEq/L   CO2 25  19 - 32 mEq/L   Glucose, Bld 102 (*) 70 - 99 mg/dL   BUN 8  6 - 23 mg/dL   Creatinine, Ser 6.96  0.50 - 1.10 mg/dL   Calcium 9.9  8.4 - 29.5 mg/dL   Total Protein 6.1  6.0 - 8.3 g/dL   Albumin 2.5 (*) 3.5 - 5.2 g/dL   AST 15  0 - 37 U/L   ALT 10  0 - 35 U/L   Alkaline Phosphatase 90  39 - 117 U/L   Total Bilirubin 0.2 (*) 0.3 - 1.2 mg/dL   GFR calc non Af Amer >90  >90 mL/min   GFR calc Af Amer >90  >90 mL/min  DIFFERENTIAL     Status: None   Collection Time    02/09/13  6:22 AM      Result Value Range   Neutrophils Relative % 68  43 - 77 %   Lymphocytes Relative 18  12 - 46 %   Monocytes Relative 11  3 - 12 %   Eosinophils Relative 2  0 - 5 %   Basophils Relative 1  0 - 1 %   Neutro Abs 5.2  1.7 - 7.7 K/uL   Lymphs Abs 1.4  0.7 - 4.0 K/uL   Monocytes Absolute 0.8  0.1 - 1.0 K/uL   Eosinophils Absolute 0.2  0.0 - 0.7 K/uL   Basophils Absolute 0.1  0.0 - 0.1 K/uL   RBC Morphology ELLIPTOCYTES    CHOLESTEROL, TOTAL     Status: None   Collection Time    02/09/13  6:22 AM      Result Value Range   Cholesterol 64  0 - 200 mg/dL  TRIGLYCERIDES     Status: None   Collection Time    02/09/13  6:22 AM      Result Value Range   Triglycerides 71  <150 mg/dL   Recent Results (from the past 240 hour(s))  URINE CULTURE     Status: None   Collection Time    02/01/13  4:45 AM      Result Value Range Status   Specimen Description URINE, CLEAN CATCH   Final    Special Requests CX ADDED AT 0510 ON [id number]   Final   Culture  Setup Time 02/01/2013 11:31   Final   Colony Count 40,000 COLONIES/ML   Final   Culture     Final   Value: Multiple bacterial morphotypes present, none predominant. Suggest appropriate recollection if clinically indicated.   Report Status 02/02/2013 FINAL   Final  URINE CULTURE     Status: None   Collection Time    02/07/13  4:59 PM      Result Value Range Status   Specimen Description URINE, CLEAN CATCH  Final   Special Requests NONE   Final   Culture  Setup Time 02/07/2013 22:10   Final   Colony Count >=100,000 COLONIES/ML   Final   Culture     Final   Value: Multiple bacterial morphotypes present, none predominant. Suggest appropriate recollection if clinically indicated.   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 She still needs complete evaluation of the ureters and bladder. This can be accomplished with CT A/P with IV contrast and delayed images (CT Hematuria protocol) followed by outpatient cystoscopy or with cystoscopy and retrograde pyelograms in the OR.   If Dr. [name] thought stents before his procedure next week would be helpful, I could perform cystoscopy with bilateral retrogrades and stent placement before his procedure.  If not, please order the CT above and I'll see the patient back in the office for cystoscopy. Please let me know either way.   I gave the patient my card, contact information and discussed importance of follow-up.  CC: PA Barnetta Chapel   [facility] 02/09/2013, 7:49 AM  [phone]

## 2013-02-09 NOTE — Progress Notes (Signed)
I don't think she will be ready for planned surgery next week and I told her that.  Nutritionally she is not ready.  Will continue tna.  Her stoma is open but edematous and this is source of distention now.  She is taking liquids.  Will continue to digitalize daily and give enema today to see if helps.  Appreciate urology assistance.

## 2013-02-10 ENCOUNTER — Encounter (HOSPITAL_COMMUNITY): Admission: RE | Admit: 2013-02-10 | Payer: Medicaid Other | Source: Ambulatory Visit

## 2013-02-10 ENCOUNTER — Inpatient Hospital Stay (HOSPITAL_COMMUNITY): Payer: Medicaid Other

## 2013-02-10 ENCOUNTER — Encounter (HOSPITAL_COMMUNITY): Payer: Self-pay | Admitting: Radiology

## 2013-02-10 ENCOUNTER — Telehealth (HOSPITAL_COMMUNITY): Payer: Self-pay | Admitting: Emergency Medicine

## 2013-02-10 LAB — BASIC METABOLIC PANEL
CO2: 27 mEq/L (ref 19–32)
Calcium: 9 mg/dL (ref 8.4–10.5)
GFR calc Af Amer: >90 mL/min (ref >90)
GFR calc non Af Amer: >90 mL/min (ref >90)
Sodium: 141 mEq/L (ref 135–145)

## 2013-02-10 LAB — CBC
MCHC: 32.2 g/dL (ref 30.0–36.0)
Platelets: 436 10*3/uL — ABNORMAL HIGH (ref 150–400)
RDW: 15.7 % — ABNORMAL HIGH (ref 11.5–15.5)

## 2013-02-10 LAB — PHOSPHORUS: Phosphorus: 4.8 mg/dL — ABNORMAL HIGH (ref 2.3–4.6)

## 2013-02-10 LAB — MAGNESIUM: Magnesium: 1.6 mg/dL (ref 1.5–2.5)

## 2013-02-10 LAB — GLUCOSE, CAPILLARY: Glucose-Capillary: 98 mg/dL (ref 70–99)

## 2013-02-10 MED ORDER — TRACE MINERALS CR-CU-F-FE-I-MN-MO-SE-ZN IV SOLN
INTRAVENOUS | Status: AC
  Administered 2013-02-10: 18:00:00 via INTRAVENOUS
  Filled 2013-02-10: qty 2000

## 2013-02-10 MED ORDER — POTASSIUM CHLORIDE 10 MEQ/50ML IV SOLN
10.0000 meq | INTRAVENOUS | Status: AC
  Administered 2013-02-10 (×6): 10 meq via INTRAVENOUS
  Filled 2013-02-10 (×8): qty 50

## 2013-02-10 MED ORDER — FAT EMULSION 20 % IV EMUL
240.0000 mL | INTRAVENOUS | Status: AC
  Administered 2013-02-10: 240 mL via INTRAVENOUS
  Filled 2013-02-10: qty 250

## 2013-02-10 MED ORDER — IOHEXOL 300 MG/ML  SOLN
100.0000 mL | Freq: Once | INTRAMUSCULAR | Status: AC | PRN
  Administered 2013-02-10: 150 mL via INTRAVENOUS

## 2013-02-10 MED ORDER — MAGNESIUM SULFATE 40 MG/ML IJ SOLN
2.0000 g | Freq: Once | INTRAMUSCULAR | Status: AC
  Administered 2013-02-10: 2 g via INTRAVENOUS
  Filled 2013-02-10: qty 50

## 2013-02-10 NOTE — Progress Notes (Signed)
Colostomy with decreased edema, patent, will check ct per urology today will also see if colon still dilated, I don't think she can have surgery next week still

## 2013-02-10 NOTE — Consult Note (Signed)
WOC follow-up: Patient changed bag this am, denies needing assistance with pouching routine. CCS following for assessment and plan of care. Stoma is red and moist, appears slightly less swollen.  Pt reports little output from stoma, currently small amount of liquid stool in bag.  Supplies in room for patient to use.   Norva Karvonen RN, MSN Student Please re-consult if further assistance is needed.  Thank-you,  Cammie Mcgee MSN, RN, CWOCN, [place], CNS [phone]

## 2013-02-10 NOTE — Progress Notes (Signed)
Patient ID: Vanessa [name], female   DOB: [birth year], 37 y.o.   MRN: [id number] 7 Days Post-Op  Subjective: Pt feels ok today.  Still some nausea.  Doesn't want or care to eat.  Drinking ensure and tolerating it well.  No increase in output of ostomy with enema yesterday.  Objective: Vital signs in last 24 hours: Temp:  [97.9 F (36.6 C)-98.7 F (37.1 C)] 98.4 F (36.9 C) (05/21 0559) Pulse Rate:  [85-89] 85 (05/21 0559) Resp:  [16] 16 (05/21 0559) BP: (136-144)/(91-100) 138/98 mmHg (05/21 0559) SpO2:  [100 %] 100 % (05/21 0559) Last BM Date: 02/09/13  Intake/Output from previous day: 05/20 0701 - 05/21 0700 In: 570 [P.O.:570] Out: 2173 [Urine:2150; Drains:23] Intake/Output this shift:    PE: Abd: soft, but still distended and tender around her ostomy.  Digitalization was again performed and the stricturing was actually better today.  Drain with think brown output.  Lab Results:   Recent Labs  02/09/13 0622 02/10/13 0500  WBC 7.7 6.8  HGB 9.2* 8.7*  HCT 28.4* 27.0*  PLT 500* 436*   BMET  Recent Labs  02/09/13 0622 02/10/13 0500  NA 143 141  K 3.1* 3.0*  CL 107 104  CO2 25 27  GLUCOSE 102* 96  BUN 8 10  CREATININE 0.75 0.69  CALCIUM 9.9 9.0   PT/INR No results found for this basename: LABPROT, INR,  in the last 72 hours CMP     Component Value Date/Time   NA 141 02/10/2013 0500   K 3.0* 02/10/2013 0500   CL 104 02/10/2013 0500   CO2 27 02/10/2013 0500   GLUCOSE 96 02/10/2013 0500   BUN 10 02/10/2013 0500   CREATININE 0.69 02/10/2013 0500   CALCIUM 9.0 02/10/2013 0500   CALCIUM 10.7* 02/01/2012 0705   PROT 6.1 02/09/2013 0622   ALBUMIN 2.5* 02/09/2013 0622   AST 15 02/09/2013 0622   ALT 10 02/09/2013 0622   ALKPHOS 90 02/09/2013 0622   BILITOT 0.2* 02/09/2013 0622   GFRNONAA >90 02/10/2013 0500   GFRAA >90 02/10/2013 0500   Lipase     Component Value Date/Time   LIPASE 8* 02/01/2013 0210       Studies/Results: Dg Abd 2 Views  02-10-13    *RADIOLOGY REPORT*  Clinical Data: Prolapsing colostomy and abdominal distention.  ABDOMEN - 2 VIEW  Comparison: 02/01/2013  Findings: There is a left lower quadrant colostomy.  Left lower quadrant pigtail drainage catheter is identified in the projection of the left iliac bone.  There is moderate gaseous distention of the transverse colon.  A few prominent small bowel loops are noted which measure up to 2.8 cm.  IMPRESSION:  1.  Moderate gaseous distention of the transverse colon. 2.  Pigtail drainage catheter projects over the left iliac bone and is presumably within the left iliac fossa as before.   Original Report Authenticated By: Signa Kell, M.D.    Anti-infectives: Anti-infectives   Start     Dose/Rate Route Frequency Ordered Stop   02/03/13 1315  ertapenem (INVANZ) 1 g in sodium chloride 0.9 % 50 mL IVPB  Status:  Discontinued     1 g 100 mL/hr over 30 Minutes Intravenous  Once 02/03/13 1312 02/03/13 1650   02/03/13 0800  ertapenem (INVANZ) 1 g in sodium chloride 0.9 % 50 mL IVPB  Status:  Discontinued     1 g 100 mL/hr over 30 Minutes Intravenous Every 24 hours 02/03/13 0759 02/03/13 1702  Assessment/Plan   1. Prolapsed ostomy, s/p repair with recurrent non-ischemic prolapse  2. enterocolonic fistula, needs repair, scheduled for 02-16-13  3. Hypoalbuminemia 4. Bilateral lower extremity edema, secondary to #3  5. Hematuria  6. HTN  7. H/o anemia secondary to ABL from severe thrombocytopenia from sepsis, still with anemia  8. H/o cervical cancer, s/p hysterectomy and radiation  9. PCM/TNA (prealbumin 12)  10. Hypokalemia  Plan: 1. The patient's hgb continues to slowly trend down.  Will follow for now.  She does have a history of a rapid drop in platelets with diffuse bleeding a year ago.  Will follow closely. 2. Cont TNA  3.  Will go ahead and order CT hematuria protocol for evaluation.  Will d/w cystoscopy etc with Dr. [name] when he returns. 4. Replace K  LOS: 7 days     Ceferino Lang E 02/10/2013, 9:35 AM Pager: [phone]

## 2013-02-10 NOTE — Progress Notes (Signed)
Patient ID: Vanessa [name]  female  [id number]    DOB: 05/05/1976    DOA: 02/03/2013  PCP: Dorrene German, MD  Assessment/Plan:   Hypertension: BP stable - On norvasc and lisinopril - add hydralazine PRN.    Thrombocytosis: Likely reactive this appears be stable.   prolapsed ostomy, s/p repair with recurrent non-ischemic prolapse  - Per CCS  DVT Prophylaxis:  Code Status:  Disposition: per primary service    Subjective: No complaints per patient  Objective: Weight change:   Intake/Output Summary (Last 24 hours) at 02/10/13 1418 Last data filed at 02/10/13 0900  Gross per 24 hour  Intake    570 ml  Output   2173 ml  Net  -1603 ml   Blood pressure 138/98, pulse 85, temperature 98.4 F (36.9 C), temperature source Oral, resp. rate 16, height 5\' 4"  (1.626 m), weight 59.648 kg (131 lb 8 oz), SpO2 100.00%.  Physical Exam: General: Alert and awake, oriented x3, not in any acute distress. CVS: S1-S2 clear, no murmur rubs or gallops Chest: clear to auscultation bilaterally, no wheezing, rales or rhonchi Abdomen: soft mild distended and tender around her ostomy Extremities: no cyanosis, clubbing or edema noted bilaterally   Lab Results: Basic Metabolic Panel:  Recent Labs Lab 02/09/13 0622 02/10/13 0500  NA 143 141  K 3.1* 3.0*  CL 107 104  CO2 25 27  GLUCOSE 102* 96  BUN 8 10  CREATININE 0.75 0.69  CALCIUM 9.9 9.0  MG  --  1.6  PHOS  --  4.8*   Liver Function Tests:  Recent Labs Lab 02/09/13 0622  AST 15  ALT 10  ALKPHOS 90  BILITOT 0.2*  PROT 6.1  ALBUMIN 2.5*   No results found for this basename: LIPASE, AMYLASE,  in the last 168 hours No results found for this basename: AMMONIA,  in the last 168 hours CBC:  Recent Labs Lab 02/09/13 0622 02/10/13 0500  WBC 7.7 6.8  NEUTROABS 5.2  --   HGB 9.2* 8.7*  HCT 28.4* 27.0*  MCV 89.3 90.0  PLT 500* 436*   Cardiac Enzymes: No results found for this basename: CKTOTAL, CKMB, CKMBINDEX,  TROPONINI,  in the last 168 hours BNP: No components found with this basename: POCBNP,  CBG:  Recent Labs Lab 02/09/13 1941 02/09/13 2327 02/10/13 0334 02/10/13 0737 02/10/13 1154  GLUCAP 118* 117* 119* 102* 117*     Micro Results: Recent Results (from the past 240 hour(s))  URINE CULTURE     Status: None   Collection Time    02/01/13  4:45 AM      Result Value Range Status   Specimen Description URINE, CLEAN CATCH   Final   Special Requests CX ADDED AT 0510 ON [id number]   Final   Culture  Setup Time 02/01/2013 11:31   Final   Colony Count 40,000 COLONIES/ML   Final   Culture     Final   Value: Multiple bacterial morphotypes present, none predominant. Suggest appropriate recollection if clinically indicated.   Report Status 02/02/2013 FINAL   Final  URINE CULTURE     Status: None   Collection Time    02/07/13  4:59 PM      Result Value Range Status   Specimen Description URINE, CLEAN CATCH   Final   Special Requests NONE   Final   Culture  Setup Time 02/07/2013 22:10   Final   Colony Count >=100,000 COLONIES/ML   Final   Culture  Final   Value: Multiple bacterial morphotypes present, none predominant. Suggest appropriate recollection if clinically indicated.   Report Status 02/08/2013 FINAL   Final    Studies/Results: Ct Abdomen Pelvis W Contrast  01/28/2013   *RADIOLOGY REPORT*  Clinical Data: Left lower quadrant abdominal pain with lower extremity and abdominal edema.  Left lower quadrant drain placed 7 months ago.  History of cervical cancer, radiation and chemotherapy, sigmoid colectomy with ostomy.  CT ABDOMEN AND PELVIS WITH CONTRAST  Technique:  Multidetector CT imaging of the abdomen and pelvis was performed following the standard protocol during bolus administration of intravenous contrast.  Contrast: 80mL OMNIPAQUE IOHEXOL 300 MG/ML  SOLN  Comparison: Abdominal pelvic CT 12/03/2012.  Barium enema 01/18/2013.  Findings: Images through the lung bases demonstrate  stable cardiomegaly and linear scarring or atelectasis at both lung bases. There are trace bilateral pleural effusions.  There is mild ascites with diffuse soft tissue edema consistent with anasarca.  The abdomen is imaged prior to portal vein opacification.  There is some retrograde filling of the IVC and hepatic veins.  The hepatic steatosis has improved compared with the most recent study.  The previously noted low density lesions in the right and left hepatic lobes are grossly stable. The gallbladder is surgically absent. There is a stable nonobstructing calculus in the lower pole of the left kidney.  There is no hydronephrosis.  The right kidney, spleen, pancreas and adrenal glands appear normal.  Descending colostomy and left lower quadrant percutaneous drain appear unchanged.  Evaluation of the pelvic anatomy is limited by the anasarca and arterial phase imaging.  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 There is likely a small amount of residual extraluminal air and fluid in the left lower quadrant inferior and anterior to the surgical drain.  This appears grossly unchanged.  There is diffuse bladder wall thickening.  There is diffuse thickening of the wall of the rectosigmoid colon and perirectal soft tissue stranding. There is still some air within the vagina, but no contrast.  There are no worrisome osseous findings.  IMPRESSION:  1.  Progressive anasarca with diffuse soft tissue edema, ascites and trace pleural effusions. 2.  Cardiomegaly with reflux of contrasted blood into the IVC and hepatic veins suggesting a degree of right heart failure. 3.  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  No  large drainable collection identified.   Original Report Authenticated By: Carey Bullocks, M.D.   Dg Abd 2 Views  02/08/2013   *RADIOLOGY REPORT*  Clinical Data: Prolapsing colostomy and abdominal distention.  ABDOMEN - 2 VIEW  Comparison: 02/01/2013  Findings: There is a left lower quadrant colostomy.  Left lower quadrant pigtail drainage catheter is identified in the projection of the left iliac bone.  There is moderate gaseous distention of the transverse colon.  A few prominent small bowel loops are noted which measure up to 2.8 cm.  IMPRESSION:  1.  Moderate gaseous distention of the transverse colon. 2.  Pigtail drainage catheter projects over the left iliac bone and is presumably within the left iliac fossa as before.   Original Report Authenticated By: Signa Kell, M.D.   Dg Abd Acute W/chest  02/01/2013   *RADIOLOGY REPORT*  Clinical Data: Abdominal pain  ACUTE ABDOMEN SERIES (ABDOMEN 2 VIEW & CHEST 1 VIEW)  Comparison: 01/28/2013 CT  Findings: Cardiomegaly.  Central vascular congestion. No focal consolidation.  Surgical clips right upper quadrant.  No free intraperitoneal air. Surgical drain projects over the left lower quadrant.  There is air within normal caliber colon and a small amount of contrast within the left lower colon projecting just above the colostomy. There is also high attenuation over the colostomy, most in keeping with ingested contrast from the recent comparison. 2 small nonspecific metallic densities project over the mid pelvis. Gentle leftward curvature of the spine.  No acute osseous finding.  IMPRESSION: Surgical drain projects over the left lower abdomen/upper pelvis.  Nonspecific bowel gas pattern without overt obstruction.   Original Report Authenticated By: Jearld Lesch, M.D.   Dg Colon W/cm - Wo/w Kub  01/18/2013   *RADIOLOGY REPORT*  Clinical Data: No complex surgical history.  The patient has a cervical cancer radiation therapy the pelvis.  Subsequent a diverting  colostomy and Hartmann's pouch.  Persistent drainage of stool through the Hartmann's pouch.  The patient additionally has a percutaneous drain in the pelvis.  The  SINGLE COLUMN BARIUM ENEMA  Technique:  Initial scout AP supine abdominal image obtained to insure adequate colon cleansing.  Barium was introduced into the colon in a retrograde fashion and refluxed from the rectum to the cecum. Spot images of the colon followed by overhead radiographs were obtained.  Fluoroscopy time: 2 minutes 29 seconds  Comparison:  CT 12/03/2012, and fistulogram, 08/25/2012, small bowel follow through the 09/20/2012  Findings:  A Foley catheter was inserted per rectum and 5 cc  gas insufflated into the retention balloon.  Under fluoroscopic observation are soluble contrast was administered via gravity feed per rectum.  Contrast filled the Hartmann's pouch.  Contrast was quickly seen within the cecum.  Contrast was also quickly seen within the distal small bowel.  Contrast flowed antegrade into the ascending and transverse colon to the level of the splenic flexure. The is high density contrast within the terminal ileum.  No significant contrast flowed into the percutaneous drain.  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  Findings discussed with Dr. [name] on 01/18/2013.   Original Report Authenticated By: Genevive Bi, M.D.    Medications: Scheduled Meds: . amLODipine  2.5 mg Oral Daily  . enoxaparin (LOVENOX) injection  40 mg Subcutaneous Q24H  . feeding supplement  237 mL Oral BID BM  . lisinopril  10 mg Oral Daily  . pantoprazole  40 mg Oral QHS  . potassium chloride  10 mEq Intravenous Q1 Hr x 6  . traMADol  50 mg Oral Q6H      LOS: 7 days   RAI,RIPUDEEP M.D. [facility] 02/10/2013, 2:18 PM Pager:  [phone]  If 7PM-7AM, please contact night-coverage [email or web address] Password [name]

## 2013-02-10 NOTE — Progress Notes (Signed)
PARENTERAL NUTRITION CONSULT NOTE - INITIAL  Pharmacy Consult for TPN Indication: possible obstruction, complex GI history with pending fistula repair  Allergies  Allergen Reactions  . Penicillins Other (See Comments)    "anything w/penicillin in it makes me bleed"  . Labetalol Hcl Itching and Other (See Comments)    Bumps to bilateral arms.  . Morphine And Related Hives    Patient Measurements: Height: 5\' 4"  (162.6 cm) Weight: 131 lb 8 oz (59.648 kg) IBW/kg (Calculated) : 54.7  Vital Signs: Temp: 98.4 F (36.9 C) (05/21 0559) Temp src: Oral (05/21 0559) BP: 138/98 mmHg (05/21 0559) Pulse Rate: 85 (05/21 0559) Intake/Output from previous day: 05/20 0701 - 05/21 0700 In: 570 [P.O.:570] Out: 2173 [Urine:2150; Drains:23] Intake/Output from this shift:    Labs:  Recent Labs  02/07/13 1704 02/09/13 0622 02/10/13 0500  WBC 9.0 7.7 6.8  HGB 10.5* 9.2* 8.7*  HCT 31.5* 28.4* 27.0*  PLT 546* 500* 436*     Recent Labs  02/08/13 1200 02/09/13 0622 02/10/13 0500  NA 143 143 141  K 4.1 3.1* 3.0*  CL 107 107 104  CO2 25 25 27   GLUCOSE 98 102* 96  BUN 7 8 10   CREATININE 0.83 0.75 0.69  CALCIUM 10.3 9.9 9.0  MG 1.9  --  1.6  PHOS 3.4  --  4.8*  PROT  --  6.1  --   ALBUMIN  --  2.5*  --   AST  --  15  --   ALT  --  10  --   ALKPHOS  --  90  --   BILITOT  --  0.2*  --   PREALBUMIN 12.1* 11.7*  --   TRIG  --  71  --   CHOL  --  64  --    Estimated Creatinine Clearance: 83.1 ml/min (by C-G formula based on Cr of 0.69).    Recent Labs  02/09/13 2327 02/10/13 0334 02/10/13 0737  GLUCAP 117* 119* 102*    Medical History: Past Medical History  Diagnosis Date  . History of blood transfusion     "I had ~ 7 in 01/2012"  . Anemia   . Cervical cancer ~ 2011    cervical  . Radiation Nov.18,2011-Jan.23,2012    cervix  . Hypertension     no treatment necessary for BP  since July 2013    Insulin Requirements in the past 24 hours:  None  Current  Nutrition:  Full liquid diet- noted 50% intake of breakfast yesterday and this morning, also receiving Ensure BID BM; Clinimix E 5/15 at goal rate of 64mL/hr- provides 95g of protein/day and average of 1560kcal/day with fats given 3x week. Would meet 100% of protein goal and 86% of kcal goal  Assessment: Patient known to pharmacy from past TPN consults. She has a prolapsed ostomy which is s/p repair and is awaiting a fistula repair (scheduled for 5/27). Patient has extensive GI history. Now being worked up for possible obstruction. Through conversation with RD and surgery PA, desire TPN at this point to ensure patient stays nutritionally stable prior to procedures. Per surgery's note 5/20, she is not nutritionally ready for her planned procedure next week  Nutritional Goals:  1800-1920 kCal, 80-95 grams of protein per day per RD recommendations 5/19  GI: s/p prolapsed colectomy revision, appears no obstruction, some BS and abd is soft. PO PPI  Endo: no hx, has not required insulin in her TPN in the past; CBGs 88-119 in the  last 24 hours (all taken while on TPN)  Lytes: K dropped to 3 (note, patient received Lasix IV BID x2 days which has now been D/C'd), phos elevated at 4.8, mag fell slightly to 1.6, corrected Ca good at 10.2, all other lytes nml  Renal: SCr 0.69, CrCl ~78mL/min; has half NS running at 10-25mL/hr  Pulm: RA  Cards: BP remains sl elevated, HR nml- lisinopril, amlodipine, prn hydralazine  Hepatobil: alkphos, AST, ALT all nml, albumin low at 2.5, TBili low at 0.2; baseline prealbumin 12.1; baseline trigs 71  Neuro: no issues  ID: WBC 6.8, afebrile, no abx  Best Practices: Lovenox 40  TPN Access: PICC placed 5/19 TPN day#: 2 (started 5/19)  Plan:  - will give 6 IV runs of KCl - will give 2g Mag IV x1 - Will continue Clinimix E 5/15 at goal rate of 61mL/hr- if phos continues to increase, will need to remove electrolytes from TPN formula - will provide MVI/trace and IV  Lipids MWF only due to ongoing shortages - will D/C SSI/CBG - will continue IVF as half NS plain at 10-10mL/hr - will follow up diet progression - CMET, phos and mag ordered for tomorrow morning  Daiquan Resnik D. Taisia Fantini, PharmD Clinical Pharmacist Pager: [phone] 02/10/2013 8:50 AM

## 2013-02-11 DIAGNOSIS — R609 Edema, unspecified: Secondary | ICD-10-CM

## 2013-02-11 DIAGNOSIS — R600 Localized edema: Secondary | ICD-10-CM | POA: Diagnosis present

## 2013-02-11 DIAGNOSIS — M79609 Pain in unspecified limb: Secondary | ICD-10-CM

## 2013-02-11 DIAGNOSIS — I2789 Other specified pulmonary heart diseases: Secondary | ICD-10-CM

## 2013-02-11 DIAGNOSIS — I10 Essential (primary) hypertension: Secondary | ICD-10-CM

## 2013-02-11 DIAGNOSIS — I5041 Acute combined systolic (congestive) and diastolic (congestive) heart failure: Secondary | ICD-10-CM

## 2013-02-11 DIAGNOSIS — I272 Pulmonary hypertension, unspecified: Secondary | ICD-10-CM | POA: Diagnosis present

## 2013-02-11 LAB — COMPREHENSIVE METABOLIC PANEL
Alkaline Phosphatase: 78 U/L (ref 39–117)
BUN: 13 mg/dL (ref 6–23)
CO2: 25 mEq/L (ref 19–32)
Chloride: 102 mEq/L (ref 96–112)
Creatinine, Ser: 0.7 mg/dL (ref 0.50–1.10)
GFR calc non Af Amer: >90 mL/min (ref >90)
Total Bilirubin: 0.1 mg/dL — ABNORMAL LOW (ref 0.3–1.2)

## 2013-02-11 LAB — PRO B NATRIURETIC PEPTIDE: Pro B Natriuretic peptide (BNP): 2330 pg/mL — ABNORMAL HIGH (ref 0–125)

## 2013-02-11 LAB — PHOSPHORUS: Phosphorus: 3.8 mg/dL (ref 2.3–4.6)

## 2013-02-11 LAB — CBC
HCT: 28.3 % — ABNORMAL LOW (ref 36.0–46.0)
MCH: 29 pg (ref 26.0–34.0)
MCHC: 32.2 g/dL (ref 30.0–36.0)
RDW: 15.7 % — ABNORMAL HIGH (ref 11.5–15.5)

## 2013-02-11 LAB — MAGNESIUM: Magnesium: 1.8 mg/dL (ref 1.5–2.5)

## 2013-02-11 MED ORDER — POTASSIUM CHLORIDE CRYS ER 20 MEQ PO TBCR
40.0000 meq | EXTENDED_RELEASE_TABLET | Freq: Two times a day (BID) | ORAL | Status: AC
  Administered 2013-02-11 (×2): 40 meq via ORAL
  Filled 2013-02-11 (×2): qty 2

## 2013-02-11 MED ORDER — AMLODIPINE BESYLATE 5 MG PO TABS: 5.0000 mg | ORAL_TABLET | Freq: Every day | ORAL | Status: DC

## 2013-02-11 MED ORDER — METOPROLOL TARTRATE 12.5 MG HALF TABLET
12.5000 mg | ORAL_TABLET | Freq: Two times a day (BID) | ORAL | Status: DC
  Administered 2013-02-11 – 2013-02-12 (×2): 12.5 mg via ORAL
  Filled 2013-02-11 (×3): qty 1

## 2013-02-11 MED ORDER — POTASSIUM CHLORIDE CRYS ER 20 MEQ PO TBCR
40.0000 meq | EXTENDED_RELEASE_TABLET | Freq: Once | ORAL | Status: AC
  Administered 2013-02-11: 40 meq via ORAL
  Filled 2013-02-11: qty 2

## 2013-02-11 MED ORDER — FUROSEMIDE 10 MG/ML IJ SOLN
20.0000 mg | Freq: Two times a day (BID) | INTRAMUSCULAR | Status: DC
  Administered 2013-02-11: 20 mg via INTRAVENOUS
  Filled 2013-02-11 (×4): qty 2

## 2013-02-11 MED ORDER — MAGNESIUM SULFATE 40 MG/ML IJ SOLN
2.0000 g | Freq: Once | INTRAMUSCULAR | Status: AC
  Administered 2013-02-11: 2 g via INTRAVENOUS
  Filled 2013-02-11: qty 50

## 2013-02-11 MED ORDER — CLINIMIX E/DEXTROSE (5/15) 5 % IV SOLN
INTRAVENOUS | Status: AC
  Administered 2013-02-11: 17:00:00 via INTRAVENOUS
  Filled 2013-02-11: qty 2000

## 2013-02-11 MED ORDER — POTASSIUM CHLORIDE 10 MEQ/50ML IV SOLN
10.0000 meq | INTRAVENOUS | Status: DC
  Administered 2013-02-11 (×2): 10 meq via INTRAVENOUS
  Filled 2013-02-11 (×7): qty 50

## 2013-02-11 MED ORDER — ENOXAPARIN SODIUM 60 MG/0.6ML ~~LOC~~ SOLN
60.0000 mg | Freq: Two times a day (BID) | SUBCUTANEOUS | Status: DC
  Administered 2013-02-11 – 2013-02-14 (×6): 60 mg via SUBCUTANEOUS
  Filled 2013-02-11 (×8): qty 0.6

## 2013-02-11 MED ORDER — ISOSORB DINITRATE-HYDRALAZINE 20-37.5 MG PO TABS
1.0000 | ORAL_TABLET | Freq: Two times a day (BID) | ORAL | Status: DC
  Administered 2013-02-11 – 2013-02-19 (×16): 1 via ORAL
  Filled 2013-02-11 (×17): qty 1

## 2013-02-11 NOTE — Progress Notes (Signed)
Patient ID: Vanessa MANSEAU  female  WRU:[id number]    DOB: 1976-09-14    DOA: 02/03/2013  PCP: Dorrene German, MD  Assessment/Plan:   Hypertension: BP stable - Increased norvasc, continue lisinopril - add hydralazine PRN.   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 Albumin level 2.3  - Patient has clear lung examination, negative balance on I/O's - Rule out DVT - On amlodipine and lisinopril, I have discussed with the [facility] cardiology to see the patient.    Mild bilateral hydronephrosis: Renal function is normal  - Agree with urology consultation  Thrombocytosis: Likely reactive this appears be stable.   prolapsed ostomy, s/p repair with recurrent non-ischemic prolapse  - Per CCS  DVT Prophylaxis:  Code Status:  Disposition: per primary service    Subjective: Eating fine, has some orthopnea on lying flat, + peripheral edema, BP stable  Objective: Weight change:   Intake/Output Summary (Last 24 hours) at 02/11/13 1323 Last data filed at 02/11/13 0703  Gross per 24 hour  Intake 2249.5 ml  Output   1305 ml  Net  944.5 ml   Blood pressure 124/93, pulse 81, temperature 98 F (36.7 C), temperature source Oral, resp. rate 18, height 5\' 4"  (1.626 m), weight 59.648 kg (131 lb 8 oz), SpO2 100.00%.  Physical Exam: General: Alert and awake, oriented x3, NADs. CVS: S1-S2 clear, no murmur rubs or gallops Chest: CTA b/l, no wheezing, rales or rhonchi Abdomen: soft mild distended and tender around her ostomy Extremities: no cyanosis, clubbing. 3+ pitting edema noted bilaterally   Lab Results: Basic Metabolic Panel:  Recent Labs Lab 02/10/13 0500 02/11/13 0525  NA 141 138  K 3.0* 3.1*  CL 104 102  CO2 27 25  GLUCOSE 96 100*  BUN 10 13  CREATININE 0.69 0.70  CALCIUM 9.0 9.6  MG 1.6 1.8  PHOS 4.8* 3.8    Liver Function Tests:  Recent Labs Lab 02/09/13 0622 02/11/13 0525  AST 15 15  ALT 10 8  ALKPHOS 90 78  BILITOT 0.2* 0.1*  PROT 6.1 6.0  ALBUMIN 2.5* 2.3*   No results found for this basename: LIPASE, AMYLASE,  in the last 168 hours No results found for this basename: AMMONIA,  in the last 168 hours CBC:  Recent Labs Lab 02/09/13 0622 02/10/13 0500 02/11/13 0525  WBC 7.7 6.8 6.6  NEUTROABS 5.2  --   --   HGB 9.2* 8.7* 9.1*  HCT 28.4* 27.0* 28.3*  MCV 89.3 90.0 90.1  PLT 500* 436* 434*   Cardiac Enzymes: No results found for this basename: CKTOTAL, CKMB, CKMBINDEX, TROPONINI,  in the last 168 hours BNP: No components found with this basename: POCBNP,  CBG:  Recent Labs Lab 02/09/13 2327 02/10/13 0334 02/10/13 0737 02/10/13 1154 02/10/13 1814  GLUCAP 117* 119* 102* 117* 98     Micro Results: Recent Results (from the past 240 hour(s))  URINE CULTURE     Status: None   Collection Time    02/07/13  4:59 PM      Result Value Range Status   Specimen Description URINE, CLEAN CATCH   Final   Special Requests NONE   Final   Culture  Setup Time 02/07/2013 22:10   Final   Colony Count >=100,000 COLONIES/ML   Final   Culture     Final   Value: Multiple bacterial morphotypes present, none predominant. Suggest appropriate  recollection if clinically indicated.   Report Status 02/08/2013 FINAL   Final    Studies/Results: Ct Abdomen Pelvis W Contrast  01/28/2013   *RADIOLOGY REPORT*  Clinical Data: Left lower quadrant abdominal pain with lower extremity and abdominal edema.  Left lower quadrant drain placed 7 months ago.  History of cervical cancer, radiation and chemotherapy, sigmoid colectomy with ostomy.  CT ABDOMEN AND PELVIS WITH CONTRAST  Technique:  Multidetector CT imaging of the abdomen and pelvis was performed following the standard protocol during bolus administration of intravenous contrast.  Contrast: 80mL OMNIPAQUE IOHEXOL 300 MG/ML  SOLN  Comparison:  Abdominal pelvic CT 12/03/2012.  Barium enema 01/18/2013.  Findings: Images through the lung bases demonstrate stable cardiomegaly and linear scarring or atelectasis at both lung bases. There are trace bilateral pleural effusions.  There is mild ascites with diffuse soft tissue edema consistent with anasarca.  The abdomen is imaged prior to portal vein opacification.  There is some retrograde filling of the IVC and hepatic veins.  The hepatic steatosis has improved compared with the most recent study.  The previously noted low density lesions in the right and left hepatic lobes are grossly stable. The gallbladder is surgically absent. There is a stable nonobstructing calculus in the lower pole of the left kidney.  There is no hydronephrosis.  The right kidney, spleen, pancreas and adrenal glands appear normal.  Descending colostomy and left lower quadrant percutaneous drain appear unchanged.  Evaluation of the pelvic anatomy is limited by the anasarca and arterial phase imaging.  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 There is likely a small amount of residual extraluminal air and fluid in the left lower quadrant inferior and anterior to the surgical drain.  This appears grossly unchanged.  There is diffuse bladder wall thickening.  There is diffuse thickening of the wall of the rectosigmoid colon and perirectal soft tissue stranding. There is still some air within the vagina, but no contrast.  There are no worrisome osseous findings.  IMPRESSION:  1.  Progressive anasarca with diffuse soft tissue edema, ascites and trace pleural effusions. 2.  Cardiomegaly with reflux of contrasted blood into the IVC and hepatic veins suggesting a degree of right heart failure. 3.  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  No large drainable collection identified.   Original Report Authenticated By: Carey Bullocks, M.D.   Dg Abd 2 Views  02/08/2013   *RADIOLOGY REPORT*  Clinical Data: Prolapsing colostomy and abdominal distention.  ABDOMEN - 2 VIEW  Comparison: 02/01/2013  Findings: There is a left lower quadrant colostomy.  Left lower quadrant pigtail drainage catheter is identified in the projection of the left iliac bone.  There is moderate gaseous distention of the transverse colon.  A few prominent small bowel loops are noted which measure up to 2.8 cm.  IMPRESSION:  1.  Moderate gaseous distention of the transverse colon. 2.  Pigtail drainage catheter projects over the left iliac bone and is presumably within the left iliac fossa as before.   Original Report Authenticated By: Signa Kell, M.D.   Dg Abd Acute W/chest  02/01/2013   *RADIOLOGY REPORT*  Clinical Data: Abdominal pain  ACUTE ABDOMEN SERIES (ABDOMEN 2 VIEW & CHEST 1 VIEW)  Comparison: 01/28/2013 CT  Findings: Cardiomegaly.  Central vascular congestion. No focal consolidation.  Surgical clips right upper quadrant.  No free intraperitoneal air. Surgical drain projects over the left lower quadrant.  There is air within normal caliber colon and a small amount of contrast within the left lower colon projecting just above the colostomy. There is also high attenuation over the colostomy, most in keeping with ingested contrast from the recent comparison. 2 small nonspecific metallic densities project over the mid pelvis. Gentle leftward curvature of the spine.  No acute osseous finding.  IMPRESSION: Surgical drain projects over the left lower abdomen/upper pelvis.  Nonspecific bowel gas pattern without overt obstruction.   Original Report Authenticated By: Jearld Lesch, M.D.   Dg Colon W/cm - Wo/w Kub  01/18/2013   *RADIOLOGY REPORT*  Clinical Data:  No complex surgical history.  The patient has a cervical cancer radiation therapy the pelvis.  Subsequent a diverting colostomy and Hartmann's pouch.  Persistent drainage of stool through the Hartmann's pouch.  The patient additionally has a percutaneous drain in the pelvis.  The  SINGLE COLUMN BARIUM ENEMA  Technique:  Initial scout AP supine abdominal image obtained to insure adequate colon cleansing.  Barium was introduced into the colon in a retrograde fashion and refluxed from the rectum to the cecum. Spot images of the colon followed by overhead radiographs were obtained.  Fluoroscopy time: 2 minutes 29 seconds  Comparison:  CT 12/03/2012, and fistulogram, 08/25/2012, small bowel follow through the 09/20/2012  Findings:  A Foley catheter was inserted per rectum and 5 cc  gas insufflated into the retention balloon.  Under fluoroscopic observation are soluble contrast was administered via gravity feed per rectum.  Contrast filled the Hartmann's pouch.  Contrast was quickly seen within the cecum.  Contrast was also quickly seen within the distal small bowel.  Contrast flowed antegrade into the ascending and transverse colon to the level of the splenic flexure. The is high density contrast within the terminal ileum.  No significant contrast flowed into the percutaneous drain.  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  Findings discussed with Dr. [name] on 01/18/2013.   Original Report Authenticated By: Genevive Bi, M.D.    Medications: Scheduled Meds: . amLODipine  2.5 mg Oral Daily  . enoxaparin (LOVENOX) injection  40 mg Subcutaneous Q24H  . feeding supplement  237 mL Oral BID BM  . lisinopril  10 mg Oral Daily  . magnesium sulfate 1 - 4 g bolus IVPB  2 g Intravenous Once  . pantoprazole  40 mg Oral QHS  .  potassium chloride  40 mEq Oral BID  . traMADol  50 mg Oral Q6H      LOS: 8 days   Paige Vanderwoude M.D. [facility] 02/11/2013, 1:23 PM Pager: [phone]  If 7PM-7AM, please contact night-coverage [email or web address] Password [name]

## 2013-02-11 NOTE — Progress Notes (Signed)
I agree with above, urology to follow up, cont tna, i still think she will need to wait for her surgery with dr wyatt

## 2013-02-11 NOTE — Progress Notes (Signed)
Agree with above 

## 2013-02-11 NOTE — Progress Notes (Signed)
VASCULAR LAB PRELIMINARY  PRELIMINARY  PRELIMINARY  PRELIMINARY  Bilateral lower extremity venous Dopplers completed.    Preliminary report:  There appears to be non occlusive, age indeterminate DVT isolated to the confluence of the common femoral and femoral veins of the right lower extremity.  All other veins appear thrombus free.   Robbert Langlinais, RVT 02/11/2013, 3:35 PM

## 2013-02-11 NOTE — Progress Notes (Signed)
PARENTERAL NUTRITION CONSULT NOTE - INITIAL  Pharmacy Consult for TPN Indication: complex GI history with pending fistula repair  Allergies  Allergen Reactions  . Penicillins Other (See Comments)    "anything w/penicillin in it makes me bleed"  . Labetalol Hcl Itching and Other (See Comments)    Bumps to bilateral arms.  . Morphine And Related Hives    Patient Measurements: Height: 5\' 4"  (162.6 cm) Weight: 131 lb 8 oz (59.648 kg) IBW/kg (Calculated) : 54.7  Vital Signs: Temp: 98 F (36.7 C) (05/22 0545) Temp src: Oral (05/22 0545) BP: 124/93 mmHg (05/22 0545) Pulse Rate: 81 (05/22 0545) Intake/Output from previous day: 05/21 0701 - 05/22 0700 In: 2489.5 [P.O.:1050; I.V.:319; TPN:1120.5] Out: 1305 [Urine:1300; Drains:5] Intake/Output from this shift:    Labs:  Recent Labs  02/09/13 0622 02/10/13 0500 02/11/13 0525  WBC 7.7 6.8 6.6  HGB 9.2* 8.7* 9.1*  HCT 28.4* 27.0* 28.3*  PLT 500* 436* 434*     Recent Labs  02/08/13 1200 02/09/13 0622 02/10/13 0500 02/11/13 0525  NA 143 143 141 138  K 4.1 3.1* 3.0* 3.1*  CL 107 107 104 102  CO2 25 25 27 25   GLUCOSE 98 102* 96 100*  BUN 7 8 10 13   CREATININE 0.83 0.75 0.69 0.70  CALCIUM 10.3 9.9 9.0 9.6  MG 1.9  --  1.6 1.8  PHOS 3.4  --  4.8* 3.8  PROT  --  6.1  --  6.0  ALBUMIN  --  2.5*  --  2.3*  AST  --  15  --  15  ALT  --  10  --  8  ALKPHOS  --  90  --  78  BILITOT  --  0.2*  --  0.1*  PREALBUMIN 12.1* 11.7*  --   --   TRIG  --  71  --   --   CHOL  --  64  --   --    Estimated Creatinine Clearance: 83.1 ml/min (by C-G formula based on Cr of 0.7).    Recent Labs  02/10/13 0737 02/10/13 1154 02/10/13 1814  GLUCAP 102* 117* 98    Medical History: Past Medical History  Diagnosis Date  . History of blood transfusion     "I had ~ 7 in 01/2012"  . Anemia   . Cervical cancer ~ 2011    cervical  . Radiation Nov.18,2011-Jan.23,2012    cervix  . Hypertension     no treatment necessary for BP   since July 2013    Insulin Requirements in the past 24 hours:  None- SSI has been discontinued  Current Nutrition:  Full liquid diet- noted 75% intake of breakfast and 100% of lunch yesterday, also receiving Ensure BID BM; Clinimix E 5/15 at goal rate of 21mL/hr- provides 95g of protein/day and average of 1560kcal/day with fats given 3x week. Would meet 100% of protein goal and 86% of kcal goal  Assessment: Patient known to pharmacy from past TPN consults. She has a prolapsed ostomy which is s/p repair and is awaiting a fistula repair (scheduled for 5/27). Patient has extensive GI history. Was being worked up for possible obstruction but there does not appear to be one. Through conversation with RD and surgery PA, desire TPN at this point to ensure patient stays nutritionally stable prior to future procedures. Surgery does not think she is nutritionally stable for surgery 5/27 yet.  Nutritional Goals:  1800-1920 kCal, 80-95 grams of protein per day per  RD recommendations 5/19  GI: s/p prolapsed colectomy revision, appears no obstruction, some BS and abd is soft. PO PPI  Endo: no hx, has not required insulin in her TPN in the past  Lytes: K still low at 3.1, phos decreased to normal range at 3.8, mag 1.8, corrected Ca 10.9, Ca x phos product <55, other lytes nml  Renal: SCr 0.7, CrCl ~73mL/min; has half NS running at 10-50mL/hr  Pulm: RA  Cards: BP improved, HR nml- lisinopril, amlodipine, prn hydralazine  Hepatobil: alkphos, AST, ALT all nml, albumin low at 2.3, TBili low at 0.1; baseline prealbumin 12.1, recheck 11.7 (only 1 day after baseline check); baseline trigs 71  Neuro: no issues  ID: WBC 6.6, afebrile, no abx  Best Practices: Lovenox 40  TPN Access: PICC placed 5/19 TPN day#: 2 (started 5/19)  Plan:  - ordered 6 IV runs of KCl- 2 given, then changed to PO to finish repletion - will give 2g IV mag x1 - Will continue Clinimix E 5/15 at goal rate of 78mL/hr- continue to  watch phos, if it increases again, will likely need to change to formula without electrolytes - will provide MVI/trace and IV Lipids MWF only due to ongoing shortages - will continue IVF as half NS at 10-34mL/hr - will follow up diet progression - BMET and mag ordered for the morning  Laretta Pyatt D. Shaelin Lalley, PharmD Clinical Pharmacist Pager: [phone] 02/11/2013 10:09 AM

## 2013-02-11 NOTE — Progress Notes (Signed)
ANTICOAGULATION CONSULT NOTE - Initial Consult  Pharmacy Consult for Lovenox Indication: DVT  Allergies  Allergen Reactions  . Penicillins Other (See Comments)    "anything w/penicillin in it makes me bleed"  . Labetalol Hcl Itching and Other (See Comments)    Bumps to bilateral arms.  . Morphine And Related Hives    Patient Measurements: Height: 5\' 4"  (162.6 cm) Weight: 131 lb 8 oz (59.648 kg) IBW/kg (Calculated) : 54.7  Vital Signs: Temp: 98.4 F (36.9 C) (05/22 1419) Temp src: Oral (05/22 1419) BP: 148/99 mmHg (05/22 1419) Pulse Rate: 90 (05/22 1419)  Labs:  Recent Labs  02/09/13 0622 02/10/13 0500 02/11/13 0525  HGB 9.2* 8.7* 9.1*  HCT 28.4* 27.0* 28.3*  PLT 500* 436* 434*  CREATININE 0.75 0.69 0.70    Estimated Creatinine Clearance: 83.1 ml/min (by C-G formula based on Cr of 0.7).   Medical History: Past Medical History  Diagnosis Date  . History of blood transfusion     "I had ~ 7 in 01/2012"  . Anemia   . Cervical cancer ~ 2011    cervical  . Radiation Nov.18,2011-Jan.23,2012    cervix  . Hypertension     no treatment necessary for BP  since July 2013    Medications:  Prescriptions prior to admission  Medication Sig Dispense Refill  . feeding supplement (ENSURE COMPLETE) LIQD Take 237 mLs by mouth 2 (two) times daily between meals.      . magnesium oxide (MAG-OX) 400 (241.3 MG) MG tablet Take 1 tablet (400 mg total) by mouth daily.  30 tablet  1  . metoCLOPramide (REGLAN) 10 MG tablet Take 1 tablet (10 mg total) by mouth 4 (four) times daily as needed (nausea).  30 tablet  0  . ondansetron (ZOFRAN ODT) 8 MG disintegrating tablet Take 1 tablet (8 mg total) by mouth every 8 (eight) hours as needed for nausea.  20 tablet  0  . oxyCODONE-acetaminophen (PERCOCET) 10-325 MG per tablet Take 1 tablet by mouth every 6 (six) hours as needed for pain.  45 tablet  0  . potassium chloride SA (K-DUR,KLOR-CON) 20 MEQ tablet Take 1 tablet (20 mEq total) by mouth  3 (three) times daily.  90 tablet  0    Assessment: 37 y/o female found to have an age indeterminate RLE DVT on doppler. Pharmacy consulted to begin Lovenox. No bleeding noted, CBC is stable. Last Lovenox 40 mg dose was at 05:51.  Goal of Therapy:  Anti-Xa level 0.6-1.2 units/ml 4hrs after LMWH dose given Monitor platelets by anticoagulation protocol: Yes   Plan:  -Lovenox 60 mg SQ q12h -CBC q72h while on Lovenox -F/U long term anticoagulation plans -Monitor for signs/symptoms of bleeding  [facility], [address].D., BCPS Clinical Pharmacist Pager: [phone] 02/11/2013 3:59 PM

## 2013-02-11 NOTE — Progress Notes (Addendum)
Patient ID: Vanessa [name], female   DOB: May 13, 1976, 37 y.o.   MRN: [id number] 8 Days Post-Op  Subjective: Pt feels ok today except woke up last night with bilateral leg pain in thighs and left calf.  More air out of her bag today.  Less nausea  Objective: Vital signs in last 24 hours: Temp:  [97.6 F (36.4 C)-98.2 F (36.8 C)] 98 F (36.7 C) (05/22 0545) Pulse Rate:  [43-92] 81 (05/22 0545) Resp:  [18] 18 (05/22 0545) BP: (124-148)/(91-104) 124/93 mmHg (05/22 0545) SpO2:  [100 %] 100 % (05/22 0545) Last BM Date: 02/09/13  Intake/Output from previous day: 05/21 0701 - 05/22 0700 In: 2489.5 [P.O.:1050; I.V.:319; TPN:1120.5] Out: 1305 [Urine:1300; Drains:5] Intake/Output this shift:    PE: Abd: soft, still bloated.  Stoma still strictured, but more air in her bag today. Ext: bilateral lower extremity edema with painful palpation of her left calf and bilateral thighs  Lab Results:   Recent Labs  02/10/13 0500 02/11/13 0525  WBC 6.8 6.6  HGB 8.7* 9.1*  HCT 27.0* 28.3*  PLT 436* 434*   BMET  Recent Labs  02/10/13 0500 02/11/13 0525  NA 141 138  K 3.0* 3.1*  CL 104 102  CO2 27 25  GLUCOSE 96 100*  BUN 10 13  CREATININE 0.69 0.70  CALCIUM 9.0 9.6   PT/INR No results found for this basename: LABPROT, INR,  in the last 72 hours CMP     Component Value Date/Time   NA 138 02/11/2013 0525   K 3.1* 02/11/2013 0525   CL 102 02/11/2013 0525   CO2 25 02/11/2013 0525   GLUCOSE 100* 02/11/2013 0525   BUN 13 02/11/2013 0525   CREATININE 0.70 02/11/2013 0525   CALCIUM 9.6 02/11/2013 0525   CALCIUM 10.7* 02/01/2012 0705   PROT 6.0 02/11/2013 0525   ALBUMIN 2.3* 02/11/2013 0525   AST 15 02/11/2013 0525   ALT 8 02/11/2013 0525   ALKPHOS 78 02/11/2013 0525   BILITOT 0.1* 02/11/2013 0525   GFRNONAA >90 02/11/2013 0525   GFRAA >90 02/11/2013 0525   Lipase     Component Value Date/Time   LIPASE 8* 02/01/2013 0210       Studies/Results: Ct Abdomen Pelvis W Wo  Contrast  02/10/2013   *RADIOLOGY REPORT*  Clinical Data: Painless hematuria, urinary frequency, history of cervical cancer status post radiation, multiple bowel resections  CT ABDOMEN AND PELVIS WITHOUT AND WITH CONTRAST  Technique:  Multidetector CT imaging of the abdomen and pelvis was performed without contrast material in one or both body regions, followed by contrast material(s) and further sections in one or both body regions.  Contrast: OMNIPAQUE IOHEXOL 300 MG/ML  SOLN  Comparison: 01/28/2013  Findings: Evaluation is constrained by lack of intravenous and oral contrast.  Linear scarring at the lung bases.  Cardiomegaly.  Hypodense blood pool relative to myocardium, suggesting anemia.  Liver, spleen, pancreas, and adrenal glands are grossly unremarkable.  Status post cholecystectomy.  No intrahepatic or extrahepatic ductal dilatation.  Kidneys are grossly unremarkable.  Mild bilateral hydronephrosis, new.  Stable 2 mm nonobstructing left lower pole renal calculus (series 2/image 39).  No evidence of bowel obstruction.  Status post partial left hemicolectomy with left lower quadrant colostomy and Hartmann's pouch.  Suspected suture lines in the lower pelvis (series 2/image 8).  Nonspecific pelvic stranding/fluid.  Adjacent left lower quadrant pigtail drain.  Overall appearance of multiple matted pelvic loops of bowel is grossly unchanged but poorly evaluated in  the absence of intravenous and oral contrast.  Specifically, the suspected fistula between terminal ileum and Hartmann's pouch cannot be confirmed. No evidence of abdominal aortic aneurysm.  Small retroperitoneal lymph nodes.  Status post hysterectomy.  Thick-walled bladder.  Anasarca/body wall edema.  Visualized osseous structures are within normal limits.  IMPRESSION: Mild bilateral hydronephrosis, new.  While the exact etiology is unclear, this may be secondary to extrinsic compression by the suspected pelvic inflammatory process.  Multiple  matted pelvic loops of bowel with pelvic fluid/stranding and adjacent pigtail drain, poorly evaluated on the current study but grossly unchanged.  Status post partial left hemicolectomy with left lower quadrant colostomy and Hartmann's pouch.  Mildly thick-walled bladder, correlate for cystitis.  2 mm nonobstructing left lower pole renal calculus, unchanged.  Additional stable ancillary findings as above.   Original Report Authenticated By: Charline Bills, M.D.    Anti-infectives: Anti-infectives   Start     Dose/Rate Route Frequency Ordered Stop   02/03/13 1315  ertapenem (INVANZ) 1 g in sodium chloride 0.9 % 50 mL IVPB  Status:  Discontinued     1 g 100 mL/hr over 30 Minutes Intravenous  Once 02/03/13 1312 02/03/13 1650   02/03/13 0800  ertapenem (INVANZ) 1 g in sodium chloride 0.9 % 50 mL IVPB  Status:  Discontinued     1 g 100 mL/hr over 30 Minutes Intravenous Every 24 hours 02/03/13 0759 02/03/13 1702       Assessment/Plan 1. Prolapsed ostomy, s/p repair with recurrent non-ischemic prolapse  2. enterocolonic fistula, needs repair, scheduled for 02-16-13  3. Hypoalbuminemia  4. Bilateral lower extremity edema, secondary to #3  5. Hematuria  6. HTN  7. H/o anemia secondary to ABL from severe thrombocytopenia from sepsis, still with anemia  8. H/o cervical cancer, s/p hysterectomy and radiation  9. PCM/TNA (prealbumin 12)  10. Hypokalemia 11. Lower extremity pain/edema 12. Bilateral hydronephrosis  Plan: 1. Replace K orally today to see if this will help more. 2. She has more air in her bag today.  Continue daily digitalization 3. Check BLE duplex scans to rule out DVTs.  I have a low suspicion, but want to rule this possibility out.  Suspect pain secondary to edema 4. HTN per medicine, appreciate their assistance 5. Moderate hydronephrosis, Dr. Mena Goes to look at scan. 6. Cont TNA for additional nutritional support 7. Anemia is stable.  LOS: 8 days    Ashyra Cantin  E 02/11/2013, 11:33 AM Pager: [phone]  ADDENDUM: Duplex scans show age indeterminate right femoral DVT.  We will consult pharmacy to start anticoagulation. Jasnoor Trussell E

## 2013-02-11 NOTE — Consult Note (Signed)
Reason for Consult: Cardiomyopathy Referring Physician: Isidoro Donning  - [facility]   HPI:  Vanessa [name] is an 37 y.o. female mother of 8 (4 girls and four boys ranging for 50-48 years of age). She has a history of Cervical cancer, Radiation treatment, Continued tobacco abuse sinceage 15, HTN, eclampsia, anemia, Colostomy plus revision, cholecystectomy.  Family history significant for CAD in a grandmother and HTN in mother and father.  She had a 2D echo on 01/30/13 which revealed and EF of 20%, diffuse hypokinesis, grade two diastolic dysfunction and PA pressure of .  She also has inferolateral TWI on EKG and prolonged QTc.  She initially presented with bilateral LEE and Abdominal pain.  Net fluids since admission are -1.8L.  She currently complains of SOB, orthopnea, LEE, PND for the last three weeks.  Additionally, she's been nauseated, dizzy with abd pain.   She denies any CP or chest tightness in the last three weeks.  She is S/P repair of a recurrent non-ischemic prolapsed ostomy this admission.    Past Medical History  Diagnosis Date  . History of blood transfusion     "I had ~ 7 in 01/2012"  . Anemia   . Cervical cancer ~ 2011    cervical  . Radiation Nov.18,2011-Jan.23,2012    cervix  . Hypertension     no treatment necessary for BP  since July 2013    Past Surgical History  Procedure Laterality Date  . Radiation implants  ~ 2011    "for cervical cancer"  . Colonoscopy  01/30/2012    Procedure: COLONOSCOPY;  Surgeon: Beverley Fiedler, MD;  Location: [facility] ENDOSCOPY;  Service: Gastroenterology;  Laterality: N/A;  . Colonoscopy  01/30/2012    Procedure: COLONOSCOPY;  Surgeon: Beverley Fiedler, MD;  Location[facility] OR;  Service: Gastroenterology;  Laterality: N/A;  . Colostomy revision  02/04/2012    Procedure: COLON RESECTION SIGMOID;  Surgeon: Cherylynn Ridges, MD;  Location: [facility] OR;  Service: General;  Laterality: N/A;  . Cholecystectomy  2006  . Laparotomy  08/11/2012    Procedure:  EXPLORATORY LAPAROTOMY;  Surgeon: Cherylynn Ridges, MD;  Location: [facility] OR;  Service: General;  Laterality: N/A;  . Laparoscopic lysis of adhesions  08/11/2012    Procedure: LAPAROSCOPIC LYSIS OF ADHESIONS;  Surgeon: Cherylynn Ridges, MD;  Location: [facility] OR;  Service: General;  Laterality: N/A;  . Bowel resection  08/11/2012    Procedure: SMALL BOWEL RESECTION;  Surgeon: Cherylynn Ridges, MD;  Location: [facility] OR;  Service: General;  Laterality: N/A;  . Colostomy  08/11/2012    Procedure: COLOSTOMY;  Surgeon: Cherylynn Ridges, MD;  Location: [facility] OR;  Service: General;  Laterality: N/A;  . Colostomy revision N/A 02/03/2013    Procedure: COLOSTOMY REVISION;  Surgeon: Cherylynn Ridges, MD;  Location: [facility] OR;  Service: General;  Laterality: N/A;    History reviewed. No pertinent family history.  Social History:  reports that she quit smoking about 12 months ago. Her smoking use included Cigarettes. She has a .25 pack-year smoking history. She has never used smokeless tobacco. She reports that  drinks alcohol. She reports that she uses illicit drugs (Marijuana).  Allergies:  Allergies  Allergen Reactions  . Penicillins Other (See Comments)    "anything w/penicillin in it makes me bleed"  . Labetalol Hcl Itching and Other (See Comments)    Bumps to bilateral arms.  . Morphine And Related Hives    Medications:  Scheduled Meds: . Melene Muller  ON 02/12/2013] amLODipine  5 mg Oral Daily  . enoxaparin (LOVENOX) injection  40 mg Subcutaneous Q24H  . feeding supplement  237 mL Oral BID BM  . lisinopril  10 mg Oral Daily  . pantoprazole  40 mg Oral QHS  . potassium chloride  40 mEq Oral BID  . traMADol  50 mg Oral Q6H   Continuous Infusions: . Marland KitchenTPN (CLINIMIX-E) Adult    . sodium chloride 20 mL/hr (02/09/13 1909)  . Marland KitchenTPN (CLINIMIX-E) Adult 80 mL/hr at 02/10/13 1735   And  . fat emulsion 240 mL (02/10/13 1735)   PRN Meds:.hydrALAZINE, HYDROmorphone (DILAUDID) injection, ondansetron, oxyCODONE, promethazine, sodium  chloride   Results for orders placed during the hospital encounter of 02/03/13 (from the past 48 hour(s))  GLUCOSE, CAPILLARY     Status: None   Collection Time    02/09/13  5:02 PM      Result Value Range   Glucose-Capillary 88  70 - 99 mg/dL  GLUCOSE, CAPILLARY     Status: Abnormal   Collection Time    02/09/13  7:41 PM      Result Value Range   Glucose-Capillary 118 (*) 70 - 99 mg/dL  GLUCOSE, CAPILLARY     Status: Abnormal   Collection Time    02/09/13 11:27 PM      Result Value Range   Glucose-Capillary 117 (*) 70 - 99 mg/dL  GLUCOSE, CAPILLARY     Status: Abnormal   Collection Time    02/10/13  3:34 AM      Result Value Range   Glucose-Capillary 119 (*) 70 - 99 mg/dL  BASIC METABOLIC PANEL     Status: Abnormal   Collection Time    02/10/13  5:00 AM      Result Value Range   Sodium 141  135 - 145 mEq/L   Potassium 3.0 (*) 3.5 - 5.1 mEq/L   Chloride 104  96 - 112 mEq/L   CO2 27  19 - 32 mEq/L   Glucose, Bld 96  70 - 99 mg/dL   BUN 10  6 - 23 mg/dL   Creatinine, Ser 0.98  0.50 - 1.10 mg/dL   Calcium 9.0  8.4 - 11.9 mg/dL   GFR calc non Af Amer >90  >90 mL/min   GFR calc Af Amer >90  >90 mL/min   Comment:            The eGFR has been calculated     using the CKD EPI equation.     This calculation has not been     validated in all clinical     situations.     eGFR's persistently     <90 mL/min signify     possible Chronic Kidney Disease.  PHOSPHORUS     Status: Abnormal   Collection Time    02/10/13  5:00 AM      Result Value Range   Phosphorus 4.8 (*) 2.3 - 4.6 mg/dL  MAGNESIUM     Status: None   Collection Time    02/10/13  5:00 AM      Result Value Range   Magnesium 1.6  1.5 - 2.5 mg/dL  CBC     Status: Abnormal   Collection Time    02/10/13  5:00 AM      Result Value Range   WBC 6.8  4.0 - 10.5 K/uL   RBC 3.00 (*) 3.87 - 5.11 MIL/uL   Hemoglobin 8.7 (*) 12.0 - 15.0 g/dL  HCT 27.0 (*) 36.0 - 46.0 %   MCV 90.0  78.0 - 100.0 fL   MCH 29.0  26.0 -  34.0 pg   MCHC 32.2  30.0 - 36.0 g/dL   RDW 54.0 (*) 98.1 - 19.1 %   Platelets 436 (*) 150 - 400 K/uL  GLUCOSE, CAPILLARY     Status: Abnormal   Collection Time    02/10/13  7:37 AM      Result Value Range   Glucose-Capillary 102 (*) 70 - 99 mg/dL  GLUCOSE, CAPILLARY     Status: Abnormal   Collection Time    02/10/13 11:54 AM      Result Value Range   Glucose-Capillary 117 (*) 70 - 99 mg/dL  GLUCOSE, CAPILLARY     Status: None   Collection Time    02/10/13  6:14 PM      Result Value Range   Glucose-Capillary 98  70 - 99 mg/dL  COMPREHENSIVE METABOLIC PANEL     Status: Abnormal   Collection Time    02/11/13  5:25 AM      Result Value Range   Sodium 138  135 - 145 mEq/L   Potassium 3.1 (*) 3.5 - 5.1 mEq/L   Chloride 102  96 - 112 mEq/L   CO2 25  19 - 32 mEq/L   Glucose, Bld 100 (*) 70 - 99 mg/dL   BUN 13  6 - 23 mg/dL   Creatinine, Ser 4.78  0.50 - 1.10 mg/dL   Calcium 9.6  8.4 - 29.5 mg/dL   Total Protein 6.0  6.0 - 8.3 g/dL   Albumin 2.3 (*) 3.5 - 5.2 g/dL   AST 15  0 - 37 U/L   ALT 8  0 - 35 U/L   Alkaline Phosphatase 78  39 - 117 U/L   Total Bilirubin 0.1 (*) 0.3 - 1.2 mg/dL   GFR calc non Af Amer >90  >90 mL/min   GFR calc Af Amer >90  >90 mL/min   Comment:            The eGFR has been calculated     using the CKD EPI equation.     This calculation has not been     validated in all clinical     situations.     eGFR's persistently     <90 mL/min signify     possible Chronic Kidney Disease.  MAGNESIUM     Status: None   Collection Time    02/11/13  5:25 AM      Result Value Range   Magnesium 1.8  1.5 - 2.5 mg/dL  PHOSPHORUS     Status: None   Collection Time    02/11/13  5:25 AM      Result Value Range   Phosphorus 3.8  2.3 - 4.6 mg/dL  CBC     Status: Abnormal   Collection Time    02/11/13  5:25 AM      Result Value Range   WBC 6.6  4.0 - 10.5 K/uL   RBC 3.14 (*) 3.87 - 5.11 MIL/uL   Hemoglobin 9.1 (*) 12.0 - 15.0 g/dL   HCT 62.1 (*) 30.8 - 65.7 %    MCV 90.1  78.0 - 100.0 fL   MCH 29.0  26.0 - 34.0 pg   MCHC 32.2  30.0 - 36.0 g/dL   RDW 84.6 (*) 96.2 - 95.2 %   Platelets 434 (*) 150 - 400 K/uL    Ct  Abdomen Pelvis W Wo Contrast  02/10/2013   *RADIOLOGY REPORT*  Clinical Data: Painless hematuria, urinary frequency, history of cervical cancer status post radiation, multiple bowel resections  CT ABDOMEN AND PELVIS WITHOUT AND WITH CONTRAST  Technique:  Multidetector CT imaging of the abdomen and pelvis was performed without contrast material in one or both body regions, followed by contrast material(s) and further sections in one or both body regions.  Contrast: OMNIPAQUE IOHEXOL 300 MG/ML  SOLN  Comparison: 01/28/2013  Findings: Evaluation is constrained by lack of intravenous and oral contrast.  Linear scarring at the lung bases.  Cardiomegaly.  Hypodense blood pool relative to myocardium, suggesting anemia.  Liver, spleen, pancreas, and adrenal glands are grossly unremarkable.  Status post cholecystectomy.  No intrahepatic or extrahepatic ductal dilatation.  Kidneys are grossly unremarkable.  Mild bilateral hydronephrosis, new.  Stable 2 mm nonobstructing left lower pole renal calculus (series 2/image 39).  No evidence of bowel obstruction.  Status post partial left hemicolectomy with left lower quadrant colostomy and Hartmann's pouch.  Suspected suture lines in the lower pelvis (series 2/image 8).  Nonspecific pelvic stranding/fluid.  Adjacent left lower quadrant pigtail drain.  Overall appearance of multiple matted pelvic loops of bowel is grossly unchanged but poorly evaluated in the absence of intravenous and oral contrast.  Specifically, the suspected fistula between terminal ileum and Hartmann's pouch cannot be confirmed. No evidence of abdominal aortic aneurysm.  Small retroperitoneal lymph nodes.  Status post hysterectomy.  Thick-walled bladder.  Anasarca/body wall edema.  Visualized osseous structures are within normal limits.   IMPRESSION: Mild bilateral hydronephrosis, new.  While the exact etiology is unclear, this may be secondary to extrinsic compression by the suspected pelvic inflammatory process.  Multiple matted pelvic loops of bowel with pelvic fluid/stranding and adjacent pigtail drain, poorly evaluated on the current study but grossly unchanged.  Status post partial left hemicolectomy with left lower quadrant colostomy and Hartmann's pouch.  Mildly thick-walled bladder, correlate for cystitis.  2 mm nonobstructing left lower pole renal calculus, unchanged.  Additional stable ancillary findings as above.   Original Report Authenticated By: Charline Bills, M.D.   01/30/13, 2D Echocardiogram, Study Conclusions  - Left ventricle: The cavity size was moderately dilated. Wall thickness was increased in a pattern of mild LVH. The estimated ejection fraction was 20%. Diffuse hypokinesis. Features are consistent with a pseudonormal left ventricular filling pattern, with concomitant abnormal relaxation and increased filling pressure (grade 2 diastolic dysfunction). - Ventricular septum: Septal motion showed paradox. - Mitral valve: Mild regurgitation. - Right atrium: The atrium was moderately dilated. - Tricuspid valve: Moderate regurgitation. - Pulmonic valve: Moderate regurgitation. - Pulmonary arteries: PA peak pressure: 67mm Hg (S).  EKG: Inferolateral TWI.  Prolonged QTc.    02/11/13: Bilateral lower extremity venous Dopplers completed.  Preliminary report: There appears to be non occlusive, age indeterminate DVT isolated to the confluence of the common femoral and femoral veins of the right lower extremity. All other veins appear thrombus free.  KANADY, CANDACE, RVT  Review of Systems  Constitutional: Negative for fever and diaphoresis.  Respiratory: Positive for shortness of breath. Negative for cough.   Cardiovascular: Positive for orthopnea, leg swelling and PND. Negative for chest pain.   Gastrointestinal: Positive for nausea and abdominal pain. Negative for vomiting.  Neurological: Positive for dizziness. Negative for weakness.  All other systems reviewed and are negative.   Blood pressure 148/99, pulse 90, temperature 98.4 F (36.9 C), temperature source Oral, resp. rate 20, height 5\' 4"  (  1.626 m), weight 131 lb 8 oz (59.648 kg), SpO2 100.00%. Physical Exam  Constitutional: She is oriented to person, place, and time. She appears well-developed and well-nourished.  HENT:  Head: Normocephalic and atraumatic.  Eyes: EOM are normal. Pupils are equal, round, and reactive to light. No scleral icterus.  Neck: Normal range of motion. Neck supple. JVD present.  Cardiovascular: Normal rate, regular rhythm, S1 normal and S2 normal.   No murmur heard. Pulses:      Radial pulses are 1+ on the right side, and 1+ on the left side.       Dorsalis pedis pulses are 2+ on the right side, and 2+ on the left side.  No carotid Bruit  Respiratory: Effort normal and breath sounds normal. She has no wheezes. She has no rales.  GI: Bowel sounds are normal. She exhibits no distension.  Musculoskeletal: Normal range of motion. She exhibits edema.  2+ pitting edema  Neurological: She is alert and oriented to person, place, and time. She exhibits normal muscle tone.  Skin: Skin is warm and dry.  Psychiatric: She has a normal mood and affect.    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 Nonischemic.  Possibly related to HTN, although EKG has shown some ischemic changes.  Echo shows diffuse hypokinesis.  Recommend right and left heart catheterization.  Will start IV lasix 20mg  BID.  I do not  recommend CCB for BP control given cardiomyopathy.  Agree with ACE-I.  Will add low dose BB and titrate as needed.  She has had labetalol in the past which caused hives.  Will need to monitor.  Hydralazine is a good option as well.  Lovenox ordered per primary for new right femoral DVT.   Potassium will need to be monitored closely as it is already low and Lasix will decrease it further.  I am ordering an extra now.    HAGER, BRYAN 02/11/2013, 3:23 PM   I have seen and evaluated the patient this PM along with Wilburt Finlay, PA. I agree with his findings, examination as well as impression recommendations.  37 year old woman with a history of eclampsia during her last pregnancy 4 years ago as best I can tell has never had a cardiac evaluation. As part of her hospitalization the beginning of this month following operation for cervical cancer, she had an echocardiogram and performed which is on May 10. My understanding is a this is to evaluate large to be edema. Unfortunately she was discharged before this but he was resulted. The plan was for the results are followed up by her primary physician. Unfortunately, she was then readmitted 4 days later due to prolapse of her colostomy requiring redo surgery. She done well with her surgeries, and is without to much with complaint however she does have persistent lower should be edema that for last couple weeks has been thought to be due to hypoalbuminemia.  Closer evaluation by the new hospitalists [facility] revealed that the echocardiogram done on May 10 was actually quite abnormal. She an ejection fraction of roughly 20% with global hypokinesis. Once this was identified by Dr. Isidoro Donning, we were counseled for assistance in management.  Her EKG currently is not that different from the one checked roughly a year ago with evidence of LVH and repolarization abnormalities. She's never had, and never mentions a history of chest tightness or pressure with  rest or exertion. She says that the edema really started getting bad 34 weeks ago, but she is had orthopnea and PND for several months now. She denies any significant palpitations or symptoms suggestive of arrhythmia. No syncope near syncope.  Reviewing the echocardiogram, the most likely etiology of her heart failure is nonischemic, due to the global hypokinesis and no suggestion of the regional wall motion abnormality. In the absence of anginal symptoms, the need for invasive evaluation for ischemic etiology is not required, however noninvasive study such as a Myoview maybe be warranted. Unfortunately, she is currently in a situation of requiring another surgery next week. I think as she done well for the last 2, our focus should be on blood pressure control with afterload reduction. We switch her Norvasc to hydralazine/nitrate. We'll also provide IV diuresis based on her but significantly elevated due JVP suggesting volume overload. We should probably shoot for roughly 500 mL - 1 liter diuresis for the first couple days. We'll need to keep up with her in from her TPN.  Will continue to follow her over the course the weekend to make adjustments. We'll also make recommendations as to timing and appropriateness of evaluation depending on how she does over the next several days. She should be on telemetry simply due to her low ejection fraction. She does not seem to have had any symptoms to suggest any arrhythmias.   Marykay Lex, M.D., M.S. [facility] [address] [place], [place]  [address]  [phone] Pager # [phone] 02/11/2013 6:30 PM

## 2013-02-12 DIAGNOSIS — I82419 Acute embolism and thrombosis of unspecified femoral vein: Secondary | ICD-10-CM | POA: Diagnosis present

## 2013-02-12 DIAGNOSIS — K651 Peritoneal abscess: Secondary | ICD-10-CM

## 2013-02-12 LAB — BASIC METABOLIC PANEL
CO2: 25 mEq/L (ref 19–32)
Calcium: 9.9 mg/dL (ref 8.4–10.5)
GFR calc non Af Amer: >90 mL/min (ref >90)
Sodium: 140 mEq/L (ref 135–145)

## 2013-02-12 LAB — CBC
HCT: 26.8 % — ABNORMAL LOW (ref 36.0–46.0)
Platelets: 401 10*3/uL — ABNORMAL HIGH (ref 150–400)
RDW: 15.7 % — ABNORMAL HIGH (ref 11.5–15.5)
WBC: 5.3 10*3/uL (ref 4.0–10.5)

## 2013-02-12 LAB — MAGNESIUM: Magnesium: 2.1 mg/dL (ref 1.5–2.5)

## 2013-02-12 MED ORDER — WARFARIN - PHARMACIST DOSING INPATIENT
Freq: Every day | Status: DC
  Administered 2013-02-12: 18:00:00

## 2013-02-12 MED ORDER — WARFARIN VIDEO
Freq: Once | Status: AC
  Administered 2013-02-12: 11:00:00

## 2013-02-12 MED ORDER — TRACE MINERALS CR-CU-F-FE-I-MN-MO-SE-ZN IV SOLN
INTRAVENOUS | Status: DC
  Filled 2013-02-12: qty 2000

## 2013-02-12 MED ORDER — FUROSEMIDE 10 MG/ML IJ SOLN
40.0000 mg | Freq: Two times a day (BID) | INTRAMUSCULAR | Status: DC
  Filled 2013-02-12 (×2): qty 4

## 2013-02-12 MED ORDER — FUROSEMIDE 10 MG/ML IJ SOLN
40.0000 mg | Freq: Two times a day (BID) | INTRAMUSCULAR | Status: DC
  Administered 2013-02-12 – 2013-02-15 (×7): 40 mg via INTRAVENOUS
  Filled 2013-02-12 (×8): qty 4

## 2013-02-12 MED ORDER — FAT EMULSION 20 % IV EMUL
240.0000 mL | INTRAVENOUS | Status: AC
  Administered 2013-02-12: 240 mL via INTRAVENOUS
  Filled 2013-02-12: qty 250

## 2013-02-12 MED ORDER — COUMADIN BOOK
Freq: Once | Status: AC
  Administered 2013-02-12: 11:00:00
  Filled 2013-02-12: qty 1

## 2013-02-12 MED ORDER — WARFARIN SODIUM 5 MG PO TABS
5.0000 mg | ORAL_TABLET | Freq: Once | ORAL | Status: AC
  Administered 2013-02-12: 5 mg via ORAL
  Filled 2013-02-12: qty 1

## 2013-02-12 MED ORDER — TRACE MINERALS CR-CU-F-FE-I-MN-MO-SE-ZN IV SOLN
INTRAVENOUS | Status: AC
  Administered 2013-02-12: 17:00:00 via INTRAVENOUS
  Filled 2013-02-12: qty 2000

## 2013-02-12 MED ORDER — CARVEDILOL 3.125 MG PO TABS
3.1250 mg | ORAL_TABLET | Freq: Two times a day (BID) | ORAL | Status: DC
  Administered 2013-02-12 – 2013-02-13 (×2): 3.125 mg via ORAL
  Filled 2013-02-12 (×4): qty 1

## 2013-02-12 MED ORDER — FAT EMULSION 20 % IV EMUL
240.0000 mL | INTRAVENOUS | Status: DC
  Filled 2013-02-12: qty 250

## 2013-02-12 NOTE — Progress Notes (Signed)
[facility]  Subjective: Doing well.   Objective: Vital signs in last 24 hours: Temp:  [97.7 F (36.5 C)-98.4 F (36.9 C)] 97.8 F (36.6 C) (05/23 0504) Pulse Rate:  [74-90] 74 (05/23 0504) Resp:  [16-20] 16 (05/23 0504) BP: (125-148)/(83-99) 125/83 mmHg (05/23 0504) SpO2:  [94 %-100 %] 97 % (05/23 0504) Last BM Date: 02/09/13  Intake/Output from previous day: 05/22 0701 - 05/23 0700 In: 600 [P.O.:600] Out: 1700 [Urine:1700] Intake/Output this shift:    Medications Current Facility-Administered Medications  Medication Dose Route Frequency Provider Last Rate Last Dose  . Marland KitchenTPN (CLINIMIX-E) Adult   Intravenous Continuous TPN Lauren Bajbus, RPH 80 mL/hr at 02/11/13 1719    . 0.45 % sodium chloride infusion   Intravenous Continuous Lauren Bajbus, RPH 20 mL/hr at 02/09/13 1909 20 mL/hr at 02/09/13 1909  . enoxaparin (LOVENOX) injection 60 mg  60 mg Subcutaneous Q12H [facility] Rosa Sanchez, [place]   60 mg at 02/12/13 4098  . feeding supplement (ENSURE COMPLETE) liquid 237 mL  237 mL Oral BID BM Cherylynn Ridges, MD   237 mL at 02/09/13 0946  . furosemide (LASIX) injection 40 mg  40 mg Intravenous BID Wilburt Finlay, PA-C   40 mg at 02/12/13 0849  . hydrALAZINE (APRESOLINE) injection 5 mg  5 mg Intravenous Q4H PRN Jerald Kief, MD   5 mg at 02/10/13 2119  . HYDROmorphone (DILAUDID) injection 0.5-2 mg  0.5-2 mg Intravenous Q4H PRN Cherylynn Ridges, MD   2 mg at 02/12/13 0153  . isosorbide-hydrALAZINE (BIDIL) 20-37.5 MG per tablet 1 tablet  1 tablet Oral BID Wilburt Finlay, PA-C   1 tablet at 02/11/13 2131  . lisinopril (PRINIVIL,ZESTRIL) tablet 10 mg  10 mg Oral Daily Jerald Kief, MD   10 mg at 02/11/13 1009  . metoprolol tartrate (LOPRESSOR) tablet 12.5 mg  12.5 mg Oral BID Wilburt Finlay, PA-C   12.5 mg at 02/11/13 2131  . ondansetron (ZOFRAN) injection 4 mg  4 mg Intravenous Q6H PRN Cherylynn Ridges, MD   4 mg at 02/11/13 1255  . oxyCODONE (Oxy IR/ROXICODONE)  immediate release tablet 5-15 mg  5-15 mg Oral Q4H PRN Liz Malady, MD   10 mg at 02/12/13 0849  . pantoprazole (PROTONIX) EC tablet 40 mg  40 mg Oral QHS Herby Abraham, RPH   40 mg at 02/11/13 2131  . promethazine (PHENERGAN) injection 6.25-12.5 mg  6.25-12.5 mg Intravenous Q6H PRN Letha Cape, PA-C   12.5 mg at 02/09/13 1619  . sodium chloride 0.9 % injection 10-40 mL  10-40 mL Intracatheter PRN Letha Cape, PA-C   10 mL at 02/12/13 0537  . traMADol (ULTRAM) tablet 50 mg  50 mg Oral Q6H Liz Malady, MD   50 mg at 02/12/13 1191    PE: General appearance: alert, cooperative and no distress Neck: +JVD Lungs: clear to auscultation bilaterally Heart: regular rate and rhythm and Split S2, no MM Extremities: 2+ LEE Pulses: 2+ and symmetric Neurologic: Grossly normal  Lab Results:   Recent Labs  02/10/13 0500 02/11/13 0525 02/12/13 0500  WBC 6.8 6.6 5.3  HGB 8.7* 9.1* 8.5*  HCT 27.0* 28.3* 26.8*  PLT 436* 434* 401*   BMET  Recent Labs  02/10/13 0500 02/11/13 0525 02/12/13 0500  NA 141 138 140  K 3.0* 3.1* 4.0  CL 104 102 106  CO2 27 25 25   GLUCOSE 96 100* 92  BUN 10 13 14  CREATININE 0.69 0.70 0.73  CALCIUM 9.0 9.6 9.9   01/30/13 2D Echo, Study Conclusions  - Left ventricle: The cavity size was moderately dilated. Wall thickness was increased in a pattern of mild LVH. The estimated ejection fraction was 20%. Diffuse hypokinesis. Features are consistent with a pseudonormal left ventricular filling pattern, with concomitant abnormal relaxation and increased filling pressure (grade 2 diastolic dysfunction). - Ventricular septum: Septal motion showed paradox. - Mitral valve: Mild regurgitation. - Right atrium: The atrium was moderately dilated. - Tricuspid valve: Moderate regurgitation. - Pulmonic valve: Moderate regurgitation. - Pulmonary arteries: PA peak pressure: 67mm Hg (S).  Lipid Panel     Component Value Date/Time   CHOL 64 02/09/2013  0622   TRIG 71 02/09/2013 0622    Assessment/Plan   Principal Problem:   Essential hypertension, benign Active Problems:   Acute combined systolic and diastolic heart failure   Pulmonary HTN, severe   DVT, femoral, acute   Post-radiation rectal bleeding   Ileostomy in place   Colocutaneous fistula   Thrombocytosis   Bilateral lower extremity edema  Plan:   Newly discovered cardiomyopathy, EF 20%, with orthopnea and LEE for greater than three weeks.  Net fluids: -1.1 L. However, the TPN does not appear to be charted and is running at 48ml/hr.  Strict I&O's with daily weights.  BNP 2330.0.  Increasing lasix to 40mg  IV BID.   Telemetry: SR with PVCs, multiform.  Sodium restricted diet.    LOS: 9 days    HAGER, BRYAN 02/12/2013 8:49 AM  I have seen and evaluated the patient this PM along with Wilburt Finlay, PA. I agree with his findings, examination as well as impression recommendations.  Newly diagnosed (but likely chronic) Cardiomyopathy - clearly volume overloaded on exam.   Despite her concern re diuresis (? If a foley would help), we need to be net negative & are competing with 2L in from TPN (? If this can be concentrated).  Will need to closely monitor K+ with increased Lasix. Hopefully we can diurese enough convert to PO lasix in ~2-3 days.  BP has improved well. No sign of arrhythmia. On low dose BB (but will change to Carvedilol) & BiDil.  I do not feel that she is any worse off now than she was when she tolerated her last couple surgeries.   We should take advantage of her being here to consider ST while she is in-patient, but would prefer to do so after some diuresis.  MD Time with pt: 5 min  HARDING,DAVID W, M.D., M.S. [facility] [address] [place], [place]  [address]  [phone] Pager # [phone] 02/12/2013 12:56 PM

## 2013-02-12 NOTE — Progress Notes (Addendum)
PARENTERAL NUTRITION CONSULT NOTE - INITIAL  Pharmacy Consult for TPN Indication: complex GI history with pending fistula repair  Allergies  Allergen Reactions  . Penicillins Other (See Comments)    "anything w/penicillin in it makes me bleed"  . Labetalol Hcl Itching and Other (See Comments)    Bumps to bilateral arms.  . Morphine And Related Hives    Patient Measurements: Height: 5\' 4"  (162.6 cm) Weight: 131 lb 8 oz (59.648 kg) IBW/kg (Calculated) : 54.7  Vital Signs: Temp: 97.8 F (36.6 C) (05/23 0504) Temp src: Oral (05/23 0504) BP: 125/83 mmHg (05/23 0504) Pulse Rate: 74 (05/23 0504) Intake/Output from previous day: 05/22 0701 - 05/23 0700 In: 600 [P.O.:600] Out: 1700 [Urine:1700] Intake/Output from this shift:    Labs:  Recent Labs  02/10/13 0500 02/11/13 0525 02/12/13 0500  WBC 6.8 6.6 5.3  HGB 8.7* 9.1* 8.5*  HCT 27.0* 28.3* 26.8*  PLT 436* 434* 401*     Recent Labs  02/10/13 0500 02/11/13 0525 02/12/13 0500  NA 141 138 140  K 3.0* 3.1* 4.0  CL 104 102 106  CO2 27 25 25   GLUCOSE 96 100* 92  BUN 10 13 14   CREATININE 0.69 0.70 0.73  CALCIUM 9.0 9.6 9.9  MG 1.6 1.8 2.1  PHOS 4.8* 3.8  --   PROT  --  6.0  --   ALBUMIN  --  2.3*  --   AST  --  15  --   ALT  --  8  --   ALKPHOS  --  78  --   BILITOT  --  0.1*  --    Estimated Creatinine Clearance: 83.1 ml/min (by C-G formula based on Cr of 0.73).    Recent Labs  02/10/13 0737 02/10/13 1154 02/10/13 1814  GLUCAP 102* 117* 98    Medical History: Past Medical History  Diagnosis Date  . History of blood transfusion     "I had ~ 7 in 01/2012"  . Anemia   . Cervical cancer ~ 2011    cervical  . Radiation Nov.18,2011-Jan.23,2012    cervix  . Hypertension     no treatment necessary for BP  since July 2013    Insulin Requirements in the past 24 hours:  None- SSI has been discontinued  Current Nutrition:  Full liquid diet- noted 100% intake of breakfast and 50% of lunch  yesterday, also receiving Ensure BID BM; Clinimix E 5/15 at goal rate of 12mL/hr- provides 95g of protein/day and average of 1560kcal/day with fats given 3x week. Would meet 100% of protein goal and 86% of kcal goal  Assessment: Patient known to pharmacy from past TPN consults. She has a prolapsed ostomy which is s/p repair and is awaiting a fistula repair (scheduled for 5/27). Patient has extensive GI history. Was being worked up for possible obstruction but there does not appear to be one. Through conversation with RD and surgery PA, desire TPN at this point to ensure patient stays nutritionally stable prior to future procedures. Note plans to hold off on surgery indefinitely until nutritional status is improved  Nutritional Goals:  1800-1920 kCal, 80-95 grams of protein per day per RD recommendations 5/19  GI: s/p prolapsed colectomy revision, appears no obstruction, some BS and abd is soft but bloated, less nauseous. PO PPI  Endo: no hx, has not required insulin in her TPN in the past  Lytes: K nml at 4- will need to watch closely as patient now on scheduled IV Lasix,  phos decreased to normal range at 3.8, mag 2.1, corrected Ca 10.9, other lytes nml  Renal: SCr 0.73, CrCl ~45mL/min; has half NS running at 10-85mL/hr; urology recommends cytoscopy and retrograde pyelograms in OR to fully finish evaluation for hematuria  Pulm: RA  Cards: seen by cards for cardiomyopathy (EF 5/10 20%; pro-BNP >2000 5/22)- would like to perform Myoview, but will hold off d/t scheduled surgery next week. For now will be controlling BP and reducing afterload- VSS, on Lasix IV BID, BiDil, lisinopril, and Lopressor  Hepatobil: alkphos, AST, ALT all nml, albumin low at 2.3, TBili low at 0.1; baseline prealbumin 12.1, recheck 11.7 (only 1 day after baseline check); baseline trigs 71  Neuro: no issues  ID: WBC 5.3, afebrile, no abx  Best Practices: Lovenox 60 q12- age indeterminate DVT found on doppler 5/22  TPN  Access: PICC placed 5/19 TPN day#: 3 (started 5/19)   Plan:  - Per [facility] request, will start to cycle Clinimix E 5/15. Will start cycle over 18 hours with 37mL/hr x1 hour, then 128mL/hr over 16 hours, then 4mL/hr x1 hour for a total of over 18 hours. Goal will be to achieve a 12 hour cyclic regimen pending patient's electrolytes and CBGs. - will provide MVI/trace and IV Lipids MWF only due to ongoing shortages - will continue IVF as half NS at 10-101mL/hr - will follow up diet progression - BMET, mag and phos ordered for the morning   Rori Goar D. Delmon Andrada, PharmD Clinical Pharmacist Pager: [phone] 02/12/2013 8:36 AM

## 2013-02-12 NOTE — Progress Notes (Signed)
Patient ID: Vanessa [name], female   DOB: [birth year], 37 y.o.   MRN: [id number]   I reviewed patient's CT Hematuria images - did not show anything concerning but incompletely evaluated the ureters. I wouldn't think the mild dilation/hydronephrosis is clinically significant given her normal renal function and lack of flank pain. She still needs cystoscopy and I would recommend retrograde pyelograms in the OR. This could be done prior to her next surgery or I could set up as a separate procedure if her next surgery might be delayed a few months due to her nutritional status.

## 2013-02-12 NOTE — Progress Notes (Signed)
NUTRITION FOLLOW UP  Intervention:   1.  Parenteral nutrition; per PharmD management 2.  General healthful diet; continue PO diet as well as supplements  Nutrition Dx:   Altered GI function, ongoing  Monitor:   1. Food/Beverage; PO/enteral diet as able to preserve gut integrity and promote gut health. Met, ongoing 2. Parenteral nutrition; per PharmD management to meet >/=90% estimated needs. Met, ongoing.   Assessment:   Pt admitted with abdominal pain at colostomy site. Pt with prolapse of colostomy s/p revision. Pt progressed post-op and was able to achieve Regular diet, however unable to sustain well with bloating, distention, and pain. Pt has resumed PO diet with nausea and diarhea.  She is drinking Ensure TID.  Nutrition Focused Physical Exam:  Subcutaneous Fat:  Orbital Region: wnl Upper Arm Region: severe Thoracic and Lumbar Region: wnl  Muscle:  Temple Region: moderate Clavicle Bone Region: moderate Clavicle and Acromion Bone Region: n/a Scapular Bone Region: n/a Dorsal Hand: wnl Patellar Region: n/a Anterior Thigh Region: n/a Posterior Calf Region: n/a  Edema: lower extremities   Patient is receiving TPN with Clinimix E 5/15 @ 80 ml/hr.  Lipids (20% IVFE @ 10 ml/hr), multivitamins, and trace elements are provided 3 times weekly (MWF) due to national backorder.  Provides 1568 kcal and 96 grams protein daily (based on weekly average).  Meets 87% minimum estimated kcal and 100% minimum estimated protein needs.  Pt may be meeting remaining calorie needs (~300 kcal) with PO intake unless malabsorption occuring.  Monitor wt when not variable/affected by fluid.     Height: Ht Readings from Last 1 Encounters:  02/03/13 5\' 4"  (1.626 m)    Weight Status:   Wt Readings from Last 1 Encounters:  02/12/13 147 lb 14.9 oz (67.1 kg)    Re-estimated needs:  Kcal: 1800-1920 Protein: 80-95g Fluid: 1.8-2.0 L/day  Skin: intact, edema  Diet Order:  General   Intake/Output Summary (Last 24 hours) at 02/12/13 1605 Last data filed at 02/12/13 1400  Gross per 24 hour  Intake   1720 ml  Output   1900 ml  Net   -180 ml    Last BM: 5/23   Labs:   Recent Labs Lab 02/08/13 1200  02/10/13 0500 02/11/13 0525 02/12/13 0500  NA 143  < > 141 138 140  K 4.1  < > 3.0* 3.1* 4.0  CL 107  < > 104 102 106  CO2 25  < > 27 25 25   BUN 7  < > 10 13 14   CREATININE 0.83  < > 0.69 0.70 0.73  CALCIUM 10.3  < > 9.0 9.6 9.9  MG 1.9  --  1.6 1.8 2.1  PHOS 3.4  --  4.8* 3.8  --   GLUCOSE 98  < > 96 100* 92  < > = values in this interval not displayed.  CBG (last 3)   Recent Labs  02/10/13 0737 02/10/13 1154 02/10/13 1814  GLUCAP 102* 117* 98    Scheduled Meds: . carvedilol  3.125 mg Oral BID WC  . enoxaparin (LOVENOX) injection  60 mg Subcutaneous Q12H  . feeding supplement  237 mL Oral BID BM  . furosemide  40 mg Intravenous BID  . isosorbide-hydrALAZINE  1 tablet Oral BID  . lisinopril  10 mg Oral Daily  . pantoprazole  40 mg Oral QHS  . traMADol  50 mg Oral Q6H  . warfarin  5 mg Oral ONCE-1800  . Warfarin - Pharmacist Dosing Inpatient   Does  not apply q1800    Continuous Infusions: . Marland KitchenTPN (CLINIMIX-E) Adult 80 mL/hr at 02/12/13 1400  . sodium chloride 20 mL/hr (02/09/13 1909)  . Marland KitchenTPN (CLINIMIX-E) Adult     And  . fat emulsion      Loyce Dys, MS RD LDN Clinical Inpatient Dietitian Pager: [phone] Weekend/After hours pager: [phone]

## 2013-02-12 NOTE — Progress Notes (Signed)
Agree with above, doing much better from ileus and colostomy edema, appreciate other services help

## 2013-02-12 NOTE — Progress Notes (Signed)
Patient ID: Vanessa [name], female   DOB: 01-Sep-1976, 37 y.o.   MRN: [id number] 9 Days Post-Op  Subjective: Pt feels well today.  C/o urinating a lot due to diuresis.  Passing some semi-solid stool from rectum.  Still passing air and liquid in bag.  Objective: Vital signs in last 24 hours: Temp:  [97.7 F (36.5 C)-98.4 F (36.9 C)] 97.8 F (36.6 C) (05/23 0504) Pulse Rate:  [74-90] 74 (05/23 0504) Resp:  [16-20] 16 (05/23 0504) BP: (125-148)/(83-99) 125/83 mmHg (05/23 0504) SpO2:  [94 %-100 %] 97 % (05/23 0504) Last BM Date: 02/09/13  Intake/Output from previous day: 05/22 0701 - 05/23 0700 In: 620 [P.O.:600; I.V.:20] Out: 1700 [Urine:1700] Intake/Output this shift: Total I/O In: 280 [P.O.:240; I.V.:40] Out: -   PE: Abd: stable, soft, some distention, ostomy with air present, drain stable  Lab Results:   Recent Labs  02/11/13 0525 02/12/13 0500  WBC 6.6 5.3  HGB 9.1* 8.5*  HCT 28.3* 26.8*  PLT 434* 401*   BMET  Recent Labs  02/11/13 0525 02/12/13 0500  NA 138 140  K 3.1* 4.0  CL 102 106  CO2 25 25  GLUCOSE 100* 92  BUN 13 14  CREATININE 0.70 0.73  CALCIUM 9.6 9.9   PT/INR No results found for this basename: LABPROT, INR,  in the last 72 hours CMP     Component Value Date/Time   NA 140 02/12/2013 0500   K 4.0 02/12/2013 0500   CL 106 02/12/2013 0500   CO2 25 02/12/2013 0500   GLUCOSE 92 02/12/2013 0500   BUN 14 02/12/2013 0500   CREATININE 0.73 02/12/2013 0500   CALCIUM 9.9 02/12/2013 0500   CALCIUM 10.7* 02/01/2012 0705   PROT 6.0 02/11/2013 0525   ALBUMIN 2.3* 02/11/2013 0525   AST 15 02/11/2013 0525   ALT 8 02/11/2013 0525   ALKPHOS 78 02/11/2013 0525   BILITOT 0.1* 02/11/2013 0525   GFRNONAA >90 02/12/2013 0500   GFRAA >90 02/12/2013 0500   Lipase     Component Value Date/Time   LIPASE 8* 02/01/2013 0210       Studies/Results: Ct Abdomen Pelvis W Wo Contrast  02/10/2013   *RADIOLOGY REPORT*  Clinical Data: Painless hematuria, urinary  frequency, history of cervical cancer status post radiation, multiple bowel resections  CT ABDOMEN AND PELVIS WITHOUT AND WITH CONTRAST  Technique:  Multidetector CT imaging of the abdomen and pelvis was performed without contrast material in one or both body regions, followed by contrast material(s) and further sections in one or both body regions.  Contrast: OMNIPAQUE IOHEXOL 300 MG/ML  SOLN  Comparison: 01/28/2013  Findings: Evaluation is constrained by lack of intravenous and oral contrast.  Linear scarring at the lung bases.  Cardiomegaly.  Hypodense blood pool relative to myocardium, suggesting anemia.  Liver, spleen, pancreas, and adrenal glands are grossly unremarkable.  Status post cholecystectomy.  No intrahepatic or extrahepatic ductal dilatation.  Kidneys are grossly unremarkable.  Mild bilateral hydronephrosis, new.  Stable 2 mm nonobstructing left lower pole renal calculus (series 2/image 39).  No evidence of bowel obstruction.  Status post partial left hemicolectomy with left lower quadrant colostomy and Hartmann's pouch.  Suspected suture lines in the lower pelvis (series 2/image 8).  Nonspecific pelvic stranding/fluid.  Adjacent left lower quadrant pigtail drain.  Overall appearance of multiple matted pelvic loops of bowel is grossly unchanged but poorly evaluated in the absence of intravenous and oral contrast.  Specifically, the suspected fistula between terminal ileum  and Hartmann's pouch cannot be confirmed. No evidence of abdominal aortic aneurysm.  Small retroperitoneal lymph nodes.  Status post hysterectomy.  Thick-walled bladder.  Anasarca/body wall edema.  Visualized osseous structures are within normal limits.  IMPRESSION: Mild bilateral hydronephrosis, new.  While the exact etiology is unclear, this may be secondary to extrinsic compression by the suspected pelvic inflammatory process.  Multiple matted pelvic loops of bowel with pelvic fluid/stranding and adjacent pigtail drain,  poorly evaluated on the current study but grossly unchanged.  Status post partial left hemicolectomy with left lower quadrant colostomy and Hartmann's pouch.  Mildly thick-walled bladder, correlate for cystitis.  2 mm nonobstructing left lower pole renal calculus, unchanged.  Additional stable ancillary findings as above.   Original Report Authenticated By: Charline Bills, M.D.    Anti-infectives: Anti-infectives   Start     Dose/Rate Route Frequency Ordered Stop   02/03/13 1315  ertapenem (INVANZ) 1 g in sodium chloride 0.9 % 50 mL IVPB  Status:  Discontinued     1 g 100 mL/hr over 30 Minutes Intravenous  Once 02/03/13 1312 02/03/13 1650   02/03/13 0800  ertapenem (INVANZ) 1 g in sodium chloride 0.9 % 50 mL IVPB  Status:  Discontinued     1 g 100 mL/hr over 30 Minutes Intravenous Every 24 hours 02/03/13 0759 02/03/13 1702       Assessment/Plan  1. Prolapsed ostomy, s/p repair with recurrent non-ischemic prolapse  2. enterocolonic fistula, needs repair, scheduled for 02-16-13  3. Hypoalbuminemia  4. Bilateral lower extremity edema, now secondary to # 13, 14 5. Hematuria  6. HTN  7. H/o anemia secondary to ABL from severe thrombocytopenia from sepsis, still with anemia  8. H/o cervical cancer, s/p hysterectomy and radiation  9. PCM/TNA (prealbumin 12)  10. Hypokalemia  11. Lower extremity pain/edema  12. Bilateral hydronephrosis 13. New diagnosed cardiomyopathy with EF of 20% with global hypokinesis 14. Pulmonary hypertension 15. Right-sided femoral DVT  Plan: 1. I have spoken with Dr. [name] who has indefinitely put her surgery off for now.  The patient knows and understands why and is ok with this. 2. Started on therapeutic lovenox yesterday, since no surgery will have pharmacy start coumadin today.  The patient has a significant bleeding history.  We will have to follow her labs very closely initially to make sure her platelets and her hgb stay stable. 3. Greatly appreciate  medicine and cardiology's assistance with this patient. 4. Plan for myoview this admission per cardiology and medicine.  D/w Dr. Isidoro Donning this morning 5. Cont diuresis per cardiology 6. Will need to set up [facility] for TNA at home.  She may need other services, we'll see 7. No treatment plans for now for hydronephrosis.  Will need outpatient cystoscopy. 8. K better today, will cont to follow especially since getting lasix 9. Cont diet and TNA 10. Check labs in the am.   LOS: 9 days    Rasheka Denard E 02/12/2013, 10:48 AM Pager: [phone]

## 2013-02-12 NOTE — Progress Notes (Addendum)
ANTICOAGULATION CONSULT NOTE - Initial Consult  Pharmacy Consult for Coumadin Indication: DVT  Allergies  Allergen Reactions  . Penicillins Other (See Comments)    "anything w/penicillin in it makes me bleed"  . Labetalol Hcl Itching and Other (See Comments)    Bumps to bilateral arms.  . Morphine And Related Hives    Patient Measurements: Height: 5\' 4"  (162.6 cm) Weight: 131 lb 8 oz (59.648 kg) IBW/kg (Calculated) : 54.7  Vital Signs: Temp: 97.8 F (36.6 C) (05/23 0504) Temp src: Oral (05/23 0504) BP: 125/83 mmHg (05/23 0504) Pulse Rate: 74 (05/23 0504)  Labs:  Recent Labs  02/10/13 0500 02/11/13 0525 02/12/13 0500 02/12/13 1140  HGB 8.7* 9.1* 8.5*  --   HCT 27.0* 28.3* 26.8*  --   PLT 436* 434* 401*  --   LABPROT  --   --   --  14.5  INR  --   --   --  1.15  CREATININE 0.69 0.70 0.73  --     Estimated Creatinine Clearance: 83.1 ml/min (by C-G formula based on Cr of 0.73).   Medical History: Past Medical History  Diagnosis Date  . History of blood transfusion     "I had ~ 7 in 01/2012"  . Anemia   . Cervical cancer ~ 2011    cervical  . Radiation Nov.18,2011-Jan.23,2012    cervix  . Hypertension     no treatment necessary for BP  since July 2013    Medications:  Prescriptions prior to admission  Medication Sig Dispense Refill  . feeding supplement (ENSURE COMPLETE) LIQD Take 237 mLs by mouth 2 (two) times daily between meals.      . magnesium oxide (MAG-OX) 400 (241.3 MG) MG tablet Take 1 tablet (400 mg total) by mouth daily.  30 tablet  1  . metoCLOPramide (REGLAN) 10 MG tablet Take 1 tablet (10 mg total) by mouth 4 (four) times daily as needed (nausea).  30 tablet  0  . ondansetron (ZOFRAN ODT) 8 MG disintegrating tablet Take 1 tablet (8 mg total) by mouth every 8 (eight) hours as needed for nausea.  20 tablet  0  . oxyCODONE-acetaminophen (PERCOCET) 10-325 MG per tablet Take 1 tablet by mouth every 6 (six) hours as needed for pain.  45 tablet  0   . potassium chloride SA (K-DUR,KLOR-CON) 20 MEQ tablet Take 1 tablet (20 mEq total) by mouth 3 (three) times daily.  90 tablet  0    Assessment: 37 y/o female found to have an age indeterminate RLE DVT on doppler 5/22- Lovenox was initiaited last PM, now with orders to start Coumadin. Baseline INR nml and patient not previously on anticoagulants. Noted ECF repair/surgery has been indefinitely put off for now. H/H decreased, but has been low-stable. Plts elevated. Noted patient was having hematuria; urology assessment noted, no acute problems, plans for retrograde pyelograms.  Goal of Therapy:  INR 2-3 Monitor platelets by anticoagulation protocol: Yes Overlap with Lovenox for a minimum of 5 days (new VTE): today is overlap day #1   Plan:  - PT INR now and daily - Coumadin book and video to initiate education - Coumadin 5mg  PO x1 today - Will f/up daily  Thanks, Edgard Debord K. Allena Katz, PharmD, BCPS.  Clinical Pharmacist Pager [phone]. 02/12/2013 11:12 AM

## 2013-02-12 NOTE — Progress Notes (Signed)
Patient ID: Vanessa [name]  female  ZOX:[id number]    DOB: [birth year]    DOA: 02/03/2013  PCP: Dorrene German, MD  Assessment/Plan: Hypertension: BP stable  Cardiomyopathy with EF of 20%, global hypokinesis and Peripheral edema:  - Appreciate cardiology for evaluating her, will continue Lasix for negative balance as she is receiving TPN as well - Hopefully, if possible inpatient stress test prior to DC, otherwise possibility of losing followup - On Coreg and ACEI, BIDIL  Acute DVT right lower extremity - Agree with Coumadin and Lovenox as she possibly will need further surgeries and with her bleeding history, easier to reverse with Coumadin   Mild bilateral hydronephrosis: Renal function is normal  - Urology following  Thrombocytosis: Likely reactive this appears be stable.   prolapsed ostomy, s/p repair with recurrent non-ischemic prolapse  - Per CCS  DVT Prophylaxis:  Code Status:  Disposition: per primary service. I will continue to follow her closely.    Subjective: She is feeling better today, urinating a lot with starting Lasix yesterday  Objective: Weight change:   Intake/Output Summary (Last 24 hours) at 02/12/13 1536 Last data filed at 02/12/13 1400  Gross per 24 hour  Intake   1720 ml  Output   1900 ml  Net   -180 ml   Blood pressure 127/82, pulse 79, temperature 97.9 F (36.6 C), temperature source Oral, resp. rate 16, height 5\' 4"  (1.626 m), weight 67.1 kg (147 lb 14.9 oz), SpO2 100.00%.  Physical Exam: General: Alert and awake, oriented x3, NADs. CVS: S1-S2 clear, no murmur rubs or gallops Chest: CTAB Abdomen: soft mild distended and tender around her ostomy Extremities: no cyanosis, clubbing. 2+ pitting edema bilaterally   Lab Results: Basic Metabolic Panel:  Recent Labs Lab 02/11/13 0525 02/12/13 0500  NA 138 140  K 3.1* 4.0  CL 102 106  CO2 25 25  GLUCOSE 100* 92  BUN 13 14  CREATININE 0.70 0.73  CALCIUM 9.6 9.9  MG 1.8 2.1   PHOS 3.8  --    Liver Function Tests:  Recent Labs Lab 02/09/13 0622 02/11/13 0525  AST 15 15  ALT 10 8  ALKPHOS 90 78  BILITOT 0.2* 0.1*  PROT 6.1 6.0  ALBUMIN 2.5* 2.3*   No results found for this basename: LIPASE, AMYLASE,  in the last 168 hours No results found for this basename: AMMONIA,  in the last 168 hours CBC:  Recent Labs Lab 02/09/13 0622  02/11/13 0525 02/12/13 0500  WBC 7.7  < > 6.6 5.3  NEUTROABS 5.2  --   --   --   HGB 9.2*  < > 9.1* 8.5*  HCT 28.4*  < > 28.3* 26.8*  MCV 89.3  < > 90.1 91.8  PLT 500*  < > 434* 401*  < > = values in this interval not displayed. Cardiac Enzymes: No results found for this basename: CKTOTAL, CKMB, CKMBINDEX, TROPONINI,  in the last 168 hours BNP: No components found with this basename: POCBNP,  CBG:  Recent Labs Lab 02/09/13 2327 02/10/13 0334 02/10/13 0737 02/10/13 1154 02/10/13 1814  GLUCAP 117* 119* 102* 117* 98     Micro Results: Recent Results (from the past 240 hour(s))  URINE CULTURE     Status: None   Collection Time    02/07/13  4:59 PM      Result Value Range Status   Specimen Description URINE, CLEAN CATCH   Final   Special Requests NONE   Final  Culture  Setup Time 02/07/2013 22:10   Final   Colony Count >=100,000 COLONIES/ML   Final   Culture     Final   Value: Multiple bacterial morphotypes present, none predominant. Suggest appropriate recollection if clinically indicated.   Report Status 02/08/2013 FINAL   Final    Studies/Results: Ct Abdomen Pelvis W Contrast  01/28/2013   *RADIOLOGY REPORT*  Clinical Data: Left lower quadrant abdominal pain with lower extremity and abdominal edema.  Left lower quadrant drain placed 7 months ago.  History of cervical cancer, radiation and chemotherapy, sigmoid colectomy with ostomy.  CT ABDOMEN AND PELVIS WITH CONTRAST  Technique:  Multidetector CT imaging of the abdomen and pelvis was performed following the standard protocol during bolus administration  of intravenous contrast.  Contrast: 80mL OMNIPAQUE IOHEXOL 300 MG/ML  SOLN  Comparison: Abdominal pelvic CT 12/03/2012.  Barium enema 01/18/2013.  Findings: Images through the lung bases demonstrate stable cardiomegaly and linear scarring or atelectasis at both lung bases. There are trace bilateral pleural effusions.  There is mild ascites with diffuse soft tissue edema consistent with anasarca.  The abdomen is imaged prior to portal vein opacification.  There is some retrograde filling of the IVC and hepatic veins.  The hepatic steatosis has improved compared with the most recent study.  The previously noted low density lesions in the right and left hepatic lobes are grossly stable. The gallbladder is surgically absent. There is a stable nonobstructing calculus in the lower pole of the left kidney.  There is no hydronephrosis.  The right kidney, spleen, pancreas and adrenal glands appear normal.  Descending colostomy and left lower quadrant percutaneous drain appear unchanged.  Evaluation of the pelvic anatomy is limited by the anasarca and arterial phase imaging.  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 There is likely a small amount of residual extraluminal air and fluid in the left lower quadrant inferior and anterior to the surgical drain.  This appears grossly unchanged.  There is diffuse bladder wall thickening.  There is diffuse thickening of the wall of the rectosigmoid colon and perirectal soft tissue stranding. There is still some air within the vagina, but no contrast.  There are no worrisome osseous findings.  IMPRESSION:  1.  Progressive anasarca with diffuse soft tissue edema, ascites and trace pleural effusions. 2.  Cardiomegaly with reflux of contrasted blood into the IVC and hepatic veins suggesting a degree of right  heart failure. 3.  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  No large drainable collection identified.   Original Report Authenticated By: Carey Bullocks, M.D.   Dg Abd 2 Views  02/08/2013   *RADIOLOGY REPORT*  Clinical Data: Prolapsing colostomy and abdominal distention.  ABDOMEN - 2 VIEW  Comparison: 02/01/2013  Findings: There is a left lower quadrant colostomy.  Left lower quadrant pigtail drainage catheter is identified in the projection of the left iliac bone.  There is moderate gaseous distention of the transverse colon.  A few prominent small bowel loops are noted which measure up to 2.8 cm.  IMPRESSION:  1.  Moderate gaseous distention of the transverse colon. 2.  Pigtail drainage catheter projects over the left iliac bone and is presumably within the left iliac fossa as before.  Original Report Authenticated By: Signa Kell, M.D.   Dg Abd Acute W/chest  02/01/2013   *RADIOLOGY REPORT*  Clinical Data: Abdominal pain  ACUTE ABDOMEN SERIES (ABDOMEN 2 VIEW & CHEST 1 VIEW)  Comparison: 01/28/2013 CT  Findings: Cardiomegaly.  Central vascular congestion. No focal consolidation.  Surgical clips right upper quadrant.  No free intraperitoneal air. Surgical drain projects over the left lower quadrant.  There is air within normal caliber colon and a small amount of contrast within the left lower colon projecting just above the colostomy. There is also high attenuation over the colostomy, most in keeping with ingested contrast from the recent comparison. 2 small nonspecific metallic densities project over the mid pelvis. Gentle leftward curvature of the spine.  No acute osseous finding.  IMPRESSION: Surgical drain projects over the left lower abdomen/upper pelvis.  Nonspecific bowel gas pattern without overt obstruction.   Original Report Authenticated By: Jearld Lesch,  M.D.   Dg Colon W/cm - Wo/w Kub  01/18/2013   *RADIOLOGY REPORT*  Clinical Data: No complex surgical history.  The patient has a cervical cancer radiation therapy the pelvis.  Subsequent a diverting colostomy and Hartmann's pouch.  Persistent drainage of stool through the Hartmann's pouch.  The patient additionally has a percutaneous drain in the pelvis.  The  SINGLE COLUMN BARIUM ENEMA  Technique:  Initial scout AP supine abdominal image obtained to insure adequate colon cleansing.  Barium was introduced into the colon in a retrograde fashion and refluxed from the rectum to the cecum. Spot images of the colon followed by overhead radiographs were obtained.  Fluoroscopy time: 2 minutes 29 seconds  Comparison:  CT 12/03/2012, and fistulogram, 08/25/2012, small bowel follow through the 09/20/2012  Findings:  A Foley catheter was inserted per rectum and 5 cc  gas insufflated into the retention balloon.  Under fluoroscopic observation are soluble contrast was administered via gravity feed per rectum.  Contrast filled the Hartmann's pouch.  Contrast was quickly seen within the cecum.  Contrast was also quickly seen within the distal small bowel.  Contrast flowed antegrade into the ascending and transverse colon to the level of the splenic flexure. The is high density contrast within the terminal ileum.  No significant contrast flowed into the percutaneous drain.  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  Findings discussed with Dr. [name] on 01/18/2013.   Original Report Authenticated By: Genevive Bi, M.D.    Medications: Scheduled Meds: . carvedilol  3.125 mg Oral BID WC  . enoxaparin (LOVENOX) injection  60 mg Subcutaneous Q12H  . feeding supplement  237 mL Oral BID BM  . furosemide  40 mg Intravenous BID  .  isosorbide-hydrALAZINE  1 tablet Oral BID  . lisinopril  10 mg Oral Daily  . pantoprazole  40 mg Oral QHS  . traMADol  50 mg Oral Q6H  . warfarin  5 mg Oral ONCE-1800  . Warfarin - Pharmacist Dosing Inpatient   Does not apply q1800      LOS: 9 days   Odes Lolli M.D. [facility] 02/12/2013, 3:36 PM Pager: [phone]  If 7PM-7AM, please contact night-coverage [email or web address] Password [name]

## 2013-02-13 DIAGNOSIS — I5043 Acute on chronic combined systolic (congestive) and diastolic (congestive) heart failure: Secondary | ICD-10-CM

## 2013-02-13 DIAGNOSIS — I10 Essential (primary) hypertension: Secondary | ICD-10-CM

## 2013-02-13 LAB — PHOSPHORUS: Phosphorus: 4.6 mg/dL (ref 2.3–4.6)

## 2013-02-13 LAB — PROTIME-INR: Prothrombin Time: 14.4 seconds (ref 11.6–15.2)

## 2013-02-13 LAB — CBC
HCT: 26 % — ABNORMAL LOW (ref 36.0–46.0)
Hemoglobin: 8.4 g/dL — ABNORMAL LOW (ref 12.0–15.0)
MCH: 29.1 pg (ref 26.0–34.0)
MCV: 90 fL (ref 78.0–100.0)
RBC: 2.89 MIL/uL — ABNORMAL LOW (ref 3.87–5.11)

## 2013-02-13 LAB — BASIC METABOLIC PANEL
BUN: 20 mg/dL (ref 6–23)
CO2: 26 mEq/L (ref 19–32)
Chloride: 104 mEq/L (ref 96–112)
Creatinine, Ser: 0.86 mg/dL (ref 0.50–1.10)
Glucose, Bld: 89 mg/dL (ref 70–99)

## 2013-02-13 MED ORDER — WARFARIN SODIUM 5 MG PO TABS
5.0000 mg | ORAL_TABLET | Freq: Once | ORAL | Status: AC
  Administered 2013-02-13: 5 mg via ORAL
  Filled 2013-02-13: qty 1

## 2013-02-13 MED ORDER — CARVEDILOL 6.25 MG PO TABS
6.2500 mg | ORAL_TABLET | Freq: Two times a day (BID) | ORAL | Status: DC
  Administered 2013-02-13 – 2013-02-19 (×11): 6.25 mg via ORAL
  Filled 2013-02-13 (×13): qty 1

## 2013-02-13 MED ORDER — CLINIMIX E/DEXTROSE (5/15) 5 % IV SOLN
INTRAVENOUS | Status: AC
  Administered 2013-02-13: 18:00:00 via INTRAVENOUS
  Filled 2013-02-13: qty 2000

## 2013-02-13 NOTE — Progress Notes (Signed)
10 Days Post-Op   Assessment: s/p Procedure(s): COLOSTOMY REVISION Patient Active Problem List   Diagnosis Date Noted  . DVT, femoral, acute 02/12/2013  . 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 Pulmonary HTN, severe 02/11/2013  . Bilateral lower extremity edema 02/11/2013  . Essential hypertension, benign 02/08/2013  . Thrombocytosis 02/08/2013  . 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 Postop check 09/22/2012  . 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 S/P left/sigmoid colectomy with anastomotic leak- ileostomy 03/05/2012  . Ileus following gastrointestinal surgery 03/05/2012  . On total parenteral nutrition 03/05/2012  . Abscess, peritoneal 03/05/2012  . Hemorrhage of rectum and anus 02/20/2012  . Acute posthemorrhagic anemia 02/20/2012  . Thrombocytopenia due to medication 02/20/2012  . 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 Pyelonephritis 01/27/2012  . Nausea & vomiting 01/27/2012  . Hypercalcemia 01/27/2012  . Cervical cancer 01/07/2012  . Post-radiation rectal bleeding 01/07/2012    Stable from surgical standpoint  Plan: No changes today  Subjective: Feels OK, having BM's via rectum and some air out colostomy  Objective: Vital signs in last 24 hours: Temp:  [97.6 F (36.4 C)-97.9 F (36.6 C)] 97.6 F (36.4 C) (05/24 0500) Pulse Rate:  [78-79] 78 (05/24 0500) Resp:  [16-19] 19 (05/24 0500) BP: (118-127)/(82-92) 118/86 mmHg (05/24 0500) SpO2:  [100 %] 100 % (05/24 0500) Weight:  [145 lb 15.1 oz (66.2 kg)] 145 lb 15.1 oz (66.2 kg) (05/24 0500)   Intake/Output from previous day: 05/23 0701 - 05/24 0700 In: 2160 [P.O.:1920; I.V.:240] Out: 2315 [Urine:2300; Drains:15]  General appearance: alert, cooperative, fatigued and no distress Resp: clear to auscultation bilaterally GI: Soft, not tender, not distended,  wounds healed, ostomy pink somewhat edematous, air coming out  Incision: healing well  Lab Results:   Recent Labs  02/12/13 0500 02/13/13 0605  WBC 5.3 5.8  HGB 8.5* 8.4*  HCT 26.8* 26.0*  PLT 401* 413*   BMET  Recent Labs  02/12/13 0500 02/13/13 0605  NA 140 140  K 4.0 3.6  CL 106 104  CO2 25 26  GLUCOSE 92 89  BUN 14 20  CREATININE 0.73 0.86  CALCIUM 9.9 9.9    MEDS, Scheduled . carvedilol  6.25 mg Oral BID WC  . enoxaparin (LOVENOX) injection  60 mg Subcutaneous Q12H  . feeding supplement  237 mL Oral BID BM  . furosemide  40 mg Intravenous BID  . isosorbide-hydrALAZINE  1 tablet Oral BID  . lisinopril  10 mg Oral Daily  . pantoprazole  40 mg Oral QHS  . traMADol  50 mg Oral Q6H  . warfarin  5 mg Oral ONCE-1800  . Warfarin - Pharmacist Dosing Inpatient   Does not apply q1800    Studies/Results: No results found.    LOS: 10 days     Currie Paris, MD, [facility], [place] [phone]   02/13/2013 11:03 AM

## 2013-02-13 NOTE — Progress Notes (Addendum)
PARENTERAL NUTRITION CONSULT NOTE - Follow up  Pharmacy Consult for TPN Indication: complex GI history with pending fistula repair  Allergies  Allergen Reactions  . Penicillins Other (See Comments)    "anything w/penicillin in it makes me bleed"  . Labetalol Hcl Itching and Other (See Comments)    Bumps to bilateral arms.  . Morphine And Related Hives    Patient Measurements: Height: 5\' 4"  (162.6 cm) Weight: 145 lb 15.1 oz (66.2 kg) IBW/kg (Calculated) : 54.7  Vital Signs: Temp: 97.6 F (36.4 C) (05/24 0500) Temp src: Oral (05/24 0500) BP: 118/86 mmHg (05/24 0500) Pulse Rate: 78 (05/24 0500) Intake/Output from previous day: 05/23 0701 - 05/24 0700 In: 2160 [P.O.:1920; I.V.:240] Out: 2315 [Urine:2300; Drains:15] Intake/Output from this shift:    Labs:  Recent Labs  02/11/13 0525 02/12/13 0500 02/12/13 1140 02/13/13 0605  WBC 6.6 5.3  --  5.8  HGB 9.1* 8.5*  --  8.4*  HCT 28.3* 26.8*  --  26.0*  PLT 434* 401*  --  413*  INR  --   --  1.15 1.14     Recent Labs  02/11/13 0525 02/12/13 0500 02/13/13 0605  NA 138 140 140  K 3.1* 4.0 3.6  CL 102 106 104  CO2 25 25 26   GLUCOSE 100* 92 89  BUN 13 14 20   CREATININE 0.70 0.73 0.86  CALCIUM 9.6 9.9 9.9  MG 1.8 2.1 1.8  PHOS 3.8  --  4.6  PROT 6.0  --   --   ALBUMIN 2.3*  --   --   AST 15  --   --   ALT 8  --   --   ALKPHOS 78  --   --   BILITOT 0.1*  --   --    Estimated Creatinine Clearance: 83.8 ml/min (by C-G formula based on Cr of 0.86).    Recent Labs  02/10/13 1154 02/10/13 1814  GLUCAP 117* 98    Medical History: Past Medical History  Diagnosis Date  . History of blood transfusion     "I had ~ 7 in 01/2012"  . Anemia   . Cervical cancer ~ 2011    cervical  . Radiation Nov.18,2011-Jan.23,2012    cervix  . Hypertension     no treatment necessary for BP  since July 2013    Insulin Requirements in the past 24 hours:  None- SSI has been discontinued  Current Nutrition:  Full  liquid diet- noted no % intake charted yesterday, but appetite assessed as "good", also receiving Ensure BID BM; Clinimix E 5/15 on 18 hours cycling- 28mL/hr x1 hour, then 152mL/hr over 16 hours, then 75mL/hr x1 hour for a total of over 18 hours. Provides 95g of protein/day and average of 1560kcal/day with fats given 3x week.   Assessment: Patient known to pharmacy from past TPN consults. She has a prolapsed ostomy which is s/p repair and is awaiting a fistula repair (scheduled for 5/27). Patient has extensive GI history. Was being worked up for possible obstruction but there does not appear to be one. Through conversation with RD and surgery PA, desire TPN at this point to ensure patient stays nutritionally stable prior to future procedures. Note plans to hold off on surgery indefinitely until nutritional status is improved  Nutritional Goals:  1800-1920 kCal, 80-95 grams of protein per day per RD recommendations 5/23  GI: s/p prolapsed colectomy revision, appears no obstruction, some BS and abd is soft but bloated, less nauseous.  Today patient states stoma looks like it did prior to surgery- red and protruding. Watery stool. PO PPI  Endo: no hx, has not required insulin in her TPN in the past, SSI has been discontinued  Lytes: K 3.6- will need to watch closely as patient now on scheduled IV Lasix (also on lisinopril), phos increased to 4.6, mag 1.8, corrected Ca 10.9, other lytes nml  Renal: SCr 0.86, CrCl ~6mL/min; has half NS running at 10-33mL/hr; urology recommends cytoscopy and retrograde pyelograms in OR to fully finish evaluation for hematuria  Pulm: RA  Cards: seen by cards for cardiomyopathy (EF 5/10 20%; pro-BNP >2000 5/22)- would like to perform Myoview, but will hold off d/t scheduled surgery next week. For now will be controlling BP and reducing afterload- VSS, on Lasix IV BID, BiDil, lisinopril, and Lopressor  Hepatobil: alkphos, AST, ALT all nml, albumin low at 2.3, TBili  low at 0.1; baseline prealbumin 12.1, recheck 11.7 (only 1 day after baseline check); baseline trigs 71  Neuro: no issues  ID: WBC 5.3, afebrile, no abx  Best Practices: Lovenox 60 q12- age indeterminate DVT found on doppler 5/22, also on warfarin  TPN Access: PICC placed 5/19 TPN day#: 4 (started 5/19)   Plan:  - continue to cycle Clinimix E 5/15, now will decrease cycle to 12 hours- 22mL/hr x1 hour, then 135mL/hr over 10 hours, then 79mL/hr x1 hour for a total of over 12 hours.  - will provide MVI/trace and IV Lipids MWF only due to ongoing shortages - will continue IVF as half NS at 10-56mL/hr - will follow up diet progression - BMET, mag and phos ordered for the morning to assess cycle tolerance- will watch closely as patient is on diuretics also   Amjad Fikes D. Vernesha Talbot, PharmD Clinical Pharmacist Pager: [phone] 02/13/2013 9:49 AM

## 2013-02-13 NOTE — Progress Notes (Signed)
Patient ID: Vanessa [name]  female  QIO:[id number]    DOB: 1976-08-04    DOA: 02/03/2013  PCP: Dorrene German, MD  Assessment/Plan: Hypertension: BP stable  Cardiomyopathy with EF of 20%, global hypokinesis and Peripheral edema:  - Appreciate cardiology for evaluating her, will continue Lasix for negative balance as she is receiving TPN as well, transition to oral tomorrow  - Hopefully, if possible inpatient stress test prior to DC - On Coreg and ACEI, BIDIL, lasix. Cr function stable  Acute DVT right lower extremity - Agree with Coumadin and Lovenox as she possibly will need further surgeries and with her bleeding history, easier to reverse with Coumadin   Mild bilateral hydronephrosis: Renal function is normal  - Urology following  Thrombocytosis: Likely reactive this appears be stable.   prolapsed ostomy, s/p repair with recurrent non-ischemic prolapse  - Per CCS  DVT Prophylaxis:  Code Status:  Disposition: per primary service. I will continue to follow her closely.      Subjective: Feeling a whole lot better after starting lasix  Objective: Weight change:   Intake/Output Summary (Last 24 hours) at 02/13/13 1131 Last data filed at 02/13/13 1129  Gross per 24 hour  Intake   1610 ml  Output   1415 ml  Net    195 ml   Blood pressure 118/86, pulse 78, temperature 97.6 F (36.4 C), temperature source Oral, resp. rate 19, height 5\' 4"  (1.626 m), weight 66.2 kg (145 lb 15.1 oz), SpO2 100.00%.  Physical Exam: General: Alert and awake, oriented x3, NADs. CVS: S1-S2 clear, no murmur rubs or gallops Chest: CTAB Abdomen: soft mild distended and tender around her ostomy Extremities: no cyanosis, clubbing. 1+ pitting edema bilaterally- improving significantly    Lab Results: Basic Metabolic Panel:  Recent Labs Lab 02/12/13 0500 02/13/13 0605  NA 140 140  K 4.0 3.6  CL 106 104  CO2 25 26  GLUCOSE 92 89  BUN 14 20  CREATININE 0.73 0.86  CALCIUM 9.9 9.9  MG  2.1 1.8  PHOS  --  4.6   Liver Function Tests:  Recent Labs Lab 02/09/13 0622 02/11/13 0525  AST 15 15  ALT 10 8  ALKPHOS 90 78  BILITOT 0.2* 0.1*  PROT 6.1 6.0  ALBUMIN 2.5* 2.3*   No results found for this basename: LIPASE, AMYLASE,  in the last 168 hours No results found for this basename: AMMONIA,  in the last 168 hours CBC:  Recent Labs Lab 02/09/13 0622  02/12/13 0500 02/13/13 0605  WBC 7.7  < > 5.3 5.8  NEUTROABS 5.2  --   --   --   HGB 9.2*  < > 8.5* 8.4*  HCT 28.4*  < > 26.8* 26.0*  MCV 89.3  < > 91.8 90.0  PLT 500*  < > 401* 413*  < > = values in this interval not displayed. Cardiac Enzymes: No results found for this basename: CKTOTAL, CKMB, CKMBINDEX, TROPONINI,  in the last 168 hours BNP: No components found with this basename: POCBNP,  CBG:  Recent Labs Lab 02/09/13 2327 02/10/13 0334 02/10/13 0737 02/10/13 1154 02/10/13 1814  GLUCAP 117* 119* 102* 117* 98     Micro Results: Recent Results (from the past 240 hour(s))  URINE CULTURE     Status: None   Collection Time    02/07/13  4:59 PM      Result Value Range Status   Specimen Description URINE, CLEAN CATCH   Final   Special Requests  NONE   Final   Culture  Setup Time 02/07/2013 22:10   Final   Colony Count >=100,000 COLONIES/ML   Final   Culture     Final   Value: Multiple bacterial morphotypes present, none predominant. Suggest appropriate recollection if clinically indicated.   Report Status 02/08/2013 FINAL   Final    Studies/Results: Ct Abdomen Pelvis W Contrast  01/28/2013   *RADIOLOGY REPORT*  Clinical Data: Left lower quadrant abdominal pain with lower extremity and abdominal edema.  Left lower quadrant drain placed 7 months ago.  History of cervical cancer, radiation and chemotherapy, sigmoid colectomy with ostomy.  CT ABDOMEN AND PELVIS WITH CONTRAST  Technique:  Multidetector CT imaging of the abdomen and pelvis was performed following the standard protocol during bolus  administration of intravenous contrast.  Contrast: 80mL OMNIPAQUE IOHEXOL 300 MG/ML  SOLN  Comparison: Abdominal pelvic CT 12/03/2012.  Barium enema 01/18/2013.  Findings: Images through the lung bases demonstrate stable cardiomegaly and linear scarring or atelectasis at both lung bases. There are trace bilateral pleural effusions.  There is mild ascites with diffuse soft tissue edema consistent with anasarca.  The abdomen is imaged prior to portal vein opacification.  There is some retrograde filling of the IVC and hepatic veins.  The hepatic steatosis has improved compared with the most recent study.  The previously noted low density lesions in the right and left hepatic lobes are grossly stable. The gallbladder is surgically absent. There is a stable nonobstructing calculus in the lower pole of the left kidney.  There is no hydronephrosis.  The right kidney, spleen, pancreas and adrenal glands appear normal.  Descending colostomy and left lower quadrant percutaneous drain appear unchanged.  Evaluation of the pelvic anatomy is limited by the anasarca and arterial phase imaging.  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 There is likely a small amount of residual extraluminal air and fluid in the left lower quadrant inferior and anterior to the surgical drain.  This appears grossly unchanged.  There is diffuse bladder wall thickening.  There is diffuse thickening of the wall of the rectosigmoid colon and perirectal soft tissue stranding. There is still some air within the vagina, but no contrast.  There are no worrisome osseous findings.  IMPRESSION:  1.  Progressive anasarca with diffuse soft tissue edema, ascites and trace pleural effusions. 2.  Cardiomegaly with reflux of contrasted blood into the IVC and hepatic veins suggesting a  degree of right heart failure. 3.  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  No large drainable collection identified.   Original Report Authenticated By: Carey Bullocks, M.D.   Dg Abd 2 Views  02/08/2013   *RADIOLOGY REPORT*  Clinical Data: Prolapsing colostomy and abdominal distention.  ABDOMEN - 2 VIEW  Comparison: 02/01/2013  Findings: There is a left lower quadrant colostomy.  Left lower quadrant pigtail drainage catheter is identified in the projection of the left iliac bone.  There is moderate gaseous distention of the transverse colon.  A few prominent small bowel loops are noted which measure up to 2.8 cm.  IMPRESSION:  1.  Moderate gaseous distention of the transverse colon. 2.  Pigtail drainage catheter projects over the left iliac bone and is presumably within the  left iliac fossa as before.   Original Report Authenticated By: Signa Kell, M.D.   Dg Abd Acute W/chest  02/01/2013   *RADIOLOGY REPORT*  Clinical Data: Abdominal pain  ACUTE ABDOMEN SERIES (ABDOMEN 2 VIEW & CHEST 1 VIEW)  Comparison: 01/28/2013 CT  Findings: Cardiomegaly.  Central vascular congestion. No focal consolidation.  Surgical clips right upper quadrant.  No free intraperitoneal air. Surgical drain projects over the left lower quadrant.  There is air within normal caliber colon and a small amount of contrast within the left lower colon projecting just above the colostomy. There is also high attenuation over the colostomy, most in keeping with ingested contrast from the recent comparison. 2 small nonspecific metallic densities project over the mid pelvis. Gentle leftward curvature of the spine.  No acute osseous finding.  IMPRESSION: Surgical drain projects over the left lower abdomen/upper pelvis.  Nonspecific bowel gas pattern without overt obstruction.   Original Report Authenticated By:  Jearld Lesch, M.D.   Dg Colon W/cm - Wo/w Kub  01/18/2013   *RADIOLOGY REPORT*  Clinical Data: No complex surgical history.  The patient has a cervical cancer radiation therapy the pelvis.  Subsequent a diverting colostomy and Hartmann's pouch.  Persistent drainage of stool through the Hartmann's pouch.  The patient additionally has a percutaneous drain in the pelvis.  The  SINGLE COLUMN BARIUM ENEMA  Technique:  Initial scout AP supine abdominal image obtained to insure adequate colon cleansing.  Barium was introduced into the colon in a retrograde fashion and refluxed from the rectum to the cecum. Spot images of the colon followed by overhead radiographs were obtained.  Fluoroscopy time: 2 minutes 29 seconds  Comparison:  CT 12/03/2012, and fistulogram, 08/25/2012, small bowel follow through the 09/20/2012  Findings:  A Foley catheter was inserted per rectum and 5 cc  gas insufflated into the retention balloon.  Under fluoroscopic observation are soluble contrast was administered via gravity feed per rectum.  Contrast filled the Hartmann's pouch.  Contrast was quickly seen within the cecum.  Contrast was also quickly seen within the distal small bowel.  Contrast flowed antegrade into the ascending and transverse colon to the level of the splenic flexure. The is high density contrast within the terminal ileum.  No significant contrast flowed into the percutaneous drain.  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  Findings discussed with Dr. [name] on 01/18/2013.   Original Report Authenticated By: Genevive Bi, M.D.    Medications: Scheduled Meds: . carvedilol  6.25 mg Oral BID WC  . enoxaparin (LOVENOX) injection  60 mg Subcutaneous Q12H  . feeding supplement  237 mL Oral BID BM  . furosemide  40 mg  Intravenous BID  . isosorbide-hydrALAZINE  1 tablet Oral BID  . lisinopril  10 mg Oral Daily  . pantoprazole  40 mg Oral QHS  . traMADol  50 mg Oral Q6H  . warfarin  5 mg Oral ONCE-1800  . Warfarin - Pharmacist Dosing Inpatient   Does not apply q1800      LOS: 10 days   Emiya Loomer M.D. [facility] 02/13/2013, 11:31 AM Pager: [phone]  If 7PM-7AM, please contact night-coverage [email or web address] Password [name]

## 2013-02-13 NOTE — Progress Notes (Signed)
[facility] Progress Note  Subjective:  Breathing better  Objective:   Vital Signs in the last 24 hours: Temp:  [97.6 F (36.4 C)-97.9 F (36.6 C)] 97.6 F (36.4 C) (05/24 0500) Pulse Rate:  [78-79] 78 (05/24 0500) Resp:  [16-19] 19 (05/24 0500) BP: (118-127)/(82-92) 118/86 mmHg (05/24 0500) SpO2:  [100 %] 100 % (05/24 0500) Weight:  [145 lb 15.1 oz (66.2 kg)-147 lb 14.9 oz (67.1 kg)] 145 lb 15.1 oz (66.2 kg) (05/24 0500)  Intake/Output from previous day: 05/23 0701 - 05/24 0700 In: 2160 [P.O.:1920; I.V.:240] Out: 2315 [Urine:2300; Drains:15]  Scheduled: . carvedilol  3.125 mg Oral BID WC  . enoxaparin (LOVENOX) injection  60 mg Subcutaneous Q12H  . feeding supplement  237 mL Oral BID BM  . furosemide  40 mg Intravenous BID  . isosorbide-hydrALAZINE  1 tablet Oral BID  . lisinopril  10 mg Oral Daily  . pantoprazole  40 mg Oral QHS  . traMADol  50 mg Oral Q6H  . Warfarin - Pharmacist Dosing Inpatient   Does not apply q1800   . sodium chloride 20 mL/hr (02/09/13 1909)  . Marland KitchenTPN (CLINIMIX-E) Adult 112 mL/hr at 02/12/13 1900   And  . fat emulsion 240 mL (02/12/13 1900)   Physical Exam:   General appearance: alert, cooperative and no distress Neck: no adenopathy, no carotid bruit, no JVD and supple, symmetrical, trachea midline Lungs: clear to auscultation bilaterally Heart: regular rate and rhythm and 1/6 sem Abdomen: soft, colostomy in place Extremities: 1+ ankle edema   Rate: 80  Rhythm: normal sinus rhythm  Lab Results:    Recent Labs  02/12/13 0500 02/13/13 0605  NA 140 140  K 4.0 3.6  CL 106 104  CO2 25 26  GLUCOSE 92 89  BUN 14 20  CREATININE 0.73 0.86   CBC    Component Value Date/Time   WBC 5.8 02/13/2013 0605   WBC 5.3 10/22/2010 1258   RBC 2.89* 02/13/2013 0605   RBC 3.48* 10/22/2010 1258   HGB 8.4* 02/13/2013 0605   HGB 11.2* 10/22/2010 1258   HCT 26.0* 02/13/2013 0605   HCT 33.1* 10/22/2010 1258   PLT 413*  02/13/2013 0605   PLT 287 10/22/2010 1258   MCV 90.0 02/13/2013 0605   MCV 94.9 10/22/2010 1258   MCH 29.1 02/13/2013 0605   MCH 29.1 09/19/2010 0847   MCHC 32.3 02/13/2013 0605   MCHC 33.8 10/22/2010 1258   RDW 16.0* 02/13/2013 0605   RDW 25.0* 10/22/2010 1258   LYMPHSABS 1.4 02/09/2013 0622   LYMPHSABS 0.5* 10/22/2010 1258   MONOABS 0.8 02/09/2013 0622   MONOABS 0.6 10/22/2010 1258   EOSABS 0.2 02/09/2013 0622   EOSABS 0.1 10/22/2010 1258   BASOSABS 0.1 02/09/2013 0622   BASOSABS 0.0 10/22/2010 1258    No results found for this basename: TROPONINI, CK, MB,  in the last 72 hours Hepatic Function Panel  Recent Labs  02/11/13 0525  PROT 6.0  ALBUMIN 2.3*  AST 15  ALT 8  ALKPHOS 78  BILITOT 0.1*    Recent Labs  02/13/13 0605  INR 1.14   BNP (last 3 results)  Recent Labs  02/11/13 1815  PROBNP 2330.0*     Lipid Panel     Component Value Date/Time   CHOL 64 02/09/2013 0622   TRIG 71 02/09/2013 0622      Imaging:  No results found.    Assessment/Plan:   Principal Problem:   Essential hypertension,  benign Active Problems:   Post-radiation rectal bleeding   Ileostomy in place   Colocutaneous fistula   Thrombocytosis   Acute combined systolic and diastolic heart failure   Pulmonary HTN, severe   Bilateral lower extremity edema   DVT, femoral, acute   I/O -2856 since admission. Breathing better. Will titrate carvedilol today to 6.25 mg bid. Continue IV furosemide today, consider switching to oral tomorrow.   Lennette Bihari, MD, [facility] 02/13/2013, 9:59 AM

## 2013-02-13 NOTE — Progress Notes (Addendum)
ANTICOAGULATION CONSULT NOTE - Follow up  Pharmacy Consult for Coumadin/ Lovenox Indication: DVT  Allergies  Allergen Reactions  . Penicillins Other (See Comments)    "anything w/penicillin in it makes me bleed"  . Labetalol Hcl Itching and Other (See Comments)    Bumps to bilateral arms.  . Morphine And Related Hives    Patient Measurements: Height: 5\' 4"  (162.6 cm) Weight: 145 lb 15.1 oz (66.2 kg) IBW/kg (Calculated) : 54.7  Vital Signs: Temp: 97.6 F (36.4 C) (05/24 0500) Temp src: Oral (05/24 0500) BP: 118/86 mmHg (05/24 0500) Pulse Rate: 78 (05/24 0500)  Labs:  Recent Labs  02/11/13 0525 02/12/13 0500 02/12/13 1140 02/13/13 0605  HGB 9.1* 8.5*  --  8.4*  HCT 28.3* 26.8*  --  26.0*  PLT 434* 401*  --  413*  LABPROT  --   --  14.5 14.4  INR  --   --  1.15 1.14  CREATININE 0.70 0.73  --  0.86    Estimated Creatinine Clearance: 83.8 ml/min (by C-G formula based on Cr of 0.86).  Assessment: 37 y/o female found to have an age indeterminate RLE DVT on doppler 5/22- Lovenox was initiaited 5/22, warfarin started 5/23 making this overlap day #2. Baseline INR nml- no anticoagulants PTA. ECF repair/surgery has been indefinitely put off for now. H/H remains low but stable, Plts remain elevated. Noted she is still having hematuria- urology has seen and plans for retrograde pyelograms and cytoscopy.  Goal of Therapy:  INR 2-3 Monitor platelets by anticoagulation protocol: Yes Overlap with Lovenox for a minimum of 5 days (new VTE): today is overlap day #2   Plan:  - continue Enoxaparin 60mg  SQ q12hr - Coumadin 5mg  PO x1 today - Daily PT/INR - follow H/H/Plts closely  Dsean Vantol D. Shatika Grinnell, PharmD Clinical Pharmacist Pager: [phone] 02/13/2013 10:20 AM

## 2013-02-13 NOTE — Progress Notes (Signed)
Continues with hematuria with clots. Colostomy stoma beefy red, but protrudes. Patient reports stoma starting to look like it did prior to surgery. Watery stool, patient empties. 15ml out of JP drain at 2345. Alfredo Martinez A

## 2013-02-14 ENCOUNTER — Encounter (HOSPITAL_COMMUNITY): Payer: Self-pay | Admitting: Cardiology

## 2013-02-14 ENCOUNTER — Inpatient Hospital Stay (HOSPITAL_COMMUNITY): Payer: Medicaid Other

## 2013-02-14 DIAGNOSIS — D62 Acute posthemorrhagic anemia: Secondary | ICD-10-CM

## 2013-02-14 DIAGNOSIS — E876 Hypokalemia: Secondary | ICD-10-CM | POA: Diagnosis not present

## 2013-02-14 DIAGNOSIS — K9409 Other complications of colostomy: Secondary | ICD-10-CM | POA: Diagnosis present

## 2013-02-14 DIAGNOSIS — R112 Nausea with vomiting, unspecified: Secondary | ICD-10-CM

## 2013-02-14 DIAGNOSIS — I824Y9 Acute embolism and thrombosis of unspecified deep veins of unspecified proximal lower extremity: Secondary | ICD-10-CM

## 2013-02-14 DIAGNOSIS — I428 Other cardiomyopathies: Secondary | ICD-10-CM

## 2013-02-14 DIAGNOSIS — IMO0002 Reserved for concepts with insufficient information to code with codable children: Secondary | ICD-10-CM

## 2013-02-14 DIAGNOSIS — I509 Heart failure, unspecified: Secondary | ICD-10-CM

## 2013-02-14 HISTORY — DX: Other cardiomyopathies: I42.8

## 2013-02-14 LAB — BASIC METABOLIC PANEL
BUN: 31 mg/dL — ABNORMAL HIGH (ref 6–23)
CO2: 25 mEq/L (ref 19–32)
Chloride: 103 mEq/L (ref 96–112)
Creatinine, Ser: 1.01 mg/dL (ref 0.50–1.10)
Glucose, Bld: 95 mg/dL (ref 70–99)

## 2013-02-14 LAB — URINALYSIS, ROUTINE W REFLEX MICROSCOPIC
Glucose, UA: NEGATIVE mg/dL
Ketones, ur: 15 mg/dL — AB
Nitrite: NEGATIVE
pH: 6 (ref 5.0–8.0)

## 2013-02-14 LAB — URINE MICROSCOPIC-ADD ON

## 2013-02-14 LAB — PROTIME-INR: Prothrombin Time: 14.5 seconds (ref 11.6–15.2)

## 2013-02-14 LAB — CBC
Hemoglobin: 8.5 g/dL — ABNORMAL LOW (ref 12.0–15.0)
MCH: 28.8 pg (ref 26.0–34.0)
RBC: 2.95 MIL/uL — ABNORMAL LOW (ref 3.87–5.11)

## 2013-02-14 MED ORDER — TECHNETIUM TO 99M ALBUMIN AGGREGATED
6.0000 | Freq: Once | INTRAVENOUS | Status: AC | PRN
  Administered 2013-02-14: 6 via INTRAVENOUS

## 2013-02-14 MED ORDER — TECHNETIUM TC 99M DIETHYLENETRIAME-PENTAACETIC ACID: 40.0000 | Freq: Once | INTRAVENOUS | Status: AC | PRN

## 2013-02-14 MED ORDER — POTASSIUM CHLORIDE 10 MEQ/50ML IV SOLN
10.0000 meq | INTRAVENOUS | Status: AC
  Administered 2013-02-14 (×4): 10 meq via INTRAVENOUS
  Filled 2013-02-14 (×4): qty 50

## 2013-02-14 MED ORDER — CLINIMIX E/DEXTROSE (5/15) 5 % IV SOLN
INTRAVENOUS | Status: DC
  Administered 2013-02-14: 18:00:00 via INTRAVENOUS
  Filled 2013-02-14: qty 2000

## 2013-02-14 NOTE — Progress Notes (Signed)
Pt. Seen and examined. Agree with the NP/PA-C note as written. She is still short of breath - echo showed high pulmonary pressure, out of proportion for left heart failure.  New femoral DVT - was on warfarin, but had frank hematuria overnight. Anticoagulants have been withheld. ?whether she has acute or chronic PE's. Would check V/Q scan - if high probability and continued hematuria, would recommend IVC filter - however, due to low reliability rate, I would try avoid filter if possible given her young age. She will need evaluation for her cardiomyopathy and cMRI is probably best for this (evaluate coronaries, myocardium) - but she needs more diuresis before then.   Chrystie Nose, MD, [facility] Attending Cardiologist [facility]

## 2013-02-14 NOTE — Progress Notes (Signed)
PARENTERAL NUTRITION CONSULT NOTE - Follow up  Pharmacy Consult for TPN Indication: complex GI history with pending fistula repair  Allergies  Allergen Reactions  . Penicillins Other (See Comments)    "anything w/penicillin in it makes me bleed"  . Labetalol Hcl Itching and Other (See Comments)    Bumps to bilateral arms.  . Morphine And Related Hives    Patient Measurements: Height: 5\' 4"  (162.6 cm) Weight: 145 lb 15.1 oz (66.2 kg) IBW/kg (Calculated) : 54.7  Vital Signs: Temp: 98.4 F (36.9 C) (05/25 0752) Temp src: Oral (05/25 0752) BP: 120/80 mmHg (05/25 0752) Pulse Rate: 72 (05/25 0752) Intake/Output from previous day: 05/24 0701 - 05/25 0700 In: 10 [I.V.:10] Out: 2400 [Urine:2400] Intake/Output from this shift: Total I/O In: -  Out: 400 [Urine:400]  Labs:  Recent Labs  02/12/13 0500 02/12/13 1140 02/13/13 0605 02/14/13 0450  WBC 5.3  --  5.8 6.2  HGB 8.5*  --  8.4* 8.5*  HCT 26.8*  --  26.0* 26.4*  PLT 401*  --  413* 425*  INR  --  1.15 1.14 1.15     Recent Labs  02/12/13 0500 02/13/13 0605  NA 140 140  K 4.0 3.6  CL 106 104  CO2 25 26  GLUCOSE 92 89  BUN 14 20  CREATININE 0.73 0.86  CALCIUM 9.9 9.9  MG 2.1 1.8  PHOS  --  4.6   Estimated Creatinine Clearance: 83.8 ml/min (by C-G formula based on Cr of 0.86).   No results found for this basename: GLUCAP,  in the last 72 hours  Medical History: Past Medical History  Diagnosis Date  . History of blood transfusion     "I had ~ 7 in 01/2012"  . Anemia   . Cervical cancer ~ 2011    cervical  . Radiation Nov.18,2011-Jan.23,2012    cervix  . Hypertension     no treatment necessary for BP  since July 2013    Insulin Requirements in the past 24 hours:  None- SSI has been discontinued  Current Nutrition:  Full liquid diet- noted no % intake charted for the past 2 days, but appetite assessed as "good", also receiving Ensure BID BM; Clinimix E 5/15 on 12 hour cycle- 46mL/hr x1 hour, then  188mL/hr over 10 hours, then 28mL/hr x1 hour for a total of over 12 hours. Provides 96g of protein/day and average of 1577kcal/day with fats given 3x week.   Nutritional Goals:  1800-1920 kCal, 80-95 grams of protein per day per RD recommendations 5/23  Assessment: Patient known to pharmacy from past TPN consults. She has a prolapsed ostomy which is s/p repair and is awaiting a fistula repair (scheduled for 5/27). Patient has extensive GI history. Was being worked up for possible obstruction but there does not appear to be one. Through conversation with RD and surgery PA, desire TPN at this point to ensure patient stays nutritionally stable prior to future procedures. Note plans to hold off on surgery indefinitely until nutritional status is improved  GI: s/p prolapsed colectomy revision, appears no obstruction, some BS and abd is soft but bloated, less nauseous. Today patient states stoma looks like it did prior to surgery- pink and edematous. Having BMs per rectum and air in colostomy. PO PPI  Endo: no hx, has not required insulin in her TPN in the past, SSI has been discontinued  Lytes: K 3.4- will need to watch closely as patient now on scheduled IV Lasix (also on lisinopril),  phos increased to 4.8, mag 1.8, corrected Ca 10.9, other lytes nml  Renal: SCr 1.01, CrCl ~33mL/min; has half NS running at 10-40mL/hr; urology recommends cytoscopy and retrograde pyelograms in OR to fully finish evaluation for hematuria  Pulm: RA  Cards: seen by cards for cardiomyopathy (EF 5/10 20%; pro-BNP >2000 5/22)- would like to perform Myoview, but will hold off d/t scheduled surgery next week. For now will be controlling BP and reducing afterload- VSS, on Lasix IV BID, BiDil, lisinopril, and carvediolol  Hepatobil: alkphos, AST, ALT all nml, albumin low at 2.3, TBili low at 0.1; baseline prealbumin 12.1, recheck 11.7 (only 1 day after baseline check); baseline trigs 71  Neuro: no issues  ID: WBC 6.2,  afebrile, no abx  Best Practices: was on Lovenox 60 q12 + warfarin for age indeterminate DVT found on doppler- now discontinued d/t hematuria  TPN Access: PICC placed 5/19 TPN day#: 6 (started 5/19)   Plan:  - will give KCl IV x 4 runs - continue Clinimix E 5/15 cycle at 12 hours- 72mL/hr x1 hour, then 110mL/hr over 10 hours, then 38mL/hr x1 hour for a total of over 12 hours.  - will provide MVI/trace and IV Lipids MWF only due to ongoing shortages - will continue IVF as half NS at 10-36mL/hr - will follow up diet progression   Mikell Kazlauskas D. Ebone Alcivar, PharmD Clinical Pharmacist Pager: [phone] 02/14/2013 8:43 AM

## 2013-02-14 NOTE — Progress Notes (Signed)
11 Days Post-Op  Subjective: Feels better hematuria noted  Objective: Vital signs in last 24 hours: Temp:  [98.1 F (36.7 C)-98.4 F (36.9 C)] 98.4 F (36.9 C) (05/25 0752) Pulse Rate:  [72-79] 72 (05/25 0752) Resp:  [18-20] 18 (05/25 0752) BP: (114-121)/(78-82) 120/80 mmHg (05/25 0752) SpO2:  [99 %-100 %] 99 % (05/25 0752) Last BM Date: 02/13/13  Intake/Output from previous day: 05/24 0701 - 05/25 0700 In: 10 [I.V.:10] Out: 2400 [Urine:2400] Intake/Output this shift: Total I/O In: -  Out: 400 [Urine:400]  Incision/Wound:ostomy pink but prolapsed.  Stool and gas in bag.    Lab Results:   Recent Labs  02/13/13 0605 02/14/13 0450  WBC 5.8 6.2  HGB 8.4* 8.5*  HCT 26.0* 26.4*  PLT 413* 425*   BMET  Recent Labs  02/12/13 0500 02/13/13 0605  NA 140 140  K 4.0 3.6  CL 106 104  CO2 25 26  GLUCOSE 92 89  BUN 14 20  CREATININE 0.73 0.86  CALCIUM 9.9 9.9   PT/INR  Recent Labs  02/13/13 0605 02/14/13 0450  LABPROT 14.4 14.5  INR 1.14 1.15   ABG No results found for this basename: PHART, PCO2, PO2, HCO3,  in the last 72 hours  Studies/Results: No results found.  Anti-infectives: Anti-infectives   Start     Dose/Rate Route Frequency Ordered Stop   02/03/13 1315  ertapenem (INVANZ) 1 g in sodium chloride 0.9 % 50 mL IVPB  Status:  Discontinued     1 g 100 mL/hr over 30 Minutes Intravenous  Once 02/03/13 1312 02/03/13 1650   02/03/13 0800  ertapenem (INVANZ) 1 g in sodium chloride 0.9 % 50 mL IVPB  Status:  Discontinued     1 g 100 mL/hr over 30 Minutes Intravenous Every 24 hours 02/03/13 0759 02/03/13 1702      Assessment/Plan: s/p Procedure(s): COLOSTOMY REVISION (N/A) 1. Prolapsed ostomy, s/p repair with recurrent non-ischemic prolapse  2. enterocolonic fistula, needs repair, scheduled for 02-16-13  3. Hypoalbuminemia  4. Bilateral lower extremity edema, now secondary to # 13, 14  5. Hematuria  6. HTN  7. H/o anemia secondary to ABL from  severe thrombocytopenia from sepsis, still with anemia  8. H/o cervical cancer, s/p hysterectomy and radiation  9. PCM/TNA (prealbumin 12)  10. Hypokalemia  11. Lower extremity pain/edema  12. Bilateral hydronephrosis  13. New diagnosed cardiomyopathy with EF of 20% with global hypokinesis  14. Pulmonary hypertension  15. Right-sided femoral DVT  Plan:  1. I Surgery on hold indefinitely for now. 2. Hold anticoagulation due to hematuria for now.. Greatly appreciate medicine and cardiology's assistance with this patient.  4. diuresis helped 5. Cont diuresis per cardiology as needed 6. Will need to set up [facility] for TNA at home. She may need other services, we'll see  7. No treatment plans for now for hydronephrosis. Will need outpatient cystoscopy.  8. K better today, will cont to follow especially since getting lasix  9. Cont diet and TNA  10. Check labs in the am.   LOS: 11 days    Roux Brandy A. 02/14/2013

## 2013-02-14 NOTE — Progress Notes (Signed)
[facility]   Subjective: No chest pain, still with difficulty lying flat, has to keep head of bed elevated.  Objective: Vital signs in last 24 hours: Temp:  [98.1 F (36.7 C)-98.4 F (36.9 C)] 98.4 F (36.9 C) (05/25 0752) Pulse Rate:  [72-79] 72 (05/25 0752) Resp:  [18-20] 18 (05/25 0752) BP: (114-121)/(78-82) 120/80 mmHg (05/25 0959) SpO2:  [99 %-100 %] 99 % (05/25 0752) Weight change:  Last BM Date: 02/13/13 Intake/Output from previous day: -2390 (total since admit -5646)   05/24 0701 - 05/25 0700 In: 10 [I.V.:10] Out: 2400 [Urine:2400] Intake/Output this shift: Total I/O In: -  Out: 400 [Urine:400]  PE: General:Pleasant affect, NAD HEENT:normocephalic, sclera clear, mucus membranes moist Neck:supple, no JVD, no bruits  Heart:S1S2 RRR without murmur, gallup, rub or click Lungs:clear though diminished in the bases without rales, rhonchi, or wheezes OZH:YQMV, non tender, + BS, do not palpate liver spleen or masses Ext: + 1 lower ext edema though mostly in feet at this time 2+ pedal pulses Neuro:alert and oriented, MAE, follows commands, + facial symmetry   TELE: SR, short burst of PACs it appears in lead II  Lab Results:  Recent Labs  02/13/13 0605 02/14/13 0450  WBC 5.8 6.2  HGB 8.4* 8.5*  HCT 26.0* 26.4*  PLT 413* 425*   BMET  Recent Labs  02/13/13 0605 02/14/13 0913  NA 140 139  K 3.6 3.4*  CL 104 103  CO2 26 25  GLUCOSE 89 95  BUN 20 31*  CREATININE 0.86 1.01  CALCIUM 9.9 9.8   No results found for this basename: TROPONINI, CK, MB,  in the last 72 hours  Lab Results  Component Value Date   CHOL 64 02/09/2013   TRIG 71 02/09/2013   No results found for this basename: HGBA1C     No results found for this basename: TSH      Studies/Results: No results found.  Medications: I have reviewed the patient's current medications. Scheduled Meds: . carvedilol  6.25 mg Oral BID WC  . feeding supplement  237 mL  Oral BID BM  . furosemide  40 mg Intravenous BID  . isosorbide-hydrALAZINE  1 tablet Oral BID  . lisinopril  10 mg Oral Daily  . pantoprazole  40 mg Oral QHS  . potassium chloride  10 mEq Intravenous Q1 Hr x 4  . traMADol  50 mg Oral Q6H   Continuous Infusions: . Marland KitchenTPN (CLINIMIX-E) Adult    . sodium chloride 20 mL/hr (02/09/13 1909)   PRN Meds:.hydrALAZINE, HYDROmorphone (DILAUDID) injection, ondansetron, oxyCODONE, promethazine, sodium chloride  Assessment/Plan: Principal Problem:   Colostomy prolapse, with revision  Active Problems:   Acute combined systolic and diastolic heart failure   DVT, femoral, acute   Cardiomyopathy   Bilateral lower extremity edema   Post-radiation rectal bleeding   Entero-colonic fistula   Essential hypertension, benign   Thrombocytosis   Pulmonary HTN, severe   Anemia, secondary to blood loss   Hypokalemia  PLAN:was on coumadin for DVT but coumadin stopped for hematuria.   Need surger for enterocolonic fistula repair now on hold.  Anemia secondary to ABL anemia from severe thrombocytopenia from sepsis Hypokalemia ,replacing   LOS: 11 days     Time spent with pt. :25 minutes. [facility] R  Nurse Practitioner Certified Pager [phone] 02/14/2013, 12:27 PM

## 2013-02-14 NOTE — Progress Notes (Signed)
Patient ID: Vanessa [name]  female  WUJ:[id number]    DOB: 20-Dec-1975    DOA: 02/03/2013  PCP: Dorrene German, MD  Assessment/Plan: Hypertension: BP stable  Cardiomyopathy with EF of 20%, global hypokinesis and Peripheral edema:  - Appreciate cardiology for evaluating her, will continue Lasix for negative balance as she is receiving TPN as well - Hopefully, if possible inpatient stress test prior to DC - On Coreg and ACEI, BIDIL, lasix. Cr function stable  Acute DVT right lower extremity but having hematuria today, started last night per patient off and on small clots, at times she has clear urine. - Hold Lovenox and Coumadin.  - If hematuria did not improve by tomorrow, may need to put an IVC filter, will discuss with surgery service  Hematuria - d/w Dr Sherron Monday (on call urology), did not recommend any urological intervention for hematuria. She has a cystoscopy planned by Dr. Mena Goes outpatient. No need of foley or bladder irrigation unless any obstruction or bigger clots. - Ordered a UA and culture to rule out UTI   Mild bilateral hydronephrosis: Renal function is normal  - Urology following  Thrombocytosis: Likely reactive this appears be stable.   prolapsed ostomy, s/p repair with recurrent non-ischemic prolapse  - Per CCS  DVT Prophylaxis:  Code Status:  Disposition: per primary service. I will continue to follow her closely.      Subjective: Hematuria started last night. Frank dark-colored hematuria with no significant clots in the toilet. No flank pain  Objective: Weight change:   Intake/Output Summary (Last 24 hours) at 02/14/13 0757 Last data filed at 02/14/13 0500  Gross per 24 hour  Intake     10 ml  Output   2400 ml  Net  -2390 ml   Blood pressure 120/80, pulse 72, temperature 98.4 F (36.9 C), temperature source Oral, resp. rate 18, height 5\' 4"  (1.626 m), weight 66.2 kg (145 lb 15.1 oz), SpO2 99.00%.  Physical Exam: General: Alert and awake,  oriented x3, NADs. CVS: S1-S2 clear, no murmur rubs or gallops Chest: CTAB Abdomen: soft mild distended and tender around her ostomy Extremities: no cyanosis, clubbing. Trace- 1+  edema bilaterally, L >R    Lab Results: Basic Metabolic Panel:  Recent Labs Lab 02/12/13 0500 02/13/13 0605  NA 140 140  K 4.0 3.6  CL 106 104  CO2 25 26  GLUCOSE 92 89  BUN 14 20  CREATININE 0.73 0.86  CALCIUM 9.9 9.9  MG 2.1 1.8  PHOS  --  4.6   Liver Function Tests:  Recent Labs Lab 02/09/13 0622 02/11/13 0525  AST 15 15  ALT 10 8  ALKPHOS 90 78  BILITOT 0.2* 0.1*  PROT 6.1 6.0  ALBUMIN 2.5* 2.3*   No results found for this basename: LIPASE, AMYLASE,  in the last 168 hours No results found for this basename: AMMONIA,  in the last 168 hours CBC:  Recent Labs Lab 02/09/13 0622  02/13/13 0605 02/14/13 0450  WBC 7.7  < > 5.8 6.2  NEUTROABS 5.2  --   --   --   HGB 9.2*  < > 8.4* 8.5*  HCT 28.4*  < > 26.0* 26.4*  MCV 89.3  < > 90.0 89.5  PLT 500*  < > 413* 425*  < > = values in this interval not displayed. Cardiac Enzymes: No results found for this basename: CKTOTAL, CKMB, CKMBINDEX, TROPONINI,  in the last 168 hours BNP: No components found with this basename: POCBNP,  CBG:  Recent Labs Lab 02/09/13 2327 02/10/13 0334 02/10/13 0737 02/10/13 1154 02/10/13 1814  GLUCAP 117* 119* 102* 117* 98     Micro Results: Recent Results (from the past 240 hour(s))  URINE CULTURE     Status: None   Collection Time    02/07/13  4:59 PM      Result Value Range Status   Specimen Description URINE, CLEAN CATCH   Final   Special Requests NONE   Final   Culture  Setup Time 02/07/2013 22:10   Final   Colony Count >=100,000 COLONIES/ML   Final   Culture     Final   Value: Multiple bacterial morphotypes present, none predominant. Suggest appropriate recollection if clinically indicated.   Report Status 02/08/2013 FINAL   Final    Studies/Results: Ct Abdomen Pelvis W  Contrast  01/28/2013   *RADIOLOGY REPORT*  Clinical Data: Left lower quadrant abdominal pain with lower extremity and abdominal edema.  Left lower quadrant drain placed 7 months ago.  History of cervical cancer, radiation and chemotherapy, sigmoid colectomy with ostomy.  CT ABDOMEN AND PELVIS WITH CONTRAST  Technique:  Multidetector CT imaging of the abdomen and pelvis was performed following the standard protocol during bolus administration of intravenous contrast.  Contrast: 80mL OMNIPAQUE IOHEXOL 300 MG/ML  SOLN  Comparison: Abdominal pelvic CT 12/03/2012.  Barium enema 01/18/2013.  Findings: Images through the lung bases demonstrate stable cardiomegaly and linear scarring or atelectasis at both lung bases. There are trace bilateral pleural effusions.  There is mild ascites with diffuse soft tissue edema consistent with anasarca.  The abdomen is imaged prior to portal vein opacification.  There is some retrograde filling of the IVC and hepatic veins.  The hepatic steatosis has improved compared with the most recent study.  The previously noted low density lesions in the right and left hepatic lobes are grossly stable. The gallbladder is surgically absent. There is a stable nonobstructing calculus in the lower pole of the left kidney.  There is no hydronephrosis.  The right kidney, spleen, pancreas and adrenal glands appear normal.  Descending colostomy and left lower quadrant percutaneous drain appear unchanged.  Evaluation of the pelvic anatomy is limited by the anasarca and arterial phase imaging.  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 There is likely a small amount of residual extraluminal air and fluid in the left lower quadrant inferior and anterior to the surgical drain.  This appears grossly unchanged.  There is diffuse  bladder wall thickening.  There is diffuse thickening of the wall of the rectosigmoid colon and perirectal soft tissue stranding. There is still some air within the vagina, but no contrast.  There are no worrisome osseous findings.  IMPRESSION:  1.  Progressive anasarca with diffuse soft tissue edema, ascites and trace pleural effusions. 2.  Cardiomegaly with reflux of contrasted blood into the IVC and hepatic veins suggesting a degree of right heart failure. 3.  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  No large drainable collection identified.   Original Report Authenticated By: Carey Bullocks, M.D.   Dg Abd 2 Views  02/08/2013   *RADIOLOGY REPORT*  Clinical Data: Prolapsing colostomy and abdominal distention.  ABDOMEN - 2 VIEW  Comparison: 02/01/2013  Findings: There  is a left lower quadrant colostomy.  Left lower quadrant pigtail drainage catheter is identified in the projection of the left iliac bone.  There is moderate gaseous distention of the transverse colon.  A few prominent small bowel loops are noted which measure up to 2.8 cm.  IMPRESSION:  1.  Moderate gaseous distention of the transverse colon. 2.  Pigtail drainage catheter projects over the left iliac bone and is presumably within the left iliac fossa as before.   Original Report Authenticated By: Signa Kell, M.D.   Dg Abd Acute W/chest  02/01/2013   *RADIOLOGY REPORT*  Clinical Data: Abdominal pain  ACUTE ABDOMEN SERIES (ABDOMEN 2 VIEW & CHEST 1 VIEW)  Comparison: 01/28/2013 CT  Findings: Cardiomegaly.  Central vascular congestion. No focal consolidation.  Surgical clips right upper quadrant.  No free intraperitoneal air. Surgical drain projects over the left lower quadrant.  There is air within normal caliber colon and a small amount of contrast within the left lower colon projecting just above the  colostomy. There is also high attenuation over the colostomy, most in keeping with ingested contrast from the recent comparison. 2 small nonspecific metallic densities project over the mid pelvis. Gentle leftward curvature of the spine.  No acute osseous finding.  IMPRESSION: Surgical drain projects over the left lower abdomen/upper pelvis.  Nonspecific bowel gas pattern without overt obstruction.   Original Report Authenticated By: Jearld Lesch, M.D.   Dg Colon W/cm - Wo/w Kub  01/18/2013   *RADIOLOGY REPORT*  Clinical Data: No complex surgical history.  The patient has a cervical cancer radiation therapy the pelvis.  Subsequent a diverting colostomy and Hartmann's pouch.  Persistent drainage of stool through the Hartmann's pouch.  The patient additionally has a percutaneous drain in the pelvis.  The  SINGLE COLUMN BARIUM ENEMA  Technique:  Initial scout AP supine abdominal image obtained to insure adequate colon cleansing.  Barium was introduced into the colon in a retrograde fashion and refluxed from the rectum to the cecum. Spot images of the colon followed by overhead radiographs were obtained.  Fluoroscopy time: 2 minutes 29 seconds  Comparison:  CT 12/03/2012, and fistulogram, 08/25/2012, small bowel follow through the 09/20/2012  Findings:  A Foley catheter was inserted per rectum and 5 cc  gas insufflated into the retention balloon.  Under fluoroscopic observation are soluble contrast was administered via gravity feed per rectum.  Contrast filled the Hartmann's pouch.  Contrast was quickly seen within the cecum.  Contrast was also quickly seen within the distal small bowel.  Contrast flowed antegrade into the ascending and transverse colon to the level of the splenic flexure. The is high density contrast within the terminal ileum.  No significant contrast flowed into the percutaneous drain.  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  In  examining the combination of studies, favor the fistula  to occur between the sigmoid colon pouch and the distal small bowel.  Findings discussed with Dr. [name] on 01/18/2013.   Original Report Authenticated By: Genevive Bi, M.D.    Medications: Scheduled Meds: . carvedilol  6.25 mg Oral BID WC  . enoxaparin (LOVENOX) injection  60 mg Subcutaneous Q12H  . feeding supplement  237 mL Oral BID BM  . furosemide  40 mg Intravenous BID  . isosorbide-hydrALAZINE  1 tablet Oral BID  . lisinopril  10 mg Oral Daily  . pantoprazole  40 mg Oral QHS  . traMADol  50 mg Oral Q6H  . Warfarin - Pharmacist Dosing Inpatient   Does not apply q1800      LOS: 11 days   Tallen Schnorr M.D. [facility] 02/14/2013, 7:57 AM Pager: [phone]  If 7PM-7AM, please contact night-coverage [email or web address] Password [name]

## 2013-02-15 DIAGNOSIS — I428 Other cardiomyopathies: Secondary | ICD-10-CM

## 2013-02-15 DIAGNOSIS — R310 Gross hematuria: Secondary | ICD-10-CM | POA: Diagnosis not present

## 2013-02-15 DIAGNOSIS — Z933 Colostomy status: Secondary | ICD-10-CM

## 2013-02-15 LAB — CBC
HCT: 25.5 % — ABNORMAL LOW (ref 36.0–46.0)
Hemoglobin: 8.3 g/dL — ABNORMAL LOW (ref 12.0–15.0)
MCHC: 32.5 g/dL (ref 30.0–36.0)
RBC: 2.84 MIL/uL — ABNORMAL LOW (ref 3.87–5.11)
WBC: 6.3 10*3/uL (ref 4.0–10.5)

## 2013-02-15 LAB — COMPREHENSIVE METABOLIC PANEL
ALT: 28 U/L (ref 0–35)
Albumin: 2.4 g/dL — ABNORMAL LOW (ref 3.5–5.2)
Alkaline Phosphatase: 70 U/L (ref 39–117)
Potassium: 3.6 mEq/L (ref 3.5–5.1)
Sodium: 141 mEq/L (ref 135–145)
Total Protein: 6.3 g/dL (ref 6.0–8.3)

## 2013-02-15 LAB — URINE CULTURE

## 2013-02-15 LAB — DIFFERENTIAL
Basophils Absolute: 0.1 10*3/uL (ref 0.0–0.1)
Basophils Relative: 1 % (ref 0–1)
Lymphocytes Relative: 26 % (ref 12–46)
Monocytes Absolute: 0.7 10*3/uL (ref 0.1–1.0)
Monocytes Relative: 11 % (ref 3–12)
Neutro Abs: 3.7 10*3/uL (ref 1.7–7.7)
Neutrophils Relative %: 59 % (ref 43–77)

## 2013-02-15 LAB — TRIGLYCERIDES: Triglycerides: 56 mg/dL (ref <150)

## 2013-02-15 LAB — MAGNESIUM: Magnesium: 1.9 mg/dL (ref 1.5–2.5)

## 2013-02-15 LAB — PHOSPHORUS: Phosphorus: 5 mg/dL — ABNORMAL HIGH (ref 2.3–4.6)

## 2013-02-15 MED ORDER — FAT EMULSION 20 % IV EMUL
250.0000 mL | INTRAVENOUS | Status: AC
  Administered 2013-02-15: 250 mL via INTRAVENOUS
  Filled 2013-02-15 (×2): qty 250

## 2013-02-15 MED ORDER — TRACE MINERALS CR-CU-F-FE-I-MN-MO-SE-ZN IV SOLN
INTRAVENOUS | Status: AC
  Administered 2013-02-15: 18:00:00 via INTRAVENOUS
  Filled 2013-02-15: qty 2000

## 2013-02-15 MED ORDER — DEXTROSE 5 % IV SOLN
1.0000 g | INTRAVENOUS | Status: DC
  Administered 2013-02-15 – 2013-02-18 (×4): 1 g via INTRAVENOUS
  Filled 2013-02-15 (×5): qty 10

## 2013-02-15 MED ORDER — CLINIMIX E/DEXTROSE (5/15) 5 % IV SOLN
INTRAVENOUS | Status: DC
  Filled 2013-02-15: qty 1000

## 2013-02-15 MED ORDER — FUROSEMIDE 40 MG PO TABS
40.0000 mg | ORAL_TABLET | Freq: Two times a day (BID) | ORAL | Status: DC
  Administered 2013-02-15 – 2013-02-18 (×6): 40 mg via ORAL
  Filled 2013-02-15 (×9): qty 1

## 2013-02-15 NOTE — Progress Notes (Signed)
DAILY PROGRESS NOTE  Subjective:  No events overnight. Still with hematuria and small clots, but less bleeding. Off anticoagulants. 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 Breathing is better, now can lie flat. V/Q scan yesterday was negative for PE.  Objective:  Temp:  [97.8 F (36.6 C)-98.4 F (36.9 C)] 97.8 F (36.6 C) (05/26 0647) Pulse Rate:  [81-82] 81 (05/26 0647) Resp:  [16-18] 16 (05/26 0647) BP: (117-127)/(75-81) 117/75 mmHg (05/26 0647) SpO2:  [100 %] 100 % (05/26 0647) Weight change:   Intake/Output from previous day: 05/25 0701 - 05/26 0700 In: 1868.2 [I.V.:206.1; TPN:1662.1] Out: 1570 [Urine:1550; Drains:20]  Intake/Output from this shift: Total I/O In: -  Out: 550 [Urine:550]  Medications: Current Facility-Administered Medications  Medication Dose Route Frequency Provider Last Rate Last Dose  . 0.45 % sodium chloride infusion   Intravenous Continuous Lauren Bajbus, RPH 20 mL/hr at 02/14/13 2359 20 mL/hr at 02/14/13 2359  . carvedilol (COREG) tablet 6.25 mg  6.25 mg Oral BID WC Nada Boozer, NP   6.25 mg at 02/15/13 0757  . feeding supplement (ENSURE COMPLETE) liquid 237 mL  237 mL Oral BID BM Cherylynn Ridges, MD   237 mL at 02/12/13 1435  . furosemide (LASIX) injection 40 mg  40 mg Intravenous BID Wilburt Finlay, PA-C   40 mg at 02/15/13 0759  . hydrALAZINE (APRESOLINE) injection 5 mg  5 mg Intravenous Q4H PRN Jerald Kief, MD   5 mg at 02/10/13 2119  . HYDROmorphone (DILAUDID) injection 0.5-2 mg  0.5-2 mg Intravenous Q4H PRN Cherylynn Ridges, MD   2 mg at 02/15/13 0617  . isosorbide-hydrALAZINE (BIDIL) 20-37.5 MG per tablet 1 tablet  1 tablet Oral BID Wilburt Finlay, PA-C   1 tablet at 02/14/13 2131  . lisinopril (PRINIVIL,ZESTRIL) tablet 10 mg  10 mg Oral Daily Jerald Kief, MD   10 mg at 02/14/13 0959  . ondansetron (ZOFRAN) injection 4 mg  4 mg Intravenous Q6H PRN Cherylynn Ridges, MD   4 mg at 02/14/13 1950  . oxyCODONE (Oxy IR/ROXICODONE) immediate release  tablet 5-15 mg  5-15 mg Oral Q4H PRN Liz Malady, MD   15 mg at 02/15/13 0757  . pantoprazole (PROTONIX) EC tablet 40 mg  40 mg Oral QHS Herby Abraham, RPH   40 mg at 02/14/13 2131  . promethazine (PHENERGAN) injection 6.25-12.5 mg  6.25-12.5 mg Intravenous Q6H PRN Letha Cape, PA-C   12.5 mg at 02/15/13 4098  . sodium chloride 0.9 % injection 10-40 mL  10-40 mL Intracatheter PRN Letha Cape, PA-C   10 mL at 02/15/13 1191  . traMADol (ULTRAM) tablet 50 mg  50 mg Oral Q6H Liz Malady, MD   50 mg at 02/14/13 1754    Physical Exam: General appearance: alert and no distress Neck: JVD - 2 cm above sternal notch, no adenopathy, no carotid bruit, supple, symmetrical, trachea midline and thyroid not enlarged, symmetric, no tenderness/mass/nodules Lungs: clear to auscultation bilaterally Heart: regular rate and rhythm, S1, S2 normal, no murmur, click, rub or gallop Abdomen: soft, non-tender; bowel sounds normal; no masses,  no organomegaly Extremities: extremities normal, atraumatic, no cyanosis or edema Pulses: 2+ and symmetric Skin: Skin color, texture, turgor normal. No rashes or lesions Neurologic: Grossly normal  Lab Results: Results for orders placed during the hospital encounter of 02/03/13 (from the past 48 hour(s))  CBC     Status: Abnormal   Collection Time    02/14/13  4:50 AM  Result Value Range   WBC 6.2  4.0 - 10.5 K/uL   RBC 2.95 (*) 3.87 - 5.11 MIL/uL   Hemoglobin 8.5 (*) 12.0 - 15.0 g/dL   HCT 16.1 (*) 09.6 - 04.5 %   MCV 89.5  78.0 - 100.0 fL   MCH 28.8  26.0 - 34.0 pg   MCHC 32.2  30.0 - 36.0 g/dL   RDW 40.9 (*) 81.1 - 91.4 %   Platelets 425 (*) 150 - 400 K/uL  PROTIME-INR     Status: None   Collection Time    02/14/13  4:50 AM      Result Value Range   Prothrombin Time 14.5  11.6 - 15.2 seconds   INR 1.15  0.00 - 1.49  URINALYSIS, ROUTINE W REFLEX MICROSCOPIC     Status: Abnormal   Collection Time    02/14/13  8:15 AM      Result Value  Range   Color, Urine RED (*) YELLOW   Comment: BIOCHEMICALS MAY BE AFFECTED BY COLOR   APPearance CLOUDY (*) CLEAR   Specific Gravity, Urine 1.012  1.005 - 1.030   pH 6.0  5.0 - 8.0   Glucose, UA NEGATIVE  NEGATIVE mg/dL   Hgb urine dipstick LARGE (*) NEGATIVE   Bilirubin Urine LARGE (*) NEGATIVE   Ketones, ur 15 (*) NEGATIVE mg/dL   Protein, ur 782 (*) NEGATIVE mg/dL   Urobilinogen, UA 0.2  0.0 - 1.0 mg/dL   Nitrite NEGATIVE  NEGATIVE   Leukocytes, UA MODERATE (*) NEGATIVE  URINE MICROSCOPIC-ADD ON     Status: None   Collection Time    02/14/13  8:15 AM      Result Value Range   Squamous Epithelial / LPF RARE  RARE   WBC, UA 3-6  <3 WBC/hpf   RBC / HPF TOO NUMEROUS TO COUNT  <3 RBC/hpf   Bacteria, UA RARE  RARE  BASIC METABOLIC PANEL     Status: Abnormal   Collection Time    02/14/13  9:13 AM      Result Value Range   Sodium 139  135 - 145 mEq/L   Potassium 3.4 (*) 3.5 - 5.1 mEq/L   Chloride 103  96 - 112 mEq/L   CO2 25  19 - 32 mEq/L   Glucose, Bld 95  70 - 99 mg/dL   BUN 31 (*) 6 - 23 mg/dL   Creatinine, Ser 9.56  0.50 - 1.10 mg/dL   Calcium 9.8  8.4 - 21.3 mg/dL   GFR calc non Af Amer 70 (*) >90 mL/min   GFR calc Af Amer 81 (*) >90 mL/min   Comment:            The eGFR has been calculated     using the CKD EPI equation.     This calculation has not been     validated in all clinical     situations.     eGFR's persistently     <90 mL/min signify     possible Chronic Kidney Disease.  PHOSPHORUS     Status: Abnormal   Collection Time    02/14/13  9:13 AM      Result Value Range   Phosphorus 4.8 (*) 2.3 - 4.6 mg/dL  MAGNESIUM     Status: None   Collection Time    02/14/13  9:13 AM      Result Value Range   Magnesium 1.8  1.5 - 2.5 mg/dL  COMPREHENSIVE METABOLIC PANEL  Status: Abnormal   Collection Time    02/15/13  5:15 AM      Result Value Range   Sodium 141  135 - 145 mEq/L   Potassium 3.6  3.5 - 5.1 mEq/L   Chloride 104  96 - 112 mEq/L   CO2 26  19  - 32 mEq/L   Glucose, Bld 79  70 - 99 mg/dL   BUN 35 (*) 6 - 23 mg/dL   Creatinine, Ser 1.61 (*) 0.50 - 1.10 mg/dL   Calcium 09.6  8.4 - 04.5 mg/dL   Total Protein 6.3  6.0 - 8.3 g/dL   Albumin 2.4 (*) 3.5 - 5.2 g/dL   AST 37  0 - 37 U/L   ALT 28  0 - 35 U/L   Alkaline Phosphatase 70  39 - 117 U/L   Total Bilirubin 0.1 (*) 0.3 - 1.2 mg/dL   GFR calc non Af Amer 61 (*) >90 mL/min   GFR calc Af Amer 71 (*) >90 mL/min   Comment:            The eGFR has been calculated     using the CKD EPI equation.     This calculation has not been     validated in all clinical     situations.     eGFR's persistently     <90 mL/min signify     possible Chronic Kidney Disease.  MAGNESIUM     Status: None   Collection Time    02/15/13  5:15 AM      Result Value Range   Magnesium 1.9  1.5 - 2.5 mg/dL  PHOSPHORUS     Status: Abnormal   Collection Time    02/15/13  5:15 AM      Result Value Range   Phosphorus 5.0 (*) 2.3 - 4.6 mg/dL  CBC     Status: Abnormal   Collection Time    02/15/13  5:15 AM      Result Value Range   WBC 6.3  4.0 - 10.5 K/uL   RBC 2.84 (*) 3.87 - 5.11 MIL/uL   Hemoglobin 8.3 (*) 12.0 - 15.0 g/dL   HCT 40.9 (*) 81.1 - 91.4 %   MCV 89.8  78.0 - 100.0 fL   MCH 29.2  26.0 - 34.0 pg   MCHC 32.5  30.0 - 36.0 g/dL   RDW 78.2 (*) 95.6 - 21.3 %   Platelets 398  150 - 400 K/uL  DIFFERENTIAL     Status: None   Collection Time    02/15/13  5:15 AM      Result Value Range   Neutrophils Relative % 59  43 - 77 %   Neutro Abs 3.7  1.7 - 7.7 K/uL   Lymphocytes Relative 26  12 - 46 %   Lymphs Abs 1.6  0.7 - 4.0 K/uL   Monocytes Relative 11  3 - 12 %   Monocytes Absolute 0.7  0.1 - 1.0 K/uL   Eosinophils Relative 3  0 - 5 %   Eosinophils Absolute 0.2  0.0 - 0.7 K/uL   Basophils Relative 1  0 - 1 %   Basophils Absolute 0.1  0.0 - 0.1 K/uL  TRIGLYCERIDES     Status: None   Collection Time    02/15/13  5:15 AM      Result Value Range   Triglycerides 56  <150 mg/dL     Imaging: Dg Chest 2 View  02/14/2013   *  RADIOLOGY REPORT*  Clinical Data: Shortness of breath.  Slight cough.  Slight nausea. Smoker.  CHEST - 2 VIEW  Comparison: 02/01/2013.  Findings: Stable enlarged cardiac silhouette.  No definite pleural fluid.  Normal vasculature.  Right PICC tip in the inferior aspect of the superior vena cava.  Unremarkable bones.  IMPRESSION: No acute abnormality.  Stable cardiomegaly.   Original Report Authenticated By: Beckie Salts, M.D.   Nm Pulmonary Perf And Vent  02/14/2013   *RADIOLOGY REPORT*  Clinical Data: Shortness of breath.  NM PULMONARY VENTILATION AND PERFUSION SCAN  Radiopharmaceutical: CURIE MAA TECHNETIUM TO 52M ALBUMIN AGGREGATED and 40 mCi technetium 42m DTPA.  Comparison: Chest radiographs obtained earlier today.  Findings: Normal ventilation and perfusion of both lungs.  No perfusion defects are seen.  IMPRESSION: Normal examination.  No evidence of pulmonary embolism.   Original Report Authenticated By: Beckie Salts, M.D.    Assessment:  1. Principal Problem: 2.   Colostomy prolapse, with revision  3. Active Problems: 4.   Post-radiation rectal bleeding 5.   Entero-colonic fistula 6.   Essential hypertension, benign 7.   Thrombocytosis 8.   Acute combined systolic and diastolic heart failure 9.   Pulmonary HTN, severe 10.   Bilateral lower extremity edema 11.   DVT, femoral, acute 12.   Cardiomyopathy 13.   Anemia, secondary to blood loss 14.   Hypokalemia 15.   Plan:  1. Creatinine is drifting up today, I think we have reached a diuretic endpoint. She was closely matched in/out yesterday, due to TPN.  Question if her low albumin is more due to CHF - I think po supplementation would be preferable to TPN in the setting of CHF. Change lasix to 40 mg po BID today. V/Q yesterday was normal.  Lungs are clear. I reviewed venous ultrasounds and think DVT appears chronic and may be partially recanalized - these have a lower risk of  embolization. I would recommend continuing to hold on anticoagulation due to hematuria (which has reduced) and consider starting warfarin (without bridging) a few days after it has resolved. I do not feel strongly about IVC filter at this time. She is now able to lay flat, I would recommend repeat BNP tomorrow and probably cMRI now while she is inpatient to look for reversible causes of her cardiomyopathy and r/o coronary disease. She may be a candidate for LifeVest therapy as a bridge to AICD in the near future.  I have discussed my recommendations with Dr. Luisa Hart today.  We will continue to follow with you.  Time Spent Directly with Patient:  15 minutes  Length of Stay:  LOS: 12 days   Chrystie Nose, MD, [facility] Attending Cardiologist [facility]  HILTY,Kenneth C 02/15/2013, 9:25 AM

## 2013-02-15 NOTE — Progress Notes (Signed)
PARENTERAL NUTRITION CONSULT NOTE - Follow up  Pharmacy Consult for TPN Indication: complex GI history with pending fistula repair  Allergies  Allergen Reactions  . Penicillins Other (See Comments)    "anything w/penicillin in it makes me bleed"  . Labetalol Hcl Itching and Other (See Comments)    Bumps to bilateral arms.  . Morphine And Related Hives    Patient Measurements: Height: 5\' 4"  (162.6 cm) Weight: 145 lb 15.1 oz (66.2 kg) IBW/kg (Calculated) : 54.7  Vital Signs: Temp: 97.8 F (36.6 C) (05/26 0647) Temp src: Oral (05/26 0647) BP: 117/75 mmHg (05/26 0647) Pulse Rate: 81 (05/26 0647) Intake/Output from previous day: 05/25 0701 - 05/26 0700 In: 1868.2 [I.V.:206.1; TPN:1662.1] Out: 1570 [Urine:1550; Drains:20] Intake/Output from this shift: Total I/O In: -  Out: 550 [Urine:550]  Labs:  Recent Labs  02/12/13 1140 02/13/13 0605 02/14/13 0450 02/15/13 0515  WBC  --  5.8 6.2 6.3  HGB  --  8.4* 8.5* 8.3*  HCT  --  26.0* 26.4* 25.5*  PLT  --  413* 425* 398  INR 1.15 1.14 1.15  --      Recent Labs  02/13/13 0605 02/14/13 0913 02/15/13 0515  NA 140 139 141  K 3.6 3.4* 3.6  CL 104 103 104  CO2 26 25 26   GLUCOSE 89 95 79  BUN 20 31* 35*  CREATININE 0.86 1.01 1.13*  CALCIUM 9.9 9.8 10.0  MG 1.8 1.8 1.9  PHOS 4.6 4.8* 5.0*  PROT  --   --  6.3  ALBUMIN  --   --  2.4*  AST  --   --  37  ALT  --   --  28  ALKPHOS  --   --  70  BILITOT  --   --  0.1*  TRIG  --   --  56   Estimated Creatinine Clearance: 63.8 ml/min (by C-G formula based on Cr of 1.13).   No results found for this basename: GLUCAP,  in the last 72 hours  Medical History: Past Medical History  Diagnosis Date  . History of blood transfusion     "I had ~ 7 in 01/2012"  . Anemia   . Cervical cancer ~ 2011    cervical  . Radiation Nov.18,2011-Jan.23,2012    cervix  . Hypertension     no treatment necessary for BP  since July 2013  . Cardiomyopathy 02/14/2013    Insulin  Requirements in the past 24 hours:  None- SSI has been discontinued  Current Nutrition:  Full liquid diet- noted no % intake charted yesterday, but appetite assessed as "good", also receiving Ensure BID BM; Clinimix E 5/15:  33mL/hr x1 hour, then 140mL/hr over 10 hours, then 2mL/hr x1 hour for a total of over 12 hours.  Provides 95g of protein/day and average of 1560kcal/day with fats given 3x week.   Assessment: Patient known to pharmacy from past TPN consults. She has a prolapsed ostomy which is s/p repair and is awaiting a fistula repair (scheduled for 5/27). Patient has extensive GI history. Was being worked up for possible obstruction but there does not appear to be one. Through conversation with RD and surgery PA, desire TPN at this point to ensure patient stays nutritionally stable prior to future procedures. Note plans to hold off on surgery indefinitely until nutritional status is improved  Nutritional Goals:  1800-1920 kCal, 80-95 grams of protein per day per RD recommendations 5/23  GI: s/p prolapsed colectomy revision, appears  no obstruction, some BS and abd is soft but bloated, less nauseous. Today patient states stoma looks like it did prior to surgery- red and protruding. Watery stool. PO PPI  Endo: no hx, has not required insulin in her TPN in the past, SSI has been discontinued  Lytes: K stable at 3.6 -- will need to watch closely as patient now on scheduled IV Lasix (also on lisinopril), phos increased to 5, mag 1.9, corrected Ca 11.3, other lytes nml.  Renal: SCr inc 1.13, CrCl ~59mL/min; has half NS running at 10-34mL/hr; urology recommends cytoscopy and retrograde pyelograms in OR to fully finish evaluation for hematuria  Pulm: RA  Cards: seen by cards for cardiomyopathy (EF 5/10 20%; pro-BNP >2000 5/22)- would like to perform Myoview, but will hold off d/t scheduled surgery next week. For now will be controlling BP and reducing afterload- VSS, on Lasix IV BID,  BiDil, lisinopril, and Lopressor  Hepatobil: alkphos, AST, ALT all nml, albumin low at 2.3, TBili low at 0.1; baseline prealbumin 12.1, recheck 11.7 (only 1 day after baseline check); baseline trigs 71>56.  Neuro: no issues  ID: WBC 6.3, afebrile, no abx  Best Practices: Lovenox 60 q12- age indeterminate DVT found on doppler 5/22, also on warfarin  TPN Access: PICC placed 5/19 TPN day#: started 5/19  Plan:  - Change formula to remove lytes (due to elevated phos) - NO LYTES, continue to cycle over 12h: Clinimix 5/15 - 72mL/hr x1 hour, then 130mL/hr over 10 hours, then 66mL/hr x1 hour for a total of over 12 hours - MVI/trace and IV Lipids MWF only due to ongoing shortages - will continue IVF as half NS at 10-46mL/hr - will follow up diet progression - Routine TNA labs, am BMP, mag, phos - may need lyte repletion after changing to no lytes formula today  Jill Side L. Illene Bolus, PharmD, BCPS Clinical Pharmacist Pager: [phone] Pharmacy: [phone] 02/15/2013 9:51 AM

## 2013-02-15 NOTE — Progress Notes (Signed)
12 Days Post-Op  Subjective: Still SOB..VQ negative.   Objective: Vital signs in last 24 hours: Temp:  [97.8 F (36.6 C)-98.4 F (36.9 C)] 97.8 F (36.6 C) (05/26 0647) Pulse Rate:  [81-82] 81 (05/26 0647) Resp:  [16-18] 16 (05/26 0647) BP: (117-127)/(75-81) 117/75 mmHg (05/26 0647) SpO2:  [100 %] 100 % (05/26 0647) Last BM Date: 02/15/13  Intake/Output from previous day: 05/25 0701 - 05/26 0700 In: 1868.2 [I.V.:206.1; TPN:1662.1] Out: 1570 [Urine:1550; Drains:20] Intake/Output this shift: Total I/O In: -  Out: 550 [Urine:550]  Incision/Wound:ostomy prolapsed but viable.  Soft non tender.  Less blood in urine  Lab Results:   Recent Labs  02/14/13 0450 02/15/13 0515  WBC 6.2 6.3  HGB 8.5* 8.3*  HCT 26.4* 25.5*  PLT 425* 398   BMET  Recent Labs  02/14/13 0913 02/15/13 0515  NA 139 141  K 3.4* 3.6  CL 103 104  CO2 25 26  GLUCOSE 95 79  BUN 31* 35*  CREATININE 1.01 1.13*  CALCIUM 9.8 10.0   PT/INR  Recent Labs  02/13/13 0605 02/14/13 0450  LABPROT 14.4 14.5  INR 1.14 1.15   ABG No results found for this basename: PHART, PCO2, PO2, HCO3,  in the last 72 hours  Studies/Results: Dg Chest 2 View  02/14/2013   *RADIOLOGY REPORT*  Clinical Data: Shortness of breath.  Slight cough.  Slight nausea. Smoker.  CHEST - 2 VIEW  Comparison: 02/01/2013.  Findings: Stable enlarged cardiac silhouette.  No definite pleural fluid.  Normal vasculature.  Right PICC tip in the inferior aspect of the superior vena cava.  Unremarkable bones.  IMPRESSION: No acute abnormality.  Stable cardiomegaly.   Original Report Authenticated By: Beckie Salts, M.D.   Nm Pulmonary Perf And Vent  02/14/2013   *RADIOLOGY REPORT*  Clinical Data: Shortness of breath.  NM PULMONARY VENTILATION AND PERFUSION SCAN  Radiopharmaceutical: CURIE MAA TECHNETIUM TO 23M ALBUMIN AGGREGATED and 40 mCi technetium 46m DTPA.  Comparison: Chest radiographs obtained earlier today.  Findings: Normal  ventilation and perfusion of both lungs.  No perfusion defects are seen.  IMPRESSION: Normal examination.  No evidence of pulmonary embolism.   Original Report Authenticated By: Beckie Salts, M.D.    Anti-infectives: Anti-infectives   Start     Dose/Rate Route Frequency Ordered Stop   02/03/13 1315  ertapenem (INVANZ) 1 g in sodium chloride 0.9 % 50 mL IVPB  Status:  Discontinued     1 g 100 mL/hr over 30 Minutes Intravenous  Once 02/03/13 1312 02/03/13 1650   02/03/13 0800  ertapenem (INVANZ) 1 g in sodium chloride 0.9 % 50 mL IVPB  Status:  Discontinued     1 g 100 mL/hr over 30 Minutes Intravenous Every 24 hours 02/03/13 0759 02/03/13 1702      Assessment/Plan: s/p Procedure(s): COLOSTOMY REVISION (N/A) p Procedure(s):  COLOSTOMY REVISION (N/A)  1. Prolapsed ostomy, s/p repair with recurrent non-ischemic prolapse  2. enterocolonic fistula, needs repair, scheduled for 02-16-13  3. Hypoalbuminemia  4. Bilateral lower extremity edema, now secondary to # 13, 14  5. Hematuria  6. HTN  7. H/o anemia secondary to ABL from severe thrombocytopenia from sepsis, still with anemia  8. H/o cervical cancer, s/p hysterectomy and radiation  9. PCM/TNA (prealbumin 12)  10. Hypokalemia  11. Lower extremity pain/edema  12. Bilateral hydronephrosis  13. New diagnosed cardiomyopathy with EF of 20% with global hypokinesis  14. Pulmonary hypertension  15. Right-sided femoral DVT  Plan:  1.  Surgery  on hold indefinitely for now.  2. Hold anticoagulation due to hematuria for now.. Greatly appreciate medicine and cardiology's assistance with this patient. D/W Dr Rennis Golden. 4. diuresis helping but Cr going up.  Will limit TNA volume.   5. Cont diuresis per cardiology as needed  6. Will need to set up [facility] for TNA at home. She may need other services, we'll see  7. No treatment plans for now for hydronephrosis. Will need outpatient cystoscopy.  8. K better today, will cont to follow especially since getting  lasix  9. Cont diet and decrease TNA given cardiomyopathy. 10. Check labs in the am.   LOS: 12 days    Emery Dupuy A. 02/15/2013

## 2013-02-15 NOTE — Progress Notes (Signed)
Patient ID: NORALYN KARIM  female  NWG:[id number]    DOB: 1976/01/23    DOA: 02/03/2013  PCP: Dorrene German, MD  Assessment/Plan: Hypertension: BP stable  Cardiomyopathy with EF of 20%, global hypokinesis and Peripheral edema:  - Appreciate cardiology for evaluating her, will continue Lasix for negative balance as she is receiving TPN as well -  On Coreg and ACEI, BIDIL, lasix.  - Cardiac MRI planned, VQ scan neg for PE  Acute DVT right lower extremity  - Hold Lovenox and Coumadin for another one or 2 days. Will start back Coumadin once hematuria is cleared.  Hematuria: Improving per patient  - d/w Dr Sherron Monday (on call urology), did not recommend any urological intervention for hematuria. She has a cystoscopy planned by Dr. Mena Goes outpatient. No need of foley or bladder irrigation unless any obstruction or bigger clots. -  also appears to have UTI on urine culture, will recheck culture, placed on rocephin for now   Mild bilateral hydronephrosis: Renal function is normal  - Urology following, outpatient cystoscopy planned  Thrombocytosis: Likely reactive this appears be stable.   prolapsed ostomy, s/p repair with recurrent non-ischemic prolapse  - Per [facility]  DVT Prophylaxis:  Code Status:  Disposition: per primary service. I will continue to follow her closely.      Subjective: Hematuria  is improving, ambulating in the room, peripheral edema is improving however still somewhat short of breath.  Objective: Weight change:   Intake/Output Summary (Last 24 hours) at 02/15/13 1146 Last data filed at 02/15/13 0800  Gross per 24 hour  Intake 1868.23 ml  Output   1720 ml  Net 148.23 ml   Blood pressure 116/78, pulse 76, temperature 97.8 F (36.6 C), temperature source Oral, resp. rate 16, height 5\' 4"  (1.626 m), weight 66.2 kg (145 lb 15.1 oz), SpO2 100.00%.  Physical Exam: General: A x O x3, NADs. CVS: S1-S2 clear, Chest: CTAB Abdomen: soft mild distended and  tender around her ostomy Extremities: no cyanosis, clubbing. Trace- 1+  edema bilaterally, L >R    Lab Results: Basic Metabolic Panel:  Recent Labs Lab 02/14/13 0913 02/15/13 0515  NA 139 141  K 3.4* 3.6  CL 103 104  CO2 25 26  GLUCOSE 95 79  BUN 31* 35*  CREATININE 1.01 1.13*  CALCIUM 9.8 10.0  MG 1.8 1.9  PHOS 4.8* 5.0*   Liver Function Tests:  Recent Labs Lab 02/11/13 0525 02/15/13 0515  AST 15 37  ALT 8 28  ALKPHOS 78 70  BILITOT 0.1* 0.1*  PROT 6.0 6.3  ALBUMIN 2.3* 2.4*   No results found for this basename: LIPASE, AMYLASE,  in the last 168 hours No results found for this basename: AMMONIA,  in the last 168 hours CBC:  Recent Labs Lab 02/14/13 0450 02/15/13 0515  WBC 6.2 6.3  NEUTROABS  --  3.7  HGB 8.5* 8.3*  HCT 26.4* 25.5*  MCV 89.5 89.8  PLT 425* 398   Cardiac Enzymes: No results found for this basename: CKTOTAL, CKMB, CKMBINDEX, TROPONINI,  in the last 168 hours BNP: No components found with this basename: POCBNP,  CBG:  Recent Labs Lab 02/09/13 2327 02/10/13 0334 02/10/13 0737 02/10/13 1154 02/10/13 1814  GLUCAP 117* 119* 102* 117* 98     Micro Results: Recent Results (from the past 240 hour(s))  URINE CULTURE     Status: None   Collection Time    02/07/13  4:59 PM      Result Value Range  Status   Specimen Description URINE, CLEAN CATCH   Final   Special Requests NONE   Final   Culture  Setup Time 02/07/2013 22:10   Final   Colony Count >=100,000 COLONIES/ML   Final   Culture     Final   Value: Multiple bacterial morphotypes present, none predominant. Suggest appropriate recollection if clinically indicated.   Report Status 02/08/2013 FINAL   Final  URINE CULTURE     Status: None   Collection Time    02/14/13  8:14 AM      Result Value Range Status   Specimen Description URINE, RANDOM   Final   Special Requests NONE   Final   Culture  Setup Time 02/14/2013 19:29   Final   Colony Count >=100,000 COLONIES/ML   Final    Culture     Final   Value: Multiple bacterial morphotypes present, none predominant. Suggest appropriate recollection if clinically indicated.   Report Status 02/15/2013 FINAL   Final    Studies/Results: Ct Abdomen Pelvis W Contrast  01/28/2013   *RADIOLOGY REPORT*  Clinical Data: Left lower quadrant abdominal pain with lower extremity and abdominal edema.  Left lower quadrant drain placed 7 months ago.  History of cervical cancer, radiation and chemotherapy, sigmoid colectomy with ostomy.  CT ABDOMEN AND PELVIS WITH CONTRAST  Technique:  Multidetector CT imaging of the abdomen and pelvis was performed following the standard protocol during bolus administration of intravenous contrast.  Contrast: 80mL OMNIPAQUE IOHEXOL 300 MG/ML  SOLN  Comparison: Abdominal pelvic CT 12/03/2012.  Barium enema 01/18/2013.  Findings: Images through the lung bases demonstrate stable cardiomegaly and linear scarring or atelectasis at both lung bases. There are trace bilateral pleural effusions.  There is mild ascites with diffuse soft tissue edema consistent with anasarca.  The abdomen is imaged prior to portal vein opacification.  There is some retrograde filling of the IVC and hepatic veins.  The hepatic steatosis has improved compared with the most recent study.  The previously noted low density lesions in the right and left hepatic lobes are grossly stable. The gallbladder is surgically absent. There is a stable nonobstructing calculus in the lower pole of the left kidney.  There is no hydronephrosis.  The right kidney, spleen, pancreas and adrenal glands appear normal.  Descending colostomy and left lower quadrant percutaneous drain appear unchanged.  Evaluation of the pelvic anatomy is limited by the anasarca and arterial phase imaging.  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 There is likely a small amount of residual extraluminal air and fluid in the left lower quadrant inferior and anterior to the surgical drain.  This appears grossly unchanged.  There is diffuse bladder wall thickening.  There is diffuse thickening of the wall of the rectosigmoid colon and perirectal soft tissue stranding. There is still some air within the vagina, but no contrast.  There are no worrisome osseous findings.  IMPRESSION:  1.  Progressive anasarca with diffuse soft tissue edema, ascites and trace pleural effusions. 2.  Cardiomegaly with reflux of contrasted blood into the IVC and hepatic veins suggesting a degree of right heart failure. 3.  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  No large drainable collection identified.   Original  Report Authenticated By: Carey Bullocks, M.D.   Dg Abd 2 Views  02/08/2013   *RADIOLOGY REPORT*  Clinical Data: Prolapsing colostomy and abdominal distention.  ABDOMEN - 2 VIEW  Comparison: 02/01/2013  Findings: There is a left lower quadrant colostomy.  Left lower quadrant pigtail drainage catheter is identified in the projection of the left iliac bone.  There is moderate gaseous distention of the transverse colon.  A few prominent small bowel loops are noted which measure up to 2.8 cm.  IMPRESSION:  1.  Moderate gaseous distention of the transverse colon. 2.  Pigtail drainage catheter projects over the left iliac bone and is presumably within the left iliac fossa as before.   Original Report Authenticated By: Signa Kell, M.D.   Dg Abd Acute W/chest  02/01/2013   *RADIOLOGY REPORT*  Clinical Data: Abdominal pain  ACUTE ABDOMEN SERIES (ABDOMEN 2 VIEW & CHEST 1 VIEW)  Comparison: 01/28/2013 CT  Findings: Cardiomegaly.  Central vascular congestion. No focal consolidation.  Surgical clips right upper  quadrant.  No free intraperitoneal air. Surgical drain projects over the left lower quadrant.  There is air within normal caliber colon and a small amount of contrast within the left lower colon projecting just above the colostomy. There is also high attenuation over the colostomy, most in keeping with ingested contrast from the recent comparison. 2 small nonspecific metallic densities project over the mid pelvis. Gentle leftward curvature of the spine.  No acute osseous finding.  IMPRESSION: Surgical drain projects over the left lower abdomen/upper pelvis.  Nonspecific bowel gas pattern without overt obstruction.   Original Report Authenticated By: Jearld Lesch, M.D.   Dg Colon W/cm - Wo/w Kub  01/18/2013   *RADIOLOGY REPORT*  Clinical Data: No complex surgical history.  The patient has a cervical cancer radiation therapy the pelvis.  Subsequent a diverting colostomy and Hartmann's pouch.  Persistent drainage of stool through the Hartmann's pouch.  The patient additionally has a percutaneous drain in the pelvis.  The  SINGLE COLUMN BARIUM ENEMA  Technique:  Initial scout AP supine abdominal image obtained to insure adequate colon cleansing.  Barium was introduced into the colon in a retrograde fashion and refluxed from the rectum to the cecum. Spot images of the colon followed by overhead radiographs were obtained.  Fluoroscopy time: 2 minutes 29 seconds  Comparison:  CT 12/03/2012, and fistulogram, 08/25/2012, small bowel follow through the 09/20/2012  Findings:  A Foley catheter was inserted per rectum and 5 cc  gas insufflated into the retention balloon.  Under fluoroscopic observation are soluble contrast was administered via gravity feed per rectum.  Contrast filled the Hartmann's pouch.  Contrast was quickly seen within the cecum.  Contrast was also quickly seen within the distal small bowel.  Contrast flowed antegrade into the ascending and transverse colon to the level of the splenic flexure. The is  high density contrast within the terminal ileum.  No significant contrast flowed into the percutaneous drain.  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  Findings discussed with Dr. [name] on 01/18/2013.   Original Report Authenticated By: Genevive Bi, M.D.    Medications: Scheduled Meds: . carvedilol  6.25 mg Oral BID WC  . feeding supplement  237 mL Oral BID BM  . furosemide  40 mg Oral BID  .  isosorbide-hydrALAZINE  1 tablet Oral BID  . lisinopril  10 mg Oral Daily  . pantoprazole  40 mg Oral QHS  . traMADol  50 mg Oral Q6H      LOS: 12 days   Derrisha Foos M.D. [facility] 02/15/2013, 11:46 AM Pager: [phone]  If 7PM-7AM, please contact night-coverage [email or web address] Password [name]

## 2013-02-16 ENCOUNTER — Inpatient Hospital Stay (HOSPITAL_COMMUNITY): Payer: Medicaid Other

## 2013-02-16 ENCOUNTER — Encounter (HOSPITAL_COMMUNITY): Admission: RE | Payer: Self-pay | Source: Ambulatory Visit

## 2013-02-16 ENCOUNTER — Ambulatory Visit (HOSPITAL_COMMUNITY): Admission: RE | Admit: 2013-02-16 | Payer: Medicaid Other | Source: Ambulatory Visit | Admitting: General Surgery

## 2013-02-16 DIAGNOSIS — K632 Fistula of intestine: Secondary | ICD-10-CM

## 2013-02-16 DIAGNOSIS — I428 Other cardiomyopathies: Secondary | ICD-10-CM

## 2013-02-16 LAB — URINE CULTURE: Colony Count: 80000

## 2013-02-16 LAB — BASIC METABOLIC PANEL
BUN: 32 mg/dL — ABNORMAL HIGH (ref 6–23)
Chloride: 103 mEq/L (ref 96–112)
Creatinine, Ser: 1 mg/dL (ref 0.50–1.10)
GFR calc Af Amer: 82 mL/min — ABNORMAL LOW (ref >90)
Glucose, Bld: 82 mg/dL (ref 70–99)
Potassium: 2.8 mEq/L — ABNORMAL LOW (ref 3.5–5.1)

## 2013-02-16 SURGERY — COLECTOMY, PARTIAL
Anesthesia: General

## 2013-02-16 MED ORDER — SPIRONOLACTONE 12.5 MG HALF TABLET
12.5000 mg | ORAL_TABLET | Freq: Every day | ORAL | Status: DC
  Administered 2013-02-16 – 2013-02-19 (×4): 12.5 mg via ORAL
  Filled 2013-02-16 (×4): qty 1

## 2013-02-16 MED ORDER — GADOBENATE DIMEGLUMINE 529 MG/ML IV SOLN
20.0000 mL | Freq: Once | INTRAVENOUS | Status: AC
  Administered 2013-02-16: 20 mL via INTRAVENOUS

## 2013-02-16 MED ORDER — ENOXAPARIN SODIUM 60 MG/0.6ML ~~LOC~~ SOLN
60.0000 mg | Freq: Two times a day (BID) | SUBCUTANEOUS | Status: DC
  Administered 2013-02-16 – 2013-02-18 (×5): 60 mg via SUBCUTANEOUS
  Filled 2013-02-16 (×6): qty 0.6

## 2013-02-16 MED ORDER — POTASSIUM CHLORIDE 10 MEQ/50ML IV SOLN
10.0000 meq | INTRAVENOUS | Status: DC
  Filled 2013-02-16 (×4): qty 50

## 2013-02-16 MED ORDER — WARFARIN - PHARMACIST DOSING INPATIENT: Freq: Every day | Status: DC

## 2013-02-16 MED ORDER — WARFARIN SODIUM 7.5 MG PO TABS
7.5000 mg | ORAL_TABLET | Freq: Once | ORAL | Status: AC
  Administered 2013-02-16: 7.5 mg via ORAL
  Filled 2013-02-16: qty 1

## 2013-02-16 MED ORDER — POTASSIUM CHLORIDE 10 MEQ/50ML IV SOLN
10.0000 meq | INTRAVENOUS | Status: AC
  Administered 2013-02-16 (×3): 10 meq via INTRAVENOUS
  Filled 2013-02-16 (×4): qty 50

## 2013-02-16 MED ORDER — POTASSIUM CHLORIDE 10 MEQ/50ML IV SOLN
10.0000 meq | INTRAVENOUS | Status: AC
  Administered 2013-02-16 (×4): 10 meq via INTRAVENOUS
  Filled 2013-02-16 (×4): qty 50

## 2013-02-16 MED ORDER — LISINOPRIL 20 MG PO TABS
20.0000 mg | ORAL_TABLET | Freq: Every day | ORAL | Status: DC
  Administered 2013-02-17 – 2013-02-19 (×3): 20 mg via ORAL
  Filled 2013-02-16 (×3): qty 1

## 2013-02-16 MED ORDER — CLINIMIX E/DEXTROSE (5/15) 5 % IV SOLN
INTRAVENOUS | Status: AC
  Administered 2013-02-16: 18:00:00 via INTRAVENOUS
  Filled 2013-02-16: qty 2000

## 2013-02-16 NOTE — Progress Notes (Signed)
[facility] Progress Note 13 Days Post-Op  Subjective: Patient is so sweet and calm.  Wants to go home.Still on cyclic TPN.  Patient has a left femoral clot, but anticoagulation had to be stopped because of hematuria.  Objective: Vital signs in last 24 hours: Temp:  [97.8 F (36.6 C)-98.4 F (36.9 C)] 98.1 F (36.7 C) (05/27 0523) Pulse Rate:  [73-84] 82 (05/27 0523) Resp:  [16-18] 16 (05/27 0523) BP: (116-129)/(74-84) 129/74 mmHg (05/27 0523) SpO2:  [100 %] 100 % (05/27 0523) Weight:  [66.8 kg (147 lb 4.3 oz)] 66.8 kg (147 lb 4.3 oz) (05/27 0523) Last BM Date: 02/15/13  Intake/Output from previous day: 05/26 0701 - 05/27 0700 In: 2837.8 [P.O.:840; I.V.:469; IV Piggyback:50; TPN:1478.8] Out: 4520 [Urine:4350; Drains:20; Stool:150] Intake/Output this shift:    General: No acute distress.  Colostomy starting to prolapse again, but not ischemic.  Lungs: Clear to auscultation  Abd: Prolapsed, non-ischemic colostomy, excellent bowel sounds.  Having a significant amount of output from anus.  Entero-colonic fistula very active.  Albumin 2.4.  Prealbumin 18.3 yesterday.  Extremities: Left LE swelling, DVT.  Needs either anticoagulation or IVC filter  Neuro: Intact  Lab Results:  @LABLAST2 (wbc:2,hgb:2,hct:2,plt:2) BMET  Recent Labs  02/15/13 0515 02/16/13 0530  NA 141 139  K 3.6 2.8*  CL 104 103  CO2 26 28  GLUCOSE 79 82  BUN 35* 32*  CREATININE 1.13* 1.00  CALCIUM 10.0 10.0   PT/INR  Recent Labs  02/14/13 0450  LABPROT 14.5  INR 1.15   ABG No results found for this basename: PHART, PCO2, PO2, HCO3,  in the last 72 hours  Studies/Results: Dg Chest 2 View  02/14/2013   *RADIOLOGY REPORT*  Clinical Data: Shortness of breath.  Slight cough.  Slight nausea. Smoker.  CHEST - 2 VIEW  Comparison: 02/01/2013.  Findings: Stable enlarged cardiac silhouette.  No definite pleural fluid.  Normal vasculature.  Right PICC tip in the inferior aspect of the superior vena cava.   Unremarkable bones.  IMPRESSION: No acute abnormality.  Stable cardiomegaly.   Original Report Authenticated By: Beckie Salts, M.D.   Nm Pulmonary Perf And Vent  02/14/2013   *RADIOLOGY REPORT*  Clinical Data: Shortness of breath.  NM PULMONARY VENTILATION AND PERFUSION SCAN  Radiopharmaceutical: CURIE MAA TECHNETIUM TO 74M ALBUMIN AGGREGATED and 40 mCi technetium 55m DTPA.  Comparison: Chest radiographs obtained earlier today.  Findings: Normal ventilation and perfusion of both lungs.  No perfusion defects are seen.  IMPRESSION: Normal examination.  No evidence of pulmonary embolism.   Original Report Authenticated By: Beckie Salts, M.D.    Anti-infectives: Anti-infectives   Start     Dose/Rate Route Frequency Ordered Stop   02/15/13 1300  cefTRIAXone (ROCEPHIN) 1 g in dextrose 5 % 50 mL IVPB     1 g 100 mL/hr over 30 Minutes Intravenous Every 24 hours 02/15/13 1151     02/03/13 1315  ertapenem (INVANZ) 1 g in sodium chloride 0.9 % 50 mL IVPB  Status:  Discontinued     1 g 100 mL/hr over 30 Minutes Intravenous  Once 02/03/13 1312 02/03/13 1650   02/03/13 0800  ertapenem (INVANZ) 1 g in sodium chloride 0.9 % 50 mL IVPB  Status:  Discontinued     1 g 100 mL/hr over 30 Minutes Intravenous Every 24 hours 02/03/13 0759 02/03/13 1702      Assessment/Plan: s/p Procedure(s): COLOSTOMY REVISION Advance diet Restart therapeutic Lovenox Either find time to take patient to OR soon, or let  her be discharged.  V/Q scan was normal without evidence of DVt.  Not sure why patient has pulmonary hypertension and global hypokinesis.  Is it worth repeating her echocardiogram? Will check BNP, and restart therapeutic Lovenox.  LOS: 13 days   Marta Lamas. [name], MD, FACS [phone] [facility] 02/16/2013

## 2013-02-16 NOTE — Progress Notes (Signed)
PARENTERAL NUTRITION CONSULT NOTE - Follow up  Pharmacy Consult for TPN Indication: complex GI history with pending fistula repair  Allergies  Allergen Reactions  . Penicillins Other (See Comments)    "anything w/penicillin in it makes me bleed"  . Labetalol Hcl Itching and Other (See Comments)    Bumps to bilateral arms.  . Morphine And Related Hives    Patient Measurements: Height: 5\' 4"  (162.6 cm) Weight: 147 lb 4.3 oz (66.8 kg) IBW/kg (Calculated) : 54.7  Vital Signs: Temp: 98.1 F (36.7 C) (05/27 0523) Temp src: Oral (05/27 0523) BP: 129/74 mmHg (05/27 0523) Pulse Rate: 81 (05/27 0811) Intake/Output from previous day: 05/26 0701 - 05/27 0700 In: 2837.8 [P.O.:840; I.V.:469; IV Piggyback:50; TPN:1478.8] Out: 4520 [Urine:4350; Drains:20; Stool:150] Intake/Output from this shift: Total I/O In: -  Out: 300 [Urine:300]  Labs:  Recent Labs  02/14/13 0450 02/15/13 0515  WBC 6.2 6.3  HGB 8.5* 8.3*  HCT 26.4* 25.5*  PLT 425* 398  INR 1.15  --      Recent Labs  02/14/13 0913 02/15/13 0515 02/16/13 0530  NA 139 141 139  K 3.4* 3.6 2.8*  CL 103 104 103  CO2 25 26 28   GLUCOSE 95 79 82  BUN 31* 35* 32*  CREATININE 1.01 1.13* 1.00  CALCIUM 9.8 10.0 10.0  MG 1.8 1.9 1.6  PHOS 4.8* 5.0* 3.1  PROT  --  6.3  --   ALBUMIN  --  2.4*  --   AST  --  37  --   ALT  --  28  --   ALKPHOS  --  70  --   BILITOT  --  0.1*  --   PREALBUMIN  --  18.3  --   TRIG  --  56  --    Estimated Creatinine Clearance: 72.4 ml/min (by C-G formula based on Cr of 1).   No results found for this basename: GLUCAP,  in the last 72 hours  Medical History: Past Medical History  Diagnosis Date  . History of blood transfusion     "I had ~ 7 in 01/2012"  . Anemia   . Cervical cancer ~ 2011    cervical  . Radiation Nov.18,2011-Jan.23,2012    cervix  . Hypertension     no treatment necessary for BP  since July 2013  . Cardiomyopathy 02/14/2013    Insulin Requirements in the past  24 hours:  None- SSI has been discontinued  Current Nutrition:  Regular diet, also receiving Ensure BID BM; Clinimix NO LYTES 5/15:  33mL/hr x1 hour, then 158mL/hr over 10 hours, then 51mL/hr x1 hour for a total of over 12 hours. Provides 95g of protein/day and average of 1560kcal/day with fats given 3x week.   Assessment: Patient known to pharmacy from past TPN consults. She has a prolapsed ostomy which is s/p repair and is awaiting a fistula repair (scheduled for 5/27). Patient has extensive GI history. Was being worked up for possible obstruction but there does not appear to be one. Through conversation with RD and surgery PA, desire TPN at this point to ensure patient stays nutritionally stable prior to future procedures. Note plans to hold off on surgery indefinitely until nutritional status is improved  Nutritional Goals:  1800-1920 kCal, 80-95 grams of protein per day per RD recommendations 5/23  GI: s/p prolapsed colectomy revision, appears no obstruction, some BS and abd is soft but bloated, less nauseous. Having BM. PO PPI  Endo: no hx, has  not required insulin in her TPN in the past, SSI has been discontinued  Lytes: K stable at 2.8- currently receiving KCl runs x4 -- will need to watch closely as patient now on scheduled IV Lasix (also on lisinopril), phos down to 3.1, mag 1.6, corrected Ca 11.3 (Ca x Phos product ~33), other lytes nml.  Renal: SCr 1, CrCl ~73mL/min; has half NS running at 10-82mL/hr; urology recommends cytoscopy and retrograde pyelograms in OR to fully finish evaluation for hematuria  Pulm: RA  Cards: seen by cards for cardiomyopathy (EF 5/10 20%; pro-BNP >2000 5/22)- would like to perform Myoview, but will hold off d/t scheduled surgery next week. For now will be controlling BP and reducing afterload- VSS, on Lasix po BID, BiDil, lisinopril, and Lopressor  Hepatobil: alkphos, AST, ALT all nml, albumin low at 2.3, TBili low at 0.1; baseline prealbumin  12.1, recheck 11.7 (only 1 day after baseline check), 3rd check normal at 18.3; baseline trigs 71>56.  Neuro: no issues  ID: WBC 6.3, afebrile, no abx  Best Practices: Lovenox 60 q12- age indeterminate DVT found on doppler 5/22 (was held d/t hematuria but restarted 5/27, note plans to restart Coumadin soon)  TPN Access: PICC placed 5/19 TPN day#: started 5/19  Plan:  - will give 4 more runs of KCl when these 4 are complete to bring patient closer to normal range - Change formula to add lytes back - Clinimix E 5/15 continue to cycle- 57mL/hr x1 hour, then 127mL/hr over 10 hours, then 39mL/hr x1 hour for a total of over 12 hours - patient's nutritional status is improving based on improving prealbumin level. Will be difficult to limit TPN volume without compromising nutritional status - Attempted to contact cardiology in order to discuss fluid restriction. Unable to reach. Will try again tomorrow. - MVI/trace and IV Lipids MWF only due to ongoing shortages  - will continue IVF as half NS at 10-13mL/hr - will follow up diet tolerance - Routine TNA labs, AM BMET, mag and phos   Nathian Stencil D. Zamiah Tollett, PharmD Clinical Pharmacist Pager: [phone] 02/16/2013 8:49 AM

## 2013-02-16 NOTE — Progress Notes (Signed)
ANTICOAGULATION CONSULT NOTE - Initial Consult  Pharmacy Consult:  Lovenox Indication: RLE DVT  Allergies  Allergen Reactions  . Penicillins Other (See Comments)    "anything w/penicillin in it makes me bleed"  . Labetalol Hcl Itching and Other (See Comments)    Bumps to bilateral arms.  . Morphine And Related Hives    Patient Measurements: Height: 5\' 4"  (162.6 cm) Weight: 147 lb 4.3 oz (66.8 kg) IBW/kg (Calculated) : 54.7  Vital Signs: Temp: 98.1 F (36.7 C) (05/27 0523) Temp src: Oral (05/27 0523) BP: 129/74 mmHg (05/27 0523) Pulse Rate: 82 (05/27 0523)  Labs:  Recent Labs  02/14/13 0450 02/14/13 0913 02/15/13 0515 02/16/13 0530  HGB 8.5*  --  8.3*  --   HCT 26.4*  --  25.5*  --   PLT 425*  --  398  --   LABPROT 14.5  --   --   --   INR 1.15  --   --   --   CREATININE  --  1.01 1.13* 1.00    Estimated Creatinine Clearance: 72.4 ml/min (by C-G formula based on Cr of 1).     Assessment: 65 YOF previously on Lovenox and Coumadin for an age indeterminate RLE DVT seen on Doppler on 02/11/13.  Anticoagulations were held due to hematuria, now to resume as deemed appropriate by Surgery.  Patient's renal function remains appropriate to resume previous dose.  CBC stable.   Goal of Therapy:  Anti-Xa level 0.6-1.2 units/ml 4hrs after LMWH dose given Monitor platelets by anticoagulation protocol: Yes    Plan:  - Lovenox 60mg  SQ Q12H - CBC Q72H while on Lovenox - F/U start oral anticoagulation when able, additional KCL supplementation - Monitor for continued bleeding     Harmon Bommarito D. Laney Potash, PharmD, BCPS Pager:  [phone] 02/16/2013, 8:22 AM

## 2013-02-16 NOTE — Progress Notes (Signed)
ANTICOAGULATION CONSULT NOTE - Initial Consult  Pharmacy Consult for Coumdin Indication: DVT  Allergies  Allergen Reactions  . Penicillins Other (See Comments)    "anything w/penicillin in it makes me bleed"  . Labetalol Hcl Itching and Other (See Comments)    Bumps to bilateral arms.  . Morphine And Related Hives    Patient Measurements: Height: 5\' 4"  (162.6 cm) Weight: 147 lb 4.3 oz (66.8 kg) IBW/kg (Calculated) : 54.7 Heparin Dosing Weight:   Vital Signs: Temp: 98.1 F (36.7 C) (05/27 1435) Temp src: Oral (05/27 1435) BP: 130/84 mmHg (05/27 1435) Pulse Rate: 84 (05/27 1435)  Labs:  Recent Labs  02/14/13 0450 02/14/13 0913 02/15/13 0515 02/16/13 0530  HGB 8.5*  --  8.3*  --   HCT 26.4*  --  25.5*  --   PLT 425*  --  398  --   LABPROT 14.5  --   --   --   INR 1.15  --   --   --   CREATININE  --  1.01 1.13* 1.00    Estimated Creatinine Clearance: 72.4 ml/min (by C-G formula based on Cr of 1).   Medical History: Past Medical History  Diagnosis Date  . History of blood transfusion     "I had ~ 7 in 01/2012"  . Anemia   . Cervical cancer ~ 2011    cervical  . Radiation Nov.18,2011-Jan.23,2012    cervix  . Hypertension     no treatment necessary for BP  since July 2013  . Cardiomyopathy 02/14/2013    Medications:  Scheduled:  . carvedilol  6.25 mg Oral BID WC  . cefTRIAXone (ROCEPHIN)  IV  1 g Intravenous Q24H  . enoxaparin (LOVENOX) injection  60 mg Subcutaneous Q12H  . feeding supplement  237 mL Oral BID BM  . furosemide  40 mg Oral BID  . isosorbide-hydrALAZINE  1 tablet Oral BID  . [START ON 02/17/2013] lisinopril  20 mg Oral Daily  . pantoprazole  40 mg Oral QHS  . potassium chloride  10 mEq Intravenous Q1 Hr x 4  . spironolactone  12.5 mg Oral Daily  . traMADol  50 mg Oral Q6H  . warfarin  7.5 mg Oral ONCE-1800  . Warfarin - Pharmacist Dosing Inpatient   Does not apply q1800    Assessment: 37 yr old patient, admitted for abdominal pain.  Was then found to have a DVT in right lower extremity.  Pt was also having hematuria. Hematuria has now resolved and coumadin has been added for this pt. Goal of Therapy:  INR 2-3    Plan:  Will give coumadin 7.5 mg tonight. PT/INR q am Ordered heparin booklet and video for pt.  Eugene Garnet 02/16/2013,4:04 PM

## 2013-02-16 NOTE — Progress Notes (Signed)
Patient ID: Vanessa [name]  female  ZHY:[id number]    DOB: 1976/04/29    DOA: 02/03/2013  PCP: Dorrene German, MD  Assessment/Plan: Hypertension: BP stable  Cardiomyopathy with EF of 20%, global hypokinesis and Peripheral edema:  - On Coreg and ACEI, BIDIL, lasix. continue Lasix for negative balance as she is receiving TPN as well - Cardiac MRI planned today, VQ scan neg for PE - Management per [facility] cardiology  Acute DVT right lower extremity  - Started on Lovenox by surgery today as hematuria has now cleared per patient. Consider starting Coumadin unless plans of surgery this hospitalization. Cardiology does not recommend IVC filter.  Hematuria: Resolved per patient - On Rocephin for now, follow urine culture   Mild bilateral hydronephrosis: Renal function is normal  - Urology following, outpatient cystoscopy planned  Thrombocytosis: Likely reactive, stable.   prolapsed ostomy, s/p repair with recurrent non-ischemic prolapse  - Per [facility]  DVT Prophylaxis:  Code Status:  Disposition: per primary service.      Subjective: Hematuria cleared up the patient. Mild shortness of breath. Peripheral edema has significantly improved  Objective: Weight change:   Intake/Output Summary (Last 24 hours) at 02/16/13 1404 Last data filed at 02/16/13 0814  Gross per 24 hour  Intake 1957.83 ml  Output   3670 ml  Net -1712.17 ml   Blood pressure 129/74, pulse 81, temperature 98.1 F (36.7 C), temperature source Oral, resp. rate 16, height 5\' 4"  (1.626 m), weight 66.8 kg (147 lb 4.3 oz), SpO2 100.00%.  Physical Exam: General: No acute distress, pleasant and cooperative CVS: S1-S2 clear, Chest: CTAB Abdomen: soft normal bowel sounds Extremities: no cyanosis, clubbing. Trace- 1+  edema bilaterally, L >R    Lab Results: Basic Metabolic Panel:  Recent Labs Lab 02/15/13 0515 02/16/13 0530  NA 141 139  K 3.6 2.8*  CL 104 103  CO2 26 28  GLUCOSE 79 82  BUN 35* 32*   CREATININE 1.13* 1.00  CALCIUM 10.0 10.0  MG 1.9 1.6  PHOS 5.0* 3.1   Liver Function Tests:  Recent Labs Lab 02/11/13 0525 02/15/13 0515  AST 15 37  ALT 8 28  ALKPHOS 78 70  BILITOT 0.1* 0.1*  PROT 6.0 6.3  ALBUMIN 2.3* 2.4*   No results found for this basename: LIPASE, AMYLASE,  in the last 168 hours No results found for this basename: AMMONIA,  in the last 168 hours CBC:  Recent Labs Lab 02/14/13 0450 02/15/13 0515  WBC 6.2 6.3  NEUTROABS  --  3.7  HGB 8.5* 8.3*  HCT 26.4* 25.5*  MCV 89.5 89.8  PLT 425* 398   Cardiac Enzymes: No results found for this basename: CKTOTAL, CKMB, CKMBINDEX, TROPONINI,  in the last 168 hours BNP: No components found with this basename: POCBNP,  CBG:  Recent Labs Lab 02/09/13 2327 02/10/13 0334 02/10/13 0737 02/10/13 1154 02/10/13 1814  GLUCAP 117* 119* 102* 117* 98     Micro Results: Recent Results (from the past 240 hour(s))  URINE CULTURE     Status: None   Collection Time    02/07/13  4:59 PM      Result Value Range Status   Specimen Description URINE, CLEAN CATCH   Final   Special Requests NONE   Final   Culture  Setup Time 02/07/2013 22:10   Final   Colony Count >=100,000 COLONIES/ML   Final   Culture     Final   Value: Multiple bacterial morphotypes present, none predominant. Suggest appropriate  recollection if clinically indicated.   Report Status 02/08/2013 FINAL   Final  URINE CULTURE     Status: None   Collection Time    02/14/13  8:14 AM      Result Value Range Status   Specimen Description URINE, RANDOM   Final   Special Requests NONE   Final   Culture  Setup Time 02/14/2013 19:29   Final   Colony Count >=100,000 COLONIES/ML   Final   Culture     Final   Value: Multiple bacterial morphotypes present, none predominant. Suggest appropriate recollection if clinically indicated.   Report Status 02/15/2013 FINAL   Final    Studies/Results: Ct Abdomen Pelvis W Contrast  01/28/2013   *RADIOLOGY REPORT*   Clinical Data: Left lower quadrant abdominal pain with lower extremity and abdominal edema.  Left lower quadrant drain placed 7 months ago.  History of cervical cancer, radiation and chemotherapy, sigmoid colectomy with ostomy.  CT ABDOMEN AND PELVIS WITH CONTRAST  Technique:  Multidetector CT imaging of the abdomen and pelvis was performed following the standard protocol during bolus administration of intravenous contrast.  Contrast: 80mL OMNIPAQUE IOHEXOL 300 MG/ML  SOLN  Comparison: Abdominal pelvic CT 12/03/2012.  Barium enema 01/18/2013.  Findings: Images through the lung bases demonstrate stable cardiomegaly and linear scarring or atelectasis at both lung bases. There are trace bilateral pleural effusions.  There is mild ascites with diffuse soft tissue edema consistent with anasarca.  The abdomen is imaged prior to portal vein opacification.  There is some retrograde filling of the IVC and hepatic veins.  The hepatic steatosis has improved compared with the most recent study.  The previously noted low density lesions in the right and left hepatic lobes are grossly stable. The gallbladder is surgically absent. There is a stable nonobstructing calculus in the lower pole of the left kidney.  There is no hydronephrosis.  The right kidney, spleen, pancreas and adrenal glands appear normal.  Descending colostomy and left lower quadrant percutaneous drain appear unchanged.  Evaluation of the pelvic anatomy is limited by the anasarca and arterial phase imaging.  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 There is likely a small amount of residual extraluminal air and fluid in the left lower quadrant inferior and anterior to the surgical drain.  This appears grossly unchanged.  There is diffuse bladder wall thickening.  There is diffuse  thickening of the wall of the rectosigmoid colon and perirectal soft tissue stranding. There is still some air within the vagina, but no contrast.  There are no worrisome osseous findings.  IMPRESSION:  1.  Progressive anasarca with diffuse soft tissue edema, ascites and trace pleural effusions. 2.  Cardiomegaly with reflux of contrasted blood into the IVC and hepatic veins suggesting a degree of right heart failure. 3.  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  No large drainable collection identified.   Original Report Authenticated By: Carey Bullocks, M.D.   Dg Abd 2 Views  02/08/2013   *RADIOLOGY REPORT*  Clinical Data: Prolapsing colostomy and abdominal distention.  ABDOMEN - 2 VIEW  Comparison: 02/01/2013  Findings: There is a left lower quadrant colostomy.  Left lower quadrant pigtail drainage catheter is identified in the projection of the left  iliac bone.  There is moderate gaseous distention of the transverse colon.  A few prominent small bowel loops are noted which measure up to 2.8 cm.  IMPRESSION:  1.  Moderate gaseous distention of the transverse colon. 2.  Pigtail drainage catheter projects over the left iliac bone and is presumably within the left iliac fossa as before.   Original Report Authenticated By: Signa Kell, M.D.   Dg Abd Acute W/chest  02/01/2013   *RADIOLOGY REPORT*  Clinical Data: Abdominal pain  ACUTE ABDOMEN SERIES (ABDOMEN 2 VIEW & CHEST 1 VIEW)  Comparison: 01/28/2013 CT  Findings: Cardiomegaly.  Central vascular congestion. No focal consolidation.  Surgical clips right upper quadrant.  No free intraperitoneal air. Surgical drain projects over the left lower quadrant.  There is air within normal caliber colon and a small amount of contrast within the left lower colon projecting just above the colostomy. There is also high attenuation over the  colostomy, most in keeping with ingested contrast from the recent comparison. 2 small nonspecific metallic densities project over the mid pelvis. Gentle leftward curvature of the spine.  No acute osseous finding.  IMPRESSION: Surgical drain projects over the left lower abdomen/upper pelvis.  Nonspecific bowel gas pattern without overt obstruction.   Original Report Authenticated By: Jearld Lesch, M.D.   Dg Colon W/cm - Wo/w Kub  01/18/2013   *RADIOLOGY REPORT*  Clinical Data: No complex surgical history.  The patient has a cervical cancer radiation therapy the pelvis.  Subsequent a diverting colostomy and Hartmann's pouch.  Persistent drainage of stool through the Hartmann's pouch.  The patient additionally has a percutaneous drain in the pelvis.  The  SINGLE COLUMN BARIUM ENEMA  Technique:  Initial scout AP supine abdominal image obtained to insure adequate colon cleansing.  Barium was introduced into the colon in a retrograde fashion and refluxed from the rectum to the cecum. Spot images of the colon followed by overhead radiographs were obtained.  Fluoroscopy time: 2 minutes 29 seconds  Comparison:  CT 12/03/2012, and fistulogram, 08/25/2012, small bowel follow through the 09/20/2012  Findings:  A Foley catheter was inserted per rectum and 5 cc  gas insufflated into the retention balloon.  Under fluoroscopic observation are soluble contrast was administered via gravity feed per rectum.  Contrast filled the Hartmann's pouch.  Contrast was quickly seen within the cecum.  Contrast was also quickly seen within the distal small bowel.  Contrast flowed antegrade into the ascending and transverse colon to the level of the splenic flexure. The is high density contrast within the terminal ileum.  No significant contrast flowed into the percutaneous drain.  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  In examining the combination of studies, favor the  fistula  to occur between the sigmoid colon pouch and the distal small bowel.  Findings discussed with Dr. [name] on 01/18/2013.   Original Report Authenticated By: Genevive Bi, M.D.    Medications: Scheduled Meds: . carvedilol  6.25 mg Oral BID WC  . cefTRIAXone (ROCEPHIN)  IV  1 g Intravenous Q24H  . enoxaparin (LOVENOX) injection  60 mg Subcutaneous Q12H  . feeding supplement  237 mL Oral BID BM  . furosemide  40 mg Oral BID  . isosorbide-hydrALAZINE  1 tablet Oral BID  . lisinopril  10 mg Oral Daily  . pantoprazole  40 mg Oral QHS  . potassium chloride  10 mEq Intravenous Q1 Hr x 4  .  traMADol  50 mg Oral Q6H      LOS: 13 days   Trysten Berti M.D. [facility] 02/16/2013, 2:04 PM Pager: [phone]  If 7PM-7AM, please contact night-coverage [email or web address] Password [name]

## 2013-02-16 NOTE — Progress Notes (Signed)
[facility]  Subjective: SOB and LEE improved. Still has mild orthopnea. No chest pain. No further hematuria.   Objective: Vital signs in last 24 hours: Temp:  [97.8 F (36.6 C)-98.4 F (36.9 C)] 98.1 F (36.7 C) (05/27 0523) Pulse Rate:  [73-84] 81 (05/27 0811) Resp:  [16-18] 16 (05/27 0523) BP: (116-129)/(74-84) 129/74 mmHg (05/27 0523) SpO2:  [100 %] 100 % (05/27 0523) Weight:  [147 lb 4.3 oz (66.8 kg)] 147 lb 4.3 oz (66.8 kg) (05/27 0523) Last BM Date: 02/15/13  Intake/Output from previous day: 05/26 0701 - 05/27 0700 In: 2837.8 [P.O.:840; I.V.:469; IV Piggyback:50; TPN:1478.8] Out: 4520 [Urine:4350; Drains:20; Stool:150] Intake/Output this shift: Total I/O In: -  Out: 300 [Urine:300]  Medications Current Facility-Administered Medications  Medication Dose Route Frequency Provider Last Rate Last Dose  . 0.45 % sodium chloride infusion   Intravenous Continuous Lauren Bajbus, RPH 20 mL/hr at 02/14/13 2359 20 mL/hr at 02/14/13 2359  . carvedilol (COREG) tablet 6.25 mg  6.25 mg Oral BID WC Nada Boozer, NP   6.25 mg at 02/16/13 0811  . cefTRIAXone (ROCEPHIN) 1 g in dextrose 5 % 50 mL IVPB  1 g Intravenous Q24H Ripudeep K Rai, MD   1 g at 02/15/13 1517  . enoxaparin (LOVENOX) injection 60 mg  60 mg Subcutaneous Q12H Thuy Dien Dang, RPH   60 mg at 02/16/13 1610  . TPN [facility]) Adult without lytes   Intravenous Cyclic-See Admin Instructions Drusilla Kanner, [facility]       And  . fat emulsion 20 % infusion 250 mL  250 mL Intravenous Continuous TPN Drusilla Kanner, RPH 20 mL/hr at 02/15/13 1815 250 mL at 02/15/13 1815  . feeding supplement (ENSURE COMPLETE) liquid 237 mL  237 mL Oral BID BM Cherylynn Ridges, MD   237 mL at 02/16/13 1000  . furosemide (LASIX) tablet 40 mg  40 mg Oral BID Chrystie Nose, MD   40 mg at 02/16/13 0811  . hydrALAZINE (APRESOLINE) injection 5 mg  5 mg Intravenous Q4H PRN Jerald Kief, MD   5 mg at 02/10/13 2119  .  HYDROmorphone (DILAUDID) injection 0.5-2 mg  0.5-2 mg Intravenous Q4H PRN Cherylynn Ridges, MD   2 mg at 02/16/13 0524  . isosorbide-hydrALAZINE (BIDIL) 20-37.5 MG per tablet 1 tablet  1 tablet Oral BID Wilburt Finlay, PA-C   1 tablet at 02/16/13 0954  . lisinopril (PRINIVIL,ZESTRIL) tablet 10 mg  10 mg Oral Daily Jerald Kief, MD   10 mg at 02/16/13 0953  . ondansetron (ZOFRAN) injection 4 mg  4 mg Intravenous Q6H PRN Cherylynn Ridges, MD   4 mg at 02/14/13 1950  . oxyCODONE (Oxy IR/ROXICODONE) immediate release tablet 5-15 mg  5-15 mg Oral Q4H PRN Liz Malady, MD   15 mg at 02/16/13 9604  . pantoprazole (PROTONIX) EC tablet 40 mg  40 mg Oral QHS Herby Abraham, RPH   40 mg at 02/15/13 2100  . potassium chloride 10 mEq in 50 mL *CENTRAL LINE* IVPB  10 mEq Intravenous Q1 Hr x 4 Cherylynn Ridges, MD   10 mEq at 02/16/13 0953  . potassium chloride 10 mEq in 50 mL *CENTRAL LINE* IVPB  10 mEq Intravenous Q1 Hr x 4 Lauren Bajbus, RPH      . promethazine (PHENERGAN) injection 6.25-12.5 mg  6.25-12.5 mg Intravenous Q6H PRN Letha Cape, PA-C   12.5 mg at 02/15/13 5409  . sodium chloride  0.9 % injection 10-40 mL  10-40 mL Intracatheter PRN Letha Cape, PA-C   10 mL at 02/16/13 0834  . traMADol (ULTRAM) tablet 50 mg  50 mg Oral Q6H Liz Malady, MD   50 mg at 02/15/13 1226    PE: General appearance: alert, cooperative and no distress Lungs: clear to auscultation bilaterally Heart: regular rate and rhythm Extremities: trace LEE Pulses: 2+ and symmetric Skin: warm and dry Neurologic: Grossly normal  Lab Results:   Recent Labs  02/14/13 0450 02/15/13 0515  WBC 6.2 6.3  HGB 8.5* 8.3*  HCT 26.4* 25.5*  PLT 425* 398   BMET  Recent Labs  02/14/13 0913 02/15/13 0515 02/16/13 0530  NA 139 141 139  K 3.4* 3.6 2.8*  CL 103 104 103  CO2 25 26 28   GLUCOSE 95 79 82  BUN 31* 35* 32*  CREATININE 1.01 1.13* 1.00  CALCIUM 9.8 10.0 10.0   PT/INR  Recent Labs  02/14/13 0450  LABPROT  14.5  INR 1.15   BNP (last 3 results)  Recent Labs  02/11/13 1815  PROBNP 2330.0*    Assessment/Plan  Principal Problem:   Colostomy prolapse, with revision  Active Problems:   Post-radiation rectal bleeding   Entero-colonic fistula   Essential hypertension, benign   Thrombocytosis   Acute combined systolic and diastolic heart failure   Pulmonary HTN, severe   Bilateral lower extremity edema   DVT, femoral, acute   Cardiomyopathy   Anemia, secondary to blood loss   Hypokalemia   Gross hematuria  Plan: Acute CHF improving after diuresis. SOB and LEE both improved. However, Pt still endorsing mild orthopnea. BNP pending. Scr improved to 1.0 (1.13 yesterday). Continue with PO lasix BID. She is hypokalemic with K+ of 2.8. Pt recieving supplemental K+. Continue with HF meds, BB and ACE-I. Plan for cMRI today to asses for CAD and tolook for reversible causes of her CM (EF of 20%). If no reversible lesions identified, will arrange for LifeVest prior to discharge. Pt denies hematuria for the past 3 days. Consider restarting Coumadin either today or tomorrow for DVT.     LOS: 13 days    Brittainy M. Delmer Islam 02/16/2013 10:21 AM  I have seen and examined the patient along with Robbie Lis, PA.  I have reviewed the chart, notes and new data.  I agree with PA's note.  Key new complaints: no longer dyspneic Key examination changes: no rales, S3 or JVD. Residual 2+ left calf edema Key new findings / data: improved renal function parameters.  MRI pending.  PLAN: Increase ACEi dose. Add spironolactone. Resume warfarin without heparin IV.  Thurmon Fair, MD, [facility] [phone] 02/16/2013, 3:17 PM

## 2013-02-17 ENCOUNTER — Encounter (HOSPITAL_COMMUNITY): Payer: Self-pay | Admitting: Cardiology

## 2013-02-17 DIAGNOSIS — R310 Gross hematuria: Secondary | ICD-10-CM

## 2013-02-17 DIAGNOSIS — Z789 Other specified health status: Secondary | ICD-10-CM

## 2013-02-17 DIAGNOSIS — D649 Anemia, unspecified: Secondary | ICD-10-CM

## 2013-02-17 DIAGNOSIS — Z9189 Other specified personal risk factors, not elsewhere classified: Secondary | ICD-10-CM

## 2013-02-17 HISTORY — DX: Other specified personal risk factors, not elsewhere classified: Z91.89

## 2013-02-17 LAB — CBC WITH DIFFERENTIAL/PLATELET
Basophils Absolute: 0.1 10*3/uL (ref 0.0–0.1)
Basophils Relative: 1 % (ref 0–1)
Eosinophils Absolute: 0.2 10*3/uL (ref 0.0–0.7)
HCT: 25.5 % — ABNORMAL LOW (ref 36.0–46.0)
Hemoglobin: 8.2 g/dL — ABNORMAL LOW (ref 12.0–15.0)
MCH: 28.7 pg (ref 26.0–34.0)
MCHC: 32.2 g/dL (ref 30.0–36.0)
Monocytes Absolute: 0.7 10*3/uL (ref 0.1–1.0)
Monocytes Relative: 12 % (ref 3–12)
RDW: 15.4 % (ref 11.5–15.5)

## 2013-02-17 LAB — BASIC METABOLIC PANEL
BUN: 33 mg/dL — ABNORMAL HIGH (ref 6–23)
Calcium: 10.5 mg/dL (ref 8.4–10.5)
Creatinine, Ser: 1.07 mg/dL (ref 0.50–1.10)
GFR calc Af Amer: 76 mL/min — ABNORMAL LOW (ref >90)
GFR calc non Af Amer: 65 mL/min — ABNORMAL LOW (ref >90)

## 2013-02-17 LAB — PROTIME-INR: INR: 1.28 (ref 0.00–1.49)

## 2013-02-17 LAB — MAGNESIUM: Magnesium: 1.8 mg/dL (ref 1.5–2.5)

## 2013-02-17 MED ORDER — FAT EMULSION 20 % IV EMUL
240.0000 mL | INTRAVENOUS | Status: AC
  Administered 2013-02-17: 240 mL via INTRAVENOUS
  Filled 2013-02-17: qty 250

## 2013-02-17 MED ORDER — TRACE MINERALS CR-CU-F-FE-I-MN-MO-SE-ZN IV SOLN
INTRAVENOUS | Status: AC
  Administered 2013-02-17: 18:00:00 via INTRAVENOUS
  Filled 2013-02-17: qty 2000

## 2013-02-17 MED ORDER — POTASSIUM CHLORIDE CRYS ER 20 MEQ PO TBCR
20.0000 meq | EXTENDED_RELEASE_TABLET | Freq: Two times a day (BID) | ORAL | Status: AC
  Administered 2013-02-17 (×2): 20 meq via ORAL
  Filled 2013-02-17 (×2): qty 1

## 2013-02-17 MED ORDER — WARFARIN SODIUM 5 MG PO TABS
5.0000 mg | ORAL_TABLET | Freq: Once | ORAL | Status: AC
  Administered 2013-02-17: 5 mg via ORAL
  Filled 2013-02-17: qty 1

## 2013-02-17 NOTE — Progress Notes (Signed)
Patient urine blood tinged this morning.Patient was restarted on lovenox last night .Will notify MD on am rounds.

## 2013-02-17 NOTE — Progress Notes (Signed)
Life vest ordered. Leone Brand, NP

## 2013-02-17 NOTE — Progress Notes (Signed)
Patient ID: Vanessa [name]  female  ZOX:[id number]    DOB: [birth year]    DOA: 02/03/2013  PCP: Dorrene German, MD  Assessment/Plan: Hypertension: BP stable  Cardiomyopathy with EF of 20%, global hypokinesis and Peripheral edema:  - On Coreg and ACEI, BIDIL, lasix. continue Lasix for negative balance as she is receiving TPN as well - Cardiac MRI  5/27b with Severe LVE with diffuse hypokinesis and abnormal septal  motion EF 26%; No hyperenhancement or scar tissue in LV myocardium - VQ scan neg for PE - Management per [facility] cardiology>>plan to arrange for life vest  Acute DVT right lower extremity  - Started on Lovenox by surgery 5/27 as hematuria cleared per patient. cardiology also recommends restarting Coumadin (unless plans of surgery this hospitalization). Cardiology does not recommend IVC filter. -coumadin restarted per primary today 5/28  Hematuria: Resolved per patient - On Rocephin for now, follow urine culture   Mild bilateral hydronephrosis: Renal function is normal  - Urology following, outpatient cystoscopy planned  Thrombocytosis: Likely reactive, stable.   prolapsed ostomy, s/p repair with recurrent non-ischemic prolapse  - Per [facility]  DVT Prophylaxis:  Code Status:  Disposition: per primary service.      Subjective: States no further Hematuria, decreased SOB and leg swelling  Objective: Weight change: 0 kg (0 lb)  Intake/Output Summary (Last 24 hours) at 02/17/13 0956 Last data filed at 02/17/13 0510  Gross per 24 hour  Intake   2130 ml  Output   2310 ml  Net   -180 ml   Blood pressure 114/76, pulse 72, temperature 97.9 F (36.6 C), temperature source Oral, resp. rate 16, height 5\' 4"  (1.626 m), weight 58.7 kg (129 lb 6.6 oz), SpO2 100.00%.  Physical Exam: General: No acute distress, pleasant and cooperative CVS: S1-S2 clear, Chest: CTAB Abdomen: soft normal bowel sounds Extremities: no cyanosis, clubbing.L >R LE edema   Lab Results: Basic  Metabolic Panel:  Recent Labs Lab 02/16/13 0530 02/17/13 0500  NA 139 141  K 2.8* 3.8  CL 103 104  CO2 28 29  GLUCOSE 82 93  BUN 32* 33*  CREATININE 1.00 1.07  CALCIUM 10.0 10.5  MG 1.6 1.8  PHOS 3.1 4.3   Liver Function Tests:  Recent Labs Lab 02/11/13 0525 02/15/13 0515  AST 15 37  ALT 8 28  ALKPHOS 78 70  BILITOT 0.1* 0.1*  PROT 6.0 6.3  ALBUMIN 2.3* 2.4*   No results found for this basename: LIPASE, AMYLASE,  in the last 168 hours No results found for this basename: AMMONIA,  in the last 168 hours CBC:  Recent Labs Lab 02/15/13 0515 02/17/13 0500  WBC 6.3 6.1  NEUTROABS 3.7 3.2  HGB 8.3* 8.2*  HCT 25.5* 25.5*  MCV 89.8 89.2  PLT 398 400   Cardiac Enzymes: No results found for this basename: CKTOTAL, CKMB, CKMBINDEX, TROPONINI,  in the last 168 hours BNP: No components found with this basename: POCBNP,  CBG:  Recent Labs Lab 02/10/13 1154 02/10/13 1814  GLUCAP 117* 98     Micro Results: Recent Results (from the past 240 hour(s))  URINE CULTURE     Status: None   Collection Time    02/07/13  4:59 PM      Result Value Range Status   Specimen Description URINE, CLEAN CATCH   Final   Special Requests NONE   Final   Culture  Setup Time 02/07/2013 22:10   Final   Colony Count >=100,000 COLONIES/ML   Final  Culture     Final   Value: Multiple bacterial morphotypes present, none predominant. Suggest appropriate recollection if clinically indicated.   Report Status 02/08/2013 FINAL   Final  URINE CULTURE     Status: None   Collection Time    02/14/13  8:14 AM      Result Value Range Status   Specimen Description URINE, RANDOM   Final   Special Requests NONE   Final   Culture  Setup Time 02/14/2013 19:29   Final   Colony Count >=100,000 COLONIES/ML   Final   Culture     Final   Value: Multiple bacterial morphotypes present, none predominant. Suggest appropriate recollection if clinically indicated.   Report Status 02/15/2013 FINAL   Final   URINE CULTURE     Status: None   Collection Time    02/15/13  2:00 PM      Result Value Range Status   Specimen Description URINE, RANDOM   Final   Special Requests NONE   Final   Culture  Setup Time 02/15/2013 14:38   Final   Colony Count 80,000 COLONIES/ML   Final   Culture     Final   Value: Multiple bacterial morphotypes present, none predominant. Suggest appropriate recollection if clinically indicated.   Report Status 02/16/2013 FINAL   Final    Studies/Results: Ct Abdomen Pelvis W Contrast  01/28/2013   *RADIOLOGY REPORT*  Clinical Data: Left lower quadrant abdominal pain with lower extremity and abdominal edema.  Left lower quadrant drain placed 7 months ago.  History of cervical cancer, radiation and chemotherapy, sigmoid colectomy with ostomy.  CT ABDOMEN AND PELVIS WITH CONTRAST  Technique:  Multidetector CT imaging of the abdomen and pelvis was performed following the standard protocol during bolus administration of intravenous contrast.  Contrast: 80mL OMNIPAQUE IOHEXOL 300 MG/ML  SOLN  Comparison: Abdominal pelvic CT 12/03/2012.  Barium enema 01/18/2013.  Findings: Images through the lung bases demonstrate stable cardiomegaly and linear scarring or atelectasis at both lung bases. There are trace bilateral pleural effusions.  There is mild ascites with diffuse soft tissue edema consistent with anasarca.  The abdomen is imaged prior to portal vein opacification.  There is some retrograde filling of the IVC and hepatic veins.  The hepatic steatosis has improved compared with the most recent study.  The previously noted low density lesions in the right and left hepatic lobes are grossly stable. The gallbladder is surgically absent. There is a stable nonobstructing calculus in the lower pole of the left kidney.  There is no hydronephrosis.  The right kidney, spleen, pancreas and adrenal glands appear normal.  Descending colostomy and left lower quadrant percutaneous drain appear unchanged.   Evaluation of the pelvic anatomy is limited by the anasarca and arterial phase imaging.  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 There is likely a small amount of residual extraluminal air and fluid in the left lower quadrant inferior and anterior to the surgical drain.  This appears grossly unchanged.  There is diffuse bladder wall thickening.  There is diffuse thickening of the wall of the rectosigmoid colon and perirectal soft tissue stranding. There is still some air within the vagina, but no contrast.  There are no worrisome osseous findings.  IMPRESSION:  1.  Progressive anasarca with diffuse soft tissue edema, ascites and trace  pleural effusions. 2.  Cardiomegaly with reflux of contrasted blood into the IVC and hepatic veins suggesting a degree of right heart failure. 3.  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  No large drainable collection identified.   Original Report Authenticated By: Carey Bullocks, M.D.   Dg Abd 2 Views  02/08/2013   *RADIOLOGY REPORT*  Clinical Data: Prolapsing colostomy and abdominal distention.  ABDOMEN - 2 VIEW  Comparison: 02/01/2013  Findings: There is a left lower quadrant colostomy.  Left lower quadrant pigtail drainage catheter is identified in the projection of the left iliac bone.  There is moderate gaseous distention of the transverse colon.  A few prominent small bowel loops are noted which measure up to 2.8 cm.  IMPRESSION:  1.  Moderate gaseous distention of the transverse colon. 2.  Pigtail drainage catheter projects over the left iliac bone and is presumably within the left iliac fossa as before.   Original Report Authenticated By: Signa Kell, M.D.    Dg Abd Acute W/chest  02/01/2013   *RADIOLOGY REPORT*  Clinical Data: Abdominal pain  ACUTE ABDOMEN SERIES (ABDOMEN 2 VIEW & CHEST 1 VIEW)  Comparison: 01/28/2013 CT  Findings: Cardiomegaly.  Central vascular congestion. No focal consolidation.  Surgical clips right upper quadrant.  No free intraperitoneal air. Surgical drain projects over the left lower quadrant.  There is air within normal caliber colon and a small amount of contrast within the left lower colon projecting just above the colostomy. There is also high attenuation over the colostomy, most in keeping with ingested contrast from the recent comparison. 2 small nonspecific metallic densities project over the mid pelvis. Gentle leftward curvature of the spine.  No acute osseous finding.  IMPRESSION: Surgical drain projects over the left lower abdomen/upper pelvis.  Nonspecific bowel gas pattern without overt obstruction.   Original Report Authenticated By: Jearld Lesch, M.D.   Dg Colon W/cm - Wo/w Kub  01/18/2013   *RADIOLOGY REPORT*  Clinical Data: No complex surgical history.  The patient has a cervical cancer radiation therapy the pelvis.  Subsequent a diverting colostomy and Hartmann's pouch.  Persistent drainage of stool through the Hartmann's pouch.  The patient additionally has a percutaneous drain in the pelvis.  The  SINGLE COLUMN BARIUM ENEMA  Technique:  Initial scout AP supine abdominal image obtained to insure adequate colon cleansing.  Barium was introduced into the colon in a retrograde fashion and refluxed from the rectum to the cecum. Spot images of the colon followed by overhead radiographs were obtained.  Fluoroscopy time: 2 minutes 29 seconds  Comparison:  CT 12/03/2012, and fistulogram, 08/25/2012, small bowel follow through the 09/20/2012  Findings:  A Foley catheter was inserted per rectum and 5 cc  gas insufflated into the retention balloon.  Under fluoroscopic observation are soluble contrast was administered via  gravity feed per rectum.  Contrast filled the Hartmann's pouch.  Contrast was quickly seen within the cecum.  Contrast was also quickly seen within the distal small bowel.  Contrast flowed antegrade into the ascending and transverse colon to the level of the splenic flexure. The is high density contrast within the terminal ileum.  No significant contrast flowed into the percutaneous drain.  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  In examining the combination of studies, favor the fistula  to occur between the sigmoid  colon pouch and the distal small bowel.  Findings discussed with Dr. [name] on 01/18/2013.   Original Report Authenticated By: Genevive Bi, M.D.    Medications: Scheduled Meds: . carvedilol  6.25 mg Oral BID WC  . cefTRIAXone (ROCEPHIN)  IV  1 g Intravenous Q24H  . enoxaparin (LOVENOX) injection  60 mg Subcutaneous Q12H  . feeding supplement  237 mL Oral BID BM  . furosemide  40 mg Oral BID  . isosorbide-hydrALAZINE  1 tablet Oral BID  . lisinopril  20 mg Oral Daily  . pantoprazole  40 mg Oral QHS  . potassium chloride  20 mEq Oral BID  . spironolactone  12.5 mg Oral Daily  . traMADol  50 mg Oral Q6H  . Warfarin - Pharmacist Dosing Inpatient   Does not apply q1800      LOS: 14 days   Kela Millin M.D. [facility] 02/17/2013, 9:56 AM Pager: [phone]  If 7PM-7AM, please contact night-coverage [email or web address] Password [name]

## 2013-02-17 NOTE — Progress Notes (Signed)
PARENTERAL NUTRITION CONSULT NOTE - Follow up  Pharmacy Consult for TPN Indication: complex GI history with pending fistula repair  Allergies  Allergen Reactions  . Penicillins Other (See Comments)    "anything w/penicillin in it makes me bleed"  . Labetalol Hcl Itching and Other (See Comments)    Bumps to bilateral arms.  . Morphine And Related Hives    Patient Measurements: Height: 5\' 4"  (162.6 cm) Weight: 129 lb 6.6 oz (58.7 kg) IBW/kg (Calculated) : 54.7  Vital Signs: Temp: 97.9 F (36.6 C) 2023-02-20 0505) Temp src: Oral 20-Feb-2023 0505) BP: 114/76 mmHg Feb 20, 2023 0505) Pulse Rate: 72 20-Feb-2023 0505) Intake/Output from previous day: 05/27 0701 - 02-20-23 0700 In: 2130 [P.O.:270; TPN:1860] Out: 2610 [Urine:2600; Drains:10] Intake/Output from this shift:    Labs:  Recent Labs  02/15/13 0515 02-19-13 0500  WBC 6.3 6.1  HGB 8.3* 8.2*  HCT 25.5* 25.5*  PLT 398 400  INR  --  1.28     Recent Labs  02/15/13 0515 02/16/13 0530 02-19-2013 0500  NA 141 139 141  K 3.6 2.8* 3.8  CL 104 103 104  CO2 26 28 29   GLUCOSE 79 82 93  BUN 35* 32* 33*  CREATININE 1.13* 1.00 1.07  CALCIUM 10.0 10.0 10.5  MG 1.9 1.6 1.8  PHOS 5.0* 3.1 4.3  PROT 6.3  --   --   ALBUMIN 2.4*  --   --   AST 37  --   --   ALT 28  --   --   ALKPHOS 70  --   --   BILITOT 0.1*  --   --   PREALBUMIN 18.3  --   --   TRIG 56  --   --    Estimated Creatinine Clearance: 62.2 ml/min (by C-G formula based on Cr of 1.07).   No results found for this basename: GLUCAP,  in the last 72 hours  Medical History: Past Medical History  Diagnosis Date  . History of blood transfusion     "I had ~ 7 in 01/2012"  . Anemia   . Cervical cancer ~ 2011    cervical  . Radiation Nov.18,2011-Jan.23,2012    cervix  . Hypertension     no treatment necessary for BP  since July 2013  . Cardiomyopathy 02/14/2013  . Non-ischemic cardiomyopathy, EF 25 % 02/14/2013  . At risk for sudden cardiac death 02-19-13    Insulin  Requirements in the past 24 hours:  None- SSI has been discontinued  Current Nutrition:  Regular diet, also receiving Ensure BID BM; Clinimix E 5/15:  25mL/hr x1 hour, then 171mL/hr over 10 hours, then 33mL/hr x1 hour for a total of over 12 hours. Provides 95g of protein/day and average of 1560kcal/day with fats given 3x week.   Assessment: Patient known to pharmacy from past TPN consults. She has a prolapsed ostomy which is s/p repair and is awaiting a fistula repair (scheduled for 5/27). Patient has extensive GI history. Was being worked up for possible obstruction but there does not appear to be one. Through conversation with RD and surgery PA, desire TPN at this point to ensure patient stays nutritionally stable prior to future procedures. Note plans to hold off on surgery indefinitely until nutritional status is improved  Nutritional Goals:  1800-1920 kCal, 80-95 grams of protein per day per RD recommendations 5/23  GI: s/p prolapsed colectomy revision, appears no obstruction, some BS and abd is soft but bloated, less nauseous. Having BM. PO  intake good- noted 100% meals consumed for a few days; PO PPI  Endo: no hx, has not required insulin in her TPN in the past, SSI has been discontinued  Lytes: K 3.8-  will need to watch closely as patient now on scheduled IV Lasix (also on lisinopril), phos 4.3, mag 1.8, corrected Ca 11.3 (Ca x Phos product ~50), other lytes nml. Noted patient has accumulated lytes in the past and may need lyte free formula tomorrow.  Renal: SCr 1, CrCl ~62mL/min; has half NS running at 10-90mL/hr; urology recommends cytoscopy and retrograde pyelograms in OR to fully finish evaluation for hematuria  Pulm: RA  Cards: seen by cards for cardiomyopathy (EF 5/10 20%; pro-BNP >2000 5/22) For now will be controlling BP and reducing afterload- VSS, on Lasix po BID, BiDil, lisinopril, and Lopressor; arranging for LifeVest; BNP 1234 5/28  Hepatobil: alkphos, AST, ALT  all nml, albumin low at 2.3, TBili low at 0.1; baseline prealbumin 12.1, recheck 11.7 (only 1 day after baseline check), 3rd check normal at 18.3; trigs 56.  Neuro: no issues  ID: WBC 6.3, afebrile; 5/18, 5/25 and 5/26 UCx- multiple bacteria Ceftriaxone 5/26>>  Heme: Lovenox 60q12 + warfarin for age indeterminate DVT found on doppler 5/22; was held d/t hematuria, but both restarted 5/27. Pt with blood tinged urine 5/28- per Dr. [name] will continue both warfarin and Lovenox  Best Practices: wafarin  TPN Access: PICC placed 5/19 TPN day#: started 5/19  Plan:  - will give KCl PO x2 - would recommend KCl PO regimen for patient as she will likely continue PO Lasix - Continue Clinimix E 5/15 with cycle- 1mL/hr x1 hour, then 138mL/hr over 10 hours, then 33mL/hr x1 hour for a total of over 12 hours - patient's nutritional status is improving based on improving prealbumin level. Will be difficult to limit TPN volume without compromising nutritional status - spoke with Dr. Herbie Baltimore regarding fluid status- as long as patient stays net negative, it will be beneficial for her. The sooner TPN can stop, the better for her fluid status and cardiac treatment. Is doing well on PO Lasix currently. - trace elements and IV Lipids MWF only due to ongoing shortages  - will give MVI daily PT - stop IVF at [facility] and saline lock per Dr. Herbie Baltimore - Routine TNA labs   Jaleeah Slight D. Festus Pursel, PharmD Clinical Pharmacist Pager: [phone] 02/17/2013 9:02 AM

## 2013-02-17 NOTE — Progress Notes (Addendum)
[facility]   Subjective:   Objective: Vital signs in last 24 hours: Temp:  [97.9 F (36.6 C)-99 F (37.2 C)] 97.9 F (36.6 C) (05/28 0505) Pulse Rate:  [72-87] 72 (05/28 0505) Resp:  [15-16] 16 (05/28 0505) BP: (114-130)/(76-84) 114/76 mmHg (05/28 0505) SpO2:  [100 %] 100 % (05/28 0505) Weight:  [129 lb 6.6 oz (58.7 kg)-147 lb 4.3 oz (66.8 kg)] 129 lb 6.6 oz (58.7 kg) (05/28 0505) Weight change: 0 lb (0 kg) Last BM Date: 02/16/13 Intake/Output from previous day: -480  (total from admit -7110) 05/27 0701 - 05/28 0700 In: 2130 [P.O.:270; TPN:1860] Out: 2610 [Urine:2600; Drains:10] Intake/Output this shift:    PE: Per Dr. Herbie Baltimore    Lab Results:  Recent Labs  02/15/13 0515 02/17/13 0500  WBC 6.3 6.1  HGB 8.3* 8.2*  HCT 25.5* 25.5*  PLT 398 400   BMET  Recent Labs  02/16/13 0530 02/17/13 0500  NA 139 141  K 2.8* 3.8  CL 103 104  CO2 28 29  GLUCOSE 82 93  BUN 32* 33*  CREATININE 1.00 1.07  CALCIUM 10.0 10.5   No results found for this basename: TROPONINI, CK, MB,  in the last 72 hours  Lab Results  Component Value Date   CHOL 64 02/09/2013   TRIG 56 02/15/2013   No results found for this basename: HGBA1C     No results found for this basename: TSH    Hepatic Function Panel  Recent Labs  02/15/13 0515  PROT 6.3  ALBUMIN 2.4*  AST 37  ALT 28  ALKPHOS 70  BILITOT 0.1*   No results found for this basename: CHOL,  in the last 72 hours No results found for this basename: PROTIME,  in the last 72 hours    EKG: Orders placed during the hospital encounter of 01/28/13  . ED EKG  . ED EKG  . EKG 12-LEAD  . EKG 12-LEAD  . EKG 12-LEAD  . EKG 12-LEAD  . EKG    Studies/Results: Mr Card Morphology Wo/w Cm  02/16/2013   Cardiac MRI:  Indication: Cardiomyopathy  Protocol:  The patient was scanned on a 1.5 Tesla GE magnet.  A dedicated cardiac coil was used.  Functional imaging was done using Fiesta sequences.  2,3  and 4 chamber views were done to assess RWMA;s.  Quantitative  EF was calculated using Circle software on a dedicated work station.  The patient received 30cc of Multihance. After 10 minutes inversion recovery sequences were done to assess for infarct or scar  Findings:  There was severe LVE with normal septal thickness. Diffuse hypokinesis with abnormal septal motion. Moderate LAE and mild RAE. Mild RV enlargement. No ASD or VSD No pericardial effusion. Both tricuspid and mitral valves showed regurgitation.  The aort was normal  Sinus: 3.3 cm STJ: 2.6 cm Ascneding aorta: 2.9 cm  Quantitative EF was 26% ( EDV 309cc, ESV 228cc SV 81 cc)  Delayed enhancement images were normal with no evidence of scar or infiltration.  There was no LAA thrombus.  The coronary origins were normal. The LM arose from the left cusp and the RCA from the right cusp  Impression:     1)    Severe LVE with diffuse hypokinesis and abnormal septal       motion EF 26%        2)    No hyperenhancement or scar tissue in LV myocardium 3)    Moderate LAE with  no LAA thrombus and mild RAE 4)    Mild RVE 5)    Tricuspid and mitral regurgitation see echo report for severity 6)    Normal aorta 7)    No pericardial effusion 8)    No anomalous coronary artery origins  Charlton Haws MD [facility]   Original Report Authenticated By: Charlton Haws, M.D.    Medications: I have reviewed the patient's current medications. Scheduled Meds: . carvedilol  6.25 mg Oral BID WC  . cefTRIAXone (ROCEPHIN)  IV  1 g Intravenous Q24H  . enoxaparin (LOVENOX) injection  60 mg Subcutaneous Q12H  . feeding supplement  237 mL Oral BID BM  . furosemide  40 mg Oral BID  . isosorbide-hydrALAZINE  1 tablet Oral BID  . lisinopril  20 mg Oral Daily  . pantoprazole  40 mg Oral QHS  . spironolactone  12.5 mg Oral Daily  . traMADol  50 mg Oral Q6H  . Warfarin - Pharmacist Dosing Inpatient   Does not apply q1800   Continuous Infusions: . sodium chloride 20 mL (02/17/13 0641)    PRN Meds:.hydrALAZINE, HYDROmorphone (DILAUDID) injection, ondansetron, oxyCODONE, promethazine, sodium chloride  Assessment/Plan: Principal Problem:   Colostomy prolapse, with revision  Active Problems:   Acute combined systolic and diastolic heart failure   DVT, femoral, acute   Non-ischemic cardiomyopathy, EF 25 %   Bilateral lower extremity edema   Post-radiation rectal bleeding   Entero-colonic fistula   Essential hypertension, benign   Thrombocytosis   Pulmonary HTN, severe   Anemia, secondary to blood loss   Hypokalemia   Gross hematuria   At risk for sudden cardiac death  PLAN:  Would stop Lovenox/heparin and let coumadin drift up.  Continue current meds -we will arrange life vest.   LOS: 14 days    [facility]  Nurse Practitioner Certified Pager [phone] 02/17/2013, 8:56 AM  I have seen and evaluated the patient this AM along with Nada Boozer, NP.  I agree with her findings, examination as well as impression recommendations.  Her CHF related volume overload seems to be resolving with only minimal LLE swelling (that may be DVT related) -- still hoping from Net out ~250-500 ml with PO diuretics while still on TPN. (would anticipate being able to reduce lasix dose once TPN has stopped) D/w pharmacist -- not able to concentrate TPN, but can Saline lock IV to avoid KVO rate of 20 ml/hr 1/2 NS / hr.  Pro BNP level have come down, but would not expect complete resolution. Cardiac MRI suggests Non-ischemic Dilated CM -- most likely etiology is HTN, but truly is idiopathic. - would probably not pursue invasive evaluation based upon these results.  Tolerating increases in BB & ACE-I along with Bidil & Spironolactone.    Agree with Warfarin for DVT, but would be reluctant to bridge due to recurrent hematuria -- agee with Dr. Erin Hearing recommendations.  With significantly reduced LVEF, she is at increased risk of sudden cardiac death - my colleagues have recommended  LifeVest for bridging coverage until probable AICD placement after ~3 month eval.  MD Time with pt: 15 min  HARDING,DAVID W, M.D., M.S. [facility] [address] [place], [place]  [address]  [phone] Pager # [phone] 02/17/2013 9:17 AM

## 2013-02-17 NOTE — Progress Notes (Signed)
ANTICOAGULATION CONSULT NOTE - Follow Up Consult  Pharmacy Consult:  Lovenox / Coumadin Indication:  RLE DVT  Allergies  Allergen Reactions  . Penicillins Other (See Comments)    "anything w/penicillin in it makes me bleed"  . Labetalol Hcl Itching and Other (See Comments)    Bumps to bilateral arms.  . Morphine And Related Hives    Patient Measurements: Height: 5\' 4"  (162.6 cm) Weight: 129 lb 6.6 oz (58.7 kg) IBW/kg (Calculated) : 54.7  Vital Signs: Temp: 97.9 F (36.6 C) (05/28 0505) Temp src: Oral (05/28 0505) BP: 125/87 mmHg (05/28 1041) Pulse Rate: 72 (05/28 0505)  Labs:  Recent Labs  02/15/13 0515 02/16/13 0530 02/17/13 0500  HGB 8.3*  --  8.2*  HCT 25.5*  --  25.5*  PLT 398  --  400  LABPROT  --   --  15.7*  INR  --   --  1.28  CREATININE 1.13* 1.00 1.07    Estimated Creatinine Clearance: 62.2 ml/min (by C-G formula based on Cr of 1.07).       Assessment: 37 YOF to continue on Lovenox and Coumadin (Overlap D#2/5) for RLE DVT.  Anticoagulations were held due to hematuria, now resumed and RN noted hematuria again.  Spoke to Surgery MD, okay to continue anticoagulation.  INR sub-therapeutic as expected.  Her renal function remains appropriate to continue Lovenox therapy.   Goal of Therapy:  INR 2 - 3 Anti-Xa level 0.6-1.2 units/ml 4hrs after LMWH dose given Monitor platelets by anticoagulation protocol: Yes    Plan:  - Coumadin 5mg  PO today - Lovenox 60mg  SQ Q12H - CBC Q72H while on Lovenox - Daily PT / INR - Monitor for worsening hematuria    Shelbi Vaccaro D. Laney Potash, PharmD, BCPS Pager:  [phone] 02/17/2013, 1:32 PM

## 2013-02-17 NOTE — Progress Notes (Signed)
[facility] Progress Note 14 Days Post-Op  Subjective: Patient feels good and is eating well.  Objective: Vital signs in last 24 hours: Temp:  [97.9 F (36.6 C)-99 F (37.2 C)] 97.9 F (36.6 C) (05/28 0505) Pulse Rate:  [72-87] 72 (05/28 0505) Resp:  [15-16] 16 (05/28 0505) BP: (114-130)/(76-84) 114/76 mmHg (05/28 0505) SpO2:  [100 %] 100 % (05/28 0505) Weight:  [58.7 kg (129 lb 6.6 oz)-66.8 kg (147 lb 4.3 oz)] 58.7 kg (129 lb 6.6 oz) (05/28 0505) Last BM Date: 02/16/13  Intake/Output from previous day: 05/27 0701 - 05/28 0700 In: 2130 [P.O.:270; TPN:1860] Out: 2610 [Urine:2600; Drains:10] Intake/Output this shift:    General: No acute distress  Lungs: Clear  Abd: Prolapsed colostomy, seems detached to the right and superiorly, but protruding.Peri Jefferson function, goo viability  Extremities: Left leg still swollen.  Restarted on Lovenox.  Neuro: Intact  Lab Results:  @LABLAST2 (wbc:2,hgb:2,hct:2,plt:2) BMET  Recent Labs  02/16/13 0530 02/17/13 0500  NA 139 141  K 2.8* 3.8  CL 103 104  CO2 28 29  GLUCOSE 82 93  BUN 32* 33*  CREATININE 1.00 1.07  CALCIUM 10.0 10.5   PT/INR  Recent Labs  02/17/13 0500  LABPROT 15.7*  INR 1.28   ABG No results found for this basename: PHART, PCO2, PO2, HCO3,  in the last 72 hours  Studies/Results: Mr Card Morphology Wo/w Cm  02/16/2013   Cardiac MRI:  Indication: Cardiomyopathy  Protocol:  The patient was scanned on a 1.5 Tesla GE magnet.  A dedicated cardiac coil was used.  Functional imaging was done using Fiesta sequences.  2,3 and 4 chamber views were done to assess RWMA;s.  Quantitative  EF was calculated using Circle software on a dedicated work station.  The patient received 30cc of Multihance. After 10 minutes inversion recovery sequences were done to assess for infarct or scar  Findings:  There was severe LVE with normal septal thickness. Diffuse hypokinesis with abnormal septal motion. Moderate LAE and mild RAE. Mild RV  enlargement. No ASD or VSD No pericardial effusion. Both tricuspid and mitral valves showed regurgitation.  The aort was normal  Sinus: 3.3 cm STJ: 2.6 cm Ascneding aorta: 2.9 cm  Quantitative EF was 26% ( EDV 309cc, ESV 228cc SV 81 cc)  Delayed enhancement images were normal with no evidence of scar or infiltration.  There was no LAA thrombus.  The coronary origins were normal. The LM arose from the left cusp and the RCA from the right cusp  Impression:     1)    Severe LVE with diffuse hypokinesis and abnormal septal       motion EF 26%        2)    No hyperenhancement or scar tissue in LV myocardium 3)    Moderate LAE with no LAA thrombus and mild RAE 4)    Mild RVE 5)    Tricuspid and mitral regurgitation see echo report for severity 6)    Normal aorta 7)    No pericardial effusion 8)    No anomalous coronary artery origins  Charlton Haws MD [facility]   Original Report Authenticated By: Charlton Haws, M.D.    Anti-infectives: Anti-infectives   Start     Dose/Rate Route Frequency Ordered Stop   02/15/13 1300  cefTRIAXone (ROCEPHIN) 1 g in dextrose 5 % 50 mL IVPB     1 g 100 mL/hr over 30 Minutes Intravenous Every 24 hours 02/15/13 1151     02/03/13 1315  ertapenem (INVANZ) 1 g in sodium chloride 0.9 % 50 mL IVPB  Status:  Discontinued     1 g 100 mL/hr over 30 Minutes Intravenous  Once 02/03/13 1312 02/03/13 1650   02/03/13 0800  ertapenem (INVANZ) 1 g in sodium chloride 0.9 % 50 mL IVPB  Status:  Discontinued     1 g 100 mL/hr over 30 Minutes Intravenous Every 24 hours 02/03/13 0759 02/03/13 1702      Assessment/Plan: s/p Procedure(s): COLOSTOMY REVISION Plan for discharge tomorrow Convert to coumadin per pharmacy. May be brideged as outpatient.  LOS: 14 days   Marta Lamas. [name], MD, FACS [phone] [facility] Surgery 02/17/2013

## 2013-02-18 ENCOUNTER — Telehealth: Payer: Self-pay | Admitting: Cardiology

## 2013-02-18 DIAGNOSIS — E46 Unspecified protein-calorie malnutrition: Secondary | ICD-10-CM

## 2013-02-18 LAB — COMPREHENSIVE METABOLIC PANEL
Albumin: 2.7 g/dL — ABNORMAL LOW (ref 3.5–5.2)
Alkaline Phosphatase: 67 U/L (ref 39–117)
BUN: 31 mg/dL — ABNORMAL HIGH (ref 6–23)
CO2: 26 mEq/L (ref 19–32)
Chloride: 104 mEq/L (ref 96–112)
GFR calc non Af Amer: 82 mL/min — ABNORMAL LOW (ref >90)
Glucose, Bld: 82 mg/dL (ref 70–99)
Potassium: 3.7 mEq/L (ref 3.5–5.1)
Total Bilirubin: 0.2 mg/dL — ABNORMAL LOW (ref 0.3–1.2)

## 2013-02-18 LAB — MAGNESIUM: Magnesium: 1.9 mg/dL (ref 1.5–2.5)

## 2013-02-18 MED ORDER — WARFARIN SODIUM 7.5 MG PO TABS
7.5000 mg | ORAL_TABLET | Freq: Once | ORAL | Status: AC
  Administered 2013-02-18: 7.5 mg via ORAL
  Filled 2013-02-18: qty 1

## 2013-02-18 MED ORDER — CLINIMIX E/DEXTROSE (5/15) 5 % IV SOLN
INTRAVENOUS | Status: AC
  Administered 2013-02-18: 17:00:00 via INTRAVENOUS
  Filled 2013-02-18: qty 2000

## 2013-02-18 MED ORDER — FUROSEMIDE 40 MG PO TABS
40.0000 mg | ORAL_TABLET | Freq: Every day | ORAL | Status: DC
  Administered 2013-02-19: 40 mg via ORAL
  Filled 2013-02-18: qty 1

## 2013-02-18 MED ORDER — POTASSIUM CHLORIDE CRYS ER 20 MEQ PO TBCR
20.0000 meq | EXTENDED_RELEASE_TABLET | Freq: Two times a day (BID) | ORAL | Status: AC
  Administered 2013-02-18 (×2): 20 meq via ORAL
  Filled 2013-02-18 (×3): qty 1

## 2013-02-18 NOTE — Progress Notes (Signed)
[facility]   Subjective: No complaints, still with bright blood in urine.  Mild dizziness at times.  Objective: Vital signs in last 24 hours: Temp:  [97.6 F (36.4 C)-97.9 F (36.6 C)] 97.6 F (36.4 C) (05/29 0534) Pulse Rate:  [79-92] 83 (05/29 0534) Resp:  [16-18] 18 (05/29 0534) BP: (123-155)/(80-105) 123/80 mmHg (05/29 0534) SpO2:  [98 %-100 %] 99 % (05/29 0534) Weight:  [123 lb 7.3 oz (56 kg)] 123 lb 7.3 oz (56 kg) (05/29 0534) Weight change: -23 lb 13 oz (-10.8 kg) Last BM Date: 02/17/13 Intake/Output from previous day:  -2500 (total -9610 since admit)  Wt 123.7 LBS down from 147 at pk weight 05/28 0701 - 05/29 0700 In: -  Out: 2500 [Urine:1850; Emesis/NG output:650] Intake/Output this shift:    PE: General:Pleasant affect, NAD Skin:Warm and dry, brisk capillary refill HEENT:normocephalic, sclera clear, mucus membranes moist Neck:supple, + JVD 1 cm, no bruits  Heart:S1S2 RRR without murmur, gallup, rub or click Lungs:few crackles bilaterally no rhonchi, or wheezes ZOX:WRUE, non tender, + BS, do not palpate liver spleen or masses Ext:no lower ext edema, 2+ pedal pulses, 2+ radial pulses Neuro:alert and oriented, MAE, follows commands, + facial symmetry  TELE:  SR occ ST to 110 and occ paired PVCs.      Lab Results:  Recent Labs  02/17/13 0500  WBC 6.1  HGB 8.2*  HCT 25.5*  PLT 400   BMET  Recent Labs  02/17/13 0500 02/18/13 0540  NA 141 140  K 3.8 3.7  CL 104 104  CO2 29 26  GLUCOSE 93 82  BUN 33* 31*  CREATININE 1.07 0.89  CALCIUM 10.5 10.3   No results found for this basename: TROPONINI, CK, MB,  in the last 72 hours  Lab Results  Component Value Date   CHOL 64 02/09/2013   TRIG 56 02/15/2013   No results found for this basename: HGBA1C     No results found for this basename: TSH    Hepatic Function Panel  Recent Labs  02/18/13 0540  PROT 6.7  ALBUMIN 2.7*  AST 23  ALT 25  ALKPHOS 67  BILITOT  0.2*   BNP (last 3 results)  Recent Labs  02/11/13 1815 02/17/13 0500  PROBNP 2330.0* 1234.0*   Studies/Results: Mr Card Morphology Wo/w Cm  02/16/2013   Cardiac MRI:  Indication: Cardiomyopathy  Protocol:  The patient was scanned on a 1.5 Tesla GE magnet.  A dedicated cardiac coil was used.  Functional imaging was done using Fiesta sequences.  2,3 and 4 chamber views were done to assess RWMA;s.  Quantitative  EF was calculated using Circle software on a dedicated work station.  The patient received 30cc of Multihance. After 10 minutes inversion recovery sequences were done to assess for infarct or scar  Findings:  There was severe LVE with normal septal thickness. Diffuse hypokinesis with abnormal septal motion. Moderate LAE and mild RAE. Mild RV enlargement. No ASD or VSD No pericardial effusion. Both tricuspid and mitral valves showed regurgitation.  The aort was normal  Sinus: 3.3 cm STJ: 2.6 cm Ascneding aorta: 2.9 cm  Quantitative EF was 26% ( EDV 309cc, ESV 228cc SV 81 cc)  Delayed enhancement images were normal with no evidence of scar or infiltration.  There was no LAA thrombus.  The coronary origins were normal. The LM arose from the left cusp and the RCA from the right cusp  Impression:     1)  Severe LVE with diffuse hypokinesis and abnormal septal       motion EF 26%        2)    No hyperenhancement or scar tissue in LV myocardium 3)    Moderate LAE with no LAA thrombus and mild RAE 4)    Mild RVE 5)    Tricuspid and mitral regurgitation see echo report for severity 6)    Normal aorta 7)    No pericardial effusion 8)    No anomalous coronary artery origins  Charlton Haws MD [facility]   Original Report Authenticated By: Charlton Haws, M.D.    Medications: I have reviewed the patient's current medications. . carvedilol  6.25 mg Oral BID WC  . cefTRIAXone (ROCEPHIN)  IV  1 g Intravenous Q24H  . enoxaparin (LOVENOX) injection  60 mg Subcutaneous Q12H  . feeding supplement  237 mL Oral BID BM   . furosemide  40 mg Oral BID  . isosorbide-hydrALAZINE  1 tablet Oral BID  . lisinopril  20 mg Oral Daily  . pantoprazole  40 mg Oral QHS  . spironolactone  12.5 mg Oral Daily  . traMADol  50 mg Oral Q6H  . Warfarin - Pharmacist Dosing Inpatient   Does not apply q1800   Assessment/Plan: Principal Problem:   Colostomy prolapse, with revision  Active Problems:   Acute combined systolic and diastolic heart failure   DVT, femoral, acute   Non-ischemic cardiomyopathy, EF 25 %   Bilateral lower extremity edema   Post-radiation rectal bleeding   Entero-colonic fistula   Essential hypertension, benign   Thrombocytosis   Pulmonary HTN, severe   Anemia, secondary to blood loss   Hypokalemia   Gross hematuria   At risk for sudden cardiac death, now with life vest  PLAN: Pro BNP decreased now 1234.  H/H low but stable. INR sub therapeutic, on Lovenox.   VS stable.   Lifevest has been fitted and in room. Pt states she understands how to wear.  Possible discharge today per surgical note.  Will need close follow up.  I discussed low sodium diets and if weight increased by  3 pounds in a day or 5 pounds in a week to call us.    Will continue to titrate meds as outpatient then repeat echo in 3 months. To be scheduled after first office visit.    LOS: 15 days   Time spent with pt. :15 minutes. [facility]  Nurse Practitioner Certified Pager [phone] 02/18/2013, 8:13 AM

## 2013-02-18 NOTE — Progress Notes (Signed)
ANTICOAGULATION CONSULT NOTE - Follow Up Consult  Pharmacy Consult:  Lovenox / Coumadin Indication:  RLE DVT  Allergies  Allergen Reactions  . Penicillins Other (See Comments)    "anything w/penicillin in it makes me bleed"  . Labetalol Hcl Itching and Other (See Comments)    Bumps to bilateral arms.  . Morphine And Related Hives    Patient Measurements: Height: 5\' 4"  (162.6 cm) Weight: 123 lb 7.3 oz (56 kg) IBW/kg (Calculated) : 54.7  Vital Signs: Temp: 97.6 F (36.4 C) (05/29 0534) Temp src: Oral (05/29 0534) BP: 123/80 mmHg (05/29 0534) Pulse Rate: 83 (05/29 0534)  Labs:  Recent Labs  02/16/13 0530 02/17/13 0500 02/18/13 0540  HGB  --  8.2*  --   HCT  --  25.5*  --   PLT  --  400  --   LABPROT  --  15.7* 15.3*  INR  --  1.28 1.23  CREATININE 1.00 1.07 0.89    Estimated Creatinine Clearance: 74.7 ml/min (by C-G formula based on Cr of 0.89).       Assessment: 37 YOF to continue on Lovenox and Coumadin (Overlap D#3/5) for RLE DVT.  Anticoagulations were held due to hematuria, now resumed per Surgery.  INR remains sub-therapeutic.  Patient reports hematuria has worsened - aware of Cardiology's recommendations.  Noted plan to discharge patient.   Goal of Therapy:  INR 2 - 3 Anti-Xa level 0.6-1.2 units/ml 4hrs after LMWH dose given Monitor platelets by anticoagulation protocol: Yes    Plan:  - Coumadin 7.5mg  PO today (recommend discharging patient home on 5mg  tablet, taking 7.5mg  PO daily at 1800 until the next INR check, preferably in the next 2-3 days). - It is acceptable to omit bridging and still meet VTE core measure as Cardiology has clearly documented reason for not bridging.   - Continue Lovenox 60mg  SQ Q12H until specified - CBC Q72H while on Lovenox - Daily PT / INR - Monitor for continued bleeding, MD plans - FYI:  patient states she does not eat much and the amount of food she consumes in-hospital is similar to her diet outside.     Mckinsey Keagle  D. Laney Potash, PharmD, BCPS Pager:  [phone] 02/18/2013, 9:39 AM

## 2013-02-18 NOTE — Progress Notes (Signed)
I have seen and examined the pt and agree with PA-Dort's progress note.  

## 2013-02-18 NOTE — Progress Notes (Signed)
PARENTERAL NUTRITION CONSULT NOTE - Follow up  Pharmacy Consult for TPN Indication: complex GI history with pending fistula repair  Allergies  Allergen Reactions  . Penicillins Other (See Comments)    "anything w/penicillin in it makes me bleed"  . Labetalol Hcl Itching and Other (See Comments)    Bumps to bilateral arms.  . Morphine And Related Hives    Patient Measurements: Height: 5\' 4"  (162.6 cm) Weight: 123 lb 7.3 oz (56 kg) IBW/kg (Calculated) : 54.7  Vital Signs: Temp: 97.6 F (36.4 C) (05/29 0534) Temp src: Oral (05/29 0534) BP: 123/80 mmHg (05/29 0534) Pulse Rate: 83 (05/29 0534) Intake/Output from previous day: 03-Mar-2023 0701 - 05/29 0700 In: -  Out: 2500 [Urine:1850; Emesis/NG output:650] Intake/Output from this shift:    Labs:  Recent Labs  03-02-2013 0500 02/18/13 0540  WBC 6.1  --   HGB 8.2*  --   HCT 25.5*  --   PLT 400  --   INR 1.28 1.23     Recent Labs  02/16/13 0530 2013/03/02 0500 02/18/13 0540  NA 139 141 140  K 2.8* 3.8 3.7  CL 103 104 104  CO2 28 29 26   GLUCOSE 82 93 82  BUN 32* 33* 31*  CREATININE 1.00 1.07 0.89  CALCIUM 10.0 10.5 10.3  MG 1.6 1.8 1.9  PHOS 3.1 4.3 3.9  PROT  --   --  6.7  ALBUMIN  --   --  2.7*  AST  --   --  23  ALT  --   --  25  ALKPHOS  --   --  67  BILITOT  --   --  0.2*   Estimated Creatinine Clearance: 74.7 ml/min (by C-G formula based on Cr of 0.89).   No results found for this basename: GLUCAP,  in the last 72 hours  Medical History: Past Medical History  Diagnosis Date  . History of blood transfusion     "I had ~ 7 in 01/2012"  . Anemia   . Cervical cancer ~ 2011    cervical  . Radiation Nov.18,2011-Jan.23,2012    cervix  . Hypertension     no treatment necessary for BP  since July 2013  . Cardiomyopathy 02/14/2013  . Non-ischemic cardiomyopathy, EF 25 % 02/14/2013  . At risk for sudden cardiac death 03/02/2013    Insulin Requirements in the past 24 hours:  None- SSI has been  discontinued  Current Nutrition:  Regular diet, also receiving Ensure BID BM; Clinimix E 5/15:  42mL/hr x1 hour, then 123mL/hr over 10 hours, then 24mL/hr x1 hour for a total of over 12 hours. Provides 95g of protein/day and average of 1560kcal/day with fats given 3x week.   Assessment: Patient known to pharmacy from past TPN consults. She has a prolapsed ostomy which is s/p repair and is awaiting a fistula repair (scheduled for 5/27). Patient has extensive GI history. Was being worked up for possible obstruction but there does not appear to be one. Through conversation with RD and surgery PA, desire TPN at this point to ensure patient stays nutritionally stable prior to future procedures. Note plans to hold off on surgery indefinitely until nutritional status is improved. Per surgery, planning for discharge today.  Nutritional Goals:  1800-1920 kCal, 80-95 grams of protein per day per RD recommendations 5/23  GI: s/p prolapsed colectomy revision, appears no obstruction, some BS and abd is soft but bloated, less nauseous. Having BM. PO intake good- noted 100% meals consumed  for a few days; PO PPI  Endo: no hx, has not required insulin in her TPN in the past, SSI has been discontinued  Lytes: K 3.7-  will need to watch closely as patient now on scheduled po Lasix (also on lisinopril and spiro), phos 3.9, mag 1.9, corrected Ca 11.3 (Ca x Phos product ~44), other lytes nml. Noted patient has accumulated lytes in the past and has required lyte free formula  Renal: SCr 0.89, CrCl ~53mL/min; urology recommends cytoscopy and retrograde pyelograms in OR to fully finish evaluation for hematuria  Pulm: RA  Cards: seen by cards for cardiomyopathy (EF 5/10 20%, BNP 1234 5/28) For now will be controlling BP and reducing afterload- VSS, on Lasix po daily, BiDil, lisinopril, and Lopressor; has LifeVest; spoke with Dr. Herbie Baltimore 5/28 regarding fluid status- as long as patient stays net negative, it will be  beneficial for her. The sooner TPN can stop, the better for her fluid status and cardiac treatment. Is doing well on PO Lasix currently (down 24lbs from peak weight of 147)  Hepatobil: alkphos, AST, ALT all nml, albumin low at 2.7, TBili low at 0.2 (improving); baseline prealbumin 12.1, recheck 11.7 (only 1 day after baseline check), 3rd check normal at 18.3; trigs 56.  Neuro: no issues  ID: WBC 6.1, afebrile; 5/18, 5/25 and 5/26 UCx- multiple bacteria Ceftriaxone 5/26>>  Heme: Lovenox 60q12 + warfarin for age indeterminate DVT found on doppler 5/22; was held d/t hematuria, but both restarted 5/27. Pt still having hematuria.  Best Practices: wafarin + Lovenox  TPN Access: PICC placed 5/19 TPN day#: started 5/19  Plan:  - will give KCl PO x2 - would recommend KCl daily if patient continues on 40mg  daily of Lasix and TPN - Continue Clinimix E 5/15 with cycle- 30mL/hr x1 hour, then 159mL/hr over 10 hours, then 94mL/hr x1 hour for a total of over 12 hours - patient's nutritional status is improving based on improving prealbumin level. Will be difficult to limit TPN volume without compromising nutritional status - trace elements and IV Lipids MWF only due to ongoing shortages  - will give MVI daily PT - BMET, phos in the morning if patient is not discharged - would recommend stopping ceftriaxone - f/u for discharge plans   Damyen Knoll D. Roschelle Calandra, PharmD Clinical Pharmacist Pager: [phone] 02/18/2013 9:23 AM

## 2013-02-18 NOTE — Progress Notes (Signed)
Pt. Seen and examined. Agree with the NP/PA-C note as written.  She is doing much better. She can lie flat. Still has some dark blood in the urine, but no clots. Her DVT looks old. Given her hematuria, I would recommend warfarin without a lovenox bridge, as I have previously stated. 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 cMRI was helpful to r/o significant CAD - there is no evidence of infiltrative cardiomyopathy or genetic hypertrophy. She has been fitted with lifevest and understands it importance.  We would like to see her back in the office for early follow-up (TCM 7) in 5-7 days due to complex CHF.  She will need a repeat echo in 3-6 months to evaluate for interval improvement in LVEF - if no improvement, she will be a candidate for AICD for primary prevention of sudden cardiac death.   Discharge CHF medications: Lasix 40 mg po daily BIDIL 20/37.5 mg BID Lisinopril 20 mg daily Carvedilol 6.25 mg daily Aldactone 12.5 mg daily Warfarin (without lovenox bridge) - INR range 2.0-3.0 for DVT  Chrystie Nose, MD, [facility] Attending Cardiologist [facility]

## 2013-02-18 NOTE — Telephone Encounter (Signed)
TCM phone call.

## 2013-02-18 NOTE — Progress Notes (Signed)
15 Days Post-Op  Subjective: Pt is feeling pretty well, pain better controlled today.  No N/V.  Having gas and output in ostomy bag.  Ambulating well and wanting to go home.  Tolerating diet well, but still lower appetite and low intake.  She at a bagel for breakfast and about 75% of her lunch according to the nurse.    Objective: Vital signs in last 24 hours: Temp:  [97.6 F (36.4 C)-97.9 F (36.6 C)] 97.6 F (36.4 C) (05/29 0534) Pulse Rate:  [79-92] 83 (05/29 0534) Resp:  [16-18] 18 (05/29 0534) BP: (123-155)/(80-105) 123/80 mmHg (05/29 0534) SpO2:  [98 %-100 %] 99 % (05/29 0534) Weight:  [123 lb 7.3 oz (56 kg)] 123 lb 7.3 oz (56 kg) (05/29 0534) Last BM Date: 02/17/13  Intake/Output from previous day: 05/28 0701 - 05/29 0700 In: -  Out: 2500 [Urine:1850; Emesis/NG output:650] Intake/Output this shift:    PE: Gen:  Alert, NAD, pleasant Abd: Soft, NT/ND, +BS,  Brown ostomy output with gas, ostomy patent and pink, viable but protruding ostomy Left leg:  Still swollen   Lab Results:   Recent Labs  02/17/13 0500  WBC 6.1  HGB 8.2*  HCT 25.5*  PLT 400   BMET  Recent Labs  02/17/13 0500 02/18/13 0540  NA 141 140  K 3.8 3.7  CL 104 104  CO2 29 26  GLUCOSE 93 82  BUN 33* 31*  CREATININE 1.07 0.89  CALCIUM 10.5 10.3   PT/INR  Recent Labs  02/17/13 0500 02/18/13 0540  LABPROT 15.7* 15.3*  INR 1.28 1.23   CMP     Component Value Date/Time   NA 140 02/18/2013 0540   K 3.7 02/18/2013 0540   CL 104 02/18/2013 0540   CO2 26 02/18/2013 0540   GLUCOSE 82 02/18/2013 0540   BUN 31* 02/18/2013 0540   CREATININE 0.89 02/18/2013 0540   CALCIUM 10.3 02/18/2013 0540   CALCIUM 10.7* 02/01/2012 0705   PROT 6.7 02/18/2013 0540   ALBUMIN 2.7* 02/18/2013 0540   AST 23 02/18/2013 0540   ALT 25 02/18/2013 0540   ALKPHOS 67 02/18/2013 0540   BILITOT 0.2* 02/18/2013 0540   GFRNONAA 82* 02/18/2013 0540   GFRAA >90 02/18/2013 0540   Lipase     Component Value Date/Time   LIPASE  8* 02/01/2013 0210       Studies/Results: Mr Card Morphology Wo/w Cm  02/16/2013   Cardiac MRI:  Indication: Cardiomyopathy  Protocol:  The patient was scanned on a 1.5 Tesla GE magnet.  A dedicated cardiac coil was used.  Functional imaging was done using Fiesta sequences.  2,3 and 4 chamber views were done to assess RWMA;s.  Quantitative  EF was calculated using Circle software on a dedicated work station.  The patient received 30cc of Multihance. After 10 minutes inversion recovery sequences were done to assess for infarct or scar  Findings:  There was severe LVE with normal septal thickness. Diffuse hypokinesis with abnormal septal motion. Moderate LAE and mild RAE. Mild RV enlargement. No ASD or VSD No pericardial effusion. Both tricuspid and mitral valves showed regurgitation.  The aort was normal  Sinus: 3.3 cm STJ: 2.6 cm Ascneding aorta: 2.9 cm  Quantitative EF was 26% ( EDV 309cc, ESV 228cc SV 81 cc)  Delayed enhancement images were normal with no evidence of scar or infiltration.  There was no LAA thrombus.  The coronary origins were normal. The LM arose from the left cusp and the RCA from  the right cusp  Impression:     1)    Severe LVE with diffuse hypokinesis and abnormal septal       motion EF 26%        2)    No hyperenhancement or scar tissue in LV myocardium 3)    Moderate LAE with no LAA thrombus and mild RAE 4)    Mild RVE 5)    Tricuspid and mitral regurgitation see echo report for severity 6)    Normal aorta 7)    No pericardial effusion 8)    No anomalous coronary artery origins  Charlton Haws MD [facility]   Original Report Authenticated By: Charlton Haws, M.D.    Anti-infectives: Anti-infectives   Start     Dose/Rate Route Frequency Ordered Stop   02/15/13 1300  cefTRIAXone (ROCEPHIN) 1 g in dextrose 5 % 50 mL IVPB     1 g 100 mL/hr over 30 Minutes Intravenous Every 24 hours 02/15/13 1151     02/03/13 1315  ertapenem (INVANZ) 1 g in sodium chloride 0.9 % 50 mL IVPB  Status:   Discontinued     1 g 100 mL/hr over 30 Minutes Intravenous  Once 02/03/13 1312 02/03/13 1650   02/03/13 0800  ertapenem (INVANZ) 1 g in sodium chloride 0.9 % 50 mL IVPB  Status:  Discontinued     1 g 100 mL/hr over 30 Minutes Intravenous Every 24 hours 02/03/13 0759 02/03/13 1702       Assessment/Plan 1. Prolapsed ostomy, s/p repair with recurrent non-ischemic prolapse  2. Enterocolonic fistula, needs repair 3. Hypoalbuminemia  4. Bilateral lower extremity edema, now secondary to # 13, 14  5. Hematuria secondary to #15 6. HTN  7. H/o anemia secondary to ABL from severe thrombocytopenia from sepsis, still with anemia  8. H/o cervical cancer, s/p hysterectomy and radiation  9. PCM/TNA-prealbumin 11.7 10. Bilateral hydronephrosis  11. New diagnosed cardiomyopathy with EF of 20% with global hypokinesis  12. Pulmonary hypertension  13. Right-sided femoral DVT  Plan:   1.  Will plan on discharge tomorrow morning with HH TPN therapy 2.  Discussed with Dr. [name] who would like her TPN continued upon discharge.  She will not need antibiotics. 3.  D/C'ed lovenox and continue with coumadin.  After reading recommendations from Dr. Rennis Golden:  WILL D/C LOVENOX secondary to hematuria, and continue with coumadin without Lovenox bridge 4.  Patient is establishing care with a PCP who also has a coumadin clinic.  She has an appt at 2:00pm on Monday 02/22/13 5.  She will need f/u with Cardiology and Dr. [name] after discharge - has appts for both 02/23/13   LOS: 15 days    DORT, [facility] 02/18/2013, 10:33 AM Pager: [phone]

## 2013-02-18 NOTE — Progress Notes (Addendum)
Patient ID: Vanessa [name]  female  ZOX:[id number]    DOB: 10-27-1975    DOA: 02/03/2013  PCP: Dorrene German, MD  Assessment/Plan: Hypertension: BP stable  Cardiomyopathy with EF of 20%, global hypokinesis and Peripheral edema:  - On Coreg and ACEI, BIDIL, lasix. continue Lasix for negative balance as she is receiving TPN as well - Cardiac MRI  5/27b with Severe LVE with diffuse hypokinesis and abnormal septal  motion EF 26%; No hyperenhancement or scar tissue in LV myocardium - VQ scan neg for PE - Management per [facility] cardiology>>plan to arrange for life vest  Acute DVT right lower extremity  - Started on Lovenox by surgery 5/27 as hematuria cleared per patient. cardiology also recommends restarting Coumadin (unless plans of surgery this hospitalization). Cardiology does not recommend IVC filter. -coumadin restarted per primary today 5/28 -agree with not bridging lovenox/coumadin upon d/c secondary to hematuria  Hematuria: Resolved per patient - On Rocephin for now, follow urine culture   Mild bilateral hydronephrosis: Renal function is normal  - Pt is to follow up with Dr Gearldine Shown outpt upon d/c>> outpt cysto planned  Thrombocytosis: Likely reactive, stable.   prolapsed ostomy, s/p repair with recurrent non-ischemic prolapse  - Per [facility]  DVT Prophylaxis:  Code Status:  Disposition: per primary service.      Subjective: States no further Hematuria, denies n/v today  Objective: Weight change: -10.8 kg (-23 lb 13 oz)  Intake/Output Summary (Last 24 hours) at 02/18/13 1607 Last data filed at 02/18/13 1500  Gross per 24 hour  Intake   1120 ml  Output   1400 ml  Net   -280 ml   Blood pressure 112/76, pulse 81, temperature 98.2 F (36.8 C), temperature source Oral, resp. rate 17, height 5\' 4"  (1.626 m), weight 56 kg (123 lb 7.3 oz), SpO2 100.00%.  Physical Exam: General: No acute distress, pleasant and cooperative CVS: S1-S2 clear, Chest: CTAB Abdomen: soft  normal bowel sounds Extremities: no cyanosis, clubbing.L >R LE edema   Lab Results: Basic Metabolic Panel:  Recent Labs Lab 02/17/13 0500 02/18/13 0540  NA 141 140  K 3.8 3.7  CL 104 104  CO2 29 26  GLUCOSE 93 82  BUN 33* 31*  CREATININE 1.07 0.89  CALCIUM 10.5 10.3  MG 1.8 1.9  PHOS 4.3 3.9   Liver Function Tests:  Recent Labs Lab 02/15/13 0515 02/18/13 0540  AST 37 23  ALT 28 25  ALKPHOS 70 67  BILITOT 0.1* 0.2*  PROT 6.3 6.7  ALBUMIN 2.4* 2.7*   No results found for this basename: LIPASE, AMYLASE,  in the last 168 hours No results found for this basename: AMMONIA,  in the last 168 hours CBC:  Recent Labs Lab 02/15/13 0515 02/17/13 0500  WBC 6.3 6.1  NEUTROABS 3.7 3.2  HGB 8.3* 8.2*  HCT 25.5* 25.5*  MCV 89.8 89.2  PLT 398 400   Cardiac Enzymes: No results found for this basename: CKTOTAL, CKMB, CKMBINDEX, TROPONINI,  in the last 168 hours BNP: No components found with this basename: POCBNP,  CBG: No results found for this basename: GLUCAP,  in the last 168 hours   Micro Results: Recent Results (from the past 240 hour(s))  URINE CULTURE     Status: None   Collection Time    02/14/13  8:14 AM      Result Value Range Status   Specimen Description URINE, RANDOM   Final   Special Requests NONE   Final   Culture  Setup Time 02/14/2013 19:29   Final   Colony Count >=100,000 COLONIES/ML   Final   Culture     Final   Value: Multiple bacterial morphotypes present, none predominant. Suggest appropriate recollection if clinically indicated.   Report Status 02/15/2013 FINAL   Final  URINE CULTURE     Status: None   Collection Time    02/15/13  2:00 PM      Result Value Range Status   Specimen Description URINE, RANDOM   Final   Special Requests NONE   Final   Culture  Setup Time 02/15/2013 14:38   Final   Colony Count 80,000 COLONIES/ML   Final   Culture     Final   Value: Multiple bacterial morphotypes present, none predominant. Suggest  appropriate recollection if clinically indicated.   Report Status 02/16/2013 FINAL   Final    Studies/Results: Ct Abdomen Pelvis W Contrast  01/28/2013   *RADIOLOGY REPORT*  Clinical Data: Left lower quadrant abdominal pain with lower extremity and abdominal edema.  Left lower quadrant drain placed 7 months ago.  History of cervical cancer, radiation and chemotherapy, sigmoid colectomy with ostomy.  CT ABDOMEN AND PELVIS WITH CONTRAST  Technique:  Multidetector CT imaging of the abdomen and pelvis was performed following the standard protocol during bolus administration of intravenous contrast.  Contrast: 80mL OMNIPAQUE IOHEXOL 300 MG/ML  SOLN  Comparison: Abdominal pelvic CT 12/03/2012.  Barium enema 01/18/2013.  Findings: Images through the lung bases demonstrate stable cardiomegaly and linear scarring or atelectasis at both lung bases. There are trace bilateral pleural effusions.  There is mild ascites with diffuse soft tissue edema consistent with anasarca.  The abdomen is imaged prior to portal vein opacification.  There is some retrograde filling of the IVC and hepatic veins.  The hepatic steatosis has improved compared with the most recent study.  The previously noted low density lesions in the right and left hepatic lobes are grossly stable. The gallbladder is surgically absent. There is a stable nonobstructing calculus in the lower pole of the left kidney.  There is no hydronephrosis.  The right kidney, spleen, pancreas and adrenal glands appear normal.  Descending colostomy and left lower quadrant percutaneous drain appear unchanged.  Evaluation of the pelvic anatomy is limited by the anasarca and arterial phase imaging.  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 There is likely a small amount of residual extraluminal air  and fluid in the left lower quadrant inferior and anterior to the surgical drain.  This appears grossly unchanged.  There is diffuse bladder wall thickening.  There is diffuse thickening of the wall of the rectosigmoid colon and perirectal soft tissue stranding. There is still some air within the vagina, but no contrast.  There are no worrisome osseous findings.  IMPRESSION:  1.  Progressive anasarca with diffuse soft tissue edema, ascites and trace pleural effusions. 2.  Cardiomegaly with reflux of contrasted blood into the IVC and hepatic veins suggesting a degree of right heart failure. 3.  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  No large drainable collection identified.   Original Report Authenticated By: Carey Bullocks, M.D.   Dg Abd 2 Views  02/08/2013   *RADIOLOGY REPORT*  Clinical Data: Prolapsing colostomy  and abdominal distention.  ABDOMEN - 2 VIEW  Comparison: 02/01/2013  Findings: There is a left lower quadrant colostomy.  Left lower quadrant pigtail drainage catheter is identified in the projection of the left iliac bone.  There is moderate gaseous distention of the transverse colon.  A few prominent small bowel loops are noted which measure up to 2.8 cm.  IMPRESSION:  1.  Moderate gaseous distention of the transverse colon. 2.  Pigtail drainage catheter projects over the left iliac bone and is presumably within the left iliac fossa as before.   Original Report Authenticated By: Signa Kell, M.D.   Dg Abd Acute W/chest  02/01/2013   *RADIOLOGY REPORT*  Clinical Data: Abdominal pain  ACUTE ABDOMEN SERIES (ABDOMEN 2 VIEW & CHEST 1 VIEW)  Comparison: 01/28/2013 CT  Findings: Cardiomegaly.  Central vascular congestion. No focal consolidation.  Surgical clips right upper quadrant.  No free intraperitoneal air. Surgical drain projects over the left lower quadrant.   There is air within normal caliber colon and a small amount of contrast within the left lower colon projecting just above the colostomy. There is also high attenuation over the colostomy, most in keeping with ingested contrast from the recent comparison. 2 small nonspecific metallic densities project over the mid pelvis. Gentle leftward curvature of the spine.  No acute osseous finding.  IMPRESSION: Surgical drain projects over the left lower abdomen/upper pelvis.  Nonspecific bowel gas pattern without overt obstruction.   Original Report Authenticated By: Jearld Lesch, M.D.   Dg Colon W/cm - Wo/w Kub  01/18/2013   *RADIOLOGY REPORT*  Clinical Data: No complex surgical history.  The patient has a cervical cancer radiation therapy the pelvis.  Subsequent a diverting colostomy and Hartmann's pouch.  Persistent drainage of stool through the Hartmann's pouch.  The patient additionally has a percutaneous drain in the pelvis.  The  SINGLE COLUMN BARIUM ENEMA  Technique:  Initial scout AP supine abdominal image obtained to insure adequate colon cleansing.  Barium was introduced into the colon in a retrograde fashion and refluxed from the rectum to the cecum. Spot images of the colon followed by overhead radiographs were obtained.  Fluoroscopy time: 2 minutes 29 seconds  Comparison:  CT 12/03/2012, and fistulogram, 08/25/2012, small bowel follow through the 09/20/2012  Findings:  A Foley catheter was inserted per rectum and 5 cc  gas insufflated into the retention balloon.  Under fluoroscopic observation are soluble contrast was administered via gravity feed per rectum.  Contrast filled the Hartmann's pouch.  Contrast was quickly seen within the cecum.  Contrast was also quickly seen within the distal small bowel.  Contrast flowed antegrade into the ascending and transverse colon to the level of the splenic flexure. The is high density contrast within the terminal ileum.  No significant contrast flowed into the  percutaneous drain.  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  Findings discussed with Dr. [name] on 01/18/2013.   Original Report Authenticated By: Genevive Bi, M.D.    Medications: Scheduled Meds: . carvedilol  6.25 mg Oral BID WC  . cefTRIAXone (ROCEPHIN)  IV  1 g Intravenous Q24H  . feeding supplement  237 mL Oral BID BM  . [START ON 02/19/2013] furosemide  40 mg Oral Daily  . isosorbide-hydrALAZINE  1 tablet Oral BID  .  lisinopril  20 mg Oral Daily  . pantoprazole  40 mg Oral QHS  . potassium chloride  20 mEq Oral BID  . spironolactone  12.5 mg Oral Daily  . traMADol  50 mg Oral Q6H  . warfarin  7.5 mg Oral ONCE-1800  . Warfarin - Pharmacist Dosing Inpatient   Does not apply q1800      LOS: 15 days   Kela Millin M.D. [facility] 02/18/2013, 4:07 PM Pager: [phone]  If 7PM-7AM, please contact night-coverage [email or web address] Password [name]

## 2013-02-18 NOTE — Progress Notes (Signed)
NUTRITION CONSULT/FOLLOW UP  DOCUMENTATION CODES Per approved criteria  -Severe malnutrition in the context of chronic illness   Intervention:    TPN per pharmacy RD to follow for nutrition care plan  Nutrition Dx:   Altered GI function, ongoing  Goal:   TPN to meet >90% of estimated protein needs, maximize energy provision as able during national lipid backorder, met  Monitor:   TPN prescription, PO intake, weight, labs, I/O's  Assessment:   RD consult for calorie count, ? discontinuing TPN.    RD spoke with Aris [place], PA-C in afternoon of 5/29.  Plan is now to definitely continue TPN at home given suboptimal oral intake, ongoing malnutrition & post-op state.  RD notified PharmD.  Patient is receiving cyclic TPN with Clinimix E 5/15: 69mL/hr x 1 hour, then 148mL/hr over 10 hours, then 60 mL/hr x 1 hour for a total of over 12 hours.  Lipids (20% IVFE @  20 ml/hr), multivitamins, and trace elements are provided 3 times weekly (MWF) due to national backorder.  Provides 1569 kcal and 96 grams protein daily (based on weekly average).  Meets 87% minimum estimated kcal and 100% minimum estimated protein needs.  Height: Ht Readings from Last 1 Encounters:  02/16/13 5\' 4"  (1.626 m)    Weight Status:   Wt Readings from Last 1 Encounters:  02/18/13 123 lb 7.3 oz (56 kg)    Re-estimated needs:  Kcal: 1800-1920 Protein: 80-95 gm Fluid: 1.8-1.9 L  Skin: Intact  Diet Order: General   Intake/Output Summary (Last 24 hours) at 02/18/13 1536 Last data filed at 02/18/13 0201  Gross per 24 hour  Intake      0 ml  Output   2500 ml  Net  -2500 ml    Last BM: 5/29  Labs:   Recent Labs Lab 02/16/13 0530 02/17/13 0500 02/18/13 0540  NA 139 141 140  K 2.8* 3.8 3.7  CL 103 104 104  CO2 28 29 26   BUN 32* 33* 31*  CREATININE 1.00 1.07 0.89  CALCIUM 10.0 10.5 10.3  MG 1.6 1.8 1.9  PHOS 3.1 4.3 3.9  GLUCOSE 82 93 82    Scheduled Meds: . carvedilol  6.25 mg Oral  BID WC  . cefTRIAXone (ROCEPHIN)  IV  1 g Intravenous Q24H  . feeding supplement  237 mL Oral BID BM  . [START ON 02/19/2013] furosemide  40 mg Oral Daily  . isosorbide-hydrALAZINE  1 tablet Oral BID  . lisinopril  20 mg Oral Daily  . pantoprazole  40 mg Oral QHS  . potassium chloride  20 mEq Oral BID  . spironolactone  12.5 mg Oral Daily  . traMADol  50 mg Oral Q6H  . warfarin  7.5 mg Oral ONCE-1800  . Warfarin - Pharmacist Dosing Inpatient   Does not apply q1800    Continuous Infusions: . Marland KitchenTPN (CLINIMIX-E) Adult      Maureen Chatters, RD, LDN Pager #: [phone] After-Hours Pager #: [phone]

## 2013-02-19 DIAGNOSIS — E43 Unspecified severe protein-calorie malnutrition: Secondary | ICD-10-CM | POA: Insufficient documentation

## 2013-02-19 LAB — PROTIME-INR: Prothrombin Time: 15.9 seconds — ABNORMAL HIGH (ref 11.6–15.2)

## 2013-02-19 LAB — BASIC METABOLIC PANEL
BUN: 29 mg/dL — ABNORMAL HIGH (ref 6–23)
CO2: 28 mEq/L (ref 19–32)
Chloride: 103 mEq/L (ref 96–112)
GFR calc Af Amer: >90 mL/min (ref >90)
Potassium: 3.9 mEq/L (ref 3.5–5.1)

## 2013-02-19 LAB — PHOSPHORUS: Phosphorus: 3.7 mg/dL (ref 2.3–4.6)

## 2013-02-19 MED ORDER — LISINOPRIL 20 MG PO TABS: 20.0000 mg | ORAL_TABLET | Freq: Every day | ORAL | Status: DC

## 2013-02-19 MED ORDER — CEFUROXIME AXETIL 250 MG PO TABS: 250.0000 mg | ORAL_TABLET | Freq: Two times a day (BID) | ORAL | Status: DC

## 2013-02-19 MED ORDER — CARVEDILOL 6.25 MG PO TABS: 6.2500 mg | ORAL_TABLET | Freq: Two times a day (BID) | ORAL | Status: DC

## 2013-02-19 MED ORDER — OXYCODONE-ACETAMINOPHEN 10-325 MG PO TABS: 1.0000 | ORAL_TABLET | Freq: Four times a day (QID) | ORAL | Status: DC | PRN

## 2013-02-19 MED ORDER — WARFARIN SODIUM 5 MG PO TABS: 5.0000 mg | ORAL_TABLET | Freq: Every day | ORAL | Status: DC

## 2013-02-19 MED ORDER — FUROSEMIDE 40 MG PO TABS: 40.0000 mg | ORAL_TABLET | Freq: Every day | ORAL | Status: DC

## 2013-02-19 MED ORDER — ISOSORB DINITRATE-HYDRALAZINE 20-37.5 MG PO TABS: 1.0000 | ORAL_TABLET | Freq: Two times a day (BID) | ORAL | Status: DC

## 2013-02-19 MED ORDER — HEPARIN SOD (PORK) LOCK FLUSH 100 UNIT/ML IV SOLN
250.0000 [IU] | INTRAVENOUS | Status: AC | PRN
  Administered 2013-02-19: 500 [IU]

## 2013-02-19 MED ORDER — SPIRONOLACTONE 12.5 MG HALF TABLET: 12.5000 mg | ORAL_TABLET | Freq: Every day | ORAL | Status: DC

## 2013-02-19 NOTE — Progress Notes (Addendum)
PARENTERAL NUTRITION CONSULT NOTE - Follow up  Pharmacy Consult for TPN Indication: complex GI history with pending fistula repair  Allergies  Allergen Reactions  . Penicillins Other (See Comments)    "anything w/penicillin in it makes me bleed"  . Labetalol Hcl Itching and Other (See Comments)    Bumps to bilateral arms.  . Morphine And Related Hives    Patient Measurements: Height: 5\' 4"  (162.6 cm) Weight: 128 lb 1.4 oz (58.1 kg) IBW/kg (Calculated) : 54.7  Vital Signs: Temp: 97.8 F (36.6 C) (05/30 0521) Temp src: Oral (05/30 0521) BP: 110/64 mmHg (05/30 0521) Pulse Rate: 80 (05/30 0521) Intake/Output from previous day: 05/29 0701 - 05/30 0700 In: 3255 [P.O.:870; I.V.:180; TPN:1965] Out: 800 [Urine:800] Intake/Output from this shift: Total I/O In: -  Out: 465 [Emesis/NG output:450; Drains:15]  Labs:  Recent Labs  Mar 19, 2013 0500 02/18/13 0540 02/19/13 0550  WBC 6.1  --   --   HGB 8.2*  --   --   HCT 25.5*  --   --   PLT 400  --   --   INR 1.28 1.23 1.30     Recent Labs  03-19-13 0500 02/18/13 0540 02/19/13 0550  NA 141 140 139  K 3.8 3.7 3.9  CL 104 104 103  CO2 29 26 28   GLUCOSE 93 82 63*  BUN 33* 31* 29*  CREATININE 1.07 0.89 0.86  CALCIUM 10.5 10.3 10.2  MG 1.8 1.9  --   PHOS 4.3 3.9 3.7  PROT  --  6.7  --   ALBUMIN  --  2.7*  --   AST  --  23  --   ALT  --  25  --   ALKPHOS  --  67  --   BILITOT  --  0.2*  --    Estimated Creatinine Clearance: 77.3 ml/min (by C-G formula based on Cr of 0.86).   No results found for this basename: GLUCAP,  in the last 72 hours  Medical History: Past Medical History  Diagnosis Date  . History of blood transfusion     "I had ~ 7 in 01/2012"  . Anemia   . Cervical cancer ~ 2011    cervical  . Radiation Nov.18,2011-Jan.23,2012    cervix  . Hypertension     no treatment necessary for BP  since July 2013  . Cardiomyopathy 02/14/2013  . Non-ischemic cardiomyopathy, EF 25 % 02/14/2013  . At risk for  sudden cardiac death 03-19-2013    Insulin Requirements in the past 24 hours:  None- SSI has been discontinued  Current Nutrition:  Regular diet, also receiving Ensure BID BM; Clinimix E 5/15:  8mL/hr x1 hour, then 138mL/hr over 10 hours, then 86mL/hr x1 hour for a total of over 12 hours. Provides 95g of protein/day and average of 1560kcal/day with lipids given 3x week.   Assessment: Patient known to pharmacy from past TPN consults. She has a prolapsed ostomy which is s/p repair and is awaiting a fistula repair. Patient has extensive GI history. Was being worked up for possible obstruction but there does not appear to be one. Through conversation with RD and surgery PA, desire TPN at this point to ensure patient stays nutritionally stable prior to future procedures. Note plans to hold off on surgery indefinitely until nutritional status is improved. Per surgery, planning for discharge today.  Nutritional Goals:  1800-1920 kCal, 80-95 grams of protein per day per RD recommendations 5/23  GI: s/p prolapsed colectomy revision,  appears no obstruction, some BS and abd is soft but bloated, less nauseous. Having BM. PO intake good- noted 100% meals consumed for a few days; PO PPI  Endo: no hx, has not required insulin in her TPN in the past, SSI has been discontinued  Lytes: Lytes ok. Noted patient has accumulated lytes in the past and has required lyte free formula  Renal: SCr stable, CrCl ~79mL/min; urology recommends cytoscopy and retrograde pyelograms in OR to fully finish evaluation for hematuria  Pulm: RA  Cards: seen by cards for cardiomyopathy (EF 5/10 20%, BNP 1234 5/28) For now will be controlling BP and reducing afterload- VSS, on Lasix po daily, BiDil, lisinopril, and Lopressor; has LifeVest; spoke with Dr. Herbie Baltimore 5/28 regarding fluid status- as long as patient stays net negative, it will be beneficial for her. The sooner TPN can stop, the better for her fluid status and cardiac  treatment. Is doing well on PO Lasix currently (down 24lbs from peak weight of 147)  Hepatobil: alkphos, AST, ALT all nml, albumin low at 2.7, TBili low at 0.2 (improving); baseline prealbumin 12.1, recheck 11.7 (only 1 day after baseline check), 3rd check normal at 18.3; trigs 56.  Neuro: no issues  ID: WBC 6.1, afebrile; 5/18, 5/25 and 5/26 UCx- multiple bacteria Ceftriaxone 5/26>>  Heme: Warfarin for age indeterminate DVT found on doppler 5/22; Bridge lovenox d/c'd 5/29. Pt still having hematuria but much improved.  Best Practices: wafarin  TPN Access: PICC placed 5/19 TPN day#: started 5/19  Plan:  - Change Clinimix E 5/15 to 14 hr cycle so rate not quite as high during cycle- 1mL/hr x1 hour, then 111mL/hr over 12 hours, then 11mL/hr x1 hour for a total of over 14 hours - Trace elements, MVI, and IV Lipids 102m l/hr MWF only due to ongoing shortages  - Will fax note/TPN order to [facility] as pt to be discharged home today  Christoper Fabian, PharmD, BCPS Clinical pharmacist, pager [phone] 02/19/2013 1:51 PM

## 2013-02-19 NOTE — Progress Notes (Signed)
[facility]  Subjective: LEE improved.  Breathing better.  Objective: Vital signs in last 24 hours: Temp:  [97.8 F (36.6 C)-98.4 F (36.9 C)] 97.8 F (36.6 C) (05/30 0521) Pulse Rate:  [80-81] 80 (05/30 0521) Resp:  [17-18] 18 (05/30 0521) BP: (110-138)/(64-93) 110/64 mmHg (05/30 0521) SpO2:  [100 %] 100 % (05/30 0521) Weight:  [128 lb 1.4 oz (58.1 kg)] 128 lb 1.4 oz (58.1 kg) (05/30 0521) Last BM Date: 02/18/13  Intake/Output from previous day: 05/29 0701 - 05/30 0700 In: 3255 [P.O.:870; I.V.:180; TPN:1965] Out: 800 [Urine:800] Intake/Output this shift: Total I/O In: -  Out: 450 [Emesis/NG output:450]  Medications Current Facility-Administered Medications  Medication Dose Route Frequency Provider Last Rate Last Dose  . carvedilol (COREG) tablet 6.25 mg  6.25 mg Oral BID WC Nada Boozer, NP   6.25 mg at 02/19/13 0805  . cefTRIAXone (ROCEPHIN) 1 g in dextrose 5 % 50 mL IVPB  1 g Intravenous Q24H Ripudeep K Rai, MD   1 g at 02/18/13 1348  . feeding supplement (ENSURE COMPLETE) liquid 237 mL  237 mL Oral BID BM Cherylynn Ridges, MD   237 mL at 02/18/13 1306  . furosemide (LASIX) tablet 40 mg  40 mg Oral Daily Chrystie Nose, MD      . hydrALAZINE (APRESOLINE) injection 5 mg  5 mg Intravenous Q4H PRN Jerald Kief, MD   5 mg at 02/10/13 2119  . HYDROmorphone (DILAUDID) injection 0.5-2 mg  0.5-2 mg Intravenous Q4H PRN Cherylynn Ridges, MD   2 mg at 02/19/13 0805  . isosorbide-hydrALAZINE (BIDIL) 20-37.5 MG per tablet 1 tablet  1 tablet Oral BID Wilburt Finlay, PA-C   1 tablet at 02/18/13 2240  . lisinopril (PRINIVIL,ZESTRIL) tablet 20 mg  20 mg Oral Daily Soniyah Mcglory, MD   20 mg at 02/18/13 1055  . ondansetron (ZOFRAN) injection 4 mg  4 mg Intravenous Q6H PRN Cherylynn Ridges, MD   4 mg at 02/19/13 0900  . oxyCODONE (Oxy IR/ROXICODONE) immediate release tablet 5-15 mg  5-15 mg Oral Q4H PRN Liz Malady, MD   15 mg at 02/19/13 0615  . pantoprazole  (PROTONIX) EC tablet 40 mg  40 mg Oral QHS Herby Abraham, RPH   40 mg at 02/18/13 2240  . promethazine (PHENERGAN) injection 6.25-12.5 mg  6.25-12.5 mg Intravenous Q6H PRN Letha Cape, PA-C   12.5 mg at 02/18/13 1833  . sodium chloride 0.9 % injection 10-40 mL  10-40 mL Intracatheter PRN Letha Cape, PA-C   10 mL at 02/18/13 1724  . spironolactone (ALDACTONE) tablet 12.5 mg  12.5 mg Oral Daily Anthoney Sheppard, MD   12.5 mg at 02/18/13 1055  . traMADol (ULTRAM) tablet 50 mg  50 mg Oral Q6H Liz Malady, MD   50 mg at 02/19/13 0501  . Warfarin - Pharmacist Dosing Inpatient   Does not apply q1800 Eugene Garnet, [facility]        PE: General appearance: alert, cooperative and no distress Lungs: clear to auscultation bilaterally Heart: regular rate and rhythm, S1, S2 normal, no murmur, click, rub or gallop Extremities: No LEE Pulses: 2+ and symmetric Neurologic: Grossly normal  Lab Results:   Recent Labs  02/17/13 0500  WBC 6.1  HGB 8.2*  HCT 25.5*  PLT 400   BMET  Recent Labs  02/17/13 0500 02/18/13 0540  NA 141 140  K 3.8 3.7  CL 104 104  CO2 29  26  GLUCOSE 93 82  BUN 33* 31*  CREATININE 1.07 0.89  CALCIUM 10.5 10.3   PT/INR  Recent Labs  02/17/13 0500 02/18/13 0540  LABPROT 15.7* 15.3*  INR 1.28 1.23    Assessment/Plan   Principal Problem:   Colostomy prolapse, with revision  Active Problems:   Acute combined systolic and diastolic heart failure   Pulmonary HTN, severe   DVT, femoral, acute   Post-radiation rectal bleeding   Entero-colonic fistula   Essential hypertension, benign   Thrombocytosis   Bilateral lower extremity edema   Non-ischemic cardiomyopathy, EF 25 %   Anemia, secondary to blood loss   Hypokalemia   Gross hematuria   At risk for sudden cardiac death, now with life vest  Plan:  Net fluids since admission:  -7.2L.  BP and HR stable.  Infrequent PVCs.  BP and HR stable.  Lifevest fitted.   FU appt arranged at [facility] for  6/3.   LOS: 16 days    HAGER, BRYAN 02/19/2013 10:10 AM  I have seen and examined the patient along with Judie Grieve hager, PA.  I have reviewed the chart, notes and new data.  I agree with PA's note.  Key new complaints: feels well Key examination changes: clinically euvolemic Key new findings / data: K 3.9, normal renal function.  PLAN: Will plan additional titration of HF meds as an outpatient. Reevaluate EF in 3 months.  Thurmon Fair, MD, [facility] [phone] 02/19/2013, 3:20 PM

## 2013-02-19 NOTE — Discharge Summary (Signed)
Physician Discharge Summary  Patient ID: Vanessa [name] MRN: [id number] DOB/AGE: 1975-12-31 37 y.o.  Admit date: 02/03/2013 Discharge date: 02/19/2013  Admission Diagnoses:  Discharge Diagnoses:  Principal Problem:   Colostomy prolapse, with revision  Active Problems:   Post-radiation rectal bleeding   Entero-colonic fistula   Essential hypertension, benign   Thrombocytosis   Acute combined systolic and diastolic heart failure   Pulmonary HTN, severe   Bilateral lower extremity edema   DVT, femoral, acute   Non-ischemic cardiomyopathy, EF 25 %   Anemia, secondary to blood loss   Hypokalemia   Gross hematuria   At risk for sudden cardiac death, now with life vest   Protein-calorie malnutrition, severe   Discharged Condition: fair  Hospital Course: The Patient Was Admitted with a Prolapsed and Ischemic Colostomy. She was taken to surgery urgently where she had revision of her colostomy. Within 24 hours the colostomy had prolapsed again however it was not ischemic at that time, and the pain had significantly decreased the colostomy was functional and therefore the patient was maintained in the hospital until she had adequate oral nutrition.  After several days it was noted that the patient's oral intake was minimal. She was found to be malnourished. She's not taking in enough nourishment. She was also found to have a significant cardiomyopathy with a 20% ejection fracture from a previous echocardiogram. Cardiology saw the patient and started specific treatments. The patient was also started on TPN to help bolster her nutrition.  The patient also developed discomfort and pain in her left calf was found to have a femoral vein deep venous thrombosis. She was started on anticoagulation initially however developed hematuria and this was thought. After a few days the Lovenox and Coumadin was restarted and the patient has tolerated that successfully without additional hematuria or problems  with dropping hematocrit.  The patient's oral intake has improved however her nutritional status is still marginal. She has a prealbumin of over 18 however albumin is only 2.4. She is being discharged home with continued TPN. She is also on Coumadin at 5 mg a day and her INR will be checked by the home health nurse also. She was returned to see me in approximately one week at which time I will check her recurrent prolapse colostomy which is functional.  The patient has an enterocolonic fistula leading to defecation which would not be normal for this patient whose been completely diverted proximally. This will need to be addressed in the future surgically however with the patient's current nutritional status and with her cardiomyopathy and significant global hypokinesis of a heart we do not feel as though this is best on the go ahead with surgery.  Consults: cardiology, general surgery and dietary  Significant Diagnostic Studies: labs: cbc/cmet/BNP, microbiology: wound culture: positive for enteric bugs, radiology: fistulagram demonstrating enterocolonic fistula and cardiac graphics: Echocardiogram: cardiomyopathy with EF 20%  Treatments: IV hydration, antibiotics: Cipro, anticoagulation: warfarin, TPN and surgery: revision of colostomy  Discharge Exam: Blood pressure 110/64, pulse 80, temperature 97.8 F (36.6 C), temperature source Oral, resp. rate 18, height 5\' 4"  (1.626 m), weight 58.1 kg (128 lb 1.4 oz), SpO2 100.00%. General appearance: alert, cooperative and no distress GI: abnormal findings:  tenderness  and peristomal prolapse Extremities: edema LLE  Disposition: 01-Home or Self Care  Discharge Orders   Future Appointments Provider Department Dept Phone   02/23/2013 11:10 AM Cherylynn Ridges, MD [facility], [place] [phone]   02/23/2013 1:40 PM Nada Boozer, NP  [facility] [place] [phone]   Future Orders Complete By Expires     Change  dressing (specify)  As directed     Comments:      Ostomy care per home health care nurse. Known prolapsed colostomy    Diet - low sodium heart healthy  As directed     Discharge instructions  As directed     Comments:      Home Health Care to see patient daily for TPN cyclic administration and check of PT/INR    Increase activity slowly  As directed         Medication List    STOP taking these medications       magnesium oxide 400 (241.3 MG) MG tablet  Commonly known as:  MAG-OX     metoCLOPramide 10 MG tablet  Commonly known as:  REGLAN      TAKE these medications       carvedilol 6.25 MG tablet  Commonly known as:  COREG  Take 1 tablet (6.25 mg total) by mouth 2 (two) times daily with a meal.     cefUROXime 250 MG tablet  Commonly known as:  CEFTIN  Take 1 tablet (250 mg total) by mouth 2 (two) times daily.     feeding supplement Liqd  Take 237 mLs by mouth 2 (two) times daily between meals.     furosemide 40 MG tablet  Commonly known as:  LASIX  Take 1 tablet (40 mg total) by mouth daily.     isosorbide-hydrALAZINE 20-37.5 MG per tablet  Commonly known as:  BIDIL  Take 1 tablet by mouth 2 (two) times daily.     lisinopril 20 MG tablet  Commonly known as:  PRINIVIL,ZESTRIL  Take 1 tablet (20 mg total) by mouth daily.     ondansetron 8 MG disintegrating tablet  Commonly known as:  ZOFRAN ODT  Take 1 tablet (8 mg total) by mouth every 8 (eight) hours as needed for nausea.     oxyCODONE-acetaminophen 10-325 MG per tablet  Commonly known as:  PERCOCET  Take 1 tablet by mouth every 6 (six) hours as needed for pain.     potassium chloride SA 20 MEQ tablet  Commonly known as:  K-DUR,KLOR-CON  Take 1 tablet (20 mEq total) by mouth 3 (three) times daily.     spironolactone 12.5 mg Tabs  Commonly known as:  ALDACTONE  Take 0.5 tablets (12.5 mg total) by mouth daily.     warfarin 5 MG tablet  Commonly known as:  COUMADIN  Take 1 tablet (5 mg total) by mouth  daily.           Follow-up Information   Follow up with [facility], NP On 02/23/2013. (at 1:40 pm for cardilogy follow up)    Contact information:   [address] [place] [address] [phone]       Follow up with Antony Haste, MD. (call for appt upon discharge.)    Contact information:   [address] [place] [address] [phone]       Follow up with Cherylynn Ridges, MD On 02/25/2013. (Come for colostomy follow up and check PT/INR)    Contact information:   [address]  [place] [address] [phone]       Signed: Cherylynn Ridges 02/19/2013, 1:39 PM

## 2013-02-19 NOTE — Progress Notes (Signed)
Patient ID: Vanessa [name]  female  RUE:[id number]    DOB: 11-Mar-1976    DOA: 02/03/2013  PCP: Dorrene German, MD  Assessment/Plan: Hypertension: BP stable  Cardiomyopathy with EF of 20%, global hypokinesis and Peripheral edema:  - On Coreg and ACEI, BIDIL, lasix. continue Lasix for negative balance as she is receiving TPN as well - Cardiac MRI  5/27b with Severe LVE with diffuse hypokinesis and abnormal septal  motion EF 26%; No hyperenhancement or scar tissue in LV myocardium - VQ scan neg for PE - Management per [facility] cardiology>>plan to arrange for life vest  Acute DVT right lower extremity  - Started on Lovenox by surgery 5/27 as hematuria cleared per patient. cardiology also recommends restarting Coumadin (unless plans of surgery this hospitalization). Cardiology does not recommend IVC filter. -coumadin restarted per primary today 5/28 -agree with not bridging lovenox/coumadin upon d/c secondary to hematuria  Hematuria: today states and still intermittent but much improved - Urine cultures only with multiple bacterial morphotypes -Continue empiric oral antibiotics for UTI for 5 more days upon discharge -Up outpatient with Dr. Mena Goes for further recommendations, outpatient cystoscopy planned  Mild bilateral hydronephrosis: Renal function is normal  - Pt is to follow up with Dr Gearldine Shown outpt upon d/c>> outpt cysto planned  Thrombocytosis: Likely reactive, stable.   prolapsed ostomy, s/p repair with recurrent non-ischemic prolapse  - Per [facility]  DVT Prophylaxis:  Code Status:  Disposition: per primary service.      Subjective: States some intermittent Hematuria but much less, denies n/v today  Objective: Weight change: 2.1 kg (4 lb 10.1 oz)  Intake/Output Summary (Last 24 hours) at 02/19/13 1152 Last data filed at 02/19/13 0942  Gross per 24 hour  Intake   3255 ml  Output   1250 ml  Net   2005 ml   Blood pressure 110/64, pulse 80, temperature 97.8 F (36.6  C), temperature source Oral, resp. rate 18, height 5\' 4"  (1.626 m), weight 58.1 kg (128 lb 1.4 oz), SpO2 100.00%.  Physical Exam: General: No acute distress, pleasant and cooperative CVS: S1-S2 clear, Chest: CTAB Abdomen: soft normal bowel sounds Extremities: no cyanosis, clubbing.L >R LE edema   Lab Results: Basic Metabolic Panel:  Recent Labs Lab 02/18/13 0540 02/19/13 0550  NA 140 139  K 3.7 3.9  CL 104 103  CO2 26 28  GLUCOSE 82 63*  BUN 31* 29*  CREATININE 0.89 0.86  CALCIUM 10.3 10.2  MG 1.9  --   PHOS 3.9 3.7   Liver Function Tests:  Recent Labs Lab 02/15/13 0515 02/18/13 0540  AST 37 23  ALT 28 25  ALKPHOS 70 67  BILITOT 0.1* 0.2*  PROT 6.3 6.7  ALBUMIN 2.4* 2.7*   No results found for this basename: LIPASE, AMYLASE,  in the last 168 hours No results found for this basename: AMMONIA,  in the last 168 hours CBC:  Recent Labs Lab 02/15/13 0515 02/17/13 0500  WBC 6.3 6.1  NEUTROABS 3.7 3.2  HGB 8.3* 8.2*  HCT 25.5* 25.5*  MCV 89.8 89.2  PLT 398 400   Cardiac Enzymes: No results found for this basename: CKTOTAL, CKMB, CKMBINDEX, TROPONINI,  in the last 168 hours BNP: No components found with this basename: POCBNP,  CBG: No results found for this basename: GLUCAP,  in the last 168 hours   Micro Results: Recent Results (from the past 240 hour(s))  URINE CULTURE     Status: None   Collection Time    02/14/13  8:14 AM      Result Value Range Status   Specimen Description URINE, RANDOM   Final   Special Requests NONE   Final   Culture  Setup Time 02/14/2013 19:29   Final   Colony Count >=100,000 COLONIES/ML   Final   Culture     Final   Value: Multiple bacterial morphotypes present, none predominant. Suggest appropriate recollection if clinically indicated.   Report Status 02/15/2013 FINAL   Final  URINE CULTURE     Status: None   Collection Time    02/15/13  2:00 PM      Result Value Range Status   Specimen Description URINE, RANDOM    Final   Special Requests NONE   Final   Culture  Setup Time 02/15/2013 14:38   Final   Colony Count 80,000 COLONIES/ML   Final   Culture     Final   Value: Multiple bacterial morphotypes present, none predominant. Suggest appropriate recollection if clinically indicated.   Report Status 02/16/2013 FINAL   Final    Studies/Results: Ct Abdomen Pelvis W Contrast  01/28/2013   *RADIOLOGY REPORT*  Clinical Data: Left lower quadrant abdominal pain with lower extremity and abdominal edema.  Left lower quadrant drain placed 7 months ago.  History of cervical cancer, radiation and chemotherapy, sigmoid colectomy with ostomy.  CT ABDOMEN AND PELVIS WITH CONTRAST  Technique:  Multidetector CT imaging of the abdomen and pelvis was performed following the standard protocol during bolus administration of intravenous contrast.  Contrast: 80mL OMNIPAQUE IOHEXOL 300 MG/ML  SOLN  Comparison: Abdominal pelvic CT 12/03/2012.  Barium enema 01/18/2013.  Findings: Images through the lung bases demonstrate stable cardiomegaly and linear scarring or atelectasis at both lung bases. There are trace bilateral pleural effusions.  There is mild ascites with diffuse soft tissue edema consistent with anasarca.  The abdomen is imaged prior to portal vein opacification.  There is some retrograde filling of the IVC and hepatic veins.  The hepatic steatosis has improved compared with the most recent study.  The previously noted low density lesions in the right and left hepatic lobes are grossly stable. The gallbladder is surgically absent. There is a stable nonobstructing calculus in the lower pole of the left kidney.  There is no hydronephrosis.  The right kidney, spleen, pancreas and adrenal glands appear normal.  Descending colostomy and left lower quadrant percutaneous drain appear unchanged.  Evaluation of the pelvic anatomy is limited by the anasarca and arterial phase imaging.  However, there is suspicion of a persistent fistula  between the terminal ileum and the Hartmann pouch manifesting as atypical extension of contrast and air between these structures, best seen on the coronal images 61 - 70. There is likely a small amount of residual extraluminal air and fluid in the left lower quadrant inferior and anterior to the surgical drain.  This appears grossly unchanged.  There is diffuse bladder wall thickening.  There is diffuse thickening of the wall of the rectosigmoid colon and perirectal soft tissue stranding. There is still some air within the vagina, but no contrast.  There are no worrisome osseous findings.  IMPRESSION:  1.  Progressive anasarca with diffuse soft tissue edema, ascites and trace pleural effusions. 2.  Cardiomegaly with reflux of contrasted blood into the IVC and hepatic veins suggesting a degree of right heart failure. 3.  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  No large drainable collection identified.   Original Report Authenticated By: Carey Bullocks, M.D.   Dg Abd 2 Views  02/08/2013   *RADIOLOGY REPORT*  Clinical Data: Prolapsing colostomy and abdominal distention.  ABDOMEN - 2 VIEW  Comparison: 02/01/2013  Findings: There is a left lower quadrant colostomy.  Left lower quadrant pigtail drainage catheter is identified in the projection of the left iliac bone.  There is moderate gaseous distention of the transverse colon.  A few prominent small bowel loops are noted which measure up to 2.8 cm.  IMPRESSION:  1.  Moderate gaseous distention of the transverse colon. 2.  Pigtail drainage catheter projects over the left iliac bone and is presumably within the left iliac fossa as before.   Original Report Authenticated By: Signa Kell, M.D.   Dg Abd Acute W/chest  02/01/2013   *RADIOLOGY REPORT*  Clinical Data: Abdominal pain  ACUTE ABDOMEN SERIES (ABDOMEN 2 VIEW & CHEST 1 VIEW)   Comparison: 01/28/2013 CT  Findings: Cardiomegaly.  Central vascular congestion. No focal consolidation.  Surgical clips right upper quadrant.  No free intraperitoneal air. Surgical drain projects over the left lower quadrant.  There is air within normal caliber colon and a small amount of contrast within the left lower colon projecting just above the colostomy. There is also high attenuation over the colostomy, most in keeping with ingested contrast from the recent comparison. 2 small nonspecific metallic densities project over the mid pelvis. Gentle leftward curvature of the spine.  No acute osseous finding.  IMPRESSION: Surgical drain projects over the left lower abdomen/upper pelvis.  Nonspecific bowel gas pattern without overt obstruction.   Original Report Authenticated By: Jearld Lesch, M.D.   Dg Colon W/cm - Wo/w Kub  01/18/2013   *RADIOLOGY REPORT*  Clinical Data: No complex surgical history.  The patient has a cervical cancer radiation therapy the pelvis.  Subsequent a diverting colostomy and Hartmann's pouch.  Persistent drainage of stool through the Hartmann's pouch.  The patient additionally has a percutaneous drain in the pelvis.  The  SINGLE COLUMN BARIUM ENEMA  Technique:  Initial scout AP supine abdominal image obtained to insure adequate colon cleansing.  Barium was introduced into the colon in a retrograde fashion and refluxed from the rectum to the cecum. Spot images of the colon followed by overhead radiographs were obtained.  Fluoroscopy time: 2 minutes 29 seconds  Comparison:  CT 12/03/2012, and fistulogram, 08/25/2012, small bowel follow through the 09/20/2012  Findings:  A Foley catheter was inserted per rectum and 5 cc  gas insufflated into the retention balloon.  Under fluoroscopic observation are soluble contrast was administered via gravity feed per rectum.  Contrast filled the Hartmann's pouch.  Contrast was quickly seen within the cecum.  Contrast was also quickly seen within  the distal small bowel.  Contrast flowed antegrade into the ascending and transverse colon to the level of the splenic flexure. The is high density contrast within the terminal ileum.  No significant contrast flowed into the percutaneous drain.  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  Findings discussed with Dr. [name] on 01/18/2013.   Original Report Authenticated By: Genevive Bi, M.D.    Medications: Scheduled Meds: . carvedilol  6.25 mg Oral BID WC  . cefTRIAXone (ROCEPHIN)  IV  1 g Intravenous Q24H  .  feeding supplement  237 mL Oral BID BM  . furosemide  40 mg Oral Daily  . isosorbide-hydrALAZINE  1 tablet Oral BID  . lisinopril  20 mg Oral Daily  . pantoprazole  40 mg Oral QHS  . spironolactone  12.5 mg Oral Daily  . traMADol  50 mg Oral Q6H  . Warfarin - Pharmacist Dosing Inpatient   Does not apply q1800      LOS: 16 days   Kela Millin M.D. [facility] 02/19/2013, 11:52 AM Pager: [phone]  If 7PM-7AM, please contact night-coverage [email or web address] Password [name]

## 2013-02-19 NOTE — Progress Notes (Signed)
Discharge instructions gone over. Prescription for pain med given to patient. Other scripts faxed to patient pharmacy. Follow up appointment to be made. Other appointments are scheduled. Home medications gone over. Diet, activity, and completing antibiotics gone over. [facility] to follow patient for cyclic TPN and lab work for protime and INRs. Patient is being discharged with picc line. Signs and symptoms of worsening condition and or infection gone over with patient. Medications that patient was to stop taking were gone over. Patient verbalized understanding of instructions and had no further questions.

## 2013-02-22 ENCOUNTER — Telehealth: Payer: Self-pay | Admitting: Physician Assistant

## 2013-02-22 NOTE — Telephone Encounter (Signed)
Left message for patient on answering machine.

## 2013-02-23 ENCOUNTER — Telehealth (INDEPENDENT_AMBULATORY_CARE_PROVIDER_SITE_OTHER): Payer: Self-pay

## 2013-02-23 ENCOUNTER — Ambulatory Visit (INDEPENDENT_AMBULATORY_CARE_PROVIDER_SITE_OTHER): Payer: Self-pay | Admitting: Cardiology

## 2013-02-23 ENCOUNTER — Ambulatory Visit (INDEPENDENT_AMBULATORY_CARE_PROVIDER_SITE_OTHER): Payer: Self-pay | Admitting: Pharmacist Clinician (PhC)/ Clinical Pharmacy Specialist

## 2013-02-23 ENCOUNTER — Encounter (INDEPENDENT_AMBULATORY_CARE_PROVIDER_SITE_OTHER): Payer: Self-pay | Admitting: General Surgery

## 2013-02-23 ENCOUNTER — Encounter: Payer: Self-pay | Admitting: Cardiology

## 2013-02-23 ENCOUNTER — Ambulatory Visit (INDEPENDENT_AMBULATORY_CARE_PROVIDER_SITE_OTHER): Payer: Medicaid Other | Admitting: General Surgery

## 2013-02-23 VITALS — BP 116/88 | HR 88 | Ht 64.0 in | Wt 128.5 lb

## 2013-02-23 VITALS — BP 122/87 | HR 98 | Temp 98.6°F | Resp 16 | Ht 64.0 in | Wt 129.0 lb

## 2013-02-23 DIAGNOSIS — Z789 Other specified health status: Secondary | ICD-10-CM

## 2013-02-23 DIAGNOSIS — Z7901 Long term (current) use of anticoagulants: Secondary | ICD-10-CM

## 2013-02-23 DIAGNOSIS — IMO0002 Reserved for concepts with insufficient information to code with codable children: Secondary | ICD-10-CM

## 2013-02-23 DIAGNOSIS — Z5181 Encounter for therapeutic drug level monitoring: Secondary | ICD-10-CM

## 2013-02-23 DIAGNOSIS — K9409 Other complications of colostomy: Secondary | ICD-10-CM

## 2013-02-23 DIAGNOSIS — K632 Fistula of intestine: Secondary | ICD-10-CM

## 2013-02-23 DIAGNOSIS — I428 Other cardiomyopathies: Secondary | ICD-10-CM

## 2013-02-23 DIAGNOSIS — I5041 Acute combined systolic (congestive) and diastolic (congestive) heart failure: Secondary | ICD-10-CM

## 2013-02-23 DIAGNOSIS — E43 Unspecified severe protein-calorie malnutrition: Secondary | ICD-10-CM

## 2013-02-23 DIAGNOSIS — R0602 Shortness of breath: Secondary | ICD-10-CM

## 2013-02-23 DIAGNOSIS — I824Y9 Acute embolism and thrombosis of unspecified deep veins of unspecified proximal lower extremity: Secondary | ICD-10-CM

## 2013-02-23 DIAGNOSIS — Z9189 Other specified personal risk factors, not elsewhere classified: Secondary | ICD-10-CM

## 2013-02-23 DIAGNOSIS — I2789 Other specified pulmonary heart diseases: Secondary | ICD-10-CM

## 2013-02-23 DIAGNOSIS — I272 Pulmonary hypertension, unspecified: Secondary | ICD-10-CM

## 2013-02-23 NOTE — Progress Notes (Signed)
This is a new start for her.

## 2013-02-23 NOTE — Telephone Encounter (Signed)
Spoke with [facility] and they will draw pre albumin during patients next lab draw per Dr [name]

## 2013-02-23 NOTE — Progress Notes (Signed)
The patient comes in today stating that she is feeling fairly well. Her colostomy which had been edematous and protruding is still prolapse but the edema has gone down significantly. The patient states that she is eating very well. She continues to have drainage from her rectum and also a small amount of the same type of drainage from her Harrison Mons drain in the left lower quadrant.  Examination today I was able to perform an endoscopy which went in approximately 8 or 9 cm. At that point he came to an abrupt fall with some discomfort to the patient. I cannot identify a specific area where the drainage was coming from although some of the same foul-smelling beige drainage was noted during the anoscopic examination.  The patient is to have labs performed in the near future including a PT/INR in a comprehensive metabolic panel. Patient also needs a pre-albumin level drawn and I will make sure that when they draw her labs today but that is also drawn. If she is adequately nourished with that then we will start to taper off her for TPN and e normally. I will see her back in approximately one month. 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

## 2013-02-23 NOTE — Assessment & Plan Note (Signed)
Followed by Dr. [name], per his note today he felt she was stable.

## 2013-02-23 NOTE — Assessment & Plan Note (Signed)
BP controlled, no SOB.

## 2013-02-23 NOTE — Assessment & Plan Note (Signed)
Pt continues with TPN per home health, need to monitor her CHF with TPN. Though today she is stable.

## 2013-02-23 NOTE — Assessment & Plan Note (Signed)
Acute and probable chronic. INR is lower today at 1.4 her pharmacist has adjusted her Coumadin dose. With Lovenox or heparin patient had hematuria which she does not have at this point

## 2013-02-23 NOTE — Assessment & Plan Note (Signed)
INR low today, meds adjusted in coumadin clinic.  HH will draw INRs and we will follow.

## 2013-02-23 NOTE — Assessment & Plan Note (Signed)
Much improved and wt is stable. No SOB, no DOE. Continue lasix at current dose.

## 2013-02-23 NOTE — Assessment & Plan Note (Signed)
Needs meds titrated, but will hold until next visit, she was discharged after several med changes 4 days ago.  Would increase coreg in 1 - 2 weeks. Will address on next visit.

## 2013-02-23 NOTE — Progress Notes (Addendum)
02/23/2013  TCM pt  PCP: Dorrene German, MD   Chief Complaint  Patient presents with  . Follow-up    post hospital    Primary Cardiologist: Dr. Royann Shivers   HPI:  37 y.o. female mother of 8 (4 girls and four boys ranging for 52-22 years of age). She has a history of Cervical cancer, Radiation treatment, Continued tobacco abuse since age 47, HTN, eclampsia, anemia, Colostomy plus revision, cholecystectomy. Family history significant for CAD in a grandmother and HTN in motherr. She had a 2D echo on 01/30/13 which revealed and EF of 20%, diffuse hypokinesis, grade two diastolic dysfunction and PA pressure of . She also has inferolateral TWI on EKG and prolonged QTc. She initially presented with bilateral LEE and Abdominal pain. Net fluids since admission are -1.8L. She currently complains of SOB, orthopnea, LEE, PND for the last three weeks. Additionally, she's been nauseated, dizzy with abd pain. She denies any CP or chest tightness in the last three weeks. She is S/P repair of a recurrent non-ischemic prolapsed ostomy with recent admission.  She was also found to have femoral DVT which may be chronic and was placed on Coumadin. The VQ scan was negative for PE.  Cardiac MRI was negative for coronary disease was noted, no anomalous coronary artery origins, no LAA thrombus, severe LVE with EF 26%.  During the hospitalization on Lovenox or heparin crossover with Coumadin patient developed significant hematuria. Therefore we are letting her INR drift up.  She is also on TPN during the hospitalization as well as at home she uses a 14 hours a day.  Today she has no complaints she feels somewhat better she can walk without any shortness of breath has no chest pain and really no complaints from a cardiac perspective.  She has a high-risk for sudden cardiac death due to her low EF since she does have her life vest on.  She has been weaned daily and her weight today is 8 ounces less than her discharge  weight. She is watching her diet as well.  She saw Dr. [name] earlier today and he thought she was doing well.    Allergies  Allergen Reactions  . Penicillins Other (See Comments)    "anything w/penicillin in it makes me bleed"  . Labetalol Hcl Itching and Other (See Comments)    Bumps to bilateral arms.  . Morphine And Related Hives    Current Outpatient Prescriptions  Medication Sig Dispense Refill  . carvedilol (COREG) 6.25 MG tablet Take 1 tablet (6.25 mg total) by mouth 2 (two) times daily with a meal.  60 tablet  0  . cefUROXime (CEFTIN) 250 MG tablet Take 1 tablet (250 mg total) by mouth 2 (two) times daily.  10 tablet  0  . feeding supplement (ENSURE COMPLETE) LIQD Take 237 mLs by mouth 2 (two) times daily between meals.      . furosemide (LASIX) 40 MG tablet Take 1 tablet (40 mg total) by mouth daily.  30 tablet  0  . isosorbide-hydrALAZINE (BIDIL) 20-37.5 MG per tablet Take 1 tablet by mouth 2 (two) times daily.  60 tablet  0  . lisinopril (PRINIVIL,ZESTRIL) 20 MG tablet Take 1 tablet (20 mg total) by mouth daily.  60 tablet  0  . ondansetron (ZOFRAN ODT) 8 MG disintegrating tablet Take 1 tablet (8 mg total) by mouth every 8 (eight) hours as needed for nausea.  20 tablet  0  . oxyCODONE-acetaminophen (PERCOCET) 10-325 MG per tablet Take 1  tablet by mouth every 6 (six) hours as needed for pain.  50 tablet  0  . spironolactone (ALDACTONE) 12.5 mg TABS Take 0.5 tablets (12.5 mg total) by mouth daily.  60 tablet  0  . warfarin (COUMADIN) 5 MG tablet Take 1 tablet (5 mg total) by mouth daily.  30 tablet  0   No current facility-administered medications for this visit.    Past Medical History  Diagnosis Date  . History of blood transfusion     "I had ~ 7 in 01/2012"  . Anemia   . Cervical cancer ~ 2011    cervical  . Radiation Nov.18,2011-Jan.23,2012    cervix  . Hypertension     no treatment necessary for BP  since July 2013  . Non-ischemic cardiomyopathy, EF 25 %  02/14/2013  . At risk for sudden cardiac death 2013-03-06    hs life vest    Past Surgical History  Procedure Laterality Date  . Radiation implants  ~ 2011    "for cervical cancer"  . Colonoscopy  01/30/2012    Procedure: COLONOSCOPY;  Surgeon: Beverley Fiedler, MD;  [facility] END[facility]COPY;  Service: Gastroenterology;  Laterality: N/A;  . Colonoscopy  01/30/2012    Procedure: COLONOSCOPY;  Surgeon: Beverley Fiedler, MD;  Location: [facility] OR;  Service: Gastroenterology;  Laterality: N/A;  . Colostomy revision  02/04/2012    Procedure: COLON RESECTION SIGMOID;  Surgeon: Cherylynn Ridges, MD;  Location: [facility] OR;  Service: General;  Laterality: N/A;  . Cholecystectomy  2006  . Laparotomy  08/11/2012    Procedure: EXPLORATORY LAPAROTOMY;  Surgeon: Cherylynn Ridges, MD;  Location: [facility] OR;  Service: General;  Laterality: N/A;  . Laparoscopic lysis of adhesions  08/11/2012    Procedure: LAPAROSCOPIC LYSIS OF ADHESIONS;  Surgeon: Cherylynn Ridges, MD;  Location: [facility] OR;  Service: General;  Laterality: N/A;  . Bowel resection  08/11/2012    Procedure: SMALL BOWEL RESECTION;  Surgeon: Cherylynn Ridges, MD;  Location: [facility] OR;  Service: General;  Laterality: N/A;  . Colostomy  08/11/2012    Procedure: COLOSTOMY;  Surgeon: Cherylynn Ridges, MD;  Location: [facility] OR;  Service: General;  Laterality: N/A;  . Colostomy revision N/A 02/03/2013    Procedure: COLOSTOMY REVISION;  Surgeon: Cherylynn Ridges, MD;  Location: [facility] OR;  Service: General;  Laterality: N/A;    WGN:FAOZHYQ:MV colds or fevers, no weight changes Skin:no rashes or ulcers HEENT:no blurred vision, no congestion CV:see HPI PUL:see HPI GI:no diarrhea constipation or melena, no indigestion, JP drain from abd incision. GU:NO hematuria, no dysuria MS:no joint pain, no claudication Neuro:no syncope, no lightheadedness Endo:no diabetes, no thyroid disease  PHYSICAL EXAM BP 116/88  Pulse 88  Ht 5\' 4"  (1.626 m)  Wt 128 lb 8 oz (58.287 kg)  BMI 22.05 kg/m2 General:Pleasant affect,  NAD Skin:Warm and dry, brisk capillary refill HEENT:normocephalic, sclera clear, mucus membranes moist Neck:supple, + JVD 1 cm, no bruits  Heart:S1S2 RRR with systolic murmur, no gallup, rub or click Lungs:clear without rales, rhonchi, or wheezes HQI:ONGE, non tender, + BS, do not palpate liver spleen or masses Ext:no lower ext edema, 2+ pedal pulses, 2+ radial pulses Neuro:alert and oriented, MAE, follows commands, + facial symmetry  ASSESSMENT AND PLAN Acute combined systolic and diastolic heart failure Much improved and wt is stable. No SOB, no DOE. Continue lasix at current dose.  Non-ischemic cardiomyopathy, EF 25 % Needs meds titrated, but will hold until next visit, she was discharged after  several med changes 4 days ago.  Would increase coreg in 1 - 2 weeks. Will address on next visit.   DVT, femoral, acute Acute and probable chronic. INR is lower today at 1.4 her pharmacist has adjusted her Coumadin dose. With Lovenox or heparin patient had hematuria which she does not have at this point  At risk for sudden cardiac death, now with life vest Lifevest is in place, after medication titration will recheck 2-D echo and hopefully EF will have improved. If not she would be a candidate for ICD. We discussed this today and we also discussed during hospitalization.  Colostomy prolapse, with revision  Followed by Dr. [name], per his note today he felt she was stable.  Protein-calorie malnutrition, severe Pt continues with TPN per home health, need to monitor her CHF with TPN. Though today she is stable.  Pulmonary HTN, severe BP controlled, no SOB.  Anticoagulation goal of INR 2 to 3, secondary to DVT INR low today, meds adjusted in coumadin clinic.  HH will draw INRs and we will follow.

## 2013-02-23 NOTE — Assessment & Plan Note (Signed)
Lifevest is in place, after medication titration will recheck 2-D echo and hopefully EF will have improved. If not she would be a candidate for ICD. We discussed this today and we also discussed during hospitalization.

## 2013-02-23 NOTE — Patient Instructions (Addendum)
Weigh daily Call [phone] if weight climbs more than 3 pounds in a day or 5 pounds in a week. No salt to very little salt in your diet.  No more than 2000 mg in a day. Call if increased shortness of breath or increased swelling.   Follow up with Dr. Royann Shivers in 3-4 weeks.  You will need repeat echo of your heart in 3 months.  Bloodwork today

## 2013-02-24 NOTE — Addendum Note (Signed)
Addended by: Leone Brand on: 02/24/2013 02:44 PM   Modules accepted: Level of Service

## 2013-02-25 ENCOUNTER — Telehealth (INDEPENDENT_AMBULATORY_CARE_PROVIDER_SITE_OTHER): Payer: Self-pay

## 2013-02-25 ENCOUNTER — Other Ambulatory Visit (INDEPENDENT_AMBULATORY_CARE_PROVIDER_SITE_OTHER): Payer: Self-pay

## 2013-02-25 ENCOUNTER — Encounter (INDEPENDENT_AMBULATORY_CARE_PROVIDER_SITE_OTHER): Payer: Self-pay | Admitting: *Deleted

## 2013-02-25 DIAGNOSIS — K632 Fistula of intestine: Secondary | ICD-10-CM

## 2013-02-25 MED ORDER — OXYCODONE-ACETAMINOPHEN 10-325 MG PO TABS: 1.0000 | ORAL_TABLET | Freq: Four times a day (QID) | ORAL | Status: DC | PRN

## 2013-02-25 NOTE — Progress Notes (Deleted)
Subjective:     Patient ID: Vanessa [name], female   DOB: Oct 05, 1975, 37 y.o.   MRN: [id number]  HPI   Review of Systems     Objective:   Physical Exam     Assessment:     ***    Plan:     ***

## 2013-02-25 NOTE — Progress Notes (Signed)
Patient arrived in office today regarding prescription.  Patient states she received a prescription from Dr. [name] on 02/16/13 for Percocet 10/325mg  1-2 every 6 hours #50.  Patient states that Dr. [name] verbally told her it was ok to take every 4 hours so the pharmacy will not fill the new prescription because they state it is not time yet.  Spoke to Dr. [name] who stated he never told the patient to take every 4 hours however he does give permission to go ahead a fill new prescription.  This RN called [facility] [phone] and spoke to [place] and [employer] (Pharmacist) who agree to go ahead and fill new prescription.  Patient updated she should only take 1 tablet every 6 hours and is agreeable at this time.

## 2013-02-25 NOTE — Telephone Encounter (Signed)
The patient called requesting more Percocet 10 mg.  She is having lower abdominal pain.  Please call and let her know.

## 2013-02-25 NOTE — Telephone Encounter (Signed)
I called patient and let her know her prescription is at front desk for pick up.

## 2013-03-04 ENCOUNTER — Telehealth (INDEPENDENT_AMBULATORY_CARE_PROVIDER_SITE_OTHER): Payer: Self-pay | Admitting: *Deleted

## 2013-03-04 NOTE — Telephone Encounter (Signed)
Spoke to Gross MD who agreed that this would needs to be refilled by Dr. [name] and if patient had taken as prescribed then patient would not be out at this time.

## 2013-03-04 NOTE — Telephone Encounter (Signed)
Patient called to request a refill of her Percocet.  Explained to patient that Dr. [name] would have to give permission for that however he is unavailable today so this RN will have to wait to speak with the urgent office MD at 230p when he arrives.  Patients last refill was on 02/25/13 for Percocet #40 and was told only to take 1 every 6 hours so patient should not be out of Percocet at this time if patient took as instructed, should last 12.5 days.

## 2013-03-04 NOTE — Telephone Encounter (Signed)
Patient called back and was informed that this RN will need to speak with Dr [name].  Asked patient how she could be out since this RN explained the last time when she picked up her prescription that she could only take 1 tablet every 6 hours and use alternative OTC medications to help with break through pain.  Patient states that she understood this but she has to take it every 4 hours so she needs another prescription.  Again explained that she needs to take it as the doctor prescribed however patient states she is going to take it so that it helps her and she doesn't care how the doctor prescribed it.  Patient again asked to come pick up a new prescription, this RN explained that Dr. [name] is going to need to make that decision so patient hung up.

## 2013-03-04 NOTE — Telephone Encounter (Signed)
Correction to below message.  Patient received #50 not #40.

## 2013-03-08 ENCOUNTER — Telehealth (INDEPENDENT_AMBULATORY_CARE_PROVIDER_SITE_OTHER): Payer: Self-pay

## 2013-03-08 NOTE — Telephone Encounter (Signed)
I received a message back from Dr [name] that her medicine can be refilled until surgery.  I went back to Dr Corliss Skains and he agreed to sign for Percocet 10/325 po # 50 1 po q 6 hrs prn no refill.  I notified the pt.

## 2013-03-08 NOTE — Telephone Encounter (Signed)
vicodin per protocol.

## 2013-03-08 NOTE — Telephone Encounter (Signed)
Patient called requesting refill on Percocet.  Dr [name] is unavailable.  Please advise

## 2013-03-08 NOTE — Telephone Encounter (Signed)
I called to notify the patient that I can call her in some Vicodin.  She states they won't do any good and asked when Dr [name] will be back.  She said she would take more of the Vicodin then she is supposed to and she is not going to pick them up so there is no need to call them in.  I told her Dr [name] is available again on Thursday.

## 2013-03-12 ENCOUNTER — Telehealth (INDEPENDENT_AMBULATORY_CARE_PROVIDER_SITE_OTHER): Payer: Self-pay | Admitting: General Surgery

## 2013-03-12 NOTE — Telephone Encounter (Signed)
Patient called stated: "PICC line won't flush" Patient stated her HHA manages her PICC line. I advised her to call her HHA. Patient understood and will do.

## 2013-03-17 ENCOUNTER — Encounter: Payer: Self-pay | Admitting: Cardiovascular Disease

## 2013-03-17 ENCOUNTER — Ambulatory Visit (INDEPENDENT_AMBULATORY_CARE_PROVIDER_SITE_OTHER): Payer: Self-pay | Admitting: Cardiovascular Disease

## 2013-03-17 VITALS — BP 108/68 | HR 101 | Resp 18 | Ht 64.0 in | Wt 129.0 lb

## 2013-03-17 DIAGNOSIS — I5041 Acute combined systolic (congestive) and diastolic (congestive) heart failure: Secondary | ICD-10-CM

## 2013-03-17 DIAGNOSIS — I428 Other cardiomyopathies: Secondary | ICD-10-CM

## 2013-03-17 DIAGNOSIS — I272 Pulmonary hypertension, unspecified: Secondary | ICD-10-CM

## 2013-03-17 DIAGNOSIS — I2789 Other specified pulmonary heart diseases: Secondary | ICD-10-CM

## 2013-03-17 MED ORDER — CARVEDILOL 12.5 MG PO TABS: ORAL_TABLET | ORAL | Status: DC

## 2013-03-17 MED ORDER — CARVEDILOL 12.5 MG PO TABS: 12.5000 mg | ORAL_TABLET | Freq: Two times a day (BID) | ORAL | Status: DC

## 2013-03-17 NOTE — Assessment & Plan Note (Signed)
Currently appears to be truly asymptomatic, NYHA functional class I, euvolemic. Note that she requires a very low dose of loop diuretic to maintain compensation. She is at high risk for electrolyte imbalance secondary to her colostomy and concomitant therapy with loop diuretic and aldosterone antagonist. Would check a BMET monthly.

## 2013-03-17 NOTE — Assessment & Plan Note (Signed)
Hopefully this is primarily related to insufficiently treated hypertension and therefore will be largely reversible. Her blood pressure is normal now, but beta blockers and ACE inhibitors should be titrated to the maximum tolerated dose. Repeat echocardiography is scheduled for early September. If left ventricular ejection fraction remains less than 35% we will have to discuss permanent defibrillator implantation. Today I have asked her to increase her carvedilol to 12.5 mg twice a day. I warned her that this may be associated with a brief period of worsening shortness of breath, edema or dizziness and that she should cause of the symptoms are more than very mild. Otherwise return in 2 weeks for further medication titration. If she has tolerated the increase in carvedilol without excessive hypotension hold next increase lisinopril dose to 40 mg once daily.

## 2013-03-17 NOTE — Patient Instructions (Addendum)
Increase Carvedilol to 12.5mg  1 1/2 twice a day.  You may notice an increase in swelling and/or shortness of breath.  If it is only mild please continue, if worse please call the office.  Return to see our PA in 2 weeks.

## 2013-03-17 NOTE — Assessment & Plan Note (Signed)
The majority of his problem was probably related to decompensated left heart failure. Cannot exclude a component of thromboembolic venous disease;. She is on warfarin

## 2013-03-17 NOTE — Progress Notes (Signed)
Patient ID: Vanessa [name], female   DOB: 1976-02-20, 37 y.o.   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 female mother of 8. She has a history of Cervical cancer, Radiation treatment, Continued tobacco abuse since age 67, HTN, eclampsia, anemia, Colostomy plus revision, cholecystectomy. Family history significant for CAD in a grandmother and HTN in motherr. She had a 2D echo on 01/30/13 which revealed and EF of 20%, diffuse hypokinesis, grade two diastolic dysfunction and PA pressure of . blood pressure was high. Cardiac MRI showed no evidence of coronary disease or anomalies or an obvious etiology for her cardiomyopathy. 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 She had significant hematuria on Lovenox but is now therapeutically anticoagulated with warfarin and does not have problems of bleeding.  From a symptomatic standpoint she is doing really well. She denies any problems with shortness of breath either at rest or with exertion and has not had dizziness syncope or palpitations. She is wearing a life vest but has not had any alarms or device discharges.    Allergies  Allergen Reactions  . Penicillins Other (See Comments)    "anything w/penicillin in it makes me bleed"  . Labetalol Hcl Itching and Other (See Comments)    Bumps to bilateral arms.  . Morphine And Related Hives    Current Outpatient Prescriptions  Medication Sig Dispense Refill  . carvedilol (COREG) 12.5 MG tablet Take 1 tablet (12.5 mg total) by mouth 2 (two) times daily with a meal. Take 1 1/2 tablets two times a day.  90 tablet  1  . cefUROXime (CEFTIN) 250 MG tablet Take 1 tablet (250 mg total) by mouth 2 (two) times daily.  10 tablet  0  . feeding supplement (ENSURE COMPLETE) LIQD Take 237 mLs by mouth 2 (two) times daily between meals.      .  furosemide (LASIX) 40 MG tablet Take 1 tablet (40 mg total) by mouth daily.  30 tablet  0  . isosorbide-hydrALAZINE (BIDIL) 20-37.5 MG per tablet Take 1 tablet by mouth 2 (two) times daily.  60 tablet  0  . lisinopril (PRINIVIL,ZESTRIL) 20 MG tablet Take 1 tablet (20 mg total) by mouth daily.  60 tablet  0  . ondansetron (ZOFRAN ODT) 8 MG disintegrating tablet Take 1 tablet (8 mg total) by mouth every 8 (eight) hours as needed for nausea.  20 tablet  0  . oxyCODONE-acetaminophen (PERCOCET) 10-325 MG per tablet Take 1 tablet by mouth every 6 (six) hours as needed for pain.  50 tablet  0  . spironolactone (ALDACTONE) 12.5 mg TABS Take 0.5 tablets (12.5 mg total) by mouth daily.  60 tablet  0  . warfarin (COUMADIN) 5 MG tablet Take 1 tablet (5 mg total) by mouth daily.  30 tablet  0   No current facility-administered medications for this visit.    Past Medical History  Diagnosis Date  . History of blood transfusion     "I had ~ 7 in 01/2012"  . Anemia   . Cervical cancer ~ 2011    cervical  . Radiation Nov.18,2011-Jan.23,2012    cervix  . Hypertension     no treatment necessary for BP  since July 2013  . Non-ischemic cardiomyopathy, EF 25 % 02/14/2013  . At risk for sudden cardiac death 26-Feb-2013    [facility] life  vest    Past Surgical History  Procedure Laterality Date  . Radiation implants  ~ 2011    "for cervical cancer"  . Colonoscopy  01/30/2012    Procedure: COLONOSCOPY;  Surgeon: Beverley Fiedler, MD;  Location: [facility] ENDOSCOPY;  Service: Gastroenterology;  Laterality: N/A;  . Colonoscopy  01/30/2012    Procedure: COLONOSCOPY;  Surgeon: Beverley Fiedler, MD;  Location: [facility] OR;  Service: Gastroenterology;  Laterality: N/A;  . Colostomy revision  02/04/2012    Procedure: COLON RESECTION SIGMOID;  Surgeon: Cherylynn Ridges, MD;  Location: [facility] OR;  Service: General;  Laterality: N/A;  . Cholecystectomy  2006  . Laparotomy  08/11/2012    Procedure: EXPLORATORY LAPAROTOMY;  Surgeon: Cherylynn Ridges, MD;   Location: [facility] OR;  Service: General;  Laterality: N/A;  . Laparoscopic lysis of adhesions  08/11/2012    Procedure: LAPAROSCOPIC LYSIS OF ADHESIONS;  Surgeon: Cherylynn Ridges, MD;  Location: [facility] OR;  Service: General;  Laterality: N/A;  . Bowel resection  08/11/2012    Procedure: SMALL BOWEL RESECTION;  Surgeon: Cherylynn Ridges, MD;  Location: [facility] OR;  Service: General;  Laterality: N/A;  . Colostomy  08/11/2012    Procedure: COLOSTOMY;  Surgeon: Cherylynn Ridges, MD;  Location: [facility] OR;  Service: General;  Laterality: N/A;  . Colostomy revision N/A 02/03/2013    Procedure: COLOSTOMY REVISION;  Surgeon: Cherylynn Ridges, MD;  Location: [facility] OR;  Service: General;  Laterality: N/A;    Family History  Problem Relation Age of Onset  . Hypertension Mother   . Healthy Father   . Healthy Brother   . Healthy Brother     History   Social History  . Marital Status: Single    Spouse Name: N/A    Number of Children: N/A  . Years of Education: N/A   Occupational History  . Not on file.   Social History Main Topics  . Smoking status: Former Smoker -- 0.50 packs/day for .5 years    Types: Cigarettes    Quit date: 01/26/2012  . Smokeless tobacco: Never Used  . Alcohol Use: 0.0 oz/week     Comment: 03/16/12 "haven't drank in ~ 2-3 years"  . Drug Use: Yes    Special: Marijuana     Comment: "stopped smoking marijuana 01/2012"   . Sexually Active: No   Other Topics Concern  . Not on file   Social History Narrative  . No narrative on file    Review of systems: The patient specifically denies any chest pain at rest or with exertion, dyspnea at rest or with exertion, orthopnea, paroxysmal nocturnal dyspnea, syncope, palpitations, focal neurological deficits, intermittent claudication, lower extremity edema, unexplained weight gain, cough, hemoptysis or wheezing.  The patient also denies abdominal pain, nausea, vomiting, dysphagia, she has a colostomy, denies polyuria, polydipsia, dysuria, hematuria,  frequency, urgency, abnormal bleeding or bruising, fever, chills, unexpected weight changes, mood swings, change in skin or hair texture, change in voice quality, auditory or visual problems, allergic reactions or rashes, new musculoskeletal complaints other than usual "aches and pains".   PHYSICAL EXAM BP 108/68  Pulse 101  Resp 18  Ht 5\' 4"  (1.626 m)  Wt 129 lb (58.514 kg)  BMI 22.13 kg/m2  General: Alert, oriented x3, no distress Head: no evidence of trauma, PERRL, EOMI, no exophtalmos or lid lag, no myxedema, no xanthelasma; normal ears, nose and oropharynx Neck: normal jugular venous pulsations and no hepatojugular reflux; brisk carotid pulses without delay and  no carotid bruits Chest: clear to auscultation, no signs of consolidation by percussion or palpation, normal fremitus, symmetrical and full respiratory excursions Cardiovascular: normal position and quality of the apical impulse, regular rhythm, normal first and second heart sounds, no murmurs, rubs or gallops Abdomen: no tenderness or distention, no masses by palpation, no abnormal pulsatility or arterial bruits, normal bowel sounds, no hepatosplenomegaly; left lower quadrant colostomy Extremities: no clubbing, cyanosis or edema; 2+ radial, ulnar and brachial pulses bilaterally; 2+ right femoral, posterior tibial and dorsalis pedis pulses; 2+ left femoral, posterior tibial and dorsalis pedis pulses; no subclavian or femoral bruits Neurological: grossly nonfocal   EKG: Sinus rhythm with biatrial abnormality and left ventricular hypertrophy with a broadened QRS and secondary repolarization abnormalities  Lipid Panel     Component Value Date/Time   CHOL 64 02/09/2013 0622   TRIG 56 02/15/2013 0515    BMET    Component Value Date/Time   NA 139 02/19/2013 0550   K 3.9 02/19/2013 0550   CL 103 02/19/2013 0550   CO2 28 02/19/2013 0550   GLUCOSE 63* 02/19/2013 0550   BUN 29* 02/19/2013 0550   CREATININE 0.86 02/19/2013 0550    CALCIUM 10.2 02/19/2013 0550   CALCIUM 10.7* 02/01/2012 0705   GFRNONAA 85* 02/19/2013 0550   GFRAA >90 02/19/2013 0550     ASSESSMENT AND PLAN Non-ischemic cardiomyopathy, EF 25 % Hopefully this is primarily related to insufficiently treated hypertension and therefore will be largely reversible. Her blood pressure is normal now, but beta blockers and ACE inhibitors should be titrated to the maximum tolerated dose. Repeat echocardiography is scheduled for early September. If left ventricular ejection fraction remains less than 35% we will have to discuss permanent defibrillator implantation. Today I have asked her to increase her carvedilol to 12.5 mg twice a day. I warned her that this may be associated with a brief period of worsening shortness of breath, edema or dizziness and that she should cause of the symptoms are more than very mild. Otherwise return in 2 weeks for further medication titration. If she has tolerated the increase in carvedilol without excessive hypotension hold next increase lisinopril dose to 40 mg once daily.  Pulmonary HTN, severe The majority of his problem was probably related to decompensated left heart failure. Cannot exclude a component of thromboembolic venous disease;. She is on warfarin  Acute combined systolic and diastolic heart failure Currently appears to be truly asymptomatic, NYHA functional class I, euvolemic. Note that she requires a very low dose of loop diuretic to maintain compensation. She is at high risk for electrolyte imbalance secondary to her colostomy and concomitant therapy with loop diuretic and aldosterone antagonist. Would check a BMET monthly.   Orders Placed This Encounter  Procedures  . EKG 12-Lead   Meds ordered this encounter  Medications  . DISCONTD: carvedilol (COREG) 12.5 MG tablet    Sig: Take 1 tablet (12.5 mg total) by mouth 2 (two) times daily with a meal.    Dispense:  60 tablet    Refill:  0  . DISCONTD: carvedilol (COREG)  12.5 MG tablet    Sig: Take 1 1/2 tablets two times a day.    Dispense:  90 tablet    Refill:  1  . carvedilol (COREG) 12.5 MG tablet    Sig: Take 1 tablet (12.5 mg total) by mouth 2 (two) times daily with a meal. Take 1 1/2 tablets two times a day.    Dispense:  90 tablet  Refill:  1    Britlyn Martine  Thurmon Fair, MD, [facility] [phone] office [phone] pager

## 2013-03-19 ENCOUNTER — Telehealth (INDEPENDENT_AMBULATORY_CARE_PROVIDER_SITE_OTHER): Payer: Self-pay

## 2013-03-19 NOTE — Telephone Encounter (Signed)
Patient is asking for refill of percocet 10/325 mg Dr. [name] paged

## 2013-03-19 NOTE — Telephone Encounter (Signed)
LMOM. Prescription up at front desk. Refill percocet 10/325 #50 no refill

## 2013-03-22 ENCOUNTER — Telehealth (HOSPITAL_COMMUNITY): Payer: Self-pay | Admitting: Emergency Medicine

## 2013-03-22 NOTE — Telephone Encounter (Signed)
Directed her to call [facility] as pt is GS.

## 2013-03-28 ENCOUNTER — Emergency Department (HOSPITAL_COMMUNITY)
Admission: EM | Admit: 2013-03-28 | Discharge: 2013-03-28 | Disposition: A | Discharge status: Home / Self Care | Payer: Medicaid Other | Attending: Emergency Medicine | Admitting: Emergency Medicine

## 2013-03-28 ENCOUNTER — Encounter (HOSPITAL_COMMUNITY): Payer: Self-pay | Admitting: Emergency Medicine

## 2013-03-28 DIAGNOSIS — Z79899 Other long term (current) drug therapy: Secondary | ICD-10-CM | POA: Insufficient documentation

## 2013-03-28 DIAGNOSIS — K9409 Other complications of colostomy: Secondary | ICD-10-CM

## 2013-03-28 DIAGNOSIS — Z8541 Personal history of malignant neoplasm of cervix uteri: Secondary | ICD-10-CM | POA: Insufficient documentation

## 2013-03-28 DIAGNOSIS — I428 Other cardiomyopathies: Secondary | ICD-10-CM | POA: Insufficient documentation

## 2013-03-28 DIAGNOSIS — Z7901 Long term (current) use of anticoagulants: Secondary | ICD-10-CM | POA: Insufficient documentation

## 2013-03-28 DIAGNOSIS — K921 Melena: Secondary | ICD-10-CM | POA: Insufficient documentation

## 2013-03-28 DIAGNOSIS — Z9189 Other specified personal risk factors, not elsewhere classified: Secondary | ICD-10-CM | POA: Insufficient documentation

## 2013-03-28 DIAGNOSIS — R141 Gas pain: Secondary | ICD-10-CM | POA: Insufficient documentation

## 2013-03-28 DIAGNOSIS — Z880 Allergy status to penicillin: Secondary | ICD-10-CM | POA: Insufficient documentation

## 2013-03-28 DIAGNOSIS — Z888 Allergy status to other drugs, medicaments and biological substances status: Secondary | ICD-10-CM | POA: Insufficient documentation

## 2013-03-28 DIAGNOSIS — I10 Essential (primary) hypertension: Secondary | ICD-10-CM | POA: Insufficient documentation

## 2013-03-28 DIAGNOSIS — Z885 Allergy status to narcotic agent status: Secondary | ICD-10-CM | POA: Insufficient documentation

## 2013-03-28 DIAGNOSIS — Z933 Colostomy status: Secondary | ICD-10-CM | POA: Insufficient documentation

## 2013-03-28 DIAGNOSIS — R112 Nausea with vomiting, unspecified: Secondary | ICD-10-CM | POA: Insufficient documentation

## 2013-03-28 DIAGNOSIS — R142 Eructation: Secondary | ICD-10-CM | POA: Insufficient documentation

## 2013-03-28 DIAGNOSIS — IMO0002 Reserved for concepts with insufficient information to code with codable children: Secondary | ICD-10-CM | POA: Insufficient documentation

## 2013-03-28 DIAGNOSIS — Z87891 Personal history of nicotine dependence: Secondary | ICD-10-CM | POA: Insufficient documentation

## 2013-03-28 LAB — COMPREHENSIVE METABOLIC PANEL
ALT: 10 U/L (ref 0–35)
Alkaline Phosphatase: 97 U/L (ref 39–117)
BUN: 7 mg/dL (ref 6–23)
CO2: 22 mEq/L (ref 19–32)
GFR calc Af Amer: >90 mL/min (ref >90)
GFR calc non Af Amer: >90 mL/min (ref >90)
Glucose, Bld: 95 mg/dL (ref 70–99)
Potassium: 3.1 mEq/L — ABNORMAL LOW (ref 3.5–5.1)
Sodium: 140 mEq/L (ref 135–145)
Total Protein: 7.7 g/dL (ref 6.0–8.3)

## 2013-03-28 LAB — CBC WITH DIFFERENTIAL/PLATELET
Eosinophils Absolute: 0.2 10*3/uL (ref 0.0–0.7)
Eosinophils Relative: 2 % (ref 0–5)
Hemoglobin: 11.4 g/dL — ABNORMAL LOW (ref 12.0–15.0)
Lymphocytes Relative: 27 % (ref 12–46)
Lymphs Abs: 2.1 10*3/uL (ref 0.7–4.0)
MCH: 27.2 pg (ref 26.0–34.0)
MCV: 82.3 fL (ref 78.0–100.0)
Monocytes Relative: 11 % (ref 3–12)
Neutrophils Relative %: 59 % (ref 43–77)
RBC: 4.19 MIL/uL (ref 3.87–5.11)
WBC: 7.6 10*3/uL (ref 4.0–10.5)

## 2013-03-28 MED ORDER — OXYCODONE-ACETAMINOPHEN 5-325 MG PO TABS
2.0000 | ORAL_TABLET | Freq: Once | ORAL | Status: AC
  Administered 2013-03-28: 2 via ORAL
  Filled 2013-03-28: qty 2

## 2013-03-28 MED ORDER — HYDROMORPHONE HCL PF 1 MG/ML IJ SOLN
1.0000 mg | Freq: Once | INTRAMUSCULAR | Status: AC
  Administered 2013-03-28: 1 mg via INTRAVENOUS
  Filled 2013-03-28: qty 1

## 2013-03-28 MED ORDER — DEXTROSE 50 % IV SOLN: 50.0000 mL | Freq: Once | INTRAVENOUS | Status: DC

## 2013-03-28 MED ORDER — ONDANSETRON HCL 4 MG PO TABS: 4.0000 mg | ORAL_TABLET | Freq: Four times a day (QID) | ORAL | Status: DC

## 2013-03-28 MED ORDER — INSULIN ASPART 100 UNIT/ML ~~LOC~~ SOLN: 10.0000 [IU] | Freq: Once | SUBCUTANEOUS | Status: DC

## 2013-03-28 MED ORDER — POTASSIUM CHLORIDE CRYS ER 20 MEQ PO TBCR
40.0000 meq | EXTENDED_RELEASE_TABLET | Freq: Once | ORAL | Status: AC
  Administered 2013-03-28: 40 meq via ORAL
  Filled 2013-03-28: qty 2

## 2013-03-28 MED ORDER — SODIUM POLYSTYRENE SULFONATE 15 GM/60ML PO SUSP: 30.0000 g | Freq: Once | ORAL | Status: DC

## 2013-03-28 NOTE — ED Provider Notes (Signed)
History    CSN: [id number] Arrival date & time 03/28/13  1446  None    Chief Complaint  Patient presents with  . Abdominal Pain   (Consider location/radiation/quality/duration/timing/severity/associated sxs/prior Treatment) HPI Comments: Patient is a 37 year old female with a history of sigmoid colon resection, small bowel resection, and colostomy with revision with left colostomy bag who presents for swelling of her stoma x 2 days. Patient states that she developed a "twising, squeezing" abdominal pain this AM which originates in her supraumbilical region and radiates to her left at and around her stoma. Patient states the pain is intermittent without aggravating or alleviating factors. She admits to dark brown blood coming from her stoma this AM as well as NB/NB emesis x 1. Patient states she has felt these symptoms before, most recently in 01/2012; symptoms were due to decreased blood flow to her stoma which required her to have a colostomy revision. Surgeon - Dr. [name]. She denies associated fever, CP, SOB, urinary symptoms, numbness/tingling, and extremity weakness.  Patient is a 37 y.o. female presenting with abdominal pain. The history is provided by the patient. No language interpreter was used.  Abdominal Pain Associated symptoms include abdominal pain, nausea and vomiting. Pertinent negatives include no chest pain, fever, numbness or weakness.   Past Medical History  Diagnosis Date  . History of blood transfusion     "I had ~ 7 in 01/2012"  . Anemia   . Cervical cancer ~ 2011    cervical  . Radiation Nov.18,2011-Jan.23,2012    cervix  . Hypertension     no treatment necessary for BP  since July 2013  . Non-ischemic cardiomyopathy, EF 25 % 02/14/2013  . At risk for sudden cardiac death 03/09/13    [facility] life vest   Past Surgical History  Procedure Laterality Date  . Radiation implants  ~ 2011    "for cervical cancer"  . Colonoscopy  01/30/2012    Procedure: COLONOSCOPY;   Surgeon: Beverley Fiedler, MD;  Location: [facility] ENDOSCOPY;  Service: Gastroenterology;  Laterality: N/A;  . Colonoscopy  01/30/2012    Procedure: COLONOSCOPY;  Surgeon: Beverley Fiedler, MD;  Location: [facility] OR;  Service: Gastroenterology;  Laterality: N/A;  . Colostomy revision  02/04/2012    Procedure: COLON RESECTION SIGMOID;  Surgeon: Cherylynn Ridges, MD;  Location: [facility] OR;  Service: General;  Laterality: N/A;  . Cholecystectomy  2006  . Laparotomy  08/11/2012    Procedure: EXPLORATORY LAPAROTOMY;  Surgeon: Cherylynn Ridges, MD;  Location: [facility] OR;  Service: General;  Laterality: N/A;  . Laparoscopic lysis of adhesions  08/11/2012    Procedure: LAPAROSCOPIC LYSIS OF ADHESIONS;  Surgeon: Cherylynn Ridges, MD;  Location: [facility] OR;  Service: General;  Laterality: N/A;  . Bowel resection  08/11/2012    Procedure: SMALL BOWEL RESECTION;  Surgeon: Cherylynn Ridges, MD;  Location: [facility] OR;  Service: General;  Laterality: N/A;  . Colostomy  08/11/2012    Procedure: COLOSTOMY;  Surgeon: Cherylynn Ridges, MD;  Location: [facility] OR;  Service: General;  Laterality: N/A;  . Colostomy revision N/A 02/03/2013    Procedure: COLOSTOMY REVISION;  Surgeon: Cherylynn Ridges, MD;  Location: [facility] OR;  Service: General;  Laterality: N/A;   Family History  Problem Relation Age of Onset  . Hypertension Mother   . Healthy Father   . Healthy Brother   . Healthy Brother    History  Substance Use Topics  . Smoking status: Former Smoker -- 0.50 packs/day for .  5 years    Types: Cigarettes    Quit date: 01/26/2012  . Smokeless tobacco: Never Used  . Alcohol Use: 0.0 oz/week     Comment: last use 2010   OB History   Grav Para Term Preterm Abortions TAB SAB Ect Mult Living   10 8             Review of Systems  Constitutional: Negative for fever.  Respiratory: Negative for shortness of breath.   Cardiovascular: Negative for chest pain.  Gastrointestinal: Positive for nausea, vomiting, abdominal pain and blood in stool.  Genitourinary: Negative for  dysuria and hematuria.  Neurological: Negative for weakness and numbness.  All other systems reviewed and are negative.   Allergies  Penicillins; Labetalol hcl; and Morphine and related  Home Medications   Current Outpatient Rx  Name  Route  Sig  Dispense  Refill  . carvedilol (COREG) 12.5 MG tablet   Oral   Take 1 tablet (12.5 mg total) by mouth 2 (two) times daily with a meal. Take 1 1/2 tablets two times a day.   90 tablet   1   . furosemide (LASIX) 40 MG tablet   Oral   Take 1 tablet (40 mg total) by mouth daily.   30 tablet   0   . isosorbide-hydrALAZINE (BIDIL) 20-37.5 MG per tablet   Oral   Take 1 tablet by mouth 2 (two) times daily.   60 tablet   0   . lisinopril (PRINIVIL,ZESTRIL) 20 MG tablet   Oral   Take 1 tablet (20 mg total) by mouth daily.   60 tablet   0   . Multiple Vitamin (MULTIVITAMIN WITH MINERALS) TABS   Oral   Take 1 tablet by mouth daily.         Marland Kitchen oxyCODONE-acetaminophen (PERCOCET) 10-325 MG per tablet   Oral   Take 1 tablet by mouth every 6 (six) hours as needed for pain.   50 tablet   0   . spironolactone (ALDACTONE) 12.5 mg TABS   Oral   Take 0.5 tablets (12.5 mg total) by mouth daily.   60 tablet   0     Dispense as written.   . warfarin (COUMADIN) 5 MG tablet   Oral   Take 1 tablet (5 mg total) by mouth daily.   30 tablet   0   . ondansetron (ZOFRAN) 4 MG tablet   Oral   Take 1 tablet (4 mg total) by mouth every 6 (six) hours.   12 tablet   0    BP 164/112  Pulse 63  Temp(Src) 97.6 F (36.4 C) (Oral)  Resp 18  Ht 5\' 4"  (1.626 m)  Wt 129 lb (58.514 kg)  BMI 22.13 kg/m2  SpO2 100%  Physical Exam  Nursing note and vitals reviewed. Constitutional: She is oriented to person, place, and time. She appears well-developed and well-nourished. No distress.  HENT:  Head: Normocephalic and atraumatic.  Mouth/Throat: Oropharynx is clear and moist. No oropharyngeal exudate.  Eyes: Conjunctivae and EOM are normal.    Neck: Normal range of motion.  Cardiovascular: Normal rate, regular rhythm and normal heart sounds.   Pulmonary/Chest: Effort normal and breath sounds normal. No respiratory distress. She has no wheezes. She has no rales.  Abdominal: Soft. She exhibits distension (moderate distention around stoma/colostomy bag). There is tenderness in the left lower quadrant. There is no rebound, no guarding, no tenderness at McBurney's point and negative Murphy's sign.    Colostomy  in LLQ and JP drain in L side abd; Stoma swollen and colostomy prolapsed. No peritoneal signs.  Musculoskeletal: Normal range of motion.  Neurological: She is alert and oriented to person, place, and time.  Skin: Skin is warm and dry. No rash noted. She is not diaphoretic. No erythema. No pallor.  Psychiatric: She has a normal mood and affect. Her behavior is normal.    ED Course  Procedures (including critical care time) Labs Reviewed  CBC WITH DIFFERENTIAL - Abnormal; Notable for the following:    Hemoglobin 11.4 (*)    HCT 34.5 (*)    RDW 16.6 (*)    Platelets 432 (*)    All other components within normal limits  COMPREHENSIVE METABOLIC PANEL - Abnormal; Notable for the following:    Potassium 3.1 (*)    All other components within normal limits  URINALYSIS, ROUTINE W REFLEX MICROSCOPIC  PREGNANCY, URINE   No results found.  1. Colostomy prolapse    MDM  Patient presents for colostomy prolapse and stoma swelling x 2 days. Have spoken with Dr. [name] regarding the patient who states that he saw her in the waiting room and examined the area. Have discussed attempting to pack area with granulated sugar and reduce; Dr. [name] did mention he has tried this in the past with little success. He states he did not appreciate the bowel to be ischemic today and believes patient is stable enough to followup in office this week even if bowel unable to be reduced if her pain is able to be well controlled in ED.  Application of  granulated sugar unsuccessful in reducing prolapsed colostomy. Patient's pain well controlled with IV Dilaudid in ED. Patient comfortable with d/c and reliable for f/u with Dr. [name] as outpatient; patient endorses appt scheduled for Tuesday. Patient already with 10/325 Percocet at home for pain control. Strict ED return precautions dicussed with patient who verbalizes comfort and understanding with d/c plan.  Antony Madura, PA-C 03/28/13 1936

## 2013-03-28 NOTE — ED Notes (Signed)
Pt c/o swelling to stoma area on left colostomy x 2 days. Pt c/o abdominal pain onset today. Pt reports n/v,vomited x 1 day.

## 2013-03-30 ENCOUNTER — Ambulatory Visit (INDEPENDENT_AMBULATORY_CARE_PROVIDER_SITE_OTHER): Payer: Medicaid Other | Admitting: General Surgery

## 2013-03-30 ENCOUNTER — Encounter (INDEPENDENT_AMBULATORY_CARE_PROVIDER_SITE_OTHER): Payer: Self-pay | Admitting: General Surgery

## 2013-03-30 VITALS — BP 118/68 | HR 84 | Resp 14 | Ht 64.0 in | Wt 123.6 lb

## 2013-03-30 DIAGNOSIS — IMO0002 Reserved for concepts with insufficient information to code with codable children: Secondary | ICD-10-CM

## 2013-03-30 DIAGNOSIS — K9409 Other complications of colostomy: Secondary | ICD-10-CM

## 2013-03-30 NOTE — Progress Notes (Signed)
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 However the colostomy is viable and still working.  She still has her thighs her prolapsed colostomy. the left side with a drain in place.I drains maybe 10 cc of purulent fluid a day. She continues to have liquid mucoid bowel movement times.  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 No antibiotics are necessary outside of perioperative IV antibiotics.

## 2013-03-30 NOTE — ED Provider Notes (Signed)
Medical screening examination/treatment/procedure(s) were conducted as a shared visit with non-physician practitioner(s) and myself.  I personally evaluated the patient during the encounter Jones Skene, M.D.  Vanessa [name] is a 37 y.o. female presents with prolapsed ileostomy. This has happened before, it required an ileostomy revision. Dr. [name] saw this patient on the way in to the emergency department, and said that the patient can followup with him as an outpatient.  Patient has some mild to moderate pain in the region of her ileostomy, it is still passing stool, she has had some nausea.  No fevers, no chills.  At least 10pt or greater review of systems completed and are negative except where specified in the HPI.  VITAL SIGNS:   Filed Vitals:   03/28/13 1930  BP: 162/112  Pulse: 61  Temp:   Resp:    CONSTITUTIONAL: Awake, oriented, appears non-toxic HENT: Atraumatic, normocephalic, oral mucosa pink and moist, airway patent. Nares patent without drainage. External ears normal. EYES: Conjunctiva clear, EOMI, PERRLA NECK: Trachea midline, non-tender, supple CARDIOVASCULAR: Normal heart rate, Normal rhythm, No murmurs, rubs, gallops PULMONARY/CHEST: Clear to auscultation, no rhonchi, wheezes, or rales. Symmetrical breath sounds. Non-tender. ABDOMINAL: Non-distended, soft, non-tender - no rebound or guarding.  Prolapsed ileostomy, edematous but not dusky mildly tender to palpation. BS normal. NEUROLOGIC: Non-focal, moving all four extremities, no gross sensory or motor deficits. EXTREMITIES: No clubbing, cyanosis, or edema SKIN: Warm, Dry, No erythema, No rash  MDM: Patient has a prolapsed ileostomy that appears edematous, it's not dusky, do not think it is strangulated, do not think it is in danger of necrosis at this time-we'll attempt reduction and reduction of edema by packing it with granulated sugar. Dr. [name] will see the patient in followup.   Jones Skene,  MD 03/30/13 [phone]

## 2013-03-31 ENCOUNTER — Encounter (HOSPITAL_COMMUNITY): Payer: Self-pay

## 2013-03-31 ENCOUNTER — Emergency Department (HOSPITAL_COMMUNITY)
Admission: EM | Admit: 2013-03-31 | Discharge: 2013-03-31 | Disposition: A | Discharge status: Home / Self Care | Payer: Medicaid Other | Attending: Emergency Medicine | Admitting: Emergency Medicine

## 2013-03-31 ENCOUNTER — Emergency Department (HOSPITAL_COMMUNITY): Payer: Medicaid Other

## 2013-03-31 DIAGNOSIS — Z87891 Personal history of nicotine dependence: Secondary | ICD-10-CM | POA: Insufficient documentation

## 2013-03-31 DIAGNOSIS — R110 Nausea: Secondary | ICD-10-CM | POA: Insufficient documentation

## 2013-03-31 DIAGNOSIS — R141 Gas pain: Secondary | ICD-10-CM | POA: Insufficient documentation

## 2013-03-31 DIAGNOSIS — R109 Unspecified abdominal pain: Secondary | ICD-10-CM

## 2013-03-31 DIAGNOSIS — IMO0002 Reserved for concepts with insufficient information to code with codable children: Secondary | ICD-10-CM | POA: Insufficient documentation

## 2013-03-31 DIAGNOSIS — Z7901 Long term (current) use of anticoagulants: Secondary | ICD-10-CM | POA: Insufficient documentation

## 2013-03-31 DIAGNOSIS — Z79899 Other long term (current) drug therapy: Secondary | ICD-10-CM | POA: Insufficient documentation

## 2013-03-31 DIAGNOSIS — E876 Hypokalemia: Secondary | ICD-10-CM | POA: Insufficient documentation

## 2013-03-31 DIAGNOSIS — I10 Essential (primary) hypertension: Secondary | ICD-10-CM | POA: Insufficient documentation

## 2013-03-31 DIAGNOSIS — Z8541 Personal history of malignant neoplasm of cervix uteri: Secondary | ICD-10-CM | POA: Insufficient documentation

## 2013-03-31 DIAGNOSIS — Z8679 Personal history of other diseases of the circulatory system: Secondary | ICD-10-CM | POA: Insufficient documentation

## 2013-03-31 DIAGNOSIS — R142 Eructation: Secondary | ICD-10-CM | POA: Insufficient documentation

## 2013-03-31 DIAGNOSIS — Z862 Personal history of diseases of the blood and blood-forming organs and certain disorders involving the immune mechanism: Secondary | ICD-10-CM | POA: Insufficient documentation

## 2013-03-31 LAB — CBC WITH DIFFERENTIAL/PLATELET
Basophils Absolute: 0.1 10*3/uL (ref 0.0–0.1)
HCT: 30 % — ABNORMAL LOW (ref 36.0–46.0)
Lymphocytes Relative: 31 % (ref 12–46)
Monocytes Absolute: 0.5 10*3/uL (ref 0.1–1.0)
Neutro Abs: 3.6 10*3/uL (ref 1.7–7.7)
RBC: 3.68 MIL/uL — ABNORMAL LOW (ref 3.87–5.11)
RDW: 16.9 % — ABNORMAL HIGH (ref 11.5–15.5)
WBC: 6.2 10*3/uL (ref 4.0–10.5)

## 2013-03-31 LAB — BASIC METABOLIC PANEL
CO2: 24 mEq/L (ref 19–32)
Chloride: 107 mEq/L (ref 96–112)
Creatinine, Ser: 0.56 mg/dL (ref 0.50–1.10)
Sodium: 140 mEq/L (ref 135–145)

## 2013-03-31 MED ORDER — POTASSIUM CHLORIDE CRYS ER 20 MEQ PO TBCR
60.0000 meq | EXTENDED_RELEASE_TABLET | Freq: Once | ORAL | Status: AC
  Administered 2013-03-31: 60 meq via ORAL
  Filled 2013-03-31: qty 3

## 2013-03-31 MED ORDER — OXYCODONE-ACETAMINOPHEN 5-325 MG PO TABS: 2.0000 | ORAL_TABLET | Freq: Once | ORAL | Status: DC

## 2013-03-31 MED ORDER — HYDROMORPHONE HCL PF 1 MG/ML IJ SOLN
1.0000 mg | Freq: Once | INTRAMUSCULAR | Status: AC
  Administered 2013-03-31: 1 mg via INTRAVENOUS
  Filled 2013-03-31: qty 1

## 2013-03-31 MED ORDER — IBUPROFEN 800 MG PO TABS: 800.0000 mg | ORAL_TABLET | Freq: Three times a day (TID) | ORAL | Status: DC | PRN

## 2013-03-31 MED ORDER — CEPHALEXIN 500 MG PO CAPS: 500.0000 mg | ORAL_CAPSULE | Freq: Two times a day (BID) | ORAL | Status: DC

## 2013-03-31 MED ORDER — OXYCODONE-ACETAMINOPHEN 5-325 MG PO TABS: 1.0000 | ORAL_TABLET | Freq: Three times a day (TID) | ORAL | Status: DC | PRN

## 2013-03-31 MED ORDER — HYDROMORPHONE HCL PF 1 MG/ML IJ SOLN: 1.0000 mg | Freq: Once | INTRAMUSCULAR | Status: DC

## 2013-03-31 MED ORDER — IOHEXOL 300 MG/ML  SOLN
25.0000 mL | INTRAMUSCULAR | Status: AC
  Administered 2013-03-31 (×2): 25 mL via ORAL

## 2013-03-31 NOTE — ED Notes (Signed)
Pt has a colostomy & noticed this morning abd pain & that the stoma is protruding approx 8-9 cm into ostomy bag which has progressively worsened through out the day. Also reports stool consistency changed to more liquid & increased purulent drainage in JP drain

## 2013-03-31 NOTE — ED Notes (Signed)
Discharge instructions and information reviewed and given to patient. Instructed patient to phone PCP in AM to f/u on low K+ from today. Patient verbalized understanding and agrees with plan of care.

## 2013-03-31 NOTE — ED Notes (Signed)
Abdominal pain and  Stoma of her lt. Lower colostomy is swelling and increasing in length. Dr. [name] seen the stoma yesterday and has scheduled Surgery for Revision on 7/25/. But pt. Reports increase pain around the stoma sight and the stoma in protuding and is increasing in length.

## 2013-03-31 NOTE — ED Notes (Signed)
Patient transported to X-ray 

## 2013-03-31 NOTE — ED Notes (Signed)
Resident at the bedside

## 2013-03-31 NOTE — ED Notes (Signed)
Per MD's instructions - ostomy appliance removed & stoma saturated & covered with half cup sugar then wrapped with chux. Pt tolerated well.

## 2013-04-03 NOTE — ED Provider Notes (Signed)
History    CSN: [id number] Arrival date & time 03/31/13  1425  First MD Initiated Contact with Patient 03/31/13 1530     Chief Complaint  Patient presents with  . Abdominal Pain   (Consider location/radiation/quality/duration/timing/severity/associated sxs/prior Treatment) HPI  Vanessa [name] is a 37 y.o. female presents with prolapsed ileostomy. This has happened before, it required an ileostomy revision. Dr. [name] saw this patient yesterday, and said that the patient can followup with him as an outpatient (and is scheduled for repair in two weeks.) Patient has some mild to moderate pain in the region of her ileostomy, it is still passing stool, she has had some nausea. No fevers, no chills. Today the pain became severe, w/o clear precipitant.  No relief w home narcotics.  No clear exacerbating factors. The pain is diffuse, but worse about the stoma.  Past Medical History  Diagnosis Date  . History of blood transfusion     "I had ~ 7 in 01/2012"  . Anemia   . Cervical cancer ~ 2011    cervical  . Radiation Nov.18,2011-Jan.23,2012    cervix  . Hypertension     no treatment necessary for BP  since July 2013  . Non-ischemic cardiomyopathy, EF 25 % 02/14/2013  . At risk for sudden cardiac death 2013-02-21    [facility] life vest   Past Surgical History  Procedure Laterality Date  . Radiation implants  ~ 2011    "for cervical cancer"  . Colonoscopy  01/30/2012    Procedure: COLONOSCOPY;  Surgeon: Beverley Fiedler, MD;  Location: [facility] ENDOSCOPY;  Service: Gastroenterology;  Laterality: N/A;  . Colonoscopy  01/30/2012    Procedure: COLONOSCOPY;  Surgeon: Beverley Fiedler, MD;  Location: [facility] OR;  Service: Gastroenterology;  Laterality: N/A;  . Colostomy revision  02/04/2012    Procedure: COLON RESECTION SIGMOID;  Surgeon: Cherylynn Ridges, MD;  Location: [facility] OR;  Service: General;  Laterality: N/A;  . Cholecystectomy  2006  . Laparotomy  08/11/2012    Procedure: EXPLORATORY LAPAROTOMY;  Surgeon: Cherylynn Ridges, MD;  Location: [facility] OR;  Service: General;  Laterality: N/A;  . Laparoscopic lysis of adhesions  08/11/2012    Procedure: LAPAROSCOPIC LYSIS OF ADHESIONS;  Surgeon: Cherylynn Ridges, MD;  Location: [facility] OR;  Service: General;  Laterality: N/A;  . Bowel resection  08/11/2012    Procedure: SMALL BOWEL RESECTION;  Surgeon: Cherylynn Ridges, MD;  Location: [facility] OR;  Service: General;  Laterality: N/A;  . Colostomy  08/11/2012    Procedure: COLOSTOMY;  Surgeon: Cherylynn Ridges, MD;  Location: [facility] OR;  Service: General;  Laterality: N/A;  . Colostomy revision N/A 02/03/2013    Procedure: COLOSTOMY REVISION;  Surgeon: Cherylynn Ridges, MD;  Location: [facility] OR;  Service: General;  Laterality: N/A;   Family History  Problem Relation Age of Onset  . Hypertension Mother   . Healthy Father   . Healthy Brother   . Healthy Brother    History  Substance Use Topics  . Smoking status: Former Smoker -- 0.50 packs/day for .5 years    Types: Cigarettes    Quit date: 01/26/2012  . Smokeless tobacco: Never Used  . Alcohol Use: 0.0 oz/week     Comment: last use 2010   OB History   Grav Para Term Preterm Abortions TAB SAB Ect Mult Living   10 8             Review of Systems  Constitutional:  Per HPI, otherwise negative  HENT:       Per HPI, otherwise negative  Respiratory:       Per HPI, otherwise negative  Cardiovascular:       Per HPI, otherwise negative  Gastrointestinal: Positive for nausea, abdominal pain and abdominal distention. Negative for vomiting, diarrhea and blood in stool.  Endocrine:       Negative aside from HPI  Genitourinary:       Neg aside from HPI   Musculoskeletal:       Per HPI, otherwise negative  Skin: Negative.   Neurological: Negative for syncope.    Allergies  Penicillins; Labetalol hcl; and Morphine and related  Home Medications   Current Outpatient Rx  Name  Route  Sig  Dispense  Refill  . carvedilol (COREG) 12.5 MG tablet   Oral   Take 18.75 mg by mouth 2  (two) times daily with a meal.         . furosemide (LASIX) 40 MG tablet   Oral   Take 1 tablet (40 mg total) by mouth daily.   30 tablet   0   . isosorbide-hydrALAZINE (BIDIL) 20-37.5 MG per tablet   Oral   Take 1 tablet by mouth 2 (two) times daily.   60 tablet   0   . lisinopril (PRINIVIL,ZESTRIL) 20 MG tablet   Oral   Take 1 tablet (20 mg total) by mouth daily.   60 tablet   0   . Multiple Vitamin (MULTIVITAMIN WITH MINERALS) TABS   Oral   Take 1 tablet by mouth daily.         . ondansetron (ZOFRAN) 4 MG tablet   Oral   Take 1 tablet (4 mg total) by mouth every 6 (six) hours.   12 tablet   0   . oxyCODONE-acetaminophen (PERCOCET) 10-325 MG per tablet   Oral   Take 1 tablet by mouth every 4 (four) hours as needed for pain.         Marland Kitchen spironolactone (ALDACTONE) 12.5 mg TABS   Oral   Take 0.5 tablets (12.5 mg total) by mouth daily.   60 tablet   0     Dispense as written.   . warfarin (COUMADIN) 5 MG tablet   Oral   Take 1 tablet (5 mg total) by mouth daily.   30 tablet   0    BP 166/120  Pulse 67  Temp(Src) 98.8 F (37.1 C) (Oral)  Resp 16  SpO2 100% Physical Exam  Nursing note and vitals reviewed. Constitutional: She is oriented to person, place, and time. She appears well-developed and well-nourished.  HENT:  Head: Normocephalic and atraumatic.  Eyes: EOM are normal. Pupils are equal, round, and reactive to light.  Neck: Normal range of motion. Neck supple.  Cardiovascular: Normal rate, normal heart sounds and intact distal pulses.   Pulmonary/Chest: Effort normal and breath sounds normal.  Abdominal: Soft. Bowel sounds are normal. She exhibits no distension. There is tenderness (mild diffuse tenderness - w prolapsed stoma - red w/o drainage or bleeding.). There is no rebound and no guarding.  Colostomy in LLQ and JP drain in L side abd  Musculoskeletal: Normal range of motion. She exhibits no edema and no tenderness.  Neurological: She is  alert and oriented to person, place, and time. She has normal strength. No cranial nerve deficit or sensory deficit.  Skin: Skin is warm and dry. No rash noted.  Psychiatric: She has a normal mood and  affect.    ED Course  Procedures (including critical care time) Labs Reviewed  CBC WITH DIFFERENTIAL - Abnormal; Notable for the following:    RBC 3.68 (*)    Hemoglobin 10.0 (*)    HCT 30.0 (*)    RDW 16.9 (*)    All other components within normal limits  BASIC METABOLIC PANEL - Abnormal; Notable for the following:    Potassium 2.5 (*)    All other components within normal limits   No results found. 1. Abdominal pain   2. Hypokalemia    .update: I discussed the XR w radiology - with concern for possible perf, I spoke w Dr. [name].  He confirms that the patient likely has chronic mico-perf's and w/o e/o peritonitis or distress, no additional imaging in required. MDM  This patient presents with recurrent pain about a stoma site and concern for additional prolapse.  On exam she is awake alert, smiling.  This remains the case for the patient's emergency department stay. Patient's x-ray has abnormality, but after discussion with the patient's surgical team, this seems chronic, and absent any distress, with stable vital signs, and with her capacity to followup with her surgeon as needed, she is appropriate for discharged without additional imaging.   Gerhard Munch, MD 04/03/13 1704

## 2013-04-05 ENCOUNTER — Telehealth (INDEPENDENT_AMBULATORY_CARE_PROVIDER_SITE_OTHER): Payer: Self-pay | Admitting: *Deleted

## 2013-04-05 ENCOUNTER — Ambulatory Visit: Payer: Self-pay | Admitting: Cardiology

## 2013-04-05 NOTE — Telephone Encounter (Signed)
Patient called to request a refill of her pain medication.  Spoke to Dr. [name] who approved another refill, he states she can have refills until her surgery on 04/16/13.  Prescription written for Percocet 10/325mg  Take 1-2 every 4 hours as needed for pain #60 no refills signed by Cornett MD for Dr. [name].  Patient updated that prescription is at front desk and will come to pick it up.

## 2013-04-06 ENCOUNTER — Encounter: Payer: Self-pay | Admitting: Physician Assistant

## 2013-04-06 ENCOUNTER — Ambulatory Visit (INDEPENDENT_AMBULATORY_CARE_PROVIDER_SITE_OTHER): Payer: Medicaid Other | Admitting: Physician Assistant

## 2013-04-06 VITALS — BP 120/82 | HR 60 | Ht 64.0 in | Wt 129.1 lb

## 2013-04-06 DIAGNOSIS — I2789 Other specified pulmonary heart diseases: Secondary | ICD-10-CM

## 2013-04-06 DIAGNOSIS — I5041 Acute combined systolic (congestive) and diastolic (congestive) heart failure: Secondary | ICD-10-CM

## 2013-04-06 DIAGNOSIS — I5042 Chronic combined systolic (congestive) and diastolic (congestive) heart failure: Secondary | ICD-10-CM | POA: Insufficient documentation

## 2013-04-06 DIAGNOSIS — I272 Pulmonary hypertension, unspecified: Secondary | ICD-10-CM

## 2013-04-06 MED ORDER — LISINOPRIL 20 MG PO TABS: 40.0000 mg | ORAL_TABLET | Freq: Every day | ORAL | Status: DC

## 2013-04-06 NOTE — Patient Instructions (Signed)
Increase lisinopril to 40 mg daily. Follow up with Dr. Salena Saner. in September.

## 2013-04-06 NOTE — Progress Notes (Signed)
Date:  04/06/2013   ID:  Vanessa [name], DOB 1976/08/07, MRN [id number]  PCP:  Dorrene German, MD  Primary Cardiologist:  [facility]    History of Present Illness: Vanessa [name] is a 37 y.o. female with a history of Cervical cancer, Radiation treatment, Continued tobacco abuse since age 20, HTN, eclampsia, anemia, Colostomy plus revision, cholecystectomy. Family history significant for CAD in a grandmother and HTN in motherr. She had a 2D echo on 01/30/13 which revealed and EF of 20%, diffuse hypokinesis, grade two diastolic dysfunction and PA pressure of . blood pressure was high. Cardiac MRI showed no evidence of coronary disease or anomalies or an obvious etiology for her cardiomyopathy. 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 She is now therapeutically anticoagulated with warfarin and does not have problems of bleeding.  Patient presents today for a medication titration. 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 Is scheduled for July 25.  The patient currently denies nausea, vomiting, fever, chest pain, shortness of breath, orthopnea, dizziness, PND, cough, congestion, abdominal pain, hematochezia, melena, lower extremity edema.  Wt Readings from Last 3 Encounters:  04/06/13 129 lb 1.6 oz (58.559 kg)  03/30/13 123 lb 9.6 oz (56.065 kg)  03/28/13 129 lb (58.514 kg)     Past Medical History  Diagnosis Date  . History of blood transfusion     "I had ~ 7 in 01/2012"  . Anemia   . Cervical cancer ~ 2011    cervical  . Radiation Nov.18,2011-Jan.23,2012    cervix  . Hypertension     no treatment necessary for BP  since July 2013  . Non-ischemic cardiomyopathy, EF 25 % 02/14/2013  . At risk for sudden cardiac death 04-Mar-2013    [facility] life vest    Current Outpatient Prescriptions  Medication Sig Dispense Refill  . carvedilol (COREG) 12.5 MG tablet  Take 18.75 mg by mouth 2 (two) times daily with a meal.      . furosemide (LASIX) 40 MG tablet Take 1 tablet (40 mg total) by mouth daily.  30 tablet  0  . isosorbide-hydrALAZINE (BIDIL) 20-37.5 MG per tablet Take 1 tablet by mouth 2 (two) times daily.  60 tablet  0  . lisinopril (PRINIVIL,ZESTRIL) 20 MG tablet Take 2 tablets (40 mg total) by mouth daily.  60 tablet  4  . Multiple Vitamin (MULTIVITAMIN WITH MINERALS) TABS Take 1 tablet by mouth daily.      . ondansetron (ZOFRAN) 4 MG tablet Take 1 tablet (4 mg total) by mouth every 6 (six) hours.  12 tablet  0  . oxyCODONE-acetaminophen (PERCOCET) 10-325 MG per tablet Take 1 tablet by mouth every 4 (four) hours as needed for pain.      Marland Kitchen spironolactone (ALDACTONE) 12.5 mg TABS Take 0.5 tablets (12.5 mg total) by mouth daily.  60 tablet  0  . warfarin (COUMADIN) 5 MG tablet Take 1 tablet (5 mg total) by mouth daily.  30 tablet  0   No current facility-administered medications for this visit.    Allergies:    Allergies  Allergen Reactions  . Penicillins Other (See Comments)    "anything w/penicillin in it makes me bleed"  . Labetalol Hcl Itching and Other (See Comments)    Bumps to bilateral arms.  . Morphine And Related Hives    Social History:  The patient  reports  that she quit smoking about 14 months ago. Her smoking use included Cigarettes. She has a .25 pack-year smoking history. She has never used smokeless tobacco. She reports that  drinks alcohol. She reports that she uses illicit drugs (Marijuana).   Family history:   Family History  Problem Relation Age of Onset  . Hypertension Mother   . Healthy Father   . Healthy Brother   . Healthy Brother     ROS:  Please see the history of present illness.  All other systems reviewed and negative.   PHYSICAL EXAM: VS:  BP 120/82  Pulse 60  Ht 5\' 4"  (1.626 m)  Wt 129 lb 1.6 oz (58.559 kg)  BMI 22.15 kg/m2 Well nourished, well developed, in no acute distress HEENT: Pupils are  equal round react to light accommodation extraocular movements are intact.  Neck: no JVDNo cervical lymphadenopathy. Cardiac: Regular rate and rhythm without murmurs rubs or gallops. Lungs:  clear to auscultation bilaterally, no wheezing, rhonchi or rales Abd: soft, nontender, positive bowel sounds all quadrants, no hepatosplenomegaly Ext: no lower extremity edema.  2+ radial and dorsalis pedis pulses. Skin: warm and dry Neuro:  Grossly normal      ASSESSMENT AND PLAN:  Problem List Items Addressed This Visit   Pulmonary HTN, severe   Relevant Medications      lisinopril (PRINIVIL,ZESTRIL) tablet   Chronic combined systolic and diastolic heart failure, NYHA class 1     Patient appears well compensated and euvolemic at this time. She is on Lasix 40 mg daily along with 12.5 mg of spironolactone. We'll further titrate her lisinopril to 40 mg daily reduce her blood pressure is much as possible as long she remains normotensive an effort to improve her ejection fraction.  Followup 2-D echocardiogram is scheduled for September she will follow up with Dr. Salena Saner. right after that.  She did present today without wearing her life S. We discussed the extreme importance of wearing a all times other than when she's in the shower.    Relevant Medications      lisinopril (PRINIVIL,ZESTRIL) tablet   Acute combined systolic and diastolic heart failure - Primary   Relevant Medications      lisinopril (PRINIVIL,ZESTRIL) tablet

## 2013-04-06 NOTE — Assessment & Plan Note (Addendum)
Patient appears well compensated and euvolemic at this time. She is on Lasix 40 mg daily along with 12.5 mg of spironolactone. We'll further titrate her lisinopril to 40 mg daily reduce her blood pressure is much as possible as long she remains normotensive an effort to improve her ejection fraction.  Followup 2-D echocardiogram is scheduled for September she will follow up with Dr. Salena Saner. right after that.  She did present today without wearing her life S. We discussed the extreme importance of wearing a all times other than when she's in the shower.

## 2013-04-09 ENCOUNTER — Telehealth (INDEPENDENT_AMBULATORY_CARE_PROVIDER_SITE_OTHER): Payer: Self-pay

## 2013-04-09 ENCOUNTER — Encounter (HOSPITAL_COMMUNITY)
Admission: RE | Admit: 2013-04-09 | Discharge: 2013-04-09 | Disposition: A | Discharge status: Home / Self Care | Payer: Medicaid Other | Source: Ambulatory Visit | Attending: General Surgery | Admitting: General Surgery

## 2013-04-09 NOTE — Progress Notes (Signed)
No show for PAT 04/09/2013 10:00. 2 unsuccessful attempts made to reach pt by phone.Dr [name] nurse notified.

## 2013-04-09 NOTE — Telephone Encounter (Signed)
[facility] Short stay states patient was a NO SHOW for pre op today . They have attempted to call her without any success .

## 2013-04-09 NOTE — Pre-Procedure Instructions (Signed)
Vanessa [name]  04/09/2013   Your procedure is scheduled on:  Friday, July 25  Report to [facility] at 10:15 AM.  Call this number if you have problems the morning of surgery: [phone]   Remember:   Do not eat food or drink liquids after midnight. Thursday night   Take these medicines the morning of surgery with A SIP OF WATER: Carvedilol (Coreg),Isosorbide/Hydralazine(Bidil), Zofran, Oxycodone as needed.   Do not wear jewelry, make-up or nail polish.  Do not wear lotions, powders, or perfumes. You may wear deodorant.  Do not shave 48 hours prior to surgery.   Do not bring valuables to the hospital.  [facility] is not responsible  for any belongings or valuables.  Contacts, dentures or bridgework may not be worn into surgery.  Leave suitcase in the car. After surgery it may be brought to your room.  For patients admitted to the hospital, checkout time is 11:00 AM the day of  discharge.   Special Instructions: Shower using CHG 2 nights before surgery and the night before surgery.  If you shower the day of surgery use CHG.  Use special wash - you have one bottle of CHG for all showers.  You should use approximately 1/3 of the bottle for each shower.   Please read over the following fact sheets that you were given: Pain Booklet, Coughing and Deep Breathing, Blood Transfusion Information and Surgical Site Infection Prevention

## 2013-04-09 NOTE — Progress Notes (Addendum)
Anesthesia Chart Review:  Patient is a 37 year old female scheduled for colostomy revision, repair fistula for prolapse on 04/16/13 by Dr. [name].  She missed her PAT appointment today, but has been rescheduled for 04/14/13.    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  She has required four different surgeries since, most recently on 02/03/13 for prolapsed colostomy with obstruction and ischemia. She now requires additional surgery to correct her fistulous tract and repair her prolapse colostomy.  In May 2014 she had an echo as part of a work-up for edema which showed EF 20% with global hypokinesis.  Four days later she required admission for bowel ischemia and required urgent surgery for resection of prolapsed colon.  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 PCP is listed as Dr. Fleet Contras, but she reports that she will be moving her PCP to [facility] (no provider yet established).  Cardiologist is Dr. Royann Shivers.  She was last seen at [facility] on 04/06/13 by Trenda Moots who notes patient's scheduled surgery. She has an echo scheduled for September 2014, and if her EF remains < 35% she will be at least considered for ICD placement.  She has not been compliant with wearing her life vest.  EKG on 03/17/13 showed ST @ 101 bpm, biatrial enlargement, rightward axis, pulmonary disease pattern, LVH with repolarization abnormality (inferolateral T wave abnormality).  Cardiac MRI on 02/16/13 showed: 1) Severe LVE with diffuse hypokinesis and abnormal septal motion EF 26%.  2) No  hyperenhancement or scar tissue in LV myocardium.  3) Moderate LAE with no LAA thrombus and mild RAE.  4) Mild RVE. 5) Tricuspid and mitral regurgitation see echo report for  severity.  6) Normal aorta.  7) No pericardial effusion.  8) No anomalous coronary artery origins.   Echo on 01/30/13 showed: - Left ventricle: The cavity size was moderately dilated. Wall thickness was increased in a pattern of mild LVH. The estimated ejection fraction was 20%. Diffuse hypokinesis. Features are consistent with a pseudonormal left ventricular filling pattern, with concomitant abnormal relaxation and increased filling pressure (grade 2 diastolic dysfunction). - Ventricular septum: Septal motion showed paradox. - Mitral valve: Mild regurgitation. - Right atrium: The atrium was moderately dilated. - Tricuspid valve: Moderate regurgitation. - Pulmonic valve: Moderate regurgitation. - Pulmonary arteries: PA peak pressure: 67mm Hg (S).  CXR on 02/14/13 showed stable enlarged cardiac silhouette. No definite pleural fluid. Normal vasculature. Right PICC tip in the inferior aspect of the superior vena cava. Unremarkable bones.  Labs are pending her PAT visit.  Anesthesiologist Dr. Krista Blue updated on patient's history.  By notes, her cardiologist/PA is aware of plans for surgery.  I'll plan to evaluate her during her PAT visit to ensure no acute changes in her cardiopulmonary status.  Velna Ochs [facility] Short Stay Center/Anesthesiology Phone [phone] 04/09/2013 4:48 PM  Addendum: 04/14/13 4:10 PM I evaluated patient during her PAT visit.  She admits to not being 100% compliant with her life vest.  She was not wearing it today.  I again explained to her the rationale/importance of her wearing the life vest.  She  did report compliance with her medications.  She denied any significant weight changes.  I rechecked her BP today, and it was 134/97. (It was recorded as 120/82 at her [facility] visit on 04/06/13.)   Her renal function has been normal, so I did instruct her to take her lisinopril on the day of surgery as well to help with HTN management. She will also take her isosorbide-hydralazine and carvedilol.  She denies SOB, edema, chest pain, syncope/presyncope. She still has a RUE PICC, but it is only periodically being used for lab draws.  She has not required TPN for ~ 4 weeks, and reports a good appetite.  Dr. [name] has already had her hold her Coumadin.  Exam shows a pleasant black female, in NAD.  Heart RRR, no murmur noted. Lungs clear, no edema. She feels at her baseline--without any new symptoms since her cardiology appointment earlier this month.  Her K is low at 2.9, which I called to [facility] at Dr. Dixon Boos office.  Will defer treatment recommendations to Dr. [name].  I will order an ISTAT for the day of surgery to re-evaluate for hypokalemia.  She is aware that she will need to remain at her baseline for surgery and should contact cardiologist and/or surgeon if any acute changes between now and her scheduled surgery date.

## 2013-04-09 NOTE — Progress Notes (Signed)
Error

## 2013-04-09 NOTE — Telephone Encounter (Signed)
Called patient she states she was having car trouble this am. Advised her to call short stay to reschedule her per op appt . Patient verbalized understanding

## 2013-04-12 ENCOUNTER — Telehealth (INDEPENDENT_AMBULATORY_CARE_PROVIDER_SITE_OTHER): Payer: Self-pay | Admitting: *Deleted

## 2013-04-12 NOTE — Telephone Encounter (Signed)
Dr. [name] sent message to Marcelino Duster that the last refill was intended to last patient until her surgery date.  Patient states she is out of it however because she automatically takes it 2 tablets every 4 hours.  Patient states she will just go to the ED to get more pain medicine.  Patient also informed that she needs to stop taking her Coumadin.  Patient states she has not been told that previously, explained that is fine just stop it now.  Patient agreeable with this plan at this time.

## 2013-04-12 NOTE — Telephone Encounter (Signed)
Patient called to ask for a refill of her pain medication (Percocet 10/325mg  1-2 tablets every 4 hours as needed for pain #60 no refills).  Paged Dr. [name] for approval at this time.

## 2013-04-13 ENCOUNTER — Encounter (INDEPENDENT_AMBULATORY_CARE_PROVIDER_SITE_OTHER): Payer: Self-pay

## 2013-04-14 ENCOUNTER — Encounter (HOSPITAL_COMMUNITY): Payer: Self-pay

## 2013-04-14 ENCOUNTER — Encounter (HOSPITAL_COMMUNITY)
Admission: RE | Admit: 2013-04-14 | Discharge: 2013-04-14 | Disposition: A | Discharge status: Home / Self Care | Payer: Medicaid Other | Source: Ambulatory Visit | Attending: General Surgery | Admitting: General Surgery

## 2013-04-14 ENCOUNTER — Other Ambulatory Visit (INDEPENDENT_AMBULATORY_CARE_PROVIDER_SITE_OTHER): Payer: Self-pay | Admitting: General Surgery

## 2013-04-14 ENCOUNTER — Telehealth (INDEPENDENT_AMBULATORY_CARE_PROVIDER_SITE_OTHER): Payer: Self-pay | Admitting: *Deleted

## 2013-04-14 HISTORY — DX: Heart failure, unspecified: I50.9

## 2013-04-14 HISTORY — DX: Cardiac arrhythmia, unspecified: I49.9

## 2013-04-14 LAB — CBC WITH DIFFERENTIAL/PLATELET
Basophils Absolute: 0.1 10*3/uL (ref 0.0–0.1)
Basophils Relative: 1 % (ref 0–1)
MCHC: 33 g/dL (ref 30.0–36.0)
Monocytes Absolute: 0.5 10*3/uL (ref 0.1–1.0)
Neutro Abs: 3.9 10*3/uL (ref 1.7–7.7)
Neutrophils Relative %: 58 % (ref 43–77)
RDW: 17.6 % — ABNORMAL HIGH (ref 11.5–15.5)

## 2013-04-14 LAB — COMPREHENSIVE METABOLIC PANEL
AST: 14 U/L (ref 0–37)
Albumin: 3.3 g/dL — ABNORMAL LOW (ref 3.5–5.2)
Chloride: 107 mEq/L (ref 96–112)
Creatinine, Ser: 0.66 mg/dL (ref 0.50–1.10)
Potassium: 2.9 mEq/L — ABNORMAL LOW (ref 3.5–5.1)
Total Bilirubin: 0.2 mg/dL — ABNORMAL LOW (ref 0.3–1.2)

## 2013-04-14 LAB — PROTIME-INR: INR: 1.1 (ref 0.00–1.49)

## 2013-04-14 MED ORDER — POTASSIUM CHLORIDE ER 10 MEQ PO TBCR: 20.0000 meq | EXTENDED_RELEASE_TABLET | Freq: Three times a day (TID) | ORAL | Status: DC

## 2013-04-14 NOTE — Progress Notes (Signed)
SPOKE WITH DR. Lavella Lemons NURSE TO CHECK ON BOWEL PREP.  PATIENT STATED SHE WAS SUPPOSED TO USE MAG CITRATE AND ORDER IN EOIC WAS FOR GOLYTELY.  NURSE WILL SEND MSG FOR DR. [name] AND IF ANYTHING DIFFERENT THEY WILL CONTACT PATIENT.  PATIENT WILL DO BOWEL PREP SHE WAS GIVEN UNLESS OTHERWISE INSTRUCTED.  NO PREGNANCY TEST DONE, PATIENT HAS GONE THROUGH PREMENOPAUSE 3 YEARS+ AGO.

## 2013-04-14 NOTE — Telephone Encounter (Signed)
Boneta Lucks with Pre-op called to ask about bowel prep for patient.  She states in the computer it is suppose to be GoLytle however patient states she has instructions to take Mag Citrate for prep.  Paged Dr. [name] at this time to ask.

## 2013-04-14 NOTE — Telephone Encounter (Signed)
Spoke to Dr. [name] who states patient had the correct instructions to do the Mag Citrate and should do that.

## 2013-04-15 ENCOUNTER — Telehealth (INDEPENDENT_AMBULATORY_CARE_PROVIDER_SITE_OTHER): Payer: Self-pay

## 2013-04-15 MED ORDER — CLINDAMYCIN PHOSPHATE 900 MG/50ML IV SOLN
900.0000 mg | INTRAVENOUS | Status: AC
  Administered 2013-04-16: 900 mg via INTRAVENOUS
  Filled 2013-04-15 (×2): qty 50

## 2013-04-15 MED ORDER — GENTAMICIN SULFATE 40 MG/ML IJ SOLN
5.0000 mg/kg | INTRAVENOUS | Status: AC
  Administered 2013-04-16: 280.4 mg via INTRAVENOUS
  Filled 2013-04-15 (×2): qty 7.01

## 2013-04-15 NOTE — Telephone Encounter (Signed)
Message copied by Brennan Bailey on Thu Apr 15, 2013  9:28 AM ------      Message from: Frederik Schmidt      Created: Wed Apr 14, 2013  5:19 PM      Regarding: Preop       Her potassium level ws very low at 2.4 so I ordered some replacement to her pharmacy, but when I tried to call her she did not answer, and the voicemail was full.  Also, did we stop her coumadin?  If not I need to get her some Vitamin K. ------

## 2013-04-15 NOTE — Telephone Encounter (Signed)
Patient returned my call. Her pre op labs came back with low potassium. Dr [name] sent her prescription for potassium chloride to pharmacy for her to start taking. I told her she needs to go pick it up and start taking it.

## 2013-04-15 NOTE — Telephone Encounter (Signed)
LMOM     See below

## 2013-04-16 ENCOUNTER — Encounter (HOSPITAL_COMMUNITY): Payer: Self-pay | Admitting: Critical Care Medicine

## 2013-04-16 ENCOUNTER — Encounter (HOSPITAL_COMMUNITY)
Admission: RE | Disposition: A | Discharge status: Home / Self Care | Payer: Self-pay | Source: Ambulatory Visit | Attending: General Surgery

## 2013-04-16 ENCOUNTER — Ambulatory Visit (HOSPITAL_COMMUNITY): Payer: Medicaid Other | Admitting: Vascular Surgery

## 2013-04-16 ENCOUNTER — Inpatient Hospital Stay (HOSPITAL_COMMUNITY)
Admission: RE | Admit: 2013-04-16 | Discharge: 2013-05-09 | DRG: 329 | Disposition: A | Discharge status: Home / Self Care | Payer: Medicaid Other | Source: Ambulatory Visit | Attending: General Surgery | Admitting: General Surgery

## 2013-04-16 ENCOUNTER — Encounter (HOSPITAL_COMMUNITY): Payer: Self-pay | Admitting: Vascular Surgery

## 2013-04-16 DIAGNOSIS — E876 Hypokalemia: Secondary | ICD-10-CM | POA: Diagnosis not present

## 2013-04-16 DIAGNOSIS — K651 Peritoneal abscess: Secondary | ICD-10-CM | POA: Diagnosis not present

## 2013-04-16 DIAGNOSIS — IMO0002 Reserved for concepts with insufficient information to code with codable children: Secondary | ICD-10-CM

## 2013-04-16 DIAGNOSIS — R600 Localized edema: Secondary | ICD-10-CM

## 2013-04-16 DIAGNOSIS — Z87891 Personal history of nicotine dependence: Secondary | ICD-10-CM

## 2013-04-16 DIAGNOSIS — K929 Disease of digestive system, unspecified: Secondary | ICD-10-CM | POA: Diagnosis not present

## 2013-04-16 DIAGNOSIS — K632 Fistula of intestine: Secondary | ICD-10-CM | POA: Diagnosis present

## 2013-04-16 DIAGNOSIS — Z9049 Acquired absence of other specified parts of digestive tract: Secondary | ICD-10-CM

## 2013-04-16 DIAGNOSIS — Z9189 Other specified personal risk factors, not elsewhere classified: Secondary | ICD-10-CM

## 2013-04-16 DIAGNOSIS — D696 Thrombocytopenia, unspecified: Secondary | ICD-10-CM

## 2013-04-16 DIAGNOSIS — K9409 Other complications of colostomy: Secondary | ICD-10-CM | POA: Diagnosis present

## 2013-04-16 DIAGNOSIS — I509 Heart failure, unspecified: Secondary | ICD-10-CM | POA: Diagnosis present

## 2013-04-16 DIAGNOSIS — K520 Gastroenteritis and colitis due to radiation: Secondary | ICD-10-CM | POA: Diagnosis present

## 2013-04-16 DIAGNOSIS — I959 Hypotension, unspecified: Secondary | ICD-10-CM | POA: Diagnosis not present

## 2013-04-16 DIAGNOSIS — F1210 Cannabis abuse, uncomplicated: Secondary | ICD-10-CM | POA: Diagnosis present

## 2013-04-16 DIAGNOSIS — T8130XA Disruption of wound, unspecified, initial encounter: Secondary | ICD-10-CM | POA: Diagnosis not present

## 2013-04-16 DIAGNOSIS — K660 Peritoneal adhesions (postprocedural) (postinfection): Secondary | ICD-10-CM | POA: Diagnosis present

## 2013-04-16 DIAGNOSIS — D62 Acute posthemorrhagic anemia: Secondary | ICD-10-CM | POA: Diagnosis not present

## 2013-04-16 DIAGNOSIS — I10 Essential (primary) hypertension: Secondary | ICD-10-CM | POA: Diagnosis present

## 2013-04-16 DIAGNOSIS — I428 Other cardiomyopathies: Secondary | ICD-10-CM | POA: Diagnosis present

## 2013-04-16 DIAGNOSIS — Y842 Radiological procedure and radiotherapy as the cause of abnormal reaction of the patient, or of later complication, without mention of misadventure at the time of the procedure: Secondary | ICD-10-CM | POA: Diagnosis present

## 2013-04-16 DIAGNOSIS — Z7901 Long term (current) use of anticoagulants: Secondary | ICD-10-CM

## 2013-04-16 DIAGNOSIS — K560 Paralytic ileus: Secondary | ICD-10-CM | POA: Diagnosis not present

## 2013-04-16 DIAGNOSIS — I5042 Chronic combined systolic (congestive) and diastolic (congestive) heart failure: Secondary | ICD-10-CM | POA: Diagnosis present

## 2013-04-16 DIAGNOSIS — C539 Malignant neoplasm of cervix uteri, unspecified: Secondary | ICD-10-CM

## 2013-04-16 DIAGNOSIS — E46 Unspecified protein-calorie malnutrition: Secondary | ICD-10-CM | POA: Diagnosis present

## 2013-04-16 HISTORY — PX: COLOSTOMY REVISION: SHX5232

## 2013-04-16 HISTORY — PX: LAPAROTOMY: SHX154

## 2013-04-16 HISTORY — PX: APPENDECTOMY: SHX54

## 2013-04-16 LAB — CBC WITH DIFFERENTIAL/PLATELET
Basophils Absolute: 0 10*3/uL (ref 0.0–0.1)
Basophils Relative: 0 % (ref 0–1)
HCT: 27.3 % — ABNORMAL LOW (ref 36.0–46.0)
Hemoglobin: 8.9 g/dL — ABNORMAL LOW (ref 12.0–15.0)
Lymphocytes Relative: 19 % (ref 12–46)
MCHC: 32.6 g/dL (ref 30.0–36.0)
Monocytes Absolute: 0.3 10*3/uL (ref 0.1–1.0)
Monocytes Relative: 5 % (ref 3–12)
Neutro Abs: 4.3 10*3/uL (ref 1.7–7.7)
Neutrophils Relative %: 75 % (ref 43–77)
RDW: 17.7 % — ABNORMAL HIGH (ref 11.5–15.5)
WBC: 5.7 10*3/uL (ref 4.0–10.5)

## 2013-04-16 LAB — BASIC METABOLIC PANEL
Chloride: 108 mEq/L (ref 96–112)
Chloride: 107 mEq/L (ref 96–112)
Creatinine, Ser: 0.82 mg/dL (ref 0.50–1.10)
GFR calc Af Amer: >90 mL/min (ref >90)
GFR calc Af Amer: >90 mL/min (ref >90)
GFR calc non Af Amer: >90 mL/min (ref >90)
GFR calc non Af Amer: >90 mL/min (ref >90)
Potassium: 2.6 mEq/L — CL (ref 3.5–5.1)

## 2013-04-16 LAB — POCT I-STAT 4, (NA,K, GLUC, HGB,HCT)
Glucose, Bld: 94 mg/dL (ref 70–99)
Glucose, Bld: 111 mg/dL — ABNORMAL HIGH (ref 70–99)
HCT: 24 % — ABNORMAL LOW (ref 36.0–46.0)
Hemoglobin: 8.2 g/dL — ABNORMAL LOW (ref 12.0–15.0)
Potassium: 2.8 mEq/L — ABNORMAL LOW (ref 3.5–5.1)
Sodium: 142 mEq/L (ref 135–145)

## 2013-04-16 LAB — MRSA PCR SCREENING: MRSA by PCR: NEGATIVE

## 2013-04-16 SURGERY — REVISION, COLOSTOMY
Anesthesia: General | Site: Abdomen | Wound class: Dirty or Infected

## 2013-04-16 MED ORDER — ROCURONIUM BROMIDE 100 MG/10ML IV SOLN
INTRAVENOUS | Status: DC | PRN
  Administered 2013-04-16 (×2): 10 mg via INTRAVENOUS
  Administered 2013-04-16: 40 mg via INTRAVENOUS

## 2013-04-16 MED ORDER — POTASSIUM CHLORIDE 10 MEQ/50ML IV SOLN
INTRAVENOUS | Status: AC
  Administered 2013-04-16: 10 meq
  Filled 2013-04-16: qty 200

## 2013-04-16 MED ORDER — METOPROLOL TARTRATE 1 MG/ML IV SOLN
5.0000 mg | Freq: Four times a day (QID) | INTRAVENOUS | Status: DC | PRN
  Administered 2013-04-22: 5 mg via INTRAVENOUS
  Filled 2013-04-16: qty 5

## 2013-04-16 MED ORDER — ONDANSETRON HCL 4 MG/2ML IJ SOLN: 4.0000 mg | Freq: Four times a day (QID) | INTRAMUSCULAR | Status: DC | PRN

## 2013-04-16 MED ORDER — GLYCOPYRROLATE 0.2 MG/ML IJ SOLN
INTRAMUSCULAR | Status: DC | PRN
  Administered 2013-04-16: 0.6 mg via INTRAVENOUS

## 2013-04-16 MED ORDER — DIPHENHYDRAMINE HCL 12.5 MG/5ML PO ELIX
12.5000 mg | ORAL_SOLUTION | Freq: Four times a day (QID) | ORAL | Status: DC | PRN
  Filled 2013-04-16: qty 5

## 2013-04-16 MED ORDER — LACTATED RINGERS IV SOLN
INTRAVENOUS | Status: DC | PRN
  Administered 2013-04-16: 12:00:00 via INTRAVENOUS

## 2013-04-16 MED ORDER — MIDAZOLAM HCL 5 MG/5ML IJ SOLN
INTRAMUSCULAR | Status: DC | PRN
  Administered 2013-04-16: 2 mg via INTRAVENOUS

## 2013-04-16 MED ORDER — HYDROMORPHONE 0.3 MG/ML IV SOLN
INTRAVENOUS | Status: DC
  Administered 2013-04-16: 1.8 mg via INTRAVENOUS
  Administered 2013-04-17: 0.6 mg via INTRAVENOUS
  Administered 2013-04-17: 3 mg via INTRAVENOUS
  Administered 2013-04-17: 2.2 mg via INTRAVENOUS
  Administered 2013-04-17: 1.8 mg via INTRAVENOUS
  Administered 2013-04-17: 2.57 mg via INTRAVENOUS
  Administered 2013-04-17: 0.9 mg via INTRAVENOUS
  Administered 2013-04-18: 1.6 mg via INTRAVENOUS
  Administered 2013-04-18: 1.2 mg via INTRAVENOUS
  Administered 2013-04-18: 0.3 mg via INTRAVENOUS
  Administered 2013-04-18: 2.7 mg via INTRAVENOUS
  Administered 2013-04-18: 1.2 mg via INTRAVENOUS
  Administered 2013-04-18: 3.6 mg via INTRAVENOUS
  Administered 2013-04-19: 12:00:00 via INTRAVENOUS
  Administered 2013-04-19: 2.7 mg via INTRAVENOUS
  Administered 2013-04-19: 2.4 mg via INTRAVENOUS
  Administered 2013-04-19: 1.2 mg via INTRAVENOUS
  Administered 2013-04-19: 1.8 mg via INTRAVENOUS
  Administered 2013-04-19: 2.4 mg via INTRAVENOUS
  Administered 2013-04-20: 4.2 mg via INTRAVENOUS
  Administered 2013-04-20: 1.2 mg via INTRAVENOUS
  Administered 2013-04-20: 17:00:00 via INTRAVENOUS
  Administered 2013-04-20 (×2): 3 mg via INTRAVENOUS
  Administered 2013-04-20: 3.6 mg via INTRAVENOUS
  Administered 2013-04-20: 3.3 mg via INTRAVENOUS
  Administered 2013-04-21 (×2): via INTRAVENOUS
  Administered 2013-04-21: 1.95 mg via INTRAVENOUS
  Administered 2013-04-21: 3.9 mg via INTRAVENOUS
  Administered 2013-04-21: 6 mg via INTRAVENOUS
  Administered 2013-04-21: 2.9 mg via INTRAVENOUS
  Administered 2013-04-21 – 2013-04-22 (×2): via INTRAVENOUS
  Administered 2013-04-22: 2.1 mg via INTRAVENOUS
  Filled 2013-04-16 (×12): qty 25

## 2013-04-16 MED ORDER — SPIRONOLACTONE 12.5 MG HALF TABLET
12.5000 mg | ORAL_TABLET | Freq: Every day | ORAL | Status: DC
  Administered 2013-04-17 – 2013-05-06 (×17): 12.5 mg via ORAL
  Filled 2013-04-16 (×22): qty 1

## 2013-04-16 MED ORDER — OXYCODONE HCL 5 MG/5ML PO SOLN: 5.0000 mg | Freq: Once | ORAL | Status: DC | PRN

## 2013-04-16 MED ORDER — ONDANSETRON HCL 4 MG/2ML IJ SOLN
INTRAMUSCULAR | Status: DC | PRN
  Administered 2013-04-16 (×2): 4 mg via INTRAVENOUS

## 2013-04-16 MED ORDER — DIPHENHYDRAMINE HCL 50 MG/ML IJ SOLN
25.0000 mg | INTRAMUSCULAR | Status: DC | PRN
  Administered 2013-04-16 – 2013-04-22 (×18): 25 mg via INTRAVENOUS
  Filled 2013-04-16 (×19): qty 1

## 2013-04-16 MED ORDER — KCL IN DEXTROSE-NACL 20-5-0.45 MEQ/L-%-% IV SOLN
INTRAVENOUS | Status: AC
  Administered 2013-04-16: 125 mL/h via INTRAVENOUS
  Administered 2013-04-17 – 2013-04-19 (×5): via INTRAVENOUS
  Filled 2013-04-16 (×11): qty 1000

## 2013-04-16 MED ORDER — POTASSIUM CHLORIDE 10 MEQ/50ML IV SOLN
10.0000 meq | INTRAVENOUS | Status: AC
  Administered 2013-04-16 (×3): 10 meq via INTRAVENOUS

## 2013-04-16 MED ORDER — NEOSTIGMINE METHYLSULFATE 1 MG/ML IJ SOLN
INTRAMUSCULAR | Status: DC | PRN
  Administered 2013-04-16: 4 mg via INTRAVENOUS

## 2013-04-16 MED ORDER — SODIUM CHLORIDE 0.9 % IJ SOLN
9.0000 mL | INTRAMUSCULAR | Status: DC | PRN
  Administered 2013-04-26 – 2013-05-03 (×2): via INTRAVENOUS

## 2013-04-16 MED ORDER — CLINDAMYCIN PHOSPHATE 600 MG/50ML IV SOLN
INTRAVENOUS | Status: AC
  Filled 2013-04-16: qty 50

## 2013-04-16 MED ORDER — DEXAMETHASONE SODIUM PHOSPHATE 4 MG/ML IJ SOLN
INTRAMUSCULAR | Status: DC | PRN
  Administered 2013-04-16: 4 mg via INTRAVENOUS

## 2013-04-16 MED ORDER — CLINDAMYCIN PHOSPHATE 900 MG/50ML IV SOLN
900.0000 mg | Freq: Three times a day (TID) | INTRAVENOUS | Status: AC
  Administered 2013-04-16: 900 mg via INTRAVENOUS
  Filled 2013-04-16: qty 50

## 2013-04-16 MED ORDER — LACTATED RINGERS IV SOLN
INTRAVENOUS | Status: DC | PRN
  Administered 2013-04-16 (×2): via INTRAVENOUS

## 2013-04-16 MED ORDER — FENTANYL CITRATE 0.05 MG/ML IJ SOLN
INTRAMUSCULAR | Status: DC | PRN
  Administered 2013-04-16: 100 ug via INTRAVENOUS
  Administered 2013-04-16 (×5): 50 ug via INTRAVENOUS

## 2013-04-16 MED ORDER — PROPOFOL 10 MG/ML IV BOLUS
INTRAVENOUS | Status: DC | PRN
  Administered 2013-04-16: 70 mg via INTRAVENOUS

## 2013-04-16 MED ORDER — HYDROMORPHONE 0.3 MG/ML IV SOLN
INTRAVENOUS | Status: AC
  Filled 2013-04-16: qty 25

## 2013-04-16 MED ORDER — 0.9 % SODIUM CHLORIDE (POUR BTL) OPTIME
TOPICAL | Status: DC | PRN
  Administered 2013-04-16 (×2): 2000 mL

## 2013-04-16 MED ORDER — HEPARIN SODIUM (PORCINE) 5000 UNIT/ML IJ SOLN
5000.0000 [IU] | Freq: Three times a day (TID) | INTRAMUSCULAR | Status: DC
  Administered 2013-04-17 – 2013-04-29 (×35): 5000 [IU] via SUBCUTANEOUS
  Filled 2013-04-16 (×49): qty 1

## 2013-04-16 MED ORDER — NALOXONE HCL 0.4 MG/ML IJ SOLN: 0.4000 mg | INTRAMUSCULAR | Status: DC | PRN

## 2013-04-16 MED ORDER — DIPHENHYDRAMINE HCL 50 MG/ML IJ SOLN
12.5000 mg | Freq: Four times a day (QID) | INTRAMUSCULAR | Status: DC | PRN
  Filled 2013-04-16: qty 1

## 2013-04-16 MED ORDER — HYDROMORPHONE HCL PF 1 MG/ML IJ SOLN
INTRAMUSCULAR | Status: AC
  Administered 2013-04-16: 0.5 mg via INTRAVENOUS
  Filled 2013-04-16: qty 2

## 2013-04-16 MED ORDER — HYDROMORPHONE HCL PF 1 MG/ML IJ SOLN
0.2500 mg | INTRAMUSCULAR | Status: DC | PRN
  Administered 2013-04-16 (×3): 0.5 mg via INTRAVENOUS

## 2013-04-16 MED ORDER — ISOSORB DINITRATE-HYDRALAZINE 20-37.5 MG PO TABS
1.0000 | ORAL_TABLET | Freq: Two times a day (BID) | ORAL | Status: DC
  Administered 2013-04-17 – 2013-05-08 (×32): 1 via ORAL
  Filled 2013-04-16 (×50): qty 1

## 2013-04-16 MED ORDER — ALBUMIN HUMAN 5 % IV SOLN
INTRAVENOUS | Status: DC | PRN
  Administered 2013-04-16: 13:00:00 via INTRAVENOUS

## 2013-04-16 MED ORDER — ARTIFICIAL TEARS OP OINT
TOPICAL_OINTMENT | OPHTHALMIC | Status: DC | PRN
  Administered 2013-04-16: 1 via OPHTHALMIC

## 2013-04-16 MED ORDER — PEG 3350-KCL-NA BICARB-NACL 420 G PO SOLR
4000.0000 mL | Freq: Once | ORAL | Status: DC
  Filled 2013-04-16: qty 4000

## 2013-04-16 MED ORDER — PHENYLEPHRINE HCL 10 MG/ML IJ SOLN
10.0000 mg | INTRAVENOUS | Status: DC | PRN
  Administered 2013-04-16: 10 ug/min via INTRAVENOUS

## 2013-04-16 MED ORDER — OXYCODONE HCL 5 MG PO TABS: 5.0000 mg | ORAL_TABLET | Freq: Once | ORAL | Status: DC | PRN

## 2013-04-16 MED ORDER — ONDANSETRON HCL 4 MG/2ML IJ SOLN
4.0000 mg | Freq: Four times a day (QID) | INTRAMUSCULAR | Status: DC | PRN
  Administered 2013-04-18 – 2013-04-24 (×10): 4 mg via INTRAVENOUS
  Filled 2013-04-16 (×10): qty 2

## 2013-04-16 MED ORDER — LISINOPRIL 40 MG PO TABS
40.0000 mg | ORAL_TABLET | Freq: Every day | ORAL | Status: DC
  Administered 2013-04-17 – 2013-05-06 (×17): 40 mg via ORAL
  Filled 2013-04-16 (×23): qty 1

## 2013-04-16 SURGICAL SUPPLY — 81 items
BLADE SURG 10 STRL SS (BLADE) ×4 IMPLANT
BLADE SURG 15 STRL LF DISP TIS (BLADE) ×2 IMPLANT
BLADE SURG 15 STRL SS (BLADE) ×2
BLADE SURG ROTATE 9660 (MISCELLANEOUS) IMPLANT
CANISTER SUCTION 2500CC (MISCELLANEOUS) ×2 IMPLANT
CLOTH BEACON ORANGE TIMEOUT ST (SAFETY) ×2 IMPLANT
COVER MAYO STAND STRL (DRAPES) ×2 IMPLANT
COVER SURGICAL LIGHT HANDLE (MISCELLANEOUS) ×2 IMPLANT
DRAIN CHANNEL 19F RND (DRAIN) ×2 IMPLANT
DRAPE LAPAROSCOPIC ABDOMINAL (DRAPES) ×2 IMPLANT
DRAPE PROXIMA HALF (DRAPES) IMPLANT
DRAPE UTILITY 15X26 W/TAPE STR (DRAPE) ×6 IMPLANT
DRAPE WARM FLUID 44X44 (DRAPE) ×2 IMPLANT
DRESSING TELFA 8X3 (GAUZE/BANDAGES/DRESSINGS) ×2 IMPLANT
DRSG OPSITE POSTOP 4X10 (GAUZE/BANDAGES/DRESSINGS) ×2 IMPLANT
ELECT CAUTERY BLADE 6.4 (BLADE) ×2 IMPLANT
ELECT REM PT RETURN 9FT ADLT (ELECTROSURGICAL) ×2
ELECTRODE REM PT RTRN 9FT ADLT (ELECTROSURGICAL) ×1 IMPLANT
EVACUATOR SILICONE 100CC (DRAIN) ×2 IMPLANT
GLOVE BIO SURGEON STRL SZ8 (GLOVE) ×8 IMPLANT
GLOVE BIOGEL PI IND STRL 6.5 (GLOVE) ×5 IMPLANT
GLOVE BIOGEL PI IND STRL 8 (GLOVE) ×4 IMPLANT
GLOVE BIOGEL PI INDICATOR 6.5 (GLOVE) ×5
GLOVE BIOGEL PI INDICATOR 8 (GLOVE) ×4
GLOVE ECLIPSE 6.5 STRL STRAW (GLOVE) ×6 IMPLANT
GLOVE ECLIPSE 7.5 STRL STRAW (GLOVE) ×6 IMPLANT
GLOVE SURG SS PI 6.5 STRL IVOR (GLOVE) ×2 IMPLANT
GOWN STRL NON-REIN LRG LVL3 (GOWN DISPOSABLE) ×10 IMPLANT
GOWN STRL REIN XL XLG (GOWN DISPOSABLE) ×6 IMPLANT
KIT BASIN OR (CUSTOM PROCEDURE TRAY) ×2 IMPLANT
KIT OSTOMY DRAINABLE 2.75 STR (WOUND CARE) ×2 IMPLANT
KIT ROOM TURNOVER OR (KITS) ×2 IMPLANT
LEGGING LITHOTOMY PAIR STRL (DRAPES) IMPLANT
LIGASURE IMPACT 36 18CM CVD LR (INSTRUMENTS) ×2 IMPLANT
NS IRRIG 1000ML POUR BTL (IV SOLUTION) ×8 IMPLANT
PACK GENERAL/GYN (CUSTOM PROCEDURE TRAY) ×2 IMPLANT
PAD ARMBOARD 7.5X6 YLW CONV (MISCELLANEOUS) ×4 IMPLANT
PAD ELECT DEFIB RADIOL ZOLL (MISCELLANEOUS) ×2 IMPLANT
PAD ONESTEP ZOLL R SERIES ADT (MISCELLANEOUS) ×2 IMPLANT
PAD SHARPS MAGNETIC DISPOSAL (MISCELLANEOUS) ×2 IMPLANT
PENCIL BUTTON HOLSTER BLD 10FT (ELECTRODE) ×2 IMPLANT
RELOAD AUTO 90-3.5 TA90 BLE (ENDOMECHANICALS) ×2 IMPLANT
RELOAD PROXIMATE 75MM BLUE (ENDOMECHANICALS) ×2 IMPLANT
RELOAD STAPLER LINEAR PROX 30 (STAPLE) ×1 IMPLANT
SEPRAFILM PROCEDURAL PACK 3X5 (MISCELLANEOUS) ×2 IMPLANT
SPECIMEN JAR MEDIUM (MISCELLANEOUS) IMPLANT
SPONGE GAUZE 4X4 12PLY (GAUZE/BANDAGES/DRESSINGS) ×2 IMPLANT
SPONGE LAP 18X18 X RAY DECT (DISPOSABLE) ×6 IMPLANT
STAPLER 90 3.5 STAND SLIM (STAPLE) ×2
STAPLER 90 3.5 STD SLIM (STAPLE) ×1 IMPLANT
STAPLER PROXIMATE 75MM BLUE (STAPLE) ×2 IMPLANT
STAPLER RELOAD LINEAR PROX 30 (STAPLE) ×2
STAPLER VISISTAT 35W (STAPLE) ×2 IMPLANT
SUCTION POOLE TIP (SUCTIONS) IMPLANT
SUT ETHILON 2 0 PSLX (SUTURE) ×2 IMPLANT
SUT NOVA NAB DX-16 0-1 5-0 T12 (SUTURE) ×6 IMPLANT
SUT PDS AB 1 TP1 54 (SUTURE) IMPLANT
SUT PDS AB 1 TP1 96 (SUTURE) IMPLANT
SUT SILK 2 0 SH (SUTURE) ×4 IMPLANT
SUT SILK 2 0 SH CR/8 (SUTURE) ×2 IMPLANT
SUT SILK 2 0 TIES 10X30 (SUTURE) ×2 IMPLANT
SUT SILK 3 0 SH CR/8 (SUTURE) ×4 IMPLANT
SUT SILK 3 0 TIES 10X30 (SUTURE) ×2 IMPLANT
SUT VIC AB 1 CT1 18XCR BRD 8 (SUTURE) IMPLANT
SUT VIC AB 1 CT1 8-18 (SUTURE)
SUT VIC AB 2-0 CT1 27 (SUTURE)
SUT VIC AB 2-0 CT1 TAPERPNT 27 (SUTURE) IMPLANT
SUT VIC AB 2-0 SH 18 (SUTURE) ×2 IMPLANT
SUT VIC AB 3-0 54X BRD REEL (SUTURE) IMPLANT
SUT VIC AB 3-0 BRD 54 (SUTURE)
SUT VIC AB 3-0 SH 27 (SUTURE)
SUT VIC AB 3-0 SH 27X BRD (SUTURE) IMPLANT
SUT VIC AB 3-0 SH 8-18 (SUTURE) ×2 IMPLANT
SYR BULB IRRIGATION 50ML (SYRINGE) ×2 IMPLANT
TAPE CLOTH SURG 4X10 WHT LF (GAUZE/BANDAGES/DRESSINGS) ×2 IMPLANT
TOWEL OR 17X24 6PK STRL BLUE (TOWEL DISPOSABLE) ×2 IMPLANT
TOWEL OR 17X26 10 PK STRL BLUE (TOWEL DISPOSABLE) ×4 IMPLANT
TRAY FOLEY CATH 14FRSI W/METER (CATHETERS) ×2 IMPLANT
TUBE CONNECTING 12X1/4 (SUCTIONS) ×2 IMPLANT
WATER STERILE IRR 1000ML POUR (IV SOLUTION) ×2 IMPLANT
YANKAUER SUCT BULB TIP NO VENT (SUCTIONS) ×4 IMPLANT

## 2013-04-16 NOTE — Progress Notes (Signed)
Pt complains of itching.  12.5mg  IV benadryl given per order.  Will continue to monitor pt.

## 2013-04-16 NOTE — Progress Notes (Signed)
Pt continues to itch and remove monitoring equipment.  Pt continually reminded to leave equipment in place.  Cool cloth given to pt to help calm itching skin.  Will continue to monitor.

## 2013-04-16 NOTE — H&P (Signed)
  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  She has developed a number of complications including CHF, DVT, fistulization from her prior surgery along with prolapse of her stoma which was revised.  She comes in today for correction of her fistula, revision of her colostomy, and hopefully her last operation to correct these problems.  The plan is to take take the patient to surgery on July 25 to explore her and correct her fistulous tract and repair her prolapse colostomy I will be on trauma call but will seek for backup for the procedure the patient will get a bowel prep prior to leaving today. No antibiotics are necessary outside of perioperative IV antibiotics.    Marta Lamas. [name], MD, FACS [phone] [facility] Surgery

## 2013-04-16 NOTE — Progress Notes (Signed)
Pt continues to itch excessivly.  Pt removing SCDs, BP cuff, gown and monitoring equipment.  Paged Dr. Corliss Skains.  Received order to change benadryl order to 25mg  IV Q4 hour PRN for itching.  Additional 12.5 mg IV benadryl given. SCDs, BF cuff, gown and monitoring equipment replaced.   Will continue to monitor pt.

## 2013-04-16 NOTE — Anesthesia Postprocedure Evaluation (Signed)
  Anesthesia Post-op Note  Patient: Vanessa [name]  Procedure(s) Performed: Procedure(s): COLOSTOMY REVISION, REPAIR COLOSTOMY FISTULA (N/A) EXPLORATORY LAPAROTOMY,Partial Colectomy,Small bowel resection (N/A) APPENDECTOMY (N/A)  Patient Location: PACU  Anesthesia Type:General  Level of Consciousness: awake, alert  and oriented  Airway and Oxygen Therapy: Patient Spontanous Breathing and Patient connected to nasal cannula oxygen  Post-op Pain: mild  Post-op Assessment: Post-op Vital signs reviewed, Patient's Cardiovascular Status Stable, Respiratory Function Stable and Patent Airway  Post-op Vital Signs: stable  Complications: No apparent anesthesia complications

## 2013-04-16 NOTE — Anesthesia Preprocedure Evaluation (Signed)
Anesthesia Evaluation  Patient identified by MRN, date of birth, ID band Patient awake    Reviewed: Allergy & Precautions, H&P , NPO status , Patient's Chart, lab work & pertinent test results  Airway Mallampati: II  Neck ROM: full    Dental   Pulmonary former smoker,          Cardiovascular hypertension, + Peripheral Vascular Disease and +CHF + dysrhythmias     Neuro/Psych    GI/Hepatic   Endo/Other    Renal/GU      Musculoskeletal   Abdominal   Peds  Hematology   Anesthesia Other Findings   Reproductive/Obstetrics                           Anesthesia Physical Anesthesia Plan  ASA: III  Anesthesia Plan: General   Post-op Pain Management:    Induction: Intravenous  Airway Management Planned: Oral ETT  Additional Equipment:   Intra-op Plan:   Post-operative Plan: Extubation in OR  Informed Consent: I have reviewed the patients History and Physical, chart, labs and discussed the procedure including the risks, benefits and alternatives for the proposed anesthesia with the patient or authorized representative who has indicated his/her understanding and acceptance.     Plan Discussed with: CRNA, Anesthesiologist and Surgeon  Anesthesia Plan Comments:         Anesthesia Quick Evaluation

## 2013-04-16 NOTE — Op Note (Signed)
OPERATIVE REPORT  DATE OF OPERATION: 04/16/2013  PATIENT:  Vanessa [name]  37 y.o. 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  SURGEON:  Surgeon(s): Cherylynn Ridges, MD Ardeth Sportsman, MD  ASSISTANT: Michaell Cowing  ANESTHESIA:   general  EBL: 350 ml  BLOOD ADMINISTERED: none  DRAINS: Nasogastric Tube, Urinary Catheter (Foley) and (one) Blake drain(s) in the pelvis exiting the right lower wuadrant   SPECIMEN:  Source of Specimen:  Resected transverse colon, fistulous tract  COUNTS CORRECT:  YES  PROCEDURE DETAILS: The patient was taken to the operating room and placed on the table in the supine position. After an adequate general endotracheal anesthetic was administered a proper time out was performed identifying the patient and the procedure to be performed, her prolapsing left-sided colostomy was ligated using double strand of 2-0 silk, and she was prepped and draped in usual sterile manner.  A second time out was performed identifying the patient and procedure be performed. We made a midline incision from above the level of the patient's previous scarring down through the midline scarring in the lower mid abdomen. It was taken down to and through the midline fascia. In spite of being able to get into a clear space in the upper abdomen, as we got into the lower portion of the scar tissue there was dense adherence of small bowel to the scar tissue of the fascia. In trying to release the small bowel an enterotomy was made which was subsequently repaired using full thickness stitches of 3-0 silk.  The patient had a left upper quadrant and flank drain had been placed percutaneously several months ago. Part of the reason for  this operation was to be able to remove the drain permanently. Extensive time  was used taking down small bowel adhesions. This added an hour and a  half to the operation.  Other serosal injuries were made however no other full-thickness enterotomies were made during this process. In the left abdomen laterally and posteriorly the fistulous tract which had been associated with the percutaneous drain was identified. We were able to incise into the fistulous tract and subsequently removed the drain from the flank and also from the abdominal cavity. We subsequently excised the fistulous tract and sent as a separate specimen. Care was taken not to go deep and lateral to that involving the ureter.  Once the fistulous tract was removed we spent a significant amount of time identifying the adherence of small bowel to the pelvic floor likely causing the Introl colonic fistula from the right side. This fistulous tract was identified and opening of small bowel which was noted terminal ileum into the rectal stump was identified. In order to adequately mobilize this piece of small bowel the right colon had been mobilized and then returned the appendix which was tethered to the lateral wall was identified. We went ahead and did an appendectomy using a TA 30 stapler and using a LigaSure device on the mesoappendix.  We completed the mobilization of the enterocolonic fistula and the small bowel attached to it and subsequently did a small bowel resection using a GIA 75 stapler. The mesentery was closed using interrupted 2-0 silk sutures. To close the enterotomy a TA 90 stapler was used however it misfired on initial attempt. Subsequently had to far the TA 90 again closing the resulting  enterotomy.  Once the anastomosis was completed we went ahead and revise the colostomy on the left upper quadrant. The patient had prolapse which been there for a while. She has significant amount of redundant colon and also a loose colostomy  site. In order to repair this we did a partial colectomy of the redundant transverse colon using a GIA-75 stapler. The size at the colostomy site was closed down using interrupted #1 Novafil sutures. We subsequently were able to bring out the colon off the left side and matured using 3-0 Vicryl pop-off sutures. This was done after closing the abdomen.  So all in all we did an extensive enterocleisis, a transverse colon resection, an appendectomy, a small bowel resection of the enterocolonic fistula, and removal of the fistulous tract from the left side associated with a previous percutaneous drain.  An NG tube was left in place. We irrigated with 3 L of saline solution. We closed the abdomen using interrupted figure-of-eight stitches of #1 Novafil. The skin was partially closed with intervening wicks of Telfa. A sterile dressing was applied. The colostomy was matured as previously described. All needle counts, sponge counts, and instrument counts were correct.  PATIENT DISPOSITION:  PACU - guarded condition.   Cherylynn Ridges 7/25/20143:52 PM

## 2013-04-16 NOTE — Transfer of Care (Signed)
Immediate Anesthesia Transfer of Care Note  Patient: Vanessa [name]  Procedure(s) Performed: Procedure(s): COLOSTOMY REVISION, REPAIR COLOSTOMY FISTULA (N/A) EXPLORATORY LAPAROTOMY,Partial Colectomy,Small bowel resection (N/A) APPENDECTOMY (N/A)  Patient Location: PACU  Anesthesia Type:General  Level of Consciousness: awake, alert  and oriented  Airway & Oxygen Therapy: Patient Spontanous Breathing and Patient connected to nasal cannula oxygen  Post-op Assessment: Report given to PACU RN, Post -op Vital signs reviewed and stable and Patient moving all extremities X 4  Post vital signs: Reviewed and stable  Complications: No apparent anesthesia complications

## 2013-04-16 NOTE — Preoperative (Signed)
Beta Blockers   Reason not to administer Beta Blockers:Not Applicable, pt took coreg 7/25

## 2013-04-16 NOTE — Anesthesia Procedure Notes (Signed)
Procedure Name: Intubation Date/Time: 04/16/2013 12:16 PM Performed by: Elon Alas Pre-anesthesia Checklist: Patient identified, Timeout performed, Emergency Drugs available, Suction available and Patient being monitored Patient Re-evaluated:Patient Re-evaluated prior to inductionOxygen Delivery Method: Circle system utilized Preoxygenation: Pre-oxygenation with 100% oxygen Intubation Type: IV induction Ventilation: Mask ventilation without difficulty Laryngoscope Size: 4 and Mac Grade View: Grade II Tube type: Oral Tube size: 7.5 mm Number of attempts: 1 Airway Equipment and Method: Stylet Placement Confirmation: positive ETCO2,  ETT inserted through vocal cords under direct vision and breath sounds checked- equal and bilateral Secured at: 22 cm Tube secured with: Tape Dental Injury: Teeth and Oropharynx as per pre-operative assessment

## 2013-04-17 ENCOUNTER — Encounter (HOSPITAL_COMMUNITY): Payer: Self-pay | Admitting: *Deleted

## 2013-04-17 LAB — CBC
MCH: 27.3 pg (ref 26.0–34.0)
Platelets: 336 10*3/uL (ref 150–400)
RBC: 3.3 MIL/uL — ABNORMAL LOW (ref 3.87–5.11)
RDW: 17.4 % — ABNORMAL HIGH (ref 11.5–15.5)
WBC: 14.2 10*3/uL — ABNORMAL HIGH (ref 4.0–10.5)

## 2013-04-17 LAB — BASIC METABOLIC PANEL
Calcium: 9.5 mg/dL (ref 8.4–10.5)
GFR calc non Af Amer: 84 mL/min — ABNORMAL LOW (ref >90)
Sodium: 140 mEq/L (ref 135–145)

## 2013-04-17 MED ORDER — PNEUMOCOCCAL VAC POLYVALENT 25 MCG/0.5ML IJ INJ
0.5000 mL | INJECTION | INTRAMUSCULAR | Status: AC
  Administered 2013-04-18: 0.5 mL via INTRAMUSCULAR
  Filled 2013-04-17: qty 0.5

## 2013-04-17 NOTE — Progress Notes (Signed)
Pt continues to itch and scratch.  Lotion with lavender oil added applied to pt skin to help calm itching.  Pt removed SCDs and refuses to wear them.  Re-educated pt on the importance of wearing them but pt still refuses.  Will continue to monitor pt.

## 2013-04-17 NOTE — Progress Notes (Signed)
1 Day Post-Op  Subjective: Fairly comfortable  Objective: Vital signs in last 24 hours: Temp:  [97.5 F (36.4 C)-98.2 F (36.8 C)] 98.2 F (36.8 C) (07/26 0805) Pulse Rate:  [50-62] 55 (07/26 0700) Resp:  [9-19] 14 (07/26 0700) BP: (91-123)/(56-83) 117/73 mmHg (07/26 0700) SpO2:  [95 %-100 %] 100 % (07/26 0700) Arterial Line BP: (87-132)/(56-102) 128/72 mmHg (07/26 0700) Weight:  [132 lb 7.9 oz (60.1 kg)] 132 lb 7.9 oz (60.1 kg) (07/26 0500)    Intake/Output from previous day: 07/25 0701 - 07/26 0700 In: 4161.8 [I.V.:3651.8; NG/GT:60; IV Piggyback:450] Out: 2160 [Urine:830; Emesis/NG output:750; Drains:280; Blood:300] Intake/Output this shift:    Lungs clear CV RRR Abdomen soft, ostomy pink, drain serosang  Lab Results:   Recent Labs  04/16/13 1606 04/17/13 0400  WBC 5.7 14.2*  HGB 8.9* 9.0*  HCT 27.3* 26.8*  PLT 297 336   BMET  Recent Labs  04/16/13 2205 04/17/13 0400  NA 141 140  K 3.9 3.9  CL 107 106  CO2 24 25  GLUCOSE 107* 121*  BUN 10 11  CREATININE 0.82 0.87  CALCIUM 9.3 9.5   PT/INR  Recent Labs  04/14/13 1453  LABPROT 14.0  INR 1.10   ABG No results found for this basename: PHART, PCO2, PO2, HCO3,  in the last 72 hours  Studies/Results: No results found.  Anti-infectives: Anti-infectives   Start     Dose/Rate Route Frequency Ordered Stop   04/16/13 2200  clindamycin (CLEOCIN) IVPB 900 mg     900 mg 100 mL/hr over 30 Minutes Intravenous 3 times per day 04/16/13 1807 04/16/13 2218   04/16/13 0922  clindamycin (CLEOCIN) 600 MG/50ML IVPB  Status:  Discontinued    Comments:  GALLMAN, KATHIE JEAN: cabinet override      04/16/13 0922 04/16/13 0925   04/16/13 0600  clindamycin (CLEOCIN) IVPB 900 mg     900 mg 100 mL/hr over 30 Minutes Intravenous On call to O.R. 04/15/13 1433 04/16/13 1227   04/16/13 0600  gentamicin (GARAMYCIN) 280.4 mg in dextrose 5 % 100 mL IVPB     5 mg/kg  56.1 kg 107 mL/hr over 60 Minutes Intravenous On call  to O.R. 04/15/13 1433 04/16/13 1221      Assessment/Plan: s/p Procedure(s): COLOSTOMY REVISION, REPAIR COLOSTOMY FISTULA (N/A) EXPLORATORY LAPAROTOMY,Partial Colectomy,Small bowel resection (N/A) APPENDECTOMY (N/A)  Continue npo, ng, pain management Repeat CBC in morning  LOS: 1 day    Vanessa [name] A 04/17/2013

## 2013-04-17 NOTE — Progress Notes (Signed)
Physical Therapy Evaluation Patient Details Name: TIFFANYANN DEROO MRN: [id number] DOB: 1976-08-28 Today's Date: 04/17/2013 Time: 4098-1191 PT Time Calculation (min): 18 min  PT Assessment / Plan / Recommendation History of Present Illness   Pt admitted with  COLOSTOMY REVISION, REPAIR COLOSTOMY FISTULA (N/A)  EXPLORATORY LAPAROTOMY,Partial Colectomy,Small bowel resection (N/A)  APPENDECTOMY.     Clinical Impression  Pt admitted with above surgeries. Pt currently with functional limitations due to the deficits listed below (see PT Problem List). Feel pt should do well and be more mobile when lines removed.  Bed level eval due to pt stated she had just gotten back in bed.  Her strength is good.  Do not anticipate much therapy for pt but will assess OOB mobility at next visit and determine any equipment or f/u at that time.   Pt will benefit from skilled PT to increase their independence and safety with mobility to allow discharge to the venue listed below.     PT Assessment  Patient needs continued PT services    Follow Up Recommendations  Home health PT;Supervision/Assistance - 24 hour         Barriers to Discharge Other (comment) (has three small children to care for)      Equipment Recommendations  Other (comment) (TBA)         Frequency Min 3X/week    Precautions / Restrictions Precautions Precautions: Fall Restrictions Weight Bearing Restrictions: No   Pertinent Vitals/Pain VSS, No pain      Mobility  Bed Mobility Bed Mobility: Rolling Right;Rolling Left Rolling Right: 6: Modified independent (Device/Increase time);With rail Rolling Left: 6: Modified independent (Device/Increase time);With rail Transfers Transfers: Not assessed Ambulation/Gait Ambulation/Gait Assistance: Not tested (comment) Stairs: No Wheelchair Mobility Wheelchair Mobility: No    Exercises General Exercises - Lower Extremity Ankle Circles/Pumps: AROM;Both;10 reps;Supine Quad Sets:  AROM;Both;10 reps;Supine Long Arc Quad: AROM;Both;10 reps;Seated Heel Slides: AROM;Both;15 reps;Supine Straight Leg Raises: AROM;Both;10 reps;Supine   PT Diagnosis: Generalized weakness  PT Problem List: Decreased activity tolerance;Decreased balance;Decreased mobility;Decreased safety awareness;Decreased knowledge of precautions PT Treatment Interventions: DME instruction;Gait training;Functional mobility training;Therapeutic activities;Therapeutic exercise;Patient/family education;Balance training     PT Goals(Current goals can be found in the care plan section) Acute Rehab PT Goals Patient Stated Goal: to go home PT Goal Formulation: With patient Time For Goal Achievement: 04/24/13 Potential to Achieve Goals: Good  Visit Information  Last PT Received On: 04/17/13 Assistance Needed: +1       Prior Functioning  Home Living Family/patient expects to be discharged to:: Private residence Living Arrangements: Children;Parent Available Help at Discharge: Family;Available 24 hours/day Type of Home: Apartment Home Access: Level entry Home Layout: Two level;Full bath on main level;Able to live on main level with bedroom/bathroom Alternate Level Stairs-Number of Steps: flight Alternate Level Stairs-Rails: Right Home Equipment: None Prior Function Level of Independence: Independent Communication Communication: No difficulties Dominant Hand: Right    Cognition  Cognition Arousal/Alertness: Awake/alert Behavior During Therapy: WFL for tasks assessed/performed Overall Cognitive Status: Within Functional Limits for tasks assessed    Extremity/Trunk Assessment Upper Extremity Assessment Upper Extremity Assessment: Defer to OT evaluation Lower Extremity Assessment Lower Extremity Assessment: Overall WFL for tasks assessed Cervical / Trunk Assessment Cervical / Trunk Assessment: Normal   Balance    End of Session PT - End of Session Equipment Utilized During Treatment:  Oxygen Activity Tolerance: Patient limited by fatigue Patient left: in bed;with call bell/phone within reach Nurse Communication: Mobility status       INGOLD,Welda Azzarello 04/17/2013, 3:13 PM  [facility] Acute Rehabilitation [phone] (pager)

## 2013-04-17 NOTE — Progress Notes (Signed)
Pt continually touches/scratches at nose/NG tube.  Pt has been frequently reminded not to touch or pull at NG tube.  Re-secured NG.  Will continue to monitor pt.

## 2013-04-18 LAB — CBC
HCT: 24.5 % — ABNORMAL LOW (ref 36.0–46.0)
Platelets: 281 10*3/uL (ref 150–400)
RDW: 17.6 % — ABNORMAL HIGH (ref 11.5–15.5)
WBC: 10.1 10*3/uL (ref 4.0–10.5)

## 2013-04-18 NOTE — Consult Note (Addendum)
WOC ostomy consult: Initial post operative contact (post complex surgical revision and repair) Stoma type/location: LLQ Colostomy  Stomal assessment/size: Pouch not removed today; scant amount of serosanguinous material in pouch and no real return of bowel sounds yet.  Stoma (visualized through pouch) is red, moist, oval, budded and elevated, but not long. Edematous. Approximate measurement is 1 and 3/4 inches long (from 12 to 6 o'clock) and 2 inches wide (from 3 to 9 o'clock) Peristomal assessment: Not seen today Treatment options for stomal/peristomal skin: Not seen today Output: Scant amount of serosanguinous material in pouch.  No output and no flatus. Ostomy pouching: 2pc., 2 and 3/4 inch pouching system used. Patient has used Psychologist, forensic in the past. Education provided: Pt. with NGT, but able to talk with ostomy nurse regarding months leading up to surgery including the instances of stomal prolapse where manual reduction was not possible and ED visits (with sugar used as an osmotic reducing agent) were required.After supportive session, patient supplied ordered to bedside for use with pouching change later in the week.  Current pouch is placed over surgical incision dressing, so it is best to leave intact until pouch changed with onset of function as the midline incision dressing will have to be removed simultaneously. WOC Nursing team will follow along with you. Thanks, Ladona Mow, MSN, RN, GNP, [place], CWON-AP [phone])

## 2013-04-18 NOTE — Progress Notes (Signed)
2 Days Post-Op  Subjective: Patient awake, alert Comfortable with Dilaudid PCA Ambulated earlier this morning No significant ostomy output  Objective: Vital signs in last 24 hours: Temp:  [97.7 F (36.5 C)-99.8 F (37.7 C)] 98.6 F (37 C) (07/27 1213) Pulse Rate:  [67-107] 87 (07/27 1200) Resp:  [13-24] 16 (07/27 1200) BP: (104-140)/(38-93) 107/87 mmHg (07/27 1200) SpO2:  [98 %-100 %] 100 % (07/27 1200) Arterial Line BP: (120-126)/(67-100) 126/100 mmHg (07/26 1500) Weight:  [132 lb 4.4 oz (60 kg)] 132 lb 4.4 oz (60 kg) (07/27 0750)    Intake/Output from previous day: 07/26 0701 - 07/27 0700 In: 3238.3 [I.V.:3023.3; NG/GT:215] Out: 1710 [Urine:835; Emesis/NG output:850; Drains:25] Intake/Output this shift: Total I/O In: 638 [I.V.:638] Out: 280 [Urine:280]  GI: mildly distended; ostomy - pink, thin drainage Honeycomb dressing with some drainage  Lab Results:   Recent Labs  04/17/13 0400 04/18/13 0359  WBC 14.2* 10.1  HGB 9.0* 8.0*  HCT 26.8* 24.5*  PLT 336 281   BMET  Recent Labs  04/16/13 2205 04/17/13 0400  NA 141 140  K 3.9 3.9  CL 107 106  CO2 24 25  GLUCOSE 107* 121*  BUN 10 11  CREATININE 0.82 0.87  CALCIUM 9.3 9.5   PT/INR No results found for this basename: LABPROT, INR,  in the last 72 hours ABG No results found for this basename: PHART, PCO2, PO2, HCO3,  in the last 72 hours  Studies/Results: No results found.  Anti-infectives: Anti-infectives   Start     Dose/Rate Route Frequency Ordered Stop   04/16/13 2200  clindamycin (CLEOCIN) IVPB 900 mg     900 mg 100 mL/hr over 30 Minutes Intravenous 3 times per day 04/16/13 1807 04/16/13 2218   04/16/13 0922  clindamycin (CLEOCIN) 600 MG/50ML IVPB  Status:  Discontinued    Comments:  GALLMAN, KATHIE JEAN: cabinet override      04/16/13 0922 04/16/13 0925   04/16/13 0600  clindamycin (CLEOCIN) IVPB 900 mg     900 mg 100 mL/hr over 30 Minutes Intravenous On call to O.R. 04/15/13 1433  04/16/13 1227   04/16/13 0600  gentamicin (GARAMYCIN) 280.4 mg in dextrose 5 % 100 mL IVPB     5 mg/kg  56.1 kg 107 mL/hr over 60 Minutes Intravenous On call to O.R. 04/15/13 1433 04/16/13 1221      Assessment/Plan: s/p Procedure(s): COLOSTOMY REVISION, REPAIR COLOSTOMY FISTULA (N/A) EXPLORATORY LAPAROTOMY,Partial Colectomy,Small bowel resection (N/A) APPENDECTOMY (N/A) Continue NG tube for now until better ostomy output Ambulate D/c Foley ICU one more day  LOS: 2 days    Vanessa [name] K. 04/18/2013

## 2013-04-19 ENCOUNTER — Telehealth: Payer: Self-pay | Admitting: General Surgery

## 2013-04-19 LAB — COMPREHENSIVE METABOLIC PANEL
ALT: 8 U/L (ref 0–35)
Alkaline Phosphatase: 88 U/L (ref 39–117)
BUN: 6 mg/dL (ref 6–23)
CO2: 26 mEq/L (ref 19–32)
Chloride: 101 mEq/L (ref 96–112)
GFR calc Af Amer: >90 mL/min (ref >90)
GFR calc non Af Amer: >90 mL/min (ref >90)
Glucose, Bld: 78 mg/dL (ref 70–99)
Potassium: 3.4 mEq/L — ABNORMAL LOW (ref 3.5–5.1)
Sodium: 136 mEq/L (ref 135–145)
Total Bilirubin: 0.6 mg/dL (ref 0.3–1.2)

## 2013-04-19 LAB — MAGNESIUM: Magnesium: 1.7 mg/dL (ref 1.5–2.5)

## 2013-04-19 LAB — BASIC METABOLIC PANEL
BUN: 5 mg/dL — ABNORMAL LOW (ref 6–23)
Chloride: 101 mEq/L (ref 96–112)
Creatinine, Ser: 0.7 mg/dL (ref 0.50–1.10)
Glucose, Bld: 97 mg/dL (ref 70–99)
Potassium: 3.8 mEq/L (ref 3.5–5.1)

## 2013-04-19 LAB — CBC WITH DIFFERENTIAL/PLATELET
Basophils Relative: 0 % (ref 0–1)
HCT: 23.9 % — ABNORMAL LOW (ref 36.0–46.0)
Hemoglobin: 7.8 g/dL — ABNORMAL LOW (ref 12.0–15.0)
Lymphs Abs: 1.2 10*3/uL (ref 0.7–4.0)
MCH: 26.4 pg (ref 26.0–34.0)
MCHC: 32.6 g/dL (ref 30.0–36.0)
Monocytes Absolute: 1.3 10*3/uL — ABNORMAL HIGH (ref 0.1–1.0)
Monocytes Relative: 11 % (ref 3–12)
Neutro Abs: 8.8 10*3/uL — ABNORMAL HIGH (ref 1.7–7.7)
Neutrophils Relative %: 76 % (ref 43–77)
RBC: 2.96 MIL/uL — ABNORMAL LOW (ref 3.87–5.11)

## 2013-04-19 MED ORDER — FAT EMULSION 20 % IV EMUL
250.0000 mL | INTRAVENOUS | Status: AC
  Administered 2013-04-19: 250 mL via INTRAVENOUS
  Filled 2013-04-19: qty 250

## 2013-04-19 MED ORDER — TRACE MINERALS CR-CU-F-FE-I-MN-MO-SE-ZN IV SOLN
INTRAVENOUS | Status: AC
  Administered 2013-04-19 – 2013-04-20 (×2): via INTRAVENOUS
  Filled 2013-04-19: qty 2000

## 2013-04-19 MED ORDER — SODIUM CHLORIDE 0.9 % IJ SOLN
10.0000 mL | Freq: Two times a day (BID) | INTRAMUSCULAR | Status: DC
  Administered 2013-04-26: 9 mL
  Administered 2013-04-27: 12 mL
  Administered 2013-04-28 – 2013-05-08 (×3): 10 mL

## 2013-04-19 MED ORDER — SODIUM CHLORIDE 0.9 % IJ SOLN
10.0000 mL | INTRAMUSCULAR | Status: DC | PRN
  Administered 2013-04-19 – 2013-05-04 (×21): 10 mL
  Administered 2013-05-05: 20 mL
  Administered 2013-05-06 – 2013-05-09 (×5): 10 mL

## 2013-04-19 MED ORDER — DEXTROSE-NACL 5-0.45 % IV SOLN
INTRAVENOUS | Status: DC
  Administered 2013-04-19: 17:00:00 via INTRAVENOUS
  Administered 2013-04-21 – 2013-04-23 (×2): 20 mL/h via INTRAVENOUS

## 2013-04-19 NOTE — Clinical Documentation Improvement (Signed)
THIS DOCUMENT IS NOT A PERMANENT PART OF THE MEDICAL RECORD  Please update your documentation with the medical record to reflect your response to this query. If you need help knowing how to do this please call [phone].  04/19/13   Dear Dr. [name] / Associates,  In a better effort to capture your patient's severity of illness, reflect appropriate length of stay and utilization of resources, a review of the patient medical record has revealed the following indicators.    Based on your clinical judgment, please clarify and document in a progress note and/or discharge summary the clinical condition associated with the following supporting information:  In responding to this query please exercise your independent judgment.  The fact that a query is asked, does not imply that any particular answer is desired or expected.    Possible Clinical Conditions?  ___X____Hypokalemia   _______Other Condition  _______Cannot Clinically Determine      Diagnostics: 7/25:  potassium: 2.6  Treatment: 7/25: kcl 10 meq IVPB 7/25: D5 1/2 NS with 20 meq kcl @ 90 ml/hr   You may use possible, probable, or suspect with inpatient documentation. possible, probable, suspected diagnoses MUST be documented at the time of discharge  Reviewed:  no additional documentation provided  Thank You,  Marciano Sequin,  Clinical Documentation Specialist: [phone] Health Information Management [place]

## 2013-04-19 NOTE — Progress Notes (Signed)
Utilization Review Completed.  

## 2013-04-19 NOTE — Progress Notes (Signed)
PARENTERAL NUTRITION CONSULT NOTE - INITIAL  Pharmacy Consult for TNA Indication: chronic malnutrition, on home TPN  Allergies  Allergen Reactions  . Penicillins Other (See Comments)    "anything w/penicillin in it makes me bleed"  . Labetalol Hcl Itching and Other (See Comments)    Bumps to bilateral arms.  . Morphine And Related Hives    Patient Measurements: Height: 5\' 4"  (162.6 cm) Weight: 136 lb 11 oz (62 kg) IBW/kg (Calculated) : 54.7   Vital Signs: Temp: 98.8 F (37.1 C) (07/28 1150) Temp src: Oral (07/28 1150) BP: 136/90 mmHg (07/28 1200) Pulse Rate: 95 (07/28 1000) Intake/Output from previous day: 07/27 0701 - 07/28 0700 In: 3079 [I.V.:3019; NG/GT:60] Out: 2320 [Urine:1490; Emesis/NG output:800; Drains:30] Intake/Output from this shift: Total I/O In: 485 [I.V.:485] Out: 900 [Urine:900]  Labs:  Recent Labs  04/17/13 0400 04/18/13 0359 04/19/13 0920  WBC 14.2* 10.1 11.6*  HGB 9.0* 8.0* 7.8*  HCT 26.8* 24.5* 23.9*  PLT 336 281 309  INR  --   --  1.23     Recent Labs  04/16/13 2205 04/17/13 0400 04/19/13 0920  NA 141 140 136  K 3.9 3.9 3.8  CL 107 106 101  CO2 24 25 26   GLUCOSE 107* 121* 97  BUN 10 11 5*  CREATININE 0.82 0.87 0.70  CALCIUM 9.3 9.5 9.8   Estimated Creatinine Clearance: 83.1 ml/min (by C-G formula based on Cr of 0.7).   No results found for this basename: GLUCAP,  in the last 72 hours  Medical History: Past Medical History  Diagnosis Date  . History of blood transfusion     "I had ~ 7 in 01/2012"  . Anemia   . Radiation Nov.18,2011-Jan.23,2012    cervix  . Hypertension     no treatment necessary for BP  since July 2013  . Non-ischemic cardiomyopathy, EF 25 % 02/14/2013  . At risk for sudden cardiac death Mar 11, 2013    hs life vest  . CHF (congestive heart failure)   . Dysrhythmia     WEARS LIFE VEST (DR. C [facility])  . Cervical cancer ~ 2011    cervical  Current Nutrition:  NPO x ice chips Assessment:  Patient known  to pharmacy from past TPN consults. She has a prolapsed ostomy which is s/p repair she is now s/p colostomy revision, repair colostomy fistula, exp lap, Partial colectomy. Small bowel resection, appendectomy. Patient has extensive GI history.  She was on TPN per [facility] at home previously, but per [facility] she has been off TPN now for about 1 month.  Nutritional Goals:  1800-1920 kCal, 80-95 grams of protein per day per RD recommendations 5/23  GI: s/p COLOSTOMY REVISION, REPAIR COLOSTOMY FISTULA (N/A)  EXPLORATORY LAPAROTOMY,Partial Colectomy,Small bowel resection (N/A)  APPENDECTOMY  Has NG and colostomy LUQ Endo: no hx, has not required insulin in her TPN in the past,  Lytes: Lytes ok. Noted patient has accumulated lytes in the past and has required lyte free formula  Renal: SCr stable, CrCl ~36mL/min;   Cards: cardiomyopathy (EF 5/10 20%, BNP 1234 12-Mar-2023) On bidil, lisinipril, spironolactone; has LifeVest;   Hepatobil: alkphos, AST, ALT all nml, albumin 3.3, TBili low at 0.2   rminate DVT found on doppler 5/22; Bridge lovenox d/c'd 5/29. Pt still having hematuria but much improved.  Best Practices: wafarin currently on hold. Dr. [name] wants to reassess abd wound for bleeding before resuming; on sq heparin for VTE px currently TPN Access: PICC  TPN day#: 0 started  04/19/13  Plan:  -  start Clinimix E 5/15 with a  14 hr cycle at 105mL/hr x1 hour, then 154mL/hr over 12 hours, then 58mL/hr x1 hour for a total of over 14 hours  - this will provide 1853 kcals and 96 gms of protein - Trace elements, MVI, and IV Lipids 58m l/hr daily  - decrease maintenance IVF to [facility]  Herby Abraham, Pharm.D. [phone] 04/19/2013 2:14 PM

## 2013-04-19 NOTE — Progress Notes (Signed)
Occupational Therapy Evaluation Patient Details Name: Vanessa [name] MRN: [id number] DOB: October 04, 1975 Today's Date: 04/19/2013 Time: 4098-1191 OT Time Calculation (min): 26 min  OT Assessment / Plan / Recommendation History of present illness Pt s/p colostomy, colectomy, appendectomy   Clinical Impression   Pt making good progress. Pt will benefit from [facility] after D/C. Discussed use of BSC for toileting and use in shower. No further OT services needed.     OT Assessment  Patient does not need any further OT services    Follow Up Recommendations  No OT follow up    Barriers to Discharge      Equipment Recommendations  3 in 1 bedside comode    Recommendations for Other Services    Frequency       Precautions / Restrictions Precautions Precautions: Fall Precaution Comments: jp drain Restrictions Weight Bearing Restrictions: No   Pertinent Vitals/Pain no apparent distress     ADL  Grooming: Modified independent Where Assessed - Grooming: Supported standing Upper Body Bathing: Set up Where Assessed - Upper Body Bathing: Unsupported sitting Lower Body Bathing: Set up Where Assessed - Lower Body Bathing: Supported sit to stand Upper Body Dressing: Set up Where Assessed - Upper Body Dressing: Unsupported sitting Lower Body Dressing: Set up Where Assessed - Lower Body Dressing: Supported sit to stand Toilet Transfer: Hydrographic surveyor Method: Sit to Barista: Materials engineer and Hygiene: Modified independent Where Assessed - Engineer, mining and Hygiene: Sit to stand from 3-in-1 or toilet Equipment Used: Gait belt Transfers/Ambulation Related to ADLs: minguard. HHA ADL Comments: Educated pt on DME nees. REc pt having reacher for home use    OT Diagnosis:    OT Problem List:   OT Treatment Interventions:     OT Goals(Current goals can be found in the care plan section) Acute Rehab OT  Goals Patient Stated Goal: to go home OT Goal Formulation:  (eval only)  Visit Information  Last OT Received On: 04/19/13 Assistance Needed: +1 History of Present Illness: Pt s/p colostomy, colectomy, appendectomy       Prior Functioning     Home Living Family/patient expects to be discharged to:: Private residence Living Arrangements: Children;Parent Available Help at Discharge: Family;Available 24 hours/day Type of Home: Apartment Home Access: Level entry Home Layout: Two level;Full bath on main level;Able to live on main level with bedroom/bathroom Alternate Level Stairs-Number of Steps: flight Alternate Level Stairs-Rails: Right Home Equipment: None Prior Function Level of Independence: Independent Communication Communication: No difficulties Dominant Hand: Right         Vision/Perception Vision - History Baseline Vision: No visual deficits   Cognition  Cognition Arousal/Alertness: Awake/alert Behavior During Therapy: WFL for tasks assessed/performed Overall Cognitive Status: Within Functional Limits for tasks assessed    Extremity/Trunk Assessment Upper Extremity Assessment Upper Extremity Assessment: Overall WFL for tasks assessed Lower Extremity Assessment Lower Extremity Assessment: Overall WFL for tasks assessed Cervical / Trunk Assessment Cervical / Trunk Assessment: Normal     Mobility Bed Mobility Rolling Right: 6: Modified independent (Device/Increase time);With rail Rolling Left: 6: Modified independent (Device/Increase time);With rail Transfers Transfers: Sit to Stand;Stand to Sit Sit to Stand: 5: Supervision;From chair/3-in-1 Stand to Sit: 5: Supervision;To chair/3-in-1 Details for Transfer Assistance: used HHA. would benefit from RW     Exercise     Balance Balance Balance Assessed: Yes (independent)   End of Session OT - End of Session Equipment Utilized During Treatment: Gait belt Activity Tolerance:  Patient tolerated treatment  well Patient left: in chair;with call bell/phone within reach Nurse Communication: Mobility status  GO     Mikka Kissner,HILLARY 04/19/2013, 2:56 PM [facility], OTR/L  [phone] 04/19/2013

## 2013-04-19 NOTE — Progress Notes (Signed)
Pt safely transferred to 6N Rm 12 to [place], Charity fundraiser.

## 2013-04-19 NOTE — Progress Notes (Signed)
INITIAL NUTRITION ASSESSMENT  DOCUMENTATION CODES Per approved criteria  -Not Applicable   INTERVENTION: TPN per team. RD to continue to follow nutrition care plan.  NUTRITION DIAGNOSIS: Altered GI function related to fistula as evidenced by need for TPN.   Goal: Intake to meet >90% of estimated nutrition needs.  Monitor:  weight trends, lab trends, I/O's, TPN adequacy, ability to transition to EN vs PO's  Reason for Assessment: Consult for TPN  37 y.o. female  Admitting Dx: fistula correction and colostomy revision  ASSESSMENT: Pt with radiation colitis x 1 year. 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  Underwent the following procedures on 7/25: COLOSTOMY REVISION, REPAIR COLOSTOMY FISTULA  EXPLORATORY LAPAROTOMY, Partial Colectomy, Small bowel resection APPENDECTOMY  Pt was receiving TPN PTA up until 1 month ago. She states that she was switched to oral nutrition and did very well with eating and drinking. Stable weight. TPN ordered to resume today and pt is frustrated because TPN is "a hassle." RD consulted by TPN PharmD for new TPN.  NGT discontinued today; output yesterday was 800 ml.  Per PharmD, plan to start Clinimix E 5/15 with a 14 hr cycle at 37mL/hr x1 hour, then 132mL/hr over 12 hours, then 40mL/hr x1 hour for a total of over 14 hours. Trace elements, MVI, and IV Lipids 19ml/hr daily, this will provide 1853 kcals and 96 gms of protein and meet 100% of estimated kcal and protein needs.   Height: Ht Readings from Last 1 Encounters:  04/18/13 5\' 4"  (1.626 m)    Weight: Wt Readings from Last 1 Encounters:  04/19/13 136 lb 11 oz (62 kg)    Ideal Body Weight: 120 lb  % Ideal Body Weight: 113%  Wt Readings from Last 20 Encounters:  04/19/13 136 lb 11 oz (62 kg)  04/19/13 136 lb 11 oz (62 kg)  04/14/13 131 lb 9.6 oz (59.693 kg)  04/06/13 129 lb 1.6 oz (58.559 kg)  03/30/13  123 lb 9.6 oz (56.065 kg)  03/28/13 129 lb (58.514 kg)  03/17/13 129 lb (58.514 kg)  02/23/13 128 lb 8 oz (58.287 kg)  02/23/13 129 lb (58.514 kg)  02/19/13 128 lb 15.5 oz (58.5 kg)  02/19/13 128 lb 15.5 oz (58.5 kg)  01/28/13 148 lb (67.132 kg)  01/04/13 131 lb (59.421 kg)  12/21/12 128 lb 3.2 oz (58.151 kg)  10/27/12 113 lb (51.256 kg)  09/22/12 110 lb 9.6 oz (50.168 kg)  08/11/12 143 lb 11.8 oz (65.2 kg)  08/11/12 143 lb 11.8 oz (65.2 kg)  08/07/12 139 lb 4.8 oz (63.186 kg)  07/21/12 136 lb 6.4 oz (61.871 kg)   Usual Body Weight: 130-140 lb  % Usual Body Weight: 100%  BMI:  Body mass index is 23.45 kg/(m^2). WNL  Estimated Nutritional Needs: Kcal: 1700 - 2000 Protein: 80 - 95 g Fluid: maintain positive balance with ostomy output  Skin:  LLQ colostomy  Diet Order: NPO  EDUCATION NEEDS: -No education needs identified at this time   Intake/Output Summary (Last 24 hours) at 04/19/13 1406 Last data filed at 04/19/13 1200  Gross per 24 hour  Intake   2646 ml  Output   2370 ml  Net    276 ml    Last BM: minimal since admit  Labs:   Recent Labs Lab 04/16/13 2205 04/17/13 0400 04/19/13 0920  NA 141 140 136  K 3.9 3.9 3.8  CL 107 106 101  CO2 24  25 26  BUN 10 11 5*  CREATININE 0.82 0.87 0.70  CALCIUM 9.3 9.5 9.8  GLUCOSE 107* 121* 97    CBG (last 3)  No results found for this basename: GLUCAP,  in the last 72 hours  Scheduled Meds: . heparin subcutaneous  5,000 Units Subcutaneous Q8H  . HYDROmorphone PCA 0.3 mg/mL   Intravenous Q4H  . isosorbide-hydrALAZINE  1 tablet Oral BID  . lisinopril  40 mg Oral Daily  . spironolactone  12.5 mg Oral Daily    Continuous Infusions: . dextrose 5 % and 0.45 % NaCl with KCl 20 mEq/L 90 mL/hr at 04/19/13 0800    Past Medical History  Diagnosis Date  . History of blood transfusion     "I had ~ 7 in 01/2012"  . Anemia   . Radiation Nov.18,2011-Jan.23,2012    cervix  . Hypertension     no treatment  necessary for BP  since July 2013  . Non-ischemic cardiomyopathy, EF 25 % 02/14/2013  . At risk for sudden cardiac death 2013/03/01    hs life vest  . CHF (congestive heart failure)   . Dysrhythmia     WEARS LIFE VEST (DR. C [facility])  . Cervical cancer ~ 2011    cervical    Past Surgical History  Procedure Laterality Date  . Radiation implants  ~ 2011    "for cervical cancer"  . Colonoscopy  01/30/2012    Procedure: COLONOSCOPY;  Surgeon: Beverley Fiedler, MD;  Location[facility] ENDOSCOPY;  Service: Gastroenterology;  Laterality: N/A;  . Colonoscopy  01/30/2012    Procedure: COLONOSCOPY;  Surgeon: Beverley Fiedler, MD;  Location: [facility] OR;  Service: Gastroenterology;  Laterality: N/A;  . Colostomy revision  02/04/2012    Procedure: COLON RESECTION SIGMOID;  Surgeon: Cherylynn Ridges, MD;  Location: [facility] OR;  Service: General;  Laterality: N/A;  . Cholecystectomy  2006  . Laparotomy  08/11/2012    Procedure: EXPLORATORY LAPAROTOMY;  Surgeon: Cherylynn Ridges, MD;  Location: [facility] OR;  Service: General;  Laterality: N/A;  . Laparoscopic lysis of adhesions  08/11/2012    Procedure: LAPAROSCOPIC LYSIS OF ADHESIONS;  Surgeon: Cherylynn Ridges, MD;  Location: [facility] OR;  Service: General;  Laterality: N/A;  . Bowel resection  08/11/2012    Procedure: SMALL BOWEL RESECTION;  Surgeon: Cherylynn Ridges, MD;  Location: [facility] OR;  Service: General;  Laterality: N/A;  . Colostomy  08/11/2012    Procedure: COLOSTOMY;  Surgeon: Cherylynn Ridges, MD;  Location: [facility] OR;  Service: General;  Laterality: N/A;  . Colostomy revision N/A 02/03/2013    Procedure: COLOSTOMY REVISION;  Surgeon: Cherylynn Ridges, MD;  Location: [facility] OR;  Service: General;  Laterality: N/A;    Jarold Motto MS, RD, LDN Pager: [phone] After-hours pager: [phone]

## 2013-04-19 NOTE — Clinical Documentation Improvement (Signed)
THIS DOCUMENT IS NOT A PERMANENT PART OF THE MEDICAL RECORD  Please update your documentation with the medical record to reflect your response to this query. If you need help knowing how to do this please call [phone].  04/19/13  Dear Dr. [name] Marton Redwood  In an effort to better capture your patient's severity of illness, reflect appropriate length of stay and utilization of resources, a review of the patient medical record has revealed the following indicators.    Based on your clinical judgment, please clarify and document in a progress note and/or discharge summary the clinical condition associated with the following supporting information:  In responding to this query please exercise your independent judgment.  The fact that a query is asked, does not imply that any particular answer is desired or expected.   Possible Clinical Conditions?   " Expected Acute Blood Loss Anemia/JW  " Acute Blood Loss Anemia  " Acute on chronic blood loss anemia  " Precipitous drop in Hematocrit  " Other Condition  " Cannot Clinically Determine     Risk Factors:  EBL: 300 ml per 7/25 Anesthesia record.   Diagnostics: H&H on 7/27: 8.0/24.5  H&H on 7/25:   11.6/34.0  Transfusion: 7/25: order for transfusion of PRBC  IV fluids / plasma expanders: Albumin 5%: 250 ml per 7/25 Anesthesia record. LR: 2400 ml per 7/25 Anesthesia record.  Reviewed:  no additional documentation provided  Thank You,  Marciano Sequin,  Clinical Documentation Specialist: [phone] Health Information Management [place]

## 2013-04-19 NOTE — Progress Notes (Signed)
Physical Therapy Treatment Patient Details Name: Vanessa [name] MRN: [id number] DOB: [birth year] Today's Date: 04/19/2013 Time: 0726-0759 PT Time Calculation (min): 33 min  PT Assessment / Plan / Recommendation  History of Present Illness Pt s/p colostomy, colectomy, appendectomy   Clinical Impression Pt able to ambulate today but requires cueing to correct posture and RW use and not ready to attempt stairs yet. Pt very pleasant and eager to reduce lines and return home to 3 kids (8,6,4) she has a total of 8 children. Will continue to follow to maximize independence and encouraged mobility with nursing.   PT Comments     Follow Up Recommendations        Does the patient have the potential to tolerate intense rehabilitation     Barriers to Discharge        Equipment Recommendations  Rolling walker with 5" wheels    Recommendations for Other Services    Frequency     Progress towards PT Goals Progress towards PT goals: Progressing toward goals  Plan Current plan remains appropriate    Precautions / Restrictions Precautions Precautions: Fall Precaution Comments: jp drain   Pertinent Vitals/Pain 7/10 abdominal pain, HR 112    Mobility  Bed Mobility Bed Mobility: Not assessed Transfers Transfers: Sit to Stand;Stand to Sit Sit to Stand: 5: Supervision;From chair/3-in-1 Stand to Sit: 5: Supervision;To chair/3-in-1 Details for Transfer Assistance: x 2 cueing for hand placement and safety Ambulation/Gait Ambulation/Gait Assistance: 5: Supervision Ambulation Distance (Feet): 300 Feet Assistive device: Rolling walker Ambulation/Gait Assistance Details: cueing for posture and position in RW Gait Pattern: Step-through pattern;Decreased stride length;Trunk flexed Gait velocity: 20'/26sec= .77 ft/sec placing pt at increased fall risk Stairs: No    Exercises General Exercises - Lower Extremity Long Arc Quad: AROM;Both;10 reps;Seated Hip Flexion/Marching: AROM;Both;20  reps;Seated   PT Diagnosis:    PT Problem List:   PT Treatment Interventions:     PT Goals (current goals can now be found in the care plan section)    Visit Information  Last PT Received On: 04/19/13 Assistance Needed: +1 History of Present Illness: Pt s/p colostomy, colectomy, appendectomy    Subjective Data      Cognition  Cognition Arousal/Alertness: Awake/alert Behavior During Therapy: WFL for tasks assessed/performed Overall Cognitive Status: Within Functional Limits for tasks assessed    Balance     End of Session PT - End of Session Activity Tolerance: Patient tolerated treatment well Patient left: in chair;with call bell/phone within reach Nurse Communication: Mobility status   GP     Delorse Lek 04/19/2013, 8:41 AM Delaney Meigs, PT [phone]

## 2013-04-19 NOTE — Progress Notes (Signed)
GS Progress Note Subjective: Patient is up and about, walking around the ICU.  Doing fairly well.  Objective: Vital signs in last 24 hours: Temp:  [97.9 F (36.6 C)-99.1 F (37.3 C)] 99.1 F (37.3 C) (07/28 0400) Pulse Rate:  [76-160] 99 (07/28 0700) Resp:  [12-28] 12 (07/28 0700) BP: (95-133)/(59-101) 123/92 mmHg (07/28 0700) SpO2:  [91 %-100 %] 96 % (07/28 0700) Weight:  [60 kg (132 lb 4.4 oz)-62 kg (136 lb 11 oz)] 62 kg (136 lb 11 oz) (07/28 0600)    Intake/Output from previous day: 07/27 0701 - 07/28 0700 In: 3079 [I.V.:3019; NG/GT:60] Out: 2320 [Urine:1490; Emesis/NG output:800; Drains:30] Intake/Output this shift:    General:  No acute distress.  Itching from her narcotics  Lungs: Clear to auscultation  Abd: Soft, some bowel sounds.  NGT output not very high.  Ostomy without gas or fluid or stool output.  Serosanguinous drainage on the lower part of her wound.  Extremities: No DVT signs or symptoms.  Neuro: Intact  Lab Results: CBC   Recent Labs  04/17/13 0400 04/18/13 0359  WBC 14.2* 10.1  HGB 9.0* 8.0*  HCT 26.8* 24.5*  PLT 336 281   BMET  Recent Labs  04/16/13 2205 04/17/13 0400  NA 141 140  K 3.9 3.9  CL 107 106  CO2 24 25  GLUCOSE 107* 121*  BUN 10 11  CREATININE 0.82 0.87  CALCIUM 9.3 9.5   PT/INR No results found for this basename: LABPROT, INR,  in the last 72 hours ABG No results found for this basename: PHART, PCO2, PO2, HCO3,  in the last 72 hours  Studies/Results: No results found.  Anti-infectives: Anti-infectives   Start     Dose/Rate Route Frequency Ordered Stop   04/16/13 2200  clindamycin (CLEOCIN) IVPB 900 mg     900 mg 100 mL/hr over 30 Minutes Intravenous 3 times per day 04/16/13 1807 04/16/13 2218   04/16/13 0922  clindamycin (CLEOCIN) 600 MG/50ML IVPB  Status:  Discontinued    Comments:  GALLMAN, KATHIE JEAN: cabinet override      04/16/13 0922 04/16/13 0925   04/16/13 0600  clindamycin (CLEOCIN) IVPB 900 mg      900 mg 100 mL/hr over 30 Minutes Intravenous On call to O.R. 04/15/13 1433 04/16/13 1227   04/16/13 0600  gentamicin (GARAMYCIN) 280.4 mg in dextrose 5 % 100 mL IVPB     5 mg/kg  56.1 kg 107 mL/hr over 60 Minutes Intravenous On call to O.R. 04/15/13 1433 04/16/13 1221      Assessment/Plan: s/p Procedure(s): COLOSTOMY REVISION, REPAIR COLOSTOMY FISTULA EXPLORATORY LAPAROTOMY,Partial Colectomy,Small bowel resection APPENDECTOMY DC NGT. Transfer to 6N Continue PCA. Change colostomy appliance and wound dressing. Continue Ice chips. Restart TPN  LOS: 3 days    Marta Lamas. [name], MD, FACS [phone] [facility] 04/19/2013

## 2013-04-19 NOTE — Progress Notes (Signed)
[facility]  Patient Status: Pt previously on service with [facility] just prior to this readmission.  [facility] is providing the following services:   Pt was receiving RN and home infusion pharmacy services for TPN at home.  [facility] RN Coordinators will follow during this readmission and be available to support the transition home if/when needed.  If patient discharges after hours, please call [phone].   Sedalia Muta 04/19/2013, 9:51 AM

## 2013-04-20 ENCOUNTER — Encounter (HOSPITAL_COMMUNITY): Payer: Self-pay | Admitting: General Surgery

## 2013-04-20 DIAGNOSIS — R609 Edema, unspecified: Secondary | ICD-10-CM

## 2013-04-20 LAB — COMPREHENSIVE METABOLIC PANEL
AST: 14 U/L (ref 0–37)
BUN: 12 mg/dL (ref 6–23)
CO2: 27 mEq/L (ref 19–32)
Chloride: 105 mEq/L (ref 96–112)
Creatinine, Ser: 0.74 mg/dL (ref 0.50–1.10)
GFR calc Af Amer: >90 mL/min (ref >90)
GFR calc non Af Amer: >90 mL/min (ref >90)
Glucose, Bld: 130 mg/dL — ABNORMAL HIGH (ref 70–99)
Total Bilirubin: 0.4 mg/dL (ref 0.3–1.2)

## 2013-04-20 LAB — PREALBUMIN: Prealbumin: 11.2 mg/dL — ABNORMAL LOW (ref 17.0–34.0)

## 2013-04-20 LAB — TRIGLYCERIDES: Triglycerides: 96 mg/dL (ref <150)

## 2013-04-20 LAB — PHOSPHORUS: Phosphorus: 3.7 mg/dL (ref 2.3–4.6)

## 2013-04-20 MED ORDER — FAT EMULSION 20 % IV EMUL
250.0000 mL | INTRAVENOUS | Status: AC
  Administered 2013-04-20: 250 mL via INTRAVENOUS
  Filled 2013-04-20 (×2): qty 250

## 2013-04-20 MED ORDER — FAT EMULSION 20 % IV EMUL
250.0000 mL | INTRAVENOUS | Status: DC
  Filled 2013-04-20: qty 250

## 2013-04-20 MED ORDER — TRACE MINERALS CR-CU-F-FE-I-MN-MO-SE-ZN IV SOLN
INTRAVENOUS | Status: DC
  Filled 2013-04-20: qty 2000

## 2013-04-20 MED ORDER — TRACE MINERALS CR-CU-F-FE-I-MN-MO-SE-ZN IV SOLN
INTRAVENOUS | Status: AC
  Filled 2013-04-20: qty 2000

## 2013-04-20 NOTE — Plan of Care (Signed)
Patient continues to cough up thick, white sputum. Using Yanker at bedside for mouth care. NG to intermittent low wall suction with 300 cc of green/yellow fluid. Patient complained of itching. Benadryl given. Using Dilaudid PCA for pain relief. Sherlyn Lees, RN

## 2013-04-20 NOTE — Progress Notes (Signed)
GS Progress Note Subjective: Patient sitting up in bed with emesis basin at side.  No vomiting, but hiccupping and spitting out phlegm.  Nauseated also.  Abdomen is distended.  Objective: Vital signs in last 24 hours: Temp:  [98.2 F (36.8 C)-99.7 F (37.6 C)] 98.4 F (36.9 C) (07/29 0513) Pulse Rate:  [93-97] 97 (07/29 0513) Resp:  [12-22] 19 (07/29 0818) BP: (117-150)/(84-103) 150/103 mmHg (07/29 0513) SpO2:  [96 %-100 %] 100 % (07/29 0818) Weight:  [60.646 kg (133 lb 11.2 oz)] 60.646 kg (133 lb 11.2 oz) (07/29 0539)    Intake/Output from previous day: 07/28 0701 - 07/29 0700 In: 2275 [P.O.:120; I.V.:485; TPN:1670] Out: 3295 [Urine:3250; Drains:15; Stool:30] Intake/Output this shift:    Lungs: Clear to auscultation  Abd: Distended, not tense, no peritonitis.  Blake drain was not charged.  Minimal output.  Wound is okay.  Stoma is pink and viable, but no output.  Extremities: No DVT signs or symptoms.  Duplex study should be done today.  Neuro: Intact  Lab Results: CBC   Recent Labs  04/18/13 0359 04/19/13 0920  WBC 10.1 11.6*  HGB 8.0* 7.8*  HCT 24.5* 23.9*  PLT 281 309   BMET  Recent Labs  04/19/13 1157 04/20/13 0500  NA 136 139  K 3.4* 3.8  CL 101 105  CO2 26 27  GLUCOSE 78 130*  BUN 6 12  CREATININE 0.69 0.74  CALCIUM 9.8 10.1   PT/INR  Recent Labs  04/19/13 0920  LABPROT 15.2  INR 1.23   ABG No results found for this basename: PHART, PCO2, PO2, HCO3,  in the last 72 hours  Studies/Results: No results found.  Anti-infectives: Anti-infectives   Start     Dose/Rate Route Frequency Ordered Stop   04/16/13 2200  clindamycin (CLEOCIN) IVPB 900 mg     900 mg 100 mL/hr over 30 Minutes Intravenous 3 times per day 04/16/13 1807 04/16/13 2218   04/16/13 0922  clindamycin (CLEOCIN) 600 MG/50ML IVPB  Status:  Discontinued    Comments:  GALLMAN, KATHIE JEAN: cabinet override      04/16/13 0922 04/16/13 0925   04/16/13 0600  clindamycin  (CLEOCIN) IVPB 900 mg     900 mg 100 mL/hr over 30 Minutes Intravenous On call to O.R. 04/15/13 1433 04/16/13 1227   04/16/13 0600  gentamicin (GARAMYCIN) 280.4 mg in dextrose 5 % 100 mL IVPB     5 mg/kg  56.1 kg 107 mL/hr over 60 Minutes Intravenous On call to O.R. 04/15/13 1433 04/16/13 1221      Assessment/Plan: s/p Procedure(s): COLOSTOMY REVISION, REPAIR COLOSTOMY FISTULA EXPLORATORY LAPAROTOMY,Partial Colectomy,Small bowel resection APPENDECTOMY Back off on diet.  Place NGT if patient vomits. Check labs in the AM. Postoperative ileus.  LOS: 4 days    Vanessa [name]. [name], MD, FACS [phone] [facility] 04/20/2013

## 2013-04-20 NOTE — Progress Notes (Signed)
PARENTERAL NUTRITION CONSULT NOTE - INITIAL  Pharmacy Consult for TNA Indication: chronic malnutrition, on home TPN  Allergies  Allergen Reactions  . Penicillins Other (See Comments)    "anything w/penicillin in it makes me bleed"  . Labetalol Hcl Itching and Other (See Comments)    Bumps to bilateral arms.  . Morphine And Related Hives    Patient Measurements: Height: 5\' 4"  (162.6 cm) Weight: 133 lb 11.2 oz (60.646 kg) IBW/kg (Calculated) : 54.7   Vital Signs: Temp: 98.4 F (36.9 C) (07/29 0513) Temp src: Oral (07/29 0513) BP: 150/103 mmHg (07/29 0513) Pulse Rate: 97 (07/29 0513) Intake/Output from previous day: 07/28 0701 - 07/29 0700 In: 2275 [P.O.:120; I.V.:485; TPN:1670] Out: 3295 [Urine:3250; Drains:15; Stool:30] Intake/Output from this shift:    Labs:  Recent Labs  04/18/13 0359 04/19/13 0920  WBC 10.1 11.6*  HGB 8.0* 7.8*  HCT 24.5* 23.9*  PLT 281 309  INR  --  1.23     Recent Labs  04/19/13 0920 04/19/13 1157 04/20/13 0500  NA 136 136 139  K 3.8 3.4* 3.8  CL 101 101 105  CO2 26 26 27   GLUCOSE 97 78 130*  BUN 5* 6 12  CREATININE 0.70 0.69 0.74  CALCIUM 9.8 9.8 10.1  MG  --  1.7 2.0  PHOS  --  2.8 3.7  PROT  --  6.1 6.1  ALBUMIN  --  2.7* 2.6*  AST  --  12 14  ALT  --  8 8  ALKPHOS  --  88 92  BILITOT  --  0.6 0.4  PREALBUMIN  --  11.2*  --   TRIG  --  124 96   Estimated Creatinine Clearance: 83.1 ml/min (by C-G formula based on Cr of 0.74).   No results found for this basename: GLUCAP,  in the last 72 hours  Medical History: Past Medical History  Diagnosis Date  . History of blood transfusion     "I had ~ 7 in 01/2012"  . Anemia   . Radiation Nov.18,2011-Jan.23,2012    cervix  . Hypertension     no treatment necessary for BP  since July 2013  . Non-ischemic cardiomyopathy, EF 25 % 02/14/2013  . At risk for sudden cardiac death 03-06-2013    hs life vest  . CHF (congestive heart failure)   . Dysrhythmia     WEARS LIFE VEST  (DR. C [facility])  . Cervical cancer ~ 2011    cervical  Current Nutrition:  Clear liquid diet + cyclic TNA 1920 mls/14 hours Assessment:  Patient known to pharmacy from past TPN consults. She has a prolapsed ostomy which is s/p repair she is now s/p colostomy revision, repair colostomy fistula, exp lap, Partial colectomy. Small bowel resection, appendectomy. Patient has extensive GI history.  She was on TPN per [facility] at home previously, but per [facility] she has been off TPN now for about 1 month. TPN resumed 7/28 PM and tolerated well. She is having nausea with clear liquid diet and abdomen is distended.  Stoma with no output.  Plan is to back off on diet and place NGT if vomits.   Nutritional Goals:  Baseline prealbumin = 11.2 on 04/19/13 - could reflect post-op status. 1800-1920 kCal, 80-95 grams of protein per day per RD recommendations 5/23  GI: s/p COLOSTOMY REVISION, REPAIR COLOSTOMY FISTULA (N/A)  EXPLORATORY LAPAROTOMY,Partial Colectomy,Small bowel resection (N/A)  APPENDECTOMY  has colostomy LUQ.  Has post-op ileus.  Endo: no hx, has not  required insulin in her TPN in the past, serum glucose acceptable at 130 Lytes: Lytes ok. Calcium corrects to 11.2. (Caxphos = 41).    Noted patient has accumulated lytes in the past and has required lyte free formula  Renal: SCr stable, CrCl ~87mL/min;   Cards: cardiomyopathy (EF 5/10 20%, BNP 1234 5/28) On bidil, lisinipril, spironolactone; has LifeVest;   Hepatobil: alkphos, AST, ALT all nml, admission albumin 3.3, TBili low at 0.2    DVT found on doppler 5/22;  To get doppler today to r/o DVT.  Best Practices: wafarin currently on hold. Dr. [name] wants to reassess abd wound for bleeding before resuming; on sq heparin for VTE px currently TPN Access: PICC  TPN day#:1 started 04/19/13  Plan:  -  continue Clinimix E 5/15 with a  14 hr cycle at 81mL/hr x1 hour, then 138mL/hr over 12 hours, then 26mL/hr x1 hour for a total of over 14 hours  - this  will provide 1853 kcals and 96 gms of protein =  100% support - Trace elements, MVI, and IV Lipids 1ml/hr daily   Herby Abraham, Pharm.D. [phone] 04/20/2013 10:46 AM

## 2013-04-20 NOTE — Progress Notes (Signed)
Right lower extremity venous duplex completed.  Right:  No evidence of DVT, superficial thrombosis, or Baker's cyst.  Left:  Negative for DVT in the common femoral vein.  

## 2013-04-21 LAB — BASIC METABOLIC PANEL
Chloride: 100 mEq/L (ref 96–112)
GFR calc Af Amer: >90 mL/min (ref >90)
GFR calc non Af Amer: >90 mL/min (ref >90)
Glucose, Bld: 121 mg/dL — ABNORMAL HIGH (ref 70–99)
Potassium: 3 mEq/L — ABNORMAL LOW (ref 3.5–5.1)
Sodium: 138 mEq/L (ref 135–145)

## 2013-04-21 LAB — CBC WITH DIFFERENTIAL/PLATELET
Basophils Relative: 0 % (ref 0–1)
Eosinophils Absolute: 0.3 10*3/uL (ref 0.0–0.7)
Lymphs Abs: 1.2 10*3/uL (ref 0.7–4.0)
MCH: 26.1 pg (ref 26.0–34.0)
Neutro Abs: 5 10*3/uL (ref 1.7–7.7)
Neutrophils Relative %: 65 % (ref 43–77)
Platelets: 308 10*3/uL (ref 150–400)
RBC: 2.64 MIL/uL — ABNORMAL LOW (ref 3.87–5.11)
WBC: 7.8 10*3/uL (ref 4.0–10.5)

## 2013-04-21 MED ORDER — FAT EMULSION 20 % IV EMUL
250.0000 mL | INTRAVENOUS | Status: AC
  Administered 2013-04-21: 250 mL via INTRAVENOUS
  Filled 2013-04-21: qty 250

## 2013-04-21 MED ORDER — POTASSIUM CHLORIDE 10 MEQ/100ML IV SOLN
10.0000 meq | INTRAVENOUS | Status: AC
  Administered 2013-04-21 (×4): 10 meq via INTRAVENOUS
  Filled 2013-04-21: qty 200
  Filled 2013-04-21 (×3): qty 100

## 2013-04-21 MED ORDER — TRACE MINERALS CR-CU-F-FE-I-MN-MO-SE-ZN IV SOLN
INTRAVENOUS | Status: AC
  Administered 2013-04-21: 18:00:00 via INTRAVENOUS
  Filled 2013-04-21: qty 2000

## 2013-04-21 MED ORDER — POTASSIUM CHLORIDE 10 MEQ/100ML IV SOLN
10.0000 meq | Freq: Once | INTRAVENOUS | Status: AC
  Administered 2013-04-21: 10 meq via INTRAVENOUS

## 2013-04-21 NOTE — Progress Notes (Addendum)
PARENTERAL NUTRITION CONSULT NOTE - Follow-up  Pharmacy Consult for TPN Indication: chronic malnutrition, on home TPN  Allergies  Allergen Reactions  . Penicillins Other (See Comments)    "anything w/penicillin in it makes me bleed"  . Labetalol Hcl Itching and Other (See Comments)    Bumps to bilateral arms.  . Morphine And Related Hives    Patient Measurements: Height: 5\' 4"  (162.6 cm) Weight: 133 lb 11.2 oz (60.646 kg) IBW/kg (Calculated) : 54.7   Vital Signs: Temp: 97.7 F (36.5 C) (07/30 0504) Temp src: Oral (07/30 0504) BP: 137/88 mmHg (07/30 0504) Pulse Rate: 83 (07/30 0504) Intake/Output from previous day: 07/29 0701 - 07/30 0700 In: 0  Out: 1760 [Urine:1250; Drains:10; Stool:500] Intake/Output from this shift:    Labs:  Recent Labs  04/19/13 0920  WBC 11.6*  HGB 7.8*  HCT 23.9*  PLT 309  INR 1.23     Recent Labs  04/19/13 0920 04/19/13 1157 04/20/13 0500  NA 136 136 139  K 3.8 3.4* 3.8  CL 101 101 105  CO2 26 26 27   GLUCOSE 97 78 130*  BUN 5* 6 12  CREATININE 0.70 0.69 0.74  CALCIUM 9.8 9.8 10.1  MG  --  1.7 2.0  PHOS  --  2.8 3.7  PROT  --  6.1 6.1  ALBUMIN  --  2.7* 2.6*  AST  --  12 14  ALT  --  8 8  ALKPHOS  --  88 92  BILITOT  --  0.6 0.4  PREALBUMIN  --  11.2* 9.8*  TRIG  --  124 96   Estimated Creatinine Clearance: 83.1 ml/min (by C-G formula based on Cr of 0.74).   No results found for this basename: GLUCAP,  in the last 72 hours  Current Nutrition:  Cyclic TPN 1920 mls over 14 hours + lipids 17.8 ml/hr over 14 hours provides 1862 kcals and 96 gms of protein (meets 100% estimated needs)  Nutritional Goals:  1700-2000 kCal, 80-95 grams of protein per day per RD recommendations 5/23   Assessment:  Patient known to pharmacy from past TPN consults. She has a prolapsed ostomy which is s/p repair she is now s/p colostomy revision, repair colostomy fistula, exp lap, Partial colectomy. Small bowel resection, appendectomy. Patient  has extensive GI history.  She was on TPN per [facility] at home previously, but per [facility] she has been off TPN now for about 1 month. TPN resumed 7/28 PM for ileus.  GI: s/p COLOSTOMY REVISION, REPAIR COLOSTOMY FISTULA (N/A)  EXPLORATORY LAPAROTOMY,Partial Colectomy,Small bowel resection (N/A)  APPENDECTOMY. has colostomy LUQ.  Has post-op ileus. NPO. Pt NGT out last evening and pt does not want restarted. No more vomiting last night, abd distended. Ostomy o/p 500 cc/24h. Baseline prealbumin 11.2 on 04/19/13 - could reflect post-op status  Endo: no hx, has not required insulin in her TPN in the past, serum glucose acceptable at 130  Lytes: Lytes ok. Calcium corrects to 11.2. (Ca x phos < 55).  Noted patient has accumulated lytes in the past and has required lyte free formula   Renal: SCr stable, CrCl ~52mL/min  Cards: cardiomyopathy (EF 5/10 20%, BNP 1234 5/28). On bidil, lisinipril, spironolactone; has LifeVest.   Hepatobil: LFTs wnl, admission albumin 3.3. Doppler 7/29 negative for DVT.  Best Practices: warfarin (DVT 5/22) currently on hold. Dr. [name] wants to reassess abd wound for bleeding before resuming; on sq heparin for VTE px  TPN Access: PICC   TPN day#: 2 (  started 04/19/13)   Plan:  -  continue Clinimix E 5/15 with a 14 hr cycle at 44mL/hr x1 hour, then 136mL/hr over 12 hours, then 57mL/hr x1 hour for a total of over 14 hours  - Continue cyclic lipids at 17.8 ml/hr over 14 hours - Will continue to follow labs, po intake, and ability to advance diet  Christoper Fabian, PharmD, BCPS Clinical pharmacist, pager [phone] 04/21/2013 7:01 AM

## 2013-04-21 NOTE — Progress Notes (Signed)
CRITICAL VALUE ALERT  Critical value received:  hgb 6.9  Date of notification:  04/21/13  Time of notification: 0730  Critical value read back:yes  Nurse who received alert: Z Sheelah Ritacco  MD notified (1st page):Wyatt  Time of first page: 0805  MD notified (2nd page):  Time of second page:  Responding MD:  [name]  Time MD responded:  [phone]

## 2013-04-21 NOTE — Progress Notes (Signed)
PT Cancellation Note  Patient Details Name: Vanessa [name] MRN: [id number] DOB: 1976-05-16   Cancelled Treatment:    Reason Eval/Treat Not Completed: Patient declined, no reason specified.  Pt adamantly refused PT at this time.   [name] 04/21/2013, 3:02 PM Jake Shark, PT DPT [phone]

## 2013-04-21 NOTE — Progress Notes (Signed)
Pt's NG tube out prior to 7pm.  Pt does not want NG restarted.  Pt has not vomited during night.  Zofran given twice in 12 hours. Abd distended.  Ostomy has put out 500cc green liquid during night.

## 2013-04-21 NOTE — Progress Notes (Signed)
Ms. Thorman looks good today.  NGT accidentally pulled out.  Has colostomy output that is okay.  Stoma is viable.  Marta Lamas. [name], MD, FACS [phone] [facility]

## 2013-04-21 NOTE — Progress Notes (Signed)
Patient ID: Vanessa [name], female   DOB: 10-Aug-1976, 37 y.o.   MRN: [id number]   LOS: 5 days   Subjective: C/o nausea but says the Zofran is helping. Admits to some dizziness and feeling lightheaded.   Objective: Vital signs in last 24 hours: Temp:  [97.7 F (36.5 C)-99.1 F (37.3 C)] 97.7 F (36.5 C) (07/30 0504) Pulse Rate:  [83-93] 83 (07/30 0504) Resp:  [11-18] 17 (07/30 0801) BP: (137-183)/(88-104) 137/88 mmHg (07/30 0504) SpO2:  [99 %-100 %] 100 % (07/30 0801) FiO2 (%):  [99 %] 99 % (07/29 1638)    Laboratory  CBC  Recent Labs  04/19/13 0920 04/21/13 0635  WBC 11.6* 7.8  HGB 7.8* 6.9*  HCT 23.9* 21.2*  PLT 309 308   BMET  Recent Labs  04/20/13 0500 04/21/13 0635  NA 139 138  K 3.8 3.0*  CL 105 100  CO2 27 28  GLUCOSE 130* 121*  BUN 12 16  CREATININE 0.74 0.64  CALCIUM 10.1 9.7    Physical Exam General appearance: alert and no distress Resp: clear to auscultation bilaterally Cardio: regular rate and rhythm GI: Soft, incision C/D/I, dark liquid in bag, ostomy dark, +BS   Assessment/Plan: S/p colostomy revision -- Will give 1 unit PRBC and runs of K+. Recheck labs tomorrow. Continue clears with continued nausea.    Freeman Caldron, PA-C Pager: [phone] 04/21/2013

## 2013-04-21 NOTE — Progress Notes (Signed)
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   Underwent the following procedures on 7/25:  COLOSTOMY REVISION REPAIR COLOSTOMY FISTULA  EXPLORATORY LAPAROTOMY PARTIAL COLECTOMY APPENDECTOMY  Patient with post-op ileus. + nausea, abdominal distention.  NGT to LIS restarted, however, out at this time.  Patient is receiving cyclic TPN with Clinimix E 5/15 over 14 hours:  84mL/hr x1 hour, then 111mL/hr over 12 hours, then 82mL/hr x1 hour.  Lipids (20% IVFE @ 17.8 ml/hr), multivitamins, and trace elements over 14 hours.  Provides 1861 total kcal and 96 grams protein daily.  Meets 100% estimated kcal and protein needs.  Height: Ht Readings from Last 1 Encounters:  04/18/13 5\' 4"  (1.626 m)    Weight Status:   Wt Readings from Last 1 Encounters:  04/20/13 133 lb 11.2 oz (60.646 kg)    Re-estimated needs:  Kcal: 1700-2000 Protein: 80-95 gm Fluid: per MD  Skin: LLQ colostomy  Diet Order: Clear Liquid   Intake/Output Summary (Last 24 hours) at 04/21/13 1304 Last data filed at 04/21/13 1145  Gross per 24 hour  Intake     50 ml  Output   1110 ml  Net  -1060 ml    Labs:   Recent Labs Lab 04/19/13 1157 04/20/13 0500 04/21/13 0635  NA 136 139 138  K 3.4* 3.8 3.0*  CL 101 105 100  CO2 26 27 28   BUN 6 12 16   CREATININE 0.69 0.74 0.64  CALCIUM 9.8 10.1 9.7  MG 1.7 2.0  --   PHOS 2.8 3.7  --   GLUCOSE 78 130* 121*    Scheduled Meds: . heparin subcutaneous  5,000 Units Subcutaneous Q8H  . HYDROmorphone PCA 0.3 mg/mL   Intravenous Q4H   . isosorbide-hydrALAZINE  1 tablet Oral BID  . lisinopril  40 mg Oral Daily  . potassium chloride  10 mEq Intravenous Q1 Hr x 5  . sodium chloride  10-40 mL Intracatheter Q12H  . spironolactone  12.5 mg Oral Daily    Continuous Infusions: . fat emulsion 250 mL (04/20/13 1740)   And  . Marland KitchenTPN (CLINIMIX-E) Adult    . Marland KitchenTPN (CLINIMIX-E) Adult     And  . fat emulsion    . dextrose 5 % and 0.45% NaCl 20 mL/hr at 04/19/13 1718    Maureen Chatters, RD, LDN Pager #: [phone] After-Hours Pager #: [phone]

## 2013-04-22 LAB — CBC
Hemoglobin: 8.6 g/dL — ABNORMAL LOW (ref 12.0–15.0)
RBC: 3.15 MIL/uL — ABNORMAL LOW (ref 3.87–5.11)

## 2013-04-22 LAB — BASIC METABOLIC PANEL
CO2: 27 mEq/L (ref 19–32)
Glucose, Bld: 124 mg/dL — ABNORMAL HIGH (ref 70–99)
Potassium: 3.4 mEq/L — ABNORMAL LOW (ref 3.5–5.1)
Sodium: 138 mEq/L (ref 135–145)

## 2013-04-22 MED ORDER — CARVEDILOL 6.25 MG PO TABS
18.7500 mg | ORAL_TABLET | Freq: Two times a day (BID) | ORAL | Status: DC
  Administered 2013-04-22 – 2013-05-02 (×17): 18.75 mg via ORAL
  Filled 2013-04-22 (×31): qty 1

## 2013-04-22 MED ORDER — FAT EMULSION 20 % IV EMUL
250.0000 mL | INTRAVENOUS | Status: AC
  Administered 2013-04-22: 250 mL via INTRAVENOUS
  Filled 2013-04-22: qty 250

## 2013-04-22 MED ORDER — HYDROMORPHONE HCL PF 1 MG/ML IJ SOLN
0.5000 mg | INTRAMUSCULAR | Status: DC | PRN
  Administered 2013-04-22: 1 mg via INTRAVENOUS
  Administered 2013-04-22 (×3): 2 mg via INTRAVENOUS
  Administered 2013-04-23 (×2): 1 mg via INTRAVENOUS
  Administered 2013-04-23: 2 mg via INTRAVENOUS
  Filled 2013-04-22 (×3): qty 1
  Filled 2013-04-22 (×4): qty 2

## 2013-04-22 MED ORDER — POTASSIUM CHLORIDE ER 10 MEQ PO TBCR
20.0000 meq | EXTENDED_RELEASE_TABLET | Freq: Three times a day (TID) | ORAL | Status: DC
  Administered 2013-04-22 – 2013-04-28 (×18): 20 meq via ORAL
  Filled 2013-04-22 (×9): qty 2
  Filled 2013-04-22: qty 1
  Filled 2013-04-22 (×17): qty 2

## 2013-04-22 MED ORDER — OXYCODONE HCL 5 MG PO TABS
10.0000 mg | ORAL_TABLET | ORAL | Status: DC | PRN
  Administered 2013-04-22 – 2013-04-24 (×5): 10 mg via ORAL
  Filled 2013-04-22 (×2): qty 2
  Filled 2013-04-22: qty 4
  Filled 2013-04-22 (×2): qty 2
  Filled 2013-04-22: qty 10
  Filled 2013-04-22: qty 2

## 2013-04-22 MED ORDER — TRACE MINERALS CR-CU-F-FE-I-MN-MO-SE-ZN IV SOLN
INTRAVENOUS | Status: AC
  Administered 2013-04-22: 17:00:00 via INTRAVENOUS
  Filled 2013-04-22: qty 2000

## 2013-04-22 MED ORDER — FUROSEMIDE 40 MG PO TABS
40.0000 mg | ORAL_TABLET | Freq: Every day | ORAL | Status: DC
  Administered 2013-04-22 – 2013-05-06 (×13): 40 mg via ORAL
  Filled 2013-04-22 (×17): qty 1

## 2013-04-22 NOTE — Progress Notes (Signed)
PARENTERAL NUTRITION CONSULT NOTE - Follow-up  Pharmacy Consult for TPN Indication: chronic malnutrition, on home TPN  Allergies  Allergen Reactions  . Penicillins Other (See Comments)    "anything w/penicillin in it makes me bleed"  . Labetalol Hcl Itching and Other (See Comments)    Bumps to bilateral arms.  . Morphine And Related Hives    Patient Measurements: Height: 5\' 4"  (162.6 cm) Weight: 133 lb 11.2 oz (60.646 kg) IBW/kg (Calculated) : 54.7   Vital Signs: Temp: 98.3 F (36.8 C) (07/31 0500) Temp src: Oral (07/31 0500) BP: 138/100 mmHg (07/31 0644) Pulse Rate: 80 (07/31 0644) Intake/Output from previous day: 07/30 0701 - 07/31 0700 In: 290 [P.O.:240; I.V.:50] Out: 3735 [Urine:3250; Drains:15; Stool:470] Intake/Output from this shift:    Labs:  Recent Labs  04/19/13 0920 04/21/13 0635 04/22/13 0535  WBC 11.6* 7.8 10.5  HGB 7.8* 6.9* 8.6*  HCT 23.9* 21.2* 25.5*  PLT 309 308 339  INR 1.23  --   --      Recent Labs  04/19/13 1157 04/20/13 0500 04/21/13 0635 04/22/13 0535  NA 136 139 138 138  K 3.4* 3.8 3.0* 3.4*  CL 101 105 100 103  CO2 26 27 28 27   GLUCOSE 78 130* 121* 124*  BUN 6 12 16 23   CREATININE 0.69 0.74 0.64 0.87  CALCIUM 9.8 10.1 9.7 9.9  MG 1.7 2.0  --   --   PHOS 2.8 3.7  --   --   PROT 6.1 6.1  --   --   ALBUMIN 2.7* 2.6*  --   --   AST 12 14  --   --   ALT 8 8  --   --   ALKPHOS 88 92  --   --   BILITOT 0.6 0.4  --   --   PREALBUMIN 11.2* 9.8*  --   --   TRIG 124 96  --   --    Estimated Creatinine Clearance: 76.5 ml/min (by C-G formula based on Cr of 0.87).   No results found for this basename: GLUCAP,  in the last 72 hours  Current Nutrition:  Cyclic TPN 1920 mls over 14 hours + lipids 17.8 ml/hr over 14 hours provides 1862 kcals and 96 gms of protein (meets 100% estimated needs) + clear liquid diet  Nutritional Goals:  1700-2000 kCal, 80-95 grams of protein per day per RD recommendations 7/30  Assessment:  Patient  known to pharmacy from past TPN consults. She has a prolapsed ostomy which is s/p repair she is now s/p colostomy revision, repair colostomy fistula, exp lap, Partial colectomy. Small bowel resection, appendectomy. Patient has extensive GI history.  She was on TPN per [facility] at home previously, but per [facility] she has been off TPN now for about 1 month. TPN resumed 7/28 PM for ileus. Diet advanced to full liquids from clear liquids today.  GI: s/p COLOSTOMY REVISION, REPAIR COLOSTOMY FISTULA (N/A)  EXPLORATORY LAPAROTOMY,Partial Colectomy,Small bowel resection (N/A)  APPENDECTOMY. has colostomy LUQ.  Has post-op ileus. NGT out 7/30 PM.  Ostomy o/p 470 cc/24h. Baseline prealbumin 11.2 on 04/19/13 - could reflect post-op stat.  Diet advanced to full liquids from clear liquids today.   Endo: no hx, has not required insulin in her TPN in the past, serum glucose acceptable at 124  Lytes: k up to 3.4 from 3 after 5 runs of k given yesterday.  Home dose of kdur 20 tid + lasix 40 po daily restarted today. Calcium  corrects to 11.02. (Ca x phos < 55).  Noted patient has accumulated lytes in the past and has required lyte free formula   Renal: SCr stable, CrCl ~79mL/min  Cards: cardiomyopathy (EF 5/10 20%, BNP 1234 5/28). On bidil, lisinipril, spironolactone, coreg, lasix, kdur; has LifeVest.   Hepatobil: LFTs wnl, admission albumin 3.3. Doppler 7/29 negative for DVT.  Best Practices: warfarin (DVT 5/22) currently on hold. Dr. [name] wants to reassess abd wound for bleeding before resuming; Hg up to 8.6 from 6.9 after 1 unit PRBC 7/30.  on sq heparin for VTE px  TPN Access: PICC   TPN day#: 3  (started 04/19/13)   Plan:  -  continue Clinimix E 5/15 with a 14 hr cycle at 18mL/hr x1 hour, then 164mL/hr over 12 hours, then 65mL/hr x1 hour for a total of over 14 hours  - Continue cyclic lipids at 17.8 ml/hr over 14 hours - Will continue to follow labs, po intake, and ability to advance diet Herby Abraham, Pharm.D. [phone] 04/22/2013 9:06 AM

## 2013-04-22 NOTE — Progress Notes (Signed)
Patient refused to ambulate. Encourage patient to at approximately 10:00am and 4:00pm and patient refused stating that she did not feel good.

## 2013-04-22 NOTE — Progress Notes (Signed)
GS Progress Note Subjective: Patient having a lot more abdominal pain this AM,  Says she is also sweating.  Objective: Vital signs in last 24 hours: Temp:  [97.8 F (36.6 C)-98.4 F (36.9 C)] 98.3 F (36.8 C) (07/31 0500) Pulse Rate:  [80-95] 80 (07/31 0644) Resp:  [11-23] 21 (07/31 0500) BP: (137-153)/(87-108) 138/100 mmHg (07/31 0644) SpO2:  [92 %-100 %] 99 % (07/31 0500)    Intake/Output from previous day: 07/30 0701 - 07/31 0700 In: 290 [P.O.:240; I.V.:50] Out: 3735 [Urine:3250; Drains:15; Stool:470] Intake/Output this shift:    Lungs: Clear.  Sats are okay  Abd: Soft, excellent bowel sounds.  Ostomy is working well.  No prolapse  Extremities: No change  Neuro: Intact  Lab Results: CBC   Recent Labs  04/21/13 0635 04/22/13 0535  WBC 7.8 10.5  HGB 6.9* 8.6*  HCT 21.2* 25.5*  PLT 308 339   BMET  Recent Labs  04/21/13 0635 04/22/13 0535  NA 138 138  K 3.0* 3.4*  CL 100 103  CO2 28 27  GLUCOSE 121* 124*  BUN 16 23  CREATININE 0.64 0.87  CALCIUM 9.7 9.9   PT/INR  Recent Labs  04/19/13 0920  LABPROT 15.2  INR 1.23   ABG No results found for this basename: PHART, PCO2, PO2, HCO3,  in the last 72 hours  Studies/Results: No results found.  Anti-infectives: Anti-infectives   Start     Dose/Rate Route Frequency Ordered Stop   04/16/13 2200  clindamycin (CLEOCIN) IVPB 900 mg     900 mg 100 mL/hr over 30 Minutes Intravenous 3 times per day 04/16/13 1807 04/16/13 2218   04/16/13 0922  clindamycin (CLEOCIN) 600 MG/50ML IVPB  Status:  Discontinued    Comments:  GALLMAN, KATHIE JEAN: cabinet override      04/16/13 0922 04/16/13 0925   04/16/13 0600  clindamycin (CLEOCIN) IVPB 900 mg     900 mg 100 mL/hr over 30 Minutes Intravenous On call to O.R. 04/15/13 1433 04/16/13 1227   04/16/13 0600  gentamicin (GARAMYCIN) 280.4 mg in dextrose 5 % 100 mL IVPB     5 mg/kg  56.1 kg 107 mL/hr over 60 Minutes Intravenous On call to O.R. 04/15/13 1433  04/16/13 1221      Assessment/Plan: s/p Procedure(s): COLOSTOMY REVISION, REPAIR COLOSTOMY FISTULA EXPLORATORY LAPAROTOMY,Partial Colectomy,Small bowel resection APPENDECTOMY Advance diet DC PCA and start oral medications Restart more of her home medications. Plan to stop TPM when PO intake is much better.  LOS: 6 days    Marta Lamas. [name], MD, FACS [phone] [facility] Surgery 04/22/2013

## 2013-04-22 NOTE — Progress Notes (Signed)
PT Cancellation Note  Patient Details Name: Vanessa [name] MRN: [id number] DOB: 1976/05/26   Cancelled Treatment:    Reason Eval/Treat Not Completed: Patient declined, no reason specified. Patient aware that this has been her 2nd refusal for therapy and that if she refuses a 3rd time that we will sign off. Educated her on the benefits of mobility. RN aware. She tells me that she will get up tomorrow.    Ludger Nutting 04/22/2013, 4:18 PM  Pager: [phone]

## 2013-04-23 LAB — BASIC METABOLIC PANEL
BUN: 21 mg/dL (ref 6–23)
GFR calc Af Amer: >90 mL/min (ref >90)
GFR calc non Af Amer: >90 mL/min (ref >90)
Potassium: 3.2 mEq/L — ABNORMAL LOW (ref 3.5–5.1)

## 2013-04-23 MED ORDER — TRACE MINERALS CR-CU-F-FE-I-MN-MO-SE-ZN IV SOLN
INTRAVENOUS | Status: AC
  Administered 2013-04-23: 17:00:00 via INTRAVENOUS
  Filled 2013-04-23: qty 2000

## 2013-04-23 MED ORDER — HYDROMORPHONE HCL PF 1 MG/ML IJ SOLN
0.5000 mg | Freq: Four times a day (QID) | INTRAMUSCULAR | Status: DC | PRN
  Administered 2013-04-23 – 2013-04-24 (×5): 2 mg via INTRAVENOUS
  Filled 2013-04-23 (×6): qty 2

## 2013-04-23 MED ORDER — FAT EMULSION 20 % IV EMUL
250.0000 mL | INTRAVENOUS | Status: AC
  Administered 2013-04-23: 250 mL via INTRAVENOUS
  Filled 2013-04-23: qty 250

## 2013-04-23 MED ORDER — POTASSIUM CHLORIDE CRYS ER 20 MEQ PO TBCR
40.0000 meq | EXTENDED_RELEASE_TABLET | Freq: Once | ORAL | Status: AC
  Administered 2013-04-23: 40 meq via ORAL
  Filled 2013-04-23: qty 2

## 2013-04-23 MED ORDER — WHITE PETROLATUM GEL
Status: AC
  Administered 2013-04-23: 12:00:00
  Filled 2013-04-23: qty 5

## 2013-04-23 NOTE — Progress Notes (Addendum)
PARENTERAL NUTRITION CONSULT NOTE - Follow-up  Pharmacy Consult for TPN Indication: chronic malnutrition, on home TPN  Allergies  Allergen Reactions  . Penicillins Other (See Comments)    "anything w/penicillin in it makes me bleed"  . Labetalol Hcl Itching and Other (See Comments)    Bumps to bilateral arms.  . Morphine And Related Hives    Patient Measurements: Height: 5\' 4"  (162.6 cm) Weight: 133 lb 11.2 oz (60.646 kg) IBW/kg (Calculated) : 54.7   Vital Signs: Temp: 97.7 F (36.5 C) (08/01 0917) Temp src: Oral (08/01 0917) BP: 132/90 mmHg (08/01 0917) Pulse Rate: 79 (08/01 0917) Intake/Output from previous day: 07/31 0701 - 08/01 0700 In: 2855 [P.O.:600; I.V.:160; TPN:2095] Out: 1257 [Urine:450; Drains:7; Stool:800] Intake/Output from this shift:    Labs:  Recent Labs  04/21/13 0635 04/22/13 0535  WBC 7.8 10.5  HGB 6.9* 8.6*  HCT 21.2* 25.5*  PLT 308 339     Recent Labs  04/21/13 0635 04/22/13 0535 04/23/13 0800  NA 138 138 138  K 3.0* 3.4* 3.2*  CL 100 103 103  CO2 28 27 28   GLUCOSE 121* 124* 109*  BUN 16 23 21   CREATININE 0.64 0.87 0.70  CALCIUM 9.7 9.9 9.7   Estimated Creatinine Clearance: 83.1 ml/min (by C-G formula based on Cr of 0.7).   No results found for this basename: GLUCAP,  in the last 72 hours  Current Nutrition:  Cyclic TPN 1920 mls over 14 hours + lipids 17.8 ml/hr over 14 hours provides 1862 kcals and 96 gms of protein (meets 100% estimated needs) + full liquid diet  Nutritional Goals:  1700-2000 kCal, 80-95 grams of protein per day per RD recommendations 7/30  Assessment:  Patient known to pharmacy from past TPN consults. She has a prolapsed ostomy which is s/p repair she is now s/p colostomy revision, repair colostomy fistula, exp lap, Partial colectomy. Small bowel resection, appendectomy. Patient has extensive GI history.  She was on TPN per [facility] at home previously, but per [facility] she has been off TPN now for about 1 month. TPN  resumed 7/28 PM for ileus. Diet advanced to full liquids from clear liquids 7/31. PO intake recorded 600 ml.  Plan to stop TPN when PO intake is much better.   GI: s/p COLOSTOMY REVISION, REPAIR COLOSTOMY FISTULA (N/A)  EXPLORATORY LAPAROTOMY,Partial Colectomy,Small bowel resection (N/A)  APPENDECTOMY. has colostomy LUQ.  Has post-op ileus. NGT out 7/30 PM.  Ostomy o/p 800 cc/24h. Baseline prealbumin 11.2 on 04/19/13 - could reflect post-op stat.  Diet advanced to full liquids from clear liquids 7/31.  PO intake recorded: 600 ml.     Endo: no hx, has not required insulin in her TPN in the past, serum glucose acceptable at 109  Lytes: k down to 3.2.  Home dose of kdur 20 tid + lasix 40 po daily restarted 7/31. Calcium corrects to 11.02. (Ca x phos < 55).  Noted patient has accumulated lytes in the past and has required lyte free formula   Renal: SCr stable, CrCl ~68mL/min  Cards: cardiomyopathy (EF 5/10 20%, BNP 1234 5/28). On bidil, lisinipril, spironolactone, coreg, lasix, kdur; has LifeVest.   Hepatobil: LFTs wnl, admission albumin 3.3. Doppler 7/29 negative for DVT.  Best Practices: warfarin (DVT 5/22)  Dr. [name] does NOT plan to resume warf 2nd no dVT 7/29 doppler. Hg up to 8.6 from 6.9 after 1 unit PRBC 7/30.  on sq heparin for VTE px  TPN Access: PICC   TPN day#: 4 (  started 04/19/13)   Plan:  -  continue Clinimix E 5/15 with a 14 hr cycle at 41mL/hr x1 hour, then 151mL/hr over 12 hours, then 48mL/hr x1 hour for a total of over 14 hours  - give 40 meq kdur extra in addition to 20 po tid scheduled for K 3.2 and f/u am bmet - Continue cyclic lipids at 17.8 ml/hr over 14 hours - Will continue to follow labs, po intake, and ability to advance diet Herby Abraham, Pharm.D. [phone] 04/23/2013 9:47 AM

## 2013-04-23 NOTE — Progress Notes (Signed)
GS Progress Note Subjective: Patient is all smiles today.  Eating well.  No nausea or vomiting.  Much less abdominal pain.Ostomy is putting out a lot of gas and stool.  Objective: Vital signs in last 24 hours: Temp:  [97.7 F (36.5 C)-99 F (37.2 C)] 98.6 F (37 C) (08/01 1353) Pulse Rate:  [77-92] 77 (08/01 1353) Resp:  [18-20] 20 (08/01 1353) BP: (131-152)/(85-102) 131/85 mmHg (08/01 1353) SpO2:  [100 %] 100 % (08/01 1353) Last BM Date: 04/23/13  Intake/Output from previous day: 07/31 0701 - 08/01 0700 In: 2855 [P.O.:600; I.V.:160; TPN:2095] Out: 1257 [Urine:450; Drains:7; Stool:800] Intake/Output this shift: Total I/O In: 360 [P.O.:360] Out: 700 [Urine:300; Stool:400]  Lungs: Clear   Abd: Soft, good bowel sounds.  Wound is okay  Extremities: Intact, not DVT signs or symptoms  Neuro: Intact  Lab Results: CBC   Recent Labs  04/21/13 0635 04/22/13 0535  WBC 7.8 10.5  HGB 6.9* 8.6*  HCT 21.2* 25.5*  PLT 308 339   BMET  Recent Labs  04/22/13 0535 04/23/13 0800  NA 138 138  K 3.4* 3.2*  CL 103 103  CO2 27 28  GLUCOSE 124* 109*  BUN 23 21  CREATININE 0.87 0.70  CALCIUM 9.9 9.7   PT/INR No results found for this basename: LABPROT, INR,  in the last 72 hours ABG No results found for this basename: PHART, PCO2, PO2, HCO3,  in the last 72 hours  Studies/Results: No results found.  Anti-infectives: Anti-infectives   Start     Dose/Rate Route Frequency Ordered Stop   04/16/13 2200  clindamycin (CLEOCIN) IVPB 900 mg     900 mg 100 mL/hr over 30 Minutes Intravenous 3 times per day 04/16/13 1807 04/16/13 2218   04/16/13 0922  clindamycin (CLEOCIN) 600 MG/50ML IVPB  Status:  Discontinued    Comments:  GALLMAN, KATHIE JEAN: cabinet override      04/16/13 0922 04/16/13 0925   04/16/13 0600  clindamycin (CLEOCIN) IVPB 900 mg     900 mg 100 mL/hr over 30 Minutes Intravenous On call to O.R. 04/15/13 1433 04/16/13 1227   04/16/13 0600  gentamicin  (GARAMYCIN) 280.4 mg in dextrose 5 % 100 mL IVPB     5 mg/kg  56.1 kg 107 mL/hr over 60 Minutes Intravenous On call to O.R. 04/15/13 1433 04/16/13 1221      Assessment/Plan: s/p Procedure(s): COLOSTOMY REVISION, REPAIR COLOSTOMY FISTULA EXPLORATORY LAPAROTOMY,Partial Colectomy,Small bowel resection APPENDECTOMY Advance diet Stop TPN in the AM after tonight's bag Possible home on Sunday.  LOS: 7 days    Marta Lamas. [name], MD, FACS [phone] [facility] Surgery 04/23/2013

## 2013-04-23 NOTE — Progress Notes (Signed)
Physical Therapy Treatment Patient Details Name: Vanessa [name] MRN: [id number] DOB: 1976/06/29 Today's Date: 04/23/2013 Time: 7829-5621 PT Time Calculation (min): 39 min  PT Assessment / Plan / Recommendation  History of Present Illness Pt s/p colostomy, colectomy, appendectomy   PT Comments   Pt mobilizing slowly but well. She denies any concerns re: her mobility when she returns home. She is undecided if she wants a RW for home. If it will incr her confidence and overall mobility, then a RW would be beneficial. No further acute PT needs identified. PT signing off.   Follow Up Recommendations  No PT follow up;Supervision for mobility/OOB     Does the patient have the potential to tolerate intense rehabilitation     Barriers to Discharge        Equipment Recommendations  Rolling walker with 5" wheels vs None (pt is undecided)   Recommendations for Other Services    Frequency     Progress towards PT Goals Progress towards PT goals: Goals met/education completed, patient discharged from PT  Plan Discharge plan needs to be updated    Precautions / Restrictions Precautions Precautions: Fall Precaution Comments: jp drain Restrictions Weight Bearing Restrictions: No   Pertinent Vitals/Pain 8/10 abd; RN notified and states will provide pain medicine; patient repositioned for comfort     Mobility  Bed Mobility Bed Mobility: Supine to Sit;Sitting - Scoot to Edge of Bed;Sit to Supine Supine to Sit: 6: Modified independent (Device/Increase time);HOB elevated Sitting - Scoot to Edge of Bed: 7: Independent Sit to Supine: 6: Modified independent (Device/Increase time);HOB elevated Details for Bed Mobility Assistance: prefers HOB elevated due to abd pain; has the strength to do it with HOB 0 Transfers Transfers: Sit to Stand;Stand to Sit;Stand Pivot Transfers Sit to Stand: 6: Modified independent (Device/Increase time) Stand to Sit: 6: Modified independent (Device/Increase  time) Stand Pivot Transfers: 5: Supervision (no device) Details for Transfer Assistance: with RW mod indep; without device supervision due to pt reaching to grasp furniture and reports she feels unsafe Ambulation/Gait Ambulation/Gait Assistance: 6: Modified independent (Device/Increase time) Ambulation Distance (Feet): 300 Feet Assistive device: Rolling walker Ambulation/Gait Assistance Details: cueing for posture and position in RW Gait Pattern: Step-through pattern;Decreased stride length;Trunk flexed    Exercises     PT Diagnosis:    PT Problem List:   PT Treatment Interventions:     PT Goals (current goals can now be found in the care plan section) Acute Rehab PT Goals Patient Stated Goal: to go home  Visit Information  Last PT Received On: 04/23/13 Assistance Needed: +1 History of Present Illness: Pt s/p colostomy, colectomy, appendectomy    Subjective Data  Subjective: Reports she has no concerns re: her mobility when she goes home.  Patient Stated Goal: to go home   Cognition  Cognition Arousal/Alertness: Awake/alert Behavior During Therapy: WFL for tasks assessed/performed Overall Cognitive Status: Within Functional Limits for tasks assessed    Balance     End of Session PT - End of Session Activity Tolerance: Patient tolerated treatment well Patient left: in bed;with call bell/phone within reach Nurse Communication: Mobility status;Other (comment) (d/c from PT due to no skilled needs)   GP     Vanessa [name] 04/23/2013, 12:37 PM Pager [phone]

## 2013-04-23 NOTE — Clinical Documentation Improvement (Signed)
THIS DOCUMENT IS NOT A PERMANENT PART OF THE MEDICAL RECORD  Please update your documentation with the medical record to reflect your response to this query. If you need help knowing how to do this please call [phone].  04/23/13   Dear Dr. [name] / Associates,  In a better effort to capture your patient's severity of illness, reflect appropriate length of stay and utilization of resources, a review of the patient medical record has revealed the following indicators.    Based on your clinical judgment, please clarify and document in a progress note and/or discharge summary the clinical condition associated with the following supporting information:  In responding to this query please exercise your independent judgment.  The fact that a query is asked, does not imply that any particular answer is desired or expected.   Possible Clinical Conditions?   ___X____Moderate Malnutrition _______Severe Malnutrition   _______Protein Calorie Malnutrition _______Severe Protein Calorie Malnutrition   _______Other Condition _______Cannot clinically determine     Risk Factors: Patient with chronic malnutrition, on home TPN per 7/28 pharmacy consult.  Signs & Symptoms: Ht: 5'4"      Wt: 136 lb 11 oz  BMI: 23.45   Treatment  Enteral Feeding: Per 7/28 RD note, patient receiving TPN prior to admit up until 1 month ago. TPN ordered to resume 7/28.   Nutrition Consult: 04/19/13: Altered GI function related to fistula as evidenced by the need for TPN.   You may use possible, probable, or suspect with inpatient documentation. possible, probable, suspected diagnoses MUST be documented at the time of discharge  Reviewed:  no additional documentation provided  Thank You,  Marciano Sequin,  Clinical Documentation Specialist: [phone] [facility] Information Management [place]

## 2013-04-24 LAB — URINALYSIS, ROUTINE W REFLEX MICROSCOPIC
Protein, ur: NEGATIVE mg/dL
Urobilinogen, UA: 0.2 mg/dL (ref 0.0–1.0)

## 2013-04-24 LAB — BASIC METABOLIC PANEL
BUN: 19 mg/dL (ref 6–23)
Creatinine, Ser: 0.52 mg/dL (ref 0.50–1.10)
GFR calc Af Amer: >90 mL/min (ref >90)
GFR calc non Af Amer: >90 mL/min (ref >90)
Potassium: 3.1 mEq/L — ABNORMAL LOW (ref 3.5–5.1)

## 2013-04-24 LAB — CBC WITH DIFFERENTIAL/PLATELET
Basophils Absolute: 0.1 10*3/uL (ref 0.0–0.1)
Eosinophils Absolute: 0.3 10*3/uL (ref 0.0–0.7)
Hemoglobin: 8.9 g/dL — ABNORMAL LOW (ref 12.0–15.0)
Lymphocytes Relative: 13 % (ref 12–46)
MCHC: 33.7 g/dL (ref 30.0–36.0)
Neutrophils Relative %: 72 % (ref 43–77)
Platelets: 423 10*3/uL — ABNORMAL HIGH (ref 150–400)
RDW: 17.4 % — ABNORMAL HIGH (ref 11.5–15.5)

## 2013-04-24 LAB — URINE MICROSCOPIC-ADD ON

## 2013-04-24 MED ORDER — HYDROMORPHONE HCL PF 1 MG/ML IJ SOLN
0.5000 mg | INTRAMUSCULAR | Status: DC | PRN
  Administered 2013-04-24: 2 mg via INTRAVENOUS
  Administered 2013-04-25: 1 mg via INTRAVENOUS
  Administered 2013-04-25 – 2013-04-28 (×22): 2 mg via INTRAVENOUS
  Administered 2013-04-28 (×2): 1 mg via INTRAVENOUS
  Administered 2013-04-28 – 2013-04-29 (×6): 2 mg via INTRAVENOUS
  Administered 2013-04-29 (×2): 1 mg via INTRAVENOUS
  Administered 2013-04-29 – 2013-04-30 (×6): 2 mg via INTRAVENOUS
  Administered 2013-04-30 (×2): 1 mg via INTRAVENOUS
  Administered 2013-04-30 (×3): 2 mg via INTRAVENOUS
  Administered 2013-04-30 – 2013-05-01 (×2): 1 mg via INTRAVENOUS
  Administered 2013-05-01: 2 mg via INTRAVENOUS
  Administered 2013-05-01 – 2013-05-03 (×19): 1 mg via INTRAVENOUS
  Administered 2013-05-03 – 2013-05-04 (×3): 2 mg via INTRAVENOUS
  Administered 2013-05-04: 1 mg via INTRAVENOUS
  Administered 2013-05-04: 2 mg via INTRAVENOUS
  Administered 2013-05-04: 1 mg via INTRAVENOUS
  Administered 2013-05-04: 2 mg via INTRAVENOUS
  Administered 2013-05-04 (×3): 1 mg via INTRAVENOUS
  Administered 2013-05-05: 2 mg via INTRAVENOUS
  Administered 2013-05-05: 1 mg via INTRAVENOUS
  Administered 2013-05-05: 2 mg via INTRAVENOUS
  Administered 2013-05-05: 1 mg via INTRAVENOUS
  Administered 2013-05-05: 2 mg via INTRAVENOUS
  Filled 2013-04-24: qty 1
  Filled 2013-04-24 (×4): qty 2
  Filled 2013-04-24 (×2): qty 1
  Filled 2013-04-24: qty 2
  Filled 2013-04-24 (×2): qty 1
  Filled 2013-04-24 (×4): qty 2
  Filled 2013-04-24 (×2): qty 1
  Filled 2013-04-24 (×3): qty 2
  Filled 2013-04-24: qty 1
  Filled 2013-04-24 (×3): qty 2
  Filled 2013-04-24: qty 1
  Filled 2013-04-24: qty 2
  Filled 2013-04-24: qty 1
  Filled 2013-04-24 (×4): qty 2
  Filled 2013-04-24 (×2): qty 1
  Filled 2013-04-24 (×2): qty 2
  Filled 2013-04-24 (×3): qty 1
  Filled 2013-04-24 (×4): qty 2
  Filled 2013-04-24 (×2): qty 1
  Filled 2013-04-24 (×2): qty 2
  Filled 2013-04-24 (×3): qty 1
  Filled 2013-04-24 (×2): qty 2
  Filled 2013-04-24 (×2): qty 1
  Filled 2013-04-24: qty 2
  Filled 2013-04-24: qty 1
  Filled 2013-04-24: qty 2
  Filled 2013-04-24: qty 1
  Filled 2013-04-24: qty 2
  Filled 2013-04-24: qty 1
  Filled 2013-04-24: qty 2
  Filled 2013-04-24: qty 1
  Filled 2013-04-24 (×8): qty 2
  Filled 2013-04-24 (×4): qty 1
  Filled 2013-04-24 (×5): qty 2
  Filled 2013-04-24 (×6): qty 1
  Filled 2013-04-24 (×2): qty 2

## 2013-04-24 MED ORDER — OXYCODONE HCL 5 MG PO TABS
20.0000 mg | ORAL_TABLET | ORAL | Status: DC | PRN
  Administered 2013-04-24 – 2013-05-05 (×5): 20 mg via ORAL
  Filled 2013-04-24 (×4): qty 4

## 2013-04-24 MED ORDER — ONDANSETRON HCL 4 MG/2ML IJ SOLN
4.0000 mg | Freq: Four times a day (QID) | INTRAMUSCULAR | Status: DC
  Administered 2013-04-24 – 2013-05-06 (×47): 4 mg via INTRAVENOUS
  Filled 2013-04-24 (×47): qty 2

## 2013-04-24 MED ORDER — POTASSIUM CHLORIDE 10 MEQ/50ML IV SOLN
10.0000 meq | INTRAVENOUS | Status: AC
  Administered 2013-04-24 (×3): 10 meq via INTRAVENOUS
  Filled 2013-04-24 (×3): qty 50

## 2013-04-24 MED ORDER — PROMETHAZINE HCL 25 MG/ML IJ SOLN
12.5000 mg | Freq: Three times a day (TID) | INTRAMUSCULAR | Status: DC | PRN
  Administered 2013-04-24 – 2013-05-09 (×17): 12.5 mg via INTRAVENOUS
  Filled 2013-04-24 (×17): qty 1

## 2013-04-24 NOTE — Progress Notes (Signed)
GS Progress Note Subjective: Patient developed nausea and vomiting last night.  Amount of emesis undocumented, but patient states that it was not a lot.  Objective: Vital signs in last 24 hours: Temp:  [97.7 F (36.5 C)-99.2 F (37.3 C)] 98.3 F (36.8 C) (08/02 0622) Pulse Rate:  [73-79] 73 (08/02 0622) Resp:  [18-20] 18 (08/02 0622) BP: (115-132)/(69-90) 115/69 mmHg (08/02 0622) SpO2:  [99 %-100 %] 99 % (08/02 0622) Weight:  [53.797 kg (118 lb 9.6 oz)] 53.797 kg (118 lb 9.6 oz) (08/02 0500) Last BM Date: 04/23/13  Intake/Output from previous day: 08/01 0701 - 08/02 0700 In: 3156.9 [P.O.:960; I.V.:329.7; ZOX:0960.4] Out: 2930 [Urine:900; Emesis/NG output:1000; Drains:5; Stool:1025] Intake/Output this shift:    Lungs: Clear  Abd: Good bowel sounds and ostomy output is good.  Gas and liquid stool  Extremities: No clinical signs of DVT  Neuro: Intact  Lab Results: CBC   Recent Labs  04/22/13 0535 04/24/13 0601  WBC 10.5 13.2*  HGB 8.6* 8.9*  HCT 25.5* 26.4*  PLT 339 423*   BMET  Recent Labs  04/23/13 0800 04/24/13 0601  NA 138 137  K 3.2* 3.1*  CL 103 98  CO2 28 28  GLUCOSE 109* 116*  BUN 21 19  CREATININE 0.70 0.52  CALCIUM 9.7 9.6   PT/INR No results found for this basename: LABPROT, INR,  in the last 72 hours ABG No results found for this basename: PHART, PCO2, PO2, HCO3,  in the last 72 hours  Studies/Results: No results found.  Anti-infectives: Anti-infectives   Start     Dose/Rate Route Frequency Ordered Stop   04/16/13 2200  clindamycin (CLEOCIN) IVPB 900 mg     900 mg 100 mL/hr over 30 Minutes Intravenous 3 times per day 04/16/13 1807 04/16/13 2218   04/16/13 0922  clindamycin (CLEOCIN) 600 MG/50ML IVPB  Status:  Discontinued    Comments:  GALLMAN, KATHIE JEAN: cabinet override      04/16/13 0922 04/16/13 0925   04/16/13 0600  clindamycin (CLEOCIN) IVPB 900 mg     900 mg 100 mL/hr over 30 Minutes Intravenous On call to O.R. 04/15/13  1433 04/16/13 1227   04/16/13 0600  gentamicin (GARAMYCIN) 280.4 mg in dextrose 5 % 100 mL IVPB     5 mg/kg  56.1 kg 107 mL/hr over 60 Minutes Intravenous On call to O.R. 04/15/13 1433 04/16/13 1221      Assessment/Plan: s/p Procedure(s): COLOSTOMY REVISION, REPAIR COLOSTOMY FISTULA EXPLORATORY LAPAROTOMY,Partial Colectomy,Small bowel resection APPENDECTOMY Scheduled Zofran Discontinue TPN for now. Keep on diet.  Nausea and vomiting may have been related to oral pain medications.  LOS: 8 days    Marta Lamas. [name], MD, FACS [phone] [facility] Surgery 04/24/2013

## 2013-04-24 NOTE — Progress Notes (Signed)
PARENTERAL NUTRITION CONSULT NOTE - Follow-up  Pharmacy Consult for TPN Indication: chronic malnutrition, on home TPN  Allergies  Allergen Reactions  . Penicillins Other (See Comments)    "anything w/penicillin in it makes me bleed"  . Labetalol Hcl Itching and Other (See Comments)    Bumps to bilateral arms.  . Morphine And Related Hives    Patient Measurements: Height: 5\' 4"  (162.6 cm) Weight: 118 lb 9.6 oz (53.797 kg) IBW/kg (Calculated) : 54.7   Vital Signs: Temp: 98.3 F (36.8 C) (08/02 0622) Temp src: Oral (08/01 2155) BP: 115/69 mmHg (08/02 0622) Pulse Rate: 73 (08/02 0622) Intake/Output from previous day: 08/01 0701 - 08/02 0700 In: 3156.9 [P.O.:960; I.V.:329.7; ZOX:0960.4] Out: 2930 [Urine:900; Emesis/NG output:1000; Drains:5; Stool:1025] Intake/Output from this shift:    Labs:  Recent Labs  04/22/13 0535 04/24/13 0601  WBC 10.5 13.2*  HGB 8.6* 8.9*  HCT 25.5* 26.4*  PLT 339 423*     Recent Labs  04/22/13 0535 04/23/13 0800 04/24/13 0601  NA 138 138 137  K 3.4* 3.2* 3.1*  CL 103 103 98  CO2 27 28 28   GLUCOSE 124* 109* 116*  BUN 23 21 19   CREATININE 0.87 0.70 0.52  CALCIUM 9.9 9.7 9.6   Estimated Creatinine Clearance: 81.8 ml/min (by C-G formula based on Cr of 0.52).    Recent Labs  04/23/13 0929  GLUCAP 110*    Current Nutrition:  Cyclic TPN 1920 mls over 14 hours + lipids 17.8 ml/hr over 14 hours provides 1862 kcals and 96 gms of protein (meets 100% estimated needs) + reg diet  Nutritional Goals:  1700-2000 kCal, 80-95 grams of protein per day per RD recommendations 7/30  Assessment:  Patient known to pharmacy from past TPN consults. She has a prolapsed ostomy which is s/p repair she is now s/p colostomy revision, repair colostomy fistula, exp lap, Partial colectomy. Small bowel resection, appendectomy. Patient has extensive GI history.  She was on TPN per [facility] at home previously, but per [facility] she has been off TPN now for about 1  month. TPN resumed 7/28 PM for ileus. Pt on regular diet with some N/V last PM.  MD has dc'd TNA today.  GI: s/p COLOSTOMY REVISION, REPAIR COLOSTOMY FISTULA (N/A)  EXPLORATORY LAPAROTOMY,Partial Colectomy,Small bowel resection (N/A)  APPENDECTOMY. has colostomy LUQ.  NGT out 7/30 PM.  . Baseline prealbumin 11.2 on 04/19/13 - could reflect post-op stat. On regular diet  Endo: no hx, has not required insulin in her TPN in the past, serum glucose acceptable at 116 Lytes: k down to 3.1 after extra dose of kdur 40 meq yesterday.  Home dose of kdur 20 tid + lasix 40 po daily restarted 7/31. Calcium corrects to 11.02. (Ca x phos < 55).  Noted patient has accumulated lytes in the past and has required lyte free formula   Renal: SCr stable, CrCl ~33mL/min  Cards: cardiomyopathy (EF 5/10 20%, BNP 1234 5/28). On bidil, lisinipril, spironolactone, coreg, lasix, kdur; has LifeVest.   Hepatobil: LFTs wnl, admission albumin 3.3. Doppler 7/29 negative for DVT.  Best Practices: warfarin (DVT 5/22)  Dr. [name] does NOT plan to resume warf 2nd no dVT 7/29 doppler. Hg up to 8.6 from 6.9 after 1 unit PRBC 7/30.  on sq heparin for VTE px  TPN Access: PICC   TPN day#: 5 (started 04/19/13)   Plan:  -  TPN has been dc'd this am by Dr. [name].  She is on a regular diet  - give  3 runs of KCL addition to 20 po tid scheduled for K 3.1 per Dr. Lizbeth Bark, Pharm.D. [phone] 04/24/2013 8:15 AM

## 2013-04-24 NOTE — Progress Notes (Signed)
NP made aware of patient episode of vomiting, no new order and will continue to monitor patient.

## 2013-04-24 NOTE — Progress Notes (Addendum)
Pt crying,asking for pain med.  States pain is making her nauseous so she refuses oxycodone.  Pt was medicated with 2mg  IV Dilaudid at 1800. She can have it again at midnight.  Pt's abd is soft. Emptied 200 cc liquid brown stool from ostomy.  Pt asking for MD to be called so she can have IV pain med.MD paged.

## 2013-04-25 ENCOUNTER — Inpatient Hospital Stay (HOSPITAL_COMMUNITY): Payer: Medicaid Other

## 2013-04-25 LAB — CBC WITH DIFFERENTIAL/PLATELET
Basophils Absolute: 0.2 10*3/uL — ABNORMAL HIGH (ref 0.0–0.1)
Basophils Relative: 1 % (ref 0–1)
HCT: 28.5 % — ABNORMAL LOW (ref 36.0–46.0)
Hemoglobin: 9.4 g/dL — ABNORMAL LOW (ref 12.0–15.0)
Lymphs Abs: 2.1 10*3/uL (ref 0.7–4.0)
MCHC: 33 g/dL (ref 30.0–36.0)
MCV: 80.5 fL (ref 78.0–100.0)
Monocytes Relative: 11 % (ref 3–12)
Neutro Abs: 12 10*3/uL — ABNORMAL HIGH (ref 1.7–7.7)
RDW: 17.6 % — ABNORMAL HIGH (ref 11.5–15.5)
WBC: 16.3 10*3/uL — ABNORMAL HIGH (ref 4.0–10.5)

## 2013-04-25 LAB — TYPE AND SCREEN
Antibody Screen: NEGATIVE
Unit division: 0

## 2013-04-25 LAB — BASIC METABOLIC PANEL
BUN: 14 mg/dL (ref 6–23)
Chloride: 94 mEq/L — ABNORMAL LOW (ref 96–112)
Creatinine, Ser: 0.77 mg/dL (ref 0.50–1.10)
GFR calc Af Amer: >90 mL/min (ref >90)
Glucose, Bld: 89 mg/dL (ref 70–99)

## 2013-04-25 MED ORDER — LEVOFLOXACIN IN D5W 500 MG/100ML IV SOLN
500.0000 mg | INTRAVENOUS | Status: DC
  Administered 2013-04-25 – 2013-05-05 (×11): 500 mg via INTRAVENOUS
  Filled 2013-04-25 (×12): qty 100

## 2013-04-25 MED ORDER — IOHEXOL 300 MG/ML  SOLN: 25.0000 mL | INTRAMUSCULAR | Status: AC

## 2013-04-25 MED ORDER — POTASSIUM CHLORIDE 10 MEQ/50ML IV SOLN
10.0000 meq | INTRAVENOUS | Status: AC
  Administered 2013-04-25 (×2): 10 meq via INTRAVENOUS
  Filled 2013-04-25 (×3): qty 50

## 2013-04-25 MED ORDER — MIDAZOLAM HCL 2 MG/2ML IJ SOLN
INTRAMUSCULAR | Status: AC
  Filled 2013-04-25: qty 4

## 2013-04-25 MED ORDER — MIDAZOLAM HCL 2 MG/2ML IJ SOLN
INTRAMUSCULAR | Status: AC | PRN
  Administered 2013-04-25 (×2): 1 mg via INTRAVENOUS

## 2013-04-25 MED ORDER — LIDOCAINE HCL 1 % IJ SOLN
INTRAMUSCULAR | Status: AC
  Filled 2013-04-25: qty 10

## 2013-04-25 MED ORDER — SODIUM CHLORIDE 0.9 % IV SOLN
INTRAVENOUS | Status: AC
  Administered 2013-04-25 – 2013-04-26 (×2): via INTRAVENOUS

## 2013-04-25 MED ORDER — HYDROMORPHONE HCL PF 1 MG/ML IJ SOLN
INTRAMUSCULAR | Status: AC
  Filled 2013-04-25: qty 2

## 2013-04-25 MED ORDER — FENTANYL CITRATE 0.05 MG/ML IJ SOLN
INTRAMUSCULAR | Status: AC
  Filled 2013-04-25: qty 4

## 2013-04-25 MED ORDER — IOHEXOL 300 MG/ML  SOLN
100.0000 mL | Freq: Once | INTRAMUSCULAR | Status: AC | PRN
  Administered 2013-04-25: 100 mL via INTRAVENOUS

## 2013-04-25 MED ORDER — FENTANYL CITRATE 0.05 MG/ML IJ SOLN
INTRAMUSCULAR | Status: AC | PRN
  Administered 2013-04-25 (×3): 25 ug via INTRAVENOUS

## 2013-04-25 NOTE — ED Notes (Signed)
Airway assessment by Dr Archer Asa 1

## 2013-04-25 NOTE — ED Notes (Signed)
O2 d/c'd 

## 2013-04-25 NOTE — Progress Notes (Signed)
37yo female admitted for fistulous tract and prolapse colostomy now w/ abnormal UA, to begin Levaquin.  Will start Levaquin 500mg  IV Q24H for CrCl ~80 ml/min and monitor CBC, Cx, clinical progression.  Vernard Gambles, PharmD, BCPS 04/25/2013 6:56 AM

## 2013-04-25 NOTE — Consult Note (Signed)
Interventional Radiology Consult Note  Reason for Consult: Abdominal abcsess Referring Physician: Harvie Junior, MD   HPI: Vanessa RUFINO is an 37 y.o. 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  She underwent revision surgery (partial colectomy, partial SB resection, appendectomy, enterolyisis, and colostomy revision)  on 7/25 by Dr. [name].  CT scan today shows a large intra-abdominal fluid and gas collection with extravasation through a defect in the small bowel concerning for dehiscence of the small bowel suture line/anastomosis.  Per surgery she is not currently a surgical candidate.  A percutaneous drain is requested as a temporizing measure.  There is a high likelihood of developing a prolonged small bowel fistula, however this appears to be her only option at this point.   Past Medical History:  Past Medical History  Diagnosis Date  . History of blood transfusion     "I had ~ 7 in 01/2012"  . Anemia   . Radiation Nov.18,2011-Jan.23,2012    cervix  . Hypertension     no treatment necessary for BP  since July 2013  . Non-ischemic cardiomyopathy, EF 25 % 02/14/2013  . At risk for sudden cardiac death 03-15-13    hs life vest  . CHF (congestive heart failure)   . Dysrhythmia     WEARS LIFE VEST (DR. C [facility])  . Cervical cancer ~ 2011    cervical    Surgical History:  Past Surgical History  Procedure Laterality Date  . Radiation implants  ~ 2011    "for cervical cancer"  . Colonoscopy  01/30/2012    Procedure: COLONOSCOPY;  Surgeon: Beverley Fiedler, MD;  Location: [facility] ENDOSCOPY;  Service: Gastroenterology;  Laterality: N/A;  . Colonoscopy  01/30/2012    Procedure: COLONOSCOPY;  Surgeon: Beverley Fiedler, MD;  Location: [facility] OR;  Service: Gastroenterology;  Laterality: N/A;  . Colostomy revision  02/04/2012    Procedure: COLON RESECTION SIGMOID;  Surgeon:  Cherylynn Ridges, MD;  Location: [facility] OR;  Service: General;  Laterality: N/A;  . Cholecystectomy  2006  . Laparotomy  08/11/2012    Procedure: EXPLORATORY LAPAROTOMY;  Surgeon: Cherylynn Ridges, MD;  Location: [facility] OR;  Service: General;  Laterality: N/A;  . Laparoscopic lysis of adhesions  08/11/2012    Procedure: LAPAROSCOPIC LYSIS OF ADHESIONS;  Surgeon: Cherylynn Ridges, MD;  Location: [facility] OR;  Service: General;  Laterality: N/A;  . Bowel resection  08/11/2012    Procedure: SMALL BOWEL RESECTION;  Surgeon: Cherylynn Ridges, MD;  Location: [facility] OR;  Service: General;  Laterality: N/A;  . Colostomy  08/11/2012    Procedure: COLOSTOMY;  Surgeon: Cherylynn Ridges, MD;  Location: [facility] OR;  Service: General;  Laterality: N/A;  . Colostomy revision N/A 02/03/2013    Procedure: COLOSTOMY REVISION;  Surgeon: Cherylynn Ridges, MD;  Location: [facility] OR;  Service: General;  Laterality: N/A;  . Colostomy revision N/A 04/16/2013    Procedure: COLOSTOMY REVISION, REPAIR COLOSTOMY FISTULA;  Surgeon: Cherylynn Ridges, MD;  Location: [facility] OR;  Service: General;  Laterality: N/A;  . Laparotomy N/A 04/16/2013    Procedure: EXPLORATORY LAPAROTOMY,Partial Colectomy,Small bowel resection;  Surgeon: Cherylynn Ridges, MD;  Location: [facility] OR;  Service: General;  Laterality: N/A;  . Appendectomy N/A 04/16/2013    Procedure: APPENDECTOMY;  Surgeon: Cherylynn Ridges, MD;  Location: [facility] OR;  Service: General;  Laterality: N/A;    Family History:  Family History  Problem Relation Age of Onset  . Hypertension Mother   . Healthy Father   . Healthy Brother   . Healthy Brother     Social History:  reports that she quit smoking about 14 months ago. Her smoking use included Cigarettes. She has a .25 pack-year smoking history. She has never used smokeless tobacco. She reports that  drinks alcohol. She reports that she uses illicit drugs (Marijuana).  Allergies:  Allergies  Allergen Reactions  . Penicillins Other (See Comments)    "anything w/penicillin in it  makes me bleed"  . Labetalol Hcl Itching and Other (See Comments)    Bumps to bilateral arms.  . Morphine And Related Hives    Medications: I have reviewed the patient's current medications.  ROS: See HPI for pertinent findings, otherwise complete 10 system review negative.  Physical Exam: Blood pressure 97/57, pulse 81, temperature 99.3 F (37.4 C), temperature source Oral, resp. rate 16, height 5\' 4"  (1.626 m), weight 135 lb 12.8 oz (61.598 kg), last menstrual period 04/25/2009, SpO2 99.00%.     Labs: CBC  Recent Labs  04/24/13 0601 04/25/13 0500  WBC 13.2* 16.3*  HGB 8.9* 9.4*  HCT 26.4* 28.5*  PLT 423* 468*   MET  Recent Labs  04/24/13 0601 04/25/13 0500  NA 137 136  K 3.1* 3.1*  CL 98 94*  CO2 28 32  GLUCOSE 116* 89  BUN 19 14  CREATININE 0.52 0.77  CALCIUM 9.6 10.0   No results found for this basename: PROT, ALBUMIN, AST, ALT, ALKPHOS, BILITOT, BILIDIR, IBILI, LIPASE,  in the last 72 hours PT/INR No results found for this basename: LABPROT, INR,  in the last 72 hours ABG No results found for this basename: PHART, PCO2, PO2, HCO3,  in the last 72 hours    Ct Abdomen Pelvis W Contrast  04/25/2013   *RADIOLOGY REPORT*  Clinical Data: Abdominal pain.  Recent colostomy revision and appendectomy on 04/16/2013.  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  Surgical history includes cholecystectomy.  CT ABDOMEN AND PELVIS WITH CONTRAST  Technique:  Multidetector CT imaging of the abdomen and pelvis was performed following the standard protocol during bolus administration of intravenous contrast.  Contrast: OMNIPAQUE IOHEXOL 300 MG/ML  SOLN Oral contrast also administered.  Comparison: CT abdomen and pelvis 02/10/2013, 01/28/2013, 12/03/2012.  Findings: Large abscess in the upper abdomen to the right of midline extending across the midline measuring approximately 7 x 11 x 8 cm.  Oral contrast material is present within the  abscess, indicating an active bowel leak.  I believe the origin of the leak may be a small bowel loop in the upper pelvis, adjacent to the indwelling surgical drain.  There is evidence of a smaller abscess in the left side of the upper pelvis measuring approximately 3 cm diameter, adjacent to this bowel loop, but there is no oral contrast within this abscess. Other smaller abscesses are suspected elsewhere in the abdomen and pelvis.  There is only minimal free fluid in the abdomen.  There is a small amount of free intraperitoneal air.  Gas is also seen tracking upward into the abdominal incision.  The stoma is unremarkable.  Anatomic variant in that the left lobe of the liver extends well across the midline of the left upper quadrant.  No focal hepatic parenchymal abnormality.  Gallbladder surgically absent.  No biliary ductal dilation.  Normal spleen, pancreas, adrenal glands, and right kidney.  Very small (2  mm) calculus in a lower pole calix of the otherwise normal-appearing left kidney.  No visible aorto- iliofemoral atherosclerosis.  Widely patent visceral arteries. Scattered normal-sized retroperitoneal and mesenteric lymph nodes.  Mild thickening of the wall of the urinary bladder diffusely, likely secondarily inflamed from the abdominopelvic inflammatory changes.  Uterus not visualized presumed surgically absent.  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 Phleboliths low in the left side of the pelvis.  Bone window images unremarkable.  Atelectasis in the lung bases. Moderate cardiomegaly with left ventricular enlargement.  IMPRESSION:  1.  Large abscess in the upper abdomen with active oral contrast extravasation from what I believe is a loop of small bowel in the upper pelvis, consistent with perforation. 2.  Smaller abscess in the left side of the pelvis adjacent to this small bowel loop, though there is no contrast extravasation into this abscess.  Other smaller abscesses are  suspected elsewhere in the abdomen and pelvis 3.  Mildly dilated small bowel loops in the upper abdomen, likely a reflex ileus. 4.  Small amount of free intraperitoneal air. 5.  Non-obstructing 2 mm left lower pole renal calculus. 6.  Mild atelectasis in the lower lobes.  Moderate cardiomegaly with left ventricular enlargement.  Critical Value/emergent results were called by telephone at the time of interpretation on 04/25/2013 at 1150 hours to Dr. Luisa Hart of [facility] Surgery, who verbally acknowledged these results.   Original Report Authenticated By: Hulan Saas, M.D.    Assessment/Plan: Intra-abdominal abscess as above with probable small bowel dehiscence.  Pt is not currently a surgical candidate.  - Will place a temporizing drain via CT guidance   Signed,  Sterling Big, MD [facility] Radiology

## 2013-04-25 NOTE — Procedures (Signed)
Interventional Radiology Procedure Note  Procedure: Placement of a 37F drain into peritoneal fluid collection with CT guidance.  20 mL succus aspirated and sent for Cx.  Drain left to gravity bag.  Complications:n  None Recommendations: - Follow Cx - Follow output and lytes, replete as needed   Signed,  Sterling Big, MD Vascular & Interventional Radiologist [facility] Radiology

## 2013-04-25 NOTE — Progress Notes (Signed)
GS Progress Note Subjective: Patient just vomited about 2 liters of bilious fluid.  WBC is elevated.  No fever.  Ostomy is still putting out a large amount of fluid greenish fluid.  Objective: Vital signs in last 24 hours: Temp:  [98.2 F (36.8 C)-98.7 F (37.1 C)] 98.7 F (37.1 C) (08/03 0602) Pulse Rate:  [76-82] 80 (08/03 0602) Resp:  [18-19] 19 (08/03 0602) BP: (104-135)/(66-80) 121/66 mmHg (08/03 0602) SpO2:  [95 %-100 %] 95 % (08/03 0602) Weight:  [61.598 kg (135 lb 12.8 oz)] 61.598 kg (135 lb 12.8 oz) (08/03 0602) Last BM Date: 04/24/13  Intake/Output from previous day: 08/02 0701 - 08/03 0700 In: 840 [P.O.:840] Out: 1065 [Urine:300; Emesis/NG output:250; Drains:15; Stool:500] Intake/Output this shift:    Lungs: Clear  Abd: Soft, excellent bowel sounds.  Moderately tender.  Extremities: No changes  Neuro: Intact  Lab Results: CBC   Recent Labs  04/24/13 0601 04/25/13 0500  WBC 13.2* 16.3*  HGB 8.9* 9.4*  HCT 26.4* 28.5*  PLT 423* 468*   BMET  Recent Labs  04/24/13 0601 04/25/13 0500  NA 137 136  K 3.1* 3.1*  CL 98 94*  CO2 28 32  GLUCOSE 116* 89  BUN 19 14  CREATININE 0.52 0.77  CALCIUM 9.6 10.0   PT/INR No results found for this basename: LABPROT, INR,  in the last 72 hours ABG No results found for this basename: PHART, PCO2, PO2, HCO3,  in the last 72 hours  Studies/Results: No results found.  Anti-infectives: Anti-infectives   Start     Dose/Rate Route Frequency Ordered Stop   04/25/13 0800  levofloxacin (LEVAQUIN) IVPB 500 mg     500 mg 100 mL/hr over 60 Minutes Intravenous Every 24 hours 04/25/13 0655     04/16/13 2200  clindamycin (CLEOCIN) IVPB 900 mg     900 mg 100 mL/hr over 30 Minutes Intravenous 3 times per day 04/16/13 1807 04/16/13 2218   04/16/13 0922  clindamycin (CLEOCIN) 600 MG/50ML IVPB  Status:  Discontinued    Comments:  GALLMAN, KATHIE JEAN: cabinet override      04/16/13 0922 04/16/13 0925   04/16/13 0600   clindamycin (CLEOCIN) IVPB 900 mg     900 mg 100 mL/hr over 30 Minutes Intravenous On call to O.R. 04/15/13 1433 04/16/13 1227   04/16/13 0600  gentamicin (GARAMYCIN) 280.4 mg in dextrose 5 % 100 mL IVPB     5 mg/kg  56.1 kg 107 mL/hr over 60 Minutes Intravenous On call to O.R. 04/15/13 1433 04/16/13 1221      Assessment/Plan: s/p Procedure(s): COLOSTOMY REVISION, REPAIR COLOSTOMY FISTULA EXPLORATORY LAPAROTOMY,Partial Colectomy,Small bowel resection APPENDECTOMY Cut back on diet to NPO with ice chips. Will get CT of abdomen and pelvis with contrast (oral and IV). Started empirically on Levofloxacin.  LOS: 9 days    Marta Lamas. [name], MD, FACS [phone] [facility] Surgery 04/25/2013

## 2013-04-25 NOTE — ED Notes (Signed)
Denies pain at this time.

## 2013-04-25 NOTE — Progress Notes (Signed)
Intraabdominal abscess with oral contrast on CT noted.  D/W radiologist.  Will make NPO and ask for drain per IR.  Dr [name] aware.

## 2013-04-25 NOTE — ED Notes (Signed)
[employer] here.  Awake and alert.  Tolerated procedure well.  VSS.

## 2013-04-25 NOTE — ED Notes (Signed)
O2 2L/[place] started

## 2013-04-26 LAB — CBC WITH DIFFERENTIAL/PLATELET
Basophils Relative: 1 % (ref 0–1)
Eosinophils Absolute: 0.1 10*3/uL (ref 0.0–0.7)
Eosinophils Relative: 1 % (ref 0–5)
HCT: 25.7 % — ABNORMAL LOW (ref 36.0–46.0)
Hemoglobin: 8.5 g/dL — ABNORMAL LOW (ref 12.0–15.0)
Lymphocytes Relative: 15 % (ref 12–46)
MCHC: 33.1 g/dL (ref 30.0–36.0)
Monocytes Relative: 10 % (ref 3–12)
Neutro Abs: 10.5 10*3/uL — ABNORMAL HIGH (ref 1.7–7.7)

## 2013-04-26 LAB — BASIC METABOLIC PANEL
BUN: 9 mg/dL (ref 6–23)
CO2: 30 mEq/L (ref 19–32)
Chloride: 97 mEq/L (ref 96–112)
Glucose, Bld: 82 mg/dL (ref 70–99)
Potassium: 3.6 mEq/L (ref 3.5–5.1)

## 2013-04-26 MED ORDER — SODIUM CHLORIDE 0.9 % IV SOLN
INTRAVENOUS | Status: DC
  Administered 2013-04-27: 20 mL/h via INTRAVENOUS
  Administered 2013-05-01 – 2013-05-05 (×2): via INTRAVENOUS

## 2013-04-26 MED ORDER — FAT EMULSION 20 % IV EMUL
250.0000 mL | INTRAVENOUS | Status: AC
  Administered 2013-04-26: 250 mL via INTRAVENOUS
  Filled 2013-04-26: qty 250

## 2013-04-26 MED ORDER — TRACE MINERALS CR-CU-F-FE-I-MN-MO-SE-ZN IV SOLN
INTRAVENOUS | Status: AC
  Administered 2013-04-26: 18:00:00 via INTRAVENOUS
  Filled 2013-04-26: qty 2000

## 2013-04-26 NOTE — Progress Notes (Signed)
PARENTERAL NUTRITION CONSULT NOTE - restart 8/ Pharmacy Consult for TPN Indication: chronic malnutrition, on home TPN  Allergies  Allergen Reactions  . Penicillins Other (See Comments)    "anything w/penicillin in it makes me bleed"  . Labetalol Hcl Itching and Other (See Comments)    Bumps to bilateral arms.  . Morphine And Related Hives    Patient Measurements: Height: 5\' 4"  (162.6 cm) Weight: 137 lb 11.1 oz (62.458 kg) IBW/kg (Calculated) : 54.7   Vital Signs: Temp: 98.4 F (36.9 C) (08/04 0535) BP: 104/63 mmHg (08/04 0804) Pulse Rate: 76 (08/04 0804) Intake/Output from previous day: 08/03 0701 - 08/04 0700 In: 973.8 [I.V.:963.8] Out: 5230 [Urine:850; Emesis/NG output:2000; Drains:455; Stool:1925] Intake/Output from this shift: Total I/O In: 0  Out: 200 [Stool:200]  Labs:  Recent Labs  04/24/13 0601 04/25/13 0500 04/26/13 0450  WBC 13.2* 16.3* 14.2*  HGB 8.9* 9.4* 8.5*  HCT 26.4* 28.5* 25.7*  PLT 423* 468* 412*     Recent Labs  04/24/13 0601 04/25/13 0500 04/26/13 0450  NA 137 136 135  K 3.1* 3.1* 3.6  CL 98 94* 97  CO2 28 32 30  GLUCOSE 116* 89 82  BUN 19 14 9   CREATININE 0.52 0.77 0.72  CALCIUM 9.6 10.0 9.6   Estimated Creatinine Clearance: 83.1 ml/min (by C-G formula based on Cr of 0.72).   No results found for this basename: GLUCAP,  in the last 72 hours  Current Nutrition:  Cyclic TPN 1920 mls over 14 hours + lipids 17.8 ml/hr over 14 hours provides 1862 kcals and 96 gms of protein (meets 100% estimated needs) + reg diet  Nutritional Goals:  1700-2000 kCal, 80-95 grams of protein per day per RD recommendations 7/30  Assessment:  Patient known to pharmacy from past TPN consults. She has a prolapsed ostomy which is s/p repair she is now s/p colostomy revision, repair colostomy fistula, exp lap, Partial colectomy. Small bowel resection, appendectomy. Patient has extensive GI history.  She was on TPN per [facility] at home previously, but per [facility]  she has been off TPN now for about 1 month. TPN resumed 7/28 PM for ileus. TNA was stopped 8/2 am b/c she was tolerating regular diet. 8/3 intraabdominal abscess w/ probable small bowel dehiscence. Probably a breakdown of her anastomosis in the RLQ. New EC fistula 8/3 placement of drain into peritoneal fluid collection. 8/4 to resume TPN, NPO until output goes down  GI: s/p COLOSTOMY REVISION, REPAIR COLOSTOMY FISTULA (N/A)  EXPLORATORY LAPAROTOMY,Partial Colectomy,Small bowel resection (N/A)  APPENDECTOMY. has colostomy LUQ.  NGT out 7/30 PM.  . Baseline prealbumin 11.2 on 04/19/13 - could reflect post-op stat.  8/3 CT abdomen: large abscess in upper abdomen perforation, smaller abscess in L side of pelvis, ileus,  8/4 good bowel sounds. Perc drains w/ 450/last shift. Colostomy working well, "probably a breakdown of her anastomosis in the RLQ, new EC fistula" , well need NPO and TPN until output goes down  Endo: no hx, has not required insulin in her TPN in the past, serum glucose acceptable at 82 Lytes: getting extra k supplement daily k up to 3.6 from 3.1 after 3 runs of k given yesterday in addition to home kdur 20 po tid.   Home dose of kdur 20 tid + lasix 40 po daily restarted 7/31. Calcium corrects to 10.72. (Ca x phos < 55).  Noted patient has accumulated lytes in the past and has required lyte free formula   Renal: SCr stable, CrCl ~47mL/min  Cards: cardiomyopathy (EF 5/10 20%, BNP 1234 5/28). On bidil, lisinipril, spironolactone, coreg, lasix, kdur; has LifeVest.   Hepatobil: LFTs wnl, admission albumin 3.3. Doppler 7/29 negative for DVT.  ID:  levaquin per pharmacy for abnormal UA, WBC down to 14.2 from 16.3. T max 99.3.  levaquin 500 mg IV q24 for creat cl ~80 for UTI 8/3 UC IP 8/3 abscess cx pending levaquin 8/3 >>  Best Practices: warfarin (DVT 5/22)  Dr. [name] does NOT plan to resume warf 2nd no dVT 7/29 doppler. Hg 8.5. Got 1 unit PRBC 7/30.  on sq heparin for VTE px  TPN  Access: PICC   TPN day#:  (started 04/19/13 then stopped 8/2 am and restarted 8/4 PM)   Plan:  1. restart Clinimix E 5/15 with a 14 hr cycle at 52mL/hr x1 hour, then 171mL/hr over 12 hours, then 26mL/hr x1 hour for a total of over 14 hours  2. Restart cyclic lipids at 17.8 ml/hr over 14 hours 3. Decrease NS at 75 ml/hr to [facility] tonight at 1800 when TNA resumed 4. Order TNA standing labs  -Herby Abraham, Pharm.D. [phone] 04/26/2013 10:06 AM

## 2013-04-26 NOTE — Progress Notes (Signed)
Output from patient's colostomy is green this am- similar to drain output.  Also patient has watery brown discharge on bed pad this am,coming from rectum.

## 2013-04-26 NOTE — Progress Notes (Signed)
GS Progress Note Subjective: Patient feels good and wants to eat.  No more nausea or vomiting.  Much less pain.  Objective: Vital signs in last 24 hours: Temp:  [98.4 F (36.9 C)-99.3 F (37.4 C)] 98.4 F (36.9 C) (08/04 0535) Pulse Rate:  [71-81] 76 (08/04 0804) Resp:  [12-21] 17 (08/04 0535) BP: (96-111)/(53-73) 104/63 mmHg (08/04 0804) SpO2:  [93 %-100 %] 93 % (08/04 0535) Weight:  [62.458 kg (137 lb 11.1 oz)] 62.458 kg (137 lb 11.1 oz) (08/04 0535) Last BM Date: 04/25/13  Intake/Output from previous day: 08/03 0701 - 08/04 0700 In: 973.8 [I.V.:963.8] Out: 5230 [Urine:850; Emesis/NG output:2000; Drains:455; Stool:1925] Intake/Output this shift:    Lungs: Clear  Abd: Good bowel sounds.  Percutaneous drain with total of 450cc last shift, 150 cc recently of bilious output.  ? If gaseous output also.  Colostomy is working well, 200+ cc output and starting to thicken.  Extremities: No changes  Neuro: Intact  Lab Results: CBC   Recent Labs  04/25/13 0500 04/26/13 0450  WBC 16.3* 14.2*  HGB 9.4* 8.5*  HCT 28.5* 25.7*  PLT 468* 412*   BMET  Recent Labs  04/25/13 0500 04/26/13 0450  NA 136 135  K 3.1* 3.6  CL 94* 97  CO2 32 30  GLUCOSE 89 82  BUN 14 9  CREATININE 0.77 0.72  CALCIUM 10.0 9.6   PT/INR No results found for this basename: LABPROT, INR,  in the last 72 hours ABG No results found for this basename: PHART, PCO2, PO2, HCO3,  in the last 72 hours  Studies/Results: Ct Abdomen Pelvis W Contrast  04/25/2013   *RADIOLOGY REPORT*  Clinical Data: Abdominal pain.  Recent colostomy revision and appendectomy on 04/16/2013.  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  Surgical history includes cholecystectomy.  CT ABDOMEN AND PELVIS WITH CONTRAST  Technique:  Multidetector CT imaging of the abdomen and pelvis was performed following the standard protocol during bolus administration of intravenous contrast.  Contrast:  OMNIPAQUE IOHEXOL 300 MG/ML  SOLN Oral contrast also administered.  Comparison: CT abdomen and pelvis 02/10/2013, 01/28/2013, 12/03/2012.  Findings: Large abscess in the upper abdomen to the right of midline extending across the midline measuring approximately 7 x 11 x 8 cm.  Oral contrast material is present within the abscess, indicating an active bowel leak.  I believe the origin of the leak may be a small bowel loop in the upper pelvis, adjacent to the indwelling surgical drain.  There is evidence of a smaller abscess in the left side of the upper pelvis measuring approximately 3 cm diameter, adjacent to this bowel loop, but there is no oral contrast within this abscess. Other smaller abscesses are suspected elsewhere in the abdomen and pelvis.  There is only minimal free fluid in the abdomen.  There is a small amount of free intraperitoneal air.  Gas is also seen tracking upward into the abdominal incision.  The stoma is unremarkable.  Anatomic variant in that the left lobe of the liver extends well across the midline of the left upper quadrant.  No focal hepatic parenchymal abnormality.  Gallbladder surgically absent.  No biliary ductal dilation.  Normal spleen, pancreas, adrenal glands, and right kidney.  Very small (2 mm) calculus in a lower pole calix of the otherwise normal-appearing left kidney.  No visible aorto- iliofemoral atherosclerosis.  Widely patent visceral arteries. Scattered normal-sized retroperitoneal and mesenteric lymph nodes.  Mild thickening of the wall of the urinary  bladder diffusely, likely secondarily inflamed from the abdominopelvic inflammatory changes.  Uterus not visualized presumed surgically absent.  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 Phleboliths low in the left side of the pelvis.  Bone window images unremarkable.  Atelectasis in the lung bases. Moderate cardiomegaly with left ventricular enlargement.  IMPRESSION:  1.  Large abscess in the upper  abdomen with active oral contrast extravasation from what I believe is a loop of small bowel in the upper pelvis, consistent with perforation. 2.  Smaller abscess in the left side of the pelvis adjacent to this small bowel loop, though there is no contrast extravasation into this abscess.  Other smaller abscesses are suspected elsewhere in the abdomen and pelvis 3.  Mildly dilated small bowel loops in the upper abdomen, likely a reflex ileus. 4.  Small amount of free intraperitoneal air. 5.  Non-obstructing 2 mm left lower pole renal calculus. 6.  Mild atelectasis in the lower lobes.  Moderate cardiomegaly with left ventricular enlargement.  Critical Value/emergent results were called by telephone at the time of interpretation on 04/25/2013 at 1150 hours to Dr. Luisa Hart of [facility], who verbally acknowledged these results.   Original Report Authenticated By: Hulan Saas, M.D.   Ct Image Guided Drainage Percut Cath  Peritoneal Retroperit  04/26/2013   *RADIOLOGY REPORT*  CT GUIDED ABSCESS DRAIN PLACEMENT  Date: 04/25/2013  Clinical History: 37 year old female with complicated medical and surgical history.  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  She is also experienced multiple post surgical set backs.  Most recent operation was 04/16/2013 when she underwent small bowel resection and colonic resection to repair colocutaneous and enterocutaneous fistulae as well as revision of a prolapsed ostomy.  She now presents with a large intra-abdominal abscess and extravasation of oral contrast material concerning for small bowel anastomotic leak. She is currently not a surgical candidate.  A percutaneous drainage catheter is requested.  Procedures Performed: 1. CT guided abscess drain placement  Interventional Radiologist:  Sterling Big, MD  Sedation: Moderate (conscious) sedation was used.  Two mg Versed, 75 mcg Fentanyl were administered  intravenously.  The patient's vital signs were monitored continuously by radiology nursing throughout the procedure.  Sedation Time: 15 minutes  PROCEDURE/FINDINGS:   Informed consent was obtained from the patient following explanation of the procedure, risks, benefits and alternatives. The patient understands, agrees and consents for the procedure. All questions were addressed. A time out was performed.  Maximal barrier sterile technique utilized including caps, mask, sterile gowns, sterile gloves, large sterile drape, hand hygiene, and betadine skin prep.  A planning axial CT scan was performed.  The right lower quadrant fluid and contrast collection was successfully identified. A suitable skin entry site was selected and marked.  Local anesthesia was attained by infiltration of 1% lidocaine.  Using step in shoot CT guidance, and 18 gauge trocar needle was carefully advanced into the fluid collection.  Of 03/05 wire was coiled within the fluid collection.  The skin tract was serially dilated to 12-French and a 12-French Cook all-purpose drainage catheter was advanced over the wire and formed within the abscess collection.  A sample of fluid resembling enteric.  This was aspirated and sent for culture and analysis.  The tube was secured to the skin with O Prolene suture and a sterile bandage was applied.  The tube was left to gravity drainage. There is no immediate complication.  The  patient tolerated the procedure well.  IMPRESSION:  Placement of a 12-French percutaneous drain into the right lower quadrant abdominal abscess/fluid collection.  Aspirated material has an appearance suggestive of enteric sulcus.  A sample was sent for culture.  The catheter was left to gravity drainage.  Bulb suction was deferred given the high possibility of fistula formation.  Signed,  Sterling Big, MD Vascular & Interventional Radiologist [facility]   Original Report Authenticated By: Malachy Moan, M.D.     Anti-infectives: Anti-infectives   Start     Dose/Rate Route Frequency Ordered Stop   04/25/13 0800  levofloxacin (LEVAQUIN) IVPB 500 mg     500 mg 100 mL/hr over 60 Minutes Intravenous Every 24 hours 04/25/13 0655     04/16/13 2200  clindamycin (CLEOCIN) IVPB 900 mg     900 mg 100 mL/hr over 30 Minutes Intravenous 3 times per day 04/16/13 1807 04/16/13 2218   04/16/13 0922  clindamycin (CLEOCIN) 600 MG/50ML IVPB  Status:  Discontinued    Comments:  GALLMAN, KATHIE JEAN: cabinet override      04/16/13 0922 04/16/13 0925   04/16/13 0600  clindamycin (CLEOCIN) IVPB 900 mg     900 mg 100 mL/hr over 30 Minutes Intravenous On call to O.R. 04/15/13 1433 04/16/13 1227   04/16/13 0600  gentamicin (GARAMYCIN) 280.4 mg in dextrose 5 % 100 mL IVPB     5 mg/kg  56.1 kg 107 mL/hr over 60 Minutes Intravenous On call to O.R. 04/15/13 1433 04/16/13 1221      Assessment/Plan: s/p Procedure(s): COLOSTOMY REVISION, REPAIR COLOSTOMY FISTULA EXPLORATORY LAPAROTOMY,Partial Colectomy,Small bowel resection APPENDECTOMY Probably a breakdown of her anastomosis in the RLQ.  New EC fistula. Will need NPO status and TPN until output goes down.  LOS: 10 days    Marta Lamas. [name], MD, FACS [phone] [facility] 04/26/2013

## 2013-04-27 LAB — COMPREHENSIVE METABOLIC PANEL
ALT: 17 U/L (ref 0–35)
Alkaline Phosphatase: 108 U/L (ref 39–117)
CO2: 28 mEq/L (ref 19–32)
GFR calc Af Amer: >90 mL/min (ref >90)
GFR calc non Af Amer: >90 mL/min (ref >90)
Glucose, Bld: 116 mg/dL — ABNORMAL HIGH (ref 70–99)
Potassium: 3.8 mEq/L (ref 3.5–5.1)
Sodium: 134 mEq/L — ABNORMAL LOW (ref 135–145)

## 2013-04-27 LAB — URINE CULTURE: Special Requests: NORMAL

## 2013-04-27 LAB — TRIGLYCERIDES: Triglycerides: 152 mg/dL — ABNORMAL HIGH (ref <150)

## 2013-04-27 MED ORDER — FAT EMULSION 20 % IV EMUL
250.0000 mL | INTRAVENOUS | Status: AC
  Administered 2013-04-27: 250 mL via INTRAVENOUS
  Filled 2013-04-27: qty 250

## 2013-04-27 MED ORDER — TRACE MINERALS CR-CU-F-FE-I-MN-MO-SE-ZN IV SOLN
INTRAVENOUS | Status: AC
  Administered 2013-04-27: 18:00:00 via INTRAVENOUS
  Filled 2013-04-27: qty 2000

## 2013-04-27 MED ORDER — MAGNESIUM SULFATE 40 MG/ML IJ SOLN
2.0000 g | Freq: Once | INTRAMUSCULAR | Status: AC
  Administered 2013-04-27: 2 g via INTRAVENOUS
  Filled 2013-04-27: qty 50

## 2013-04-27 NOTE — Progress Notes (Signed)
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   Underwent the following procedures on 7/25:  COLOSTOMY REVISION  REPAIR COLOSTOMY FISTULA  EXPLORATORY LAPAROTOMY  PARTIAL COLECTOMY  APPENDECTOMY   Patient s/p CT guided abscess drain placement 8/3.  Minimal drain output.  No nausea or vomiting.  Less pain.  Patient is receiving cyclic TPN with Clinimix E 5/15 over 14 hours: 58mL/hr x1 hour, then 123mL/hr over 12 hours, then 60mL/hr x1 hour. Lipids (20% IVFE @ 17.8 ml/hr), multivitamins, and trace elements over 14 hours. Provides 1861 total kcal and 96 grams protein daily. Meets 100% estimated kcal and protein needs.  Height: Ht Readings from Last 1 Encounters:  04/18/13 5\' 4"  (1.626 m)    Weight Status:   Wt Readings from Last 1 Encounters:  04/27/13 131 lb 4.8 oz (59.557 kg)    Re-estimated needs:  Kcal: 1700-2000 Protein: 80-95 gm Fluid: per MD  Skin: LLQ colostomy  Diet Order: Clear Liquid   Intake/Output Summary (Last 24 hours) at 04/27/13 0953 Last data filed at 04/27/13 0542  Gross per 24 hour  Intake     18 ml  Output   2140 ml  Net  -2122 ml    Labs:   Recent Labs Lab 04/25/13 0500 04/26/13 0450 04/27/13 0500  NA 136 135 134*  K 3.1* 3.6 3.8  CL 94* 97 98  CO2 32 30 28  BUN 14 9 10   CREATININE 0.77 0.72 0.69  CALCIUM 10.0 9.6 10.1  MG  --   --  1.6  PHOS  --   --  2.8  GLUCOSE 89 82 116*    Scheduled Meds: . carvedilol  18.75 mg Oral BID WC  . furosemide  40 mg Oral Daily  . heparin subcutaneous  5,000 Units  Subcutaneous Q8H  . isosorbide-hydrALAZINE  1 tablet Oral BID  . levofloxacin (LEVAQUIN) IV  500 mg Intravenous Q24H  . lisinopril  40 mg Oral Daily  . magnesium sulfate 1 - 4 g bolus IVPB  2 g Intravenous Once  . ondansetron (ZOFRAN) IV  4 mg Intravenous Q6H  . potassium chloride  20 mEq Oral TID  . sodium chloride  10-40 mL Intracatheter Q12H  . spironolactone  12.5 mg Oral Daily    Continuous Infusions: . Marland KitchenTPN (CLINIMIX-E) Adult     And  . fat emulsion    . sodium chloride      Maureen Chatters, RD, LDN Pager #: [phone] After-Hours Pager #: [phone]

## 2013-04-27 NOTE — Progress Notes (Signed)
GS Progress Note Subjective: Patient is doing well.  Very little drainage from percutaneous drain.  Still some concern about gas coming out of the drain.  Objective: Vital signs in last 24 hours: Temp:  [98.3 F (36.8 C)-98.4 F (36.9 C)] 98.4 F (36.9 C) (08/05 0541) Pulse Rate:  [70-76] 70 (08/05 0541) Resp:  [18] 18 (08/05 0541) BP: (91-102)/(53-66) 101/57 mmHg (08/05 0541) SpO2:  [99 %] 99 % (08/05 0541) Weight:  [59.557 kg (131 lb 4.8 oz)] 59.557 kg (131 lb 4.8 oz) (08/05 0500) Last BM Date: 04/26/13  Intake/Output from previous day: 08/04 0701 - 08/05 0700 In: 18 [I.V.:18] Out: 2140 [Urine:1700; Drains:40; Stool:400] Intake/Output this shift:    Lungs: Clear to auscultation  Abd: Benign.  Minimal drain output.  Gas in bag.  Extremities: No clinical signs of DVT  Neuro: Intact  Lab Results: CBC   Recent Labs  04/25/13 0500 04/26/13 0450  WBC 16.3* 14.2*  HGB 9.4* 8.5*  HCT 28.5* 25.7*  PLT 468* 412*   BMET  Recent Labs  04/26/13 0450 04/27/13 0500  NA 135 134*  K 3.6 3.8  CL 97 98  CO2 30 28  GLUCOSE 82 116*  BUN 9 10  CREATININE 0.72 0.69  CALCIUM 9.6 10.1   PT/INR No results found for this basename: LABPROT, INR,  in the last 72 hours ABG No results found for this basename: PHART, PCO2, PO2, HCO3,  in the last 72 hours  Studies/Results: Ct Abdomen Pelvis W Contrast  04/25/2013   *RADIOLOGY REPORT*  Clinical Data: Abdominal pain.  Recent colostomy revision and appendectomy on 04/16/2013.  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  Surgical history includes cholecystectomy.  CT ABDOMEN AND PELVIS WITH CONTRAST  Technique:  Multidetector CT imaging of the abdomen and pelvis was performed following the standard protocol during bolus administration of intravenous contrast.  Contrast: OMNIPAQUE IOHEXOL 300 MG/ML  SOLN Oral contrast also administered.  Comparison: CT abdomen and pelvis 02/10/2013,  01/28/2013, 12/03/2012.  Findings: Large abscess in the upper abdomen to the right of midline extending across the midline measuring approximately 7 x 11 x 8 cm.  Oral contrast material is present within the abscess, indicating an active bowel leak.  I believe the origin of the leak may be a small bowel loop in the upper pelvis, adjacent to the indwelling surgical drain.  There is evidence of a smaller abscess in the left side of the upper pelvis measuring approximately 3 cm diameter, adjacent to this bowel loop, but there is no oral contrast within this abscess. Other smaller abscesses are suspected elsewhere in the abdomen and pelvis.  There is only minimal free fluid in the abdomen.  There is a small amount of free intraperitoneal air.  Gas is also seen tracking upward into the abdominal incision.  The stoma is unremarkable.  Anatomic variant in that the left lobe of the liver extends well across the midline of the left upper quadrant.  No focal hepatic parenchymal abnormality.  Gallbladder surgically absent.  No biliary ductal dilation.  Normal spleen, pancreas, adrenal glands, and right kidney.  Very small (2 mm) calculus in a lower pole calix of the otherwise normal-appearing left kidney.  No visible aorto- iliofemoral atherosclerosis.  Widely patent visceral arteries. Scattered normal-sized retroperitoneal and mesenteric lymph nodes.  Mild thickening of the wall of the urinary bladder diffusely, likely secondarily inflamed from the abdominopelvic inflammatory changes.  Uterus not visualized presumed surgically absent.  Post radiation  fibrosis in the presacral space of the pelvis. Phleboliths low in the left side of the pelvis.  Bone window images unremarkable.  Atelectasis in the lung bases. Moderate cardiomegaly with left ventricular enlargement.  IMPRESSION:  1.  Large abscess in the upper abdomen with active oral contrast extravasation from what I believe is a loop of small bowel in the upper pelvis,  consistent with perforation. 2.  Smaller abscess in the left side of the pelvis adjacent to this small bowel loop, though there is no contrast extravasation into this abscess.  Other smaller abscesses are suspected elsewhere in the abdomen and pelvis 3.  Mildly dilated small bowel loops in the upper abdomen, likely a reflex ileus. 4.  Small amount of free intraperitoneal air. 5.  Non-obstructing 2 mm left lower pole renal calculus. 6.  Mild atelectasis in the lower lobes.  Moderate cardiomegaly with left ventricular enlargement.  Critical Value/emergent results were called by telephone at the time of interpretation on 04/25/2013 at 1150 hours to Dr. Luisa Hart of [facility], who verbally acknowledged these results.   Original Report Authenticated By: Hulan Saas, M.D.   Ct Image Guided Drainage Percut Cath  Peritoneal Retroperit  04/26/2013   *RADIOLOGY REPORT*  CT GUIDED ABSCESS DRAIN PLACEMENT  Date: 04/25/2013  Clinical History: 37 year old female with complicated medical and surgical history.  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  She is also experienced multiple post surgical set backs.  Most recent operation was 04/16/2013 when she underwent small bowel resection and colonic resection to repair colocutaneous and enterocutaneous fistulae as well as revision of a prolapsed ostomy.  She now presents with a large intra-abdominal abscess and extravasation of oral contrast material concerning for small bowel anastomotic leak. She is currently not a surgical candidate.  A percutaneous drainage catheter is requested.  Procedures Performed: 1. CT guided abscess drain placement  Interventional Radiologist:  Sterling Big, MD  Sedation: Moderate (conscious) sedation was used.  Two mg Versed, 75 mcg Fentanyl were administered intravenously.  The patient's vital signs were monitored continuously by radiology nursing throughout the  procedure.  Sedation Time: 15 minutes  PROCEDURE/FINDINGS:   Informed consent was obtained from the patient following explanation of the procedure, risks, benefits and alternatives. The patient understands, agrees and consents for the procedure. All questions were addressed. A time out was performed.  Maximal barrier sterile technique utilized including caps, mask, sterile gowns, sterile gloves, large sterile drape, hand hygiene, and betadine skin prep.  A planning axial CT scan was performed.  The right lower quadrant fluid and contrast collection was successfully identified. A suitable skin entry site was selected and marked.  Local anesthesia was attained by infiltration of 1% lidocaine.  Using step in shoot CT guidance, and 18 gauge trocar needle was carefully advanced into the fluid collection.  Of 03/05 wire was coiled within the fluid collection.  The skin tract was serially dilated to 12-French and a 12-French Cook all-purpose drainage catheter was advanced over the wire and formed within the abscess collection.  A sample of fluid resembling enteric.  This was aspirated and sent for culture and analysis.  The tube was secured to the skin with O Prolene suture and a sterile bandage was applied.  The tube was left to gravity drainage. There is no immediate complication.  The patient tolerated the procedure well.  IMPRESSION:  Placement of a 12-French percutaneous drain into the right lower quadrant abdominal  abscess/fluid collection.  Aspirated material has an appearance suggestive of enteric sulcus.  A sample was sent for culture.  The catheter was left to gravity drainage.  Bulb suction was deferred given the high possibility of fistula formation.  Signed,  Sterling Big, MD Vascular & Interventional Radiologist [facility] Radiology   Original Report Authenticated By: Malachy Moan, M.D.    Anti-infectives: Anti-infectives   Start     Dose/Rate Route Frequency Ordered Stop   04/25/13 0800   levofloxacin (LEVAQUIN) IVPB 500 mg     500 mg 100 mL/hr over 60 Minutes Intravenous Every 24 hours 04/25/13 0655     04/16/13 2200  clindamycin (CLEOCIN) IVPB 900 mg     900 mg 100 mL/hr over 30 Minutes Intravenous 3 times per day 04/16/13 1807 04/16/13 2218   04/16/13 0922  clindamycin (CLEOCIN) 600 MG/50ML IVPB  Status:  Discontinued    Comments:  GALLMAN, KATHIE JEAN: cabinet override      04/16/13 0922 04/16/13 0925   04/16/13 0600  clindamycin (CLEOCIN) IVPB 900 mg     900 mg 100 mL/hr over 30 Minutes Intravenous On call to O.R. 04/15/13 1433 04/16/13 1227   04/16/13 0600  gentamicin (GARAMYCIN) 280.4 mg in dextrose 5 % 100 mL IVPB     5 mg/kg  56.1 kg 107 mL/hr over 60 Minutes Intravenous On call to O.R. 04/15/13 1433 04/16/13 1221      Assessment/Plan: s/p Procedure(s): COLOSTOMY REVISION, REPAIR COLOSTOMY FISTULA EXPLORATORY LAPAROTOMY,Partial Colectomy,Small bowel resection APPENDECTOMY Advance diet Clear liquids only.  If output goes up will make strictly NPO and get fistulogram in the future.   LOS: 11 days    Marta Lamas. [name], MD, FACS [phone] [facility] 04/27/2013

## 2013-04-27 NOTE — Progress Notes (Addendum)
PARENTERAL NUTRITION CONSULT NOTE - restart 8/ Pharmacy Consult for TPN Indication: chronic malnutrition, on home TPN  Allergies  Allergen Reactions  . Penicillins Other (See Comments)    "anything w/penicillin in it makes me bleed"  . Labetalol Hcl Itching and Other (See Comments)    Bumps to bilateral arms.  . Morphine And Related Hives    Patient Measurements: Height: 5\' 4"  (162.6 cm) Weight: 131 lb 4.8 oz (59.557 kg) IBW/kg (Calculated) : 54.7   Vital Signs: Temp: 98.4 F (36.9 C) (08/05 0541) Temp src: Oral (08/05 0541) BP: 101/57 mmHg (08/05 0541) Pulse Rate: 70 (08/05 0541) Intake/Output from previous day: 08/04 0701 - 08/05 0700 In: 18 [I.V.:18] Out: 2140 [Urine:1700; Drains:40; Stool:400] Intake/Output from this shift:    Labs:  Recent Labs  04/25/13 0500 04/26/13 0450  WBC 16.3* 14.2*  HGB 9.4* 8.5*  HCT 28.5* 25.7*  PLT 468* 412*     Recent Labs  04/25/13 0500 04/26/13 0450 04/26/13 1143 04/26/13 1145 04/27/13 0500  NA 136 135  --   --  134*  K 3.1* 3.6  --   --  3.8  CL 94* 97  --   --  98  CO2 32 30  --   --  28  GLUCOSE 89 82  --   --  116*  BUN 14 9  --   --  10  CREATININE 0.77 0.72  --   --  0.69  CALCIUM 10.0 9.6  --   --  10.1  MG  --   --   --   --  1.6  PHOS  --   --   --   --  2.8  PROT  --   --   --   --  6.5  ALBUMIN  --   --   --   --  2.2*  AST  --   --   --   --  22  ALT  --   --   --   --  17  ALKPHOS  --   --   --   --  108  BILITOT  --   --   --   --  0.2*  PREALBUMIN  --   --   --  9.7*  --   TRIG  --   --  176*  --  152*   Estimated Creatinine Clearance: 83.1 ml/min (by C-G formula based on Cr of 0.69).   No results found for this basename: GLUCAP,  in the last 72 hours  Current Nutrition:  Cyclic TPN 1920 mls over 14 hours + lipids 17.8 ml/hr over 14 hours provides 1862 kcals and 96 gms of protein (meets 100% estimated needs) + reg diet  Nutritional Goals:  1700-2000 kCal, 80-95 grams of protein per day per  RD recommendations 7/30  Assessment:  Patient known to pharmacy from past TPN consults. She has a prolapsed ostomy which is s/p repair she is now s/p colostomy revision, repair colostomy fistula, exp lap, Partial colectomy. Small bowel resection, appendectomy. Patient has extensive GI history.  She was on TPN per [facility] at home previously, but per [facility] she has been off TPN now for about 1 month. TPN resumed 7/28 PM for ileus. TNA was stopped 8/2 am b/c she was tolerating regular diet. 8/3 intraabdominal abscess w/ probable small bowel dehiscence. Probably a breakdown of her anastomosis in the RLQ. New EC fistula 8/3 placement of drain into peritoneal fluid collection. 8/4  to resume TPN, NPO until output goes down  GI: s/p COLOSTOMY REVISION, REPAIR COLOSTOMY FISTULA (N/A)  EXPLORATORY LAPAROTOMY,Partial Colectomy,Small bowel resection (N/A)  APPENDECTOMY. has colostomy LUQ.  NGT out 7/30 PM.  . Baseline prealbumin 11.2 on 04/19/13 - could reflect post-op stat.  8/3 CT abdomen: large abscess in upper abdomen perforation, smaller abscess in L side of pelvis, ileus,  8/4 good bowel sounds. Perc drains w/ 450/last shift. Colostomy working well, "probably a breakdown of her anastomosis in the RLQ, new EC fistula" , well need NPO and TPN until output goes down  Endo: no hx, has not required insulin in her TPN in the past, serum glucose acceptable at 116  Lytes: K improved at 3.8 (goal >4), on home kdur 20 tid + lasix 40 po daily. Mag low NL at 1.6 (goal>2).  Calcium corrects to 11.5. (Ca x phos < 55).  Noted patient has accumulated lytes in the past and has required lyte free formula   Renal: SCr stable, CrCl ~24mL/min  Cards: cardiomyopathy (EF 5/10 20%, BNP 1234 5/28). On bidil, lisinipril, spironolactone, coreg, lasix, kdur; has LifeVest.   Hepatobil: LFTs wnl, admission albumin 3.3. Doppler 7/29 negative for DVT.  ID:  levaquin per pharmacy for abnormal UA, WBC down to 14.2 from 16.3. T max 99.3.   levaquin 500 mg IV q24 for creat cl ~80 for UTI 8/3 UC IP 8/3 abscess cx pending levaquin 8/3 >>  Best Practices: warfarin (DVT 5/22)  Dr. [name] does NOT plan to resume warf 2nd no dVT 7/29 doppler. Hg 8.5. Got 1 unit PRBC 7/30.  on sq heparin for VTE px  TPN Access: PICC   TPN day#:  (started 04/19/13 then stopped 8/2 am and restarted 8/4 PM)   Plan:  - Continue Clinimix E 5/15 with a 14 hr cycle at 61mL/hr x1 hour, then 12mL/hr over 12 hours, then 45mL/hr x1 hour for a total of over 14 hours  - Continue cyclic lipids at 17.8 ml/hr over 14 hours - Replete Mag with 2g MagSulfate - Continue NS at [facility] - F/u TNA standing labs  Vanessa [name] L. Illene Bolus, PharmD, BCPS Clinical Pharmacist Pager: [phone] Pharmacy: [phone] 04/27/2013 7:58 AM

## 2013-04-28 DIAGNOSIS — K632 Fistula of intestine: Secondary | ICD-10-CM

## 2013-04-28 DIAGNOSIS — E46 Unspecified protein-calorie malnutrition: Secondary | ICD-10-CM

## 2013-04-28 LAB — CBC WITH DIFFERENTIAL/PLATELET
Basophils Absolute: 0 10*3/uL (ref 0.0–0.1)
Eosinophils Relative: 3 % (ref 0–5)
HCT: 26.1 % — ABNORMAL LOW (ref 36.0–46.0)
Hemoglobin: 8.5 g/dL — ABNORMAL LOW (ref 12.0–15.0)
Lymphocytes Relative: 19 % (ref 12–46)
MCV: 82.1 fL (ref 78.0–100.0)
Monocytes Absolute: 1.3 10*3/uL — ABNORMAL HIGH (ref 0.1–1.0)
Monocytes Relative: 13 % — ABNORMAL HIGH (ref 3–12)
RDW: 17.9 % — ABNORMAL HIGH (ref 11.5–15.5)
WBC: 9.8 10*3/uL (ref 4.0–10.5)

## 2013-04-28 LAB — CULTURE, ROUTINE-ABSCESS

## 2013-04-28 LAB — BASIC METABOLIC PANEL
BUN: 13 mg/dL (ref 6–23)
CO2: 26 mEq/L (ref 19–32)
Chloride: 99 mEq/L (ref 96–112)
Creatinine, Ser: 0.62 mg/dL (ref 0.50–1.10)

## 2013-04-28 MED ORDER — FAT EMULSION 20 % IV EMUL
250.0000 mL | INTRAVENOUS | Status: AC
  Administered 2013-04-28: 250 mL via INTRAVENOUS
  Filled 2013-04-28: qty 250

## 2013-04-28 MED ORDER — TRACE MINERALS CR-CU-F-FE-I-MN-MO-SE-ZN IV SOLN
INTRAVENOUS | Status: AC
  Administered 2013-04-28: 17:00:00 via INTRAVENOUS
  Filled 2013-04-28: qty 2000

## 2013-04-28 NOTE — Progress Notes (Signed)
12 Days Post-Op  Subjective: Sore from surgery but no other new abdominal pain, tolerating clears, wants to eat more  Objective: Vital signs in last 24 hours: Temp:  [98 F (36.7 C)-98.1 F (36.7 C)] 98 F (36.7 C) (08/06 0546) Pulse Rate:  [64-70] 70 (08/06 0546) Resp:  [16-19] 16 (08/06 0546) BP: (96-107)/(59-63) 101/63 mmHg (08/06 0546) SpO2:  [100 %] 100 % (08/06 0546) Weight:  [60.374 kg (133 lb 1.6 oz)] 60.374 kg (133 lb 1.6 oz) (08/06 0500) Last BM Date: 04/27/13  Intake/Output from previous day: 08/05 0701 - 08/06 0700 In: 9 [I.V.:9] Out: 1630 [Urine:1100; Drains:5; Stool:525] Intake/Output this shift:    General appearance: alert and cooperative Resp: clear to auscultation bilaterally Cardio: regular rate and rhythm GI: ostomy with output, midline wound clean between staples, +BS, JP output minimal, perc drain with clear green output Neurologic: Mental status: Alert, oriented, thought content appropriate  Lab Results:   Recent Labs  04/26/13 0450 04/28/13 0555  WBC 14.2* 9.8  HGB 8.5* 8.5*  HCT 25.7* 26.1*  PLT 412* 585*   BMET  Recent Labs  04/27/13 0500 04/28/13 0555  NA 134* 132*  K 3.8 4.6  CL 98 99  CO2 28 26  GLUCOSE 116* 126*  BUN 10 13  CREATININE 0.69 0.62  CALCIUM 10.1 10.1   PT/INR No results found for this basename: LABPROT, INR,  in the last 72 hours ABG No results found for this basename: PHART, PCO2, PO2, HCO3,  in the last 72 hours  Studies/Results: No results found.  Anti-infectives: Anti-infectives   Start     Dose/Rate Route Frequency Ordered Stop   04/25/13 0800  levofloxacin (LEVAQUIN) IVPB 500 mg     500 mg 100 mL/hr over 60 Minutes Intravenous Every 24 hours 04/25/13 0655     04/16/13 2200  clindamycin (CLEOCIN) IVPB 900 mg     900 mg 100 mL/hr over 30 Minutes Intravenous 3 times per day 04/16/13 1807 04/16/13 2218   04/16/13 0922  clindamycin (CLEOCIN) 600 MG/50ML IVPB  Status:  Discontinued    Comments:   GALLMAN, KATHIE JEAN: cabinet override      04/16/13 0922 04/16/13 0925   04/16/13 0600  clindamycin (CLEOCIN) IVPB 900 mg     900 mg 100 mL/hr over 30 Minutes Intravenous On call to O.R. 04/15/13 1433 04/16/13 1227   04/16/13 0600  gentamicin (GARAMYCIN) 280.4 mg in dextrose 5 % 100 mL IVPB     5 mg/kg  56.1 kg 107 mL/hr over 60 Minutes Intravenous On call to O.R. 04/15/13 1433 04/16/13 1221      Assessment/Plan: s/p Procedure(s): COLOSTOMY REVISION, REPAIR COLOSTOMY FISTULA (N/A) EXPLORATORY LAPAROTOMY,Partial Colectomy,Small bowel resection (N/A) APPENDECTOMY (N/A) SB fistula - clears only for now, watch drain output closely PCM - TNA ID - WBC down, on levaquin VTE - heparin SQ  LOS: 12 days    Vanessa [name] 04/28/2013

## 2013-04-28 NOTE — Progress Notes (Signed)
PARENTERAL NUTRITION CONSULT NOTE  Pharmacy Consult for TPN Indication: chronic malnutrition, on home TPN   Patient Measurements: Height: 5\' 4"  (162.6 cm) Weight: 133 lb 1.6 oz (60.374 kg) IBW/kg (Calculated) : 54.7   Vital Signs: Temp: 98 F (36.7 C) (08/06 0546) Temp src: Oral (08/06 0546) BP: 101/63 mmHg (08/06 0546) Pulse Rate: 70 (08/06 0546) Intake/Output from previous day: 08/05 0701 - 08/06 0700 In: 9 [I.V.:9] Out: 1630 [Urine:1100; Drains:5; Stool:525] Intake/Output from this shift:    Labs:  Recent Labs  04/26/13 0450 04/28/13 0555  WBC 14.2* 9.8  HGB 8.5* 8.5*  HCT 25.7* 26.1*  PLT 412* 585*     Recent Labs  04/26/13 0450 04/26/13 1143 04/26/13 1145 04/27/13 0500 04/28/13 0555  NA 135  --   --  134* 132*  K 3.6  --   --  3.8 4.6  CL 97  --   --  98 99  CO2 30  --   --  28 26  GLUCOSE 82  --   --  116* 126*  BUN 9  --   --  10 13  CREATININE 0.72  --   --  0.69 0.62  CALCIUM 9.6  --   --  10.1 10.1  MG  --   --   --  1.6  --   PHOS  --   --   --  2.8  --   PROT  --   --   --  6.5  --   ALBUMIN  --   --   --  2.2*  --   AST  --   --   --  22  --   ALT  --   --   --  17  --   ALKPHOS  --   --   --  108  --   BILITOT  --   --   --  0.2*  --   PREALBUMIN  --   --  9.7* 9.4*  --   TRIG  --  176*  --  152*  --    Estimated Creatinine Clearance: 83.1 ml/min (by C-G formula based on Cr of 0.62).   No results found for this basename: GLUCAP,  in the last 72 hours  Current Nutrition:  Cyclic TPN 1920 mls over 14 hours + lipids 17.8 ml/hr over 14 hours provides 1862 kcals and 96 gms of protein (meets 100% estimated needs) + reg diet  Nutritional Goals:  1700-2000 kCal, 80-95 grams of protein per day per RD recommendations 7/30  Assessment:  Patient known to pharmacy from past TPN consults. She has a prolapsed ostomy which is s/p repair she is now s/p colostomy revision, repair colostomy fistula, exp lap, Partial colectomy. Small bowel resection,  appendectomy. Patient has extensive GI history.  She was on TPN per [facility] at home previously, but per [facility] she has been off TPN now for about 1 month. TPN resumed 7/28 PM for ileus. TNA was stopped 8/2 am b/c she was tolerating regular diet. 8/3 intraabdominal abscess w/ probable small bowel dehiscence. Probably a breakdown of her anastomosis in the RLQ. New EC fistula. 8/3 placement of drain into peritoneal fluid collection.  8/4 likely breakdown of anastomosis in the RLQ, new EC fisula, to resume TPN, NPO until output goes down  GI: s/p COLOSTOMY REVISION, REPAIR COLOSTOMY FISTULA (N/A)  EXPLORATORY LAPAROTOMY,Partial Colectomy,Small bowel resection (N/A)  APPENDECTOMY. has colostomy LUQ.  NGT out 7/30 PM.  . Baseline  prealbumin 11.2 on 04/19/13 - could reflect post-op stat.   Diet advanced to full liquids yesterday  Endo: no hx, has not required insulin in her TPN in the past, serum glucose acceptable at 116  Lytes: K rising - follow (goal >4), on home kdur 20 tid + lasix 40 po daily. Mag low NL at 1.6 (goal>2).  Calcium corrects to 11.5. (Ca x phos < 55).  Noted patient has accumulated lytes in the past and has required lyte free formula  Renal: SCr stable, CrCl ~25mL/min  Cards: cardiomyopathy (EF 5/10 20%, BNP 1234 5/28). On bidil, lisinipril, spironolactone, coreg, lasix, kdur; has LifeVest.   Hepatobil: LFTs wnl, admission albumin 3.3. Doppler 7/29 negative for DVT.  ID:  levaquin per pharmacy for abnormal UA, WBC down to 14.2 from 16.3. T max 99.3.  levaquin 500 mg IV q24 for creat cl ~80 for UTI 8/3 UC IP 8/3 abscess cx pending levaquin 8/3 >>  Best Practices: warfarin (DVT 5/22)  Dr. [name] does NOT plan to resume warf 2nd no dVT 7/29 doppler. Hg 8.5. Got 1 unit PRBC 7/30.  on sq heparin for VTE px  TPN Access: PICC   TPN day#:  (started 04/19/13 then stopped 8/2 am and restarted 8/4 PM)   Plan:  - Continue Clinimix E 5/15 with a 14 hr cycle at 52mL/hr x1 hour, then 130mL/hr over  12 hours, then 65mL/hr x1 hour for a total of over 14 hours  - Continue cyclic lipids at 17.8 ml/hr over 14 hours - Continue NS at [facility] - F/u TNA standing labs - Continue Levofloxacin 500 mg IV q24h  Yuna Pizzolato L. Illene Bolus, PharmD, BCPS Clinical Pharmacist Pager: [phone] Pharmacy: [phone] 04/28/2013 8:16 AM

## 2013-04-29 LAB — COMPREHENSIVE METABOLIC PANEL
BUN: 17 mg/dL (ref 6–23)
CO2: 27 mEq/L (ref 19–32)
Calcium: 10.7 mg/dL — ABNORMAL HIGH (ref 8.4–10.5)
Chloride: 97 mEq/L (ref 96–112)
Creatinine, Ser: 0.66 mg/dL (ref 0.50–1.10)
GFR calc Af Amer: >90 mL/min (ref >90)
GFR calc non Af Amer: >90 mL/min (ref >90)
Glucose, Bld: 110 mg/dL — ABNORMAL HIGH (ref 70–99)
Total Bilirubin: 0.2 mg/dL — ABNORMAL LOW (ref 0.3–1.2)

## 2013-04-29 LAB — CBC
HCT: 28.1 % — ABNORMAL LOW (ref 36.0–46.0)
MCH: 26.7 pg (ref 26.0–34.0)
MCV: 81.4 fL (ref 78.0–100.0)
RBC: 3.45 MIL/uL — ABNORMAL LOW (ref 3.87–5.11)
WBC: 9 10*3/uL (ref 4.0–10.5)

## 2013-04-29 LAB — PHOSPHORUS: Phosphorus: 3.1 mg/dL (ref 2.3–4.6)

## 2013-04-29 LAB — MAGNESIUM: Magnesium: 1.9 mg/dL (ref 1.5–2.5)

## 2013-04-29 MED ORDER — ENOXAPARIN SODIUM 40 MG/0.4ML ~~LOC~~ SOLN
40.0000 mg | SUBCUTANEOUS | Status: DC
  Administered 2013-04-30 – 2013-05-05 (×6): 40 mg via SUBCUTANEOUS
  Filled 2013-04-29 (×10): qty 0.4

## 2013-04-29 MED ORDER — TRACE MINERALS CR-CU-F-FE-I-MN-MO-SE-ZN IV SOLN
INTRAVENOUS | Status: AC
  Administered 2013-04-29: 18:00:00 via INTRAVENOUS
  Filled 2013-04-29: qty 2000

## 2013-04-29 MED ORDER — FAT EMULSION 20 % IV EMUL
250.0000 mL | INTRAVENOUS | Status: AC
  Administered 2013-04-29: 250 mL via INTRAVENOUS
  Filled 2013-04-29: qty 250

## 2013-04-29 MED ORDER — POTASSIUM CHLORIDE ER 10 MEQ PO TBCR
20.0000 meq | EXTENDED_RELEASE_TABLET | Freq: Every day | ORAL | Status: DC
  Filled 2013-04-29: qty 2

## 2013-04-29 NOTE — Progress Notes (Signed)
13 Days Post-Op  Subjective: Tolerating fulls fine  Objective: Vital signs in last 24 hours: Temp:  [98.1 F (36.7 C)-98.4 F (36.9 C)] 98.4 F (36.9 C) (08/07 0630) Pulse Rate:  [60-67] 60 (08/07 0835) Resp:  [16] 16 (08/07 0630) BP: (97-110)/(51-71) 98/51 mmHg (08/07 0835) SpO2:  [100 %] 100 % (08/07 0630) Last BM Date: 04/28/13  Intake/Output from previous day: 08/06 0701 - 08/07 0700 In: 1210.8 [P.O.:120; I.V.:220; TPN:855.8] Out: 300 [Drains:75; Stool:225] Intake/Output this shift: Total I/O In: 0  Out: 250 [Urine:250]  General appearance: alert and cooperative Resp: clear to auscultation bilaterally Cardio: regular rate and rhythm GI: soft, incision clean, ostomy functional Neurologic: Mental status: Alert, oriented, thought content appropriate  Lab Results:   Recent Labs  04/28/13 0555 04/29/13 0500  WBC 9.8 9.0  HGB 8.5* 9.2*  HCT 26.1* 28.1*  PLT 585* 691*   BMET  Recent Labs  04/28/13 0555 04/29/13 0500  NA 132* 132*  K 4.6 4.9  CL 99 97  CO2 26 27  GLUCOSE 126* 110*  BUN 13 17  CREATININE 0.62 0.66  CALCIUM 10.1 10.7*   PT/INR No results found for this basename: LABPROT, INR,  in the last 72 hours ABG No results found for this basename: PHART, PCO2, PO2, HCO3,  in the last 72 hours  Studies/Results: No results found.  Anti-infectives: Anti-infectives   Start     Dose/Rate Route Frequency Ordered Stop   04/25/13 0800  levofloxacin (LEVAQUIN) IVPB 500 mg     500 mg 100 mL/hr over 60 Minutes Intravenous Every 24 hours 04/25/13 0655     04/16/13 2200  clindamycin (CLEOCIN) IVPB 900 mg     900 mg 100 mL/hr over 30 Minutes Intravenous 3 times per day 04/16/13 1807 04/16/13 2218   04/16/13 0922  clindamycin (CLEOCIN) 600 MG/50ML IVPB  Status:  Discontinued    Comments:  GALLMAN, KATHIE JEAN: cabinet override      04/16/13 0922 04/16/13 0925   04/16/13 0600  clindamycin (CLEOCIN) IVPB 900 mg     900 mg 100 mL/hr over 30 Minutes  Intravenous On call to O.R. 04/15/13 1433 04/16/13 1227   04/16/13 0600  gentamicin (GARAMYCIN) 280.4 mg in dextrose 5 % 100 mL IVPB     5 mg/kg  56.1 kg 107 mL/hr over 60 Minutes Intravenous On call to O.R. 04/15/13 1433 04/16/13 1221      Assessment/Plan: s/p Procedure(s): COLOSTOMY REVISION, REPAIR COLOSTOMY FISTULA (N/A) EXPLORATORY LAPAROTOMY,Partial Colectomy,Small bowel resection (N/A) APPENDECTOMY (N/A) SB fistula - output low, advance to reg diet PCM - TNA ID - WBC down, on levaquin VTE - heparin SQ  LOS: 13 days    Daltyn Degroat E 04/29/2013

## 2013-04-29 NOTE — Progress Notes (Signed)
PARENTERAL NUTRITION/ANTIBIOTIC CONSULT NOTE  Pharmacy Consult for TPN and Levaquin Indication: EC fistula and Ecoli peritoneal cavity abscess  Patient Measurements: Height: 5\' 4"  (162.6 cm) Weight: 133 lb 1.6 oz (60.374 kg) IBW/kg (Calculated) : 54.7   Vital Signs: Temp: 98.4 F (36.9 C) (08/07 0630) Temp src: Oral (08/07 0630) BP: 98/64 mmHg (08/07 0630) Pulse Rate: 60 (08/07 0630) Intake/Output from previous day: 08/06 0701 - 08/07 0700 In: 1210.8 [P.O.:120; I.V.:220; TPN:855.8] Out: 300 [Drains:75; Stool:225] Intake/Output from this shift:    Labs:  Recent Labs  04/28/13 0555 04/29/13 0500  WBC 9.8 9.0  HGB 8.5* 9.2*  HCT 26.1* 28.1*  PLT 585* 691*     Recent Labs  04/26/13 1143 04/26/13 1145 04/27/13 0500 04/28/13 0555 04/29/13 0500  NA  --   --  134* 132* 132*  K  --   --  3.8 4.6 4.9  CL  --   --  98 99 97  CO2  --   --  28 26 27   GLUCOSE  --   --  116* 126* 110*  BUN  --   --  10 13 17   CREATININE  --   --  0.69 0.62 0.66  CALCIUM  --   --  10.1 10.1 10.7*  MG  --   --  1.6  --  1.9  PHOS  --   --  2.8  --  3.1  PROT  --   --  6.5  --  7.4  ALBUMIN  --   --  2.2*  --  2.4*  AST  --   --  22  --  20  ALT  --   --  17  --  19  ALKPHOS  --   --  108  --  103  BILITOT  --   --  0.2*  --  0.2*  PREALBUMIN  --  9.7* 9.4*  --   --   TRIG 176*  --  152*  --   --    Estimated Creatinine Clearance: 83.1 ml/min (by C-G formula based on Cr of 0.66).   No results found for this basename: GLUCAP,  in the last 72 hours  Current Nutrition:  Cyclic TPN 1920 mls over 14 hours + lipids 17.8 ml/hr over 14 hours provides 1862 kcals and 96 gms of protein (meets 100% estimated needs) + full liquid diet  Nutritional Goals:  1700-2000 kCal, 80-95 grams of protein per day per RD recommendations 7/30  Assessment:  Patient known to pharmacy from prior TPN consults. (She was on TPN per [facility] at home previously, but per [facility] she has been off TPN for about 1 month PTA).  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  Underwent the following procedures on 7/25:  COLOSTOMY REVISION  REPAIR COLOSTOMY FISTULA  EXPLORATORY LAPAROTOMY  PARTIAL COLECTOMY  APPENDECTOMY  Patient s/p CT guided abscess drain placement 8/3.  GI: Baseline prealbumin 11.2 on 04/19/13 - could reflect post-op state. Pt on full liquid diet. Drain o/p 75cc/24h.  Endo: no hx, has not required insulin in her TPN in the past, serum glucose remains nl  Lytes: K continues to rise - follow (goal >4), on home kdur 20 tid + lasix 40 po daily. Mag 1.9 (goal>2).  Calcium corrects to 12. (Ca x phos < 55).  Noted patient has accumulated lytes in the past and has required lyte free formula  Renal: SCr remains  stable, CrCl ~81mL/min  Cards: cardiomyopathy (EF 5/10 20%, BNP 1234 5/28). On bidil, lisinipril, spironolactone, coreg, lasix, kdur; has LifeVest.   Hepatobil: LFTs wnl, admission albumin 3.3.   ID:  Levaquin per pharmacy for UTI/Ecoli in peritoneal cavity. WBC down to nl. Afeb.   8/3 Urine cx>> 60kcaol multiple bacteria, none predominant 8/3 abscess cx (peritoneal cavity)>> Ecoli (pan sensitive)  levaquin 8/3 >>  Best Practices: warfarin (DVT 5/22)  Dr. [name] does NOT plan to resume warf 2nd no DVT 7/29 doppler. Hgb 9.2 - stable. On sq heparin for VTE px  TPN Access: PICC   TPN day#:  (started 04/19/13 then stopped 8/2 am and restarted 8/4 PM)   Plan:  - Continue Clinimix E 5/15 with a 14 hr cycle at 31mL/hr x1 hour, then 160mL/hr over 12 hours, then 72mL/hr x1 hour for a total of over 14 hours  - Continue cyclic lipids at 17.8 ml/hr over 14 hours - Will d/c po KCl for now with electrolytes in TPN and K trending up. If TPN d/c, will probably need to restart po KCl - F/u TNA standing labs - Continue Levofloxacin 500 mg IV q24h - consider narrow to Rocephin with pan-sensitive Ecoli.  Christoper Fabian, PharmD,  BCPS Clinical pharmacist, pager [phone] 04/29/2013 7:35 AM

## 2013-04-30 LAB — CBC
HCT: 28.6 % — ABNORMAL LOW (ref 36.0–46.0)
MCHC: 31.8 g/dL (ref 30.0–36.0)
Platelets: 669 10*3/uL — ABNORMAL HIGH (ref 150–400)
RDW: 17.8 % — ABNORMAL HIGH (ref 11.5–15.5)
WBC: 13.5 10*3/uL — ABNORMAL HIGH (ref 4.0–10.5)

## 2013-04-30 MED ORDER — CLINIMIX E/DEXTROSE (5/15) 5 % IV SOLN
INTRAVENOUS | Status: AC
  Administered 2013-04-30: 18:00:00 via INTRAVENOUS
  Filled 2013-04-30: qty 2000

## 2013-04-30 MED ORDER — FAT EMULSION 20 % IV EMUL
250.0000 mL | INTRAVENOUS | Status: AC
  Administered 2013-04-30: 250 mL via INTRAVENOUS
  Filled 2013-04-30: qty 250

## 2013-04-30 NOTE — Progress Notes (Signed)
PARENTERAL NUTRITION CONSULT NOTE  Pharmacy Consult for TPN Indication: EC fistula  Patient Measurements: Height: 5\' 4"  (162.6 cm) Weight: 133 lb 1.6 oz (60.374 kg) IBW/kg (Calculated) : 54.7   Vital Signs: Temp: 98.3 F (36.8 C) (08/08 0520) Temp src: Oral (08/08 0520) BP: 89/46 mmHg (08/08 0520) Pulse Rate: 60 (08/08 0520) Intake/Output from previous day: 08/07 0701 - 08/08 0700 In: 30  Out: 1800 [Urine:1450; Stool:350] Intake/Output from this shift:    Labs:  Recent Labs  04/28/13 0555 04/29/13 0500 04/30/13 0605  WBC 9.8 9.0 13.5*  HGB 8.5* 9.2* 9.1*  HCT 26.1* 28.1* 28.6*  PLT 585* 691* 669*     Recent Labs  04/28/13 0555 04/29/13 0500  NA 132* 132*  K 4.6 4.9  CL 99 97  CO2 26 27  GLUCOSE 126* 110*  BUN 13 17  CREATININE 0.62 0.66  CALCIUM 10.1 10.7*  MG  --  1.9  PHOS  --  3.1  PROT  --  7.4  ALBUMIN  --  2.4*  AST  --  20  ALT  --  19  ALKPHOS  --  103  BILITOT  --  0.2*   Estimated Creatinine Clearance: 83.1 ml/min (by C-G formula based on Cr of 0.66).   No results found for this basename: GLUCAP,  in the last 72 hours  Current Nutrition:  Cyclic TPN 1920 mls over 14 hours + lipids 17.8 ml/hr over 14 hours provides 1862 kcals and 96 gms of protein (meets 100% estimated needs) + regular diet  Nutritional Goals:  1700-2000 kCal, 80-95 grams of protein per day per RD recommendations 7/30  Assessment:  Patient known to pharmacy from prior TPN consults. (She was on TPN per [facility] at home previously, but per [facility] she has been off TPN for about 1 month PTA). 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  Underwent the following procedures on 7/25:  COLOSTOMY REVISION  REPAIR COLOSTOMY FISTULA  EXPLORATORY LAPAROTOMY  PARTIAL COLECTOMY  APPENDECTOMY  Patient s/p CT guided abscess drain placement 8/3.  GI: Prealb down to 9.4 - will continue to follow trend. Pt now advanced to  regular diet - tolerating without nausea. Calorie count to start to see if TPN can be d/c.  Endo: no hx, has not required insulin in her TPN in the past, serum glucose remains nl  Lytes: No BMET today. Noted patient has accumulated lytes in the past and has required lyte free formula  Renal: SCr remains stable, CrCl ~61mL/min  Cards: cardiomyopathy (EF 5/10 20%, BNP 1234 5/28). On bidil, lisinipril, spironolactone, coreg, lasix; has LifeVest.   Hepatobil: LFTs wnl, admission albumin 3.3.   ID:  Levaquin Day #8 per pharmacy for UTI/Ecoli in peritoneal cavity. WBC elevated. Afeb.   8/3 Urine cx>> 60kcaol multiple bacteria, none predominant 8/3 abscess cx (peritoneal cavity)>> Ecoli (pan sensitive)  levaquin 8/3 >>  Best Practices: warfarin (DVT 5/22)  Dr. [name] does NOT plan to resume warf 2nd no DVT 7/29 doppler. Hgb 9.1 - stable. On sq heparin for VTE px  TPN Access: PICC   TPN day#:  (started 04/19/13 then stopped 8/2 am and restarted 8/4 PM)   Plan:  - Continue Clinimix E 5/15 with a 14 hr cycle at 70mL/hr x1 hour, then 124mL/hr over 12 hours, then 35mL/hr x1 hour for a total of over 14 hours  - Continue cyclic lipids at 17.8 ml/hr over 14 hours - F/u  BMET in a.m. - F/u calorie count and ability to wean or d/c TPN over the next few days  Christoper Fabian, PharmD, BCPS Clinical pharmacist, pager [phone] 04/30/2013 8:12 AM

## 2013-04-30 NOTE — Progress Notes (Signed)
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   Underwent the following procedures on 7/25:  COLOSTOMY REVISION  REPAIR COLOSTOMY FISTULA  EXPLORATORY LAPAROTOMY  PARTIAL COLECTOMY  APPENDECTOMY   Patient s/p CT guided abscess drain placement 8/3.   Patient is receiving cyclic TPN with Clinimix E 5/15 over 14 hours: 36mL/hr x1 hour, then 122mL/hr over 12 hours, then 15mL/hr x1 hour. Lipids (20% IVFE @ 17.8 ml/hr), multivitamins, and trace elements over 14 hours. Provides 1861 total kcal and 96 grams protein daily. Meets 100% estimated kcal and protein needs.  Patient reports her appetite is doing better.  No nausea or vomiting.  Declined addition of nutrition supplements.  48 hour calorie count ordered.  Height: Ht Readings from Last 1 Encounters:  04/18/13 5\' 4"  (1.626 m)    Weight Status:   Wt Readings from Last 1 Encounters:  04/28/13 133 lb 1.6 oz (60.374 kg)    Re-estimated needs:  Kcal: 1700-2000 Protein: 80-95 gm Fluid: 1.7-2.0 L  Skin: LLQ colostomy  Diet Order: General   Intake/Output Summary (Last 24 hours) at 04/30/13 1204 Last data filed at 04/30/13 0500  Gross per 24 hour  Intake     30 ml  Output   1550 ml  Net  -1520 ml    Labs:   Recent Labs Lab 04/27/13 0500 04/28/13 0555 04/29/13 0500  NA 134* 132* 132*  K 3.8 4.6 4.9  CL 98 99 97  CO2 28 26 27   BUN 10 13 17   CREATININE 0.69 0.62 0.66  CALCIUM 10.1 10.1 10.7*  MG 1.6  --   1.9  PHOS 2.8  --  3.1  GLUCOSE 116* 126* 110*    Scheduled Meds: . carvedilol  18.75 mg Oral BID WC  . enoxaparin (LOVENOX) injection  40 mg Subcutaneous Q24H  . furosemide  40 mg Oral Daily  . isosorbide-hydrALAZINE  1 tablet Oral BID  . levofloxacin (LEVAQUIN) IV  500 mg Intravenous Q24H  . lisinopril  40 mg Oral Daily  . ondansetron (ZOFRAN) IV  4 mg Intravenous Q6H  . sodium chloride  10-40 mL Intracatheter Q12H  . spironolactone  12.5 mg Oral Daily    Continuous Infusions: . Marland KitchenTPN (CLINIMIX-E) Adult     And  . fat emulsion    . sodium chloride 20 mL/hr (04/27/13 2122)    Maureen Chatters, RD, LDN Pager #: [phone] After-Hours Pager #: [phone]

## 2013-04-30 NOTE — Progress Notes (Signed)
14 Days Post-Op  Subjective: Tolerated regular diet without nausea, claims drain output was a small amount but there was also air in the bag, no drain output recorded  Objective: Vital signs in last 24 hours: Temp:  [97.8 F (36.6 C)-98.4 F (36.9 C)] 98.3 F (36.8 C) (08/08 0520) Pulse Rate:  [60-74] 60 (08/08 0520) Resp:  [18-19] 18 (08/08 0520) BP: (89-99)/(46-64) 89/46 mmHg (08/08 0520) SpO2:  [100 %] 100 % (08/08 0520) Last BM Date: 04/29/13  Intake/Output from previous day: 08/07 0701 - 08/08 0700 In: 30  Out: 1800 [Urine:1450; Stool:350] Intake/Output this shift:    General appearance: alert and cooperative Resp: clear to auscultation bilaterally Cardio: regular rate and rhythm GI: soft, tender at perc drain site but site appears benign, midline wound clean, stoma with output Extremities: calves soft Neurologic: Mental status: Alert, oriented, thought content appropriate  Lab Results:   Recent Labs  04/28/13 0555 04/29/13 0500  WBC 9.8 9.0  HGB 8.5* 9.2*  HCT 26.1* 28.1*  PLT 585* 691*   BMET  Recent Labs  04/28/13 0555 04/29/13 0500  NA 132* 132*  K 4.6 4.9  CL 99 97  CO2 26 27  GLUCOSE 126* 110*  BUN 13 17  CREATININE 0.62 0.66  CALCIUM 10.1 10.7*   PT/INR No results found for this basename: LABPROT, INR,  in the last 72 hours ABG No results found for this basename: PHART, PCO2, PO2, HCO3,  in the last 72 hours  Studies/Results: No results found.  Anti-infectives: Anti-infectives   Start     Dose/Rate Route Frequency Ordered Stop   04/25/13 0800  levofloxacin (LEVAQUIN) IVPB 500 mg     500 mg 100 mL/hr over 60 Minutes Intravenous Every 24 hours 04/25/13 0655     04/16/13 2200  clindamycin (CLEOCIN) IVPB 900 mg     900 mg 100 mL/hr over 30 Minutes Intravenous 3 times per day 04/16/13 1807 04/16/13 2218   04/16/13 0922  clindamycin (CLEOCIN) 600 MG/50ML IVPB  Status:  Discontinued    Comments:  GALLMAN, KATHIE JEAN: cabinet override     04/16/13 0922 04/16/13 0925   04/16/13 0600  clindamycin (CLEOCIN) IVPB 900 mg     900 mg 100 mL/hr over 30 Minutes Intravenous On call to O.R. 04/15/13 1433 04/16/13 1227   04/16/13 0600  gentamicin (GARAMYCIN) 280.4 mg in dextrose 5 % 100 mL IVPB     5 mg/kg  56.1 kg 107 mL/hr over 60 Minutes Intravenous On call to O.R. 04/15/13 1433 04/16/13 1221      Assessment/Plan: s/p Procedure(s): COLOSTOMY REVISION, REPAIR COLOSTOMY FISTULA (N/A) EXPLORATORY LAPAROTOMY,Partial Colectomy,Small bowel resection (N/A) APPENDECTOMY (N/A) SB fistula - output low, on reg diet, continue perc drain PCM - TNA ID - on levaquin empiric VTE - heparin changed to lovenox per patient request yesterday FEN - calorie count to see if we can consider D/C TNA  LOS: 14 days    Vanessa [name] E 04/30/2013

## 2013-05-01 LAB — BASIC METABOLIC PANEL
BUN: 27 mg/dL — ABNORMAL HIGH (ref 6–23)
GFR calc Af Amer: >90 mL/min (ref >90)
GFR calc non Af Amer: 82 mL/min — ABNORMAL LOW (ref >90)
Potassium: 4.9 mEq/L (ref 3.5–5.1)
Sodium: 129 mEq/L — ABNORMAL LOW (ref 135–145)

## 2013-05-01 MED ORDER — FAT EMULSION 20 % IV EMUL
250.0000 mL | INTRAVENOUS | Status: AC
  Administered 2013-05-01: 250 mL via INTRAVENOUS
  Filled 2013-05-01: qty 250

## 2013-05-01 MED ORDER — TRACE MINERALS CR-CU-F-FE-I-MN-MO-SE-ZN IV SOLN
INTRAVENOUS | Status: AC
  Administered 2013-05-01: 18:00:00 via INTRAVENOUS
  Filled 2013-05-01: qty 2000

## 2013-05-01 NOTE — Progress Notes (Signed)
15 Days Post-Op   Assessment: s/p Procedure(s): COLOSTOMY REVISION, REPAIR COLOSTOMY FISTULA EXPLORATORY LAPAROTOMY,Partial Colectomy,Small bowel resection APPENDECTOMY Patient Active Problem List   Diagnosis Date Noted  . Chronic combined systolic and diastolic heart failure, NYHA class 1 04/06/2013  . Long term (current) use of anticoagulants 02/23/2013  . Anticoagulation goal of INR 2 to 3, secondary to DVT 02/23/2013  . Protein-calorie malnutrition, severe 02/19/2013  . At risk for sudden cardiac death, now with life vest Mar 12, 2013  . Gross hematuria 02/15/2013  . Non-ischemic cardiomyopathy, EF 25 % 02/14/2013  . Colostomy prolapse, with revision  02/14/2013  . Anemia, secondary to blood loss 02/14/2013  . Hypokalemia 02/14/2013  . DVT, femoral, acute 02/12/2013  . 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 Pulmonary HTN, severe 02/11/2013  . Bilateral lower extremity edema 02/11/2013  . Essential hypertension, benign 02/08/2013  . Thrombocytosis 02/08/2013  . 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 Postop check 09/22/2012  . 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 S/P left/sigmoid colectomy with anastomotic leak- ileostomy 03/05/2012  . Ileus following gastrointestinal surgery 03/05/2012  . On total parenteral nutrition 03/05/2012  . Abscess, peritoneal 03/05/2012  . Hemorrhage of rectum and anus 02/20/2012  . Acute posthemorrhagic anemia 02/20/2012  . Thrombocytopenia due to medication 02/20/2012  . 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 Pyelonephritis 01/27/2012  . Nausea & vomiting 01/27/2012  . Hypercalcemia 01/27/2012  . Cervical cancer 01/07/2012  . Post-radiation rectal bleeding 01/07/2012    Stable. Seems to have a little more out the drain today; no new issues  Plan: No Change  Subjective: No new issues identified  Objective: Vital signs in last 24 hours: Temp:  [97.9 F (36.6  C)-98.1 F (36.7 C)] 98.1 F (36.7 C) (08/09 0529) Pulse Rate:  [60-73] 60 (08/09 1012) Resp:  [18-20] 18 (08/09 0529) BP: (72-101)/(40-50) 91/45 mmHg (08/09 1012) SpO2:  [98 %-100 %] 100 % (08/09 0529) Weight:  [128 lb 14.4 oz (58.469 kg)] 128 lb 14.4 oz (58.469 kg) (08/09 0500)   Intake/Output from previous day: 08/08 0701 - 08/09 0700 In: 2272 [P.O.:360; TPN:1912] Out: 725 [Drains:125; Stool:600]  General appearance: alert, cooperative, fatigued and no distress Resp: clear to auscultation bilaterally GI: Abd mildly tender, but apparently no change, ostomoy working and drain with some output in bag. Looks like 125 cc overnight per I&O sheet  Incision: no significant drainage  Lab Results:   Recent Labs  04/29/13 0500 04/30/13 0605  WBC 9.0 13.5*  HGB 9.2* 9.1*  HCT 28.1* 28.6*  PLT 691* 669*   BMET  Recent Labs  04/29/13 0500 05/01/13 0445  NA 132* 129*  K 4.9 4.9  CL 97 95*  CO2 27 24  GLUCOSE 110* 109*  BUN 17 27*  CREATININE 0.66 0.89  CALCIUM 10.7* 10.6*    MEDS, Scheduled . carvedilol  18.75 mg Oral BID WC  . enoxaparin (LOVENOX) injection  40 mg Subcutaneous Q24H  . furosemide  40 mg Oral Daily  . isosorbide-hydrALAZINE  1 tablet Oral BID  . levofloxacin (LEVAQUIN) IV  500 mg Intravenous Q24H  . lisinopril  40 mg Oral Daily  . ondansetron (ZOFRAN) IV  4 mg Intravenous Q6H  . sodium chloride  10-40 mL Intracatheter Q12H  . spironolactone  12.5 mg Oral Daily    Studies/Results: No results found.    LOS: 15 days     Currie Paris, MD, [facility], [place]  [phone]   05/01/2013 11:18 AM

## 2013-05-01 NOTE — Progress Notes (Signed)
PARENTERAL NUTRITION CONSULT NOTE  Pharmacy Consult for TPN Indication: EC fistula  Patient Measurements: Height: 5\' 4"  (162.6 cm) Weight: 128 lb 14.4 oz (58.469 kg) IBW/kg (Calculated) : 54.7   Vital Signs: Temp: 98.1 F (36.7 C) (08/09 0529) Temp src: Oral (08/09 0529) BP: 101/45 mmHg (08/09 0529) Pulse Rate: 60 (08/09 0529) Intake/Output from previous day: 08/08 0701 - 08/09 0700 In: 2272 [P.O.:360; TPN:1912] Out: 725 [Drains:125; Stool:600] Intake/Output from this shift:    Labs:  Recent Labs  04/29/13 0500 04/30/13 0605  WBC 9.0 13.5*  HGB 9.2* 9.1*  HCT 28.1* 28.6*  PLT 691* 669*     Recent Labs  04/29/13 0500 05/01/13 0445  NA 132* 129*  K 4.9 4.9  CL 97 95*  CO2 27 24  GLUCOSE 110* 109*  BUN 17 27*  CREATININE 0.66 0.89  CALCIUM 10.7* 10.6*  MG 1.9  --   PHOS 3.1  --   PROT 7.4  --   ALBUMIN 2.4*  --   AST 20  --   ALT 19  --   ALKPHOS 103  --   BILITOT 0.2*  --    Estimated Creatinine Clearance: 74.7 ml/min (by C-G formula based on Cr of 0.89).   No results found for this basename: GLUCAP,  in the last 72 hours  Current Nutrition:  Cyclic TPN 1920 mls over 14 hours + lipids 17.8 ml/hr over 14 hours provides 1862 kcals and 96 gms of protein (meets 100% estimated needs) + regular diet  Nutritional Goals:  1700-2000 kCal, 80-95 grams of protein per day per RD recommendations 7/30  Assessment:  Patient known to pharmacy from prior TPN consults. (She was on TPN per [facility] at home previously, but per [facility] she has been off TPN for about 1 month PTA). 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  Underwent the following procedures on 7/25:  COLOSTOMY REVISION  REPAIR COLOSTOMY FISTULA  EXPLORATORY LAPAROTOMY  PARTIAL COLECTOMY  APPENDECTOMY  Patient s/p CT guided abscess drain placement 8/3.  GI: Prealb down to 9.4 - will  follow trend. Pt on regular diet - tolerating w/o  nausea. Calorie count x 48 hr - results on Mon.  Endo: no hx, has not required insulin in her TPN in the past, serum gluc remains nl  Lytes: Na 129 - remains low - will watch trend. I/O appears ~equal past few days. K 4.9 - stable with d/c of oral K supplements. Corr Ca 11.9 (Ca x Phos <55). Noted pt has accumulated lytes in the past and has required lyte free formula  Renal: SCr with upward trend - will watch. CrCl ~36mL/min  Cards: cardiomyopathy (EF 5/10 20%, BNP 1234 5/28). On bidil, lisinipril, spironolactone, coreg, lasix; has LifeVest.   Hepatobil: LFTs wnl, admit alb 3.3.   ID:  Levaquin Day #9 for UTI/Ecoli in peritoneal cavity. WBC elevated. Afeb.   8/3 Urine cx>> 60kcol multiple bacteria, none predominant 8/3 abscess cx (peritoneal cavity)>> Ecoli (pan sensitive)  levaquin 8/3 >>  Best Practices: warfarin (DVT 5/22)  Dr. [name] does NOT plan to resume warf 2nd no DVT 7/29 doppler. Hgb 9.1 - stable. On sq heparin for VTE px  TPN Access: PICC   TPN day#:  (started 04/19/13 then stopped 8/2 am and restarted 8/4 PM)   Plan:  - Continue Clinimix E 5/15 with a 14 hr cycle at 16mL/hr x1 hour, then 13mL/hr over 12 hours, then 38mL/hr x1 hour  for a total of over 14 hours  - Continue cyclic lipids at 17.8 ml/hr over 14 hours - F/u calorie count results on Monday and ability to wean or d/c TPN.  Christoper Fabian, PharmD, BCPS Clinical pharmacist, pager [phone] 05/01/2013 7:22 AM

## 2013-05-02 MED ORDER — FAT EMULSION 20 % IV EMUL
250.0000 mL | INTRAVENOUS | Status: AC
Start: 2013-05-02 — End: 2013-05-03
  Administered 2013-05-02: 250 mL via INTRAVENOUS
  Filled 2013-05-02: qty 250

## 2013-05-02 MED ORDER — CLINIMIX E/DEXTROSE (5/15) 5 % IV SOLN
INTRAVENOUS | Status: AC
  Administered 2013-05-02: 18:00:00 via INTRAVENOUS
  Filled 2013-05-02: qty 2000

## 2013-05-02 MED ORDER — HYDROMORPHONE HCL 2 MG PO TABS
1.0000 mg | ORAL_TABLET | ORAL | Status: DC | PRN
  Administered 2013-05-02 – 2013-05-03 (×3): 2 mg via ORAL
  Administered 2013-05-03 – 2013-05-05 (×3): 1 mg via ORAL
  Filled 2013-05-02 (×5): qty 1

## 2013-05-02 NOTE — Progress Notes (Signed)
16 Days Post-Op  Subjective: She says she drops her BP taking allot of IV dilaudid, ( mg yesterday.)  She is not taking anything oral for pain She is also on coreg,Bidil, aldactone and lisinopril for her CHF. She says percocet, oxyIR makes her "sick on her stomach."  Objective: Vital signs in last 24 hours: Temp:  [98.7 F (37.1 C)-99 F (37.2 C)] 98.7 F (37.1 C) (08/10 0526) Pulse Rate:  [66-69] 66 (08/10 0836) Resp:  [18] 18 (08/10 0526) BP: (90-118)/(51-65) 118/65 mmHg (08/10 0836) SpO2:  [100 %] 100 % (08/10 0526) Weight:  [59.1 kg (130 lb 4.7 oz)] 59.1 kg (130 lb 4.7 oz) (08/10 0526) Last BM Date: 05/01/13 Drain 20 ml cloudy drainage recorded. Diet: TNA and Regular Intake/Output from previous day: 08/09 0701 - 08/10 0700 In: 4461.7 [P.O.:480; I.V.:220.3; TPN:3741.4] Out: 2470 [Urine:2000; Drains:20; Stool:450] Intake/Output this shift:    General appearance: alert, cooperative and no distress Resp: clear to auscultation bilaterally GI: soft, open area is clean, +BS, drain cloudy appearing fluid.  Lab Results:   Recent Labs  04/30/13 0605  WBC 13.5*  HGB 9.1*  HCT 28.6*  PLT 669*    BMET  Recent Labs  05/01/13 0445  NA 129*  K 4.9  CL 95*  CO2 24  GLUCOSE 109*  BUN 27*  CREATININE 0.89  CALCIUM 10.6*   PT/INR No results found for this basename: LABPROT, INR,  in the last 72 hours   Recent Labs Lab 04/27/13 0500 04/29/13 0500  AST 22 20  ALT 17 19  ALKPHOS 108 103  BILITOT 0.2* 0.2*  PROT 6.5 7.4  ALBUMIN 2.2* 2.4*     Lipase     Component Value Date/Time   LIPASE 8* 02/01/2013 0210     Studies/Results: No results found.  Medications: . carvedilol  18.75 mg Oral BID WC  . enoxaparin (LOVENOX) injection  40 mg Subcutaneous Q24H  . furosemide  40 mg Oral Daily  . isosorbide-hydrALAZINE  1 tablet Oral BID  . levofloxacin (LEVAQUIN) IV  500 mg Intravenous Q24H  . lisinopril  40 mg Oral Daily  . ondansetron (ZOFRAN) IV  4 mg  Intravenous Q6H  . sodium chloride  10-40 mL Intracatheter Q12H  . spironolactone  12.5 mg Oral Daily    Assessment/Plan S/p radiation colitis, hx of CHF,DVT,and fistulization of prior colostomy.  Prolapsed colostomy with colocutaneous and enterocolonic fistula S/p COLOSTOMY REVISION, REPAIR COLOCUTANEOUS FISTULA  EXPLORATORY LAPAROTOMY,Partial Colectomy,Small bowel resection  APPENDECTOMY, Extensive Enterolysis. 04/16/2013,  Cherylynn Ridges, MD SB fistula  PCM - cyclic TNa/ Regular diet. Patient Active Problem List   Diagnosis Date Noted  . Chronic combined systolic and diastolic heart failure, NYHA class 1 04/06/2013  . Long term (current) use of anticoagulants 02/23/2013  . Anticoagulation goal of INR 2 to 3, secondary to DVT 02/23/2013  . Protein-calorie malnutrition, severe 02/19/2013  . At risk for sudden cardiac death, now with life vest 02/28/13  . Gross hematuria 02/15/2013  . Non-ischemic cardiomyopathy, EF 25 % 02/14/2013  . Colostomy prolapse, with revision  02/14/2013  . Anemia, secondary to blood loss 02/14/2013  . Hypokalemia 02/14/2013  . DVT, femoral, acute 02/12/2013  . 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 Pulmonary HTN, severe 02/11/2013  . Bilateral lower extremity edema 02/11/2013  . Essential hypertension, benign 02/08/2013  . Thrombocytosis 02/08/2013  . 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 Postop check 09/22/2012  . Ileostomy in place  05/26/2012  . S/P left/sigmoid colectomy with anastomotic leak- ileostomy 03/05/2012  . Ileus following gastrointestinal surgery 03/05/2012  . On total parenteral nutrition 03/05/2012  . Abscess, peritoneal 03/05/2012  . Hemorrhage of rectum and anus 02/20/2012  . Acute posthemorrhagic anemia 02/20/2012  . Thrombocytopenia due to medication 02/20/2012  . 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 Pyelonephritis 01/27/2012  . Nausea &  vomiting 01/27/2012  . Hypercalcemia 01/27/2012  . Cervical cancer 01/07/2012  . Post-radiation rectal bleeding 01/07/2012   Plan:  Try her on some oral dilaudid, and try to wean IV dilaudid; continue current Rx. BP range in the 84-100 range.  Continue current TNA support.       LOS: 16 days    Jacelynn Hayton 05/02/2013

## 2013-05-02 NOTE — Progress Notes (Signed)
Agree with A&P of WJ.PA. She remains comfortable, still with some drainage,not taking much PO

## 2013-05-02 NOTE — Progress Notes (Signed)
1730 05/02/13 nsg Dr. Janee Morn notified of pt's bp 96/62-97/60. Parameters for coreg given and said ok to give pain med.

## 2013-05-02 NOTE — Progress Notes (Addendum)
PARENTERAL NUTRITION/ANTIBIOTIC CONSULT NOTE  Pharmacy Consult for TPN and Levaquin Indication:  colocutaneous and enterocolonic fistula and UTI/Ecoli in peritoneal cavity  Patient Measurements: Height: 5\' 4"  (162.6 cm) Weight: 130 lb 4.7 oz (59.1 kg) IBW/kg (Calculated) : 54.7   Vital Signs: Temp: 98.7 F (37.1 C) (08/10 0526) Temp src: Oral (08/10 0526) BP: 90/57 mmHg (08/10 0526) Pulse Rate: 66 (08/10 0526) Intake/Output from previous day: 08/09 0701 - 08/10 0700 In: 4441.7 [P.O.:480; I.V.:220.3; TPN:3741.4] Out: 2470 [Urine:2000; Drains:20; Stool:450] Intake/Output from this shift:    Labs:  Recent Labs  04/30/13 0605  WBC 13.5*  HGB 9.1*  HCT 28.6*  PLT 669*     Recent Labs  05/01/13 0445  NA 129*  K 4.9  CL 95*  CO2 24  GLUCOSE 109*  BUN 27*  CREATININE 0.89  CALCIUM 10.6*   Estimated Creatinine Clearance: 74.7 ml/min (by C-G formula based on Cr of 0.89).   No results found for this basename: GLUCAP,  in the last 72 hours  Current Nutrition:  Cyclic TPN 1920 mls over 14 hours + lipids 17.8 ml/hr over 14 hours provides 1862 kcals and 96 gms of protein (meets 100% estimated needs) + regular diet  Nutritional Goals:  1700-2000 kCal, 80-95 grams of protein per day per RD recommendations 7/30  Assessment:  Patient known to pharmacy from prior TPN consults. (She was on TPN per [facility] at home previously, but per [facility] she has been off TPN for about 1 month PTA). 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  Underwent the following procedures on 7/25:  COLOSTOMY REVISION  REPAIR COLOSTOMY FISTULA  EXPLORATORY LAPAROTOMY  PARTIAL COLECTOMY  APPENDECTOMY  Patient s/p CT guided abscess drain placement 8/3.  GI: Prealb down to 9.4 - will  follow trend. Pt on regular diet - tolerating w/o nausea. Calorie count x 48 hr - results on Mon.  Endo: no hx, has not required insulin in her TPN in the  past, serum gluc remains nl  Lytes: No lytes today. 8/9 Na 129 - trending down- will watch. +2L yesterday but not all of UOP recorded accurately. K 4.9 - stable with d/c of oral K supplements. Corr Ca 11.9 (Ca x Phos <55). Noted pt has accumulated lytes in the past and has required lyte free formula  Renal: SCr with upward trend - will watch. CrCl ~58mL/min  Cards: cardiomyopathy (EF 5/10 20%, BNP 1234 5/28). On bidil, lisinipril, spironolactone, coreg, lasix; has LifeVest.   Hepatobil: LFTs wnl, admit alb 3.3.   ID:  Levaquin Day #10 for UTI/Ecoli in peritoneal cavity. WBC elevated. Afeb.   8/3 Urine cx>> 60kcol multiple bacteria, none predominant 8/3 abscess cx (peritoneal cavity)>> Ecoli (pan sensitive)  levaquin 8/3 >>  Best Practices: warfarin (DVT 5/22)  Dr. [name] does NOT plan to resume warf 2nd no DVT 7/29 doppler. Hgb 9.1 - stable. On sq heparin for VTE px  TPN Access: PICC   TPN day#:  (started 04/19/13 then stopped 8/2 am and restarted 8/4 PM)   Plan:  - Continue Clinimix E 5/15 with a 14 hr cycle at 61mL/hr x1 hour, then 167mL/hr over 12 hours, then 53mL/hr x1 hour for a total of over 14 hours  - Continue cyclic lipids at 17.8 ml/hr over 14 hours - F/u calorie count results on Monday and ability to wean or d/c TPN. - F/u TPN labs on Monday - Levaquin 500mg  IV q24h. Consider d/c antibiotic  as pt has completed 10-day course.   Christoper Fabian, PharmD, BCPS Clinical pharmacist, pager [phone] 05/02/2013 7:11 AM

## 2013-05-03 ENCOUNTER — Inpatient Hospital Stay (HOSPITAL_COMMUNITY): Payer: Medicaid Other

## 2013-05-03 ENCOUNTER — Encounter (HOSPITAL_COMMUNITY): Payer: Self-pay | Admitting: *Deleted

## 2013-05-03 LAB — CBC WITH DIFFERENTIAL/PLATELET
Basophils Relative: 1 % (ref 0–1)
Eosinophils Absolute: 0.3 10*3/uL (ref 0.0–0.7)
Eosinophils Relative: 2 % (ref 0–5)
Hemoglobin: 9.3 g/dL — ABNORMAL LOW (ref 12.0–15.0)
Lymphocytes Relative: 15 % (ref 12–46)
MCHC: 33 g/dL (ref 30.0–36.0)
Monocytes Relative: 12 % (ref 3–12)
Neutrophils Relative %: 70 % (ref 43–77)
RBC: 3.46 MIL/uL — ABNORMAL LOW (ref 3.87–5.11)
WBC: 13.3 10*3/uL — ABNORMAL HIGH (ref 4.0–10.5)

## 2013-05-03 LAB — COMPREHENSIVE METABOLIC PANEL
ALT: 37 U/L — ABNORMAL HIGH (ref 0–35)
AST: 36 U/L (ref 0–37)
Albumin: 2.6 g/dL — ABNORMAL LOW (ref 3.5–5.2)
Chloride: 98 mEq/L (ref 96–112)
Creatinine, Ser: 0.65 mg/dL (ref 0.50–1.10)
Sodium: 132 mEq/L — ABNORMAL LOW (ref 135–145)
Total Bilirubin: 0.2 mg/dL — ABNORMAL LOW (ref 0.3–1.2)

## 2013-05-03 LAB — TRIGLYCERIDES: Triglycerides: 67 mg/dL (ref <150)

## 2013-05-03 LAB — PREALBUMIN: Prealbumin: 21.8 mg/dL (ref 17.0–34.0)

## 2013-05-03 LAB — PHOSPHORUS: Phosphorus: 3.3 mg/dL (ref 2.3–4.6)

## 2013-05-03 MED ORDER — FAT EMULSION 20 % IV EMUL
250.0000 mL | INTRAVENOUS | Status: AC
  Administered 2013-05-03: 250 mL via INTRAVENOUS
  Filled 2013-05-03: qty 250

## 2013-05-03 MED ORDER — TRACE MINERALS CR-CU-F-FE-I-MN-MO-SE-ZN IV SOLN
INTRAVENOUS | Status: AC
  Administered 2013-05-03: 18:00:00 via INTRAVENOUS
  Filled 2013-05-03: qty 2000

## 2013-05-03 MED ORDER — IOHEXOL 300 MG/ML  SOLN
100.0000 mL | Freq: Once | INTRAMUSCULAR | Status: AC | PRN
  Administered 2013-05-03: 100 mL via INTRAVENOUS

## 2013-05-03 NOTE — Progress Notes (Signed)
PARENTERAL NUTRITION CONSULT NOTE  Pharmacy Consult for TPN Indication:  colocutaneous and enterocolonic fistula  Patient Measurements: Height: 5\' 4"  (162.6 cm) Weight: 130 lb 4.7 oz (59.1 kg) IBW/kg (Calculated) : 54.7  Vital Signs: Temp: 98.1 F (36.7 C) (08/11 0510) Temp src: Oral (08/10 2152) BP: 93/49 mmHg (08/11 0800) Pulse Rate: 80 (08/11 0800) Intake/Output from previous day: 08/10 0701 - 08/11 0700 In: 1628.1 [I.V.:159.7; IV Piggyback:800; TPN:668.4] Out: 3100 [Urine:2700; Stool:400] Intake/Output from this shift:    Labs:  Recent Labs  05/03/13 0500  WBC 13.3*  HGB 9.3*  HCT 28.2*  PLT 702*     Recent Labs  05/01/13 0445 05/03/13 0500  NA 129* 132*  K 4.9 4.7  CL 95* 98  CO2 24 25  GLUCOSE 109* 95  BUN 27* 19  CREATININE 0.89 0.65  CALCIUM 10.6* 11.2*  MG  --  2.0  PHOS  --  3.3  PROT  --  7.6  ALBUMIN  --  2.6*  AST  --  36  ALT  --  37*  ALKPHOS  --  111  BILITOT  --  0.2*  TRIG  --  67   Estimated Creatinine Clearance: 83.1 ml/min (by C-G formula based on Cr of 0.65).   No results found for this basename: GLUCAP,  in the last 72 hours  Current Nutrition:  Cyclic TPN 1920 mls over 14 hours + lipids 17.8 ml/hr over 14 hours provides 1862 kcals and 96 gms of protein (meets 100% estimated needs) + regular diet  Nutritional Goals:  1700-2000 kCal, 80-95 grams of protein per day per RD recommendations 8/8  Assessment:  Patient known to pharmacy from prior TPN consults. (She was on TPN per [facility] at home previously, but per [facility] she has been off TPN for about 1 month PTA). 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  Underwent the following procedures on 7/25:  COLOSTOMY REVISION  REPAIR COLOSTOMY FISTULA  EXPLORATORY LAPAROTOMY  PARTIAL COLECTOMY  APPENDECTOMY  Patient s/p CT guided abscess drain placement 8/3.  GI: Prealb down to 9.4 as of 8/5 - will  follow trend. Pt  on regular diet but MD noted not taking much PO - Calorie count x 48 hr - results to be reviewed today  Endo: No hx - AM glucose WNL, has not required insulin in her TPN in the past  Lytes: Na 132, K 4.7, Mg + Phos WNL - corr Ca high at 12.3 (Ca x phos = ~41). Noted pt has accumulated lytes in the past and has required lyte free formula  Renal: SCr stable at 0.65, UOP 1.9  Cards: cardiomyopathy (EF 5/10 20%, BNP 1234 5/28) - BP 93/49, HR 80 - coreg, lasix, bidil, lisinopril, spironolactone - lifevest, trigs 67   Hepatobil: ALT bumped up slightly, other LFTs WNL  ID:  Levaquin Day #11 for UTI/Ecoli in peritoneal cavity. WBC mildly elevated, afebrile  8/3 Urine cx>> 60kcol multiple bacteria, none predominant 8/3 abscess cx (peritoneal cavity)>> Ecoli (pan sensitive)  levaquin 8/3 >>  Best Practices: warfarin (DVT 5/22)  Dr. [name] does NOT plan to resume warf 2nd no DVT 7/29 doppler - CBC stable, lovenox for VTE proph  TPN Access: PICC   TPN day#:  (started 04/19/13 then stopped 8/2 am and restarted 8/4 PM)   Plan:  - Continue Clinimix E 5/15 with a 14 hr cycle at 81mL/hr x1 hour, then 135mL/hr over 12 hours, then 33mL/hr  x1 hour for a total of over 14 hours  - Continue cyclic lipids at 17.8 ml/hr over 14 hours - F/u calorie count results and ability to wean or d/c TPN.  [name], PharmD, BCPS Pager # [phone] 05/03/2013 8:32 AM

## 2013-05-03 NOTE — Progress Notes (Signed)
GS Progress Note Subjective: Patient seems to be doing well today.  Good appetite.  Minimal output from drain.  Colostomy working well.  Objective: Vital signs in last 24 hours: Temp:  [98.1 F (36.7 C)-98.6 F (37 C)] 98.1 F (36.7 C) (08/11 0510) Pulse Rate:  [59-81] 80 (08/11 0800) Resp:  [16-17] 16 (08/11 0510) BP: (84-98)/(49-62) 93/49 mmHg (08/11 0800) SpO2:  [100 %] 100 % (08/11 0510) Last BM Date: 05/01/13  Intake/Output from previous day: 08/10 0701 - 08/11 0700 In: 1628.1 [I.V.:159.7; IV Piggyback:800; TPN:668.4] Out: 3100 [Urine:2700; Stool:400] Intake/Output this shift:    Lungs: Clear  Abd: Soft, non-tender.  Good bowel sounds.  Midline wound almost closed  Extremities: No changes  Neuro: Intact  Lab Results: CBC   Recent Labs  05/03/13 0500  WBC 13.3*  HGB 9.3*  HCT 28.2*  PLT 702*   BMET  Recent Labs  05/01/13 0445 05/03/13 0500  NA 129* 132*  K 4.9 4.7  CL 95* 98  CO2 24 25  GLUCOSE 109* 95  BUN 27* 19  CREATININE 0.89 0.65  CALCIUM 10.6* 11.2*   PT/INR No results found for this basename: LABPROT, INR,  in the last 72 hours ABG No results found for this basename: PHART, PCO2, PO2, HCO3,  in the last 72 hours  Studies/Results: No results found.  Anti-infectives: Anti-infectives   Start     Dose/Rate Route Frequency Ordered Stop   04/25/13 0800  levofloxacin (LEVAQUIN) IVPB 500 mg     500 mg 100 mL/hr over 60 Minutes Intravenous Every 24 hours 04/25/13 0655     04/16/13 2200  clindamycin (CLEOCIN) IVPB 900 mg     900 mg 100 mL/hr over 30 Minutes Intravenous 3 times per day 04/16/13 1807 04/16/13 2218   04/16/13 0922  clindamycin (CLEOCIN) 600 MG/50ML IVPB  Status:  Discontinued    Comments:  GALLMAN, KATHIE JEAN: cabinet override      04/16/13 0922 04/16/13 0925   04/16/13 0600  clindamycin (CLEOCIN) IVPB 900 mg     900 mg 100 mL/hr over 30 Minutes Intravenous On call to O.R. 04/15/13 1433 04/16/13 1227   04/16/13 0600   gentamicin (GARAMYCIN) 280.4 mg in dextrose 5 % 100 mL IVPB     5 mg/kg  56.1 kg 107 mL/hr over 60 Minutes Intravenous On call to O.R. 04/15/13 1433 04/16/13 1221      Assessment/Plan: s/p Procedure(s): COLOSTOMY REVISION, REPAIR COLOSTOMY FISTULA EXPLORATORY LAPAROTOMY,Partial Colectomy,Small bowel resection APPENDECTOMY Repeat CT scan of the abdomen and pelvis today.  LOS: 17 days    Marta Lamas. [name], MD, FACS [phone] [facility] Surgery 05/03/2013

## 2013-05-03 NOTE — Progress Notes (Signed)
Calorie Count Follow Up   48 hour calorie count ordered 8/8.  Diet: Regular Supplements: none ---> patient declined  Breakfast 8/8: 400 kcals, 11 gm Lunch 8/8: 430 kcals, 15 gm  Breakfast 8/9: 60 kcals, 0 gm Lunch 9/9: 350 kcals, 12 gm  Breakfast 8/10: 300 kals, 10 gm Lunch 8/10: none Dinner 8/10: 120 kcals, 0 gm   8/11 AM Snacks: 290 kcals, 6 gm  Average total intake: 240 kcal (14% of minimum estimated needs)  7 protein (8% of minimum estimated needs)  Re-estimated needs:  Kcal: 1700-2000  Protein: 80-95 gm  Fluid: 1.7-2.0 L  Nutrition Dx: Altered GI function related to fistula as evidenced by need for TPN, ongoing  Goal: TPN to meet > 90% of estimated nutrition needs, met  Intervention:   D/C calorie count  TPN per pharmacy RD to follow for nutrition care plan  Maureen Chatters, RD, LDN Pager #: [phone] After-Hours Pager #: [phone]

## 2013-05-04 ENCOUNTER — Inpatient Hospital Stay (HOSPITAL_COMMUNITY): Payer: Medicaid Other

## 2013-05-04 MED ORDER — FAT EMULSION 20 % IV EMUL
250.0000 mL | INTRAVENOUS | Status: AC
  Administered 2013-05-04: 250 mL via INTRAVENOUS
  Filled 2013-05-04: qty 250

## 2013-05-04 MED ORDER — TRACE MINERALS CR-CU-F-FE-I-MN-MO-SE-ZN IV SOLN
INTRAVENOUS | Status: AC
  Administered 2013-05-04 (×2): via INTRAVENOUS
  Filled 2013-05-04: qty 2000

## 2013-05-04 MED ORDER — IOHEXOL 300 MG/ML  SOLN
25.0000 mL | INTRAMUSCULAR | Status: AC
  Administered 2013-05-04: 25 mL via ORAL

## 2013-05-04 NOTE — Progress Notes (Signed)
RLQ(old JP site) with moderate purulent drainage.

## 2013-05-04 NOTE — Progress Notes (Addendum)
GS Progress Note Subjective: Patient had CT scan of the abdomen with IV contrast only, but needed oral contrast also.  Will repeat today with oral contrast  Objective: Vital signs in last 24 hours: Temp:  [98.4 F (36.9 C)-98.8 F (37.1 C)] 98.4 F (36.9 C) (08/12 0655) Pulse Rate:  [73-80] 77 (08/12 0655) Resp:  [16-18] 16 (08/12 0655) BP: (81-102)/(47-66) 94/56 mmHg (08/12 0655) SpO2:  [100 %] 100 % (08/12 0655) Last BM Date: 05/03/13  Intake/Output from previous day: 08/11 0701 - 08/12 0700 In: 8945.4 [P.O.:840; I.V.:790; IV Piggyback:100; TPN:7215.4] Out: 325 [Drains:25; Stool:300] Intake/Output this shift:    Lungs: Clear  Abd: Soft, tender around the drain site.  About 10 cc of cloudy green fluid in bag.  25 cc recorded yesterday.  Extremities: No change  Neuro: Intact  Lab Results: CBC   Recent Labs  05/03/13 0500  WBC 13.3*  HGB 9.3*  HCT 28.2*  PLT 702*   BMET  Recent Labs  05/03/13 0500  NA 132*  K 4.7  CL 98  CO2 25  GLUCOSE 95  BUN 19  CREATININE 0.65  CALCIUM 11.2*   PT/INR No results found for this basename: LABPROT, INR,  in the last 72 hours ABG No results found for this basename: PHART, PCO2, PO2, HCO3,  in the last 72 hours  Studies/Results: Ct Abdomen Pelvis W Contrast  05/03/2013   *RADIOLOGY REPORT*  Clinical Data: Postop abdominal abscess, status post percutaneous drainage  CT ABDOMEN AND PELVIS WITH CONTRAST  Technique:  Multidetector CT imaging of the abdomen and pelvis was performed following the standard protocol during bolus administration of intravenous contrast.  Contrast: OMNIPAQUE IOHEXOL 300 MG/ML  SOLN  Comparison: 04/25/2013  Findings: Minor streaky bibasilar atelectasis.  Lung bases otherwise clear.  Normal heart size.  No pericardial or pleural effusion.  Abdomen:  Prior cholecystectomy evident.  Liver, biliary system, pancreas, spleen, accessory splenule, adrenal glands, and kidneys are within normal limits for  age and demonstrate no acute process.  Anterior percutaneous drain is in good position.  There is near complete resolution of the previous anterior abdominal abscess. Small amount of fluid and air noted around the catheter retention loop.  No new abdominal fluid collection appreciated.  Colostomy noted in the left abdomen.  Nonspecific diffuse fluid distention of the small bowel.  No definite obstruction pattern.  Previous right lower quadrant surgical drain has been removed.  Along the percutaneous tract of this removed drain there is a small amount of residual fluid and air within the soft tissues, image 58 and 59.  Pelvis:  Postop changes in the right lower quadrant where radiodense suture lines are noted.  Diffuse edema and soft tissue thickening noted throughout the peritoneal cavity with associated mesenteric edema extending into the pelvis.  Chronic bladder wall thickening.  Uterus is not visualized.  Stable soft tissue prominence of the presacral space compatible with prior radiation. No significant interval change in the pelvis.  No inguinal abnormality or hernia.  Within the left upper pelvis,  there is a residual small air fluid collection measuring 3.8 x 2.0 cm, image 67.  This correlates with a small interloop abscess close to where the previous pelvic surgical drain terminated.  Minor degenerative changes of the lumbar spine and facet joints.  IMPRESSION: Nearly resolved intra-abdominal abscess following drain insertion. Trace amount of air and fluid about the drain catheter site noted.  Stable small air fluid collection in the left hemi pelvis as before  Stable fluid distention of small bowel without definite obstruction.  Stable postoperative findings and chronic changes in the abdomen and pelvis  No new abscess or fluid collection within the abdomen or pelvis.   Original Report Authenticated By: Judie Petit. Miles Costain, M.D.    Anti-infectives: Anti-infectives   Start     Dose/Rate Route Frequency Ordered  Stop   04/25/13 0800  levofloxacin (LEVAQUIN) IVPB 500 mg     500 mg 100 mL/hr over 60 Minutes Intravenous Every 24 hours 04/25/13 0655     04/16/13 2200  clindamycin (CLEOCIN) IVPB 900 mg     900 mg 100 mL/hr over 30 Minutes Intravenous 3 times per day 04/16/13 1807 04/16/13 2218   04/16/13 0922  clindamycin (CLEOCIN) 600 MG/50ML IVPB  Status:  Discontinued    Comments:  GALLMAN, KATHIE JEAN: cabinet override      04/16/13 0922 04/16/13 0925   04/16/13 0600  clindamycin (CLEOCIN) IVPB 900 mg     900 mg 100 mL/hr over 30 Minutes Intravenous On call to O.R. 04/15/13 1433 04/16/13 1227   04/16/13 0600  gentamicin (GARAMYCIN) 280.4 mg in dextrose 5 % 100 mL IVPB     5 mg/kg  56.1 kg 107 mL/hr over 60 Minutes Intravenous On call to O.R. 04/15/13 1433 04/16/13 1221      Assessment/Plan: s/p Procedure(s): COLOSTOMY REVISION, REPAIR COLOSTOMY FISTULA EXPLORATORY LAPAROTOMY,Partial Colectomy,Small bowel resection APPENDECTOMY Repeat CT of the abdomen with oral contrast. Look for calorie count findings.  LOS: 18 days    Marta Lamas. [name], MD, FACS [phone] Surgery 05/04/2013   Reviewed calorie count and the patient is still not eating enough to stop TPN.  Her prealbumin is up to 21.

## 2013-05-04 NOTE — Progress Notes (Signed)
PARENTERAL NUTRITION CONSULT NOTE  Pharmacy Consult for TPN Indication:  colocutaneous and enterocolonic fistula  Patient Measurements: Height: 5\' 4"  (162.6 cm) Weight: 130 lb 4.7 oz (59.1 kg) IBW/kg (Calculated) : 54.7  Vital Signs: Temp: 98.4 F (36.9 C) (08/12 0655) Temp src: Oral (08/11 2234) BP: 94/56 mmHg (08/12 0655) Pulse Rate: 77 (08/12 0655) Intake/Output from previous day: 08/11 0701 - 08/12 0700 In: 8945.4 [P.O.:840; I.V.:790; IV Piggyback:100; TPN:7215.4] Out: 325 [Drains:25; Stool:300] Intake/Output from this shift:    Labs:  Recent Labs  05/03/13 0500  WBC 13.3*  HGB 9.3*  HCT 28.2*  PLT 702*     Recent Labs  05/03/13 0500  NA 132*  K 4.7  CL 98  CO2 25  GLUCOSE 95  BUN 19  CREATININE 0.65  CALCIUM 11.2*  MG 2.0  PHOS 3.3  PROT 7.6  ALBUMIN 2.6*  AST 36  ALT 37*  ALKPHOS 111  BILITOT 0.2*  PREALBUMIN 21.8  TRIG 67   Estimated Creatinine Clearance: 83.1 ml/min (by C-G formula based on Cr of 0.65).   No results found for this basename: GLUCAP,  in the last 72 hours  Current Nutrition:  Cyclic TPN 1920 mls over 14 hours + lipids 17.8 ml/hr over 14 hours provides 1862 kcals and 96 gms of protein (meets 100% estimated needs) + regular diet  Nutritional Goals:  1700-2000 kCal, 80-95 grams of protein per day per RD recommendations 8/11  Assessment:  Patient known to pharmacy from prior TPN consults. (She was on TPN per [facility] at home previously, but per [facility] she has been off TPN for about 1 month PTA). 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  Underwent the following procedures on 7/25:  COLOSTOMY REVISION  REPAIR COLOSTOMY FISTULA  EXPLORATORY LAPAROTOMY  PARTIAL COLECTOMY  APPENDECTOMY  Patient s/p CT guided abscess drain placement 8/3.  GI: Prealb greatly improved to 21.8 as of 8/11. Pt on regular diet and calorie count completed by nutrition over the weekend.  However pt taking insufficient amount of PO to stop TPN at this time (only taking an average of 14% kcal needs and 8% protein needs).  CT of abd reported with nearly resolved intra-abdominal abscess  Endo: No hx - AM glucose WNL as of 8/11, has not required insulin in her TPN in the past  Lytes: Na 132, K 4.7, Mg + Phos WNL - corr Ca high at 12.3 (Ca x phos = ~41) as of 8/11. Noted pt has accumulated lytes in the past and has required lyte free formula. Will monitor  Renal: SCr stable, UOP not documented  Cards: cardiomyopathy (EF 5/10 20%, BNP 1234 5/28) - BP 94/56, HR 77 - coreg, lasix, bidil, lisinopril, spironolactone - lifevest, trigs 67   Hepatobil: ALT bumped up slightly, other LFTs WNL  ID:  Levaquin Day #12 for UTI/Ecoli in peritoneal cavity. WBC mildly elevated as of 8/11, afebrile  8/3 Urine cx>> 60kcol multiple bacteria, none predominant 8/3 abscess cx (peritoneal cavity)>> Ecoli (pan sensitive)  levaquin 8/3 >>  Best Practices: warfarin (DVT 5/22)  Dr. [name] does NOT plan to resume warf 2nd no DVT 7/29 doppler - CBC stable, lovenox for VTE proph  TPN Access: PICC   TPN day#:  (started 04/19/13 then stopped 8/2 am and restarted 8/4 PM)   Plan:  - Continue Clinimix E 5/15 with a 14 hr cycle at 73mL/hr x1 hour, then 164mL/hr over 12 hours, then 38mL/hr x1 hour  for a total of over 14 hours  - Continue cyclic lipids at 17.8 ml/hr over 14 hours - F/u AM BMET, Mg & Phos to ensure lytes are not accumulating - MD - Please clarify intended LOT of levaquin - F/u PO intake  [name], PharmD, BCPS Pager # [phone] 05/04/2013 8:15 AM

## 2013-05-04 NOTE — Progress Notes (Signed)
Pt refused po Dilaudid when brought this to her room instead of IV Dilaudid. Explained to pt that the po dose is twice the IV dose. She states "It does not help me". Pt and writer had agreed at beginning of shift to use po medications q 4 hours and supplement pain management with IV Di lauded for [facility].

## 2013-05-05 LAB — BASIC METABOLIC PANEL
CO2: 23 mEq/L (ref 19–32)
Calcium: 11.6 mg/dL — ABNORMAL HIGH (ref 8.4–10.5)
Potassium: 4.6 mEq/L (ref 3.5–5.1)
Sodium: 131 mEq/L — ABNORMAL LOW (ref 135–145)

## 2013-05-05 LAB — PHOSPHORUS: Phosphorus: 3.6 mg/dL (ref 2.3–4.6)

## 2013-05-05 LAB — MAGNESIUM: Magnesium: 1.8 mg/dL (ref 1.5–2.5)

## 2013-05-05 MED ORDER — HYDROMORPHONE HCL 2 MG PO TABS
2.0000 mg | ORAL_TABLET | ORAL | Status: DC | PRN
  Administered 2013-05-05 – 2013-05-09 (×17): 4 mg via ORAL
  Filled 2013-05-05 (×17): qty 2

## 2013-05-05 MED ORDER — TRACE MINERALS CR-CU-F-FE-I-MN-MO-SE-ZN IV SOLN
INTRAVENOUS | Status: DC
  Filled 2013-05-05: qty 2000

## 2013-05-05 MED ORDER — FAT EMULSION 20 % IV EMUL
250.0000 mL | INTRAVENOUS | Status: DC
  Filled 2013-05-05: qty 250

## 2013-05-05 NOTE — Progress Notes (Addendum)
GS Progress Note Subjective: Patient not eating well because she states that all the food she eats tastes salty.  She states that is what happens when she is on the TPN.  I will stop the TPN.  Also has only been taking IV narcotics for pain control.  Most of her pain is around her drain site.  Should not be so severe that it cannot be controlled with PO pain medications.  Objective: Vital signs in last 24 hours: Temp:  [97.5 F (36.4 C)-99.1 F (37.3 C)] 98.3 F (36.8 C) (08/13 1420) Pulse Rate:  [78-94] 78 (08/13 1420) Resp:  [15-21] 17 (08/13 1420) BP: (79-103)/(42-64) 86/45 mmHg (08/13 1420) SpO2:  [100 %] 100 % (08/13 1420) Last BM Date: 05/05/13  Intake/Output from previous day: 08/12 0701 - 08/13 0700 In: 2579.5 [P.O.:780; I.V.:177; TPN:1617.5] Out: 565 [Drains:65; Stool:500] Intake/Output this shift: Total I/O In: 1142 [I.V.:258; TPN:884] Out: 200 [Stool:200]  Lungs: Cl;ear to auscultation  Abd: Soft, minimally tender, good bowel sounds.  Colostomy working well without prolapse.  Extremities: No changes  Neuro: Intact  Lab Results: CBC   Recent Labs  05/03/13 0500  WBC 13.3*  HGB 9.3*  HCT 28.2*  PLT 702*   BMET  Recent Labs  05/03/13 0500 05/05/13 0510  NA 132* 131*  K 4.7 4.6  CL 98 98  CO2 25 23  GLUCOSE 95 91  BUN 19 27*  CREATININE 0.65 0.76  CALCIUM 11.2* 11.6*   PT/INR No results found for this basename: LABPROT, INR,  in the last 72 hours ABG No results found for this basename: PHART, PCO2, PO2, HCO3,  in the last 72 hours  Studies/Results: Ct Abdomen Pelvis Wo Contrast  05/04/2013   *RADIOLOGY REPORT*  Clinical Data: Abscess  CT ABDOMEN AND PELVIS WITHOUT CONTRAST  Technique:  Multidetector CT imaging of the abdomen and pelvis was performed following the standard protocol without intravenous contrast.  Comparison: 05/03/2013  Findings: Lung bases, liver, spleen, pancreas, adrenal glands are within normal limits.  Post cholecystectomy.   Stable appearance of the kidneys.  Tiny calculus in the lower pole of the left kidney.  The right sided abdominal abscess drain is stable in position within the anterior peritoneal space.  No residual abscess.  Small amount of gas adjacent to the emboli this is stable.  Left pelvic gas and fluid-filled abscess measuring 2.2 x 4.5 cm is not significantly changed.  There is no enteric contrast within this abscess cavity.  Bladder is distended.  Perirectal edema within the presacral fat is stable.  No disproportionate dilatation of small bowel to suggest obstruction.  Previously noted small bowel distention has subsided.  IMPRESSION: There is no evidence of enteric contrast within the left low pelvic abscess.  Small bowel distention has improved.   Original Report Authenticated By: Jolaine Click, M.D.    Anti-infectives: Anti-infectives   Start     Dose/Rate Route Frequency Ordered Stop   04/25/13 0800  levofloxacin (LEVAQUIN) IVPB 500 mg     500 mg 100 mL/hr over 60 Minutes Intravenous Every 24 hours 04/25/13 0655     04/16/13 2200  clindamycin (CLEOCIN) IVPB 900 mg     900 mg 100 mL/hr over 30 Minutes Intravenous 3 times per day 04/16/13 1807 04/16/13 2218   04/16/13 0922  clindamycin (CLEOCIN) 600 MG/50ML IVPB  Status:  Discontinued    Comments:  GALLMAN, KATHIE JEAN: cabinet override      04/16/13 0922 04/16/13 0925   04/16/13 0600  clindamycin (  CLEOCIN) IVPB 900 mg     900 mg 100 mL/hr over 30 Minutes Intravenous On call to O.R. 04/15/13 1433 04/16/13 1227   04/16/13 0600  gentamicin (GARAMYCIN) 280.4 mg in dextrose 5 % 100 mL IVPB     5 mg/kg  56.1 kg 107 mL/hr over 60 Minutes Intravenous On call to O.R. 04/15/13 1433 04/16/13 1221      Assessment/Plan: s/p Procedure(s): COLOSTOMY REVISION, REPAIR COLOSTOMY FISTULA EXPLORATORY LAPAROTOMY,Partial Colectomy,Small bowel resection APPENDECTOMY Advance diet Stop TPN. Stop IV narcotics.  Will consider IV toradol. See if PO intake improves  off of the TPN.  LOS: 19 days    Marta Lamas. [name], MD, FACS [phone] [facility] 05/05/2013  Patient has hypercalcemia of unknown etiology.  Will get PTH level and recheck Ca2+

## 2013-05-05 NOTE — Progress Notes (Signed)
PARENTERAL NUTRITION & ANTIBIOTIC CONSULT NOTE  Pharmacy Consult for TPN & Levaquin Indication:  colocutaneous and enterocolonic fistula; UTI/Ecoli in peritoneal cavity  Patient Measurements: Height: 5\' 4"  (162.6 cm) Weight: 130 lb 4.7 oz (59.1 kg) IBW/kg (Calculated) : 54.7  Vital Signs: Temp: 99.1 F (37.3 C) (08/13 0554) Temp src: Oral (08/13 0554) BP: 90/44 mmHg (08/13 0718) Pulse Rate: 80 (08/13 0629) Intake/Output from previous day: 08/12 0701 - 08/13 0700 In: 2579.5 [P.O.:780; I.V.:177; TPN:1617.5] Out: 565 [Drains:65; Stool:500] Intake/Output from this shift:    Labs:  Recent Labs  05/03/13 0500  WBC 13.3*  HGB 9.3*  HCT 28.2*  PLT 702*     Recent Labs  05/03/13 0500 05/05/13 0510  NA 132* 131*  K 4.7 4.6  CL 98 98  CO2 25 23  GLUCOSE 95 91  BUN 19 27*  CREATININE 0.65 0.76  CALCIUM 11.2* 11.6*  MG 2.0 1.8  PHOS 3.3 3.6  PROT 7.6  --   ALBUMIN 2.6*  --   AST 36  --   ALT 37*  --   ALKPHOS 111  --   BILITOT 0.2*  --   PREALBUMIN 21.8  --   TRIG 67  --    Estimated Creatinine Clearance: 83.1 ml/min (by C-G formula based on Cr of 0.76).   No results found for this basename: GLUCAP,  in the last 72 hours  Current Nutrition:  Cyclic TPN 1920 mls over 14 hours + lipids 17.8 ml/hr over 14 hours provides 1862 kcals and 96 gms of protein (meets 100% estimated needs) + regular diet  Nutritional Goals:  1700-2000 kCal, 80-95 grams of protein per day per RD recommendations 8/11  Assessment:  Patient known to pharmacy from prior TPN consults. (She was on TPN per [facility] at home previously, but per [facility] she has been off TPN for about 1 month PTA). 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  Underwent the following procedures on 7/25:  COLOSTOMY REVISION  REPAIR COLOSTOMY FISTULA  EXPLORATORY LAPAROTOMY  PARTIAL COLECTOMY  APPENDECTOMY  Patient s/p CT guided abscess drain placement  8/3.  GI: Prealb greatly improved to 21.8 as of 8/11. Pt on regular diet and calorie count completed by nutrition over the weekend. However pt taking insufficient amount of PO to stop TPN at this time (only taking an average of 14% kcal needs and 8% protein needs). CT of abd reported with nearly resolved intra-abdominal abscess  Endo: No hx - AM glucose WNL, has not required insulin in her TPN in the past  Lytes: Na 131, K 4.6, Mg + Phos WNL - corr Ca high at 12.72 (Ca x phos = ~46) as of 8/11. Noted pt has accumulated lytes in the past and has required lyte free formula. Will monitor  Renal: SCr stable, UOP not documented  Cards: cardiomyopathy (EF 5/10 20%, BNP 1234 5/28) - BP 90/44, HR 80 - lasix, bidil, lisinopril, spironolactone - lifevest, trigs 67   Hepatobil: ALT bumped up slightly, other LFTs WNL as of 8/11  ID:  Levaquin Day #13 for UTI/Ecoli in peritoneal cavity. WBC mildly elevated as of 8/11, afebrile  8/3 Urine cx>> 60kcol multiple bacteria, none predominant 8/3 abscess cx (peritoneal cavity)>> Ecoli (pan sensitive)  levaquin 8/3 >>  Best Practices: warfarin (DVT 5/22)  Dr. [name] does NOT plan to resume warf 2nd no DVT 7/29 doppler - CBC stable, lovenox for VTE proph  TPN Access: PICC  TPN day#:  (started 04/19/13 then stopped 8/2 am and restarted 8/4 PM)   Plan:  - Continue Clinimix E 5/15 with a 14 hr cycle at 37mL/hr x1 hour, then 112mL/hr over 12 hours, then 60mL/hr x1 hour for a total of over 14 hours  - Continue cyclic lipids at 17.8 ml/hr over 14 hours - F/u AM labs and monitor for electrolyte accumulation - Levaquin 500mg  Q24H - MD please consider DC or clarify intended LOT - F/u PO intake  [name], PharmD, BCPS Pager # [phone] 05/05/2013 8:15 AM

## 2013-05-05 NOTE — Progress Notes (Signed)
IV team was notified about the TNA rate, will adjust time in am for the TNA and lipids to be discontinued.

## 2013-05-06 LAB — CBC WITH DIFFERENTIAL/PLATELET
Basophils Absolute: 0.1 10*3/uL (ref 0.0–0.1)
Basophils Relative: 1 % (ref 0–1)
Hemoglobin: 8.7 g/dL — ABNORMAL LOW (ref 12.0–15.0)
MCHC: 31.4 g/dL (ref 30.0–36.0)
Monocytes Relative: 10 % (ref 3–12)
Neutro Abs: 6.4 10*3/uL (ref 1.7–7.7)
Neutrophils Relative %: 68 % (ref 43–77)
RDW: 17.7 % — ABNORMAL HIGH (ref 11.5–15.5)

## 2013-05-06 LAB — COMPREHENSIVE METABOLIC PANEL
ALT: 60 U/L — ABNORMAL HIGH (ref 0–35)
AST: 43 U/L — ABNORMAL HIGH (ref 0–37)
Albumin: 2.8 g/dL — ABNORMAL LOW (ref 3.5–5.2)
Calcium: 12.2 mg/dL — ABNORMAL HIGH (ref 8.4–10.5)
Sodium: 130 mEq/L — ABNORMAL LOW (ref 135–145)
Total Protein: 8.1 g/dL (ref 6.0–8.3)

## 2013-05-06 MED ORDER — ZOLPIDEM TARTRATE 5 MG PO TABS
5.0000 mg | ORAL_TABLET | Freq: Every evening | ORAL | Status: DC | PRN
  Administered 2013-05-06 – 2013-05-08 (×3): 5 mg via ORAL
  Filled 2013-05-06 (×3): qty 1

## 2013-05-06 MED ORDER — ZOLPIDEM TARTRATE 5 MG PO TABS: 10.0000 mg | ORAL_TABLET | Freq: Every evening | ORAL | Status: DC | PRN

## 2013-05-06 MED ORDER — ONDANSETRON HCL 4 MG PO TABS
4.0000 mg | ORAL_TABLET | Freq: Four times a day (QID) | ORAL | Status: DC
  Administered 2013-05-06 – 2013-05-09 (×12): 4 mg via ORAL
  Filled 2013-05-06 (×22): qty 1

## 2013-05-06 NOTE — Progress Notes (Signed)
Patient ID: Vanessa [name], female   DOB: 1975-10-24, 37 y.o.   MRN: [id number]   LOS: 20 days   Subjective: Feels like she's done ok off of the TPN. Oral dilaudid working for pain though takes longer to kick in.   Objective: Vital signs in last 24 hours: Temp:  [98.3 F (36.8 C)-98.4 F (36.9 C)] 98.4 F (36.9 C) (08/14 0547) Pulse Rate:  [78-87] 86 (08/14 0547) Resp:  [16-17] 16 (08/14 0547) BP: (84-86)/(45-58) 85/58 mmHg (08/14 0547) SpO2:  [100 %] 100 % (08/14 0547) Weight:  [122 lb 8 oz (55.566 kg)] 122 lb 8 oz (55.566 kg) (08/14 0648) Last BM Date: 05/05/13   JP: 69ml/24h   Laboratory  CBC  Recent Labs  05/06/13 0500  WBC 9.4  HGB 8.7*  HCT 27.7*  PLT 591*   BMET  Recent Labs  05/05/13 0510 05/06/13 0500  NA 131* 130*  K 4.6 4.9  CL 98 96  CO2 23 25  GLUCOSE 91 88  BUN 27* 23  CREATININE 0.76 0.86  CALCIUM 11.6* 12.2*    Physical Exam General appearance: alert and no distress Resp: clear to auscultation bilaterally Cardio: regular rate and rhythm GI: Soft, +BS   Assessment/Plan: S/p colostomy revision EC fistula FEN -- Will get 24h calorie count in advance of possible d/c tomorrow to see if TPN necessary    Freeman Caldron, PA-C Pager: [phone] 05/06/2013

## 2013-05-06 NOTE — Progress Notes (Signed)
NUTRITION FOLLOW UP  Intervention:    Initiate 24-hour calorie count  Patient declined all oral nutrition supplements; encourage intake of meals as able. RD to follow for nutrition care plan  Nutrition Dx:   Altered GI function now related to recent GI surgery AEB variable oral intake.  Goal:   Intake to meet > 90% of estimated nutrition needs.  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   Underwent the following procedures on 7/25:  COLOSTOMY REVISION  REPAIR COLOSTOMY FISTULA  EXPLORATORY LAPAROTOMY  PARTIAL COLECTOMY  APPENDECTOMY   Patient s/p CT guided abscess drain placement 8/3.   Patient told surgeon yesterday that she is not eating well because all of the food that she eats tastes salty and that this is what happens when she is on TPN; Dr. [name] then stopped TPN.  Most recent triglyceride and prealbumin WNL. Sodium trending down, most recent sodium is 130.  RD consulted for 24 hour calorie count to ensure pt is eating enough prior to possible d/c tomorrow (without TPN.)  Discussed initiation of calorie count with RN and patient. Both state that pt refused breakfast this morning 2/2 nausea.  Height: Ht Readings from Last 1 Encounters:  04/18/13 5\' 4"  (1.626 m)    Weight Status:   Wt Readings from Last 1 Encounters:  05/06/13 122 lb 8 oz (55.566 kg)  Wt down 10 lb since admission  Re-estimated needs:  Kcal: 1700-2000 Protein: 80-95 gm Fluid: 1.7-2.0 L  Skin:  LLQ colostomy Abdominal incision  Diet Order: General   Intake/Output Summary (Last 24 hours) at 05/06/13 1019 Last data filed at 05/06/13 0600  Gross per 24 hour  Intake   1452 ml  Output     20 ml  Net   1432 ml    Labs:   Recent Labs Lab 05/03/13 0500 05/05/13 0510 05/06/13 0500  NA 132* 131* 130*  K 4.7 4.6 4.9   CL 98 98 96  CO2 25 23 25   BUN 19 27* 23  CREATININE 0.65 0.76 0.86  CALCIUM 11.2* 11.6* 12.2*  MG 2.0 1.8 1.9  PHOS 3.3 3.6 3.4  GLUCOSE 95 91 88   Prealbumin  Date/Time Value Range Status  05/03/2013  5:00 AM 21.8  17.0 - 34.0 mg/dL Final     Performed at [facility]   Triglycerides  Date/Time Value Range Status  05/03/2013  5:00 AM 67  <150 mg/dL Final     Scheduled Meds: . enoxaparin (LOVENOX) injection  40 mg Subcutaneous Q24H  . furosemide  40 mg Oral Daily  . isosorbide-hydrALAZINE  1 tablet Oral BID  . lisinopril  40 mg Oral Daily  . ondansetron (ZOFRAN) IV  4 mg Intravenous Q6H  . sodium chloride  10-40 mL Intracatheter Q12H  . spironolactone  12.5 mg Oral Daily    Continuous Infusions:    Jarold Motto MS, RD, LDN Pager: [phone] After-hours pager: [phone]

## 2013-05-07 DIAGNOSIS — I5042 Chronic combined systolic (congestive) and diastolic (congestive) heart failure: Secondary | ICD-10-CM

## 2013-05-07 DIAGNOSIS — I959 Hypotension, unspecified: Secondary | ICD-10-CM | POA: Diagnosis not present

## 2013-05-07 DIAGNOSIS — I428 Other cardiomyopathies: Secondary | ICD-10-CM | POA: Diagnosis present

## 2013-05-07 DIAGNOSIS — I369 Nonrheumatic tricuspid valve disorder, unspecified: Secondary | ICD-10-CM

## 2013-05-07 DIAGNOSIS — Z789 Other specified health status: Secondary | ICD-10-CM

## 2013-05-07 LAB — PTH, INTACT AND CALCIUM: Calcium, Total (PTH): 11.9 mg/dL — ABNORMAL HIGH (ref 8.4–10.5)

## 2013-05-07 LAB — PRO B NATRIURETIC PEPTIDE: Pro B Natriuretic peptide (BNP): 34.8 pg/mL (ref 0–125)

## 2013-05-07 MED ORDER — ALTEPLASE 2 MG IJ SOLR
2.0000 mg | Freq: Once | INTRAMUSCULAR | Status: DC
  Filled 2013-05-07: qty 2

## 2013-05-07 MED ORDER — ALTEPLASE 100 MG IV SOLR
2.0000 mg | Freq: Once | INTRAVENOUS | Status: AC
  Administered 2013-05-07: 2 mg
  Filled 2013-05-07: qty 2

## 2013-05-07 MED ORDER — SODIUM CHLORIDE 0.9 % IV SOLN
500.0000 mL | Freq: Once | INTRAVENOUS | Status: AC
  Administered 2013-05-07: 500 mL via INTRAVENOUS

## 2013-05-07 NOTE — Consult Note (Signed)
Reason for Consult: Hypotension and SOB Referring Physician: Dr. Elfredia Nevins  HPI: Vanessa [name] is a 37 y.o. female with a history of cervical cancer, radiation treatment w/ subsequent radiation colitis, continued tobacco abuse since age 7, HTN, eclampsia, anemia, colostomy plus revision and cholecystectomy. Family history is significant for CAD in a grandmother and HTN in motherr. She had a 2D echo on 01/30/13 which revealed and EF of 20%, diffuse hypokinesis, grade two diastolic dysfunction and PA pressure of . Cardiac MRI showed no evidence of coronary disease or anomalies or an obvious etiology for her cardiomyopathy. Subsequently, she has been wearing a LifeVest for primary prevention of sudden cardiac death. 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 [facility] has been consulted to evaluate for persistent hypotension and shortness of breath. The patient's systolic BP dropped into the 70s today. The patient states that she felt short of breath earlier, but this has improved after receiving IVFs. SBP has also improved slightly into the low 80s after a bolus of NS. She denies orthopnea, PND and LEE. No chest pain. She is currently resting comfortably with the use of supplemental O2 via [place].    Past Medical History  Diagnosis Date  . History of blood transfusion     "I had ~ 7 in 01/2012"  . Anemia   . Radiation Nov.18,2011-Jan.23,2012    cervix  . Hypertension     no treatment necessary for BP  since July 2013  . Non-ischemic cardiomyopathy, EF 25 % 02/14/2013  . At risk for sudden cardiac death Feb 24, 2013    hs life vest  . CHF (congestive heart failure)   . Dysrhythmia     WEARS LIFE VEST (DR. C [facility])  . Cervical cancer ~ 2011    cervical    Past Surgical History  Procedure Laterality Date  . Radiation implants  ~ 2011    "for cervical cancer"  . Colonoscopy  01/30/2012   Procedure: COLONOSCOPY;  Surgeon: Beverley Fiedler, MD;  Location[facility] ENDOSCOPY;  Service: Gastroenterology;  Laterality: N/A;  . Colonoscopy  01/30/2012    Procedure: COLONOSCOPY;  Surgeon: Beverley Fiedler, MD;  Location: [facility] OR;  Service: Gastroenterology;  Laterality: N/A;  . Colostomy revision  02/04/2012    Procedure: COLON RESECTION SIGMOID;  Surgeon: Cherylynn Ridges, MD;  Location: [facility] OR;  Service: General;  Laterality: N/A;  . Cholecystectomy  2006  . Laparotomy  08/11/2012    Procedure: EXPLORATORY LAPAROTOMY;  Surgeon: Cherylynn Ridges, MD;  Location: [facility] OR;  Service: General;  Laterality: N/A;  . Laparoscopic lysis of adhesions  08/11/2012    Procedure: LAPAROSCOPIC LYSIS OF ADHESIONS;  Surgeon: Cherylynn Ridges, MD;  Location: [facility] OR;  Service: General;  Laterality: N/A;  . Bowel resection  08/11/2012    Procedure: SMALL BOWEL RESECTION;  Surgeon: Cherylynn Ridges, MD;  Location: [facility] OR;  Service: General;  Laterality: N/A;  . Colostomy  08/11/2012    Procedure: COLOSTOMY;  Surgeon: Cherylynn Ridges, MD;  Location: [facility] OR;  Service: General;  Laterality: N/A;  . Colostomy revision N/A 02/03/2013    Procedure: COLOSTOMY REVISION;  Surgeon: Cherylynn Ridges, MD;  Location: [facility] OR;  Service: General;  Laterality: N/A;  . Colostomy revision N/A 04/16/2013    Procedure: COLOSTOMY REVISION, REPAIR COLOSTOMY FISTULA;  Surgeon: Cherylynn Ridges, MD;  Location: [facility] OR;  Service: General;  Laterality: N/A;  .  Laparotomy N/A 04/16/2013    Procedure: EXPLORATORY LAPAROTOMY,Partial Colectomy,Small bowel resection;  Surgeon: Cherylynn Ridges, MD;  Location: [facility] OR;  Service: General;  Laterality: N/A;  . Appendectomy N/A 04/16/2013    Procedure: APPENDECTOMY;  Surgeon: Cherylynn Ridges, MD;  Location: [facility] OR;  Service: General;  Laterality: N/A;    Family History  Problem Relation Age of Onset  . Hypertension Mother   . Healthy Father   . Healthy Brother   . Healthy Brother     Social History:  reports that she quit smoking about 15  months ago. Her smoking use included Cigarettes. She has a .25 pack-year smoking history. She has never used smokeless tobacco. She reports that  drinks alcohol. She reports that she uses illicit drugs (Marijuana).  Allergies:  Allergies  Allergen Reactions  . Penicillins Other (See Comments)    "anything w/penicillin in it makes me bleed"  . Labetalol Hcl Itching and Other (See Comments)    Bumps to bilateral arms.  . Morphine And Related Hives    Medications:  Prior to Admission medications   Medication Sig Start Date End Date Taking? Authorizing Provider  carvedilol (COREG) 12.5 MG tablet Take 18.75 mg by mouth 2 (two) times daily with a meal.   Yes Historical Provider, MD  isosorbide-hydrALAZINE (BIDIL) 20-37.5 MG per tablet Take 1 tablet by mouth 2 (two) times daily. 02/19/13  Yes Cherylynn Ridges, MD  lisinopril (PRINIVIL,ZESTRIL) 20 MG tablet Take 2 tablets (40 mg total) by mouth daily. 04/06/13  Yes Wilburt Finlay, PA-C  Multiple Vitamin (MULTIVITAMIN WITH MINERALS) TABS Take 1 tablet by mouth daily.   Yes Historical Provider, MD  ondansetron (ZOFRAN) 4 MG tablet Take 1 tablet (4 mg total) by mouth every 6 (six) hours. 03/28/13  Yes Antony Madura, PA-C  oxyCODONE-acetaminophen (PERCOCET) 10-325 MG per tablet Take 1 tablet by mouth every 4 (four) hours as needed for pain.   Yes Historical Provider, MD  potassium chloride (K-DUR) 10 MEQ tablet Take 2 tablets (20 mEq total) by mouth 3 (three) times daily. 04/14/13  Yes Cherylynn Ridges, MD  spironolactone (ALDACTONE) 12.5 mg TABS Take 0.5 tablets (12.5 mg total) by mouth daily. 02/19/13  Yes Cherylynn Ridges, MD  warfarin (COUMADIN) 5 MG tablet Take 1 tablet (5 mg total) by mouth daily. 02/19/13  Yes Cherylynn Ridges, MD  furosemide (LASIX) 40 MG tablet Take 1 tablet (40 mg total) by mouth daily. 02/19/13   Cherylynn Ridges, MD     Results for orders placed during the hospital encounter of 04/16/13 (from the past 48 hour(s))  CBC WITH DIFFERENTIAL     Status:  Abnormal   Collection Time    05/06/13  5:00 AM      Result Value Range   WBC 9.4  4.0 - 10.5 K/uL   RBC 3.41 (*) 3.87 - 5.11 MIL/uL   Hemoglobin 8.7 (*) 12.0 - 15.0 g/dL   HCT 16.1 (*) 09.6 - 04.5 %   MCV 81.2  78.0 - 100.0 fL   MCH 25.5 (*) 26.0 - 34.0 pg   MCHC 31.4  30.0 - 36.0 g/dL   RDW 40.9 (*) 81.1 - 91.4 %   Platelets 591 (*) 150 - 400 K/uL   Neutrophils Relative % 68  43 - 77 %   Neutro Abs 6.4  1.7 - 7.7 K/uL   Lymphocytes Relative 18  12 - 46 %   Lymphs Abs 1.7  0.7 - 4.0 K/uL  Monocytes Relative 10  3 - 12 %   Monocytes Absolute 0.9  0.1 - 1.0 K/uL   Eosinophils Relative 4  0 - 5 %   Eosinophils Absolute 0.4  0.0 - 0.7 K/uL   Basophils Relative 1  0 - 1 %   Basophils Absolute 0.1  0.0 - 0.1 K/uL  COMPREHENSIVE METABOLIC PANEL     Status: Abnormal   Collection Time    05/06/13  5:00 AM      Result Value Range   Sodium 130 (*) 135 - 145 mEq/L   Potassium 4.9  3.5 - 5.1 mEq/L   Chloride 96  96 - 112 mEq/L   CO2 25  19 - 32 mEq/L   Glucose, Bld 88  70 - 99 mg/dL   BUN 23  6 - 23 mg/dL   Creatinine, Ser 9.56  0.50 - 1.10 mg/dL   Calcium 21.3 (*) 8.4 - 10.5 mg/dL   Total Protein 8.1  6.0 - 8.3 g/dL   Albumin 2.8 (*) 3.5 - 5.2 g/dL   AST 43 (*) 0 - 37 U/L   ALT 60 (*) 0 - 35 U/L   Alkaline Phosphatase 138 (*) 39 - 117 U/L   Total Bilirubin 0.3  0.3 - 1.2 mg/dL   GFR calc non Af Amer 85 (*) >90 mL/min   GFR calc Af Amer >90  >90 mL/min   Comment: (NOTE)     The eGFR has been calculated using the CKD EPI equation.     This calculation has not been validated in all clinical situations.     eGFR's persistently <90 mL/min signify possible Chronic Kidney     Disease.  MAGNESIUM     Status: None   Collection Time    05/06/13  5:00 AM      Result Value Range   Magnesium 1.9  1.5 - 2.5 mg/dL  PHOSPHORUS     Status: None   Collection Time    05/06/13  5:00 AM      Result Value Range   Phosphorus 3.4  2.3 - 4.6 mg/dL  PRO B NATRIURETIC PEPTIDE     Status: None    Collection Time    05/07/13  9:30 AM      Result Value Range   Pro B Natriuretic peptide (BNP) 34.8  0 - 125 pg/mL    No results found.  Review of Systems  Respiratory: Positive for shortness of breath.   Cardiovascular: Negative for chest pain, orthopnea, leg swelling and PND.  Neurological: Negative for loss of consciousness.  All other systems reviewed and are negative.   Blood pressure 75/48, pulse 91, temperature 98.1 F (36.7 C), temperature source Oral, resp. rate 20, height 5\' 4"  (1.626 m), weight 122 lb 8 oz (55.566 kg), last menstrual period 04/25/2009, SpO2 100.00%. Physical Exam  Constitutional: She is oriented to person, place, and time. No distress.  Frail appearing, appears older than documented age   Neck: No JVD present. Carotid bruit is not present.  Cardiovascular: Normal rate, regular rhythm, normal heart sounds and intact distal pulses.  Exam reveals no gallop and no friction rub.   No murmur heard. Pulses:      Radial pulses are 2+ on the right side, and 2+ on the left side.       Dorsalis pedis pulses are 2+ on the right side, and 2+ on the left side.  Respiratory: Effort normal and breath sounds normal. No respiratory distress. She has no wheezes.  She has no rales.  Musculoskeletal: She exhibits no edema.  Neurological: She is alert and oriented to person, place, and time.  Skin: Skin is warm and dry. She is not diaphoretic.  Psychiatric: She has a normal mood and affect. Her behavior is normal.    Assessment/Plan: Principal Problem:   S/P left/sigmoid colectomy with anastomotic leak- ileostomy Active Problems:   Entero-colonic fistula   Colostomy prolapse, with revision    Nonischemic cardiomyopathym- EF of 20% via echo 01/2013   Hypotension, unspecified  Plan: Pt with known nonischemic cardiomyopathy (EF of 20%), admitted for surgical repair of colocutaneous and enterocolonic fistula and revision of her prolapsed colostomy, secondary to radiation  induced colitis. This marks POD #21. The patient is hypotensive and was short of breath earlier. She has had mild improvement with BP and symptoms with IVFs. Her BB has been on hold x 3 days. Her most recent BP is 75/48. I have ordered RN to hold both scheduled lisinopril and Bidil until BP improves.  Her last dose of Lisinopril was 24 hours ago and Bidil nearly 12 hours ago. BNP was ordered earlier, however I do not feel SOB is related to CHF, as she does not appear to be volume overloaded (lungs are CTAB, no JVD and no peripheral edema). ? If hypotension is related to anemia. Her Hgb yesterday was 8.7. No CBC ordered today. It appears that her anemia has been chronic with Hgb ranging from ~ 8-11. ? Rechecking H/H today. ? Blood transfusion. Otherwise, see if BP improves with IVFs and reinitiate antihypertensives/HF meds slowly as BP allows. Being that the patient has severe cardiomyopathy, it is important that we resume appropriate HF meds as soon as possible.  MD to follow with further recommendation.    Allayne Butcher, PA-C 05/07/2013, 10:48 AM     I have seen and examined the patient along with Vanessa M. SIMMONS, PA-C.  I have reviewed the chart, notes and new data.  I agree with PA/NP's note.  Key new complaints: no longer dyspneic, lying fully supine Key examination changes: parasternal heave, +ve cardiac gallop, but probably S4, no JVD, no edema, no rales; note weight yesterday was 8 lb less than on 8/10 and than admission weight. She may have lost tru weight due to surgery/prolonger hospital stay, but might be a sign of hypovolemia Key new findings / data: BNP extremely low, mild hyponatremia, chronic anemia  PLAN: I think she is markedly hypovolemic. Agree with holding CHF meds temporarily, but need to restart, hopefully by tomorrow She is going to echo as I am writing this note. Reassess in AM. Approaching time when we need to decide re: AICD (based on LVEF).  Thurmon Fair,  MD, [facility] [phone] 05/07/2013, 3:12 PM

## 2013-05-07 NOTE — Progress Notes (Signed)
UR completed 

## 2013-05-07 NOTE — Progress Notes (Signed)
Trying to encourage PO intake.  May have to go back on TPN.

## 2013-05-07 NOTE — Progress Notes (Signed)
3086 Nurse tech stated that patient's b/p was 72/42. 0625 Patient's b/p is 75/48. Patient states she is feeling weak and sob. Pulse was 91 and O2 sat 100% on RA. O2 started at 2l/m nasal cannula. 5784 Dr. Donell Beers notified of low b/p and she stated she would talk to Dr. [name] this am. Patient states she still feel the same  B/p 79/49 hr 89 and O2 sat 100 on2l/m nasal cannula.

## 2013-05-07 NOTE — Progress Notes (Signed)
Calorie Count Note  Intervention:  Recommend resumption of TPN. If pt to remain inpatient over the weekend, recommend calorie count over the weekend, RD to review results on Monday to determine need for TPN. Unable to determine at this time total intake given lack of documentation.  Assessment: Unable to determine intake in the past 24 hours. Pt refused breakfast yesterday and no intake was recorded for lunch or dinner. Pt reports that she ate half of her lunch and an entire roast beef sandwich for dinner yesterday however I am unable to verify this with nursing staff. She states that she is not hungry for breakfast this morning, no tray has been delivered as of this time.  Diet: Regular Supplements: none  Breakfast: refused Lunch: nothing documented Dinner: nothing documented Supplements: none  Nutrition Dx: Altered GI function related to recent GI surgery AEB variable oral intake. Ongoing.  Goal: Intake to meet > 90% of estimated nutrition needs. Unmet.  Monitor: PO intake, weight, labs, I/O's  Jarold Motto MS, RD, LDN Pager: [phone] After-hours pager: [phone]

## 2013-05-07 NOTE — Progress Notes (Signed)
  Echocardiogram 2D Echocardiogram has been performed.  Vanessa [name] 05/07/2013, 4:03 PM

## 2013-05-07 NOTE — Progress Notes (Signed)
GS Progress Note Subjective: Patient feeling weak and short of breath this AM.  BP has been low.  Stopped Coreg 3 days ago, BP still low.  Objective: Vital signs in last 24 hours: Temp:  [98.1 F (36.7 C)-98.6 F (37 C)] 98.1 F (36.7 C) (08/15 0500) Pulse Rate:  [80-97] 91 (08/15 0625) Resp:  [20] 20 (08/15 0500) BP: (72-93)/(42-63) 75/48 mmHg (08/15 0625) SpO2:  [98 %-100 %] 100 % (08/15 0625) Last BM Date: 05/06/13  Intake/Output from previous day: 08/14 0701 - 08/15 0700 In: 135 [P.O.:120] Out: 210 [Drains:10; Stool:200] Intake/Output this shift:    Lungs: Clear to auscultation.  Oxygen saturations are excellent.    Abd: Soft, good bowel sounds.  Nontender.  Ostomy is working well.  Diet seems to be improving.    Extremities: The same, no clinical signs of DVT  Neuro: Intact.    Lab Results: CBC   Recent Labs  05/06/13 0500  WBC 9.4  HGB 8.7*  HCT 27.7*  PLT 591*   BMET  Recent Labs  05/05/13 0510 05/06/13 0500  NA 131* 130*  K 4.6 4.9  CL 98 96  CO2 23 25  GLUCOSE 91 88  BUN 27* 23  CREATININE 0.76 0.86  CALCIUM 11.6* 12.2*   PT/INR No results found for this basename: LABPROT, INR,  in the last 72 hours ABG No results found for this basename: PHART, PCO2, PO2, HCO3,  in the last 72 hours  Studies/Results: No results found.  Anti-infectives: Anti-infectives   Start     Dose/Rate Route Frequency Ordered Stop   04/25/13 0800  levofloxacin (LEVAQUIN) IVPB 500 mg  Status:  Discontinued     500 mg 100 mL/hr over 60 Minutes Intravenous Every 24 hours 04/25/13 0655 05/05/13 1744   04/16/13 2200  clindamycin (CLEOCIN) IVPB 900 mg     900 mg 100 mL/hr over 30 Minutes Intravenous 3 times per day 04/16/13 1807 04/16/13 2218   04/16/13 0922  clindamycin (CLEOCIN) 600 MG/50ML IVPB  Status:  Discontinued    Comments:  GALLMAN, KATHIE JEAN: cabinet override      04/16/13 0922 04/16/13 0925   04/16/13 0600  clindamycin (CLEOCIN) IVPB 900 mg     900  mg 100 mL/hr over 30 Minutes Intravenous On call to O.R. 04/15/13 1433 04/16/13 1227   04/16/13 0600  gentamicin (GARAMYCIN) 280.4 mg in dextrose 5 % 100 mL IVPB     5 mg/kg  56.1 kg 107 mL/hr over 60 Minutes Intravenous On call to O.R. 04/15/13 1433 04/16/13 1221      Assessment/Plan: s/p Procedure(s): COLOSTOMY REVISION, REPAIR COLOSTOMY FISTULA EXPLORATORY LAPAROTOMY,Partial Colectomy,Small bowel resection APPENDECTOMY Advance diet Bolus with NS. Hypercalcemia persists.  PTH ordered previously is not back yet.  Renal function is good.  Will bolus with NS 500cc now, get Cardiology consultation, order echocardiogram.  LOS: 21 days    Marta Lamas. [name], MD, FACS [phone] [facility] 05/07/2013

## 2013-05-08 MED ORDER — LISINOPRIL 10 MG PO TABS
10.0000 mg | ORAL_TABLET | Freq: Every day | ORAL | Status: DC
  Administered 2013-05-08: 10 mg via ORAL
  Filled 2013-05-08 (×2): qty 1

## 2013-05-08 MED ORDER — SODIUM CHLORIDE 0.9 % IV SOLN
INTRAVENOUS | Status: AC
  Administered 2013-05-08: 13:00:00 via INTRAVENOUS

## 2013-05-08 NOTE — Progress Notes (Signed)
GS Progress Note Subjective: Patient wants desperately to go home.  Not able to document clearly that the patient has eaten well enough.  Still intermittently hypotensive.  Appreciate Cardiology service notes.  Objective: Vital signs in last 24 hours: Temp:  [97.9 F (36.6 C)-98.3 F (36.8 C)] 98.3 F (36.8 C) (08/16 0515) Pulse Rate:  [71-77] 75 (08/16 0515) Resp:  [16-20] 18 (08/16 0515) BP: (82-98)/(54-60) 93/57 mmHg (08/16 0515) SpO2:  [100 %] 100 % (08/16 0515) Last BM Date: 05/07/13  Intake/Output from previous day: 08/15 0701 - 08/16 0700 In: 1390 [P.O.:720; I.V.:660] Out: 10 [Drains:10] Intake/Output this shift:    Lungs: Clear  Abd: soft, benign.  Still draining a small amout of enteric fluid from perc drain  Extremities: Nochanges  Neuro: Intact  Lab Results: CBC   Recent Labs  05/06/13 0500  WBC 9.4  HGB 8.7*  HCT 27.7*  PLT 591*   BMET  Recent Labs  05/06/13 0500  NA 130*  K 4.9  CL 96  CO2 25  GLUCOSE 88  BUN 23  CREATININE 0.86  CALCIUM 12.2*  11.9*   PT/INR No results found for this basename: LABPROT, INR,  in the last 72 hours ABG No results found for this basename: PHART, PCO2, PO2, HCO3,  in the last 72 hours  Studies/Results: No results found.  Anti-infectives: Anti-infectives   Start     Dose/Rate Route Frequency Ordered Stop   04/25/13 0800  levofloxacin (LEVAQUIN) IVPB 500 mg  Status:  Discontinued     500 mg 100 mL/hr over 60 Minutes Intravenous Every 24 hours 04/25/13 0655 05/05/13 1744   04/16/13 2200  clindamycin (CLEOCIN) IVPB 900 mg     900 mg 100 mL/hr over 30 Minutes Intravenous 3 times per day 04/16/13 1807 04/16/13 2218   04/16/13 0922  clindamycin (CLEOCIN) 600 MG/50ML IVPB  Status:  Discontinued    Comments:  GALLMAN, KATHIE JEAN: cabinet override      04/16/13 0922 04/16/13 0925   04/16/13 0600  clindamycin (CLEOCIN) IVPB 900 mg     900 mg 100 mL/hr over 30 Minutes Intravenous On call to O.R. 04/15/13  1433 04/16/13 1227   04/16/13 0600  gentamicin (GARAMYCIN) 280.4 mg in dextrose 5 % 100 mL IVPB     5 mg/kg  56.1 kg 107 mL/hr over 60 Minutes Intravenous On call to O.R. 04/15/13 1433 04/16/13 1221      Assessment/Plan: s/p Procedure(s): COLOSTOMY REVISION, REPAIR COLOSTOMY FISTULA EXPLORATORY LAPAROTOMY,Partial Colectomy,Small bowel resection APPENDECTOMY Need for the patient's BP to stabilize before she can go home.  Will remove PICC for her to go home.  this is risky since her PO intake seems marginal.  LOS: 22 days    Marta Lamas. [name], MD, FACS [phone] [facility] Surgery 05/08/2013

## 2013-05-08 NOTE — Progress Notes (Signed)
Subjective:  Awake and alert  Objective:  Vital Signs in the last 24 hours: Temp:  [97.9 F (36.6 C)-98.3 F (36.8 C)] 98.3 F (36.8 C) (08/16 0515) Pulse Rate:  [71-77] 75 (08/16 0515) Resp:  [16-20] 18 (08/16 0515) BP: (82-98)/(54-60) 93/57 mmHg (08/16 0515) SpO2:  [100 %] 100 % (08/16 0515)  Intake/Output from previous day:  Intake/Output Summary (Last 24 hours) at 05/08/13 0958 Last data filed at 05/07/13 2119  Gross per 24 hour  Intake    885 ml  Output     10 ml  Net    875 ml    Physical Exam: General appearance: alert, cooperative, cachectic and no distress Lungs: clear to auscultation bilaterally Heart: regular rate and rhythm   Rate: 75  Rhythm: indeterminate  Lab Results:  Recent Labs  05/06/13 0500  WBC 9.4  HGB 8.7*  PLT 591*    Recent Labs  05/06/13 0500  NA 130*  K 4.9  CL 96  CO2 25  GLUCOSE 88  BUN 23  CREATININE 0.86   No results found for this basename: TROPONINI, CK, MB,  in the last 72 hours Hepatic Function Panel  Recent Labs  05/06/13 0500  PROT 8.1  ALBUMIN 2.8*  AST 43*  ALT 60*  ALKPHOS 138*  BILITOT 0.3   No results found for this basename: CHOL,  in the last 72 hours No results found for this basename: INR,  in the last 72 hours  Imaging: Imaging results have been reviewed  Cardiac Studies:  - Left ventricle: The cavity size was mildly dilated. Systolic function was moderately reduced. The estimated ejection fraction was in the range of 35% to 40%. Diffuse hypokinesis. - Right ventricle: Systolic pressure was increased. Impressions:   Assessment/Plan:   Principal Problem:   Hypotension, unspecified Active Problems:   Radiation colitis   S/P left/sigmoid colectomy with anastomotic leak- ileostomy   NICM EF of 20% by echo 01/2013-improved 35-40% 05/07/13   Entero-colonic fistula   Colostomy prolapse, with revision     PLAN: LVF improved to 35-40%. She is on Lisinopril 40mg  daily and Bidil BID with a  systolic B/P in the 90s.  I have decreased Lisinopril to 10mg , not sure she needs, or her B/P will tolerate, both an ACE and BiDil.  Consider starting low dose beta blocker. Avoid resuming Aldactone at this time as her K+ is 4.9. Dr Royann Shivers to see.  Corine Shelter PA-C Beeper [phone] 05/08/2013, 9:58 AM   I have seen and examined the patient along with Corine Shelter PA-C.  I have reviewed the chart, notes and new data.  I agree with PA's note.  Key new complaints: no dyspnea Key examination changes: still hypotensive; has not been weighed in last 2 days Key new findings / data: improved LVEF and no signs of increased LA pressure by Doppler.   PLAN: Suspect still relatively hypovolemic. Would not start beta blockers yet. Agree with holding aldactone. No diuretics. Her weight today was only 49 kg (11 kg less than admission - not sure this is accurate). Life Vest will not be necessary at discharge. Will DC. Does not need AICD.   Thurmon Fair, MD, [facility] [phone] 05/08/2013, 11:36 AM

## 2013-05-08 NOTE — Progress Notes (Signed)
All bp meds held today and NS fluid bolus given, but BP still low. Appetite improved with good p.o. Intake.Irena Reichmann

## 2013-05-09 LAB — CBC WITH DIFFERENTIAL/PLATELET
Basophils Absolute: 0.1 10*3/uL (ref 0.0–0.1)
Basophils Relative: 1 % (ref 0–1)
Eosinophils Absolute: 0.3 10*3/uL (ref 0.0–0.7)
MCH: 25.7 pg — ABNORMAL LOW (ref 26.0–34.0)
MCHC: 31.7 g/dL (ref 30.0–36.0)
Neutro Abs: 5.8 10*3/uL (ref 1.7–7.7)
Neutrophils Relative %: 68 % (ref 43–77)
RDW: 17.5 % — ABNORMAL HIGH (ref 11.5–15.5)

## 2013-05-09 LAB — BASIC METABOLIC PANEL
BUN: 18 mg/dL (ref 6–23)
Chloride: 102 mEq/L (ref 96–112)
Creatinine, Ser: 0.75 mg/dL (ref 0.50–1.10)
GFR calc Af Amer: >90 mL/min (ref >90)
GFR calc non Af Amer: >90 mL/min (ref >90)
Potassium: 4.2 mEq/L (ref 3.5–5.1)

## 2013-05-09 MED ORDER — ONDANSETRON HCL 4 MG PO TABS: 4.0000 mg | ORAL_TABLET | Freq: Four times a day (QID) | ORAL | Status: AC

## 2013-05-09 MED ORDER — OXYCODONE-ACETAMINOPHEN 10-325 MG PO TABS: 1.0000 | ORAL_TABLET | ORAL | Status: DC | PRN

## 2013-05-09 NOTE — Progress Notes (Signed)
Pt given discharge instructions and prescription. Questions answered. Discharged via w/c with family. Barbera Setters

## 2013-05-09 NOTE — Discharge Summary (Signed)
Physician Discharge Summary  Patient ID: Vanessa [name] MRN: [id number] DOB/AGE: 37/19/1977 37 y.o.  Admit date: 04/16/2013 Discharge date: 05/09/2013  Admission Diagnoses:  Discharge Diagnoses:  Principal Problem:   Hypotension, unspecified Active Problems:   Radiation colitis   S/P left/sigmoid colectomy with anastomotic leak- ileostomy   Entero-colonic fistula   Colostomy prolapse, with revision    NICM EF of 20% by echo 01/2013-improved 35-40% 05/07/13   Discharged Condition: good  Hospital Course: Patient admitted after exploratory laparotomy with resection of multiple enteric fistulas and removal of drain.  Did well initially about 8 days, then developed and abdominal abscess and fistula with enteric contents in the anterior right side of the abdomen.  This was drained percutaneously and subsequently she has done well.  Now she is off TPN, eating fairly well, afebrile, wound is healing.  Colostomy is draining well and not prolapsed.  She now has a RLQ drain in place that the patient can care for herself.  Only draining about 1-20 cc per day.  She also developed hypercalcemia which did not seem to be secondary to hyperparathyroidism  We will need to follow this as outpatient.  This happened before while the patient was on TPN.  The patient also develop some hypotension while getting multiple antihypertensive medications.  I had Cardiology see her and their recommendation with be to stop many of her current antihypertensive medications.  The patient had been on coumadin prior to admission for DVT.  Repeat scan on this admission showed no evidence of DVT and this was discontinued  Consults: cardiology  Significant Diagnostic Studies: labs: cbc/bmet/prelCT scan: abdomen and pelvis and cardiac graphics: Echocardiogram: EF has improved significantly.  Treatments: IV hydration, antibiotics: Levaquin, TPN and surgery: small bowel resection.  Discharge Exam: Blood pressure 81/52,  pulse 64, temperature 97.4 F (36.3 C), temperature source Oral, resp. rate 18, height 5\' 4"  (1.626 m), weight 55.566 kg (122 lb 8 oz), last menstrual period 04/25/2009, SpO2 100.00%. General appearance: alert, cooperative and no distress Lungs:  Clear Abdomen:  Soft, excellent bowel sounds.  Ostomy is functioning well.  Midline wound is good  Disposition: 01-Home or Self Care   Future Appointments Provider Department Dept Phone   05/26/2013 8:00 AM [facility] [place] [facility] [address] [phone]   06/11/2013 8:45 AM Thurmon Fair, MD [facility] [place] [phone]       Medication List    ASK your doctor about these medications       carvedilol 12.5 MG tablet  Commonly known as:  COREG  Take 18.75 mg by mouth 2 (two) times daily with a meal.     furosemide 40 MG tablet  Commonly known as:  LASIX  Take 1 tablet (40 mg total) by mouth daily.     isosorbide-hydrALAZINE 20-37.5 MG per tablet  Commonly known as:  BIDIL  Take 1 tablet by mouth 2 (two) times daily.     lisinopril 20 MG tablet  Commonly known as:  PRINIVIL,ZESTRIL  Take 2 tablets (40 mg total) by mouth daily.     multivitamin with minerals Tabs tablet  Take 1 tablet by mouth daily.     ondansetron 4 MG tablet  Commonly known as:  ZOFRAN  Take 1 tablet (4 mg total) by mouth every 6 (six) hours.     oxyCODONE-acetaminophen 10-325 MG per tablet  Commonly known as:  PERCOCET  Take 1 tablet by mouth every 4 (four) hours as needed for pain.  potassium chloride 10 MEQ tablet  Commonly known as:  K-DUR  Take 2 tablets (20 mEq total) by mouth 3 (three) times daily.     spironolactone 12.5 mg Tabs tablet  Commonly known as:  ALDACTONE  Take 0.5 tablets (12.5 mg total) by mouth daily.     warfarin 5 MG tablet  Commonly known as:  COUMADIN  Take 1 tablet (5 mg total) by mouth daily.           Follow-up Information   Follow up with Melana Hingle, Marta Lamas, MD In 3 weeks.   Specialty:  General Surgery   Contact information:   [address] [place], [address]  [place] [address] [phone]       Signed: Cherylynn Ridges 05/09/2013, 7:32 AM

## 2013-05-17 ENCOUNTER — Emergency Department (HOSPITAL_COMMUNITY): Payer: Medicaid Other

## 2013-05-17 ENCOUNTER — Inpatient Hospital Stay (HOSPITAL_COMMUNITY)
Admission: EM | Admit: 2013-05-17 | Discharge: 2013-05-24 | DRG: 312 | Disposition: A | Discharge status: Home / Self Care | Payer: Medicaid Other | Attending: Internal Medicine | Admitting: Internal Medicine

## 2013-05-17 ENCOUNTER — Telehealth (INDEPENDENT_AMBULATORY_CARE_PROVIDER_SITE_OTHER): Payer: Self-pay | Admitting: General Surgery

## 2013-05-17 ENCOUNTER — Encounter (HOSPITAL_COMMUNITY): Payer: Self-pay | Admitting: Emergency Medicine

## 2013-05-17 ENCOUNTER — Telehealth (HOSPITAL_COMMUNITY): Payer: Self-pay | Admitting: Emergency Medicine

## 2013-05-17 DIAGNOSIS — Z87891 Personal history of nicotine dependence: Secondary | ICD-10-CM

## 2013-05-17 DIAGNOSIS — N12 Tubulo-interstitial nephritis, not specified as acute or chronic: Secondary | ICD-10-CM

## 2013-05-17 DIAGNOSIS — Z86718 Personal history of other venous thrombosis and embolism: Secondary | ICD-10-CM

## 2013-05-17 DIAGNOSIS — I272 Pulmonary hypertension, unspecified: Secondary | ICD-10-CM

## 2013-05-17 DIAGNOSIS — K625 Hemorrhage of anus and rectum: Secondary | ICD-10-CM

## 2013-05-17 DIAGNOSIS — R55 Syncope and collapse: Principal | ICD-10-CM

## 2013-05-17 DIAGNOSIS — I10 Essential (primary) hypertension: Secondary | ICD-10-CM | POA: Diagnosis present

## 2013-05-17 DIAGNOSIS — D62 Acute posthemorrhagic anemia: Secondary | ICD-10-CM

## 2013-05-17 DIAGNOSIS — N3001 Acute cystitis with hematuria: Secondary | ICD-10-CM

## 2013-05-17 DIAGNOSIS — K56699 Other intestinal obstruction unspecified as to partial versus complete obstruction: Secondary | ICD-10-CM

## 2013-05-17 DIAGNOSIS — I5042 Chronic combined systolic (congestive) and diastolic (congestive) heart failure: Secondary | ICD-10-CM

## 2013-05-17 DIAGNOSIS — Z933 Colostomy status: Secondary | ICD-10-CM

## 2013-05-17 DIAGNOSIS — N134 Hydroureter: Secondary | ICD-10-CM | POA: Diagnosis present

## 2013-05-17 DIAGNOSIS — I498 Other specified cardiac arrhythmias: Secondary | ICD-10-CM | POA: Diagnosis present

## 2013-05-17 DIAGNOSIS — R112 Nausea with vomiting, unspecified: Secondary | ICD-10-CM

## 2013-05-17 DIAGNOSIS — Z681 Body mass index (BMI) 19 or less, adult: Secondary | ICD-10-CM

## 2013-05-17 DIAGNOSIS — Z7901 Long term (current) use of anticoagulants: Secondary | ICD-10-CM

## 2013-05-17 DIAGNOSIS — I959 Hypotension, unspecified: Secondary | ICD-10-CM

## 2013-05-17 DIAGNOSIS — Z789 Other specified health status: Secondary | ICD-10-CM

## 2013-05-17 DIAGNOSIS — E876 Hypokalemia: Secondary | ICD-10-CM

## 2013-05-17 DIAGNOSIS — K2900 Acute gastritis without bleeding: Secondary | ICD-10-CM | POA: Diagnosis not present

## 2013-05-17 DIAGNOSIS — K9409 Other complications of colostomy: Secondary | ICD-10-CM

## 2013-05-17 DIAGNOSIS — D649 Anemia, unspecified: Secondary | ICD-10-CM

## 2013-05-17 DIAGNOSIS — N3000 Acute cystitis without hematuria: Secondary | ICD-10-CM | POA: Diagnosis present

## 2013-05-17 DIAGNOSIS — D75839 Thrombocytosis, unspecified: Secondary | ICD-10-CM

## 2013-05-17 DIAGNOSIS — D696 Thrombocytopenia, unspecified: Secondary | ICD-10-CM

## 2013-05-17 DIAGNOSIS — K632 Fistula of intestine: Secondary | ICD-10-CM

## 2013-05-17 DIAGNOSIS — D473 Essential (hemorrhagic) thrombocythemia: Secondary | ICD-10-CM

## 2013-05-17 DIAGNOSIS — Z932 Ileostomy status: Secondary | ICD-10-CM

## 2013-05-17 DIAGNOSIS — Z9049 Acquired absence of other specified parts of digestive tract: Secondary | ICD-10-CM

## 2013-05-17 DIAGNOSIS — K651 Peritoneal abscess: Secondary | ICD-10-CM

## 2013-05-17 DIAGNOSIS — R310 Gross hematuria: Secondary | ICD-10-CM

## 2013-05-17 DIAGNOSIS — I428 Other cardiomyopathies: Secondary | ICD-10-CM

## 2013-05-17 DIAGNOSIS — Z5181 Encounter for therapeutic drug level monitoring: Secondary | ICD-10-CM

## 2013-05-17 DIAGNOSIS — K567 Ileus, unspecified: Secondary | ICD-10-CM

## 2013-05-17 DIAGNOSIS — C539 Malignant neoplasm of cervix uteri, unspecified: Secondary | ICD-10-CM | POA: Diagnosis present

## 2013-05-17 DIAGNOSIS — K520 Gastroenteritis and colitis due to radiation: Secondary | ICD-10-CM | POA: Diagnosis present

## 2013-05-17 DIAGNOSIS — Z8541 Personal history of malignant neoplasm of cervix uteri: Secondary | ICD-10-CM

## 2013-05-17 DIAGNOSIS — R600 Localized edema: Secondary | ICD-10-CM

## 2013-05-17 DIAGNOSIS — E43 Unspecified severe protein-calorie malnutrition: Secondary | ICD-10-CM

## 2013-05-17 DIAGNOSIS — N3090 Cystitis, unspecified without hematuria: Secondary | ICD-10-CM

## 2013-05-17 DIAGNOSIS — I509 Heart failure, unspecified: Secondary | ICD-10-CM | POA: Diagnosis present

## 2013-05-17 LAB — CBC WITH DIFFERENTIAL/PLATELET
Basophils Relative: 0 % (ref 0–1)
Eosinophils Absolute: 0.3 10*3/uL (ref 0.0–0.7)
HCT: 30.6 % — ABNORMAL LOW (ref 36.0–46.0)
Hemoglobin: 10.2 g/dL — ABNORMAL LOW (ref 12.0–15.0)
MCH: 26.6 pg (ref 26.0–34.0)
MCHC: 33.3 g/dL (ref 30.0–36.0)
Monocytes Absolute: 0.7 10*3/uL (ref 0.1–1.0)
Monocytes Relative: 8 % (ref 3–12)

## 2013-05-17 LAB — RAPID URINE DRUG SCREEN, HOSP PERFORMED
Benzodiazepines: NOT DETECTED
Cocaine: NOT DETECTED
Opiates: NOT DETECTED
Tetrahydrocannabinol: POSITIVE — AB

## 2013-05-17 LAB — BASIC METABOLIC PANEL
BUN: 10 mg/dL (ref 6–23)
Calcium: 10.8 mg/dL — ABNORMAL HIGH (ref 8.4–10.5)
Creatinine, Ser: 0.84 mg/dL (ref 0.50–1.10)
GFR calc Af Amer: >90 mL/min (ref >90)
GFR calc non Af Amer: 88 mL/min — ABNORMAL LOW (ref >90)

## 2013-05-17 LAB — URINALYSIS, ROUTINE W REFLEX MICROSCOPIC
Specific Gravity, Urine: >1.03 — ABNORMAL HIGH (ref 1.005–1.030)
Urobilinogen, UA: 1 mg/dL (ref 0.0–1.0)
pH: 7 (ref 5.0–8.0)

## 2013-05-17 LAB — POCT I-STAT TROPONIN I: Troponin i, poc: 0 ng/mL (ref 0.00–0.08)

## 2013-05-17 LAB — URINE MICROSCOPIC-ADD ON

## 2013-05-17 MED ORDER — POTASSIUM CHLORIDE CRYS ER 20 MEQ PO TBCR
40.0000 meq | EXTENDED_RELEASE_TABLET | Freq: Once | ORAL | Status: AC
  Administered 2013-05-17: 40 meq via ORAL
  Filled 2013-05-17: qty 2

## 2013-05-17 MED ORDER — POTASSIUM CHLORIDE 10 MEQ/100ML IV SOLN
10.0000 meq | Freq: Once | INTRAVENOUS | Status: AC
  Administered 2013-05-17: 10 meq via INTRAVENOUS
  Filled 2013-05-17: qty 100

## 2013-05-17 MED ORDER — ONDANSETRON HCL 4 MG/2ML IJ SOLN
4.0000 mg | INTRAMUSCULAR | Status: AC
  Administered 2013-05-17: 4 mg via INTRAVENOUS
  Filled 2013-05-17: qty 2

## 2013-05-17 MED ORDER — SODIUM CHLORIDE 0.9 % IV BOLUS (SEPSIS)
1000.0000 mL | Freq: Once | INTRAVENOUS | Status: AC
  Administered 2013-05-17: 1000 mL via INTRAVENOUS

## 2013-05-17 MED ORDER — HYDROMORPHONE HCL PF 1 MG/ML IJ SOLN
1.0000 mg | Freq: Once | INTRAMUSCULAR | Status: AC
  Administered 2013-05-17: 1 mg via INTRAVENOUS
  Filled 2013-05-17: qty 1

## 2013-05-17 MED ORDER — IOHEXOL 350 MG/ML SOLN
80.0000 mL | Freq: Once | INTRAVENOUS | Status: AC | PRN
  Administered 2013-05-17: 80 mL via INTRAVENOUS

## 2013-05-17 MED ORDER — SODIUM CHLORIDE 0.9 % IV BOLUS (SEPSIS)
1000.0000 mL | Freq: Once | INTRAVENOUS | Status: AC
Start: 2013-05-17 — End: 2013-05-18
  Administered 2013-05-18: 1000 mL via INTRAVENOUS

## 2013-05-17 NOTE — ED Notes (Signed)
[facility] presents with a 37 yo female from family members' house with syncopal episode.  [facility] attempted radial pulse without success; sat pt up and pt became tachycardic (130 bpm).  Pt has been in this facility 3 weeks for colostomy revision and has been home for 1 week before this episode.  Hx of cervical cancer, CHF, intestinal blockage.  Pt upon last visit to hospital was taken off all medications.

## 2013-05-17 NOTE — Telephone Encounter (Signed)
Pt of Dr. [name] calling for additional Percocet 10/325 mg; offered Norco, but she wants the Percocet.  Understands Dr. [name] is not available and will present this request to Urgent office MD

## 2013-05-17 NOTE — Telephone Encounter (Signed)
Rerouted to JW's nurse.

## 2013-05-17 NOTE — ED Notes (Signed)
Patient transported to CT 

## 2013-05-17 NOTE — ED Provider Notes (Signed)
CSN: [id number]     Arrival date & time 05/17/13  1819 History   First MD Initiated Contact with Patient 05/17/13 1819     Chief Complaint  Patient presents with  . Loss of Consciousness   (Consider location/radiation/quality/duration/timing/severity/associated sxs/prior Treatment) HPI Comments: Patient is a 37 y/o female with a hx of cervical CA who developed subsequent radiation colitis with ileostomy, CHF, and NICM who presents for a syncopal episode at home this afternoon. Patient states she was sitting with her aunt in the kitchen; she states the next thing she remembers was waking up on the floor. Patient states it took her a minute or so to orient at baseline. She is unsure how long she was unconscious. Patient endorses nausea and 1 episode of NB/NB emesis after losing consciousness. She also admits to a frontal, nonradiating, aching headache. Patient denies recent or associated fevers, vision changes, tinnitus or hearing loss, difficulty speaking or swallowing, CP, SOB, and extremity numbness or weakness.  Patient recently d/c on 05/09/13 after exploratory laparotomy by Dr. [name]. Patient developed post op abdominal abscess and fistula with enteric contents in the anterior right abdomen which was drained percutaneously. RLQ drain has remained in place for drainage. Patient also with nonprolapsed colostomy. Non-ischemic cardiomyopathy EF of 20% by echo 01/2013-improved 35-40% 05/07/13; patient was subsequently taken off her cardiac life vest. Endorses cardiology f/u on 05/26/13  Patient is a 37 y.o. female presenting with syncope. The history is provided by the patient. No language interpreter was used.  Loss of Consciousness Associated symptoms: headaches, nausea and vomiting   Associated symptoms: no chest pain, no fever, no shortness of breath and no weakness     Past Medical History  Diagnosis Date  . History of blood transfusion     "I had ~ 7 in 01/2012"  . Anemia   . Radiation  Nov.18,2011-Jan.23,2012    cervix  . Hypertension     no treatment necessary for BP  since July 2013  . Non-ischemic cardiomyopathy, EF 25 % 02/14/2013  . At risk for sudden cardiac death 02-28-2013    hs life vest  . CHF (congestive heart failure)   . Dysrhythmia     WEARS LIFE VEST (DR. C [facility])  . Cervical cancer ~ 2011    cervical   Past Surgical History  Procedure Laterality Date  . Radiation implants  ~ 2011    "for cervical cancer"  . Colonoscopy  01/30/2012    Procedure: COLONOSCOPY;  Surgeon: Beverley Fiedler, MD;  Location: [facility] ENDOSCOPY;  Service: Gastroenterology;  Laterality: N/A;  . Colonoscopy  01/30/2012    Procedure: COLONOSCOPY;  Surgeon: Beverley Fiedler, MD;  Location: [facility] OR;  Service: Gastroenterology;  Laterality: N/A;  . Colostomy revision  02/04/2012    Procedure: COLON RESECTION SIGMOID;  Surgeon: Cherylynn Ridges, MD;  Location: [facility] OR;  Service: General;  Laterality: N/A;  . Cholecystectomy  2006  . Laparotomy  08/11/2012    Procedure: EXPLORATORY LAPAROTOMY;  Surgeon: Cherylynn Ridges, MD;  Location: [facility] OR;  Service: General;  Laterality: N/A;  . Laparoscopic lysis of adhesions  08/11/2012    Procedure: LAPAROSCOPIC LYSIS OF ADHESIONS;  Surgeon: Cherylynn Ridges, MD;  Location: [facility] OR;  Service: General;  Laterality: N/A;  . Bowel resection  08/11/2012    Procedure: SMALL BOWEL RESECTION;  Surgeon: Cherylynn Ridges, MD;  Location: [facility] OR;  Service: General;  Laterality: N/A;  . Colostomy  08/11/2012    Procedure: COLOSTOMY;  Surgeon: Cherylynn Ridges, MD;  Location: [facility] OR;  Service: General;  Laterality: N/A;  . Colostomy revision N/A 02/03/2013    Procedure: COLOSTOMY REVISION;  Surgeon: Cherylynn Ridges, MD;  Location: [facility] OR;  Service: General;  Laterality: N/A;  . Colostomy revision N/A 04/16/2013    Procedure: COLOSTOMY REVISION, REPAIR COLOSTOMY FISTULA;  Surgeon: Cherylynn Ridges, MD;  Location: [facility] OR;  Service: General;  Laterality: N/A;  . Laparotomy N/A 04/16/2013    Procedure:  EXPLORATORY LAPAROTOMY,Partial Colectomy,Small bowel resection;  Surgeon: Cherylynn Ridges, MD;  Location: [facility] OR;  Service: General;  Laterality: N/A;  . Appendectomy N/A 04/16/2013    Procedure: APPENDECTOMY;  Surgeon: Cherylynn Ridges, MD;  Location: [facility] OR;  Service: General;  Laterality: N/A;   Family History  Problem Relation Age of Onset  . Hypertension Mother   . Healthy Father   . Healthy Brother   . Healthy Brother    History  Substance Use Topics  . Smoking status: Former Smoker -- 0.50 packs/day for .5 years    Types: Cigarettes    Quit date: 01/26/2012  . Smokeless tobacco: Never Used  . Alcohol Use: 0.0 oz/week     Comment: last use 2010   OB History   Grav Para Term Preterm Abortions TAB SAB Ect Mult Living   10 8             Review of Systems  Constitutional: Negative for fever.  Respiratory: Negative for shortness of breath.   Cardiovascular: Positive for syncope. Negative for chest pain.  Gastrointestinal: Positive for nausea and vomiting. Negative for abdominal pain.  Genitourinary: Negative for dysuria and hematuria.  Skin: Negative for color change and pallor.  Neurological: Positive for syncope and headaches. Negative for weakness and numbness.  All other systems reviewed and are negative.    Allergies  Penicillins; Labetalol hcl; and Morphine and related  Home Medications   Current Outpatient Rx  Name  Route  Sig  Dispense  Refill  . Multiple Vitamin (MULTIVITAMIN WITH MINERALS) TABS   Oral   Take 1 tablet by mouth daily.         . ondansetron (ZOFRAN) 4 MG tablet   Oral   Take 1 tablet (4 mg total) by mouth every 6 (six) hours.   20 tablet   0   . oxyCODONE-acetaminophen (PERCOCET) 10-325 MG per tablet   Oral   Take 1 tablet by mouth every 4 (four) hours as needed for pain.   60 tablet   0    BP 118/82  Pulse 59  Temp(Src) 98.7 F (37.1 C) (Oral)  Resp 10  SpO2 100%  Physical Exam  Nursing note and vitals reviewed. Constitutional:  She is oriented to person, place, and time. She appears well-developed and well-nourished. No distress.  HENT:  Head: Normocephalic and atraumatic.  Mouth/Throat: Oropharynx is clear and moist. No oropharyngeal exudate.  Eyes: Conjunctivae and EOM are normal. No scleral icterus.  Neck: Normal range of motion.  Cardiovascular: Regular rhythm and normal heart sounds.   Tachycardic to 102  Pulmonary/Chest: Effort normal. No respiratory distress.  Abdominal: Soft. There is no tenderness. There is no rebound and no guarding.  RLQ drain in place without surrounding induration, erythema, TTP, or swelling; no heat-to-touch or purulent drainage. Colostomy bag present without prolapse. Stool brown in color and nonbloody.  Musculoskeletal: Normal range of motion.  Neurological: She is alert and oriented to person, place, and time.  Patient speaks in full  goal oriented sentences. Cranial nerves III through XII grossly intact. No sensory or motor deficits appreciated and patient moves extremities without ataxia. DTRs normal and symmetric.  Skin: Skin is warm and dry. No rash noted. She is not diaphoretic. No erythema. No pallor.  Psychiatric: She has a normal mood and affect. Her behavior is normal.    ED Course  Procedures (including critical care time) Labs Review Labs Reviewed  URINALYSIS, ROUTINE W REFLEX MICROSCOPIC - Abnormal; Notable for the following:    APPearance TURBID (*)    Specific Gravity, Urine >1.030 (*)    Hgb urine dipstick LARGE (*)    Protein, ur >300 (*)    Nitrite POSITIVE (*)    Leukocytes, UA TRACE (*)    All other components within normal limits  URINE RAPID DRUG SCREEN (HOSP PERFORMED) - Abnormal; Notable for the following:    Tetrahydrocannabinol POSITIVE (*)    All other components within normal limits  CBC WITH DIFFERENTIAL - Abnormal; Notable for the following:    RBC 3.83 (*)    Hemoglobin 10.2 (*)    HCT 30.6 (*)    RDW 18.3 (*)    Platelets 426 (*)    All  other components within normal limits  BASIC METABOLIC PANEL - Abnormal; Notable for the following:    Potassium 2.7 (*)    Calcium 10.8 (*)    GFR calc non Af Amer 88 (*)    All other components within normal limits  PRO B NATRIURETIC PEPTIDE - Abnormal; Notable for the following:    Pro B Natriuretic peptide (BNP) 826.2 (*)    All other components within normal limits  D-DIMER, QUANTITATIVE - Abnormal; Notable for the following:    D-Dimer, Quant 0.77 (*)    All other components within normal limits  POTASSIUM - Abnormal; Notable for the following:    Potassium 2.6 (*)    All other components within normal limits  URINE CULTURE  MAGNESIUM  URINE MICROSCOPIC-ADD ON  POTASSIUM  POCT I-STAT TROPONIN I    Date: 05/17/2013  Rate: 102  Rhythm: sinus tachycardia  QRS Axis: right  Intervals: normal  ST/T Wave abnormalities: nonspecific ST changes; ST depression V4-V6  Conduction Disutrbances:none  Narrative Interpretation: Sinus tachycardia with LVH and ST depression V4-V6; no STEMI  Old EKG Reviewed: more prominent ST depression in inferior leads; otherwise unchanged from 05/07/13 I have personally reviewed indeterminate this EKG  Imaging Review Ct Angio Chest Pe W/cm &/or Wo Cm  05/17/2013   *RADIOLOGY REPORT*  Clinical Data: Elevated D-dimer, abdominal pain  CT ANGIOGRAPHY CHEST,CT ABDOMEN AND PELVIS WITH CONTRAST  Technique:  Multidetector CT imaging of the chest using the standard protocol during bolus administration of intravenous contrast. Multiplanar reconstructed images including MIPs were obtained and reviewed to evaluate the vascular anatomy.,Technique: Mul  Contrast: 80mL OMNIPAQUE IOHEXOL 350 MG/ML SOLN  Comparison: 05/04/2013 abdominal CT, 08/06/2010 PET CT  CHEST: Findings: Pulmonary arteries are patent.  Normal caliber aorta and branch vessels.  Cardiomegaly.  No pleural or pericardial effusion. Mildly prominent left axillary lymph node measuring 1 cm short axis on image 93  series 6. This has developed since the prior PET CT. Otherwise, no intrathoracic lymphadenopathy.  Central airways are patent.  No pneumothorax.  Linear opacity within the right lower lobe, favored to reflect atelectasis or scar.  Unchanged 3 mm nodule right lung base, may be vascular. No acute osseous finding.  IMPRESSION: No pulmonary embolism or acute intrathoracic process.  Nonspecific enlarged left axillary  lymph node.  Consider tissue sampling.  Mild cardiomegaly/left ventricular enlargement.  Correlate with echocardiogram.  ABDOMEN/PELVIS Findings: Unremarkable liver, spleen, pancreas.  Cholecystectomy. No biliary ductal dilatation.  Unremarkable adrenal glands.  Symmetric renal enhancement.  Nonobstructing lower pole stone on the left.  Pancolitis and left lower quadrant colostomy.  No bowel obstruction.  Surgical drain terminates within the midline anterior abdomen.  Complex peripherally enhancing collection within the pelvis tracks superiorly, inseparable from adjacent loops of bowel. The amount of extraluminal gas has decreased.  Surgical anastomotic suture right lower quadrant. Open midline laparotomy.  Normal caliber aorta and branch vessels.  Circumferential bladder wall thickening.  Intraluminal high attenuation presumably reflects clot.  Uterus is presumably absent with irregularly enhancing soft tissue within the pelvis, similar to prior.  No interval osseous change.  IMPRESSION:  Postoperative changes of left lower quadrant colostomy. Nonspecific pancolitis.  No small bowel obstruction.  Pelvic abscess persists though has decreased in size, with the largest component measuring 2.1 x 1.1 cm within the left anterior pelvis.  Circumferential bladder wall thickening.  Interval intraluminal high attenuation presumably reflects blood clot.  Correlate with urinalysis.  May reflect cystitis including radiation cystitis.  Uterus is presumably absent with irregularly enhancing soft tissue within the pelvis,  similar to prior.   Original Report Authenticated By: Jearld Lesch, M.D.   Ct Abdomen Pelvis W Contrast  05/17/2013   *RADIOLOGY REPORT*  Clinical Data: Elevated D-dimer, abdominal pain  CT ANGIOGRAPHY CHEST,CT ABDOMEN AND PELVIS WITH CONTRAST  Technique:  Multidetector CT imaging of the chest using the standard protocol during bolus administration of intravenous contrast. Multiplanar reconstructed images including MIPs were obtained and reviewed to evaluate the vascular anatomy.,Technique: Mul  Contrast: 80mL OMNIPAQUE IOHEXOL 350 MG/ML SOLN  Comparison: 05/04/2013 abdominal CT, 08/06/2010 PET CT  CHEST: Findings: Pulmonary arteries are patent.  Normal caliber aorta and branch vessels.  Cardiomegaly.  No pleural or pericardial effusion. Mildly prominent left axillary lymph node measuring 1 cm short axis on image 93 series 6. This has developed since the prior PET CT. Otherwise, no intrathoracic lymphadenopathy.  Central airways are patent.  No pneumothorax.  Linear opacity within the right lower lobe, favored to reflect atelectasis or scar.  Unchanged 3 mm nodule right lung base, may be vascular. No acute osseous finding.  IMPRESSION: No pulmonary embolism or acute intrathoracic process.  Nonspecific enlarged left axillary lymph node.  Consider tissue sampling.  Mild cardiomegaly/left ventricular enlargement.  Correlate with echocardiogram.  ABDOMEN/PELVIS Findings: Unremarkable liver, spleen, pancreas.  Cholecystectomy. No biliary ductal dilatation.  Unremarkable adrenal glands.  Symmetric renal enhancement.  Nonobstructing lower pole stone on the left.  Pancolitis and left lower quadrant colostomy.  No bowel obstruction.  Surgical drain terminates within the midline anterior abdomen.  Complex peripherally enhancing collection within the pelvis tracks superiorly, inseparable from adjacent loops of bowel. The amount of extraluminal gas has decreased.  Surgical anastomotic suture right lower quadrant. Open  midline laparotomy.  Normal caliber aorta and branch vessels.  Circumferential bladder wall thickening.  Intraluminal high attenuation presumably reflects clot.  Uterus is presumably absent with irregularly enhancing soft tissue within the pelvis, similar to prior.  No interval osseous change.  IMPRESSION:  Postoperative changes of left lower quadrant colostomy. Nonspecific pancolitis.  No small bowel obstruction.  Pelvic abscess persists though has decreased in size, with the largest component measuring 2.1 x 1.1 cm within the left anterior pelvis.  Circumferential bladder wall thickening.  Interval intraluminal high attenuation presumably reflects blood  clot.  Correlate with urinalysis.  May reflect cystitis including radiation cystitis.  Uterus is presumably absent with irregularly enhancing soft tissue within the pelvis, similar to prior.   Original Report Authenticated By: Jearld Lesch, M.D.   MDM   1. Syncope   2. Hypokalemia   3. Cystitis    37 y/o female with complicated PMH, recently d/c on 05/09/13 after exploratory laparotomy by Dr. [name] and subsequent post op abdominal abscess and fistula, who presents after a syncopal episode at home. Patient with hx of CHF and NICM; recently taken off cardiac life vest after improved EF from 20% to 35-40% by echo. CT angio chest without evidence of D dimer; patient does have increase in her BNP to 826 over the past week. Patient also persistently hypokalemic; PO and IV administered in ED. EKG with more prominent ST depression in V4-V6, otherwise unchanged. CT abdomen pelvis obtained after patient had grossly bloody void and began c/o abdominal pain. CT significant for cystitis - ? infectious vs radiation cystitis. Given work up, patient meets criteria for admission for further evaluation/management. [facility] to admit. Temp admit orders placed.    Antony Madura, PA-C 05/18/13 0045

## 2013-05-17 NOTE — Telephone Encounter (Signed)
Pt called for refill of her Percocet 10/ 325 mg.  Her surgery was 4 weeks ago and she has been home from the hospital for 1 week now.  Pt is aware that Dr. [name] is not available today and may have to defer to "standing orders protocol" for Norco.  She does not want this, but understands that may be all that is available since her surgery was 4 weeks ago and it's time to wean off the narcotics.  Will ask Urgent office MD.

## 2013-05-17 NOTE — Telephone Encounter (Signed)
Per Dr. Carolynne Edouard (urgent office MD) called in Norco 5/325 mg, # 30, 1-2 po Q4-6H prn pain, no RF to [facility]:  [phone].  Pt is aware of refill (not for percocet, as she had requested.)  Also informed pt of her post op appt on 06/01/13 at 11:50 with Dr. [name].

## 2013-05-18 ENCOUNTER — Other Ambulatory Visit: Payer: Self-pay | Admitting: Urology

## 2013-05-18 ENCOUNTER — Encounter (HOSPITAL_COMMUNITY): Payer: Self-pay | Admitting: Internal Medicine

## 2013-05-18 DIAGNOSIS — R55 Syncope and collapse: Principal | ICD-10-CM

## 2013-05-18 DIAGNOSIS — E876 Hypokalemia: Secondary | ICD-10-CM

## 2013-05-18 LAB — BASIC METABOLIC PANEL
CO2: 20 mEq/L (ref 19–32)
GFR calc non Af Amer: >90 mL/min (ref >90)
Glucose, Bld: 133 mg/dL — ABNORMAL HIGH (ref 70–99)
Potassium: 3.2 mEq/L — ABNORMAL LOW (ref 3.5–5.1)
Sodium: 140 mEq/L (ref 135–145)

## 2013-05-18 LAB — CBC
Hemoglobin: 8.1 g/dL — ABNORMAL LOW (ref 12.0–15.0)
MCH: 26.8 pg (ref 26.0–34.0)
MCH: 26.9 pg (ref 26.0–34.0)
MCV: 80.8 fL (ref 78.0–100.0)
MCV: 80.6 fL (ref 78.0–100.0)
Platelets: 342 10*3/uL (ref 150–400)
RBC: 3.02 MIL/uL — ABNORMAL LOW (ref 3.87–5.11)
RBC: 2.83 MIL/uL — ABNORMAL LOW (ref 3.87–5.11)
RDW: 18.7 % — ABNORMAL HIGH (ref 11.5–15.5)
WBC: 7.8 10*3/uL (ref 4.0–10.5)

## 2013-05-18 MED ORDER — ADULT MULTIVITAMIN W/MINERALS CH
1.0000 | ORAL_TABLET | Freq: Every day | ORAL | Status: DC
  Administered 2013-05-18 – 2013-05-24 (×7): 1 via ORAL
  Filled 2013-05-18 (×7): qty 1

## 2013-05-18 MED ORDER — POTASSIUM CHLORIDE CRYS ER 20 MEQ PO TBCR
40.0000 meq | EXTENDED_RELEASE_TABLET | Freq: Once | ORAL | Status: AC
  Administered 2013-05-18: 40 meq via ORAL
  Filled 2013-05-18: qty 2

## 2013-05-18 MED ORDER — ONDANSETRON HCL 4 MG/2ML IJ SOLN
4.0000 mg | Freq: Four times a day (QID) | INTRAMUSCULAR | Status: DC | PRN
  Administered 2013-05-21: 4 mg via INTRAVENOUS
  Filled 2013-05-18: qty 2

## 2013-05-18 MED ORDER — OXYCODONE-ACETAMINOPHEN 5-325 MG PO TABS
1.0000 | ORAL_TABLET | ORAL | Status: DC | PRN
  Administered 2013-05-18 – 2013-05-24 (×27): 1 via ORAL
  Filled 2013-05-18 (×27): qty 1

## 2013-05-18 MED ORDER — ACETAMINOPHEN 325 MG PO TABS: 650.0000 mg | ORAL_TABLET | Freq: Four times a day (QID) | ORAL | Status: DC | PRN

## 2013-05-18 MED ORDER — SODIUM CHLORIDE 0.9 % IJ SOLN
3.0000 mL | Freq: Two times a day (BID) | INTRAMUSCULAR | Status: DC
Start: 2013-05-18 — End: 2013-05-24
  Administered 2013-05-18 – 2013-05-23 (×11): 3 mL via INTRAVENOUS

## 2013-05-18 MED ORDER — POTASSIUM CHLORIDE CRYS ER 20 MEQ PO TBCR
40.0000 meq | EXTENDED_RELEASE_TABLET | Freq: Two times a day (BID) | ORAL | Status: DC
  Administered 2013-05-18 – 2013-05-21 (×8): 40 meq via ORAL
  Filled 2013-05-18 (×14): qty 2

## 2013-05-18 MED ORDER — ONDANSETRON HCL 4 MG PO TABS
4.0000 mg | ORAL_TABLET | Freq: Four times a day (QID) | ORAL | Status: DC
  Administered 2013-05-18 – 2013-05-24 (×21): 4 mg via ORAL
  Filled 2013-05-18 (×34): qty 1

## 2013-05-18 MED ORDER — OXYCODONE HCL 5 MG PO TABS
5.0000 mg | ORAL_TABLET | ORAL | Status: DC | PRN
  Administered 2013-05-18 – 2013-05-24 (×27): 5 mg via ORAL
  Filled 2013-05-18 (×27): qty 1

## 2013-05-18 MED ORDER — ACETAMINOPHEN 650 MG RE SUPP: 650.0000 mg | Freq: Four times a day (QID) | RECTAL | Status: DC | PRN

## 2013-05-18 MED ORDER — ONDANSETRON HCL 4 MG PO TABS
4.0000 mg | ORAL_TABLET | Freq: Four times a day (QID) | ORAL | Status: DC | PRN
  Filled 2013-05-18 (×5): qty 1

## 2013-05-18 MED ORDER — OXYCODONE-ACETAMINOPHEN 5-325 MG PO TABS
2.0000 | ORAL_TABLET | Freq: Once | ORAL | Status: AC
  Administered 2013-05-18: 2 via ORAL
  Filled 2013-05-18: qty 2

## 2013-05-18 MED ORDER — SODIUM CHLORIDE 0.9 % IJ SOLN: 3.0000 mL | Freq: Two times a day (BID) | INTRAMUSCULAR | Status: DC

## 2013-05-18 MED ORDER — OXYCODONE-ACETAMINOPHEN 10-325 MG PO TABS: 1.0000 | ORAL_TABLET | ORAL | Status: DC | PRN

## 2013-05-18 NOTE — Progress Notes (Signed)
INITIAL NUTRITION ASSESSMENT  DOCUMENTATION CODES Per approved criteria  -Severe malnutrition in the context of chronic illness   INTERVENTION:  No addition of nutrition supplements at this time ---> patient declined  Continue MVI with minerals daily RD to follow for nutrition care plan  NUTRITION DIAGNOSIS: Increased nutrient needs related to malnutrition, repletion as evidenced by estimated nutrition needs  Goal: Pt to meet >/= 90% of their estimated nutrition needs   Monitor:  PO intake, weight, labs, I/O's  Reason for Assessment: Health Hx  37 y.o. 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   Patient presented to [facility] ED 8/25 after having a syncopal episode while at her Aunt's house; reported new hematuria; Hgb dropped from 10.2 to 8.1 ---> admitted for further management.  Patient reports her appetite is good; has been eating 3 meals per day as well as snacks; CWOCN note reviewed 8/26 ---> full thickness healing chronic abdominal wound.  Patient meets criteria for severe malnutrition in the context of chronic illness as evidenced by < 75% intake of estimated energy requirement for > 1 month and 13% weight loss x 1 month.  Height: Ht Readings from Last 1 Encounters:  05/18/13 5\' 5"  (1.651 m)    Weight: Wt Readings from Last 1 Encounters:  05/18/13 114 lb 14.4 oz (52.118 kg)    Ideal Body Weight: 125 lb  % Ideal Body Weight: 91%  Wt Readings from Last 10 Encounters:  05/18/13 114 lb 14.4 oz (52.118 kg)  05/06/13 122 lb 8 oz (55.566 kg)  05/06/13 122 lb 8 oz (55.566 kg)  04/14/13 131 lb 9.6 oz (59.693 kg)  04/06/13 129 lb 1.6 oz (58.559 kg)  03/30/13 123 lb 9.6 oz (56.065 kg)  03/28/13 129 lb (58.514 kg)  03/17/13 129 lb (58.514 kg)  02/23/13 128 lb 8 oz  (58.287 kg)  02/23/13 129 lb (58.514 kg)    Usual Body Weight: 131 lb  % Usual Body Weight: 87%  BMI:  Body mass index is 19.12 kg/(m^2).  Estimated Nutritional Needs: Kcal: 1700-2000 Protein: 80-95 gm Fluid: 1.7-2.0 L  Skin: abdominal wound  Diet Order: Cardiac  EDUCATION NEEDS: -No education needs identified at this time   Intake/Output Summary (Last 24 hours) at 05/18/13 1457 Last data filed at 05/18/13 1300  Gross per 24 hour  Intake   1240 ml  Output    761 ml  Net    479 ml    Labs:   Recent Labs Lab 05/17/13 1828 05/17/13 2035 05/17/13 2120 05/18/13 0020 05/18/13 0535  NA 139  --   --   --  140  K 2.7* 2.6*  --  3.4* 3.2*  CL 104  --   --   --  111  CO2 21  --   --   --  20  BUN 10  --   --   --  10  CREATININE 0.84  --   --   --  0.74  CALCIUM 10.8*  --   --   --  9.4  MG  --   --  1.8  --  1.6  GLUCOSE 90  --   --   --  133*    Scheduled Meds: . multivitamin with minerals  1 tablet Oral Daily  . ondansetron  4 mg Oral  Q6H  . potassium chloride  40 mEq Oral BID  . sodium chloride  3 mL Intravenous Q12H    Continuous Infusions:   Past Medical History  Diagnosis Date  . History of blood transfusion     "I had ~ 7 in 01/2012"  . Anemia   . Radiation Nov.18,2011-Jan.23,2012    cervix  . Hypertension     no treatment necessary for BP  since July 2013  . Non-ischemic cardiomyopathy, EF 25 % 02/14/2013  . At risk for sudden cardiac death 02/28/2013    hs life vest  . CHF (congestive heart failure)   . Dysrhythmia     WEARS LIFE VEST (DR. C [facility])  . Cervical cancer ~ 2011    cervical    Past Surgical History  Procedure Laterality Date  . Radiation implants  ~ 2011    "for cervical cancer"  . Colonoscopy  01/30/2012    Procedure: COLONOSCOPY;  Surgeon: Beverley Fiedler, MD;  Location: [facility] ENDOSCOPY;  Service: Gastroenterology;  Laterality: N/A;  . Colonoscopy  01/30/2012    Procedure: COLONOSCOPY;  Surgeon: Beverley Fiedler, MD;  Location: [facility] OR;   Service: Gastroenterology;  Laterality: N/A;  . Colostomy revision  02/04/2012    Procedure: COLON RESECTION SIGMOID;  Surgeon: Cherylynn Ridges, MD;  Location: [facility] OR;  Service: General;  Laterality: N/A;  . Cholecystectomy  2006  . Laparotomy  08/11/2012    Procedure: EXPLORATORY LAPAROTOMY;  Surgeon: Cherylynn Ridges, MD;  Location: [facility] OR;  Service: General;  Laterality: N/A;  . Laparoscopic lysis of adhesions  08/11/2012    Procedure: LAPAROSCOPIC LYSIS OF ADHESIONS;  Surgeon: Cherylynn Ridges, MD;  Location: [facility] OR;  Service: General;  Laterality: N/A;  . Bowel resection  08/11/2012    Procedure: SMALL BOWEL RESECTION;  Surgeon: Cherylynn Ridges, MD;  Location: [facility] OR;  Service: General;  Laterality: N/A;  . Colostomy  08/11/2012    Procedure: COLOSTOMY;  Surgeon: Cherylynn Ridges, MD;  Location: [facility] OR;  Service: General;  Laterality: N/A;  . Colostomy revision N/A 02/03/2013    Procedure: COLOSTOMY REVISION;  Surgeon: Cherylynn Ridges, MD;  Location: [facility] OR;  Service: General;  Laterality: N/A;  . Colostomy revision N/A 04/16/2013    Procedure: COLOSTOMY REVISION, REPAIR COLOSTOMY FISTULA;  Surgeon: Cherylynn Ridges, MD;  Location: [facility] OR;  Service: General;  Laterality: N/A;  . Laparotomy N/A 04/16/2013    Procedure: EXPLORATORY LAPAROTOMY,Partial Colectomy,Small bowel resection;  Surgeon: Cherylynn Ridges, MD;  Location: [facility] OR;  Service: General;  Laterality: N/A;  . Appendectomy N/A 04/16/2013    Procedure: APPENDECTOMY;  Surgeon: Cherylynn Ridges, MD;  Location: [facility] OR;  Service: General;  Laterality: N/A;    Maureen Chatters, RD, LDN Pager #: [phone] After-Hours Pager #: [phone]

## 2013-05-18 NOTE — Progress Notes (Signed)
Informed Dr. Toniann Fail of clots in urine, present during UA.  Informed patient to inform RN if she should feel "full" after urination or retention of urine.  No concerns at this time.  Patient resting comfortably.  Will continue to monitor.  Barrie Lyme RN 1:50 AM 05/18/2013

## 2013-05-18 NOTE — Consult Note (Signed)
Consult: Gross hematuria Requested by: Dr. Mahala Menghini  History of Present Illness:  Patient reports she developed gross hematuria with clots yesterday in the ER when she presented for syncope. She continues to have some small clots but is passing her urine without difficulty. A post void was 176 cc. A CT the abdomen and pelvis showed a thickened bladder wall and high density material in the bladder likely clots although tumor needs to be ruled out. There was no hydronephrosis, the ureters appeared normal. There are markers or brachy seeds posterior to the bladder at the vaginal cuff. Ureters diffucult to follow. I reviewed all the images. Her renal function is normal. UA showed TNTC rbc and rare bacteria. She has no dysuria or fever.   She has had radiation for endometrial cancer and subsequent extensive pelvic surgery, abscesses and fistula. She had hematuria without clots in May of 2014 and was seen as an inpatient consult by me. At that time CT of the abdomen and pelvis was normal. She did not followup as instructed for cystoscopy.   Prior hx -  Appreciate list provided by Dr. Mahala Menghini: Admission 04/16/13 for repair of ostomy, noted furhter abcesses as above-to see Dr. [name] 9/14  Admission 02/03/13 with prolapsed ischemic colostomy status post revision, DVT at that time noted-had an enterocolonic fisutla as well addressed at subsequent hospitalization as above  Admission 5 8 14  with left lower quadrant pain  Admission 08/11/12 for ileostomy reversal of frozen pelvis did not allow this and had ileostomy converted 2 colostomy  Admission 03/21/2012 with small bowel obstruction  Admission 03/16/2012 small bowel obstruction vs. Ileus  Admission 5/6/013 for colon stricture colonoscopy with high-grade stricture-status post colon resection and sigmoid diverting loop ileostomy 02/04/2012-had complications including , Infection, abdominal abscesses, wound care issues-hospital service, ID, hematology was  involved in her care  Admission 10/02/2010 for repeat Brachytherapy in the management of cervical cancer  Admission 08/18/2008 postpartum preeclampsia   Past Medical History  Diagnosis Date  . History of blood transfusion     "I had ~ 7 in 01/2012"  . Anemia   . Radiation Nov.18,2011-Jan.23,2012    cervix  . Hypertension     no treatment necessary for BP  since July 2013  . Non-ischemic cardiomyopathy, EF 25 % 02/14/2013  . At risk for sudden cardiac death 03/05/13    hs life vest  . CHF (congestive heart failure)   . Dysrhythmia     WEARS LIFE VEST (DR. C [facility])  . Cervical cancer ~ 2011    cervical   Past Surgical History  Procedure Laterality Date  . Radiation implants  ~ 2011    "for cervical cancer"  . Colonoscopy  01/30/2012    Procedure: COLONOSCOPY;  Surgeon: Beverley Fiedler, MD;  Location: [facility] ENDOSCOPY;  Service: Gastroenterology;  Laterality: N/A;  . Colonoscopy  01/30/2012    Procedure: COLONOSCOPY;  Surgeon: Beverley Fiedler, MD;  Location: [facility] OR;  Service: Gastroenterology;  Laterality: N/A;  . Colostomy revision  02/04/2012    Procedure: COLON RESECTION SIGMOID;  Surgeon: Cherylynn Ridges, MD;  Location: [facility] OR;  Service: General;  Laterality: N/A;  . Cholecystectomy  2006  . Laparotomy  08/11/2012    Procedure: EXPLORATORY LAPAROTOMY;  Surgeon: Cherylynn Ridges, MD;  Location: [facility] OR;  Service: General;  Laterality: N/A;  . Laparoscopic lysis of adhesions  08/11/2012    Procedure: LAPAROSCOPIC LYSIS OF ADHESIONS;  Surgeon: Cherylynn Ridges, MD;  Location: [facility] OR;  Service: General;  Laterality: N/A;  . Bowel resection  08/11/2012    Procedure: SMALL BOWEL RESECTION;  Surgeon: Cherylynn Ridges, MD;  Location: [facility] OR;  Service: General;  Laterality: N/A;  . Colostomy  08/11/2012    Procedure: COLOSTOMY;  Surgeon: Cherylynn Ridges, MD;  Location: [facility] OR;  Service: General;  Laterality: N/A;  . Colostomy revision N/A 02/03/2013    Procedure: COLOSTOMY REVISION;  Surgeon: Cherylynn Ridges, MD;   Location: [facility] OR;  Service: General;  Laterality: N/A;  . Colostomy revision N/A 04/16/2013    Procedure: COLOSTOMY REVISION, REPAIR COLOSTOMY FISTULA;  Surgeon: Cherylynn Ridges, MD;  Location: [facility] OR;  Service: General;  Laterality: N/A;  . Laparotomy N/A 04/16/2013    Procedure: EXPLORATORY LAPAROTOMY,Partial Colectomy,Small bowel resection;  Surgeon: Cherylynn Ridges, MD;  Location: [facility] OR;  Service: General;  Laterality: N/A;  . Appendectomy N/A 04/16/2013    Procedure: APPENDECTOMY;  Surgeon: Cherylynn Ridges, MD;  Location: [facility] OR;  Service: General;  Laterality: N/A;    Home Medications:  Prescriptions prior to admission  Medication Sig Dispense Refill  . Multiple Vitamin (MULTIVITAMIN WITH MINERALS) TABS Take 1 tablet by mouth daily.      . ondansetron (ZOFRAN) 4 MG tablet Take 1 tablet (4 mg total) by mouth every 6 (six) hours.  20 tablet  0  . oxyCODONE-acetaminophen (PERCOCET) 10-325 MG per tablet Take 1 tablet by mouth every 4 (four) hours as needed for pain.  60 tablet  0   Allergies:  Allergies  Allergen Reactions  . Penicillins Other (See Comments)    "anything w/penicillin in it makes me bleed"  . Labetalol Hcl Itching and Other (See Comments)    Bumps to bilateral arms.  . Morphine And Related Hives    Family History  Problem Relation Age of Onset  . Hypertension Mother   . Healthy Father   . Healthy Brother   . Healthy Brother    Social History:  reports that she quit smoking about 15 months ago. Her smoking use included Cigarettes. She has a .25 pack-year smoking history. She has never used smokeless tobacco. She reports that  drinks alcohol. She reports that she uses illicit drugs (Marijuana).  ROS: A complete review of systems was performed.  All systems are negative except for pertinent findings as noted. @ROS @   Physical Exam: Nurse was in room as chaperone.  Vital signs in last 24 hours: Temp:  [97.6 F (36.4 C)-97.8 F (36.6 C)] 97.6 F (36.4 C) (08/26  1451) Pulse Rate:  [55-130] 55 (08/26 1451) Resp:  [9-24] 19 (08/26 1451) BP: (105-142)/(58-93) 119/66 mmHg (08/26 1451) SpO2:  [96 %-100 %] 100 % (08/26 1451) Weight:  [52.118 kg (114 lb 14.4 oz)] 52.118 kg (114 lb 14.4 oz) (08/26 0103) General:  Alert and oriented, No acute distress HEENT: Normocephalic, atraumatic Neck: No JVD or lymphadenopathy Cardiovascular: Regular rate and rhythm Lungs: Regular rate and effort Abdomen: Soft, nontender, nondistended, no abdominal masses, ML dressing, LLQ stoma, RLQ pelvic drain  Back: No CVA tenderness Extremities: No edema Neurologic: Grossly intact  Laboratory Data:  Results for orders placed during the hospital encounter of 05/17/13 (from the past 24 hour(s))  URINALYSIS, ROUTINE W REFLEX MICROSCOPIC     Status: Abnormal   Collection Time    05/17/13  8:30 PM      Result Value Range   Color, Urine YELLOW  YELLOW   APPearance TURBID (*) CLEAR   Specific Gravity, Urine >1.030 (*) 1.005 -  1.030   pH 7.0  5.0 - 8.0   Glucose, UA NEGATIVE  NEGATIVE mg/dL   Hgb urine dipstick LARGE (*) NEGATIVE   Bilirubin Urine NEGATIVE  NEGATIVE   Ketones, ur NEGATIVE  NEGATIVE mg/dL   Protein, ur >161 (*) NEGATIVE mg/dL   Urobilinogen, UA 1.0  0.0 - 1.0 mg/dL   Nitrite POSITIVE (*) NEGATIVE   Leukocytes, UA TRACE (*) NEGATIVE  URINE RAPID DRUG SCREEN (HOSP PERFORMED)     Status: Abnormal   Collection Time    05/17/13  8:30 PM      Result Value Range   Opiates NONE DETECTED  NONE DETECTED   Cocaine NONE DETECTED  NONE DETECTED   Benzodiazepines NONE DETECTED  NONE DETECTED   Amphetamines NONE DETECTED  NONE DETECTED   Tetrahydrocannabinol POSITIVE (*) NONE DETECTED   Barbiturates NONE DETECTED  NONE DETECTED  URINE MICROSCOPIC-ADD ON     Status: None   Collection Time    05/17/13  8:30 PM      Result Value Range   WBC, UA 0-2  <3 WBC/hpf   RBC / HPF TOO NUMEROUS TO COUNT  <3 RBC/hpf   Bacteria, UA RARE  RARE   Urine-Other URINALYSIS PERFORMED  ON SUPERNATANT    POTASSIUM     Status: Abnormal   Collection Time    05/17/13  8:35 PM      Result Value Range   Potassium 2.6 (*) 3.5 - 5.1 mEq/L  MAGNESIUM     Status: None   Collection Time    05/17/13  9:20 PM      Result Value Range   Magnesium 1.8  1.5 - 2.5 mg/dL  POCT I-STAT TROPONIN I     Status: None   Collection Time    05/17/13 11:18 PM      Result Value Range   Troponin i, poc 0.00  0.00 - 0.08 ng/mL   Comment 3           POTASSIUM     Status: Abnormal   Collection Time    05/18/13 12:20 AM      Result Value Range   Potassium 3.4 (*) 3.5 - 5.1 mEq/L  MAGNESIUM     Status: None   Collection Time    05/18/13  5:35 AM      Result Value Range   Magnesium 1.6  1.5 - 2.5 mg/dL  BASIC METABOLIC PANEL     Status: Abnormal   Collection Time    05/18/13  5:35 AM      Result Value Range   Sodium 140  135 - 145 mEq/L   Potassium 3.2 (*) 3.5 - 5.1 mEq/L   Chloride 111  96 - 112 mEq/L   CO2 20  19 - 32 mEq/L   Glucose, Bld 133 (*) 70 - 99 mg/dL   BUN 10  6 - 23 mg/dL   Creatinine, Ser 0.96  0.50 - 1.10 mg/dL   Calcium 9.4  8.4 - 04.5 mg/dL   GFR calc non Af Amer >90  >90 mL/min   GFR calc Af Amer >90  >90 mL/min  CBC     Status: Abnormal   Collection Time    05/18/13  5:35 AM      Result Value Range   WBC 7.8  4.0 - 10.5 K/uL   RBC 3.02 (*) 3.87 - 5.11 MIL/uL   Hemoglobin 8.1 (*) 12.0 - 15.0 g/dL   HCT 40.9 (*) 81.1 - 91.4 %  MCV 80.8  78.0 - 100.0 fL   MCH 26.8  26.0 - 34.0 pg   MCHC 33.2  30.0 - 36.0 g/dL   RDW 40.9 (*) 81.1 - 91.4 %   Platelets 359  150 - 400 K/uL  TYPE AND SCREEN     Status: None   Collection Time    05/18/13  2:18 PM      Result Value Range   ABO/RH(D) A POS     Antibody Screen NEG     Sample Expiration 05/21/2013    CBC     Status: Abnormal   Collection Time    05/18/13  2:18 PM      Result Value Range   WBC 8.9  4.0 - 10.5 K/uL   RBC 2.83 (*) 3.87 - 5.11 MIL/uL   Hemoglobin 7.6 (*) 12.0 - 15.0 g/dL   HCT 78.2 (*) 95.6 - 21.3  %   MCV 80.6  78.0 - 100.0 fL   MCH 26.9  26.0 - 34.0 pg   MCHC 33.3  30.0 - 36.0 g/dL   RDW 08.6 (*) 57.8 - 46.9 %   Platelets 342  150 - 400 K/uL   No results found for this or any previous visit (from the past 240 hour(s)). Creatinine:  Recent Labs  05/17/13 1828 05/18/13 0535  CREATININE 0.84 0.74    Impression/Assessment:  Gross hematuria  Plan:  I discussed with the patient the nature, potential benefits, risks and alternatives to cystoscopy, clot evacuation, bilateral retrograde pyelograms, possible bladder biopsy/fulguration, including side effects of the proposed treatment, the likelihood of the patient achieving the goals of the procedure, and any potential problems that might occur during the procedure or recuperation. All questions answered. Patient elects to proceed. I discussed with the patient my associate Dr. Sherron Monday will perform the procedure tomorrow at [facility]. I discussed the patient with Dr. Sherron Monday.     Antony Haste 05/18/2013, 7:35 PM

## 2013-05-18 NOTE — Consult Note (Addendum)
WOC consult Note Reason for Consult: Consult requested for abd wound.  Pt is familiar to wound care team from multiple previous admissions. She is followed by [facility] as outpatient.  Wound type: Full thickness healing chronic wound Measurement:3X.2X.2cm Wound bed: 100% red and moist Drainage (amount, consistency, odor) Scant amt yellow drainage, no odor Periwound: Intact skin surrounding Dressing procedure/placement/frequency: Continue present plan of care which has been ordered by [facility] with moist gauze packing Q day. Pt familiar with topical treatment and independent with ostomy and abd wound care prior to admission.  Left ostomy pouch intact with good seal.  Stoma red and viable when visualized through pouch. 1 1/2 inches and above skin level.  No further prolapse at this time, since that has occurred in the past for the patient. Pt very knowledgeable regarding pouch application, emptying, and routines.  Large amt liquid yellow-tan drainage in pouch.  Provided supplies and Programme researcher, broadcasting/film/video for ordering when at home upon pt request.  Denies need for further assistance or questions regarding ostomy at this time. Please re-consult if further assistance is needed.  Thank-you,  Cammie Mcgee MSN, RN, CWOCN, [place], CNS [phone]

## 2013-05-18 NOTE — ED Notes (Signed)
Transporting patient to floor. 

## 2013-05-18 NOTE — Consult Note (Signed)
Reason for Consult: syncope Referring Physician:   [name] is an 38 y.o. female.  HPI:   The patient is a 37 y.o. female with a history of cervical cancer, radiation treatment w/ subsequent radiation colitis, continued tobacco abuse since age 38, HTN, eclampsia, anemia, colostomy plus revision and cholecystectomy. Family history is significant for CAD in a grandmother and HTN in motherr. She had a 2D echo on 01/30/13 which revealed and EF of 20%, diffuse hypokinesis, grade two diastolic dysfunction and PA pressure of .  Repeat Echo on 05/07/13 showed improved EF to 35-40% with continued diffuse hypokinesis. Cardiac MRI showed no evidence of coronary disease or anomalies or an obvious etiology for her cardiomyopathy. Subsequently, she has been wearing a LifeVest for primary prevention of sudden cardiac death but with the improved EF this was discontinued. 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  She presented yesterday after having a syncopal episode while at her aunt's house.  The patient states she had come in from being outside, sat down and had a glass of ice water.  Within a minute she had passed out and woke up on the floor where her family had laid her.  She was only out for approximately 6-10 secs.  She reports no prior symptoms and currently denies nausea, vomiting, fever, chest pain, shortness of breath, orthopnea, dizziness, PND, cough, congestion, abdominal pain, hematochezia, melena, lower extremity edema.  She does however, reports new hematuria since arriving at the ER and her Hgb dropped from 10.2 to 8.1.   Potassium was also low at 2.7.  She has not had any events on telemetry which would explain a syncopal event.  Potassium has improved to 3.2.      Past Medical History  Diagnosis Date  . History of blood transfusion     "I had ~ 7 in 01/2012"  . Anemia   . Radiation  Nov.18,2011-Jan.23,2012    cervix  . Hypertension     no treatment necessary for BP  since July 2013  . Non-ischemic cardiomyopathy, EF 25 % 02/14/2013  . At risk for sudden cardiac death 02-28-13    hs life vest  . CHF (congestive heart failure)   . Dysrhythmia     WEARS LIFE VEST (DR. C [facility])  . Cervical cancer ~ 2011    cervical    Past Surgical History  Procedure Laterality Date  . Radiation implants  ~ 2011    "for cervical cancer"  . Colonoscopy  01/30/2012    Procedure: COLONOSCOPY;  Surgeon: Beverley Fiedler, MD;  Location: [facility] ENDOSCOPY;  Service: Gastroenterology;  Laterality: N/A;  . Colonoscopy  01/30/2012    Procedure: COLONOSCOPY;  Surgeon: Beverley Fiedler, MD;  Location: [facility] OR;  Service: Gastroenterology;  Laterality: N/A;  . Colostomy revision  02/04/2012    Procedure: COLON RESECTION SIGMOID;  Surgeon: Cherylynn Ridges, MD;  Location: [facility] OR;  Service: General;  Laterality: N/A;  . Cholecystectomy  2006  . Laparotomy  08/11/2012    Procedure: EXPLORATORY LAPAROTOMY;  Surgeon: Cherylynn Ridges, MD;  Location: [facility] OR;  Service: General;  Laterality: N/A;  . Laparoscopic lysis of adhesions  08/11/2012    Procedure: LAPAROSCOPIC LYSIS OF ADHESIONS;  Surgeon: Cherylynn Ridges, MD;  Location: [facility] OR;  Service: General;  Laterality: N/A;  . Bowel resection  08/11/2012    Procedure: SMALL BOWEL RESECTION;  Surgeon: Marta Lamas  [name], MD;  Location: [facility] OR;  Service: General;  Laterality: N/A;  . Colostomy  08/11/2012    Procedure: COLOSTOMY;  Surgeon: Cherylynn Ridges, MD;  Location: [facility] OR;  Service: General;  Laterality: N/A;  . Colostomy revision N/A 02/03/2013    Procedure: COLOSTOMY REVISION;  Surgeon: Cherylynn Ridges, MD;  Location: [facility] OR;  Service: General;  Laterality: N/A;  . Colostomy revision N/A 04/16/2013    Procedure: COLOSTOMY REVISION, REPAIR COLOSTOMY FISTULA;  Surgeon: Cherylynn Ridges, MD;  Location: [facility] OR;  Service: General;  Laterality: N/A;  . Laparotomy N/A 04/16/2013    Procedure:  EXPLORATORY LAPAROTOMY,Partial Colectomy,Small bowel resection;  Surgeon: Cherylynn Ridges, MD;  Location: [facility] OR;  Service: General;  Laterality: N/A;  . Appendectomy N/A 04/16/2013    Procedure: APPENDECTOMY;  Surgeon: Cherylynn Ridges, MD;  Location: [facility] OR;  Service: General;  Laterality: N/A;    Family History  Problem Relation Age of Onset  . Hypertension Mother   . Healthy Father   . Healthy Brother   . Healthy Brother     Social History:  reports that she quit smoking about 15 months ago. Her smoking use included Cigarettes. She has a .25 pack-year smoking history. She has never used smokeless tobacco. She reports that  drinks alcohol. She reports that she uses illicit drugs (Marijuana).  Allergies:  Allergies  Allergen Reactions  . Penicillins Other (See Comments)    "anything w/penicillin in it makes me bleed"  . Labetalol Hcl Itching and Other (See Comments)    Bumps to bilateral arms.  . Morphine And Related Hives    Medications:  Scheduled Meds: . multivitamin with minerals  1 tablet Oral Daily  . ondansetron  4 mg Oral Q6H  . potassium chloride  40 mEq Oral BID  . sodium chloride  3 mL Intravenous Q12H   Continuous Infusions:  PRN Meds:.acetaminophen, acetaminophen, ondansetron (ZOFRAN) IV, ondansetron, oxyCODONE, oxyCODONE-acetaminophen   Results for orders placed during the hospital encounter of 05/17/13 (from the past 48 hour(s))  CBC WITH DIFFERENTIAL     Status: Abnormal   Collection Time    05/17/13  6:28 PM      Result Value Range   WBC 9.5  4.0 - 10.5 K/uL   RBC 3.83 (*) 3.87 - 5.11 MIL/uL   Hemoglobin 10.2 (*) 12.0 - 15.0 g/dL   HCT 40.9 (*) 81.1 - 91.4 %   MCV 79.9  78.0 - 100.0 fL   MCH 26.6  26.0 - 34.0 pg   MCHC 33.3  30.0 - 36.0 g/dL   RDW 78.2 (*) 95.6 - 21.3 %   Platelets 426 (*) 150 - 400 K/uL   Neutrophils Relative % 63  43 - 77 %   Neutro Abs 6.0  1.7 - 7.7 K/uL   Lymphocytes Relative 26  12 - 46 %   Lymphs Abs 2.5  0.7 - 4.0 K/uL    Monocytes Relative 8  3 - 12 %   Monocytes Absolute 0.7  0.1 - 1.0 K/uL   Eosinophils Relative 3  0 - 5 %   Eosinophils Absolute 0.3  0.0 - 0.7 K/uL   Basophils Relative 0  0 - 1 %   Basophils Absolute 0.0  0.0 - 0.1 K/uL  BASIC METABOLIC PANEL     Status: Abnormal   Collection Time    05/17/13  6:28 PM      Result Value Range   Sodium 139  135 - 145 mEq/L  Potassium 2.7 (*) 3.5 - 5.1 mEq/L   Comment: CRITICAL RESULT CALLED TO, READ BACK BY AND VERIFIED WITH:     Chase Picket 1914 05/17/13 WBOND   Chloride 104  96 - 112 mEq/L   CO2 21  19 - 32 mEq/L   Glucose, Bld 90  70 - 99 mg/dL   BUN 10  6 - 23 mg/dL   Creatinine, Ser 7.82  0.50 - 1.10 mg/dL   Calcium 95.6 (*) 8.4 - 10.5 mg/dL   GFR calc non Af Amer 88 (*) >90 mL/min   GFR calc Af Amer >90  >90 mL/min   Comment: (NOTE)     The eGFR has been calculated using the CKD EPI equation.     This calculation has not been validated in all clinical situations.     eGFR's persistently <90 mL/min signify possible Chronic Kidney     Disease.  PRO B NATRIURETIC PEPTIDE     Status: Abnormal   Collection Time    05/17/13  6:31 PM      Result Value Range   Pro B Natriuretic peptide (BNP) 826.2 (*) 0 - 125 pg/mL  D-DIMER, QUANTITATIVE     Status: Abnormal   Collection Time    05/17/13  7:02 PM      Result Value Range   D-Dimer, Quant 0.77 (*) 0.00 - 0.48 ug/mL-FEU   Comment:            AT THE INHOUSE ESTABLISHED CUTOFF     VALUE OF 0.48 ug/mL FEU,     THIS ASSAY HAS BEEN DOCUMENTED     IN THE LITERATURE TO HAVE     A SENSITIVITY AND NEGATIVE     PREDICTIVE VALUE OF AT LEAST     98 TO 99%.  THE TEST RESULT     SHOULD BE CORRELATED WITH     AN ASSESSMENT OF THE CLINICAL     PROBABILITY OF DVT / VTE.  URINALYSIS, ROUTINE W REFLEX MICROSCOPIC     Status: Abnormal   Collection Time    05/17/13  8:30 PM      Result Value Range   Color, Urine YELLOW  YELLOW   APPearance TURBID (*) CLEAR   Specific Gravity, Urine >1.030 (*) 1.005 -  1.030   pH 7.0  5.0 - 8.0   Glucose, UA NEGATIVE  NEGATIVE mg/dL   Hgb urine dipstick LARGE (*) NEGATIVE   Bilirubin Urine NEGATIVE  NEGATIVE   Ketones, ur NEGATIVE  NEGATIVE mg/dL   Protein, ur >213 (*) NEGATIVE mg/dL   Urobilinogen, UA 1.0  0.0 - 1.0 mg/dL   Nitrite POSITIVE (*) NEGATIVE   Leukocytes, UA TRACE (*) NEGATIVE  URINE RAPID DRUG SCREEN (HOSP PERFORMED)     Status: Abnormal   Collection Time    05/17/13  8:30 PM      Result Value Range   Opiates NONE DETECTED  NONE DETECTED   Cocaine NONE DETECTED  NONE DETECTED   Benzodiazepines NONE DETECTED  NONE DETECTED   Amphetamines NONE DETECTED  NONE DETECTED   Tetrahydrocannabinol POSITIVE (*) NONE DETECTED   Barbiturates NONE DETECTED  NONE DETECTED   Comment:            DRUG SCREEN FOR MEDICAL PURPOSES     ONLY.  IF CONFIRMATION IS NEEDED     FOR ANY PURPOSE, NOTIFY LAB     WITHIN 5 DAYS.  LOWEST DETECTABLE LIMITS     FOR URINE DRUG SCREEN     Drug Class       Cutoff (ng/mL)     Amphetamine      1000     Barbiturate      200     Benzodiazepine   200     Tricyclics       300     Opiates          300     Cocaine          300     THC              50  URINE MICROSCOPIC-ADD ON     Status: None   Collection Time    05/17/13  8:30 PM      Result Value Range   WBC, UA 0-2  <3 WBC/hpf   RBC / HPF TOO NUMEROUS TO COUNT  <3 RBC/hpf   Bacteria, UA RARE  RARE   Urine-Other URINALYSIS PERFORMED ON SUPERNATANT     Comment: GROSSLY BLOODY SPECIMEN  POTASSIUM     Status: Abnormal   Collection Time    05/17/13  8:35 PM      Result Value Range   Potassium 2.6 (*) 3.5 - 5.1 mEq/L   Comment: CRITICAL RESULT CALLED TO, READ BACK BY AND VERIFIED WITH:     MITCHELL [facility] 05/17/13 2119 WAYK  MAGNESIUM     Status: None   Collection Time    05/17/13  9:20 PM      Result Value Range   Magnesium 1.8  1.5 - 2.5 mg/dL  POCT I-STAT TROPONIN I     Status: None   Collection Time    05/17/13 11:18 PM      Result Value  Range   Troponin i, poc 0.00  0.00 - 0.08 ng/mL   Comment 3            Comment: Due to the release kinetics of cTnI,     a negative result within the first hours     of the onset of symptoms does not rule out     myocardial infarction with certainty.     If myocardial infarction is still suspected,     repeat the test at appropriate intervals.  POTASSIUM     Status: Abnormal   Collection Time    05/18/13 12:20 AM      Result Value Range   Potassium 3.4 (*) 3.5 - 5.1 mEq/L   Comment: DELTA CHECK NOTED  MAGNESIUM     Status: None   Collection Time    05/18/13  5:35 AM      Result Value Range   Magnesium 1.6  1.5 - 2.5 mg/dL  BASIC METABOLIC PANEL     Status: Abnormal   Collection Time    05/18/13  5:35 AM      Result Value Range   Sodium 140  135 - 145 mEq/L   Potassium 3.2 (*) 3.5 - 5.1 mEq/L   Chloride 111  96 - 112 mEq/L   CO2 20  19 - 32 mEq/L   Glucose, Bld 133 (*) 70 - 99 mg/dL   BUN 10  6 - 23 mg/dL   Creatinine, Ser 4.09  0.50 - 1.10 mg/dL   Calcium 9.4  8.4 - 81.1 mg/dL   GFR calc non Af Amer >90  >90 mL/min   GFR calc Af Amer >90  >90 mL/min  Comment: (NOTE)     The eGFR has been calculated using the CKD EPI equation.     This calculation has not been validated in all clinical situations.     eGFR's persistently <90 mL/min signify possible Chronic Kidney     Disease.  CBC     Status: Abnormal   Collection Time    05/18/13  5:35 AM      Result Value Range   WBC 7.8  4.0 - 10.5 K/uL   RBC 3.02 (*) 3.87 - 5.11 MIL/uL   Hemoglobin 8.1 (*) 12.0 - 15.0 g/dL   Comment: REPEATED TO VERIFY   HCT 24.4 (*) 36.0 - 46.0 %   MCV 80.8  78.0 - 100.0 fL   MCH 26.8  26.0 - 34.0 pg   MCHC 33.2  30.0 - 36.0 g/dL   RDW 78.2 (*) 95.6 - 21.3 %   Platelets 359  150 - 400 K/uL    Ct Angio Chest Pe W/cm &/or Wo Cm  05/17/2013   *RADIOLOGY REPORT*  Clinical Data: Elevated D-dimer, abdominal pain  CT ANGIOGRAPHY CHEST,CT ABDOMEN AND PELVIS WITH CONTRAST  Technique:  Multidetector  CT imaging of the chest using the standard protocol during bolus administration of intravenous contrast. Multiplanar reconstructed images including MIPs were obtained and reviewed to evaluate the vascular anatomy.,Technique: Mul  Contrast: 80mL OMNIPAQUE IOHEXOL 350 MG/ML SOLN  Comparison: 05/04/2013 abdominal CT, 08/06/2010 PET CT  CHEST: Findings: Pulmonary arteries are patent.  Normal caliber aorta and branch vessels.  Cardiomegaly.  No pleural or pericardial effusion. Mildly prominent left axillary lymph node measuring 1 cm short axis on image 93 series 6. This has developed since the prior PET CT. Otherwise, no intrathoracic lymphadenopathy.  Central airways are patent.  No pneumothorax.  Linear opacity within the right lower lobe, favored to reflect atelectasis or scar.  Unchanged 3 mm nodule right lung base, may be vascular. No acute osseous finding.  IMPRESSION: No pulmonary embolism or acute intrathoracic process.  Nonspecific enlarged left axillary lymph node.  Consider tissue sampling.  Mild cardiomegaly/left ventricular enlargement.  Correlate with echocardiogram.  ABDOMEN/PELVIS Findings: Unremarkable liver, spleen, pancreas.  Cholecystectomy. No biliary ductal dilatation.  Unremarkable adrenal glands.  Symmetric renal enhancement.  Nonobstructing lower pole stone on the left.  Pancolitis and left lower quadrant colostomy.  No bowel obstruction.  Surgical drain terminates within the midline anterior abdomen.  Complex peripherally enhancing collection within the pelvis tracks superiorly, inseparable from adjacent loops of bowel. The amount of extraluminal gas has decreased.  Surgical anastomotic suture right lower quadrant. Open midline laparotomy.  Normal caliber aorta and branch vessels.  Circumferential bladder wall thickening.  Intraluminal high attenuation presumably reflects clot.  Uterus is presumably absent with irregularly enhancing soft tissue within the pelvis, similar to prior.  No interval  osseous change.  IMPRESSION:  Postoperative changes of left lower quadrant colostomy. Nonspecific pancolitis.  No small bowel obstruction.  Pelvic abscess persists though has decreased in size, with the largest component measuring 2.1 x 1.1 cm within the left anterior pelvis.  Circumferential bladder wall thickening.  Interval intraluminal high attenuation presumably reflects blood clot.  Correlate with urinalysis.  May reflect cystitis including radiation cystitis.  Uterus is presumably absent with irregularly enhancing soft tissue within the pelvis, similar to prior.   Original Report Authenticated By: Jearld Lesch, M.D.   Ct Abdomen Pelvis W Contrast  05/17/2013   *RADIOLOGY REPORT*  Clinical Data: Elevated D-dimer, abdominal pain  CT ANGIOGRAPHY CHEST,CT ABDOMEN  AND PELVIS WITH CONTRAST  Technique:  Multidetector CT imaging of the chest using the standard protocol during bolus administration of intravenous contrast. Multiplanar reconstructed images including MIPs were obtained and reviewed to evaluate the vascular anatomy.,Technique: Mul  Contrast: 80mL OMNIPAQUE IOHEXOL 350 MG/ML SOLN  Comparison: 05/04/2013 abdominal CT, 08/06/2010 PET CT  CHEST: Findings: Pulmonary arteries are patent.  Normal caliber aorta and branch vessels.  Cardiomegaly.  No pleural or pericardial effusion. Mildly prominent left axillary lymph node measuring 1 cm short axis on image 93 series 6. This has developed since the prior PET CT. Otherwise, no intrathoracic lymphadenopathy.  Central airways are patent.  No pneumothorax.  Linear opacity within the right lower lobe, favored to reflect atelectasis or scar.  Unchanged 3 mm nodule right lung base, may be vascular. No acute osseous finding.  IMPRESSION: No pulmonary embolism or acute intrathoracic process.  Nonspecific enlarged left axillary lymph node.  Consider tissue sampling.  Mild cardiomegaly/left ventricular enlargement.  Correlate with echocardiogram.  ABDOMEN/PELVIS  Findings: Unremarkable liver, spleen, pancreas.  Cholecystectomy. No biliary ductal dilatation.  Unremarkable adrenal glands.  Symmetric renal enhancement.  Nonobstructing lower pole stone on the left.  Pancolitis and left lower quadrant colostomy.  No bowel obstruction.  Surgical drain terminates within the midline anterior abdomen.  Complex peripherally enhancing collection within the pelvis tracks superiorly, inseparable from adjacent loops of bowel. The amount of extraluminal gas has decreased.  Surgical anastomotic suture right lower quadrant. Open midline laparotomy.  Normal caliber aorta and branch vessels.  Circumferential bladder wall thickening.  Intraluminal high attenuation presumably reflects clot.  Uterus is presumably absent with irregularly enhancing soft tissue within the pelvis, similar to prior.  No interval osseous change.  IMPRESSION:  Postoperative changes of left lower quadrant colostomy. Nonspecific pancolitis.  No small bowel obstruction.  Pelvic abscess persists though has decreased in size, with the largest component measuring 2.1 x 1.1 cm within the left anterior pelvis.  Circumferential bladder wall thickening.  Interval intraluminal high attenuation presumably reflects blood clot.  Correlate with urinalysis.  May reflect cystitis including radiation cystitis.  Uterus is presumably absent with irregularly enhancing soft tissue within the pelvis, similar to prior.   Original Report Authenticated By: Jearld Lesch, M.D.    Review of Systems  Constitutional: Negative for fever and diaphoresis.  HENT: Negative for congestion and sore throat.   Respiratory: Negative for cough and shortness of breath.   Cardiovascular: Negative for chest pain, palpitations, orthopnea, leg swelling and PND.  Gastrointestinal: Positive for abdominal pain. Negative for nausea, vomiting, blood in stool and melena.  Genitourinary: Positive for hematuria (Only after presenting to the ER). Negative for  dysuria.  Neurological: Negative for dizziness.  All other systems reviewed and are negative.   Blood pressure 105/58, pulse 55, temperature 97.6 F (36.4 C), temperature source Oral, resp. rate 18, height 5\' 5"  (1.651 m), weight 114 lb 14.4 oz (52.118 kg), SpO2 100.00%. Physical Exam  Constitutional: She is oriented to person, place, and time. She appears well-developed and well-nourished. No distress.  HENT:  Head: Normocephalic and atraumatic.  Eyes: EOM are normal. Pupils are equal, round, and reactive to light. No scleral icterus.  Neck: Normal range of motion. Neck supple. No JVD present.  Cardiovascular: Normal rate, regular rhythm, S1 normal and S2 normal.   No murmur heard. Pulses:      Radial pulses are 2+ on the right side, and 2+ on the left side.       Dorsalis pedis  pulses are 2+ on the right side, and 2+ on the left side.  No Carotid Bruit  Respiratory: Effort normal and breath sounds normal. She has no wheezes. She has no rales.  GI: Soft. Bowel sounds are normal. She exhibits no distension.  Musculoskeletal:  No LEE   Lymphadenopathy:    She has no cervical adenopathy.  Neurological: She is alert and oriented to person, place, and time. She exhibits normal muscle tone.  Skin: Skin is warm and dry.  Psychiatric: She has a normal mood and affect.    Assessment/Plan: Principal Problem:   Syncope Active Problems:   Hypokalemia   NICM EF of 20% by echo 01/2013-improved 35-40% 05/07/13  Plan:   37 yo female presenting after a syncopal event with complicated PMH which includes NICM. EF that has improved from 20% to 35-40%.   She reports new hematuria since arriving at the ER and her Hgb dropped from 10.2 to 8.1.  CT abd/pelvis findings May reflect cystitis including radiation cystitis.   Potassium was also low at 2.7 initially and is now 3.2.  More K+ just given.  She has not had any events on telemetry whch would explain a syncopal event.  Possible vasovagal/  arrhythmogenic cause.  BP and HR stable.    MD to follow.  HAGER, BRYAN 05/18/2013, 10:13 AM      Agree with note written by Jones Skene [facility]  Admitted with witnessed syncope. NISCM. EF 35-40%. Other prob as outlined. No Sz activity. Exam benign. HGB low secondary hematuria. ? Need for ICD vs event monitor. No driving for 6 months. Will get EP to see and evaluate. No arrhthymias.on tele.Runell Gess 05/18/2013 6:25 PM

## 2013-05-18 NOTE — Progress Notes (Signed)
Vanessa [name] ZOX:[id number] DOB: 07-25-1976 DOA: 05/17/2013 PCP: Dorrene German, MD  Brief narrative: Unfrotunate 37 y/o ? , known Cerv Ca, s/p Rad therapy 2013 + Rad colitis, Htn, Eclampsia, Colostomy + revision and Cholecystectomy, recent admission for reversal, complicated bylarge intra-abd fluid/Gas=Abd abcess + possible SB dehiscence.  Hospital course complicated by Hypotension, But Echo rpt 8/14 sig better [went from 20%-40-45%] found to be improved and d/c home. Patient presented to the hospital 05/17/2013 with syncopal episode and was noted to have clots in her urine and dark urine. Her hemoglobin has been stable around 8-9 range since admission  Past medical history-As per Problem list Chart reviewed as below-  Admission 04/16/13 for repair of ostomy, noted durhter abcesses as above-to see Dr. [name] 9/14  Admission 02/03/13 with prolapsed ischemic colostomy status post revision, DVT at that time noted-had an enterocolonic fisutla as well  addressed at subsequent hospitalization as above  Admission 5 8 14  with left lower quadrant pain   Admission 08/11/12 for ileostomy reversal of frozen pelvis did not allow this and had ileostomy converted 2 colostomy  Admission 03/21/2012 with small bowel obstruction  Admission 03/16/2012 small bowel obstruction vs. Ileus  Admission 5/6/013 for colon stricture colonoscopy with high-grade stricture-status post colon resection and sigmoid diverting loop ileostomy 02/04/2012-had complications including , Infection, abdominal abscesses, wound care issues-hospital service, ID, hematology was involved in her care  Admission 10/02/2010 for repeat Brachytherapy in the management of cervical cancer  Admission 08/18/2008 postpartum preeclampsia  Consultants:  Wound nurse  [facility]  Urology-to see  Procedures:  CT chest  CT abd pelvis  Antibiotics:  none   Subjective  Doing well. States she wants to go home.  Still having hematuria and clots. No nausea no vomiting no chest pain no shortness does not feel really dizzy right now  stated that the dizzy feeling occurred yesterday while being seated and has not recurred since. No dysuria. States abscess drain output is ~ 50cc day   Objective    Interim History: none  Telemetry: SInus brady, no arrythmia or reds otherwise  Objective: Filed Vitals:   05/18/13 0000 05/18/13 0015 05/18/13 0103 05/18/13 0500  BP: 121/82 118/82 137/78 105/58  Pulse: 59 59 57 55  Temp:   97.8 F (36.6 C) 97.6 F (36.4 C)  TempSrc:      Resp: 11 10 18 18   Height:   5\' 5"  (1.651 m)   Weight:   52.118 kg (114 lb 14.4 oz)   SpO2: 100% 100% 100% 100%    Intake/Output Summary (Last 24 hours) at 05/18/13 8119 Last data filed at 05/18/13 0454  Gross per 24 hour  Intake   1240 ml  Output    361 ml  Net    879 ml    Exam:  General: Alert pleasant poor nutrition, mod build. Cardiovascular: S1-S2 no noted murmur or gallop Respiratory: Clinically clear Abdomen: Stoma noted in abdomen nontender nd and, abscess drain Skin see above Neuro intact moving all 4 limbs  Data Reviewed: Basic Metabolic Panel:  Recent Labs Lab 05/17/13 1828  05/17/13 2120 05/18/13 0020 05/18/13 0535  NA 139  --   --   --  140  K 2.7*  < >  --  3.4* 3.2*  CL 104  --   --   --  111  CO2 21  --   --   --  20  GLUCOSE 90  --   --   --  133*  BUN 10  --   --   --  10  CREATININE 0.84  --   --   --  0.74  CALCIUM 10.8*  --   --   --  9.4  MG  --   --  1.8  --  1.6  < > = values in this interval not displayed. Liver Function Tests: No results found for this basename: AST, ALT, ALKPHOS, BILITOT, PROT, ALBUMIN,  in the last 168 hours No results found for this basename: LIPASE, AMYLASE,  in the last 168 hours No results found for this basename: AMMONIA,  in the last 168 hours CBC:  Recent Labs Lab 05/17/13 1828 05/18/13 0535  WBC 9.5 7.8  NEUTROABS 6.0  --   HGB 10.2* 8.1*   HCT 30.6* 24.4*  MCV 79.9 80.8  PLT 426* 359   Cardiac Enzymes: No results found for this basename: CKTOTAL, CKMB, CKMBINDEX, TROPONINI,  in the last 168 hours BNP: No components found with this basename: POCBNP,  CBG: No results found for this basename: GLUCAP,  in the last 168 hours  No results found for this or any previous visit (from the past 240 hour(s)).   Studies:              All Imaging reviewed and is as per above notation   Scheduled Meds: . multivitamin with minerals  1 tablet Oral Daily  . ondansetron  4 mg Oral Q6H  . potassium chloride  40 mEq Oral BID  . sodium chloride  3 mL Intravenous Q12H   Continuous Infusions:    Assessment/Plan: 1. Syncope-differential diagnosis of sinus arrhythmia vs. acute blood loss anemia-more likely loss. Will check blood count every 8 hourly, if drops below 7 consider transfusion packed red blood cells-cardiology consult on board 2. Gross hematuria with clots-have called Dr Patsi Sears with regards to this complaint to determine if there any options that we may have.  Her radiation oncologist is Dr. Kathrynn Running and she completed radiation in 2012 3. Multiple surgical issues status post multiple complex surgery-will revisit surgical issues as outpatient with Dr. [name] right now this is a quiescent problem 4. Hypokalemia-replace orally K. Dur 40 mEq bid 5. H/o stage IB-2 squamous cell CA cancer, status post brachy therapy-currently stable-needs outpatient surveillance with oncology/Radiation oncologist-used to see Dr. Darrold Span. 6. H/o DVT-Received Anticoagulation for about 8 weeks and then this was discontinued as no further clot was noted-is a relative contraindication at this time 7. Nonischemic cardiomyopathy with most recent echo showing EF 30-45%, cardiac MRI apparently last visit showed no structural anomaly-appreciate cardiology input-unclear if needs cardiac medications as her course has been complicated by hypotension see above  #1 8. Hypertension-in this case probably more related to anemia of blood loss, follow blood pressure outpatient to determine need for cardiac meds 9. Ostomy-wound care nurse consulting  Code Status: Full Family Communication: Discussed with family at bedside Disposition Plan: Inpatient   Pleas Koch, MD  [facility] Pager [phone] 05/18/2013, 9:38 AM    LOS: 1 day

## 2013-05-18 NOTE — H&P (Signed)
[facility] History and Physical  Vanessa [name] ION:[id number] DOB: 1976/04/24 DOA: 05/17/2013  Referring physician: ER physician. PCP: Dorrene German, MD  Specialists: [facility].  Chief Complaint: Loss of consciousness.  HPI: Vanessa [name] is a 37 y.o. female history of nonischemic cardiomyopathy with last EF measured this month was 35-40% presents to the ER because patient lost consciousness. Patient states she was sitting in the kitchen with her family and suddenly she lost consciousness for a few seconds. Patient did not have any palpitations chest pain or shortness of breath. Denies any focal deficits seizure-like activities or incontinence of urine or tongue bite. In the ER patient was found to have hypokalemia with EKG showing sinus tachycardia. Patient has been admitted for further observation. Patient was recently admitted to the hospital for abdominal abscess. 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 CT abdomen and pelvis along with CT chest was done as patient was complaining of abdominal discomfort. CT shows features concerning for cystitis. Patient does have hematuria with blood clots in the urine.  Patient's cardiac medications were discontinued recent admission due to hypotension.  Review of Systems: As presented in the history of presenting illness, rest negative.  Past Medical History  Diagnosis Date  . History of blood transfusion     "I had ~ 7 in 01/2012"  . Anemia   . Radiation Nov.18,2011-Jan.23,2012    cervix  . Hypertension     no treatment necessary for BP  since July 2013  . Non-ischemic cardiomyopathy, EF 25 % 02/14/2013  . At risk for sudden cardiac death 03/13/2013    hs life vest  . CHF (congestive heart failure)   . Dysrhythmia     WEARS LIFE VEST (DR. C [facility])  . Cervical cancer ~ 2011    cervical   Past Surgical History  Procedure Laterality Date  .  Radiation implants  ~ 2011    "for cervical cancer"  . Colonoscopy  01/30/2012    Procedure: COLONOSCOPY;  Surgeon: Beverley Fiedler, MD;  Location: [facility] ENDOSCOPY;  Service: Gastroenterology;  Laterality: N/A;  . Colonoscopy  01/30/2012    Procedure: COLONOSCOPY;  Surgeon: Beverley Fiedler, MD;  Location: [facility] OR;  Service: Gastroenterology;  Laterality: N/A;  . Colostomy revision  02/04/2012    Procedure: COLON RESECTION SIGMOID;  Surgeon: Cherylynn Ridges, MD;  Location: [facility] OR;  Service: General;  Laterality: N/A;  . Cholecystectomy  2006  . Laparotomy  08/11/2012    Procedure: EXPLORATORY LAPAROTOMY;  Surgeon: Cherylynn Ridges, MD;  Location: [facility] OR;  Service: General;  Laterality: N/A;  . Laparoscopic lysis of adhesions  08/11/2012    Procedure: LAPAROSCOPIC LYSIS OF ADHESIONS;  Surgeon: Cherylynn Ridges, MD;  Location: [facility] OR;  Service: General;  Laterality: N/A;  . Bowel resection  08/11/2012    Procedure: SMALL BOWEL RESECTION;  Surgeon: Cherylynn Ridges, MD;  Location: [facility] OR;  Service: General;  Laterality: N/A;  . Colostomy  08/11/2012    Procedure: COLOSTOMY;  Surgeon: Cherylynn Ridges, MD;  Location: [facility] OR;  Service: General;  Laterality: N/A;  . Colostomy revision N/A 02/03/2013    Procedure: COLOSTOMY REVISION;  Surgeon: Cherylynn Ridges, MD;  Location: [facility] OR;  Service: General;  Laterality: N/A;  . Colostomy revision N/A 04/16/2013    Procedure: COLOSTOMY REVISION, REPAIR COLOSTOMY FISTULA;  Surgeon: Cherylynn Ridges, MD;  Location: [facility] OR;  Service: General;  Laterality: N/A;  .  Laparotomy N/A 04/16/2013    Procedure: EXPLORATORY LAPAROTOMY,Partial Colectomy,Small bowel resection;  Surgeon: Cherylynn Ridges, MD;  Location: [facility] OR;  Service: General;  Laterality: N/A;  . Appendectomy N/A 04/16/2013    Procedure: APPENDECTOMY;  Surgeon: Cherylynn Ridges, MD;  Location: [facility] OR;  Service: General;  Laterality: N/A;   Social History:  reports that she quit smoking about 15 months ago. Her smoking use included Cigarettes. She has a  .25 pack-year smoking history. She has never used smokeless tobacco. She reports that  drinks alcohol. She reports that she uses illicit drugs (Marijuana). Home. where does patient live-- Can do ADLs. Can patient participate in ADLs?  Allergies  Allergen Reactions  . Penicillins Other (See Comments)    "anything w/penicillin in it makes me bleed"  . Labetalol Hcl Itching and Other (See Comments)    Bumps to bilateral arms.  . Morphine And Related Hives    Family History  Problem Relation Age of Onset  . Hypertension Mother   . Healthy Father   . Healthy Brother   . Healthy Brother       Prior to Admission medications   Medication Sig Start Date End Date Taking? Authorizing Provider  Multiple Vitamin (MULTIVITAMIN WITH MINERALS) TABS Take 1 tablet by mouth daily.   Yes Historical Provider, MD  ondansetron (ZOFRAN) 4 MG tablet Take 1 tablet (4 mg total) by mouth every 6 (six) hours. 05/09/13  Yes Cherylynn Ridges, MD  oxyCODONE-acetaminophen (PERCOCET) 10-325 MG per tablet Take 1 tablet by mouth every 4 (four) hours as needed for pain. 05/09/13  Yes Cherylynn Ridges, MD   Physical Exam: Filed Vitals:   05/17/13 2345 05/18/13 0000 05/18/13 0015 05/18/13 0103  BP: 114/80 121/82 118/82 137/78  Pulse: 58 59 59 57  Temp:    97.8 F (36.6 C)  TempSrc:      Resp: 22 11 10 18   Height:    5\' 5"  (1.651 m)  Weight:    52.118 kg (114 lb 14.4 oz)  SpO2: 100% 100% 100% 100%     General:  Well-developed and moderately nourished.  Eyes: Anicteric no pallor.  ENT: No discharge from the ears eyes nose mouth.  Neck: No mass felt.  Cardiovascular: S1-S2 heard.  Respiratory: No rhonchi or crepitations.  Abdomen: Soft nontender. Colostomy bag seen.  Skin: No rash.  Musculoskeletal: No edema.  Psychiatric: Appears normal.  Neurologic: Alert awake oriented to time place and person. Moves all extremities.  Labs on Admission:  Basic Metabolic Panel:  Recent Labs Lab 05/17/13 1828  05/17/13 2035 05/17/13 2120 05/18/13 0020  NA 139  --   --   --   K 2.7* 2.6*  --  3.4*  CL 104  --   --   --   CO2 21  --   --   --   GLUCOSE 90  --   --   --   BUN 10  --   --   --   CREATININE 0.84  --   --   --   CALCIUM 10.8*  --   --   --   MG  --   --  1.8  --    Liver Function Tests: No results found for this basename: AST, ALT, ALKPHOS, BILITOT, PROT, ALBUMIN,  in the last 168 hours No results found for this basename: LIPASE, AMYLASE,  in the last 168 hours No results found for this basename: AMMONIA,  in the last 168  hours CBC:  Recent Labs Lab 05/17/13 1828  WBC 9.5  NEUTROABS 6.0  HGB 10.2*  HCT 30.6*  MCV 79.9  PLT 426*   Cardiac Enzymes: No results found for this basename: CKTOTAL, CKMB, CKMBINDEX, TROPONINI,  in the last 168 hours  BNP (last 3 results)  Recent Labs  02/17/13 0500 05/07/13 0930 05/17/13 1831  PROBNP 1234.0* 34.8 826.2*   CBG: No results found for this basename: GLUCAP,  in the last 168 hours  Radiological Exams on Admission: Ct Angio Chest Pe W/cm &/or Wo Cm  05/17/2013   *RADIOLOGY REPORT*  Clinical Data: Elevated D-dimer, abdominal pain  CT ANGIOGRAPHY CHEST,CT ABDOMEN AND PELVIS WITH CONTRAST  Technique:  Multidetector CT imaging of the chest using the standard protocol during bolus administration of intravenous contrast. Multiplanar reconstructed images including MIPs were obtained and reviewed to evaluate the vascular anatomy.,Technique: Mul  Contrast: 80mL OMNIPAQUE IOHEXOL 350 MG/ML SOLN  Comparison: 05/04/2013 abdominal CT, 08/06/2010 PET CT  CHEST: Findings: Pulmonary arteries are patent.  Normal caliber aorta and branch vessels.  Cardiomegaly.  No pleural or pericardial effusion. Mildly prominent left axillary lymph node measuring 1 cm short axis on image 93 series 6. This has developed since the prior PET CT. Otherwise, no intrathoracic lymphadenopathy.  Central airways are patent.  No pneumothorax.  Linear opacity within the  right lower lobe, favored to reflect atelectasis or scar.  Unchanged 3 mm nodule right lung base, may be vascular. No acute osseous finding.  IMPRESSION: No pulmonary embolism or acute intrathoracic process.  Nonspecific enlarged left axillary lymph node.  Consider tissue sampling.  Mild cardiomegaly/left ventricular enlargement.  Correlate with echocardiogram.  ABDOMEN/PELVIS Findings: Unremarkable liver, spleen, pancreas.  Cholecystectomy. No biliary ductal dilatation.  Unremarkable adrenal glands.  Symmetric renal enhancement.  Nonobstructing lower pole stone on the left.  Pancolitis and left lower quadrant colostomy.  No bowel obstruction.  Surgical drain terminates within the midline anterior abdomen.  Complex peripherally enhancing collection within the pelvis tracks superiorly, inseparable from adjacent loops of bowel. The amount of extraluminal gas has decreased.  Surgical anastomotic suture right lower quadrant. Open midline laparotomy.  Normal caliber aorta and branch vessels.  Circumferential bladder wall thickening.  Intraluminal high attenuation presumably reflects clot.  Uterus is presumably absent with irregularly enhancing soft tissue within the pelvis, similar to prior.  No interval osseous change.  IMPRESSION:  Postoperative changes of left lower quadrant colostomy. Nonspecific pancolitis.  No small bowel obstruction.  Pelvic abscess persists though has decreased in size, with the largest component measuring 2.1 x 1.1 cm within the left anterior pelvis.  Circumferential bladder wall thickening.  Interval intraluminal high attenuation presumably reflects blood clot.  Correlate with urinalysis.  May reflect cystitis including radiation cystitis.  Uterus is presumably absent with irregularly enhancing soft tissue within the pelvis, similar to prior.   Original Report Authenticated By: Jearld Lesch, M.D.   Ct Abdomen Pelvis W Contrast  05/17/2013   *RADIOLOGY REPORT*  Clinical Data: Elevated  D-dimer, abdominal pain  CT ANGIOGRAPHY CHEST,CT ABDOMEN AND PELVIS WITH CONTRAST  Technique:  Multidetector CT imaging of the chest using the standard protocol during bolus administration of intravenous contrast. Multiplanar reconstructed images including MIPs were obtained and reviewed to evaluate the vascular anatomy.,Technique: Mul  Contrast: 80mL OMNIPAQUE IOHEXOL 350 MG/ML SOLN  Comparison: 05/04/2013 abdominal CT, 08/06/2010 PET CT  CHEST: Findings: Pulmonary arteries are patent.  Normal caliber aorta and branch vessels.  Cardiomegaly.  No pleural or pericardial effusion. Mildly  prominent left axillary lymph node measuring 1 cm short axis on image 93 series 6. This has developed since the prior PET CT. Otherwise, no intrathoracic lymphadenopathy.  Central airways are patent.  No pneumothorax.  Linear opacity within the right lower lobe, favored to reflect atelectasis or scar.  Unchanged 3 mm nodule right lung base, may be vascular. No acute osseous finding.  IMPRESSION: No pulmonary embolism or acute intrathoracic process.  Nonspecific enlarged left axillary lymph node.  Consider tissue sampling.  Mild cardiomegaly/left ventricular enlargement.  Correlate with echocardiogram.  ABDOMEN/PELVIS Findings: Unremarkable liver, spleen, pancreas.  Cholecystectomy. No biliary ductal dilatation.  Unremarkable adrenal glands.  Symmetric renal enhancement.  Nonobstructing lower pole stone on the left.  Pancolitis and left lower quadrant colostomy.  No bowel obstruction.  Surgical drain terminates within the midline anterior abdomen.  Complex peripherally enhancing collection within the pelvis tracks superiorly, inseparable from adjacent loops of bowel. The amount of extraluminal gas has decreased.  Surgical anastomotic suture right lower quadrant. Open midline laparotomy.  Normal caliber aorta and branch vessels.  Circumferential bladder wall thickening.  Intraluminal high attenuation presumably reflects clot.  Uterus is  presumably absent with irregularly enhancing soft tissue within the pelvis, similar to prior.  No interval osseous change.  IMPRESSION:  Postoperative changes of left lower quadrant colostomy. Nonspecific pancolitis.  No small bowel obstruction.  Pelvic abscess persists though has decreased in size, with the largest component measuring 2.1 x 1.1 cm within the left anterior pelvis.  Circumferential bladder wall thickening.  Interval intraluminal high attenuation presumably reflects blood clot.  Correlate with urinalysis.  May reflect cystitis including radiation cystitis.  Uterus is presumably absent with irregularly enhancing soft tissue within the pelvis, similar to prior.   Original Report Authenticated By: Jearld Lesch, M.D.    EKG: Independently reviewed. Sinus tachycardia.  Assessment/Plan Principal Problem:   Syncope Active Problems:   Hypokalemia   NICM EF of 20% by echo 01/2013-improved 35-40% 05/07/13   1. Syncope with history of nonischemic cardiomyopathy in the setting of hypokalemia - replace potassium and closely follow metabolic panel. Observe in telemetry for any arrhythmias. Until recently patient was on life vest. Consult cardiology in a.m. 2. Hematuria with cystitis - probably radiation-induced. I have not started any antibiotics until cultures available. Closely follow CBC. 3. 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    Code Status: Full code.  Family Communication: None.  Disposition Plan: Admit for observation.    KAKRAKANDY,ARSHAD N. [facility] Pager [phone].  If 7PM-7AM, please contact night-coverage [email or web address] Password [facility] 05/18/2013, 2:23 AM

## 2013-05-18 NOTE — Progress Notes (Signed)
[facility]  Patient Status: Active (receiving services up to time of hospitalization)  [facility] is providing the following services: RN  If patient discharges after hours, please call [phone].   Vanessa [name] 05/18/2013, 5:23 PM

## 2013-05-18 NOTE — Care Management Note (Signed)
    Page 1 of 1   05/21/2013     11:15:26 AM   CARE MANAGEMENT NOTE 05/21/2013  Patient:  FEDRA, LANTER   Account Number:  [id number]  Date Initiated:  05/18/2013  Documentation initiated by:  GRAVES-BIGELOW,Tamara Kenyon  Subjective/Objective Assessment:   Pt admitted with syncope. Pt is active with [facility] for [facility]. Please write resumption orders for pt when d/c home for ostomy care. Pt is also active with [facility].     Action/Plan:   CM will continue to monitor for additional disposition needs   Anticipated DC Date:  05/19/2013   Anticipated DC Plan:  HOME W HOME HEALTH SERVICES      DC Planning Services  CM consult      [facility] Choice  Resumption Of Svcs/PTA Provider   Choice offered to / List presented to:          [facility] arranged  HH-1 RN  HH-10 DISEASE MANAGEMENT      HH agency  [facility]   Status of service:  Completed, signed off Medicare Important Message given?   (If response is "NO", the following Medicare IM given date fields will be blank) Date Medicare IM given:   Date Additional Medicare IM given:    Discharge Disposition:  HOME W HOME HEALTH SERVICES  Per UR Regulation:  Reviewed for med. necessity/level of care/duration of stay  If discussed at Long Length of Stay Meetings, dates discussed:    Comments:  05-21-13 [address], [place] [phone] EP to consult with pt. CM will continue to monitor for additional d/c needs. [facility] is aware of pt.

## 2013-05-19 ENCOUNTER — Ambulatory Visit (HOSPITAL_COMMUNITY): Admission: RE | Admit: 2013-05-19 | Payer: Medicaid Other | Source: Ambulatory Visit | Admitting: Urology

## 2013-05-19 ENCOUNTER — Encounter (HOSPITAL_COMMUNITY)
Admission: EM | Disposition: A | Discharge status: Home / Self Care | Payer: Self-pay | Source: Home / Self Care | Attending: Internal Medicine

## 2013-05-19 ENCOUNTER — Inpatient Hospital Stay (HOSPITAL_COMMUNITY): Payer: Medicaid Other | Admitting: Anesthesiology

## 2013-05-19 ENCOUNTER — Encounter (HOSPITAL_COMMUNITY): Payer: Self-pay | Admitting: Anesthesiology

## 2013-05-19 DIAGNOSIS — D62 Acute posthemorrhagic anemia: Secondary | ICD-10-CM

## 2013-05-19 DIAGNOSIS — R310 Gross hematuria: Secondary | ICD-10-CM

## 2013-05-19 DIAGNOSIS — C539 Malignant neoplasm of cervix uteri, unspecified: Secondary | ICD-10-CM

## 2013-05-19 HISTORY — PX: CYSTOSCOPY W/ RETROGRADES: SHX1426

## 2013-05-19 LAB — CBC
MCH: 27.3 pg (ref 26.0–34.0)
Platelets: 320 10*3/uL (ref 150–400)
RBC: 2.71 MIL/uL — ABNORMAL LOW (ref 3.87–5.11)
RDW: 19.5 % — ABNORMAL HIGH (ref 11.5–15.5)

## 2013-05-19 LAB — URINE CULTURE
Colony Count: NO GROWTH
Culture: NO GROWTH

## 2013-05-19 LAB — TYPE AND SCREEN: ABO/RH(D): A POS

## 2013-05-19 LAB — BASIC METABOLIC PANEL
CO2: 22 mEq/L (ref 19–32)
Calcium: 10 mg/dL (ref 8.4–10.5)
GFR calc Af Amer: >90 mL/min (ref >90)
GFR calc non Af Amer: >90 mL/min (ref >90)
Sodium: 137 mEq/L (ref 135–145)

## 2013-05-19 LAB — PREPARE RBC (CROSSMATCH)

## 2013-05-19 LAB — MAGNESIUM: Magnesium: 1.5 mg/dL (ref 1.5–2.5)

## 2013-05-19 SURGERY — CYSTOSCOPY, WITH RETROGRADE PYELOGRAM
Anesthesia: General | Site: Bladder | Laterality: Bilateral | Wound class: Clean Contaminated

## 2013-05-19 MED ORDER — LACTATED RINGERS IV SOLN: INTRAVENOUS | Status: DC

## 2013-05-19 MED ORDER — STERILE WATER FOR IRRIGATION IR SOLN
Status: DC | PRN
  Administered 2013-05-19: 4000 mL

## 2013-05-19 MED ORDER — PROPOFOL 10 MG/ML IV BOLUS
INTRAVENOUS | Status: DC | PRN
  Administered 2013-05-19: 100 mg via INTRAVENOUS

## 2013-05-19 MED ORDER — FENTANYL CITRATE 0.05 MG/ML IJ SOLN
INTRAMUSCULAR | Status: DC | PRN
  Administered 2013-05-19 (×3): 25 ug via INTRAVENOUS

## 2013-05-19 MED ORDER — LACTATED RINGERS IV SOLN
INTRAVENOUS | Status: DC
  Administered 2013-05-19: 13:00:00 via INTRAVENOUS
  Administered 2013-05-19: 1000 mL via INTRAVENOUS

## 2013-05-19 MED ORDER — PROMETHAZINE HCL 25 MG/ML IJ SOLN: 6.2500 mg | INTRAMUSCULAR | Status: DC | PRN

## 2013-05-19 MED ORDER — LIDOCAINE HCL 1 % IJ SOLN
INTRAMUSCULAR | Status: DC | PRN
  Administered 2013-05-19: 50 mg via INTRADERMAL

## 2013-05-19 MED ORDER — FENTANYL CITRATE 0.05 MG/ML IJ SOLN: 25.0000 ug | INTRAMUSCULAR | Status: DC | PRN

## 2013-05-19 MED ORDER — MEPERIDINE HCL 50 MG/ML IJ SOLN: 6.2500 mg | INTRAMUSCULAR | Status: DC | PRN

## 2013-05-19 MED ORDER — MIDAZOLAM HCL 5 MG/5ML IJ SOLN
INTRAMUSCULAR | Status: DC | PRN
  Administered 2013-05-19: 2 mg via INTRAVENOUS

## 2013-05-19 MED ORDER — CIPROFLOXACIN IN D5W 400 MG/200ML IV SOLN
400.0000 mg | INTRAVENOUS | Status: DC
  Filled 2013-05-19: qty 200

## 2013-05-19 SURGICAL SUPPLY — 10 items
BAG URO CATCHER STRL LF (DRAPE) ×2 IMPLANT
CATH URET 5FR 28IN OPEN ENDED (CATHETERS) ×2 IMPLANT
CLOTH BEACON ORANGE TIMEOUT ST (SAFETY) ×2 IMPLANT
GLOVE BIOGEL M STRL SZ7.5 (GLOVE) ×2 IMPLANT
GOWN PREVENTION PLUS XLARGE (GOWN DISPOSABLE) ×2 IMPLANT
GOWN STRL REIN XL XLG (GOWN DISPOSABLE) ×2 IMPLANT
GUIDEWIRE STR DUAL SENSOR (WIRE) ×2 IMPLANT
PACK CYSTO (CUSTOM PROCEDURE TRAY) ×2 IMPLANT
SYR 20CC LL (SYRINGE) ×2 IMPLANT
TUBING CONNECTING 10 (TUBING) IMPLANT

## 2013-05-19 NOTE — Progress Notes (Signed)
Subjective: Doing well but tired.  Objective: Vital signs in last 24 hours: Temp:  [97.6 F (36.4 C)-98.3 F (36.8 C)] 98.1 F (36.7 C) (08/27 0325) Pulse Rate:  [55-71] 62 (08/27 0325) Resp:  [18-19] 18 (08/27 0325) BP: (115-119)/(66-73) 115/68 mmHg (08/27 0325) SpO2:  [100 %] 100 % (08/27 0325) Weight:  [117 lb (53.071 kg)] 117 lb (53.071 kg) (08/27 0152) Last BM Date:  (colostomy)  Intake/Output from previous day: 08/26 0701 - 08/27 0700 In: 355 [P.O.:355] Out: 900 [Urine:900] Intake/Output this shift:    Medications Current Facility-Administered Medications  Medication Dose Route Frequency Provider Last Rate Last Dose  . acetaminophen (TYLENOL) tablet 650 mg  650 mg Oral Q6H PRN Eduard Clos, MD       Or  . acetaminophen (TYLENOL) suppository 650 mg  650 mg Rectal Q6H PRN Eduard Clos, MD      . multivitamin with minerals tablet 1 tablet  1 tablet Oral Daily Eduard Clos, MD   1 tablet at 05/18/13 0959  . ondansetron (ZOFRAN) tablet 4 mg  4 mg Oral Q6H PRN Eduard Clos, MD       Or  . ondansetron [facility]) injection 4 mg  4 mg Intravenous Q6H PRN Eduard Clos, MD      . ondansetron [facility]) tablet 4 mg  4 mg Oral Q6H Eduard Clos, MD   4 mg at 05/19/13 0608  . oxyCODONE-acetaminophen (PERCOCET/ROXICET) 5-325 MG per tablet 1 tablet  1 tablet Oral Q4H PRN Rhetta Mura, MD   1 tablet at 05/19/13 0738   And  . oxyCODONE (Oxy IR/ROXICODONE) immediate release tablet 5 mg  5 mg Oral Q4H PRN Rhetta Mura, MD   5 mg at 05/19/13 0738  . potassium chloride SA (K-DUR,KLOR-CON) CR tablet 40 mEq  40 mEq Oral BID Rhetta Mura, MD   40 mEq at 05/18/13 2147  . sodium chloride 0.9 % injection 3 mL  3 mL Intravenous Q12H Eduard Clos, MD   3 mL at 05/18/13 2147    PE: General appearance: alert, cooperative and no distress Lungs: clear to auscultation bilaterally Heart: regular rate and rhythm, S1, S2 normal, no  murmur, click, rub or gallop Extremities: No LEE Pulses: 2+ and symmetric Skin: Warm and dry Neurologic: Grossly normal  Lab Results:   Recent Labs  05/18/13 0041 05/18/13 0535 05/18/13 1418  WBC 8.2 7.8 8.9  HGB 7.2* 8.1* 7.6*  HCT 21.8* 24.4* 22.8*  PLT 290 359 342   BMET  Recent Labs  05/17/13 1828 05/17/13 2035 05/18/13 0020 05/18/13 0535  NA 139  --   --  140  K 2.7* 2.6* 3.4* 3.2*  CL 104  --   --  111  CO2 21  --   --  20  GLUCOSE 90  --   --  133*  BUN 10  --   --  10  CREATININE 0.84  --   --  0.74  CALCIUM 10.8*  --   --  9.4     Assessment/Plan  Principal Problem:   Syncope Active Problems:   Hypokalemia   NICM EF of 20% by echo 01/2013-improved 35-40% 05/07/13  Plan:  The only arrhythmia on tele that I see is a short run of bigeminy which occurred last night.  Not much room with BP to add a beta blocker.  30 day Cardionet monitor would be a good idea.  Potassium has been consistently low.  BMET order  for this AM.   Last Hgb 7.6.  May need transfusion.   Cystoscopy planned at [facility] today.      LOS: 2 days    Vanessa [name] 05/19/2013 8:16 AM

## 2013-05-19 NOTE — Anesthesia Preprocedure Evaluation (Addendum)
Anesthesia Evaluation  Patient identified by MRN, date of birth, ID band Patient awake    Reviewed: Allergy & Precautions, H&P , NPO status , Patient's Chart, lab work & pertinent test results  Airway Mallampati: II TM Distance: >3 FB Neck ROM: Full    Dental no notable dental hx.    Pulmonary former smoker,  breath sounds clear to auscultation  Pulmonary exam normal       Cardiovascular hypertension, +CHF + dysrhythmias Rhythm:Regular Rate:Normal  Non-ischemic cardiomyopathy, EF of 20% by echo 01/2013-improved 35-40% 05/07/13   Neuro/Psych negative neurological ROS  negative psych ROS   GI/Hepatic negative GI ROS, Neg liver ROS, hiatal hernia,   Endo/Other  negative endocrine ROS  Renal/GU negative Renal ROS  negative genitourinary   Musculoskeletal negative musculoskeletal ROS (+)   Abdominal   Peds negative pediatric ROS (+)  Hematology  (+) Blood dyscrasia, anemia ,   Anesthesia Other Findings   Reproductive/Obstetrics negative OB ROS                           Anesthesia Physical Anesthesia Plan  ASA: III  Anesthesia Plan: General   Post-op Pain Management:    Induction: Intravenous  Airway Management Planned: LMA  Additional Equipment:   Intra-op Plan:   Post-operative Plan:   Informed Consent: I have reviewed the patients History and Physical, chart, labs and discussed the procedure including the risks, benefits and alternatives for the proposed anesthesia with the patient or authorized representative who has indicated his/her understanding and acceptance.   Dental advisory given  Plan Discussed with: CRNA  Anesthesia Plan Comments:         Anesthesia Quick Evaluation

## 2013-05-19 NOTE — Progress Notes (Signed)
Pt c/o yellowish greenish vaginal discharge. MD on call made aware. No new orders given. Will continue to monitor the pt. Sanda Linger, RN

## 2013-05-19 NOTE — Clinical Documentation Improvement (Signed)
THIS DOCUMENT IS NOT A PERMANENT PART OF THE MEDICAL RECORD  Please update your documentation with the medical record to reflect your response to this query. If you need help knowing how to do this please call [phone].  05/19/13   Dear Dr. Toniann Fail,  Current Primary Diagnosis is "syncope" which is a nonspecific low weighted symptom. If possible, please document the possible/probable/suspected underlying cause of "syncope" to clarify patient's severity of illness and risk of mortality. Thanks so much.  Possible Clinical Conditions? - radiation cystitis - acute blood loss anemia - cardiac dysrhythmia - NICM  - other (please specify)  You may use possible, probable, or suspect with inpatient documentation. possible, probable, suspected diagnoses MUST be documented at the time of discharge  Reviewed: additional documentation in the medical record  Thank You,  Beverley Fiedler RN BSN Clinical Documentation Specialist: [phone] Health Information Management [place]

## 2013-05-19 NOTE — Anesthesia Postprocedure Evaluation (Signed)
  Anesthesia Post-op Note  Patient: Vanessa [name]  Procedure(s) Performed: Procedure(s) (LRB): CYSTOSCOPY WITH BILATERAL RETROGRADE PYELOGRAM/CLOT EVACUATION (Bilateral)  Patient Location: PACU  Anesthesia Type: General  Level of Consciousness: awake and alert   Airway and Oxygen Therapy: Patient Spontanous Breathing  Post-op Pain: mild  Post-op Assessment: Post-op Vital signs reviewed, Patient's Cardiovascular Status Stable, Respiratory Function Stable, Patent Airway and No signs of Nausea or vomiting  Last Vitals:  Filed Vitals:   05/19/13 1430  BP: 140/104  Pulse: 61  Temp:   Resp: 10    Post-op Vital Signs: stable   Complications: No apparent anesthesia complications

## 2013-05-19 NOTE — Transfer of Care (Signed)
Immediate Anesthesia Transfer of Care Note  Patient: Vanessa [name]  Procedure(s) Performed: Procedure(s): CYSTOSCOPY WITH BILATERAL RETROGRADE PYELOGRAM/CLOT EVACUATION (Bilateral)  Patient Location: PACU  Anesthesia Type:General  Level of Consciousness: awake, alert  and oriented  Airway & Oxygen Therapy: Patient Spontanous Breathing and Patient connected to face mask oxygen  Post-op Assessment: Report given to PACU RN  Post vital signs: Reviewed and stable  Complications: No apparent anesthesia complications

## 2013-05-19 NOTE — H&P (Signed)
Consult: Gross hematuria  Requested by: Dr. Mahala Menghini  History of Present Illness: Patient reports she developed gross hematuria with clots yesterday in the ER when she presented for syncope. She continues to have some small clots but is passing her urine without difficulty. A post void was 176 cc. A CT the abdomen and pelvis showed a thickened bladder wall and high density material in the bladder likely clots although tumor needs to be ruled out. There was no hydronephrosis, the ureters appeared normal. There are markers or brachy seeds posterior to the bladder at the vaginal cuff. Ureters diffucult to follow. I reviewed all the images. Her renal function is normal. UA showed TNTC rbc and rare bacteria. She has no dysuria or fever.  She has had radiation for endometrial cancer and subsequent extensive pelvic surgery, abscesses and fistula. She had hematuria without clots in May of 2014 and was seen as an inpatient consult by me. At that time CT of the abdomen and pelvis was normal. She did not followup as instructed for cystoscopy.  Prior hx -  Appreciate list provided by Dr. Mahala Menghini:  Admission 04/16/13 for repair of ostomy, noted furhter abcesses as above-to see Dr. [name] 9/14  Admission 02/03/13 with prolapsed ischemic colostomy status post revision, DVT at that time noted-had an enterocolonic fisutla as well addressed at subsequent hospitalization as above  Admission 5 8 14  with left lower quadrant pain  Admission 08/11/12 for ileostomy reversal of frozen pelvis did not allow this and had ileostomy converted 2 colostomy  Admission 03/21/2012 with small bowel obstruction  Admission 03/16/2012 small bowel obstruction vs. Ileus  Admission 5/6/013 for colon stricture colonoscopy with high-grade stricture-status post colon resection and sigmoid diverting loop ileostomy 02/04/2012-had complications including , Infection, abdominal abscesses, wound care issues-hospital service, ID, hematology was involved in  her care  Admission 10/02/2010 for repeat Brachytherapy in the management of cervical cancer  Admission 08/18/2008 postpartum preeclampsia Past Medical History   Diagnosis  Date   .  History of blood transfusion      "I had ~ 7 in 01/2012"   .  Anemia    .  Radiation  Nov.18,2011-Jan.23,2012     cervix   .  Hypertension      no treatment necessary for BP since July 2013   .  Non-ischemic cardiomyopathy, EF 25 %  02/14/2013   .  At risk for sudden cardiac death  03-11-13     hs life vest   .  CHF (congestive heart failure)    .  Dysrhythmia      WEARS LIFE VEST (DR. C [facility])   .  Cervical cancer  ~ 2011     cervical    Past Surgical History   Procedure  Laterality  Date   .  Radiation implants   ~ 2011     "for cervical cancer"   .  Colonoscopy   01/30/2012     Procedure: COLONOSCOPY; Surgeon: Beverley Fiedler, MD; Location: [facility] ENDOSCOPY; Service: Gastroenterology; Laterality: N/A;   .  Colonoscopy   01/30/2012     Procedure: COLONOSCOPY; Surgeon: Beverley Fiedler, MD; Location: [facility] OR; Service: Gastroenterology; Laterality: N/A;   .  Colostomy revision   02/04/2012     Procedure: COLON RESECTION SIGMOID; Surgeon: Cherylynn Ridges, MD; Location: [facility] OR; Service: General; Laterality: N/A;   .  Cholecystectomy   2006   .  Laparotomy   08/11/2012     Procedure: EXPLORATORY LAPAROTOMY; Surgeon: Cherylynn Ridges,  MD; Location: [facility] OR; Service: General; Laterality: N/A;   .  Laparoscopic lysis of adhesions   08/11/2012     Procedure: LAPAROSCOPIC LYSIS OF ADHESIONS; Surgeon: Cherylynn Ridges, MD; Location: [facility] OR; Service: General; Laterality: N/A;   .  Bowel resection   08/11/2012     Procedure: SMALL BOWEL RESECTION; Surgeon: Cherylynn Ridges, MD; Location: [facility] OR; Service: General; Laterality: N/A;   .  Colostomy   08/11/2012     Procedure: COLOSTOMY; Surgeon: Cherylynn Ridges, MD; Location: [facility] OR; Service: General; Laterality: N/A;   .  Colostomy revision  N/A  02/03/2013     Procedure: COLOSTOMY REVISION;  Surgeon: Cherylynn Ridges, MD; Location: [facility] OR; Service: General; Laterality: N/A;   .  Colostomy revision  N/A  04/16/2013     Procedure: COLOSTOMY REVISION, REPAIR COLOSTOMY FISTULA; Surgeon: Cherylynn Ridges, MD; Location: [facility] OR; Service: General; Laterality: N/A;   .  Laparotomy  N/A  04/16/2013     Procedure: EXPLORATORY LAPAROTOMY,Partial Colectomy,Small bowel resection; Surgeon: Cherylynn Ridges, MD; Location: [facility] OR; Service: General; Laterality: N/A;   .  Appendectomy  N/A  04/16/2013     Procedure: APPENDECTOMY; Surgeon: Cherylynn Ridges, MD; Location: [facility] OR; Service: General; Laterality: N/A;    Home Medications:  Prescriptions prior to admission   Medication  Sig  Dispense  Refill   .  Multiple Vitamin (MULTIVITAMIN WITH MINERALS) TABS  Take 1 tablet by mouth daily.     .  ondansetron (ZOFRAN) 4 MG tablet  Take 1 tablet (4 mg total) by mouth every 6 (six) hours.  20 tablet  0   .  oxyCODONE-acetaminophen (PERCOCET) 10-325 MG per tablet  Take 1 tablet by mouth every 4 (four) hours as needed for pain.  60 tablet  0    Allergies:  Allergies   Allergen  Reactions   .  Penicillins  Other (See Comments)     "anything w/penicillin in it makes me bleed"   .  Labetalol Hcl  Itching and Other (See Comments)     Bumps to bilateral arms.   .  Morphine And Related  Hives    Family History   Problem  Relation  Age of Onset   .  Hypertension  Mother    .  Healthy  Father    .  Healthy  Brother    .  Healthy  Brother     Social History: reports that she quit smoking about 15 months ago. Her smoking use included Cigarettes. She has a .25 pack-year smoking history. She has never used smokeless tobacco. She reports that drinks alcohol. She reports that she uses illicit drugs (Marijuana).  ROS:  A complete review of systems was performed. All systems are negative except for pertinent findings as noted.  @ROS @  Physical Exam: Nurse was in room as chaperone.  Vital signs in last 24 hours:  Temp: [97.6 F  (36.4 C)-97.8 F (36.6 C)] 97.6 F (36.4 C) (08/26 1451)  Pulse Rate: [55-130] 55 (08/26 1451)  Resp: [9-24] 19 (08/26 1451)  BP: (105-142)/(58-93) 119/66 mmHg (08/26 1451)  SpO2: [96 %-100 %] 100 % (08/26 1451)  Weight: [52.118 kg (114 lb 14.4 oz)] 52.118 kg (114 lb 14.4 oz) (08/26 0103)  General: Alert and oriented, No acute distress  HEENT: Normocephalic, atraumatic  Neck: No JVD or lymphadenopathy  Cardiovascular: Regular rate and rhythm  Lungs: Regular rate and effort  Abdomen: Soft, nontender, nondistended, no abdominal masses, ML dressing, LLQ  stoma, RLQ pelvic drain  Back: No CVA tenderness  Extremities: No edema  Neurologic: Grossly intact  Laboratory Data:  Results for orders placed during the hospital encounter of 05/17/13 (from the past 24 hour(s))   URINALYSIS, ROUTINE W REFLEX MICROSCOPIC Status: Abnormal    Collection Time    05/17/13 8:30 PM   Result  Value  Range    Color, Urine  YELLOW  YELLOW    APPearance  TURBID (*)  CLEAR    Specific Gravity, Urine  >1.030 (*)  1.005 - 1.030    pH  7.0  5.0 - 8.0    Glucose, UA  NEGATIVE  NEGATIVE mg/dL    Hgb urine dipstick  LARGE (*)  NEGATIVE    Bilirubin Urine  NEGATIVE  NEGATIVE    Ketones, ur  NEGATIVE  NEGATIVE mg/dL    Protein, ur  >253 (*)  NEGATIVE mg/dL    Urobilinogen, UA  1.0  0.0 - 1.0 mg/dL    Nitrite  POSITIVE (*)  NEGATIVE    Leukocytes, UA  TRACE (*)  NEGATIVE   URINE RAPID DRUG SCREEN (HOSP PERFORMED) Status: Abnormal    Collection Time    05/17/13 8:30 PM   Result  Value  Range    Opiates  NONE DETECTED  NONE DETECTED    Cocaine  NONE DETECTED  NONE DETECTED    Benzodiazepines  NONE DETECTED  NONE DETECTED    Amphetamines  NONE DETECTED  NONE DETECTED    Tetrahydrocannabinol  POSITIVE (*)  NONE DETECTED    Barbiturates  NONE DETECTED  NONE DETECTED   URINE MICROSCOPIC-ADD ON Status: None    Collection Time    05/17/13 8:30 PM   Result  Value  Range    WBC, UA  0-2  <3 WBC/hpf    RBC / HPF   TOO NUMEROUS TO COUNT  <3 RBC/hpf    Bacteria, UA  RARE  RARE    Urine-Other  URINALYSIS PERFORMED ON SUPERNATANT    POTASSIUM Status: Abnormal    Collection Time    05/17/13 8:35 PM   Result  Value  Range    Potassium  2.6 (*)  3.5 - 5.1 mEq/L   MAGNESIUM Status: None    Collection Time    05/17/13 9:20 PM   Result  Value  Range    Magnesium  1.8  1.5 - 2.5 mg/dL   POCT I-STAT TROPONIN I Status: None    Collection Time    05/17/13 11:18 PM   Result  Value  Range    Troponin i, poc  0.00  0.00 - 0.08 ng/mL    Comment 3     POTASSIUM Status: Abnormal    Collection Time    05/18/13 12:20 AM   Result  Value  Range    Potassium  3.4 (*)  3.5 - 5.1 mEq/L   MAGNESIUM Status: None    Collection Time    05/18/13 5:35 AM   Result  Value  Range    Magnesium  1.6  1.5 - 2.5 mg/dL   BASIC METABOLIC PANEL Status: Abnormal    Collection Time    05/18/13 5:35 AM   Result  Value  Range    Sodium  140  135 - 145 mEq/L    Potassium  3.2 (*)  3.5 - 5.1 mEq/L    Chloride  111  96 - 112 mEq/L    CO2  20  19 - 32 mEq/L  Glucose, Bld  133 (*)  70 - 99 mg/dL    BUN  10  6 - 23 mg/dL    Creatinine, Ser  4.54  0.50 - 1.10 mg/dL    Calcium  9.4  8.4 - 10.5 mg/dL    GFR calc non Af Amer  >90  >90 mL/min    GFR calc Af Amer  >90  >90 mL/min   CBC Status: Abnormal    Collection Time    05/18/13 5:35 AM   Result  Value  Range    WBC  7.8  4.0 - 10.5 K/uL    RBC  3.02 (*)  3.87 - 5.11 MIL/uL    Hemoglobin  8.1 (*)  12.0 - 15.0 g/dL    HCT  09.8 (*)  11.9 - 46.0 %    MCV  80.8  78.0 - 100.0 fL    MCH  26.8  26.0 - 34.0 pg    MCHC  33.2  30.0 - 36.0 g/dL    RDW  14.7 (*)  82.9 - 15.5 %    Platelets  359  150 - 400 K/uL   TYPE AND SCREEN Status: None    Collection Time    05/18/13 2:18 PM   Result  Value  Range    ABO/RH(D)  A POS     Antibody Screen  NEG     Sample Expiration  05/21/2013    CBC Status: Abnormal    Collection Time    05/18/13 2:18 PM   Result  Value  Range    WBC   8.9  4.0 - 10.5 K/uL    RBC  2.83 (*)  3.87 - 5.11 MIL/uL    Hemoglobin  7.6 (*)  12.0 - 15.0 g/dL    HCT  56.2 (*)  13.0 - 46.0 %    MCV  80.6  78.0 - 100.0 fL    MCH  26.9  26.0 - 34.0 pg    MCHC  33.3  30.0 - 36.0 g/dL    RDW  86.5 (*)  78.4 - 15.5 %    Platelets  342  150 - 400 K/uL    No results found for this or any previous visit (from the past 240 hour(s)).  Creatinine:   Recent Labs   05/17/13 1828  05/18/13 0535   CREATININE  0.84  0.74    Impression/Assessment:  Gross hematuria  Plan:  I discussed with the patient the nature, potential benefits, risks and alternatives to cystoscopy, clot evacuation, bilateral retrograde pyelograms, possible bladder biopsy/fulguration, including side effects of the proposed treatment, the likelihood of the patient achieving the goals of the procedure, and any potential problems that might occur during the procedure or recuperation. All questions answered. Patient elects to proceed. I discussed with the patient my associate Dr. Sherron Monday will perform the procedure tomorrow at [facility]. I discussed the patient with Dr. Sherron Monday.   After a thorough review of the management options for the patient's condition the patient  elected to proceed with surgical therapy as noted above. We have discussed the potential benefits and risks of the procedure, side effects of the proposed treatment, the likelihood of the patient achieving the goals of the procedure, and any potential problems that might occur during the procedure or recuperation. Informed consent has been obtained.

## 2013-05-19 NOTE — Interval H&P Note (Signed)
History and Physical Interval Note:  05/19/2013 1:04 PM  Vanessa [name]  has presented today for surgery, with the diagnosis of gross hematuria  The various methods of treatment have been discussed with the patient and family. After consideration of risks, benefits and other options for treatment, the patient has consented to  Procedure(s): CYSTOSCOPY WITH BILATERAL RETROGRADE PYELOGRAM/CLOT EVACUATION/POSSIBLE BLADDER BIOPSY (Bilateral) as a surgical intervention .  The patient's history has been reviewed, patient examined, no change in status, stable for surgery.  I have reviewed the patient's chart and labs.  Questions were answered to the patient's satisfaction.     Lucas Exline A

## 2013-05-19 NOTE — Progress Notes (Signed)
Pt. Seen and examined. Agree with the NP/PA-C note as written.  No arrythmias overnight. Low normal BP - may not have room for additional b-blocker. I agree with EP evaluation - she may be a good candidate for a Linq monitor - if she has demonstrated ventricular arryhtmias, an ICD may be indicated. It would be good to get an EP opinion on this. She is about to be transferred to [facility] for cystoscopy, but she should be evaluated before being discharged.  Chrystie Nose, MD, [facility] Attending Cardiologist [facility]

## 2013-05-19 NOTE — Op Note (Signed)
Preoperative diagnosis: Bladder clot and hematuria Postoperative diagnosis: Bladder clot and hematuria and bilateral hydroureter Surgery: Cystoscopy, evacuation of bladder clots and bilateral retrograde ureterogram Surgeon: Dr. Alfredo Martinez  I was asked for Dr. Mena Goes to perform the above procedure for the above diagnoses. I reviewed her CT scan in her upper tracts looked reasonable especially for the clinical situation. She had either tumor or and/or clot in her bladder. She she was voiding spontaneously without pain and without a lot of hematuria and no longer had a catheter. She was given preoperative ciprofloxacin. Leg position was excellent. Pelvic examination demonstrated a somewhat fixed pelvis from radiation  I initially cystoscoped the patient with a 9 [place] scope and 30 lens. I could see old clot in the bladder was not a lot of visibility in her bladder was somewhat spastic. At this point I took the [facility] irrigator and irrigated a modest palm size amount of clots of her bladder. I cystoscoped the patient after this and her bladder was actually excellent. She had 2 ureteral orifices identified. There was clear urine coming from each ureter. There were small almost petechiae throughout the bladder that were mild in severity and in keeping with radiation. There is no tumor the biopsy. She did not have carcinoma in situ.  I performed a bilateral retrograde ureterogram. Approximately 10 cc of contrast was used on the right and 7 cc in the left. Under fluoroscopic and cystoscopic guidance I passed a sensor wire to the lower ureter bilaterally. Over this I passed a 6 [place] open-ended ureteral catheter removing a sensor wire. I did bilateral retrograde from the lower aspect of the ureter. On the right side she had a dilated ureter to approximately the pelvic brim where looked narrow. There were some filling defects that looked somewhat like clot but she did not have this on CT and again her urine  after the retrograde was clear and I therefore feel there likely air bubbles. She had fullness of the right renal pelvis.  With the same technique I performed retrograde on the left side. She had some dilation of the upper left ureter with some narrowing noted in the x-rays were. The ureters on both sides had a little bit of shagginess almost in keeping with ureteritis. Under the circumstances I did not perform extensive radiographs of her upper tracts  I reinspected the bladder. I emptied the bladder. The patient taken to recovery

## 2013-05-19 NOTE — Progress Notes (Signed)
[facility] PROGRESS NOTE  [name] ZOX:[id number] DOB: Aug 06, 1976 DOA: 05/17/2013 PCP: Dorrene German, MD  Assessment/Plan: Principal Problem:   Syncope Active Problems:   Hypokalemia   NICM EF of 20% by echo 01/2013-improved 35-40% 05/07/13    1. Syncope: Patient had a syncopal episode on day of presentation, raising the specter of vasovagal episode secondary to ABLA/orthostasis, vs arrhythmia: She has had no recurrence. Telemetric monitoring failed to reveal any culprit arrhythmias. See further discussion in # 5 below. .  2. Gross hematuria with clots/Anemia: Patient developed gross hematuria with blood clots on 05/17/13. HB on admission was 10.2. Following serial CBCs, and HB had dropped progressively. Dr Mena Goes p[rovided initial urology consultation, and patient underwent cystoscopy, evacuation of bladder clots and bilateral retrograde ureterogram by Dr. Lorin Picket MacDiarmid on 05/19/13. As HB is 7.6 today, we shall transfuse  1unit PRBC.  3. H/o stage IB-2 squamous cell CA cancer, status post brachy therapy: Currently stable. For outpatient surveillance with oncology/Radiation oncology. 4. H/o DVT: Patient is s/p anticoagulation for about 8 weeks and then this was discontinued. 5. Nonischemic cardiomyopathy: Patient had known low EF of 20% originally, which improved to 30% on most recent MRI. Apparently, cardiac MRI last visit showed no structural anomaly-appreciate cardiology,. Dr Nanetta Batty provided cardiology consultation, and patient was subsequently seen by Dr Rennis Golden. EP evaluation is recommended, as it is felt that she may be a good candidate for a Linq monitor, or if ventricular arrhythmias are documented, an ICD may be indicated.  6. Hypertension: Patient has a known history of HTN, but SBP has been borderline-low, since hospitalization. Following BP, and holding pre-admission antihypertensives. 7. Hypokalemia: Repleting as indicated.  8. Ostomy: Consulted WOC.  Managing as recommended.  Patient has multiple surgical issues, s/p multiple complex surgeries, and has Ostomy and percutaneous abscess drain. Appears clinically stable, and has follow up scheduled with Dr [name] on 06/01/13.    Code Status: Full Code.  Family Communication:  Disposition Plan: To be determined.    Brief narrative: Unfrotunate 37 y/o female, known Cervical Ca, s/p Radiation therapy 2013, Radiation  colitis, HTN, Eclampsia, Colostomy/revision and Cholecystectomy, recent admission for reversal, complicated by large intra-abd fluid/Gas/Abd abcess with possible SB dehiscence. Hospital course complicated by Hypotension, But repeat 2D Echocardiogram 05/06/13, significantly better (went from 20%-40-45) found to be improved and discharged home.  Patient presented to the hospital 05/17/2013 following a syncopal episode and was noted to have dark urine as well as gross hematuria with clots. Admitted for further investigation and management.    Consultants:  Dr Nanetta Batty, cardiologist.  Dr Jerilee Field, urologist.  Dr Lorin Picket McDarmid, urologist.   Procedures:  See notes.   Antibiotics:  N/A.   HPI/Subjective: No new issues.   Objective: Vital signs in last 24 hours: Temp:  [98 F (36.7 C)-98.3 F (36.8 C)] 98 F (36.7 C) (08/27 1500) Pulse Rate:  [61-71] 61 (08/27 1430) Resp:  [10-18] 10 (08/27 1430) BP: (115-154)/(68-104) 135/95 mmHg (08/27 1445) SpO2:  [100 %] 100 % (08/27 1430) Weight:  [53.071 kg (117 lb)] 53.071 kg (117 lb) (08/27 0152) Weight change: 0.953 kg (2 lb 1.6 oz) Last BM Date:  (colostomy)  Intake/Output from previous day: 08/26 0701 - 08/27 0700 In: 355 [P.O.:355] Out: 900 [Urine:900] Total I/O In: 990 [P.O.:240; I.V.:750] Out: 125 [Urine:125]   Physical Exam: General: Comfortable, alert, communicative, fully oriented, not short of breath at rest.  HEENT:  Moderate clinical pallor, no jaundice, no conjunctival injection or  discharge.  NECK:  Supple, JVP not seen, no carotid bruits, no palpable lymphadenopathy, no palpable goiter. CHEST:  Clinically clear to auscultation, no wheezes, no crackles. HEART:  Sounds 1 and 2 heard, normal, regular, no murmurs. ABDOMEN:  Full, soft, non-tender, no palpable organomegaly, no palpable masses, normal bowel sounds. Has functioning colostomy bag. Percutaneous drain is in RLQ.  GENITALIA:  Not examined. LOWER EXTREMITIES:  No pitting edema, palpable peripheral pulses. MUSCULOSKELETAL SYSTEM:  Unremarkable. CENTRAL NERVOUS SYSTEM:  No focal neurologic deficit on gross examination.  Lab Results:  Recent Labs  05/18/13 0535 05/18/13 1418  WBC 7.8 8.9  HGB 8.1* 7.6*  HCT 24.4* 22.8*  PLT 359 342    Recent Labs  05/17/13 1828  05/18/13 0020 05/18/13 0535  NA 139  --   --  140  K 2.7*  < > 3.4* 3.2*  CL 104  --   --  111  CO2 21  --   --  20  GLUCOSE 90  --   --  133*  BUN 10  --   --  10  CREATININE 0.84  --   --  0.74  CALCIUM 10.8*  --   --  9.4  < > = values in this interval not displayed. Recent Results (from the past 240 hour(s))  URINE CULTURE     Status: None   Collection Time    05/17/13  8:30 PM      Result Value Range Status   Specimen Description URINE, CLEAN CATCH   Final   Special Requests NONE   Final   Culture  Setup Time     Final   Value: 05/18/2013 07:26     Performed at [facility] Count     Final   Value: NO GROWTH     Performed at [facility]   Culture     Final   Value: NO GROWTH     Performed at [facility]   Report Status 05/19/2013 FINAL   Final     Studies/Results: Ct Angio Chest Pe W/cm &/or Wo Cm  05/17/2013   *RADIOLOGY REPORT*  Clinical Data: Elevated D-dimer, abdominal pain  CT ANGIOGRAPHY CHEST,CT ABDOMEN AND PELVIS WITH CONTRAST  Technique:  Multidetector CT imaging of the chest using the standard protocol during bolus administration of intravenous contrast. Multiplanar  reconstructed images including MIPs were obtained and reviewed to evaluate the vascular anatomy.,Technique: Mul  Contrast: 80mL OMNIPAQUE IOHEXOL 350 MG/ML SOLN  Comparison: 05/04/2013 abdominal CT, 08/06/2010 PET CT  CHEST: Findings: Pulmonary arteries are patent.  Normal caliber aorta and branch vessels.  Cardiomegaly.  No pleural or pericardial effusion. Mildly prominent left axillary lymph node measuring 1 cm short axis on image 93 series 6. This has developed since the prior PET CT. Otherwise, no intrathoracic lymphadenopathy.  Central airways are patent.  No pneumothorax.  Linear opacity within the right lower lobe, favored to reflect atelectasis or scar.  Unchanged 3 mm nodule right lung base, may be vascular. No acute osseous finding.  IMPRESSION: No pulmonary embolism or acute intrathoracic process.  Nonspecific enlarged left axillary lymph node.  Consider tissue sampling.  Mild cardiomegaly/left ventricular enlargement.  Correlate with echocardiogram.  ABDOMEN/PELVIS Findings: Unremarkable liver, spleen, pancreas.  Cholecystectomy. No biliary ductal dilatation.  Unremarkable adrenal glands.  Symmetric renal enhancement.  Nonobstructing lower pole stone on the left.  Pancolitis and left lower quadrant colostomy.  No bowel obstruction.  Surgical drain terminates within the midline anterior abdomen.  Complex peripherally enhancing collection within the pelvis tracks superiorly, inseparable from adjacent loops of bowel. The amount of extraluminal gas has decreased.  Surgical anastomotic suture right lower quadrant. Open midline laparotomy.  Normal caliber aorta and branch vessels.  Circumferential bladder wall thickening.  Intraluminal high attenuation presumably reflects clot.  Uterus is presumably absent with irregularly enhancing soft tissue within the pelvis, similar to prior.  No interval osseous change.  IMPRESSION:  Postoperative changes of left lower quadrant colostomy. Nonspecific pancolitis.  No  small bowel obstruction.  Pelvic abscess persists though has decreased in size, with the largest component measuring 2.1 x 1.1 cm within the left anterior pelvis.  Circumferential bladder wall thickening.  Interval intraluminal high attenuation presumably reflects blood clot.  Correlate with urinalysis.  May reflect cystitis including radiation cystitis.  Uterus is presumably absent with irregularly enhancing soft tissue within the pelvis, similar to prior.   Original Report Authenticated By: Jearld Lesch, M.D.   Ct Abdomen Pelvis W Contrast  05/17/2013   *RADIOLOGY REPORT*  Clinical Data: Elevated D-dimer, abdominal pain  CT ANGIOGRAPHY CHEST,CT ABDOMEN AND PELVIS WITH CONTRAST  Technique:  Multidetector CT imaging of the chest using the standard protocol during bolus administration of intravenous contrast. Multiplanar reconstructed images including MIPs were obtained and reviewed to evaluate the vascular anatomy.,Technique: Mul  Contrast: 80mL OMNIPAQUE IOHEXOL 350 MG/ML SOLN  Comparison: 05/04/2013 abdominal CT, 08/06/2010 PET CT  CHEST: Findings: Pulmonary arteries are patent.  Normal caliber aorta and branch vessels.  Cardiomegaly.  No pleural or pericardial effusion. Mildly prominent left axillary lymph node measuring 1 cm short axis on image 93 series 6. This has developed since the prior PET CT. Otherwise, no intrathoracic lymphadenopathy.  Central airways are patent.  No pneumothorax.  Linear opacity within the right lower lobe, favored to reflect atelectasis or scar.  Unchanged 3 mm nodule right lung base, may be vascular. No acute osseous finding.  IMPRESSION: No pulmonary embolism or acute intrathoracic process.  Nonspecific enlarged left axillary lymph node.  Consider tissue sampling.  Mild cardiomegaly/left ventricular enlargement.  Correlate with echocardiogram.  ABDOMEN/PELVIS Findings: Unremarkable liver, spleen, pancreas.  Cholecystectomy. No biliary ductal dilatation.  Unremarkable adrenal  glands.  Symmetric renal enhancement.  Nonobstructing lower pole stone on the left.  Pancolitis and left lower quadrant colostomy.  No bowel obstruction.  Surgical drain terminates within the midline anterior abdomen.  Complex peripherally enhancing collection within the pelvis tracks superiorly, inseparable from adjacent loops of bowel. The amount of extraluminal gas has decreased.  Surgical anastomotic suture right lower quadrant. Open midline laparotomy.  Normal caliber aorta and branch vessels.  Circumferential bladder wall thickening.  Intraluminal high attenuation presumably reflects clot.  Uterus is presumably absent with irregularly enhancing soft tissue within the pelvis, similar to prior.  No interval osseous change.  IMPRESSION:  Postoperative changes of left lower quadrant colostomy. Nonspecific pancolitis.  No small bowel obstruction.  Pelvic abscess persists though has decreased in size, with the largest component measuring 2.1 x 1.1 cm within the left anterior pelvis.  Circumferential bladder wall thickening.  Interval intraluminal high attenuation presumably reflects blood clot.  Correlate with urinalysis.  May reflect cystitis including radiation cystitis.  Uterus is presumably absent with irregularly enhancing soft tissue within the pelvis, similar to prior.   Original Report Authenticated By: Jearld Lesch, M.D.    Medications: Scheduled Meds: . multivitamin with minerals  1 tablet Oral Daily  . ondansetron  4 mg Oral Q6H  . potassium chloride  40 mEq Oral BID  . sodium chloride  3 mL Intravenous Q12H   Continuous Infusions:  PRN Meds:.acetaminophen, acetaminophen, ondansetron (ZOFRAN) IV, ondansetron, oxyCODONE, oxyCODONE-acetaminophen    LOS: 2 days   Vanessa [name],Vanessa [name]  [facility] Pager [phone]. If 8PM-8AM, please contact night-coverage at [email or web address], password [facility] 05/19/2013, 5:35 PM  LOS: 2 days

## 2013-05-19 NOTE — Clinical Documentation Improvement (Signed)
THIS DOCUMENT IS NOT A PERMANENT PART OF THE MEDICAL RECORD  Please update your documentation with the medical record to reflect your response to this query. If you need help knowing how to do this please call [phone].  05/19/13   Dear Dr. Brien Few,  Per 05/18/13 evaluation by Registered Dietician: "Patient meets criteria for Severe Malnutrition in the context of chronic illness as evidenced by < 75% intake of estimated energy requirement for > 1 month and 13% weight loss x 1 month."  If you agree with this assessment please add to documentation in Notes and DC summary to clarify this patient's severity of illness and risk of mortality. Thank you.   You may use possible, probable, or suspect with inpatient documentation. possible, probable, suspected diagnoses MUST be documented at the time of discharge  Reviewed: additional documentation in the medical record  Thank You,  Beverley Fiedler RN BSN Clinical Documentation Specialist: [phone] Health Information Management [place]

## 2013-05-19 NOTE — ED Provider Notes (Signed)
Medical screening examination/treatment/procedure(s) were performed by non-physician practitioner and as supervising physician I was immediately available for consultation/collaboration.   Sarayu Prevost, MD 05/19/13 0714 

## 2013-05-20 ENCOUNTER — Encounter (HOSPITAL_COMMUNITY): Payer: Self-pay | Admitting: Urology

## 2013-05-20 DIAGNOSIS — N3001 Acute cystitis with hematuria: Secondary | ICD-10-CM | POA: Diagnosis present

## 2013-05-20 DIAGNOSIS — D649 Anemia, unspecified: Secondary | ICD-10-CM | POA: Diagnosis present

## 2013-05-20 DIAGNOSIS — I5042 Chronic combined systolic (congestive) and diastolic (congestive) heart failure: Secondary | ICD-10-CM

## 2013-05-20 DIAGNOSIS — N3000 Acute cystitis without hematuria: Secondary | ICD-10-CM

## 2013-05-20 DIAGNOSIS — I428 Other cardiomyopathies: Secondary | ICD-10-CM

## 2013-05-20 LAB — CBC
HCT: 21.8 % — ABNORMAL LOW (ref 36.0–46.0)
Hemoglobin: 8 g/dL — ABNORMAL LOW (ref 12.0–15.0)
Hemoglobin: 8 g/dL — ABNORMAL LOW (ref 12.0–15.0)
MCH: 27.1 pg (ref 26.0–34.0)
MCHC: 33.5 g/dL (ref 30.0–36.0)
MCHC: 32.8 g/dL (ref 30.0–36.0)
MCHC: 33 g/dL (ref 30.0–36.0)
MCV: 83 fL (ref 78.0–100.0)
Platelets: 266 10*3/uL (ref 150–400)
RBC: 2.91 MIL/uL — ABNORMAL LOW (ref 3.87–5.11)
RBC: 2.93 MIL/uL — ABNORMAL LOW (ref 3.87–5.11)
RBC: 3.23 MIL/uL — ABNORMAL LOW (ref 3.87–5.11)
RDW: 19.1 % — ABNORMAL HIGH (ref 11.5–15.5)
WBC: 7.8 10*3/uL (ref 4.0–10.5)

## 2013-05-20 LAB — BASIC METABOLIC PANEL
Chloride: 109 mEq/L (ref 96–112)
GFR calc Af Amer: >90 mL/min (ref >90)
Potassium: 4.1 mEq/L (ref 3.5–5.1)
Sodium: 138 mEq/L (ref 135–145)

## 2013-05-20 LAB — MAGNESIUM: Magnesium: 1.4 mg/dL — ABNORMAL LOW (ref 1.5–2.5)

## 2013-05-20 NOTE — Progress Notes (Signed)
1 Day Post-Op  Subjective: We were asked to see Anja regarding her controlled EC fistula and ostomy given her complex surgical history.  She is currently doing very well after the most recent surgery.  She denies any abdominal pain, N/V.  Her ostomy is putting out stool, and her EC fistula is draining very little.  She denies any problems with her midline wound.  She has been eating a regular diet and did not notice an increase in her fistula output.    Objective: Vital signs in last 24 hours: Temp:  [97.9 F (36.6 C)-98.8 F (37.1 C)] 98.3 F (36.8 C) (08/28 1445) Pulse Rate:  [60-72] 64 (08/28 1445) Resp:  [16-18] 18 (08/28 1445) BP: (101-126)/(58-83) 126/79 mmHg (08/28 1445) SpO2:  [100 %] 100 % (08/28 0436) Weight:  [116 lb 13.1 oz (52.989 kg)] 116 lb 13.1 oz (52.989 kg) (08/28 0436) Last BM Date: 05/19/13 (colostomy)  Intake/Output from previous day: 08/27 0701 - 08/28 0700 In: 1362.5 [P.O.:240; I.V.:800; Blood:322.5] Out: 475 [Urine:475] Intake/Output this shift: Total I/O In: 1000 [P.O.:1000] Out: 150 [Urine:150]  PE: Gen:  Alert, NAD, pleasant Abd: Soft, NT/ND, +BS, midline wound 2mm wide is clean with good granulation, but some hypergranulation noted in the superior wound; controlled fistula gravity bag with minimal tan/milky drainage, ostomy patent, pink with yellowish-brown output and flatus   Lab Results:   Recent Labs  05/20/13 0400 05/20/13 1130  WBC 7.2 7.8  HGB 8.0* 8.0*  HCT 23.9* 24.4*  PLT 291 284   BMET  Recent Labs  05/19/13 2010 05/20/13 0400  NA 137 138  K 3.6 4.1  CL 107 109  CO2 22 21  GLUCOSE 88 86  BUN 6 4*  CREATININE 0.63 0.55  CALCIUM 10.0 9.9   PT/INR No results found for this basename: LABPROT, INR,  in the last 72 hours CMP     Component Value Date/Time   NA 138 05/20/2013 0400   K 4.1 05/20/2013 0400   CL 109 05/20/2013 0400   CO2 21 05/20/2013 0400   GLUCOSE 86 05/20/2013 0400   BUN 4* 05/20/2013 0400   CREATININE  0.55 05/20/2013 0400   CALCIUM 9.9 05/20/2013 0400   CALCIUM 11.9* 05/06/2013 0500   PROT 8.1 05/06/2013 0500   ALBUMIN 2.8* 05/06/2013 0500   AST 43* 05/06/2013 0500   ALT 60* 05/06/2013 0500   ALKPHOS 138* 05/06/2013 0500   BILITOT 0.3 05/06/2013 0500   GFRNONAA >90 05/20/2013 0400   GFRAA >90 05/20/2013 0400   Lipase     Component Value Date/Time   LIPASE 8* 02/01/2013 0210       Studies/Results: No results found.  Anti-infectives: Anti-infectives   Start     Dose/Rate Route Frequency Ordered Stop   05/19/13 0831  ciprofloxacin (CIPRO) IVPB 400 mg  Status:  Discontinued     400 mg 200 mL/hr over 60 Minutes Intravenous 60 min pre-op 05/19/13 1610 05/19/13 1528       Assessment/Plan Complex surgical history, S/P left/sigmoid colectomy with anastomotic leak- ileostomy 04/16/13 Dr. [name] Entero-colonic fistula  Colostomy prolapse, with revision  NICM EF of 20% by echo 01/2013-improved 35-40% 05/07/13  Plan: 1.  Continue diet as tolerated 2.  Wound care to the midline wound (dry dressing) 3.  Drain and ostomy management 4.  Okay from our standpoint for discharge tomorrow per primary service 5.  Has f/u appt on 06/01/13 with Dr. [name] 6.  Will sign off, call with questions/concerns    LOS: 3  days    Aris [place] 05/20/2013, 2:59 PM Pager: [phone]

## 2013-05-20 NOTE — Progress Notes (Signed)
Patient ID: Vanessa [name], female   DOB: [birth year], 37 y.o.   MRN: [id number]   She reports a unit of PRBC transfused. Some mild hematuria. No trouble voiding or large clots. H/h low but stable from 8/26 - 8/27 and up today after transfusion. I reviewed Dr. Sherron Monday op note and discussed results with patient.  Filed Vitals:   05/20/13 0436  BP: 102/64  Pulse: 68  Temp: 98.3 F (36.8 C)  Resp: 18   CBC    Component Value Date/Time   WBC 7.2 05/20/2013 0400   WBC 5.3 10/22/2010 1258   RBC 2.91* 05/20/2013 0400   RBC 3.48* 10/22/2010 1258   HGB 8.0* 05/20/2013 0400   HGB 11.2* 10/22/2010 1258   HCT 23.9* 05/20/2013 0400   HCT 33.1* 10/22/2010 1258   PLT 291 05/20/2013 0400   PLT 287 10/22/2010 1258   MCV 82.1 05/20/2013 0400   MCV 94.9 10/22/2010 1258   MCH 27.5 05/20/2013 0400   MCH 29.1 09/19/2010 0847   MCHC 33.5 05/20/2013 0400   MCHC 33.8 10/22/2010 1258   RDW 18.5* 05/20/2013 0400   RDW 25.0* 10/22/2010 1258   LYMPHSABS 2.5 05/17/2013 1828   LYMPHSABS 0.5* 10/22/2010 1258   MONOABS 0.7 05/17/2013 1828   MONOABS 0.6 10/22/2010 1258   EOSABS 0.3 05/17/2013 1828   EOSABS 0.1 10/22/2010 1258   BASOSABS 0.0 05/17/2013 1828   BASOSABS 0.0 10/22/2010 1258     BMET    Component Value Date/Time   NA 138 05/20/2013 0400   K 4.1 05/20/2013 0400   CL 109 05/20/2013 0400   CO2 21 05/20/2013 0400   GLUCOSE 86 05/20/2013 0400   BUN 4* 05/20/2013 0400   CREATININE 0.55 05/20/2013 0400   CALCIUM 9.9 05/20/2013 0400   CALCIUM 11.9* 05/06/2013 0500   GFRNONAA >90 05/20/2013 0400   GFRAA >90 05/20/2013 0400     Imp- Hemorrhagic cystitis Gross hematuria  Plan - cont supportive care, may need hyperbaric O2 which can improve radiation induced toxicity.

## 2013-05-20 NOTE — Progress Notes (Signed)
DAILY PROGRESS NOTE  Subjective:  No events overnight. Went to OR yesterday for cystoscopy. Was tranfused. H/H remains low.  Objective:  Temp:  [97.9 F (36.6 C)-98.8 F (37.1 C)] 98.3 F (36.8 C) (08/28 0436) Pulse Rate:  [61-71] 68 (08/28 0436) Resp:  [10-18] 18 (08/28 0436) BP: (101-154)/(58-104) 102/64 mmHg (08/28 0436) SpO2:  [100 %] 100 % (08/28 0436) Weight:  [116 lb 13.1 oz (52.989 kg)] 116 lb 13.1 oz (52.989 kg) (08/28 0436) Weight change: -2.9 oz (-0.082 kg)  Intake/Output from previous day: 08/27 0701 - 08/28 0700 In: 1362.5 [P.O.:240; I.V.:800; Blood:322.5] Out: 475 [Urine:475]  Intake/Output from this shift: Total I/O In: 640 [P.O.:640] Out: 150 [Urine:150]  Medications: Current Facility-Administered Medications  Medication Dose Route Frequency Provider Last Rate Last Dose  . acetaminophen (TYLENOL) tablet 650 mg  650 mg Oral Q6H PRN Eduard Clos, MD       Or  . acetaminophen (TYLENOL) suppository 650 mg  650 mg Rectal Q6H PRN Eduard Clos, MD      . multivitamin with minerals tablet 1 tablet  1 tablet Oral Daily Eduard Clos, MD   1 tablet at 05/20/13 1005  . ondansetron (ZOFRAN) tablet 4 mg  4 mg Oral Q6H PRN Eduard Clos, MD       Or  . ondansetron [facility]) injection 4 mg  4 mg Intravenous Q6H PRN Eduard Clos, MD      . ondansetron [facility]) tablet 4 mg  4 mg Oral Q6H Eduard Clos, MD   4 mg at 05/20/13 0545  . oxyCODONE-acetaminophen (PERCOCET/ROXICET) 5-325 MG per tablet 1 tablet  1 tablet Oral Q4H PRN Rhetta Mura, MD   1 tablet at 05/20/13 0951   And  . oxyCODONE (Oxy IR/ROXICODONE) immediate release tablet 5 mg  5 mg Oral Q4H PRN Rhetta Mura, MD   5 mg at 05/20/13 0951  . potassium chloride SA (K-DUR,KLOR-CON) CR tablet 40 mEq  40 mEq Oral BID Rhetta Mura, MD   40 mEq at 05/20/13 1005  . sodium chloride 0.9 % injection 3 mL  3 mL Intravenous Q12H Eduard Clos, MD   3 mL at  05/20/13 1006    Physical Exam: deferred  Lab Results: Results for orders placed during the hospital encounter of 05/17/13 (from the past 48 hour(s))  TYPE AND SCREEN     Status: None   Collection Time    05/18/13  2:18 PM      Result Value Range   ABO/RH(D) A POS     Antibody Screen NEG     Sample Expiration 05/21/2013     Unit Number [id number]     Blood Component Type RED CELLS,LR     Unit division 00     Status of Unit ISSUED,FINAL     Transfusion Status OK TO TRANSFUSE     Crossmatch Result Compatible     Unit Number [id number]     Blood Component Type RED CELLS,LR     Unit division 00     Status of Unit ALLOCATED     Transfusion Status OK TO TRANSFUSE     Crossmatch Result Compatible    CBC     Status: Abnormal   Collection Time    05/18/13  2:18 PM      Result Value Range   WBC 8.9  4.0 - 10.5 K/uL   RBC 2.83 (*) 3.87 - 5.11 MIL/uL   Hemoglobin 7.6 (*) 12.0 - 15.0 g/dL  HCT 22.8 (*) 36.0 - 46.0 %   MCV 80.6  78.0 - 100.0 fL   MCH 26.9  26.0 - 34.0 pg   MCHC 33.3  30.0 - 36.0 g/dL   RDW 16.1 (*) 09.6 - 04.5 %   Platelets 342  150 - 400 K/uL  TYPE AND SCREEN     Status: None   Collection Time    05/19/13  1:30 PM      Result Value Range   ABO/RH(D) A POS     Antibody Screen NEG     Sample Expiration 05/22/2013    PREPARE RBC (CROSSMATCH)     Status: None   Collection Time    05/19/13  7:03 PM      Result Value Range   Order Confirmation ORDER PROCESSED BY BLOOD BANK    CBC     Status: Abnormal   Collection Time    05/19/13  8:10 PM      Result Value Range   WBC 10.7 (*) 4.0 - 10.5 K/uL   RBC 2.71 (*) 3.87 - 5.11 MIL/uL   Hemoglobin 7.4 (*) 12.0 - 15.0 g/dL   HCT 40.9 (*) 81.1 - 91.4 %   MCV 81.9  78.0 - 100.0 fL   MCH 27.3  26.0 - 34.0 pg   MCHC 33.3  30.0 - 36.0 g/dL   RDW 78.2 (*) 95.6 - 21.3 %   Platelets 320  150 - 400 K/uL  BASIC METABOLIC PANEL     Status: None   Collection Time    05/19/13  8:10 PM      Result Value Range    Sodium 137  135 - 145 mEq/L   Potassium 3.6  3.5 - 5.1 mEq/L   Chloride 107  96 - 112 mEq/L   CO2 22  19 - 32 mEq/L   Glucose, Bld 88  70 - 99 mg/dL   BUN 6  6 - 23 mg/dL   Creatinine, Ser 0.86  0.50 - 1.10 mg/dL   Calcium 57.8  8.4 - 46.9 mg/dL   GFR calc non Af Amer >90  >90 mL/min   GFR calc Af Amer >90  >90 mL/min   Comment: (NOTE)     The eGFR has been calculated using the CKD EPI equation.     This calculation has not been validated in all clinical situations.     eGFR's persistently <90 mL/min signify possible Chronic Kidney     Disease.  MAGNESIUM     Status: None   Collection Time    05/19/13  8:10 PM      Result Value Range   Magnesium 1.5  1.5 - 2.5 mg/dL  BASIC METABOLIC PANEL     Status: Abnormal   Collection Time    05/20/13  4:00 AM      Result Value Range   Sodium 138  135 - 145 mEq/L   Potassium 4.1  3.5 - 5.1 mEq/L   Chloride 109  96 - 112 mEq/L   CO2 21  19 - 32 mEq/L   Glucose, Bld 86  70 - 99 mg/dL   BUN 4 (*) 6 - 23 mg/dL   Creatinine, Ser 6.29  0.50 - 1.10 mg/dL   Calcium 9.9  8.4 - 52.8 mg/dL   GFR calc non Af Amer >90  >90 mL/min   GFR calc Af Amer >90  >90 mL/min   Comment: (NOTE)     The eGFR has been calculated using the CKD  EPI equation.     This calculation has not been validated in all clinical situations.     eGFR's persistently <90 mL/min signify possible Chronic Kidney     Disease.  MAGNESIUM     Status: Abnormal   Collection Time    05/20/13  4:00 AM      Result Value Range   Magnesium 1.4 (*) 1.5 - 2.5 mg/dL  CBC     Status: Abnormal   Collection Time    05/20/13  4:00 AM      Result Value Range   WBC 7.2  4.0 - 10.5 K/uL   RBC 2.91 (*) 3.87 - 5.11 MIL/uL   Hemoglobin 8.0 (*) 12.0 - 15.0 g/dL   HCT 91.4 (*) 78.2 - 95.6 %   MCV 82.1  78.0 - 100.0 fL   MCH 27.5  26.0 - 34.0 pg   MCHC 33.5  30.0 - 36.0 g/dL   RDW 21.3 (*) 08.6 - 57.8 %   Platelets 291  150 - 400 K/uL    Imaging: No results  found.  Assessment:  1. Principal Problem: 2.   Syncope 3. Active Problems: 4.   Cervical cancer 5.   Radiation colitis 6.   Hypokalemia on adm- replaced 7.   Gross hematuria 8.   NICM EF of 20% by echo 01/2013-improved 35-40% 05/07/13 9.   Hypotension, unspecified 10.   Anemia- transfused this adm 11.   Cystitis, acute hemorrhagic- cysto 05/19/13 12.   Plan:  1. Have asked EP to evaluate for a monitor, however, she is not sure she "wants to be cut on" again. I explained an inplanted loop recorder was a fairly non-invasive procedure. Hopefully she can discuss this further with EP today. Time Spent Directly with Patient:  15 minutes  Length of Stay:  LOS: 3 days   Chrystie Nose, MD, [facility] Attending Cardiologist [facility]  HILTY,Kenneth C 05/20/2013, 11:23 AM

## 2013-05-20 NOTE — Progress Notes (Signed)
I have seen and examined the pt and agree with PA-Dort's progress note.  

## 2013-05-20 NOTE — Progress Notes (Signed)
I have contacted [facility] for EP consult.  Corine Shelter PA-C 05/20/2013 11:37 AM

## 2013-05-20 NOTE — Consult Note (Signed)
ELECTROPHYSIOLOGY CONSULT NOTE  Patient ID: Vanessa [name] MRN: [id number], DOB/AGE: March 10, 1976   Admit date: 05/17/2013 Date of Consult: 05/21/2013  Primary Physician: Dorrene German, MD Primary Cardiologist: Thurmon Fair, MD Reason for Consultation: Syncope  History of Present Illness [name] is a 37 y.o. 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  She had a 2D echo May 2014 which revealed severe LV dysfunction, EF 20%. She then underwent a cardiac MRI which revealed no evidence of coronary disease or anomalies or obvious etiology for her cardiomyopathy. She was given a LifeVest. Repeat echo August 2014 showed LVEF 35-40%.   She presented on 05/18/2013 after experiencing an episode of syncope. On admission, she was found to have acute hemorrhagic cystitis and was anemic with Hgb 8.1. She was also hypokalemic with a potassium of 2.7.  [name] reports she experienced brief LOC while sitting at the kitchen table talking with her aunt. She denies any stressful or traumatic event prior to the episode and states it was just a typical large family cookout. She had been feeling well and became thirsty just prior to LOC. She states her daughter had given her water and after taking a few sips she "just slid to the floor." She remembers hearing her family members call her name but she was unable to respond. She states she was unresponsive for a few seconds. After regaining consciousness, she remembers feeling weak and dizzy, unable to stand or sit up for several minutes. She also noticed she was quite nauseated and vomited x 1. She was "flushed" and diaphoretic as well. She has not experienced syncope in the past. She denies  CP, SOB or palpitations. Her family called 911 and on EMS arrival they were unable to obtain a BP initially. She was then transported to [facility] for further evaluation.    Past Medical History Past Medical History  Diagnosis Date  . History of blood transfusion     "I had ~ 7 in 01/2012"  . Anemia   . Radiation Nov.18,2011-Jan.23,2012    cervix  . Hypertension     no treatment necessary for BP  since July 2013  . Non-ischemic cardiomyopathy, EF 25 % 02/14/2013  . At risk for sudden cardiac death 18-Feb-2013    hs life vest  . CHF (congestive heart failure)   . Dysrhythmia     WEARS LIFE VEST (DR. C [facility])  . Cervical cancer ~ 2011    cervical    Past Surgical History Past Surgical History  Procedure Laterality Date  . Radiation implants  ~ 2011    "for cervical cancer"  . Colonoscopy  01/30/2012    Procedure: COLONOSCOPY;  Surgeon: Beverley Fiedler, MD;  Location: [facility] ENDOSCOPY;  Service: Gastroenterology;  Laterality: N/A;  . Colonoscopy  01/30/2012    Procedure: COLONOSCOPY;  Surgeon: Beverley Fiedler, MD;  Location: [facility] OR;  Service: Gastroenterology;  Laterality: N/A;  . Colostomy revision  02/04/2012    Procedure: COLON RESECTION SIGMOID;  Surgeon: Cherylynn Ridges, MD;  Location: [facility] OR;  Service: General;  Laterality: N/A;  . Cholecystectomy  2006  . Laparotomy  08/11/2012    Procedure: EXPLORATORY LAPAROTOMY;  Surgeon: Cherylynn Ridges, MD;  Location: [facility] OR;  Service: General;  Laterality: N/A;  . Laparoscopic lysis  of adhesions  08/11/2012    Procedure: LAPAROSCOPIC LYSIS OF ADHESIONS;  Surgeon: Cherylynn Ridges, MD;  Location: [facility] OR;  Service: General;  Laterality: N/A;  . Bowel resection  08/11/2012    Procedure: SMALL BOWEL RESECTION;  Surgeon: Cherylynn Ridges, MD;  Location: [facility] OR;  Service: General;  Laterality: N/A;  . Colostomy  08/11/2012    Procedure: COLOSTOMY;  Surgeon: Cherylynn Ridges, MD;  Location: [facility] OR;  Service: General;  Laterality: N/A;  . Colostomy revision N/A 02/03/2013     Procedure: COLOSTOMY REVISION;  Surgeon: Cherylynn Ridges, MD;  Location: [facility] OR;  Service: General;  Laterality: N/A;  . Colostomy revision N/A 04/16/2013    Procedure: COLOSTOMY REVISION, REPAIR COLOSTOMY FISTULA;  Surgeon: Cherylynn Ridges, MD;  Location: [facility] OR;  Service: General;  Laterality: N/A;  . Laparotomy N/A 04/16/2013    Procedure: EXPLORATORY LAPAROTOMY,Partial Colectomy,Small bowel resection;  Surgeon: Cherylynn Ridges, MD;  Location: [facility] OR;  Service: General;  Laterality: N/A;  . Appendectomy N/A 04/16/2013    Procedure: APPENDECTOMY;  Surgeon: Cherylynn Ridges, MD;  Location: [facility] OR;  Service: General;  Laterality: N/A;  . Cystoscopy w/ retrogrades Bilateral 05/19/2013    Procedure: CYSTOSCOPY WITH BILATERAL RETROGRADE PYELOGRAM/CLOT EVACUATION;  Surgeon: Martina Sinner, MD;  Location: [facility] ORS;  Service: Urology;  Laterality: Bilateral;    Allergies/Intolerances Allergies  Allergen Reactions  . Penicillins Other (See Comments)    "anything w/penicillin in it makes me bleed"  . Labetalol Hcl Itching and Other (See Comments)    Bumps to bilateral arms.  . Morphine And Related Hives    Inpatient Medications . multivitamin with minerals  1 tablet Oral Daily  . ondansetron  4 mg Oral Q6H  . potassium chloride  40 mEq Oral BID  . sodium chloride  3 mL Intravenous Q12H    Family History Negative for sudden cardiac death Family History  Problem Relation Age of Onset  . Hypertension Mother   . Healthy Father   . Healthy Brother   . Healthy Brother      Social History Social History  . Marital Status: Single   Social History Main Topics  . Smoking status: Former Smoker -- 0.50 packs/day for .5 years    Types: Cigarettes    Quit date: 01/26/2012  . Smokeless tobacco: Never Used  . Alcohol Use: 0.0 oz/week     Comment: last use 2010  . Drug Use: Yes    Special: Marijuana   Review of Systems General: No chills, fever, night sweats or weight changes  Cardiovascular:  No chest  pain, dyspnea on exertion, edema, orthopnea, palpitations, paroxysmal nocturnal dyspnea Dermatological: No rash, lesions or masses Respiratory: No cough, dyspnea Urologic: No hematuria, dysuria Abdominal: No nausea, vomiting, diarrhea, bright red blood per rectum, melena, or hematemesis Neurologic: No visual changes All other systems reviewed and are otherwise negative except as noted above.  Physical Exam Vitals: Blood pressure 129/78, pulse 60, temperature 98.7 F (37.1 C), temperature source Oral, resp. rate 18, height 5\' 5"  (1.651 m), weight 116 lb 14.4 oz (53.025 kg), SpO2 100.00%.  General: Well developed, thin 37 y.o. female in no acute distress. HEENT: Normocephalic, atraumatic. EOMs intact. Sclera nonicteric. Oropharynx clear.  Neck: Supple. No JVD. Lungs: Respirations regular and unlabored, CTA bilaterally. No wheezes, rales or rhonchi. Heart: RRR. S1, S2 present. No murmurs, rub, S3 or S4. Abdomen: Soft, non-tender, non-distended. BS present x 4 quadrants. No hepatosplenomegaly.  Extremities: No clubbing, cyanosis  or edema. DP/PT/Radials 2+ and equal bilaterally. Psych: Normal affect. Neuro: Alert and oriented X 3. Moves all extremities spontaneously. Musculoskeletal: No kyphosis. Skin: Intact. Warm and dry. No rashes or petechiae in exposed areas.   Labs Lab Results  Component Value Date   WBC 8.0 05/21/2013   HGB 9.8* 05/21/2013   HCT 28.8* 05/21/2013   MCV 83.2 05/21/2013   PLT 283 05/21/2013     Recent Labs Lab 05/21/13 0500  NA 137  K 4.8  CL 106  CO2 24  BUN 6  CREATININE 0.66  CALCIUM 10.6*  GLUCOSE 88   Lab Results  Component Value Date   DDIMER 0.77* 05/17/2013    Radiology/Studies Ct Angio Chest Pe W/cm &/or Wo Cm 05/17/2013   *RADIOLOGY REPORT*    CHEST: Findings: Pulmonary arteries are patent.  Normal caliber aorta and branch vessels.  Cardiomegaly.  No pleural or pericardial effusion. Mildly prominent left axillary lymph node measuring 1 cm short  axis on image 93 series 6. This has developed since the prior PET CT. Otherwise, no intrathoracic lymphadenopathy.  Central airways are patent.  No pneumothorax.  Linear opacity within the right lower lobe, favored to reflect atelectasis or scar.  Unchanged 3 mm nodule right lung base, may be vascular. No acute osseous finding.  IMPRESSION: No pulmonary embolism or acute intrathoracic process.  Nonspecific enlarged left axillary lymph node.  Consider tissue sampling.  Mild cardiomegaly/left ventricular enlargement.  Correlate with echocardiogram.    Ct Abdomen Pelvis W Contrast 05/17/2013   *RADIOLOGY REPORT*   ABDOMEN/PELVIS Findings: Unremarkable liver, spleen, pancreas.  Cholecystectomy. No biliary ductal dilatation.  Unremarkable adrenal glands.  Symmetric renal enhancement.  Nonobstructing lower pole stone on the left.  Pancolitis and left lower quadrant colostomy.  No bowel obstruction.  Surgical drain terminates within the midline anterior abdomen.  Complex peripherally enhancing collection within the pelvis tracks superiorly, inseparable from adjacent loops of bowel. The amount of extraluminal gas has decreased.  Surgical anastomotic suture right lower quadrant. Open midline laparotomy.  Normal caliber aorta and branch vessels.  Circumferential bladder wall thickening.  Intraluminal high attenuation presumably reflects clot.  Uterus is presumably absent with irregularly enhancing soft tissue within the pelvis, similar to prior.  No interval osseous change.  IMPRESSION:  Postoperative changes of left lower quadrant colostomy. Nonspecific pancolitis.  No small bowel obstruction.  Pelvic abscess persists though has decreased in size, with the largest component measuring 2.1 x 1.1 cm within the left anterior pelvis.  Circumferential bladder wall thickening.  Interval intraluminal high attenuation presumably reflects blood clot.  Correlate with urinalysis.  May reflect cystitis including radiation cystitis.   Uterus is presumably absent with irregularly enhancing soft tissue within the pelvis, similar to prior.   Original Report Authenticated By: Jearld Lesch, M.D.    Echocardiogram May 07, 2013 Left ventricle: The cavity size was mildly dilated. Systolic function was moderately reduced. The estimated ejection fraction was in the range of 35% to 40%. Diffuse hypokinesis. The tissue Doppler parameters were normal. There was no evidence of elevated ventricular filling pressure by Doppler parameters. ------------------------------------------------------------ Aortic valve: Structurally normal valve. Trileaflet. Cusp separation was normal. Doppler: Transvalvular velocity was within the normal range. There was no stenosis. No regurgitation. ------------------------------------------------------------- Mitral valve: Mildly thickened leaflets . Leaflet separation was normal. Doppler: Transvalvular velocity was within the normal range. There was no evidence for stenosis. Trivial regurgitation. ------------------------------------------------------------ Left atrium: The atrium was normal in size. ------------------------------------------------------------ Atrial septum: The interatrial septum was hypermobile. ------------------------------------------------------------  Right ventricle: The cavity size was normal. Systolic function was normal. Systolic pressure was increased. ------------------------------------------------------------ Pulmonic valve: The valve appears to be grossly normal. ------------------------------------------------------------ Tricuspid valve: Doppler: Mild regurgitation. ------------------------------------------------------------ Pulmonary artery: The main pulmonary artery was normal-sized. ------------------------------------------------------------ Right atrium: The atrium was normal in size. The Eustachian valve appeared  prominant. ------------------------------------------------------------ Pericardium: There was no pericardial effusion. ------------------------------------------------------------ Systemic veins: Inferior vena cava: The vessel was normal in size; the respirophasic diameter changes were in the normal range (= 50%); findings are consistent with normal central venous pressure.   12-lead ECG on admission shows SR, LVH with inferolateral ST depression, question strain pattern laterally Telemetry shows SR with rare PVCs, one brief run of ventricular bigeminy on 05/18/2013 which was asymptomatic   Assessment and Plan 1. Syncope 2. NICM, EF 20% May 2014, now 35-40% this admission 3. Anemia 4. Acute hemorrhagic cystitis 5. Hypokalemia  The patient has a nonischemic CM (EF 35%) and syncope of unclear etiology.  I would strongly suspect an arrythmia as the cause.  I would therefore recommend ICD implantation at this time.  Risks, benefits, alternatives to ICD implantation were discussed in detail with the patient today.  She is very clear in her decision to decline ICD implantation despite my recommendation to do so.  She wants to "take (her) chances".  She will continue to follow with Dr Royann Shivers for medical management of her nonischemic CM.  I have instructed her to not drive for 6 months following syncope.  She will contact my office if she wishes to proceed with ICD implantation in the future.  I will see as needed while here. Please call with questions.

## 2013-05-20 NOTE — Progress Notes (Addendum)
[facility] PROGRESS NOTE  Vanessa [name] ZOX:[id number] DOB: September 13, 1976 DOA: 05/17/2013 PCP: Dorrene German, MD  Assessment/Plan: Principal Problem:   Syncope Active Problems:   Cervical cancer   Radiation colitis   Hypokalemia on adm- replaced   Gross hematuria   NICM EF of 20% by echo 01/2013-improved 35-40% 05/07/13   Hypotension, unspecified   Anemia- transfused this adm   Cystitis, acute hemorrhagic- cysto 05/19/13    1. Syncope: Patient had a syncopal episode on day of presentation, raising the specter of vasovagal episode secondary to ABLA/orthostasis, vs arrhythmia. She has had no recurrence. Telemetric monitoring failed to reveal any culprit arrhythmias. See further discussion in # 5 below. .  2. Gross hematuria with clots/Anemia: Patient developed gross hematuria with blood clots on 05/17/13. HB on admission was 10.2. Following serial CBCs, and HB dropped progressively. Dr Mena Goes provided urology consultation, and patient underwent cystoscopy, evacuation of bladder clots and bilateral retrograde ureterogram by Dr. Lorin Picket MacDiarmid on 05/19/13. Patient appears to have a hemorrhagic cystitis. As HB was 7.6 on 05/20/13, transfused a total of 2 units PRBC.  3. H/O stage IB-2 squamous cell CA cancer, status post brachy therapy: Currently stable. For outpatient surveillance with oncology/Radiation oncology. 4. H/o DVT: Patient is s/p anticoagulation for about 8 weeks and then this was discontinued. 5. Nonischemic cardiomyopathy: Patient had known low EF of 20% originally, which improved to 30% on most recent MRI. Apparently, cardiac MRI last visit showed no structural anomaly-appreciate cardiology,. Dr Nanetta Batty provided cardiology consultation, and patient was subsequently seen by Dr Rennis Golden who felt that she may be a good candidate for a Linq monitor, or if ventricular arrhythmias are documented, an ICD may be indicated. EPS consulted. Awaiting recommendations.  6.  Hypertension: Patient has a known history of HTN, but SBP has been borderline-low, since hospitalization. Following BP, and holding pre-admission antihypertensives. BP is reasonable today.  7. Hypokalemia: Repleting as indicated.  8. Ostomy: Consulted WOC. Managing as recommended.  Patient has multiple surgical issues, s/p multiple complex surgeries, and has Ostomy and percutaneous abscess drain. Appears clinically stable, and has follow up scheduled with Dr [name] on 06/01/13. Dr Darlys Gales al, informed of hospitalization.    Code Status: Full Code.  Family Communication:  Disposition Plan: To be determined.    Brief narrative: Unfrotunate 37 y/o female, known Cervical Ca, s/p Radiation therapy 2013, Radiation  colitis, HTN, Eclampsia, Colostomy/revision and Cholecystectomy, recent admission for reversal, complicated by large intra-abd fluid/Gas/Abd abcess with possible SB dehiscence. Hospital course complicated by Hypotension, But repeat 2D Echocardiogram 05/06/13, significantly better (went from 20%-40-45) found to be improved and discharged home.  Patient presented to the hospital 05/17/2013 following a syncopal episode and was noted to have dark urine as well as gross hematuria with clots. Admitted for further investigation and management.    Consultants:  Dr Nanetta Batty, cardiologist.  Dr Jerilee Field, urologist.  Dr Lorin Picket McDarmid, urologist.   Procedures:  See notes.   Antibiotics:  N/A.   HPI/Subjective: Asymptomatic. Feels considerably better.    Objective: Vital signs in last 24 hours: Temp:  [97.9 F (36.6 C)-98.8 F (37.1 C)] 98.3 F (36.8 C) (08/28 0436) Pulse Rate:  [61-71] 68 (08/28 0436) Resp:  [10-18] 18 (08/28 0436) BP: (101-154)/(58-104) 102/64 mmHg (08/28 0436) SpO2:  [100 %] 100 % (08/28 0436) Weight:  [52.989 kg (116 lb 13.1 oz)] 52.989 kg (116 lb 13.1 oz) (08/28 0436) Weight change: -0.082 kg (-2.9 oz) Last BM Date: 05/19/13  (colostomy)  Intake/Output from previous day: 08/27 0701 - 08/28 0700 In: 1362.5 [P.O.:240; I.V.:800; Blood:322.5] Out: 475 [Urine:475] Total I/O In: 640 [P.O.:640] Out: 150 [Urine:150]   Physical Exam: General: Comfortable, alert, communicative, fully oriented, not short of breath at rest.  HEENT:  Moderate clinical pallor, no jaundice, no conjunctival injection or discharge. NECK:  Supple, JVP not seen, no carotid bruits, no palpable lymphadenopathy, no palpable goiter. CHEST:  Clinically clear to auscultation, no wheezes, no crackles. HEART:  Sounds 1 and 2 heard, normal, regular, no murmurs. ABDOMEN:  Full, soft, non-tender, no palpable organomegaly, no palpable masses, normal bowel sounds. Has functioning colostomy bag. Percutaneous drain is in RLQ.  GENITALIA:  Not examined. LOWER EXTREMITIES:  No pitting edema, palpable peripheral pulses. MUSCULOSKELETAL SYSTEM:  Unremarkable. CENTRAL NERVOUS SYSTEM:  No focal neurologic deficit on gross examination.  Lab Results:  Recent Labs  05/20/13 0400 05/20/13 1130  WBC 7.2 7.8  HGB 8.0* 8.0*  HCT 23.9* 24.4*  PLT 291 284    Recent Labs  05/19/13 2010 05/20/13 0400  NA 137 138  K 3.6 4.1  CL 107 109  CO2 22 21  GLUCOSE 88 86  BUN 6 4*  CREATININE 0.63 0.55  CALCIUM 10.0 9.9   Recent Results (from the past 240 hour(s))  URINE CULTURE     Status: None   Collection Time    05/17/13  8:30 PM      Result Value Range Status   Specimen Description URINE, CLEAN CATCH   Final   Special Requests NONE   Final   Culture  Setup Time     Final   Value: 05/18/2013 07:26     Performed at [facility] Count     Final   Value: NO GROWTH     Performed at [facility]   Culture     Final   Value: NO GROWTH     Performed at [facility]   Report Status 05/19/2013 FINAL   Final     Studies/Results: No results found.  Medications: Scheduled Meds: . multivitamin with minerals  1 tablet  Oral Daily  . ondansetron  4 mg Oral Q6H  . potassium chloride  40 mEq Oral BID  . sodium chloride  3 mL Intravenous Q12H   Continuous Infusions:  PRN Meds:.acetaminophen, acetaminophen, ondansetron (ZOFRAN) IV, ondansetron, oxyCODONE, oxyCODONE-acetaminophen    LOS: 3 days   Jude Naclerio,CHRISTOPHER  [facility] Pager [phone]. If 8PM-8AM, please contact night-coverage at [email or web address], password [facility] 05/20/2013, 1:13 PM  LOS: 3 days

## 2013-05-21 LAB — CBC
HCT: 28.8 % — ABNORMAL LOW (ref 36.0–46.0)
HCT: 29.6 % — ABNORMAL LOW (ref 36.0–46.0)
Hemoglobin: 9.8 g/dL — ABNORMAL LOW (ref 12.0–15.0)
Hemoglobin: 10 g/dL — ABNORMAL LOW (ref 12.0–15.0)
MCH: 27.9 pg (ref 26.0–34.0)
MCV: 82.7 fL (ref 78.0–100.0)
RBC: 3.46 MIL/uL — ABNORMAL LOW (ref 3.87–5.11)
RBC: 3.58 MIL/uL — ABNORMAL LOW (ref 3.87–5.11)
WBC: 8 10*3/uL (ref 4.0–10.5)

## 2013-05-21 LAB — BASIC METABOLIC PANEL
BUN: 6 mg/dL (ref 6–23)
Chloride: 106 mEq/L (ref 96–112)
GFR calc Af Amer: >90 mL/min (ref >90)
Potassium: 4.8 mEq/L (ref 3.5–5.1)
Sodium: 137 mEq/L (ref 135–145)

## 2013-05-21 LAB — TYPE AND SCREEN
ABO/RH(D): A POS
Antibody Screen: NEGATIVE
Unit division: 0

## 2013-05-21 MED ORDER — HYDROMORPHONE HCL PF 1 MG/ML IJ SOLN
0.5000 mg | INTRAMUSCULAR | Status: DC | PRN
  Administered 2013-05-21 – 2013-05-24 (×13): 0.5 mg via INTRAVENOUS
  Filled 2013-05-21 (×13): qty 1

## 2013-05-21 MED ORDER — PROMETHAZINE HCL 25 MG/ML IJ SOLN
12.5000 mg | Freq: Four times a day (QID) | INTRAMUSCULAR | Status: DC | PRN
  Administered 2013-05-21 – 2013-05-22 (×3): 12.5 mg via INTRAVENOUS
  Filled 2013-05-21 (×2): qty 1

## 2013-05-21 MED ORDER — HYDROMORPHONE HCL PF 1 MG/ML IJ SOLN
INTRAMUSCULAR | Status: AC
  Filled 2013-05-21: qty 1

## 2013-05-21 NOTE — Progress Notes (Signed)
Pharmacist Heart Failure Core Measure Documentation  Assessment: Vanessa [name] has an EF documented as 35-40% on 8/15 by Echo (Imrpoved from Echo on 01/30/2013 which revealed EF of 20% and CHF).  Pro BNP on 8/25 = 826.2  Rationale: Heart failure patients with left ventricular systolic dysfunction (LVSD) and an EF < 40% should be prescribed an angiotensin converting enzyme inhibitor (ACEI) or angiotensin receptor blocker (ARB) at discharge unless a contraindication is documented in the medical record.  This patient is not currently on an ACEI or ARB for HF.  Currently she has no noted clinical signs of CHF per cardiology and she is awaiting EP recommendations.  Consider ACE or ARB for HF if you feel she meets criteria and would benefit.  Nadara Mustard, PharmD., MS Clinical Pharmacist Pager:  [phone] Thank you for allowing pharmacy to be part of this patients care team. 05/21/2013 3:29 PM

## 2013-05-21 NOTE — Progress Notes (Signed)
[facility]  Subjective: No complaints. Denies syncope/near syncope, palpitations, SOB and chest pain.   Objective: Vital signs in last 24 hours: Temp:  [98.3 F (36.8 C)-98.8 F (37.1 C)] 98.7 F (37.1 C) (08/29 0611) Pulse Rate:  [60-72] 60 (08/29 0611) Resp:  [16-18] 18 (08/28 2111) BP: (121-140)/(78-92) 129/78 mmHg (08/29 0611) SpO2:  [100 %] 100 % (08/29 0611) Weight:  [116 lb 14.4 oz (53.025 kg)] 116 lb 14.4 oz (53.025 kg) (08/29 0611) Last BM Date: 05/20/13 (colostomy)  Intake/Output from previous day: 08/28 0701 - 08/29 0700 In: 1710 [P.O.:1360; Blood:350] Out: 350 [Urine:350] Intake/Output this shift:    Medications Current Facility-Administered Medications  Medication Dose Route Frequency Provider Last Rate Last Dose  . acetaminophen (TYLENOL) tablet 650 mg  650 mg Oral Q6H PRN Eduard Clos, MD       Or  . acetaminophen (TYLENOL) suppository 650 mg  650 mg Rectal Q6H PRN Eduard Clos, MD      . multivitamin with minerals tablet 1 tablet  1 tablet Oral Daily Eduard Clos, MD   1 tablet at 05/21/13 1001  . ondansetron (ZOFRAN) tablet 4 mg  4 mg Oral Q6H PRN Eduard Clos, MD       Or  . ondansetron [facility]) injection 4 mg  4 mg Intravenous Q6H PRN Eduard Clos, MD      . ondansetron [facility]) tablet 4 mg  4 mg Oral Q6H Eduard Clos, MD   4 mg at 05/21/13 1610  . oxyCODONE-acetaminophen (PERCOCET/ROXICET) 5-325 MG per tablet 1 tablet  1 tablet Oral Q4H PRN Rhetta Mura, MD   1 tablet at 05/21/13 9604   And  . oxyCODONE (Oxy IR/ROXICODONE) immediate release tablet 5 mg  5 mg Oral Q4H PRN Rhetta Mura, MD   5 mg at 05/21/13 5409  . potassium chloride SA (K-DUR,KLOR-CON) CR tablet 40 mEq  40 mEq Oral BID Rhetta Mura, MD   40 mEq at 05/21/13 1001  . sodium chloride 0.9 % injection 3 mL  3 mL Intravenous Q12H Eduard Clos, MD   3 mL at 05/21/13 1001    PE: General  appearance: alert, cooperative and no distress Lungs: clear to auscultation bilaterally Heart: regular rate and rhythm Extremities: no LEE Pulses: 2+ and symmetric Skin: warm and dry Neurologic: Grossly normal  Lab Results:   Recent Labs  05/20/13 1130 05/20/13 2344 05/21/13 0500  WBC 7.8 7.8 8.0  HGB 8.0* 9.1* 9.8*  HCT 24.4* 26.8* 28.8*  PLT 284 266 283   BMET  Recent Labs  05/19/13 2010 05/20/13 0400 05/21/13 0500  NA 137 138 137  K 3.6 4.1 4.8  CL 107 109 106  CO2 22 21 24   GLUCOSE 88 86 88  BUN 6 4* 6  CREATININE 0.63 0.55 0.66  CALCIUM 10.0 9.9 10.6*      Assessment/Plan  Principal Problem:   Syncope Active Problems:   Cervical cancer   Radiation colitis   Hypokalemia on adm- replaced   Gross hematuria   NICM EF of 20% by echo 01/2013-improved 35-40% 05/07/13   Hypotension, unspecified   Anemia- transfused this adm   Cystitis, acute hemorrhagic- cysto 05/19/13  Plan: No further syncope and no presyncopal like symptoms. Maintaining NSR on telemetry w/o arrhthymias. HR and BP both stable. H/H trending upward at 9.8/28.8. Still awaiting EP consult to be completed to see if pt will need a loop recorder prior to discharge. Will continue to  monitor.     LOS: 4 days    Brittainy M. Sharol Harness, PA-C 05/21/2013 10:04 AM  I have seen and examined the patient along with Brittainy M. Sharol Harness, PA-C.  I have reviewed the chart, notes and new data.  I agree with PA's note.  Key new complaints: no complaints Key examination changes: no clinical signs CHF Key new findings / data: no arrhythmia on monitor  PLAN: Suspect hypotensive syncope by history, but agree with concerns about arrhythmia with known cardiomyopathy, hypokalemia on admission. Will await for EP service recommendations.  Thurmon Fair, MD, [facility] [phone] 05/21/2013, 10:55 AM

## 2013-05-21 NOTE — Progress Notes (Signed)
[facility] PROGRESS NOTE  Vanessa [name] UJW:[id number] DOB: 1975/10/24 DOA: 05/17/2013 PCP: Dorrene German, MD  Assessment/Plan: Principal Problem:   Syncope Active Problems:   Cervical cancer   Radiation colitis   Hypokalemia on adm- replaced   Gross hematuria   NICM EF of 20% by echo 01/2013-improved 35-40% 05/07/13   Hypotension, unspecified   Anemia- transfused this adm   Cystitis, acute hemorrhagic- cysto 05/19/13    1. Syncope: Patient had a syncopal episode on day of presentation, raising the specter of vasovagal episode secondary to ABLA/orthostasis, vs arrhythmia. She had no recurrence during hospitalization. Telemetric monitoring failed to reveal any culprit arrhythmias. See further discussion in # 5 below. .  2. Gross hematuria with clots/Anemia: Patient developed gross hematuria with blood clots on 05/17/13. HB on admission was 10.2. Serial CBCs were followed. Dr Mena Goes provided urology consultation, and subsequently patient underwent cystoscopy, evacuation of bladder clots and bilateral retrograde ureterogram by Dr. Alfredo Martinez on 05/19/13. Patient appears to have a hemorrhagic cystitis. As HB was 7.6 on 05/20/13, she was transfused a total of 2 units PRBC, with bump in HB to 10.0 on 05/21/13. As of that date, hematuria had resolved.  3. H/O stage IB-2 squamous cell CA cancer, status post brachytherapy: Currently stable. For outpatient surveillance with oncology/Radiation oncology. 4. H/O DVT: Patient is s/p anticoagulation for about 8 weeks and then this was discontinued. 5. Nonischemic cardiomyopathy: Patient had known low EF of 20% originally, which improved to 30% on most recent MRI. Apparently, cardiac MRI last visit showed no structural anomaly. Nanetta Batty provided cardiology consultation, and patient was subsequently seen by Dr Rennis Golden who felt that she may be a good candidate for a Linq monitor, or if ventricular arrhythmias are documented, an ICD may be  indicated. Dr Hillis Range provided EP consultation, and has recommended ICD implantation, which patient has declined, despite detailed discussion with Dr Johney Frame.  6. Hypertension: Patient has a known history of HTN, but SBP borderline-low in the initial days of hospitalization, and pre-admission antihypertensives were therefore, held. As of 05/20/13, BP had normalized.  7. Hypokalemia: Repleted as indicated.  8. Complex surgical history/Ostomy: Patient is s/p left/sigmoid colectomy with anastomotic leak- ileostomy 04/16/13 by Dr. [name], entero-colonic fistula/colostomy prolapse, with revision and has a percutaneous abscess drain. Appears clinically stable, WOC assisted with ostomy management, and patient was seen by Dr Axel Filler, surgeon, on 8/28/154. She is recommended to keep follow up appointment scheduled with Dr [name] on 06/01/13.    Code Status: Full Code.  Family Communication:  Disposition Plan: To be determined.    Brief narrative: Unfrotunate 37 y/o female, known Cervical Ca, s/p Radiation therapy 2013, Radiation  colitis, HTN, Eclampsia, Colostomy/revision and Cholecystectomy, recent admission for reversal, complicated by large intra-abd fluid/Gas/Abd abcess with possible SB dehiscence. Hospital course complicated by Hypotension, But repeat 2D Echocardiogram 05/06/13, significantly better (went from 20%-40-45) found to be improved and discharged home.  Patient presented to the hospital 05/17/2013 following a syncopal episode and was noted to have dark urine as well as gross hematuria with clots. Admitted for further investigation and management.    Consultants:  Dr Nanetta Batty, cardiologist.  Dr Jerilee Field, urologist.  Dr Lorin Picket McDarmid, urologist.   Axel Filler, surgeon.   Procedures:  See notes.   Antibiotics:  N/A.   HPI/Subjective: Asymptomatic.    Objective: Vital signs in last 24 hours: Temp:  [97.7 F (36.5 C)-98.7 F (37.1 C)] 97.7 F (36.5  C) (08/29 1439) Pulse Rate:  [  60-72] 66 (08/29 1439) Resp:  [17-18] 17 (08/29 1439) BP: (121-140)/(76-92) 121/76 mmHg (08/29 1439) SpO2:  [100 %] 100 % (08/29 1439) Weight:  [53.025 kg (116 lb 14.4 oz)] 53.025 kg (116 lb 14.4 oz) (08/29 0611) Weight change: 0.037 kg (1.3 oz) Last BM Date: 05/20/13 (colostomy)  Intake/Output from previous day: 08/28 0701 - 08/29 0700 In: 1710 [P.O.:1360; Blood:350] Out: 350 [Urine:350] Total I/O In: 500 [P.O.:500] Out: -    Physical Exam: General: Comfortable, alert, communicative, fully oriented, not short of breath at rest.  HEENT:  Moderate clinical pallor, no jaundice, no conjunctival injection or discharge. NECK:  Supple, JVP not seen, no carotid bruits, no palpable lymphadenopathy, no palpable goiter. CHEST:  Clinically clear to auscultation, no wheezes, no crackles. HEART:  Sounds 1 and 2 heard, normal, regular, no murmurs. ABDOMEN:  Full, soft, non-tender, no palpable organomegaly, no palpable masses, normal bowel sounds. Has functioning colostomy bag. Percutaneous drain is in RLQ.  GENITALIA:  Not examined. LOWER EXTREMITIES:  No pitting edema, palpable peripheral pulses. MUSCULOSKELETAL SYSTEM:  Unremarkable. CENTRAL NERVOUS SYSTEM:  No focal neurologic deficit on gross examination.  Lab Results:  Recent Labs  05/21/13 0500 05/21/13 1240  WBC 8.0 7.6  HGB 9.8* 10.0*  HCT 28.8* 29.6*  PLT 283 296    Recent Labs  05/20/13 0400 05/21/13 0500  NA 138 137  K 4.1 4.8  CL 109 106  CO2 21 24  GLUCOSE 86 88  BUN 4* 6  CREATININE 0.55 0.66  CALCIUM 9.9 10.6*   Recent Results (from the past 240 hour(s))  URINE CULTURE     Status: None   Collection Time    05/17/13  8:30 PM      Result Value Range Status   Specimen Description URINE, CLEAN CATCH   Final   Special Requests NONE   Final   Culture  Setup Time     Final   Value: 05/18/2013 07:26     Performed at [facility] Count     Final   Value: NO  GROWTH     Performed at [facility]   Culture     Final   Value: NO GROWTH     Performed at [facility]   Report Status 05/19/2013 FINAL   Final     Studies/Results: No results found.  Medications: Scheduled Meds: . multivitamin with minerals  1 tablet Oral Daily  . ondansetron  4 mg Oral Q6H  . potassium chloride  40 mEq Oral BID  . sodium chloride  3 mL Intravenous Q12H   Continuous Infusions:  PRN Meds:.acetaminophen, acetaminophen, HYDROmorphone (DILAUDID) injection, ondansetron (ZOFRAN) IV, ondansetron, oxyCODONE, oxyCODONE-acetaminophen    LOS: 4 days   Joncarlo Friberg,CHRISTOPHER  [facility] Pager [phone]. If 8PM-8AM, please contact night-coverage at [email or web address], password [facility] 05/21/2013, 3:13 PM  LOS: 4 days

## 2013-05-21 NOTE — Clinical Documentation Improvement (Signed)
THIS DOCUMENT IS NOT A PERMANENT PART OF THE MEDICAL RECORD  Please update your documentation with the medical record to reflect your response to this query. If you need help knowing how to do this please call [phone].  05/21/13   Dear Dr. Brien Few,   Chart documentation states patient admitted with "Acute Hemorrhagic Cystitis". Patient also has radiation colitis. Per 8/25 CT findings: "May reflect cystitis including radiation cystitis."  Please clarify in record if "Acute Hemorrhagic Cystitis" is radiation exposure linked. Thank you.  You may use possible, probable, or suspect with inpatient documentation. possible, probable, suspected diagnoses MUST be documented at the time of discharge.  Reviewed:  no additional documentation provided  Thank You,  Beverley Fiedler RN BSN Clinical Documentation Specialist: [phone] Health Information Management [place]  Unfortunately Jacki Cones, I am unable to clarify further, based on available information. Perhaps, you should ask Dr Mena Goes.   C. Lemar Bakos.

## 2013-05-21 NOTE — Progress Notes (Signed)
Comment:  Patient developed stomach cramps and vomiting after eating [place] soup, brought by family, for lunch. No diarrhea.    Plan: 1. Managing with bowel rest, antiemetics and analgesics.  2. Discharge deferred till tomorrow.   C. Moise Friday. MD, FACP.

## 2013-05-22 LAB — CBC
HCT: 30.5 % — ABNORMAL LOW (ref 36.0–46.0)
HCT: 30.7 % — ABNORMAL LOW (ref 36.0–46.0)
HCT: 32.5 % — ABNORMAL LOW (ref 36.0–46.0)
Hemoglobin: 10.8 g/dL — ABNORMAL LOW (ref 12.0–15.0)
MCH: 28.2 pg (ref 26.0–34.0)
MCH: 28.1 pg (ref 26.0–34.0)
MCHC: 32.6 g/dL (ref 30.0–36.0)
MCHC: 33.2 g/dL (ref 30.0–36.0)
MCHC: 33.4 g/dL (ref 30.0–36.0)
MCV: 84 fL (ref 78.0–100.0)
Platelets: 295 10*3/uL (ref 150–400)
RBC: 3.59 MIL/uL — ABNORMAL LOW (ref 3.87–5.11)
RDW: 18.1 % — ABNORMAL HIGH (ref 11.5–15.5)
RDW: 18.5 % — ABNORMAL HIGH (ref 11.5–15.5)
RDW: 18.6 % — ABNORMAL HIGH (ref 11.5–15.5)
RDW: 18.7 % — ABNORMAL HIGH (ref 11.5–15.5)
WBC: 7.1 10*3/uL (ref 4.0–10.5)
WBC: 4.9 10*3/uL (ref 4.0–10.5)

## 2013-05-22 LAB — BASIC METABOLIC PANEL
BUN: 6 mg/dL (ref 6–23)
Creatinine, Ser: 0.78 mg/dL (ref 0.50–1.10)
GFR calc Af Amer: >90 mL/min (ref >90)
GFR calc non Af Amer: >90 mL/min (ref >90)
Glucose, Bld: 99 mg/dL (ref 70–99)

## 2013-05-22 MED ORDER — METOCLOPRAMIDE HCL 10 MG PO TABS
10.0000 mg | ORAL_TABLET | Freq: Three times a day (TID) | ORAL | Status: DC
  Administered 2013-05-22 – 2013-05-24 (×6): 10 mg via ORAL
  Filled 2013-05-22 (×8): qty 1

## 2013-05-22 MED ORDER — PANTOPRAZOLE SODIUM 40 MG PO TBEC
40.0000 mg | DELAYED_RELEASE_TABLET | Freq: Every day | ORAL | Status: DC
  Administered 2013-05-23 – 2013-05-24 (×2): 40 mg via ORAL
  Filled 2013-05-22 (×2): qty 1

## 2013-05-22 MED ORDER — CARVEDILOL 3.125 MG PO TABS
3.1250 mg | ORAL_TABLET | Freq: Two times a day (BID) | ORAL | Status: DC
  Administered 2013-05-22 – 2013-05-24 (×3): 3.125 mg via ORAL
  Filled 2013-05-22 (×6): qty 1

## 2013-05-22 NOTE — Progress Notes (Signed)
Subjective:  Some abdominal pain this am, she did eat some of her breakfast.  Objective:  Vital Signs in the last 24 hours: Temp:  [97.7 F (36.5 C)-98.4 F (36.9 C)] 98.2 F (36.8 C) (08/30 0533) Pulse Rate:  [66-86] 71 (08/30 0533) Resp:  [16-17] 16 (08/30 0533) BP: (101-121)/(59-76) 101/59 mmHg (08/30 0533) SpO2:  [99 %-100 %] 100 % (08/30 0533) Weight:  [116 lb (52.617 kg)] 116 lb (52.617 kg) (08/30 0533)  Intake/Output from previous day:  Intake/Output Summary (Last 24 hours) at 05/22/13 0907 Last data filed at 05/21/13 2130  Gross per 24 hour  Intake    503 ml  Output      0 ml  Net    503 ml    Physical Exam: General appearance: alert, cooperative, no distress and thin Lungs: clear to auscultation bilaterally Heart: regular rate and rhythm   Rate: 90  Rhythm: normal sinus rhythm and sinus tachycardia  Lab Results:  Recent Labs  05/22/13 0025 05/22/13 0540  WBC 10.9* 7.6  HGB 10.8* 10.2*  PLT 328 301    Recent Labs  05/20/13 0400 05/21/13 0500  NA 138 137  K 4.1 4.8  CL 109 106  CO2 21 24  GLUCOSE 86 88  BUN 4* 6  CREATININE 0.55 0.66   No results found for this basename: TROPONINI, CK, MB,  in the last 72 hours No results found for this basename: INR,  in the last 72 hours  Imaging: Imaging results have been reviewed  Cardiac Studies:  Assessment/Plan:   Principal Problem:   Syncope Active Problems:   Hypotension, unspecified   Radiation colitis   Gross hematuria   NICM EF of 20% by echo 01/2013-improved 35-40% 05/07/13   Anemia- transfused this adm   Cystitis, acute hemorrhagic- cysto 05/19/13   Cervical cancer   Hypokalemia on adm- replaced    PLAN: Will discuss with MD. Pt declines an ICD at this time. She is on no cardiac meds. Stop K+ supplement (K+ 4.8 yesterday). Resume Coreg at low dose. At some point resume ACE as B/P tolerates.   Corine Shelter PA-C Beeper [phone] 05/22/2013, 9:07 AM   I have seen and examined the  patient along with Corine Shelter PA-C.  I have reviewed the chart, notes and new data.  I agree with PA's note.  PLAN: Slowly reintroduce CHF meds, starting with low dose carvedilol (not to be titrated up again for another 2 weeks),followed by low dose ACEi. Plan to keep on telemetry through this period (throughout the long weekend)  Thurmon Fair, MD, [facility] [phone] 05/22/2013, 9:22 AM

## 2013-05-22 NOTE — Progress Notes (Addendum)
[facility] PROGRESS NOTE  Vanessa [name]:[id number] DOB: 04/10/1976 DOA: 05/17/2013 PCP: Dorrene German, MD  Assessment/Plan: Principal Problem:   Syncope Active Problems:   Cervical cancer   Radiation colitis   Hypokalemia on adm- replaced   Gross hematuria   NICM EF of 20% by echo 01/2013-improved 35-40% 05/07/13   Hypotension, unspecified   Anemia- transfused this adm   Cystitis, acute hemorrhagic- cysto 05/19/13    1. Syncope: Patient had a syncopal episode on day of presentation, raising the specter of vasovagal episode secondary to ABLA/orthostasis, vs arrhythmia. She had no recurrence during hospitalization. Telemetric monitoring failed to reveal any culprit arrhythmias. See further discussion in # 5 below. .  2. Gross hematuria with clots/Anemia: Patient developed gross hematuria with blood clots on 05/17/13. HB on admission was 10.2. Serial CBCs were followed. Dr Mena Goes provided urology consultation, and subsequently patient underwent cystoscopy, evacuation of bladder clots and bilateral retrograde ureterogram by Dr. Alfredo Martinez on 05/19/13. Patient appears to have a hemorrhagic cystitis. As HB was 7.6 on 05/20/13, she was transfused a total of 2 units PRBC, with bump in HB to 10.0 on 05/21/13. As of that date, hematuria had resolved. No recurrence.   3. H/O stage IB-2 squamous cell CA cancer, status post brachytherapy: Currently stable. For outpatient surveillance with oncology/Radiation oncology. 4. H/O DVT: Patient is s/p anticoagulation for about 8 weeks and then this was discontinued. 5. Nonischemic cardiomyopathy: Patient had known low EF of 20% originally, which improved to 30% on most recent MRI. Apparently, cardiac MRI last visit showed no structural anomaly. Nanetta Batty provided cardiology consultation, and patient was subsequently seen by Dr Rennis Golden who felt that she may be a good candidate for a Linq monitor, or if ventricular arrhythmias are documented,  an ICD may be indicated. Dr Hillis Range provided EP consultation, and has recommended ICD implantation, which patient has declined, despite detailed discussion with Dr Johney Frame. Per Dr Royann Shivers, will monitor telemetrically, through this weekend.  6. Hypertension: Patient has a known history of HTN, but SBP borderline-low in the initial days of hospitalization, and pre-admission antihypertensives were therefore, held. As of 05/20/13, BP had normalized. Coreg has been re-introduced by the cardiology team this AM at 3.125 mg BID. No up-titration for another 2 weeks. ACE-i is likely to be re-started next.  7. Hypokalemia: Repleted as indicated.  8. Complex surgical history/Ostomy: Patient is s/p left/sigmoid colectomy with anastomotic leak- ileostomy 04/16/13 by Dr. [name], entero-colonic fistula/colostomy prolapse, with revision and has a percutaneous abscess drain. Appears clinically stable, WOC assisted with ostomy management, and patient was seen by Dr Axel Filler, surgeon, on 8/28/154. She is recommended to keep follow up appointment scheduled with Dr [name] on 06/01/13.  9. Acute Gastritis: Patient developed stomach cramps and vomiting after eating [place] soup, brought by family, for lunch on 05/21/13. No diarrhea. Managing with antiemetics, Reglan, PPI and analgesics. Patient feels better today, and has had no further vomiting.  10. Protein-Calorie malnutrition: Patient meets criteria for severe malnutrition, in the context of chronic illness as evidenced by < 75% intake of estimated energy requirement for > 1 month and 13% weight loss x 1 month. Nutritionist consulted. Managing as recommended.      Code Status: Full Code.  Family Communication:  Disposition Plan: To be determined. Disposition. Per cardiology.    Brief narrative: Unfrotunate 37 y/o female, known Cervical Ca, s/p Radiation therapy 2013, Radiation  colitis, HTN, Eclampsia, Colostomy/revision and Cholecystectomy, recent admission for  reversal, complicated by large  intra-abd fluid/Gas/Abd abcess with possible SB dehiscence. Hospital course complicated by Hypotension, But repeat 2D Echocardiogram 05/06/13, significantly better (went from 20%-40-45) found to be improved and discharged home.  Patient presented to the hospital 05/17/2013 following a syncopal episode and was noted to have dark urine as well as gross hematuria with clots. Admitted for further investigation and management.    Consultants:  Dr Nanetta Batty, cardiologist.  Dr Jerilee Field, urologist.  Dr Lorin Picket McDarmid, urologist.   Axel Filler, surgeon.   Procedures:  See notes.   Antibiotics:  N/A.   HPI/Subjective: No vomiting today, has mild nausea.   Objective: Vital signs in last 24 hours: Temp:  [97.7 F (36.5 C)-98.4 F (36.9 C)] 98.2 F (36.8 C) (08/30 0533) Pulse Rate:  [66-86] 71 (08/30 0533) Resp:  [16-17] 16 (08/30 0533) BP: (101-121)/(59-76) 101/59 mmHg (08/30 0533) SpO2:  [99 %-100 %] 100 % (08/30 0533) Weight:  [52.617 kg (116 lb)] 52.617 kg (116 lb) (08/30 0533) Weight change: -0.408 kg (-14.4 oz) Last BM Date: 05/21/13  Intake/Output from previous day: 08/29 0701 - 08/30 0700 In: 743 [P.O.:740; I.V.:3] Out: -  Total I/O In: 243 [P.O.:240; I.V.:3] Out: -    Physical Exam: General: Comfortable, alert, communicative, fully oriented, not short of breath at rest.  HEENT:  Mild clinical pallor, no jaundice, no conjunctival injection or discharge. NECK:  Supple, JVP not seen, no carotid bruits, no palpable lymphadenopathy, no palpable goiter. CHEST:  Clinically clear to auscultation, no wheezes, no crackles. HEART:  Sounds 1 and 2 heard, normal, regular, no murmurs. ABDOMEN:  Full, soft, non-tender, no palpable organomegaly, no palpable masses, normal bowel sounds. Has functioning colostomy bag. Percutaneous drain is in RLQ.  GENITALIA:  Not examined. LOWER EXTREMITIES:  No pitting edema, palpable peripheral  pulses. MUSCULOSKELETAL SYSTEM:  Unremarkable. CENTRAL NERVOUS SYSTEM:  No focal neurologic deficit on gross examination.  Lab Results:  Recent Labs  05/22/13 0540 05/22/13 1130  WBC 7.6 7.1  HGB 10.2* 10.1*  HCT 30.5* 30.2*  PLT 301 295    Recent Labs  05/21/13 0500 05/22/13 0540  NA 137 137  K 4.8 4.3  CL 106 100  CO2 24 29  GLUCOSE 88 99  BUN 6 6  CREATININE 0.66 0.78  CALCIUM 10.6* 10.8*   Recent Results (from the past 240 hour(s))  URINE CULTURE     Status: None   Collection Time    05/17/13  8:30 PM      Result Value Range Status   Specimen Description URINE, CLEAN CATCH   Final   Special Requests NONE   Final   Culture  Setup Time     Final   Value: 05/18/2013 07:26     Performed at [facility] Count     Final   Value: NO GROWTH     Performed at [facility]   Culture     Final   Value: NO GROWTH     Performed at [facility]   Report Status 05/19/2013 FINAL   Final     Studies/Results: No results found.  Medications: Scheduled Meds: . carvedilol  3.125 mg Oral BID WC  . multivitamin with minerals  1 tablet Oral Daily  . ondansetron  4 mg Oral Q6H  . sodium chloride  3 mL Intravenous Q12H   Continuous Infusions:  PRN Meds:.acetaminophen, acetaminophen, HYDROmorphone (DILAUDID) injection, ondansetron (ZOFRAN) IV, ondansetron, oxyCODONE, oxyCODONE-acetaminophen, promethazine    LOS: 5 days   Lolamae Voisin,CHRISTOPHER  [facility] Pager [phone]. If 8PM-8AM, please contact night-coverage at [email or web address], password [facility] 05/22/2013, 12:48 PM  LOS: 5 days

## 2013-05-23 DIAGNOSIS — I959 Hypotension, unspecified: Secondary | ICD-10-CM

## 2013-05-23 LAB — CBC
HCT: 28.5 % — ABNORMAL LOW (ref 36.0–46.0)
HCT: 31.1 % — ABNORMAL LOW (ref 36.0–46.0)
HCT: 32.1 % — ABNORMAL LOW (ref 36.0–46.0)
Hemoglobin: 10.4 g/dL — ABNORMAL LOW (ref 12.0–15.0)
Hemoglobin: 10.5 g/dL — ABNORMAL LOW (ref 12.0–15.0)
Hemoglobin: 9.6 g/dL — ABNORMAL LOW (ref 12.0–15.0)
MCH: 28.2 pg (ref 26.0–34.0)
MCHC: 33.4 g/dL (ref 30.0–36.0)
MCHC: 33.7 g/dL (ref 30.0–36.0)
MCV: 84.8 fL (ref 78.0–100.0)
MCV: 86.3 fL (ref 78.0–100.0)
Platelets: 256 10*3/uL (ref 150–400)
RBC: 3.7 MIL/uL — ABNORMAL LOW (ref 3.87–5.11)
RBC: 3.72 MIL/uL — ABNORMAL LOW (ref 3.87–5.11)
RDW: 18.5 % — ABNORMAL HIGH (ref 11.5–15.5)
WBC: 5.4 10*3/uL (ref 4.0–10.5)
WBC: 7 10*3/uL (ref 4.0–10.5)

## 2013-05-23 LAB — BASIC METABOLIC PANEL
BUN: 7 mg/dL (ref 6–23)
Chloride: 97 mEq/L (ref 96–112)
GFR calc Af Amer: >90 mL/min (ref >90)
GFR calc non Af Amer: 82 mL/min — ABNORMAL LOW (ref >90)
Potassium: 3.8 mEq/L (ref 3.5–5.1)
Sodium: 134 mEq/L — ABNORMAL LOW (ref 135–145)

## 2013-05-23 NOTE — Progress Notes (Signed)
Subjective:  No SOB  Objective:  Vital Signs in the last 24 hours: Temp:  [98.2 F (36.8 C)-98.8 F (37.1 C)] 98.6 F (37 C) (08/31 0500) Pulse Rate:  [76-86] 86 (08/31 0500) Resp:  [17-18] 18 (08/31 0500) BP: (99-103)/(65-72) 101/72 mmHg (08/31 0500) SpO2:  [99 %-100 %] 99 % (08/31 0500) Weight:  [112 lb 8 oz (51.03 kg)] 112 lb 8 oz (51.03 kg) (08/31 0500)  Intake/Output from previous day:  Intake/Output Summary (Last 24 hours) at 05/23/13 0826 Last data filed at 05/23/13 0500  Gross per 24 hour  Intake    863 ml  Output    600 ml  Net    263 ml    Physical Exam: General appearance: alert, cooperative and no distress Lungs: clear to auscultation bilaterally Heart: regular rate and rhythm   Rate: 66-116  Rhythm: normal sinus rhythm  Lab Results:  Recent Labs  05/22/13 2311 05/23/13 0550  WBC 4.9 5.4  HGB 10.0* 10.4*  PLT 280 286    Recent Labs  05/22/13 0540 05/23/13 0550  NA 137 134*  K 4.3 3.8  CL 100 97  CO2 29 28  GLUCOSE 99 100*  BUN 6 7  CREATININE 0.78 0.89    Imaging: Imaging results have been reviewed  Cardiac Studies:  Assessment/Plan:   Principal Problem:   Syncope- seen by EP, pt declined ICD  Active Problems:    Hypotension, unspecified   Radiation colitis   Gross hematuria   NICM EF of 20% by echo 01/2013-improved 35-40% 05/07/13   Anemia- transfused this adm   Cystitis, acute hemorrhagic- cysto 05/19/13   Cervical cancer   Hypokalemia on adm- replaced   PLAN: Coreg resumed, consider resuming low dose ACE in AM.  Corine Shelter PA-C Beeper [phone] 05/23/2013, 8:26 AM  I have seen and examined the patient along with Corine Shelter PA-C.  I have reviewed the chart, notes and new data.  I agree with PA's note.  Key new complaints: denies dizziness or dyspnea Key examination changes: BP relatively low, but no presyncope/syncope Key new findings / data: stable Hgb and renal function.  PLAN: Proceed with plans to add low dose  ACEi, but would not titrate meds further for another couple of weeks. I think once TPN was stopped, her intake of fluids and sodium is substantially lower and her weight is much lower also. She will not be able to handle the same doses of CHF meds as in the past.  Thurmon Fair, MD, [facility] [phone] 05/23/2013, 8:45 AM

## 2013-05-23 NOTE — Progress Notes (Signed)
[facility] PROGRESS NOTE  Vanessa [name] ZOX:[id number] DOB: 26-Dec-1975 DOA: 05/17/2013 PCP: Dorrene German, MD  Assessment/Plan: Principal Problem:   Syncope Active Problems:   Cervical cancer   Radiation colitis   Hypokalemia on adm- replaced   Gross hematuria   NICM EF of 20% by echo 01/2013-improved 35-40% 05/07/13   Hypotension, unspecified   Anemia- transfused this adm   Cystitis, acute hemorrhagic- cysto 05/19/13    1. Syncope: Patient had a syncopal episode on day of presentation, raising the specter of vasovagal episode secondary to ABLA/orthostasis, vs arrhythmia. She had no recurrence during hospitalization. Telemetric monitoring failed to reveal any culprit arrhythmias. See further discussion in # 5 below.   2. Gross hematuria with clots/Anemia: Patient developed gross hematuria with blood clots on 05/17/13. HB on admission was 10.2. Serial CBCs were followed. Dr Mena Goes provided urology consultation, and subsequently patient underwent cystoscopy, evacuation of bladder clots and bilateral retrograde ureterogram by Dr. Alfredo Martinez on 05/19/13. Patient appears to have a hemorrhagic cystitis. As HB was 7.6 on 05/20/13, she was transfused a total of 2 units PRBC, with bump in HB to 10.0 on 05/21/13. As of that date, hematuria had resolved. No recurrence.   3. H/O stage IB-2 squamous cell CA cancer, status post brachytherapy: Currently stable. For outpatient surveillance with oncology/Radiation oncology. 4. H/O DVT: Patient is s/p anticoagulation for about 8 weeks and then this was discontinued. 5. Nonischemic cardiomyopathy: Patient had known low EF of 20% originally, which improved to 30% on most recent MRI. Apparently, cardiac MRI last visit showed no structural anomaly. Nanetta Batty provided cardiology consultation, and patient was subsequently seen by Dr Rennis Golden who felt that she may be a good candidate for a Linq monitor, or if ventricular arrhythmias are documented, an  ICD may be indicated. Dr Hillis Range provided EP consultation, and has recommended ICD implantation, which patient has declined, despite detailed discussion with Dr Johney Frame. Per Dr Royann Shivers, will monitor telemetrically, through this weekend. No clinical CHF decompensation at this time.  6. Hypertension: Patient has a known history of HTN, but SBP borderline-low in the initial days of hospitalization, and pre-admission antihypertensives were therefore, held. As of 05/20/13, BP had normalized. Coreg has been re-introduced by the cardiology team this AM at 3.125 mg BID. No up-titration for another 2 weeks. ACE-i is likely to be re-started on 05/24/13.  7. Hypokalemia: Repleted as indicated.  8. Complex surgical history/Ostomy: Patient is s/p left/sigmoid colectomy with anastomotic leak- ileostomy 04/16/13 by Dr. [name], entero-colonic fistula/colostomy prolapse, with revision and has a percutaneous abscess drain. Appears clinically stable, WOC assisted with ostomy management, and patient was seen by Dr Axel Filler, surgeon, on 8/28/154. She is recommended to keep follow up appointment scheduled with Dr [name] on 06/01/13.  9. Acute Gastritis: Patient developed stomach cramps and vomiting after eating [place] soup, brought by family, for lunch on 05/21/13. No diarrhea. Managing with antiemetics, Reglan, PPI and analgesics. Improved/resolved.  10. Protein-Calorie malnutrition: Patient meets criteria for severe malnutrition, in the context of chronic illness as evidenced by < 75% intake of estimated energy requirement for > 1 month and 13% weight loss x 1 month. Nutritionist consulted. Managing as recommended.      Code Status: Full Code.  Family Communication:  Disposition Plan: To be determined. Disposition. Per cardiology.    Brief narrative: Unfrotunate 37 y/o female, known Cervical Ca, s/p Radiation therapy 2013, Radiation  colitis, HTN, Eclampsia, Colostomy/revision and Cholecystectomy, recent admission  for reversal, complicated by large intra-abd  fluid/Gas/Abd abcess with possible SB dehiscence. Hospital course complicated by Hypotension, But repeat 2D Echocardiogram 05/06/13, significantly better (went from 20%-40-45) found to be improved and discharged home.  Patient presented to the hospital 05/17/2013 following a syncopal episode and was noted to have dark urine as well as gross hematuria with clots. Admitted for further investigation and management.    Consultants:  Dr Nanetta Batty, cardiologist.  Dr Jerilee Field, urologist.  Dr Lorin Picket McDarmid, urologist.   Axel Filler, surgeon.   Procedures:  See notes.   Antibiotics:  N/A.   HPI/Subjective: No new issues. Very keen to go home.   Objective: Vital signs in last 24 hours: Temp:  [98.2 F (36.8 C)-98.8 F (37.1 C)] 98.6 F (37 C) (08/31 0500) Pulse Rate:  [76-87] 87 (08/31 0921) Resp:  [17-18] 18 (08/31 0500) BP: (98-103)/(65-72) 98/70 mmHg (08/31 0921) SpO2:  [99 %-100 %] 99 % (08/31 0500) Weight:  [51.03 kg (112 lb 8 oz)] 51.03 kg (112 lb 8 oz) (08/31 0500) Weight change: -1.588 kg (-3 lb 8 oz) Last BM Date: 05/23/13  Intake/Output from previous day: 08/30 0701 - 08/31 0700 In: 863 [P.O.:860; I.V.:3] Out: 600 [Urine:600] Total I/O In: 480 [P.O.:480] Out: -    Physical Exam: General: Comfortable, alert, communicative, fully oriented, not short of breath at rest.  HEENT:  Mild clinical pallor, no jaundice, no conjunctival injection or discharge. NECK:  Supple, JVP not seen, no carotid bruits, no palpable lymphadenopathy, no palpable goiter. CHEST:  Clinically clear to auscultation, no wheezes, no crackles. HEART:  Sounds 1 and 2 heard, normal, regular, no murmurs. ABDOMEN:  Full, soft, non-tender, no palpable organomegaly, no palpable masses, normal bowel sounds. Has functioning colostomy bag. Percutaneous drain is in RLQ.  GENITALIA:  Not examined. LOWER EXTREMITIES:  No pitting edema, palpable  peripheral pulses. MUSCULOSKELETAL SYSTEM:  Unremarkable. CENTRAL NERVOUS SYSTEM:  No focal neurologic deficit on gross examination.  Lab Results:  Recent Labs  05/23/13 0550 05/23/13 1159  WBC 5.4 7.0  HGB 10.4* 10.5*  HCT 31.1* 32.1*  PLT 286 256    Recent Labs  05/22/13 0540 05/23/13 0550  NA 137 134*  K 4.3 3.8  CL 100 97  CO2 29 28  GLUCOSE 99 100*  BUN 6 7  CREATININE 0.78 0.89  CALCIUM 10.8* 10.4   Recent Results (from the past 240 hour(s))  URINE CULTURE     Status: None   Collection Time    05/17/13  8:30 PM      Result Value Range Status   Specimen Description URINE, CLEAN CATCH   Final   Special Requests NONE   Final   Culture  Setup Time     Final   Value: 05/18/2013 07:26     Performed at [facility] Count     Final   Value: NO GROWTH     Performed at [facility]   Culture     Final   Value: NO GROWTH     Performed at [facility]   Report Status 05/19/2013 FINAL   Final     Studies/Results: No results found.  Medications: Scheduled Meds: . carvedilol  3.125 mg Oral BID WC  . metoCLOPramide  10 mg Oral TID AC  . multivitamin with minerals  1 tablet Oral Daily  . ondansetron  4 mg Oral Q6H  . pantoprazole  40 mg Oral Q0600  . sodium chloride  3 mL Intravenous Q12H   Continuous Infusions:  PRN Meds:.acetaminophen, acetaminophen, HYDROmorphone (DILAUDID) injection, ondansetron (ZOFRAN) IV, ondansetron, oxyCODONE, oxyCODONE-acetaminophen, promethazine    LOS: 6 days   Hang Ammon,CHRISTOPHER  [facility] Pager [phone]. If 8PM-8AM, please contact night-coverage at [email or web address], password [facility] 05/23/2013, 1:16 PM  LOS: 6 days

## 2013-05-24 DIAGNOSIS — I2789 Other specified pulmonary heart diseases: Secondary | ICD-10-CM

## 2013-05-24 LAB — CBC
HCT: 29.1 % — ABNORMAL LOW (ref 36.0–46.0)
Hemoglobin: 9.4 g/dL — ABNORMAL LOW (ref 12.0–15.0)
MCH: 27.8 pg (ref 26.0–34.0)
MCH: 28.4 pg (ref 26.0–34.0)
MCHC: 32.3 g/dL (ref 30.0–36.0)
MCV: 85 fL (ref 78.0–100.0)
Platelets: 279 10*3/uL (ref 150–400)
RDW: 18.6 % — ABNORMAL HIGH (ref 11.5–15.5)
RDW: 18.7 % — ABNORMAL HIGH (ref 11.5–15.5)
WBC: 8 10*3/uL (ref 4.0–10.5)

## 2013-05-24 LAB — BASIC METABOLIC PANEL
BUN: 12 mg/dL (ref 6–23)
Creatinine, Ser: 0.96 mg/dL (ref 0.50–1.10)
GFR calc Af Amer: 87 mL/min — ABNORMAL LOW (ref >90)
GFR calc non Af Amer: 75 mL/min — ABNORMAL LOW (ref >90)
Glucose, Bld: 96 mg/dL (ref 70–99)
Potassium: 4 mEq/L (ref 3.5–5.1)

## 2013-05-24 MED ORDER — LISINOPRIL 5 MG PO TABS
5.0000 mg | ORAL_TABLET | Freq: Every day | ORAL | Status: DC
  Filled 2013-05-24: qty 1

## 2013-05-24 MED ORDER — CARVEDILOL 3.125 MG PO TABS: 3.1250 mg | ORAL_TABLET | Freq: Two times a day (BID) | ORAL | Status: AC

## 2013-05-24 MED ORDER — LISINOPRIL 5 MG PO TABS: 5.0000 mg | ORAL_TABLET | Freq: Every day | ORAL | Status: DC

## 2013-05-24 NOTE — Progress Notes (Signed)
Subjective:  No complaints, wants to go home.  Objective:  Vital Signs in the last 24 hours: Temp:  [98.1 F (36.7 C)-98.8 F (37.1 C)] 98.8 F (37.1 C) (09/01 0500) Pulse Rate:  [76-87] 76 (09/01 0500) Resp:  [16-20] 16 (09/01 0500) BP: (95-116)/(62-81) 116/81 mmHg (09/01 0500) SpO2:  [100 %] 100 % (09/01 0500)  Intake/Output from previous day:  Intake/Output Summary (Last 24 hours) at 05/24/13 0820 Last data filed at 05/24/13 0816  Gross per 24 hour  Intake    960 ml  Output    500 ml  Net    460 ml    Physical Exam: General appearance: alert, cooperative and no distress Lungs: clear to auscultation bilaterally Heart: regular rate and rhythm   Rate: 76  Rhythm: normal sinus rhythm  Lab Results:  Recent Labs  05/23/13 2340 05/24/13 0500  WBC 7.5 7.5  HGB 9.6* 9.4*  PLT 274 269    Recent Labs  05/23/13 0550 05/24/13 0500  NA 134* 136  K 3.8 4.0  CL 97 102  CO2 28 26  GLUCOSE 100* 96  BUN 7 12  CREATININE 0.89 0.96   No results found for this basename: TROPONINI, CK, MB,  in the last 72 hours No results found for this basename: INR,  in the last 72 hours  Imaging: Imaging results have been reviewed  Cardiac Studies:  Assessment/Plan:   Principal Problem:   Syncope Active Problems:   Hypotension, unspecified   Radiation colitis   Gross hematuria   NICM EF of 20% by echo 01/2013-improved 35-40% 05/07/13   Anemia- transfused this adm   Cystitis, acute hemorrhagic- cysto 05/19/13   Cervical cancer   Hypokalemia on adm- replaced    PLAN: Tolerating Coreg. Resume ACE at low dose (lisinopril 5 mg Q HS). She has follow up arranged for next week. She should be OK for discharge- Dr Royann Shivers to see this am.  Corine Shelter [facility] [phone] 05/24/2013, 8:20 AM   I have seen and examined the patient along with Corine Shelter PA-C.  I have reviewed the chart, notes and new data.  I agree with PA's note.  CHF meds need to be slowly titrated up as an  outpatient - probably no more often than every 10-14 days.  Thurmon Fair, MD, [facility] [phone] 05/24/2013, 9:21 AM

## 2013-05-24 NOTE — Discharge Summary (Signed)
Physician Discharge Summary  Vanessa [name] UJW:[id number] DOB: 04/02/1976 DOA: 05/17/2013  PCP: Dorrene German, MD  Admit date: 05/17/2013 Discharge date: 05/24/2013  Time spent: 40 minutes  Recommendations for Outpatient Follow-up:  1. Follow up with Dr Thurmon Fair, cardiologist. 2. Follow up with Dr Jerilee Field, urologist. 3. Follow up with Dr [name], surgeon.   Discharge Diagnoses:  Principal Problem:   Syncope Active Problems:   Cervical cancer   Radiation colitis   Hypokalemia on adm- replaced   Gross hematuria   NICM EF of 20% by echo 01/2013-improved 35-40% 05/07/13   Hypotension, unspecified   Anemia- transfused this adm   Cystitis, acute hemorrhagic- cysto 05/19/13   Discharge Condition: Satisfactory.   Diet recommendation: Heart-Healthy.   Filed Weights   05/21/13 0611 05/22/13 0533 05/23/13 0500  Weight: 53.025 kg (116 lb 14.4 oz) 52.617 kg (116 lb) 51.03 kg (112 lb 8 oz)    History of present illness:  37 y/o female, known Cervical Ca, s/p Radiation therapy 2013, Radiation colitis, HTN, Eclampsia, Colostomy/revision and Cholecystectomy, recent admission for reversal, complicated by large intra-abd fluid/Gas/Abd abcess with possible SB dehiscence. Hospital course complicated by Hypotension, But repeat 2D Echocardiogram 05/06/13, significantly better (went from 20%-40-45) found to be improved and discharged home. Patient presented to the hospital 05/17/2013 following a syncopal episode and was noted to have dark urine as well as gross hematuria with clots. Admitted for further investigation and management.    Hospital Course:  1. Syncope: Patient had a syncopal episode on day of presentation, raising the specter of vasovagal episode secondary to ABLA/orthostasis, vs arrhythmia. She had no recurrence during hospitalization. Telemetric monitoring failed to reveal any culprit arrhythmias. See further discussion in # 5 below.  2. Gross hematuria with clots/Anemia:  Patient developed gross hematuria with blood clots on 05/17/13. HB on admission was 10.2. Serial CBCs were followed. Dr Mena Goes provided urology consultation, and subsequently patient underwent cystoscopy, evacuation of bladder clots and bilateral retrograde ureterogram by Dr. Alfredo Martinez on 05/19/13. Patient appears to have a hemorrhagic cystitis. As HB was 7.6 on 05/20/13, she was transfused a total of 2 units PRBC, with bump in HB to 10.0 on 05/21/13. As of that date, hematuria had resolved. No recurrence, and HB remained stable. 3. H/O stage IB-2 squamous cell CA cancer, status post brachytherapy: Currently stable. For outpatient surveillance with oncology/Radiation oncology.  4. H/O DVT: Patient is s/p anticoagulation for about 8 weeks and then this was discontinued.  5. Nonischemic cardiomyopathy: Patient had known low EF of 20% originally, which improved to 30% on most recent MRI. Apparently, cardiac MRI last visit showed no structural anomaly. Nanetta Batty provided cardiology consultation, and patient was subsequently seen by Dr Rennis Golden who felt that she may be a good candidate for a Linq monitor, or if ventricular arrhythmias are documented, an ICD may be indicated. Dr Hillis Range provided EP consultation, and recommended ICD implantation, which patient has declined, despite detailed discussion with Dr Johney Frame. No clinical CHF decompensation at this time. Patient will follow up with Dr Royann Shivers, on discharge.  6. Hypertension: Patient has a known history of HTN, but SBP borderline-low in the initial days of hospitalization, and pre-admission antihypertensives were therefore, held. As of 05/20/13, BP had normalized. Coreg was re-introduced by the cardiology team on 05/23/13 at 3.125 mg BID, and tolerated. Lisinopril was started at 5 mg QHS, on 05/24/13. Per cardiology, No up-titration for another 10 days-2 weeks.  7. Hypokalemia: Repleted as indicated.  8. Complex surgical  history/Ostomy: Patient is s/p  left/sigmoid colectomy with anastomotic leak- ileostomy 04/16/13 by Dr. [name], entero-colonic fistula/colostomy prolapse, with revision and has a percutaneous abscess drain. Appears clinically stable, WOC assisted with ostomy management, and patient was seen by Dr Axel Filler, surgeon, on 8/28/154. She is recommended to keep follow up appointment scheduled with Dr [name] on 06/01/13.  9. Acute Gastritis: Patient developed stomach cramps and vomiting after eating [place] soup, brought by family, for lunch on 05/21/13. No diarrhea. Managed with antiemetics, Reglan, PPI and analgesics. Improved/resolved.  10. Protein-Calorie malnutrition: Patient meets criteria for severe malnutrition, in the context of chronic illness as evidenced by < 75% intake of estimated energy requirement for > 1 month and 13% weight loss x 1 month. Nutritionist consulted. Managed as recommended.    Procedures:  See notes.   Consultations: Dr Nanetta Batty, cardiologist.  Dr Jerilee Field, urologist.  Dr Lorin Picket McDarmid, urologist.  Axel Filler, surgeon.  Dr Hillis Range, EP.   Discharge Exam: Filed Vitals:   05/24/13 0500  BP: 116/81  Pulse: 76  Temp: 98.8 F (37.1 C)  Resp: 16    General: Comfortable, alert, communicative, fully oriented, not short of breath at rest.  HEENT: Mild clinical pallor, no jaundice, no conjunctival injection or discharge.  NECK: Supple, JVP not seen, no carotid bruits, no palpable lymphadenopathy, no palpable goiter.  CHEST: Clinically clear to auscultation, no wheezes, no crackles.  HEART: Sounds 1 and 2 heard, normal, regular, no murmurs.  ABDOMEN: Full, soft, non-tender, no palpable organomegaly, no palpable masses, normal bowel sounds. Has functioning colostomy bag. Percutaneous drain is in RLQ.  GENITALIA: Not examined.  LOWER EXTREMITIES: No pitting edema, palpable peripheral pulses.  MUSCULOSKELETAL SYSTEM: Unremarkable.  CENTRAL NERVOUS SYSTEM: No focal neurologic  deficit on gross examination.  Discharge Instructions      Discharge Orders   Future Appointments Provider Department Dept Phone   05/26/2013 8:00 AM [facility] Rm 1 [place] [facility] [address] [phone]   06/01/2013 11:50 AM Cherylynn Ridges, MD [facility], [place] [phone]   06/11/2013 8:45 AM Thurmon Fair, MD [facility] [place] [phone]   Future Orders Complete By Expires   Diet - low sodium heart healthy  As directed    Increase activity slowly  As directed        Medication List         carvedilol 3.125 MG tablet  Commonly known as:  COREG  Take 1 tablet (3.125 mg total) by mouth 2 (two) times daily with a meal.     lisinopril 5 MG tablet  Commonly known as:  PRINIVIL,ZESTRIL  Take 1 tablet (5 mg total) by mouth at bedtime.     multivitamin with minerals Tabs tablet  Take 1 tablet by mouth daily.     ondansetron 4 MG tablet  Commonly known as:  ZOFRAN  Take 1 tablet (4 mg total) by mouth every 6 (six) hours.     oxyCODONE-acetaminophen 10-325 MG per tablet  Commonly known as:  PERCOCET  Take 1 tablet by mouth every 4 (four) hours as needed for pain.       Allergies  Allergen Reactions  . Penicillins Other (See Comments)    "anything w/penicillin in it makes me bleed"  . Labetalol Hcl Itching and Other (See Comments)    Bumps to bilateral arms.  . Morphine And Related Hives   Follow-up Information   Schedule an appointment as soon as possible for a visit with  Dorrene German, MD.   Specialty:  Internal Medicine   Contact information:   [address] [place] [address] [phone]       Call Thurmon Fair, MD.   Specialty:  Cardiology   Contact information:   [address] [place] [address] [phone]       Follow up with Cherylynn Ridges, MD. (Keep prior scheduled appointment. )    Specialty:  General Surgery   Contact information:   [address] [place],  [place] [address]  [place] [address] [phone]       Schedule an appointment as soon as possible for a visit with Mena Goes Lowella Petties, MD.   Specialty:  Urology   Contact information:   [address] [place] [address] [phone]        The results of significant diagnostics from this hospitalization (including imaging, microbiology, ancillary and laboratory) are listed below for reference.    Significant Diagnostic Studies: Ct Abdomen Pelvis Wo Contrast  05/04/2013   *RADIOLOGY REPORT*  Clinical Data: Abscess  CT ABDOMEN AND PELVIS WITHOUT CONTRAST  Technique:  Multidetector CT imaging of the abdomen and pelvis was performed following the standard protocol without intravenous contrast.  Comparison: 05/03/2013  Findings: Lung bases, liver, spleen, pancreas, adrenal glands are within normal limits.  Post cholecystectomy.  Stable appearance of the kidneys.  Tiny calculus in the lower pole of the left kidney.  The right sided abdominal abscess drain is stable in position within the anterior peritoneal space.  No residual abscess.  Small amount of gas adjacent to the emboli this is stable.  Left pelvic gas and fluid-filled abscess measuring 2.2 x 4.5 cm is not significantly changed.  There is no enteric contrast within this abscess cavity.  Bladder is distended.  Perirectal edema within the presacral fat is stable.  No disproportionate dilatation of small bowel to suggest obstruction.  Previously noted small bowel distention has subsided.  IMPRESSION: There is no evidence of enteric contrast within the left low pelvic abscess.  Small bowel distention has improved.   Original Report Authenticated By: Jolaine Click, M.D.   Ct Angio Chest Pe W/cm &/or Wo Cm  05/17/2013   *RADIOLOGY REPORT*  Clinical Data: Elevated D-dimer, abdominal pain  CT ANGIOGRAPHY CHEST,CT ABDOMEN AND PELVIS WITH CONTRAST  Technique:  Multidetector CT imaging of the chest using  the standard protocol during bolus administration of intravenous contrast. Multiplanar reconstructed images including MIPs were obtained and reviewed to evaluate the vascular anatomy.,Technique: Mul  Contrast: 80mL OMNIPAQUE IOHEXOL 350 MG/ML SOLN  Comparison: 05/04/2013 abdominal CT, 08/06/2010 PET CT  CHEST: Findings: Pulmonary arteries are patent.  Normal caliber aorta and branch vessels.  Cardiomegaly.  No pleural or pericardial effusion. Mildly prominent left axillary lymph node measuring 1 cm short axis on image 93 series 6. This has developed since the prior PET CT. Otherwise, no intrathoracic lymphadenopathy.  Central airways are patent.  No pneumothorax.  Linear opacity within the right lower lobe, favored to reflect atelectasis or scar.  Unchanged 3 mm nodule right lung base, may be vascular. No acute osseous finding.  IMPRESSION: No pulmonary embolism or acute intrathoracic process.  Nonspecific enlarged left axillary lymph node.  Consider tissue sampling.  Mild cardiomegaly/left ventricular enlargement.  Correlate with echocardiogram.  ABDOMEN/PELVIS Findings: Unremarkable liver, spleen, pancreas.  Cholecystectomy. No biliary ductal dilatation.  Unremarkable adrenal glands.  Symmetric renal enhancement.  Nonobstructing lower pole stone on the left.  Pancolitis and  left lower quadrant colostomy.  No bowel obstruction.  Surgical drain terminates within the midline anterior abdomen.  Complex peripherally enhancing collection within the pelvis tracks superiorly, inseparable from adjacent loops of bowel. The amount of extraluminal gas has decreased.  Surgical anastomotic suture right lower quadrant. Open midline laparotomy.  Normal caliber aorta and branch vessels.  Circumferential bladder wall thickening.  Intraluminal high attenuation presumably reflects clot.  Uterus is presumably absent with irregularly enhancing soft tissue within the pelvis, similar to prior.  No interval osseous change.  IMPRESSION:   Postoperative changes of left lower quadrant colostomy. Nonspecific pancolitis.  No small bowel obstruction.  Pelvic abscess persists though has decreased in size, with the largest component measuring 2.1 x 1.1 cm within the left anterior pelvis.  Circumferential bladder wall thickening.  Interval intraluminal high attenuation presumably reflects blood clot.  Correlate with urinalysis.  May reflect cystitis including radiation cystitis.  Uterus is presumably absent with irregularly enhancing soft tissue within the pelvis, similar to prior.   Original Report Authenticated By: Jearld Lesch, M.D.   Ct Abdomen Pelvis W Contrast  05/17/2013   *RADIOLOGY REPORT*  Clinical Data: Elevated D-dimer, abdominal pain  CT ANGIOGRAPHY CHEST,CT ABDOMEN AND PELVIS WITH CONTRAST  Technique:  Multidetector CT imaging of the chest using the standard protocol during bolus administration of intravenous contrast. Multiplanar reconstructed images including MIPs were obtained and reviewed to evaluate the vascular anatomy.,Technique: Mul  Contrast: 80mL OMNIPAQUE IOHEXOL 350 MG/ML SOLN  Comparison: 05/04/2013 abdominal CT, 08/06/2010 PET CT  CHEST: Findings: Pulmonary arteries are patent.  Normal caliber aorta and branch vessels.  Cardiomegaly.  No pleural or pericardial effusion. Mildly prominent left axillary lymph node measuring 1 cm short axis on image 93 series 6. This has developed since the prior PET CT. Otherwise, no intrathoracic lymphadenopathy.  Central airways are patent.  No pneumothorax.  Linear opacity within the right lower lobe, favored to reflect atelectasis or scar.  Unchanged 3 mm nodule right lung base, may be vascular. No acute osseous finding.  IMPRESSION: No pulmonary embolism or acute intrathoracic process.  Nonspecific enlarged left axillary lymph node.  Consider tissue sampling.  Mild cardiomegaly/left ventricular enlargement.  Correlate with echocardiogram.  ABDOMEN/PELVIS Findings: Unremarkable liver,  spleen, pancreas.  Cholecystectomy. No biliary ductal dilatation.  Unremarkable adrenal glands.  Symmetric renal enhancement.  Nonobstructing lower pole stone on the left.  Pancolitis and left lower quadrant colostomy.  No bowel obstruction.  Surgical drain terminates within the midline anterior abdomen.  Complex peripherally enhancing collection within the pelvis tracks superiorly, inseparable from adjacent loops of bowel. The amount of extraluminal gas has decreased.  Surgical anastomotic suture right lower quadrant. Open midline laparotomy.  Normal caliber aorta and branch vessels.  Circumferential bladder wall thickening.  Intraluminal high attenuation presumably reflects clot.  Uterus is presumably absent with irregularly enhancing soft tissue within the pelvis, similar to prior.  No interval osseous change.  IMPRESSION:  Postoperative changes of left lower quadrant colostomy. Nonspecific pancolitis.  No small bowel obstruction.  Pelvic abscess persists though has decreased in size, with the largest component measuring 2.1 x 1.1 cm within the left anterior pelvis.  Circumferential bladder wall thickening.  Interval intraluminal high attenuation presumably reflects blood clot.  Correlate with urinalysis.  May reflect cystitis including radiation cystitis.  Uterus is presumably absent with irregularly enhancing soft tissue within the pelvis, similar to prior.   Original Report Authenticated By: Jearld Lesch, M.D.   Ct Abdomen Pelvis W Contrast  05/03/2013   *  RADIOLOGY REPORT*  Clinical Data: Postop abdominal abscess, status post percutaneous drainage  CT ABDOMEN AND PELVIS WITH CONTRAST  Technique:  Multidetector CT imaging of the abdomen and pelvis was performed following the standard protocol during bolus administration of intravenous contrast.  Contrast: OMNIPAQUE IOHEXOL 300 MG/ML  SOLN  Comparison: 04/25/2013  Findings: Minor streaky bibasilar atelectasis.  Lung bases otherwise clear.  Normal  heart size.  No pericardial or pleural effusion.  Abdomen:  Prior cholecystectomy evident.  Liver, biliary system, pancreas, spleen, accessory splenule, adrenal glands, and kidneys are within normal limits for age and demonstrate no acute process.  Anterior percutaneous drain is in good position.  There is near complete resolution of the previous anterior abdominal abscess. Small amount of fluid and air noted around the catheter retention loop.  No new abdominal fluid collection appreciated.  Colostomy noted in the left abdomen.  Nonspecific diffuse fluid distention of the small bowel.  No definite obstruction pattern.  Previous right lower quadrant surgical drain has been removed.  Along the percutaneous tract of this removed drain there is a small amount of residual fluid and air within the soft tissues, image 58 and 59.  Pelvis:  Postop changes in the right lower quadrant where radiodense suture lines are noted.  Diffuse edema and soft tissue thickening noted throughout the peritoneal cavity with associated mesenteric edema extending into the pelvis.  Chronic bladder wall thickening.  Uterus is not visualized.  Stable soft tissue prominence of the presacral space compatible with prior radiation. No significant interval change in the pelvis.  No inguinal abnormality or hernia.  Within the left upper pelvis,  there is a residual small air fluid collection measuring 3.8 x 2.0 cm, image 67.  This correlates with a small interloop abscess close to where the previous pelvic surgical drain terminated.  Minor degenerative changes of the lumbar spine and facet joints.  IMPRESSION: Nearly resolved intra-abdominal abscess following drain insertion. Trace amount of air and fluid about the drain catheter site noted.  Stable small air fluid collection in the left hemi pelvis as before  Stable fluid distention of small bowel without definite obstruction.  Stable postoperative findings and chronic changes in the abdomen and  pelvis  No new abscess or fluid collection within the abdomen or pelvis.   Original Report Authenticated By: Judie Petit. Miles Costain, M.D.   Ct Abdomen Pelvis W Contrast  04/25/2013   *RADIOLOGY REPORT*  Clinical Data: Abdominal pain.  Recent colostomy revision and appendectomy on 04/16/2013.  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  Surgical history includes cholecystectomy.  CT ABDOMEN AND PELVIS WITH CONTRAST  Technique:  Multidetector CT imaging of the abdomen and pelvis was performed following the standard protocol during bolus administration of intravenous contrast.  Contrast: OMNIPAQUE IOHEXOL 300 MG/ML  SOLN Oral contrast also administered.  Comparison: CT abdomen and pelvis 02/10/2013, 01/28/2013, 12/03/2012.  Findings: Large abscess in the upper abdomen to the right of midline extending across the midline measuring approximately 7 x 11 x 8 cm.  Oral contrast material is present within the abscess, indicating an active bowel leak.  I believe the origin of the leak may be a small bowel loop in the upper pelvis, adjacent to the indwelling surgical drain.  There is evidence of a smaller abscess in the left side of the upper pelvis measuring approximately 3 cm diameter, adjacent to this bowel loop, but there is no oral contrast within this abscess. Other smaller abscesses are suspected elsewhere  in the abdomen and pelvis.  There is only minimal free fluid in the abdomen.  There is a small amount of free intraperitoneal air.  Gas is also seen tracking upward into the abdominal incision.  The stoma is unremarkable.  Anatomic variant in that the left lobe of the liver extends well across the midline of the left upper quadrant.  No focal hepatic parenchymal abnormality.  Gallbladder surgically absent.  No biliary ductal dilation.  Normal spleen, pancreas, adrenal glands, and right kidney.  Very small (2 mm) calculus in a lower pole calix of the otherwise normal-appearing left  kidney.  No visible aorto- iliofemoral atherosclerosis.  Widely patent visceral arteries. Scattered normal-sized retroperitoneal and mesenteric lymph nodes.  Mild thickening of the wall of the urinary bladder diffusely, likely secondarily inflamed from the abdominopelvic inflammatory changes.  Uterus not visualized presumed surgically absent.  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 Phleboliths low in the left side of the pelvis.  Bone window images unremarkable.  Atelectasis in the lung bases. Moderate cardiomegaly with left ventricular enlargement.  IMPRESSION:  1.  Large abscess in the upper abdomen with active oral contrast extravasation from what I believe is a loop of small bowel in the upper pelvis, consistent with perforation. 2.  Smaller abscess in the left side of the pelvis adjacent to this small bowel loop, though there is no contrast extravasation into this abscess.  Other smaller abscesses are suspected elsewhere in the abdomen and pelvis 3.  Mildly dilated small bowel loops in the upper abdomen, likely a reflex ileus. 4.  Small amount of free intraperitoneal air. 5.  Non-obstructing 2 mm left lower pole renal calculus. 6.  Mild atelectasis in the lower lobes.  Moderate cardiomegaly with left ventricular enlargement.  Critical Value/emergent results were called by telephone at the time of interpretation on 04/25/2013 at 1150 hours to Dr. Luisa Hart of [facility] Surgery, who verbally acknowledged these results.   Original Report Authenticated By: Hulan Saas, M.D.   Ct Image Guided Drainage Percut Cath  Peritoneal Retroperit  04/26/2013   *RADIOLOGY REPORT*  CT GUIDED ABSCESS DRAIN PLACEMENT  Date: 04/25/2013  Clinical History: 37 year old female with complicated medical and surgical history.  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  She is also experienced multiple post surgical set backs.  Most recent operation  was 04/16/2013 when she underwent small bowel resection and colonic resection to repair colocutaneous and enterocutaneous fistulae as well as revision of a prolapsed ostomy.  She now presents with a large intra-abdominal abscess and extravasation of oral contrast material concerning for small bowel anastomotic leak. She is currently not a surgical candidate.  A percutaneous drainage catheter is requested.  Procedures Performed: 1. CT guided abscess drain placement  Interventional Radiologist:  Sterling Big, MD  Sedation: Moderate (conscious) sedation was used.  Two mg Versed, 75 mcg Fentanyl were administered intravenously.  The patient's vital signs were monitored continuously by radiology nursing throughout the procedure.  Sedation Time: 15 minutes  PROCEDURE/FINDINGS:   Informed consent was obtained from the patient following explanation of the procedure, risks, benefits and alternatives. The patient understands, agrees and consents for the procedure. All questions were addressed. A time out was performed.  Maximal barrier sterile technique utilized including caps, mask, sterile gowns, sterile gloves, large sterile drape, hand hygiene, and betadine skin prep.  A planning axial CT scan was performed.  The right lower quadrant fluid and  contrast collection was successfully identified. A suitable skin entry site was selected and marked.  Local anesthesia was attained by infiltration of 1% lidocaine.  Using step in shoot CT guidance, and 18 gauge trocar needle was carefully advanced into the fluid collection.  Of 03/05 wire was coiled within the fluid collection.  The skin tract was serially dilated to 12-French and a 12-French Cook all-purpose drainage catheter was advanced over the wire and formed within the abscess collection.  A sample of fluid resembling enteric.  This was aspirated and sent for culture and analysis.  The tube was secured to the skin with O Prolene suture and a sterile bandage was  applied.  The tube was left to gravity drainage. There is no immediate complication.  The patient tolerated the procedure well.  IMPRESSION:  Placement of a 12-French percutaneous drain into the right lower quadrant abdominal abscess/fluid collection.  Aspirated material has an appearance suggestive of enteric sulcus.  A sample was sent for culture.  The catheter was left to gravity drainage.  Bulb suction was deferred given the high possibility of fistula formation.  Signed,  Sterling Big, MD Vascular & Interventional Radiologist [facility] Radiology   Original Report Authenticated By: Malachy Moan, M.D.    Microbiology: Recent Results (from the past 240 hour(s))  URINE CULTURE     Status: None   Collection Time    05/17/13  8:30 PM      Result Value Range Status   Specimen Description URINE, CLEAN CATCH   Final   Special Requests NONE   Final   Culture  Setup Time     Final   Value: 05/18/2013 07:26     Performed at [facility] Count     Final   Value: NO GROWTH     Performed at [facility]   Culture     Final   Value: NO GROWTH     Performed at [facility]   Report Status 05/19/2013 FINAL   Final     Labs: Basic Metabolic Panel:  Recent Labs Lab 05/17/13 2120  05/18/13 0535 05/19/13 2010 05/20/13 0400 05/21/13 0500 05/22/13 0540 05/23/13 0550 05/24/13 0500  NA  --   --  140 137 138 137 137 134* 136  K  --   < > 3.2* 3.6 4.1 4.8 4.3 3.8 4.0  CL  --   --  111 107 109 106 100 97 102  CO2  --   --  20 22 21 24 29 28 26   GLUCOSE  --   --  133* 88 86 88 99 100* 96  BUN  --   --  10 6 4* 6 6 7 12   CREATININE  --   --  0.74 0.63 0.55 0.66 0.78 0.89 0.96  CALCIUM  --   --  9.4 10.0 9.9 10.6* 10.8* 10.4 10.1  MG 1.8  --  1.6 1.5 1.4*  --   --   --   --   < > = values in this interval not displayed. Liver Function Tests: No results found for this basename: AST, ALT, ALKPHOS, BILITOT, PROT, ALBUMIN,  in the last 168 hours No  results found for this basename: LIPASE, AMYLASE,  in the last 168 hours No results found for this basename: AMMONIA,  in the last 168 hours CBC:  Recent Labs Lab 05/17/13 1828  05/22/13 2311 05/23/13 0550 05/23/13 1159 05/23/13 2340 05/24/13 0500  WBC 9.5  < >  4.9 5.4 7.0 7.5 7.5  NEUTROABS 6.0  --   --   --   --   --   --   HGB 10.2*  < > 10.0* 10.4* 10.5* 9.6* 9.4*  HCT 30.6*  < > 30.7* 31.1* 32.1* 28.5* 29.1*  MCV 79.9  < > 85.3 84.1 86.3 84.8 86.1  PLT 426*  < > 280 286 256 274 269  < > = values in this interval not displayed. Cardiac Enzymes: No results found for this basename: CKTOTAL, CKMB, CKMBINDEX, TROPONINI,  in the last 168 hours BNP: BNP (last 3 results)  Recent Labs  02/17/13 0500 05/07/13 0930 05/17/13 1831  PROBNP 1234.0* 34.8 826.2*   CBG: No results found for this basename: GLUCAP,  in the last 168 hours     Signed:  Mackenzi Krogh,CHRISTOPHER  [facility] 05/24/2013, 11:35 AM

## 2013-05-25 ENCOUNTER — Telehealth (INDEPENDENT_AMBULATORY_CARE_PROVIDER_SITE_OTHER): Payer: Self-pay

## 2013-05-25 NOTE — Telephone Encounter (Signed)
Patient is requesting a refill of percocet please advise  # [phone]

## 2013-05-25 NOTE — Telephone Encounter (Signed)
Patient denies temp, redness, odor, serous drainage at incision site  . Drain out put is 5cc light yellowish color. Pain has been about the same before and after her surgery per patient, rates 8-10 but the percocet relieves the pain; her stool is soft denies constipation. Dr. [name] has prescribed percocet 10/325 one po Q4hrs prn pain # 60

## 2013-05-26 ENCOUNTER — Ambulatory Visit (HOSPITAL_COMMUNITY): Payer: Self-pay

## 2013-05-27 ENCOUNTER — Telehealth: Payer: Self-pay | Admitting: Cardiovascular Disease

## 2013-06-01 ENCOUNTER — Encounter (INDEPENDENT_AMBULATORY_CARE_PROVIDER_SITE_OTHER): Payer: Self-pay | Admitting: General Surgery

## 2013-06-01 ENCOUNTER — Ambulatory Visit (INDEPENDENT_AMBULATORY_CARE_PROVIDER_SITE_OTHER): Payer: Medicaid Other | Admitting: General Surgery

## 2013-06-01 VITALS — BP 112/61 | HR 60 | Temp 97.1°F | Resp 12 | Ht 65.0 in | Wt 117.6 lb

## 2013-06-01 DIAGNOSIS — Z09 Encounter for follow-up examination after completed treatment for conditions other than malignant neoplasm: Secondary | ICD-10-CM

## 2013-06-01 NOTE — Addendum Note (Signed)
Addended by: Frederik Schmidt on: 06/01/2013 12:14 PM   Modules accepted: Orders

## 2013-06-01 NOTE — Progress Notes (Signed)
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 She still has a drain in her right upper quadrant which is draining minimal amounts and in her back currently she only has maybe 2-3 cc over the last 2 days.  Her abdomen is soft and nontender and has good bowel sounds. 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 Her midline incision is healing well with a small amount of granulation tissue opened in the lower midline.  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 In order to assess this the patient will need a CT scan of the abdomen and pelvis with contrast and subsequently an injection into the fistulous draining to see if there is any connection with the bowel. We're going to go ahead and set this up as soon as possible and I will see the patient back at my next available clinic appointment to pull her drain if the studies are clear.  40 Percocet tablets have been prescribed.

## 2013-06-03 ENCOUNTER — Ambulatory Visit (HOSPITAL_COMMUNITY)
Admission: RE | Admit: 2013-06-03 | Discharge: 2013-06-03 | Disposition: A | Discharge status: Home / Self Care | Payer: Medicaid Other | Source: Ambulatory Visit | Attending: General Surgery | Admitting: General Surgery

## 2013-06-03 ENCOUNTER — Encounter (HOSPITAL_COMMUNITY): Payer: Self-pay

## 2013-06-03 ENCOUNTER — Telehealth (INDEPENDENT_AMBULATORY_CARE_PROVIDER_SITE_OTHER): Payer: Self-pay | Admitting: *Deleted

## 2013-06-03 ENCOUNTER — Other Ambulatory Visit (INDEPENDENT_AMBULATORY_CARE_PROVIDER_SITE_OTHER): Payer: Self-pay | Admitting: General Surgery

## 2013-06-03 ENCOUNTER — Telehealth (INDEPENDENT_AMBULATORY_CARE_PROVIDER_SITE_OTHER): Payer: Self-pay

## 2013-06-03 DIAGNOSIS — Z933 Colostomy status: Secondary | ICD-10-CM | POA: Insufficient documentation

## 2013-06-03 DIAGNOSIS — L0291 Cutaneous abscess, unspecified: Secondary | ICD-10-CM

## 2013-06-03 DIAGNOSIS — Z09 Encounter for follow-up examination after completed treatment for conditions other than malignant neoplasm: Secondary | ICD-10-CM

## 2013-06-03 DIAGNOSIS — Z8541 Personal history of malignant neoplasm of cervix uteri: Secondary | ICD-10-CM | POA: Insufficient documentation

## 2013-06-03 DIAGNOSIS — K632 Fistula of intestine: Secondary | ICD-10-CM | POA: Insufficient documentation

## 2013-06-03 DIAGNOSIS — Z9089 Acquired absence of other organs: Secondary | ICD-10-CM | POA: Insufficient documentation

## 2013-06-03 MED ORDER — IOHEXOL 300 MG/ML  SOLN
80.0000 mL | Freq: Once | INTRAMUSCULAR | Status: AC | PRN
  Administered 2013-06-03: 80 mL via INTRAVENOUS

## 2013-06-03 MED ORDER — IOHEXOL 300 MG/ML  SOLN
50.0000 mL | Freq: Once | INTRAMUSCULAR | Status: AC | PRN
  Administered 2013-06-03: 25 mL via INTRAVENOUS

## 2013-06-03 NOTE — Telephone Encounter (Signed)
Vanessa [name] at [facility] CT called stating pt was in CT to have fistula evaluated and pt was directed to IR per radiology to have fistula study and then will proceed with CT scan. She wanted Dr [name] to be aware of this since order was placed for CT to do procedure and Ct dept does not do fistula studies. Radiology corrected test in epic.

## 2013-06-03 NOTE — Telephone Encounter (Signed)
Received call from [place] PA at [facility] radiology who states CT shows drain is able to be removed, asking if are approved to remove it or whether patient would need appt to come see Dr. [name].  Dr. Dixon Boos note states that if scans are clear then patient needs to come in and he will remove drain.  Read note to [facility] and stated that per Dr. Dixon Boos note patient needs to come into the office to have the drain removed.  Katrine PA states understanding at this time and will let the patient know.

## 2013-06-07 ENCOUNTER — Telehealth (HOSPITAL_COMMUNITY): Payer: Self-pay | Admitting: *Deleted

## 2013-06-07 ENCOUNTER — Ambulatory Visit: Payer: Self-pay | Admitting: Pharmacist Clinician (PhC)/ Clinical Pharmacy Specialist

## 2013-06-07 DIAGNOSIS — Z7901 Long term (current) use of anticoagulants: Secondary | ICD-10-CM

## 2013-06-10 ENCOUNTER — Encounter (INDEPENDENT_AMBULATORY_CARE_PROVIDER_SITE_OTHER): Payer: Medicaid Other | Admitting: General Surgery

## 2013-06-11 ENCOUNTER — Ambulatory Visit: Payer: Medicaid Other | Admitting: Cardiovascular Disease

## 2013-06-11 ENCOUNTER — Telehealth (INDEPENDENT_AMBULATORY_CARE_PROVIDER_SITE_OTHER): Payer: Self-pay | Admitting: *Deleted

## 2013-06-11 NOTE — Telephone Encounter (Signed)
Dr. [name] called back to state that he needs to be able to assess the patient before he can determine the appropriateness of refilling pain medication.  Since patient no showed for her appt yesterday she will have to wait until the reschedule appt next week before Dr. [name] feels comfortable with prescribing more medication.  Patient updated at this time.

## 2013-06-11 NOTE — Telephone Encounter (Signed)
Patient called to reschedule her PO appt.  Patient states she had a lot of different appts yesterday and got confused by them all.  Patient also asking for a refill of her Percocet 10/325mg  pain medication.  Appt was rescheduled for 06/15/13.  Explained that I would have to get approval for the refill from Dr. [name] then I will let her know.  Patient states understanding and agreeable at this time.

## 2013-06-15 ENCOUNTER — Ambulatory Visit (INDEPENDENT_AMBULATORY_CARE_PROVIDER_SITE_OTHER): Payer: Medicaid Other | Admitting: General Surgery

## 2013-06-15 ENCOUNTER — Encounter (INDEPENDENT_AMBULATORY_CARE_PROVIDER_SITE_OTHER): Payer: Self-pay | Admitting: General Surgery

## 2013-06-15 VITALS — BP 104/68 | HR 92 | Temp 98.7°F | Resp 14 | Ht 65.0 in | Wt 122.0 lb

## 2013-06-15 DIAGNOSIS — T8183XA Persistent postprocedural fistula, initial encounter: Secondary | ICD-10-CM

## 2013-06-15 DIAGNOSIS — K632 Fistula of intestine: Secondary | ICD-10-CM

## 2013-06-15 MED ORDER — OXYCODONE-ACETAMINOPHEN 10-325 MG PO TABS: 1.0000 | ORAL_TABLET | ORAL | Status: DC | PRN

## 2013-06-15 NOTE — Progress Notes (Signed)
The patient was doing very well. Her last CT scan demonstrated no fistulization and no abscess. There is only approximately 5 cc in her drainage bag which has been there for approximately one week.  I was able to remove the percutaneous drain totally with all ports being removed with it. Those minimal discomfort. The patient is still complaining of some generalized abdominal pain but she looks very healthy and is afebrile in our office. Her pulse is 92 her blood pressures normal.  Aware we prescribed the patient 40 tablets of Percocet tens and see her again in 3 weeks. At that time expect that she should be doing well enough to not require any preacher pain medication I will send the patient for CBC today to followup her most recent one which was done on September 1.

## 2013-07-02 ENCOUNTER — Telehealth (INDEPENDENT_AMBULATORY_CARE_PROVIDER_SITE_OTHER): Payer: Self-pay
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
## 2013-07-06 ENCOUNTER — Telehealth (INDEPENDENT_AMBULATORY_CARE_PROVIDER_SITE_OTHER): Payer: Self-pay | Admitting: *Deleted

## 2013-07-06 ENCOUNTER — Ambulatory Visit (INDEPENDENT_AMBULATORY_CARE_PROVIDER_SITE_OTHER): Payer: Medicaid Other | Admitting: General Surgery

## 2013-07-06 ENCOUNTER — Encounter (INDEPENDENT_AMBULATORY_CARE_PROVIDER_SITE_OTHER): Payer: Self-pay

## 2013-07-06 ENCOUNTER — Encounter (INDEPENDENT_AMBULATORY_CARE_PROVIDER_SITE_OTHER): Payer: Self-pay | Admitting: General Surgery

## 2013-07-06 VITALS — BP 120/80 | HR 68 | Temp 97.8°F | Resp 14 | Ht 65.0 in | Wt 120.4 lb

## 2013-07-06 DIAGNOSIS — N739 Female pelvic inflammatory disease, unspecified: Secondary | ICD-10-CM | POA: Insufficient documentation

## 2013-07-06 DIAGNOSIS — N731 Chronic parametritis and pelvic cellulitis: Secondary | ICD-10-CM

## 2013-07-06 MED ORDER — OXYCODONE-ACETAMINOPHEN 10-325 MG PO TABS: 1.0000 | ORAL_TABLET | ORAL | Status: DC | PRN

## 2013-07-06 NOTE — Progress Notes (Signed)
The patient is doing well although she reports having some rectal drainage at least 3-4 times a day which appears to be pus. This has only occurred since she had her percutaneous drain removed. She is afebrile and otherwise feels okay but does have some chronic lower abdominal pain. She has no problems urinating.  A digital rectal examination did not demonstrate any fluctuant mass in her pelvis although it was uncomfortable. There was no drainage of pus with the digital exam. However get a CT scan of the abdomen and pelvis to help delineate what process is going on in the pelvis. I'll see the patient back in 2-3 weeks.

## 2013-07-06 NOTE — Telephone Encounter (Signed)
LMOM for pt to return my call.  I was calling to inform her of her appt for CT at [facility] on 07/09/13 with an arrival time of 8:45 am.  She needs to pick contrast up on 07/08/13.  Pt should not have any solid foods 4 hours prior to scan.  Pt needs to drink 1st bottle of contrast at 7:00am and 2nd bottle at 8:00am.

## 2013-07-07 ENCOUNTER — Telehealth (INDEPENDENT_AMBULATORY_CARE_PROVIDER_SITE_OTHER): Payer: Self-pay | Admitting: *Deleted

## 2013-07-07 NOTE — Telephone Encounter (Signed)
LMOM for pt to return my call to give her CT appt info listed below. Will try again tomorrow.

## 2013-07-08 ENCOUNTER — Telehealth (INDEPENDENT_AMBULATORY_CARE_PROVIDER_SITE_OTHER): Payer: Self-pay | Admitting: *Deleted

## 2013-07-08 NOTE — Telephone Encounter (Signed)
Spoke with pt and she stated she is aware of her appt tomorrow for her CT scan at 8:45am at [facility] location.

## 2013-07-09 ENCOUNTER — Inpatient Hospital Stay: Admission: RE | Admit: 2013-07-09 | Payer: Medicaid Other | Source: Ambulatory Visit

## 2013-07-14 ENCOUNTER — Telehealth (INDEPENDENT_AMBULATORY_CARE_PROVIDER_SITE_OTHER): Payer: Self-pay

## 2013-07-14 NOTE — Telephone Encounter (Signed)
Spoke to pt and moved her appt to 11/5 at 2:10

## 2013-07-15 ENCOUNTER — Ambulatory Visit
Admission: RE | Admit: 2013-07-15 | Discharge: 2013-07-15 | Disposition: A | Discharge status: Home / Self Care | Payer: Medicaid Other | Source: Ambulatory Visit | Attending: General Surgery | Admitting: General Surgery

## 2013-07-15 ENCOUNTER — Ambulatory Visit (HOSPITAL_COMMUNITY): Payer: Medicaid Other

## 2013-07-15 DIAGNOSIS — N739 Female pelvic inflammatory disease, unspecified: Secondary | ICD-10-CM

## 2013-07-15 MED ORDER — IOHEXOL 300 MG/ML  SOLN
100.0000 mL | Freq: Once | INTRAMUSCULAR | Status: AC | PRN
  Administered 2013-07-15: 100 mL via INTRAVENOUS

## 2013-07-16 ENCOUNTER — Telehealth (INDEPENDENT_AMBULATORY_CARE_PROVIDER_SITE_OTHER): Payer: Self-pay

## 2013-07-16 DIAGNOSIS — N739 Female pelvic inflammatory disease, unspecified: Secondary | ICD-10-CM

## 2013-07-16 NOTE — Telephone Encounter (Signed)
Called pt to notify her that Dr [name] is going to give her #20 Percocet 10/325 only for pain but she needs to take one every 6 hours as needed for pain control. I notified pt that her CT shows no evidence of a fistuala or abscess. I advised pt that Dr [name] wants pt to get a CBC done when she picks up the rx she should go to [facility] to get the CBC done today. The pt will p/u rx and go get labs drawn today.

## 2013-07-16 NOTE — Telephone Encounter (Signed)
Pt called requesting refill of oxycodone. Pt states pain is 8 out of 10 and constant. No fever or nausea. Voiding well. BM today normal. Eating well. Pt aware this will require up to 24 hr response and request will be sent to Dr [name] and Marcelino Duster to review.

## 2013-07-19 LAB — CBC WITH DIFFERENTIAL/PLATELET
Eosinophils Absolute: 0.1 10*3/uL (ref 0.0–0.7)
Hemoglobin: 11.4 g/dL — ABNORMAL LOW (ref 12.0–15.0)
Lymphocytes Relative: 35 % (ref 12–46)
Lymphs Abs: 2.1 10*3/uL (ref 0.7–4.0)
Monocytes Relative: 9 % (ref 3–12)
Neutro Abs: 3.1 10*3/uL (ref 1.7–7.7)
Neutrophils Relative %: 53 % (ref 43–77)
Platelets: 408 10*3/uL — ABNORMAL HIGH (ref 150–400)
RBC: 4.16 MIL/uL (ref 3.87–5.11)
WBC: 6 10*3/uL (ref 4.0–10.5)

## 2013-07-27 ENCOUNTER — Ambulatory Visit (INDEPENDENT_AMBULATORY_CARE_PROVIDER_SITE_OTHER): Payer: Medicaid Other | Admitting: General Surgery

## 2013-07-27 DIAGNOSIS — Z09 Encounter for follow-up examination after completed treatment for conditions other than malignant neoplasm: Secondary | ICD-10-CM

## 2013-07-28 ENCOUNTER — Ambulatory Visit (INDEPENDENT_AMBULATORY_CARE_PROVIDER_SITE_OTHER): Payer: Medicaid Other | Admitting: General Surgery

## 2013-07-28 ENCOUNTER — Encounter (INDEPENDENT_AMBULATORY_CARE_PROVIDER_SITE_OTHER): Payer: Self-pay | Admitting: General Surgery

## 2013-07-28 VITALS — BP 132/70 | HR 84 | Resp 20 | Ht 64.0 in | Wt 126.2 lb

## 2013-07-28 DIAGNOSIS — Z09 Encounter for follow-up examination after completed treatment for conditions other than malignant neoplasm: Secondary | ICD-10-CM

## 2013-07-28 DIAGNOSIS — N731 Chronic parametritis and pelvic cellulitis: Secondary | ICD-10-CM

## 2013-07-28 DIAGNOSIS — N739 Female pelvic inflammatory disease, unspecified: Secondary | ICD-10-CM

## 2013-07-28 MED ORDER — METRONIDAZOLE 500 MG PO TABS: 500.0000 mg | ORAL_TABLET | Freq: Three times a day (TID) | ORAL | Status: AC

## 2013-07-28 MED ORDER — CIPROFLOXACIN HCL 500 MG PO TABS: 500.0000 mg | ORAL_TABLET | Freq: Two times a day (BID) | ORAL | Status: AC

## 2013-07-28 MED ORDER — OXYCODONE-ACETAMINOPHEN 10-325 MG PO TABS: 1.0000 | ORAL_TABLET | ORAL | Status: DC | PRN

## 2013-07-28 NOTE — Progress Notes (Signed)
The patient comes in complaining of continued lower abdominal pain. She is afebrile and vital signs are stable. The midline wound is well-healed. Her colostomy in the left lower quadrant is normal in size and is no longer prolapse.  I did not perform a rectal examination on the patient today. She states that she is still having intermittent stool-like drainage from her rectum and occasionally some blood.  Because of these findings I would treat the patient with 10 days of oral antibiotics including ciprofloxacin and Flagyl. Her CT scan of her abdomen and pelvis did not demonstrate any evidence of recurrent abscess however with her symptoms I will treat her in hopes that this may decrease the drainage to make it go away.  To see the patient back in 2 weeks. She just informed me today that she is moving to [place]. Will attempt to find her a colorectal surgeon in that area for followup.

## 2013-08-10 ENCOUNTER — Encounter (INDEPENDENT_AMBULATORY_CARE_PROVIDER_SITE_OTHER): Payer: Self-pay | Admitting: General Surgery

## 2013-08-10 ENCOUNTER — Ambulatory Visit (INDEPENDENT_AMBULATORY_CARE_PROVIDER_SITE_OTHER): Payer: Medicaid Other | Admitting: General Surgery

## 2013-08-10 VITALS — BP 150/90 | HR 80 | Temp 99.2°F | Resp 15 | Ht 65.0 in | Wt 126.8 lb

## 2013-08-10 DIAGNOSIS — Z09 Encounter for follow-up examination after completed treatment for conditions other than malignant neoplasm: Secondary | ICD-10-CM

## 2013-08-10 MED ORDER — OXYCODONE-ACETAMINOPHEN 10-325 MG PO TABS: 1.0000 | ORAL_TABLET | ORAL | Status: AC | PRN

## 2013-08-10 NOTE — Progress Notes (Signed)
The patient states that she was doing very well until yesterday evening when she developed some cramping abdominal pain. She continues to have some discomfort in her abdomen particularly on the right side.  There is very little objectively noted on her examination today. Her left-sided colostomy is working fine and is not protruding her prolapse. Her abdomen is soft although she does have some tenderness on the right side. She has good normal bowel sounds. This will be the patient's last visit to see me. I will try to find her surgeon in [place] for her followup. All were prescribed her additional pain medications for this last visit. This is for the cramping abdominal pain That the patient has had.

## 2013-11-09 NOTE — Telephone Encounter (Signed)
NO visit

## 2013-11-09 NOTE — Progress Notes (Signed)
Patient canceled this appointment and did not show.

## 2013-11-27 IMAGING — CT CT ABD-PELV W/O CM
2 of 4 series · 16 of 46 positions shown, 18 images · non-contrast
Comparison: 05/03/2013

CLINICAL DATA: Abscess

CT ABDOMEN AND PELVIS WITHOUT CONTRAST
TECHNIQUE: Multidetector CT imaging of the abdomen and pelvis was
performed following the standard protocol without intravenous
contrast.

[Series 2: abd/ pelvis 5.0 i30f 1 · axial · 0.71mm/px · z∈[+796,+1216]mm · 13 of 92 slices shown, 15 images]
[im 4/92  soft-tissue]
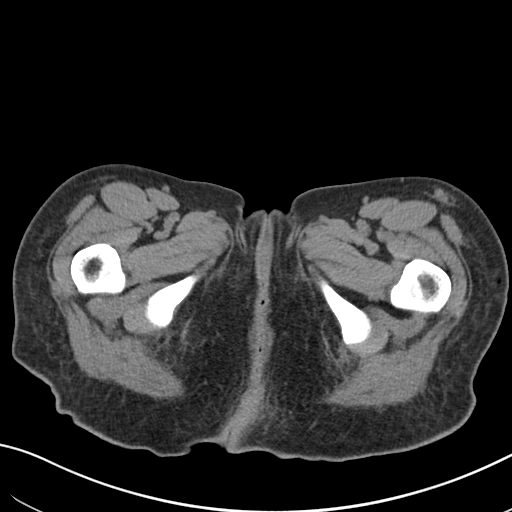
[im 4/92  bone]
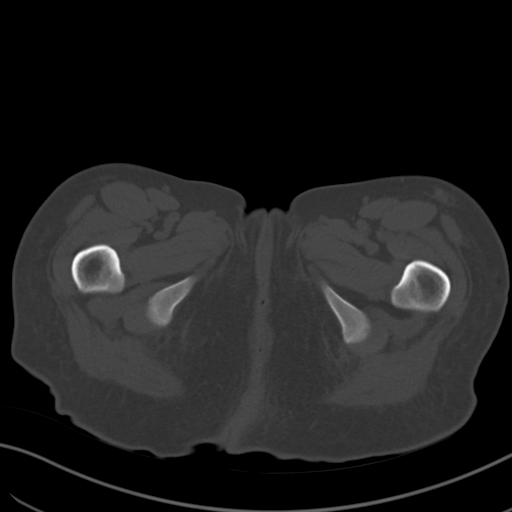
[im 12/92  soft-tissue]
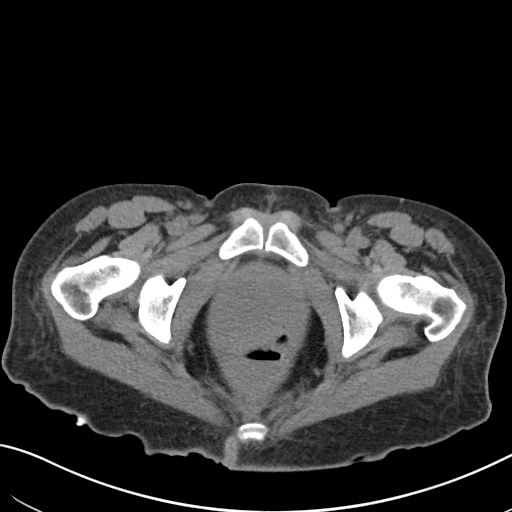
[im 19/92  soft-tissue]
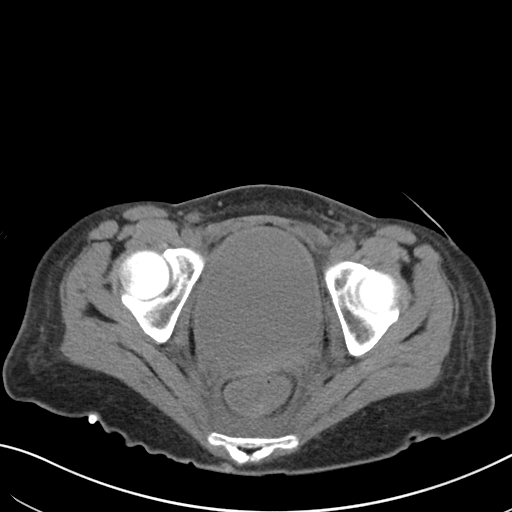
[im 27/92  soft-tissue]
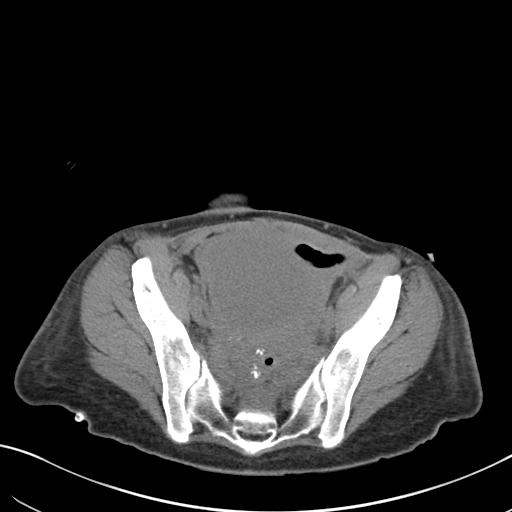
[im 31/92  soft-tissue]
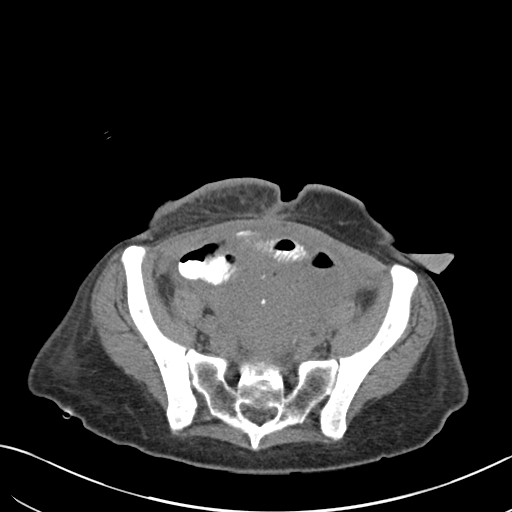
[im 38/92  soft-tissue]
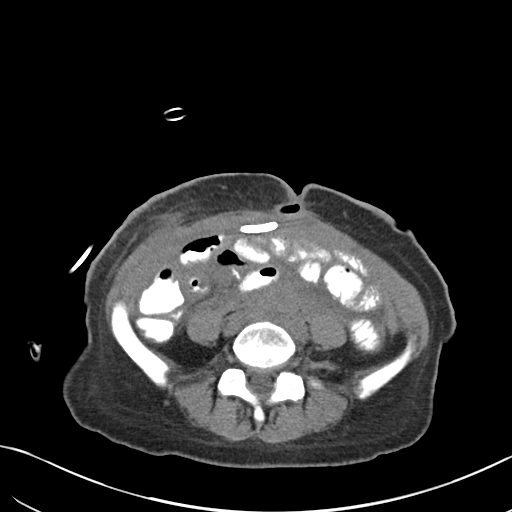
[im 46/92  soft-tissue]
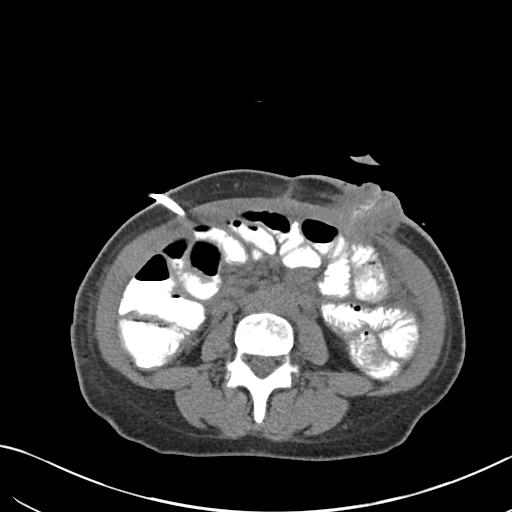
[im 54/92  soft-tissue]
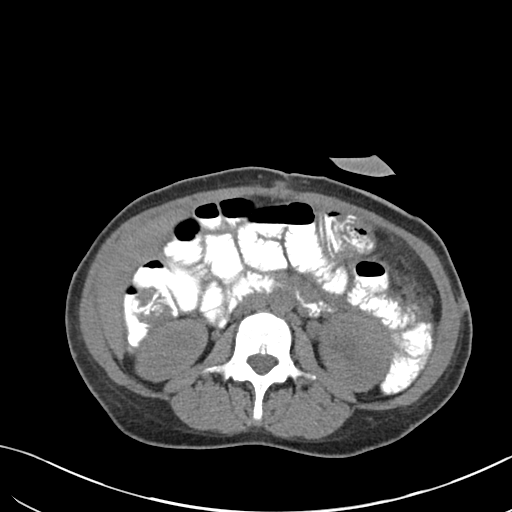
[im 61/92  soft-tissue]
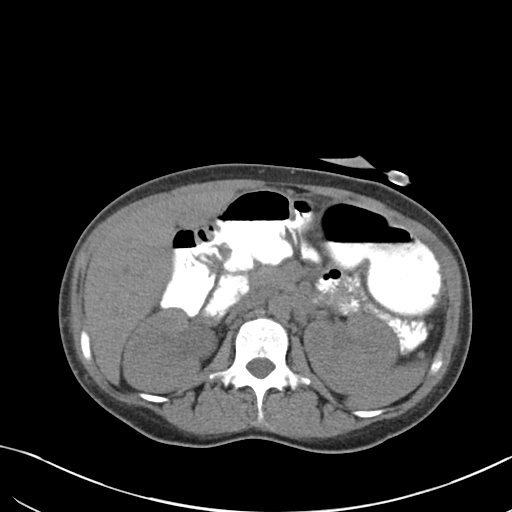
[im 61/92  bone]
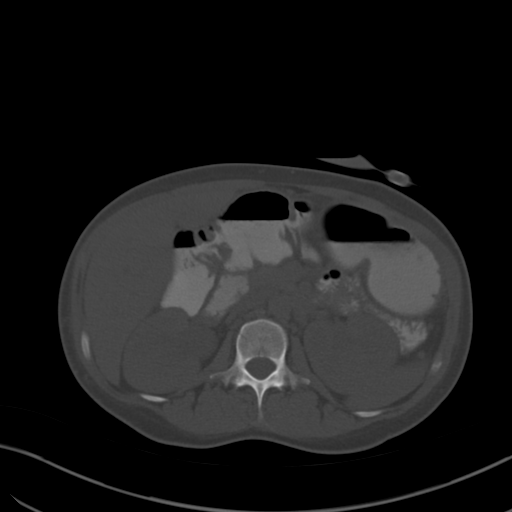
[im 65/92  soft-tissue]
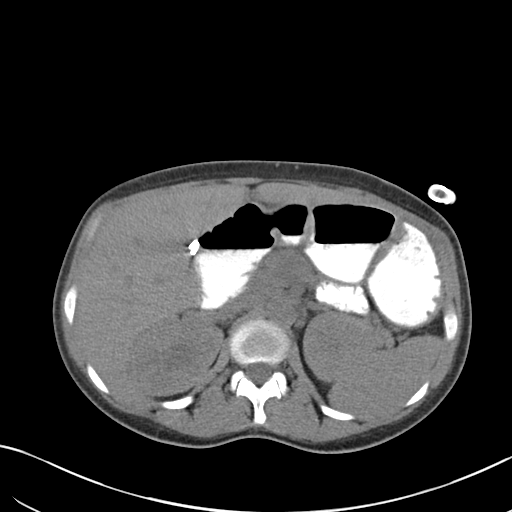
[im 73/92  soft-tissue]
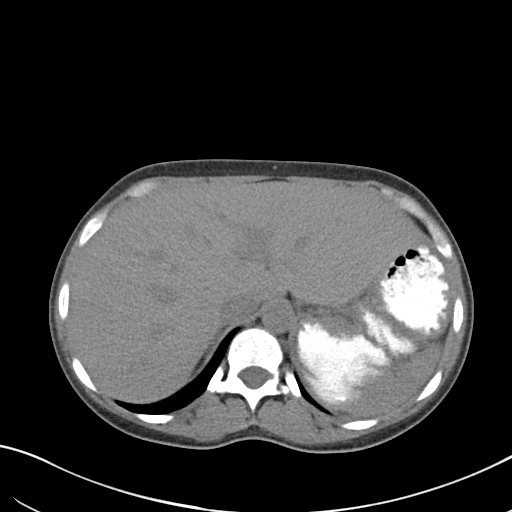
[im 80/92  soft-tissue]
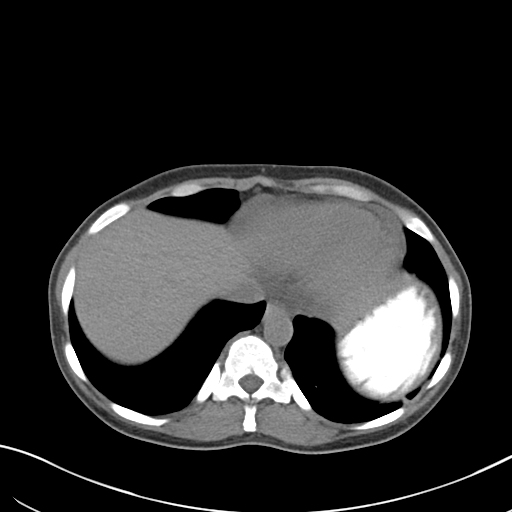
[im 88/92  soft-tissue]
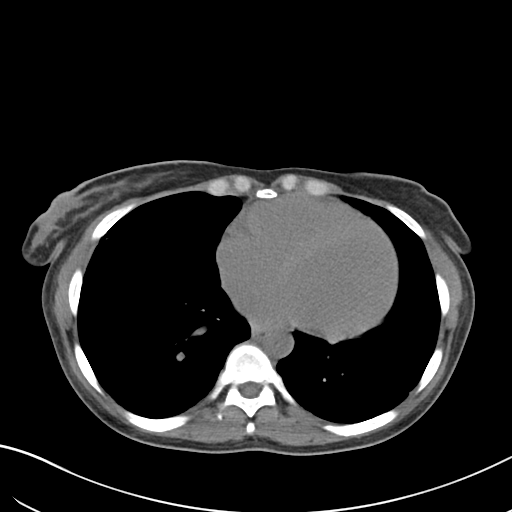

[Series 5: cor st · coronal · 0.89mm/px · 3 of 110 slices shown]
[im 37/110  soft-tissue]
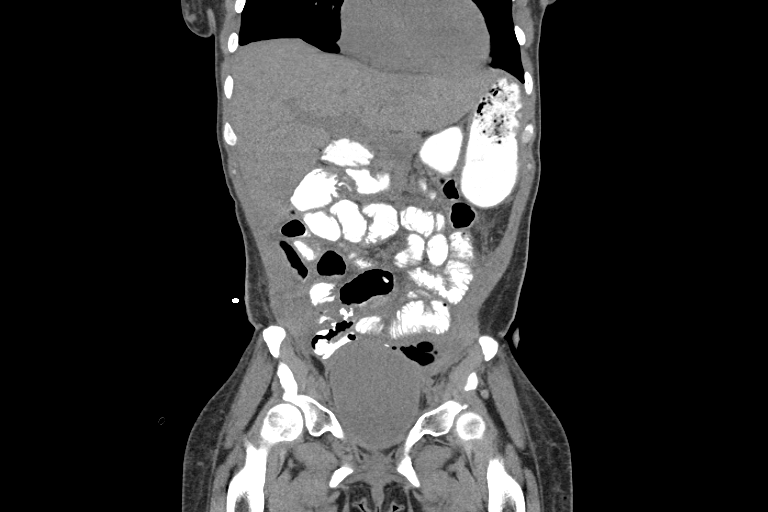
[im 49/110  soft-tissue]
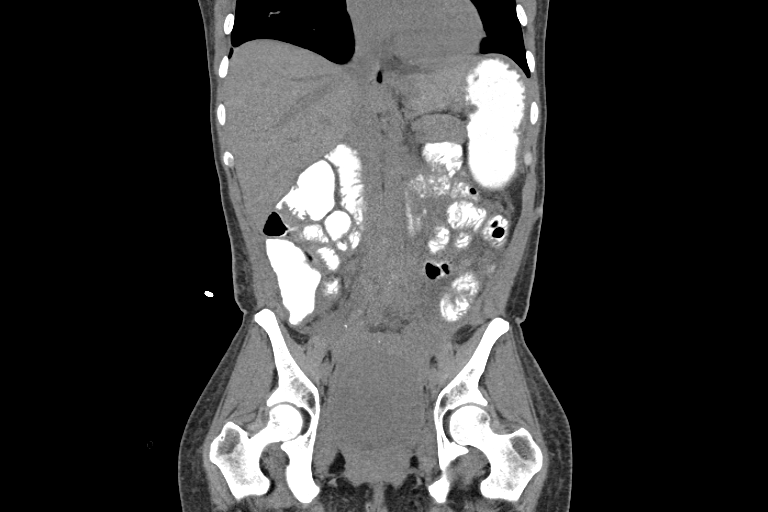
[im 61/110  soft-tissue]
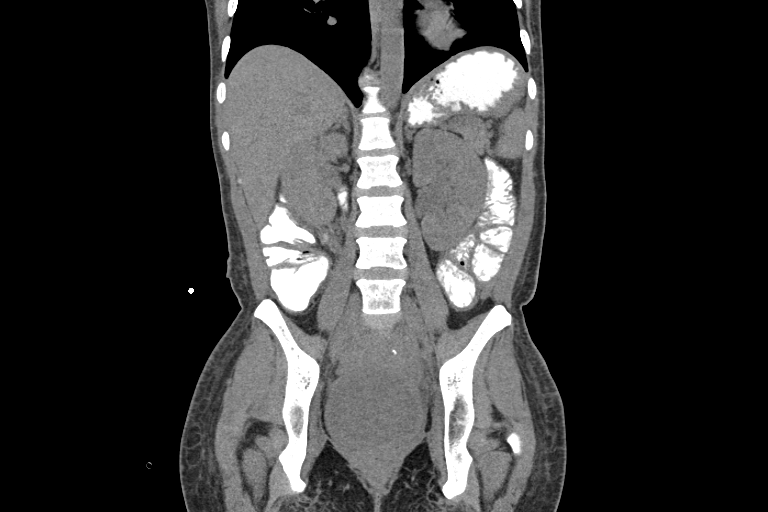

[16 of 46 positions shown; findings below may reference images not displayed]

FINDINGS: Lung bases, liver, spleen, pancreas, adrenal glands are
within normal limits.  Post cholecystectomy.

Stable appearance of the kidneys.  Tiny calculus in the lower pole
of the left kidney.

The right sided abdominal abscess drain is stable in position
within the anterior peritoneal space.  No residual abscess.

Small amount of gas adjacent to the emboli this is stable.

Left pelvic gas and fluid-filled abscess measuring 2.2 x 4.5 cm is
not significantly changed.  There is no enteric contrast within
this abscess cavity.

Bladder is distended.

Perirectal edema within the presacral fat is stable.

No disproportionate dilatation of small bowel to suggest
obstruction.  Previously noted small bowel distention has subsided.
IMPRESSION: There is no evidence of enteric contrast within the left low pelvic
abscess.

Small bowel distention has improved.

## 2013-12-10 IMAGING — CT CT ANGIO CHEST
3 of 12 series · 14 of 37 positions shown · IV contrast (omnipaque)
Comparison: 05/04/2013 abdominal CT, 08/06/2010 PET CT

CHEST:

CLINICAL DATA: Elevated D-dimer, abdominal pain

CT ANGIOGRAPHY CHEST,CT ABDOMEN AND PELVIS WITH CONTRAST
TECHNIQUE: Multidetector CT imaging of the chest using the
standard protocol during bolus administration of intravenous
contrast. Multiplanar reconstructed images including MIPs were
obtained and reviewed to evaluate the vascular anatomy.,Technique:
Mul
Contrast: 80mL OMNIPAQUE IOHEXOL 350 MG/ML SOLN

[Series 5: pe · axial · 0.68mm/px · z∈[+327,+507]mm · 4 of 150 slices shown]
[im 30/150  lung]
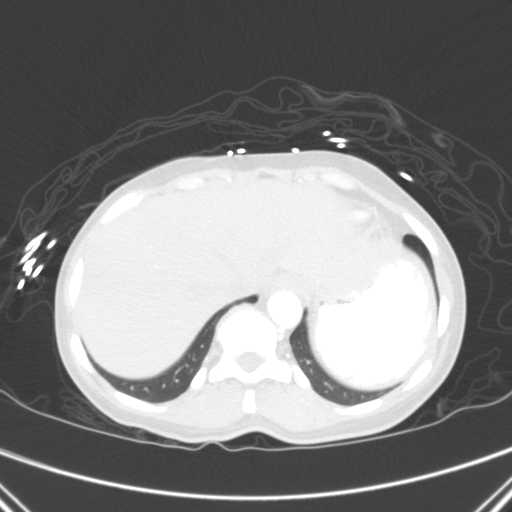
[im 60/150  lung]
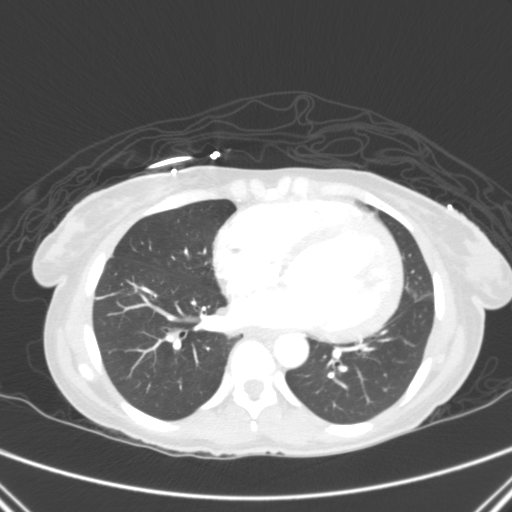
[im 90/150  lung]
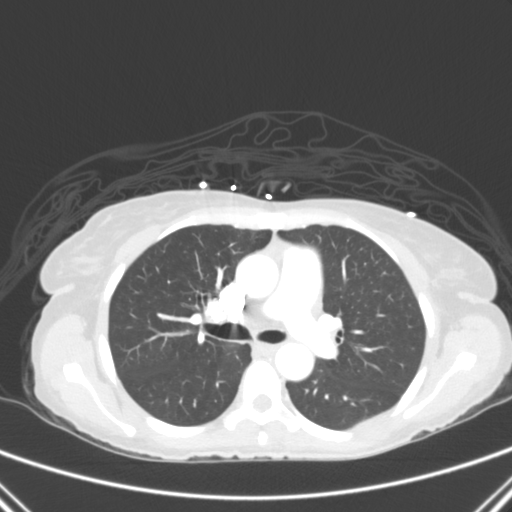
[im 120/150  lung]
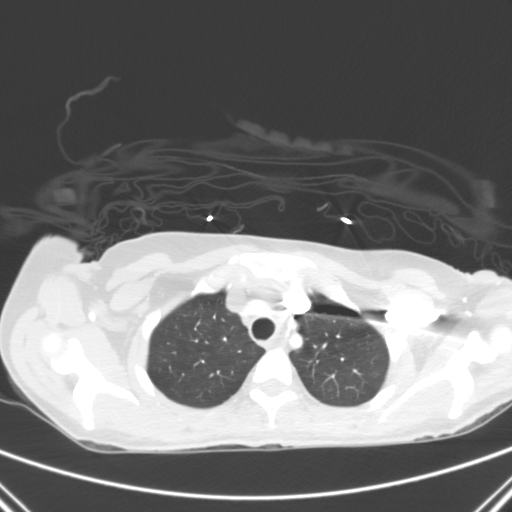

[Series 6: pe thins · axial · 0.68mm/px · z∈[+291,+544]mm · 8 of 299 slices shown]
[im 23/299  lung]
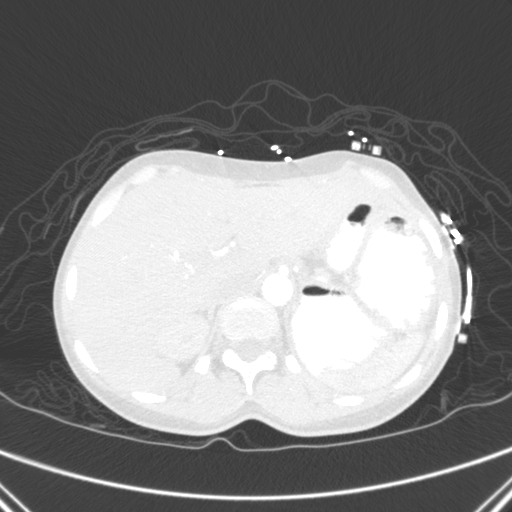
[im 69/299  lung]
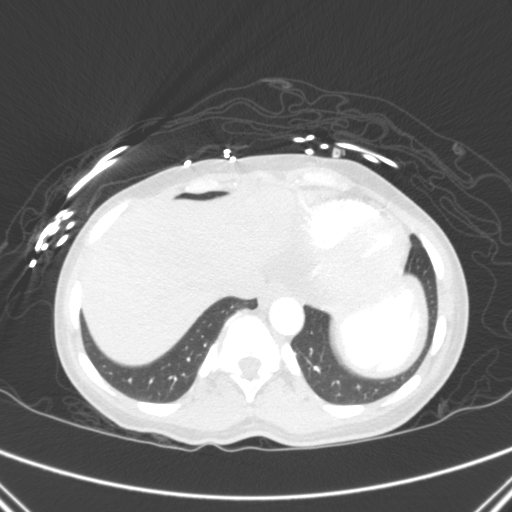
[im 92/299  lung]
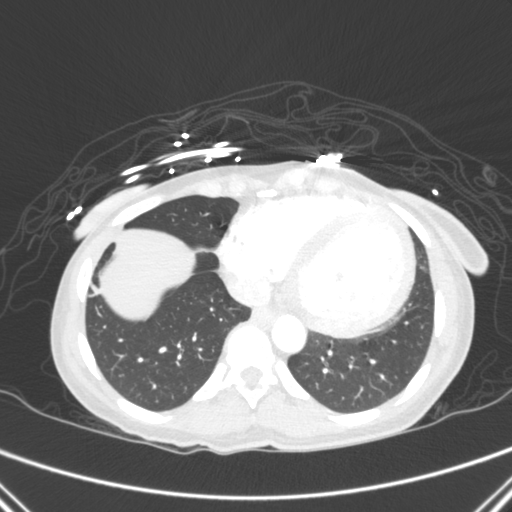
[im 138/299  lung]
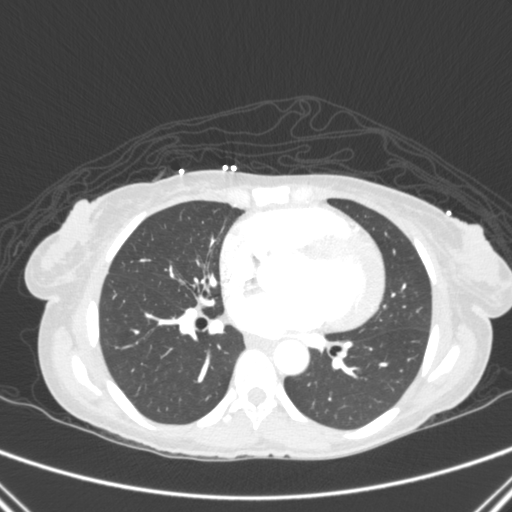
[im 161/299  lung]
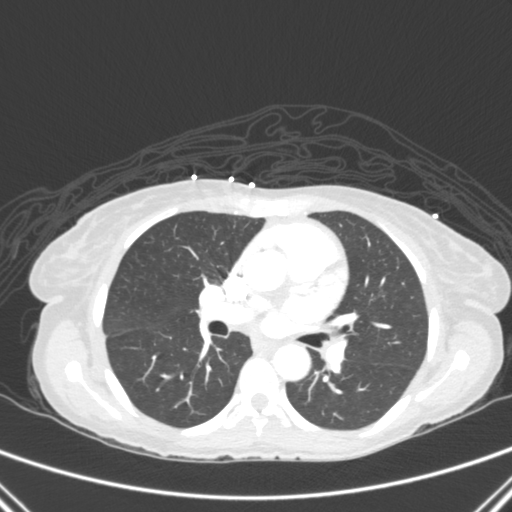
[im 207/299  lung]
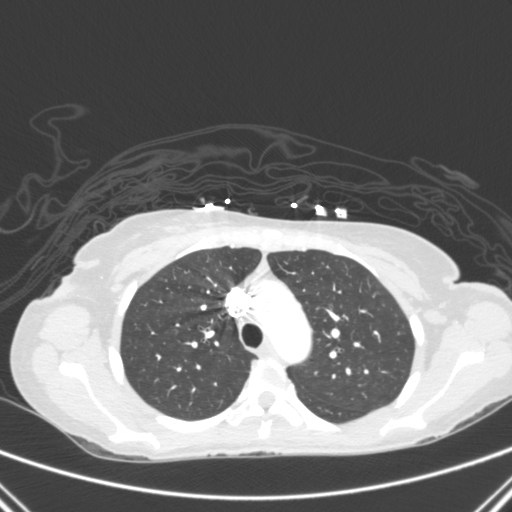
[im 230/299  lung]
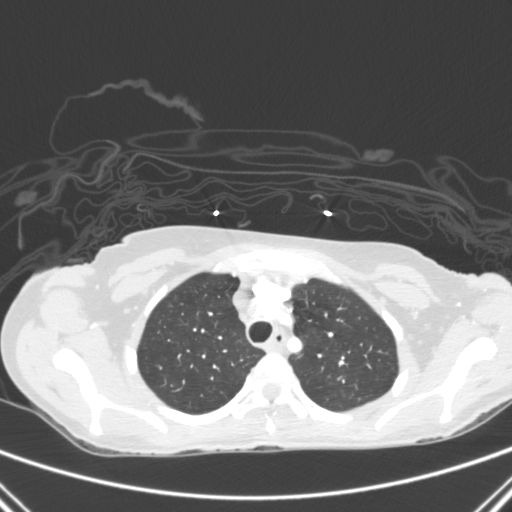
[im 276/299  lung]
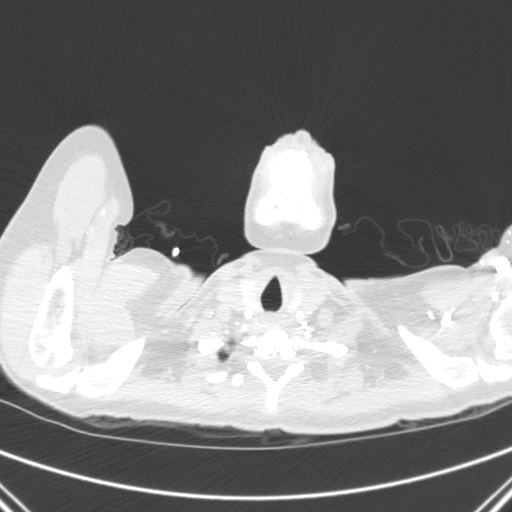

[Series 8: routine · axial · 0.68mm/px · z∈[+98,+238]mm · 2 of 86 slices shown]
[im 29/86  lung]
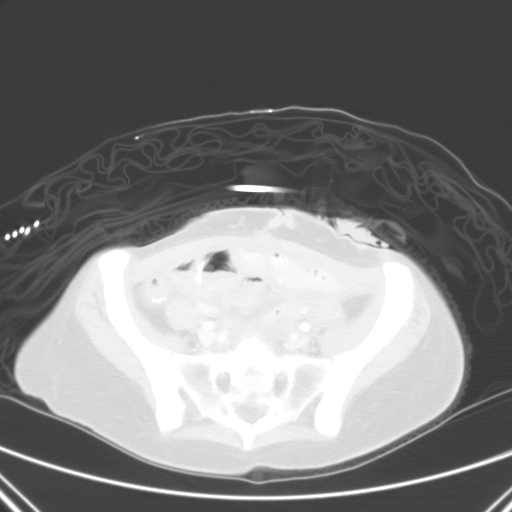
[im 57/86  mediastinal]
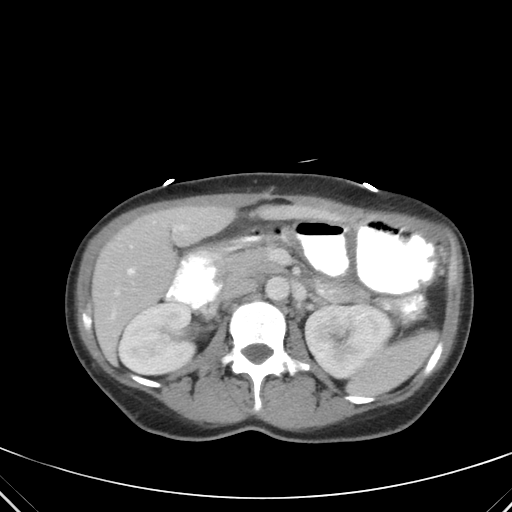

[14 of 37 positions shown; findings below may reference images not displayed]

FINDINGS: Pulmonary arteries are patent.  Normal caliber aorta and
branch vessels.  Cardiomegaly.  No pleural or pericardial effusion.
Mildly prominent left axillary lymph node measuring 1 cm short axis
on image 93 series 6. This has developed since the prior PET CT.
Otherwise, no intrathoracic lymphadenopathy.  Central airways are
patent.  No pneumothorax.  Linear opacity within the right lower
lobe, favored to reflect atelectasis or scar.  Unchanged 3 mm
nodule right lung base, may be vascular. No acute osseous finding.
IMPRESSION: No pulmonary embolism or acute intrathoracic process.

Nonspecific enlarged left axillary lymph node.  Consider tissue
sampling.

Mild cardiomegaly/left ventricular enlargement.  Correlate with
echocardiogram.

ABDOMEN/PELVIS
FINDINGS: Unremarkable liver, spleen, pancreas.  Cholecystectomy.
No biliary ductal dilatation.  Unremarkable adrenal glands.

Symmetric renal enhancement.  Nonobstructing lower pole stone on
the left.

Pancolitis and left lower quadrant colostomy.  No bowel
obstruction.  Surgical drain terminates within the midline anterior
abdomen.  Complex peripherally enhancing collection within the
pelvis tracks superiorly, inseparable from adjacent loops of bowel.
The amount of extraluminal gas has decreased.  Surgical anastomotic
suture right lower quadrant. Open midline laparotomy.

Normal caliber aorta and branch vessels.

Circumferential bladder wall thickening.  Intraluminal high
attenuation presumably reflects clot.  Uterus is presumably absent
with irregularly enhancing soft tissue within the pelvis, similar
to prior.

No interval osseous change.
IMPRESSION: Postoperative changes of left lower quadrant colostomy. Nonspecific
pancolitis.  No small bowel obstruction.

Pelvic abscess persists though has decreased in size, with the
largest component measuring 2.1 x 1.1 cm within the left anterior
pelvis.

Circumferential bladder wall thickening.  Interval intraluminal
high attenuation presumably reflects blood clot.  Correlate with
urinalysis.  May reflect cystitis including radiation cystitis.

Uterus is presumably absent with irregularly enhancing soft tissue
within the pelvis, similar to prior.

## 2014-07-25 ENCOUNTER — Encounter (INDEPENDENT_AMBULATORY_CARE_PROVIDER_SITE_OTHER): Payer: Self-pay | Admitting: General Surgery

## 2014-10-06 ENCOUNTER — Encounter (HOSPITAL_COMMUNITY): Payer: Self-pay | Admitting: General Surgery

## 2014-11-22 DEATH — deceased
